# Patient Record
Sex: Female | Born: 1960
Health system: Southern US, Community
[De-identification: ages and names within clinical notes are randomized; demographics above are authoritative.]

## PROBLEM LIST (undated history)

## (undated) DIAGNOSIS — F99 Mental disorder, not otherwise specified: Secondary | ICD-10-CM

## (undated) DIAGNOSIS — Z8719 Personal history of other diseases of the digestive system: Secondary | ICD-10-CM

## (undated) DIAGNOSIS — N182 Chronic kidney disease, stage 2 (mild): Secondary | ICD-10-CM

## (undated) DIAGNOSIS — R519 Headache, unspecified: Secondary | ICD-10-CM

## (undated) DIAGNOSIS — R197 Diarrhea, unspecified: Secondary | ICD-10-CM

## (undated) DIAGNOSIS — K859 Acute pancreatitis without necrosis or infection, unspecified: Secondary | ICD-10-CM

## (undated) DIAGNOSIS — D649 Anemia, unspecified: Secondary | ICD-10-CM

## (undated) DIAGNOSIS — M51369 Other intervertebral disc degeneration, lumbar region without mention of lumbar back pain or lower extremity pain: Secondary | ICD-10-CM

## (undated) DIAGNOSIS — M71311 Other bursal cyst, right shoulder: Secondary | ICD-10-CM

## (undated) DIAGNOSIS — M317 Microscopic polyangiitis: Secondary | ICD-10-CM

## (undated) DIAGNOSIS — G4730 Sleep apnea, unspecified: Secondary | ICD-10-CM

## (undated) DIAGNOSIS — M75121 Complete rotator cuff tear or rupture of right shoulder, not specified as traumatic: Secondary | ICD-10-CM

## (undated) DIAGNOSIS — N179 Acute kidney failure, unspecified: Secondary | ICD-10-CM

## (undated) DIAGNOSIS — R5383 Other fatigue: Secondary | ICD-10-CM

## (undated) DIAGNOSIS — D039 Melanoma in situ, unspecified: Secondary | ICD-10-CM

## (undated) DIAGNOSIS — F419 Anxiety disorder, unspecified: Secondary | ICD-10-CM

## (undated) DIAGNOSIS — M5136 Other intervertebral disc degeneration, lumbar region: Secondary | ICD-10-CM

## (undated) DIAGNOSIS — M353 Polymyalgia rheumatica: Secondary | ICD-10-CM

## (undated) DIAGNOSIS — J45909 Unspecified asthma, uncomplicated: Secondary | ICD-10-CM

## (undated) DIAGNOSIS — J189 Pneumonia, unspecified organism: Secondary | ICD-10-CM

## (undated) DIAGNOSIS — K55039 Acute (reversible) ischemia of large intestine, extent unspecified: Secondary | ICD-10-CM

## (undated) DIAGNOSIS — M1990 Unspecified osteoarthritis, unspecified site: Secondary | ICD-10-CM

## (undated) DIAGNOSIS — I10 Essential (primary) hypertension: Secondary | ICD-10-CM

## (undated) DIAGNOSIS — E119 Type 2 diabetes mellitus without complications: Secondary | ICD-10-CM

## (undated) DIAGNOSIS — K219 Gastro-esophageal reflux disease without esophagitis: Secondary | ICD-10-CM

## (undated) DIAGNOSIS — R51 Headache: Secondary | ICD-10-CM

## (undated) DIAGNOSIS — I440 Atrioventricular block, first degree: Secondary | ICD-10-CM

## (undated) DIAGNOSIS — M792 Neuralgia and neuritis, unspecified: Secondary | ICD-10-CM

## (undated) DIAGNOSIS — M797 Fibromyalgia: Secondary | ICD-10-CM

## (undated) DIAGNOSIS — G8929 Other chronic pain: Secondary | ICD-10-CM

## (undated) DIAGNOSIS — C44519 Basal cell carcinoma of skin of other part of trunk: Secondary | ICD-10-CM

## (undated) HISTORY — DX: Acute pancreatitis without necrosis or infection, unspecified: K85.90

## (undated) HISTORY — PX: SHOULDER ARTHROSCOPY WITH ROTATOR CUFF REPAIR AND OPEN BICEPS TENODESIS: SHX6677

## (undated) HISTORY — PX: CATARACT EXTRACTION, BILATERAL: SHX1313

## (undated) HISTORY — DX: Diarrhea, unspecified: R19.7

## (undated) HISTORY — PX: CATARACT EXTRACTION: SUR2

## (undated) HISTORY — PX: RETINAL TEAR REPAIR CRYOTHERAPY: SHX5304

## (undated) HISTORY — PX: BASAL CELL CARCINOMA EXCISION: SHX1214

## (undated) SURGERY — EGD (ESOPHAGOGASTRODUODENOSCOPY)
Anesthesia: Moderate Sedation

---

## 1996-06-12 HISTORY — PX: NASAL SEPTOPLASTY W/ TURBINOPLASTY: SHX2070

## 1998-05-05 ENCOUNTER — Other Ambulatory Visit: Admission: RE | Admit: 1998-05-05 | Discharge: 1998-05-05 | Payer: Self-pay | Admitting: Obstetrics & Gynecology

## 1999-11-11 ENCOUNTER — Other Ambulatory Visit: Admission: RE | Admit: 1999-11-11 | Discharge: 1999-11-11 | Payer: Self-pay | Admitting: Obstetrics & Gynecology

## 2002-01-10 HISTORY — PX: LAPAROSCOPIC CHOLECYSTECTOMY: SUR755

## 2003-03-26 ENCOUNTER — Other Ambulatory Visit: Admission: RE | Admit: 2003-03-26 | Discharge: 2003-03-26 | Payer: Self-pay | Admitting: Obstetrics and Gynecology

## 2003-07-24 ENCOUNTER — Encounter: Admission: RE | Admit: 2003-07-24 | Discharge: 2003-07-24 | Payer: Self-pay | Admitting: Family Medicine

## 2003-12-08 ENCOUNTER — Ambulatory Visit (HOSPITAL_COMMUNITY): Admission: RE | Admit: 2003-12-08 | Discharge: 2003-12-08 | Payer: Self-pay | Admitting: Emergency Medicine

## 2004-02-19 ENCOUNTER — Other Ambulatory Visit: Admission: RE | Admit: 2004-02-19 | Discharge: 2004-02-19 | Payer: Self-pay | Admitting: Obstetrics and Gynecology

## 2005-05-01 ENCOUNTER — Other Ambulatory Visit: Admission: RE | Admit: 2005-05-01 | Discharge: 2005-05-01 | Payer: Self-pay | Admitting: Obstetrics and Gynecology

## 2009-06-02 ENCOUNTER — Encounter: Admission: RE | Admit: 2009-06-02 | Discharge: 2009-08-31 | Payer: Self-pay | Admitting: Orthopaedic Surgery

## 2009-06-12 DIAGNOSIS — M792 Neuralgia and neuritis, unspecified: Secondary | ICD-10-CM

## 2009-06-12 HISTORY — DX: Neuralgia and neuritis, unspecified: M79.2

## 2009-08-24 ENCOUNTER — Encounter: Admission: RE | Admit: 2009-08-24 | Discharge: 2009-08-24 | Payer: Self-pay

## 2009-09-27 ENCOUNTER — Encounter: Admission: RE | Admit: 2009-09-27 | Discharge: 2009-10-27 | Payer: Self-pay | Admitting: Orthopaedic Surgery

## 2010-01-10 ENCOUNTER — Encounter: Admission: RE | Admit: 2010-01-10 | Discharge: 2010-04-10 | Payer: Self-pay | Admitting: Orthopaedic Surgery

## 2010-03-21 ENCOUNTER — Ambulatory Visit (HOSPITAL_COMMUNITY): Admission: RE | Admit: 2010-03-21 | Discharge: 2010-03-21 | Payer: Self-pay | Admitting: Family Medicine

## 2010-03-21 ENCOUNTER — Encounter: Payer: Self-pay | Admitting: Family Medicine

## 2010-03-21 ENCOUNTER — Ambulatory Visit: Payer: Self-pay | Admitting: Vascular Surgery

## 2010-03-29 ENCOUNTER — Encounter: Admission: RE | Admit: 2010-03-29 | Discharge: 2010-03-29 | Payer: Self-pay

## 2010-04-11 ENCOUNTER — Encounter: Admission: RE | Admit: 2010-04-11 | Discharge: 2010-04-19 | Payer: Self-pay | Admitting: Orthopaedic Surgery

## 2010-05-04 ENCOUNTER — Ambulatory Visit (HOSPITAL_COMMUNITY): Admission: RE | Admit: 2010-05-04 | Discharge: 2010-05-04 | Payer: Self-pay | Admitting: Gastroenterology

## 2010-08-29 ENCOUNTER — Ambulatory Visit: Payer: Medicare Other | Attending: Orthopaedic Surgery | Admitting: Physical Therapy

## 2010-08-29 DIAGNOSIS — M25619 Stiffness of unspecified shoulder, not elsewhere classified: Secondary | ICD-10-CM | POA: Insufficient documentation

## 2010-08-29 DIAGNOSIS — M25519 Pain in unspecified shoulder: Secondary | ICD-10-CM | POA: Insufficient documentation

## 2010-08-29 DIAGNOSIS — IMO0001 Reserved for inherently not codable concepts without codable children: Secondary | ICD-10-CM | POA: Insufficient documentation

## 2010-09-01 ENCOUNTER — Encounter: Payer: Self-pay | Admitting: Physical Therapy

## 2010-09-06 ENCOUNTER — Encounter: Payer: Self-pay | Admitting: Physical Therapy

## 2010-09-07 ENCOUNTER — Ambulatory Visit: Payer: Medicare Other | Attending: Orthopaedic Surgery | Admitting: Physical Therapy

## 2010-09-07 DIAGNOSIS — M25519 Pain in unspecified shoulder: Secondary | ICD-10-CM | POA: Insufficient documentation

## 2010-09-07 DIAGNOSIS — IMO0001 Reserved for inherently not codable concepts without codable children: Secondary | ICD-10-CM | POA: Insufficient documentation

## 2010-09-07 DIAGNOSIS — M25619 Stiffness of unspecified shoulder, not elsewhere classified: Secondary | ICD-10-CM | POA: Insufficient documentation

## 2010-09-08 ENCOUNTER — Encounter: Payer: Self-pay | Admitting: Physical Therapy

## 2010-09-08 ENCOUNTER — Ambulatory Visit: Payer: Medicare Other | Admitting: Physical Therapy

## 2010-09-11 ENCOUNTER — Inpatient Hospital Stay (HOSPITAL_COMMUNITY)
Admission: EM | Admit: 2010-09-11 | Discharge: 2010-09-17 | DRG: 392 | Disposition: A | Discharge status: Home / Self Care | Payer: Medicare Other | Attending: Family Medicine | Admitting: Family Medicine

## 2010-09-11 ENCOUNTER — Emergency Department (HOSPITAL_COMMUNITY): Payer: Medicare Other

## 2010-09-11 DIAGNOSIS — K219 Gastro-esophageal reflux disease without esophagitis: Secondary | ICD-10-CM | POA: Diagnosis present

## 2010-09-11 DIAGNOSIS — E860 Dehydration: Secondary | ICD-10-CM | POA: Diagnosis present

## 2010-09-11 DIAGNOSIS — G43909 Migraine, unspecified, not intractable, without status migrainosus: Secondary | ICD-10-CM | POA: Diagnosis not present

## 2010-09-11 DIAGNOSIS — K449 Diaphragmatic hernia without obstruction or gangrene: Secondary | ICD-10-CM | POA: Diagnosis present

## 2010-09-11 DIAGNOSIS — K861 Other chronic pancreatitis: Secondary | ICD-10-CM | POA: Diagnosis present

## 2010-09-11 DIAGNOSIS — G4730 Sleep apnea, unspecified: Secondary | ICD-10-CM | POA: Diagnosis present

## 2010-09-11 DIAGNOSIS — I10 Essential (primary) hypertension: Secondary | ICD-10-CM | POA: Diagnosis present

## 2010-09-11 DIAGNOSIS — K648 Other hemorrhoids: Secondary | ICD-10-CM | POA: Diagnosis present

## 2010-09-11 DIAGNOSIS — D126 Benign neoplasm of colon, unspecified: Secondary | ICD-10-CM | POA: Diagnosis present

## 2010-09-11 DIAGNOSIS — Z79899 Other long term (current) drug therapy: Secondary | ICD-10-CM

## 2010-09-11 DIAGNOSIS — K589 Irritable bowel syndrome without diarrhea: Secondary | ICD-10-CM | POA: Diagnosis present

## 2010-09-11 DIAGNOSIS — A088 Other specified intestinal infections: Principal | ICD-10-CM | POA: Diagnosis present

## 2010-09-11 DIAGNOSIS — G5690 Unspecified mononeuropathy of unspecified upper limb: Secondary | ICD-10-CM | POA: Diagnosis present

## 2010-09-11 DIAGNOSIS — F411 Generalized anxiety disorder: Secondary | ICD-10-CM | POA: Diagnosis present

## 2010-09-11 DIAGNOSIS — F4310 Post-traumatic stress disorder, unspecified: Secondary | ICD-10-CM | POA: Diagnosis present

## 2010-09-11 DIAGNOSIS — K3184 Gastroparesis: Secondary | ICD-10-CM | POA: Diagnosis present

## 2010-09-11 LAB — DIFFERENTIAL
Basophils Relative: 0 % (ref 0–1)
Lymphs Abs: 0.5 10*3/uL — ABNORMAL LOW (ref 0.7–4.0)
Monocytes Absolute: 0.3 10*3/uL (ref 0.1–1.0)
Monocytes Relative: 2 % — ABNORMAL LOW (ref 3–12)
Neutro Abs: 15.7 10*3/uL — ABNORMAL HIGH (ref 1.7–7.7)

## 2010-09-11 LAB — COMPREHENSIVE METABOLIC PANEL
ALT: 18 U/L (ref 0–35)
AST: 27 U/L (ref 0–37)
Albumin: 3.4 g/dL — ABNORMAL LOW (ref 3.5–5.2)
Alkaline Phosphatase: 46 U/L (ref 39–117)
BUN: 18 mg/dL (ref 6–23)
CO2: 19 mEq/L (ref 19–32)
Calcium: 8.3 mg/dL — ABNORMAL LOW (ref 8.4–10.5)
Chloride: 105 mEq/L (ref 96–112)
Creatinine, Ser: 1.16 mg/dL (ref 0.4–1.2)
GFR calc Af Amer: >60 mL/min (ref >60)
GFR calc non Af Amer: 50 mL/min — ABNORMAL LOW (ref >60)
Glucose, Bld: 135 mg/dL — ABNORMAL HIGH (ref 70–99)
Potassium: 3.8 mEq/L (ref 3.5–5.1)
Sodium: 133 mEq/L — ABNORMAL LOW (ref 135–145)
Total Bilirubin: 0.5 mg/dL (ref 0.3–1.2)
Total Protein: 6.9 g/dL (ref 6.0–8.3)

## 2010-09-11 LAB — CBC
Hemoglobin: 13.7 g/dL (ref 12.0–15.0)
MCH: 29.8 pg (ref 26.0–34.0)
MCHC: 33.7 g/dL (ref 30.0–36.0)
MCV: 88.5 fL (ref 78.0–100.0)
RBC: 4.59 MIL/uL (ref 3.87–5.11)

## 2010-09-11 LAB — LIPASE, BLOOD: Lipase: 33 U/L (ref 11–59)

## 2010-09-11 MED ORDER — IOHEXOL 300 MG/ML  SOLN
100.0000 mL | Freq: Once | INTRAMUSCULAR | Status: AC | PRN
  Administered 2010-09-11: 100 mL via INTRAVENOUS

## 2010-09-12 DIAGNOSIS — E860 Dehydration: Secondary | ICD-10-CM

## 2010-09-12 DIAGNOSIS — K3184 Gastroparesis: Secondary | ICD-10-CM

## 2010-09-12 DIAGNOSIS — G43109 Migraine with aura, not intractable, without status migrainosus: Secondary | ICD-10-CM

## 2010-09-12 DIAGNOSIS — A09 Infectious gastroenteritis and colitis, unspecified: Secondary | ICD-10-CM

## 2010-09-12 LAB — URINALYSIS, ROUTINE W REFLEX MICROSCOPIC
Glucose, UA: NEGATIVE mg/dL
Ketones, ur: NEGATIVE mg/dL
Nitrite: NEGATIVE
Protein, ur: NEGATIVE mg/dL
Urobilinogen, UA: 0.2 mg/dL (ref 0.0–1.0)

## 2010-09-12 LAB — POCT PREGNANCY, URINE: Preg Test, Ur: NEGATIVE

## 2010-09-12 LAB — LACTIC ACID, PLASMA: Lactic Acid, Venous: 1.1 mmol/L (ref 0.5–2.2)

## 2010-09-13 ENCOUNTER — Encounter: Payer: Self-pay | Admitting: Physical Therapy

## 2010-09-13 LAB — BASIC METABOLIC PANEL
BUN: 10 mg/dL (ref 6–23)
CO2: 22 mEq/L (ref 19–32)
Calcium: 8.6 mg/dL (ref 8.4–10.5)
Chloride: 111 mEq/L (ref 96–112)
Creatinine, Ser: 1.03 mg/dL (ref 0.4–1.2)
GFR calc Af Amer: >60 mL/min (ref >60)

## 2010-09-13 LAB — FECAL LACTOFERRIN, QUANT

## 2010-09-13 LAB — GIARDIA/CRYPTOSPORIDIUM SCREEN(EIA)
Cryptosporidium Screen (EIA): NEGATIVE
Giardia Screen - EIA: NEGATIVE

## 2010-09-13 LAB — CBC
MCH: 28.7 pg (ref 26.0–34.0)
MCHC: 32.5 g/dL (ref 30.0–36.0)
MCV: 88.5 fL (ref 78.0–100.0)
Platelets: 269 10*3/uL (ref 150–400)
RBC: 4.07 MIL/uL (ref 3.87–5.11)

## 2010-09-15 ENCOUNTER — Other Ambulatory Visit: Payer: Self-pay | Admitting: Gastroenterology

## 2010-09-15 LAB — CBC
MCH: 28.8 pg (ref 26.0–34.0)
MCV: 88.6 fL (ref 78.0–100.0)
Platelets: 259 10*3/uL (ref 150–400)
RDW: 14.5 % (ref 11.5–15.5)

## 2010-09-15 LAB — BASIC METABOLIC PANEL
BUN: 3 mg/dL — ABNORMAL LOW (ref 6–23)
Calcium: 8.1 mg/dL — ABNORMAL LOW (ref 8.4–10.5)
Creatinine, Ser: 1.02 mg/dL (ref 0.4–1.2)
GFR calc Af Amer: >60 mL/min (ref >60)
GFR calc non Af Amer: 58 mL/min — ABNORMAL LOW (ref >60)

## 2010-09-16 LAB — STOOL CULTURE

## 2010-09-19 ENCOUNTER — Other Ambulatory Visit: Payer: Self-pay | Admitting: Gastroenterology

## 2010-09-19 DIAGNOSIS — R1011 Right upper quadrant pain: Secondary | ICD-10-CM

## 2010-09-20 ENCOUNTER — Encounter: Payer: Self-pay | Admitting: Physical Therapy

## 2010-09-21 ENCOUNTER — Other Ambulatory Visit: Payer: Medicare Other

## 2010-09-22 ENCOUNTER — Encounter: Payer: Self-pay | Admitting: Physical Therapy

## 2010-09-22 ENCOUNTER — Ambulatory Visit
Admission: RE | Admit: 2010-09-22 | Discharge: 2010-09-22 | Disposition: A | Discharge status: Home / Self Care | Payer: Medicare Other | Source: Ambulatory Visit | Attending: Gastroenterology | Admitting: Gastroenterology

## 2010-09-22 DIAGNOSIS — R1011 Right upper quadrant pain: Secondary | ICD-10-CM

## 2010-09-22 MED ORDER — IOHEXOL 300 MG/ML  SOLN: 125.0000 mL | Freq: Once | INTRAMUSCULAR | Status: AC | PRN

## 2010-09-26 NOTE — Discharge Summary (Signed)
NAMEJUPITER, BOYS NO.:  [id number]  MEDICAL RECORD NO.:  [id number]           PATIENT TYPE:  I  LOCATION:  5531                         FACILITY:  [facility]  PHYSICIAN:  Leighton Roach Vedanth Sirico, M.D.DATE OF BIRTH:  1960-07-15  DATE OF ADMISSION:  09/11/2010 DATE OF DISCHARGE:  09/17/2010                              DISCHARGE SUMMARY   PRIMARY CARE PROVIDER:  [facility].  DISCHARGE DIAGNOSES: 1. Viral gastroenteritis. 2. Irritable bowel syndrome. 3. Migraines. 4. Chronic pain. 5. Gastroesophageal reflux disease. 6. Hypertension. 7. Anxiety. 8. Chronic pancreatitis. 9. Gastroparesis. 10.Right arm rotator injury. 11.Sleep apnea.  DISCHARGE MEDICATIONS: 1. Albuterol inhaler p.r.n. 2. Allegra 180 p.o. daily. 3. Allopurinol 150 mg p.o. daily. 4. Creon 1 tablet p.o. 4 times a day. 5. Percocet 1 tablet p.o. q.4-6 p.r.n. pain. 6. Ibuprofen 600 mg p.o. q.4-6 h. p.r.n. pain. 7. Lasix 20 mg p.o. every other day. 8. Metoprolol 100 mg p.o. daily. 9. Morphine sulfate CR 30 mg p.o. t.i.d. 10.Nexium 40 mg p.o. b.i.d. 11.Provigil 1/2 tablet p.o. 4 times a day. 12.Provigil 200 mg p.o. twice a day. 13.Spironolactone 100 mg p.o. every morning. 14.Tekturna 300 mg p.o. daily. 15.Xanax 1 mg p.o. daily p.r.n. anxiety. 16.Azithromycin 500 mg 1 tablet p.o. daily.  CONSULTS:  [facility].  PROCEDURES: 1. CT abdomen and pelvis with contrast on April 1 show no acute     process in the abdomen or pelvis. 2. Colonoscopy performed by GI showing no acute abnormalities,     possibly consistent with IBS and viral gastroenteritis with all     benign biopsies  LABORATORY DATA:  On the day of admission, the patient's CBC showed a white count of 16.5, hemoglobin 13.7, platelets of 337,000 which had improved, white count of 4.1, hemoglobin of 10.6, and platelets of 259,000 prior to discharge.  The patient's initial BMET was within normal limits with the exception  of sodium of 133.  This had resolved prior to discharge.  The patient had a lipase of 33, lactic acid of 1.1, and negative urine pregnancy test, negative urinalysis, negative fecal C. diff, negative fecal Giardia, negative fecal Cryptosporidium.  In addition, the patient had a positive fecal lactoferrin and stool culture which showed only normal flora present.  BRIEF HOSPITAL COURSE:  This is a 50 year old female with extensive GI history presenting with nausea, vomiting, diarrhea, and right upper quadrant pain. 1. Nausea, vomiting, diarrhea.  Initial differential on admission was     broad. Given that stool, O and P, C. diff and culture were all     within normal limits, it was determined that the patient's nausea,     vomiting and more significantly diarrhea are likely secondary to a     viral gastroenteritis.  We did consult [facility] and greatly     appreciated their input and assistance in this patient's care.     Gastroenterology decided to perform a colonoscopy while the patient     was in the house which per their results did not show anything that     should be contributing to her diarrhea with the exception of some  possible IBS.  The patient's nausea and vomiting quickly improved,     however, the patient did continue having copious amounts of     diarrhea throughout the hospital stay which had greatly resolved     prior to discharge. The patient did initially require IV fluids and     was started on a 3-day course of antibiotics while awaiting stool     culture and O and P results.  On the day of discharge, the patient     was tolerating p.o. very well, had remained afebrile, had had     negative GI workup and greatly decreased amount of diarrhea 2. Initially the patient was going to be discharged on April 6 as her     GI complaints had greatly improved, however, the patient woke up     with a severe migraine which appeared to initially be resistant to     ibuprofen,  Xanax, and IV morphine.  Finally the patient was given     larger doses of IV morphine which helped to break her headache     cycle.  Her headache did resolve by late in the afternoon or early     evening and the patient was kept overnight just to continue     monitoring.  On the day of discharge, the patient was headache free     and felt able to go home. 3. Chronic pain.  The patient was continued on her home p.o. extended     release morphine and was given IV morphine p.r.n. breakthrough     pain. 4. Hypertension.  The patient remained normotensive while in-house on     her home medications and they were held.  This is likely due to the     fact that the patient was dehydrated, however, the patient was told     to restart her home medications at discharge. 5. Anxiety.  The patient was continued on Xanax p.r.n.  FOLLOWUP:  The patient is to follow up at [facility].  She is to call to be seen in the next 1-2 weeks.  In addition, the patient is to follow up with Dr. Madilyn Fireman with [facility] as recommended by them.  DISCHARGE CONDITION:  The patient was discharged home in stable medical condition.    ______________________________ Demetria Pore, MD   ______________________________ Leighton Roach Henley Blyth, M.D.    JM/MEDQ  D:  09/19/2010  T:  09/20/2010  Job:  [id number]  cc:   [facility] John C. Madilyn Fireman, M.D.  Electronically Signed by Demetria Pore MD on 09/20/2010 10:55:34 PM Electronically Signed by Acquanetta Belling M.D. on 09/26/2010 09:38:05 AM

## 2010-09-27 ENCOUNTER — Ambulatory Visit: Payer: Medicare Other | Attending: Orthopaedic Surgery | Admitting: Physical Therapy

## 2010-09-27 DIAGNOSIS — IMO0001 Reserved for inherently not codable concepts without codable children: Secondary | ICD-10-CM | POA: Insufficient documentation

## 2010-09-27 DIAGNOSIS — M25619 Stiffness of unspecified shoulder, not elsewhere classified: Secondary | ICD-10-CM | POA: Insufficient documentation

## 2010-09-27 DIAGNOSIS — M25519 Pain in unspecified shoulder: Secondary | ICD-10-CM | POA: Insufficient documentation

## 2010-09-29 ENCOUNTER — Ambulatory Visit: Payer: Medicare Other | Admitting: Physical Therapy

## 2010-10-02 NOTE — Op Note (Signed)
  NAME:  Valerie [name], Valerie [name] NO.:  [id number]  MEDICAL RECORD NO.:  [id number]          PATIENT TYPE:  LOCATION:                                 FACILITY:  PHYSICIAN:  Shirley Friar, MDDATE OF BIRTH:  [birth year]  DATE OF PROCEDURE: DATE OF DISCHARGE:                              OPERATIVE REPORT   INDICATIONS:  Abdominal pain, nausea, vomiting, and diarrhea.  MEDICATIONS:  Fentanyl 175 mcg IV, Versed 17.5 mg IV, and Phenergan 18.75 mg IV.  FINDINGS:  Rectal exam was unremarkable.  A pediatric colonoscope was inserted into a well-prepped colon and advanced to the cecum where ileocecal valve and appendiceal orifice were identified.  The patient did have discomfort during the procedure and loop reduction and increased sedation was necessary.  The terminal ileum was intubated and was normal in appearance.  Biopsies were taken in the terminal ileum for histologic purposes.  On careful withdrawal of the colonoscope, the colonic mucosa was unremarkable.  In the proximal descending colon was a 2-mm semi-sessile polyp that was removed with cold biopsy.  Random biopsies were taken throughout the colon for histologic purposes. Retroflexion was done which revealed hypertrophy, anal papillae, and small internal hemorrhoids.  ASSESSMENT: 1. Small internal hemorrhoids. 2. Small colon polyp. 3. Biopsies for histologic purposes as stated above and colon polyp     biopsy as well as stated above.  PLAN: 1. Follow up on path. 2. Proceed with upper endoscopy.     Shirley Friar, MD     VCS/MEDQ  D:  09/15/2010  T:  09/16/2010  Job:  [id number]  cc:   Everardo All. Madilyn Fireman, M.D.  Electronically Signed by Charlott Rakes MD on 10/02/2010 11:46:42 AM

## 2010-10-02 NOTE — Consult Note (Signed)
NAME:  CLOVER, FEEHAN NO.:  [id number]  MEDICAL RECORD NO.:  [id number]           PATIENT TYPE:  I  LOCATION:  5531                         FACILITY:  [facility]  PHYSICIAN:  Shirley Friar, MDDATE OF BIRTH:  [birth year]  DATE OF CONSULTATION:  09/14/2010 DATE OF DISCHARGE:                                CONSULTATION   REQUESTING PHYSICIAN:  Leighton Roach McDiarmid, MD  INDICATIONS:  Nausea, vomiting, diarrhea, and right upper quadrant pain.  HISTORY OF PRESENT ILLNESS:  Ms. Rhames is a pleasant 50 year old white female with a history of gastroparesis on azithromycin per her report for gastroparesis.  She developed severe nausea, vomiting, and diarrhea Sunday morning and then the nausea and vomiting resolved, but the diarrhea persisted, which was watery.  She does have a history of pancreatitis, but denies any epigastric pain stating that she has been having right-sided abdominal pain mainly in right upper quadrant, but also in the right lower quadrant.  The last food she ate prior to the onset of this pain was chilly.  She had an upper endoscopy in November 2011 by Dr. Madilyn Fireman.  On presentation, CAT scan was done, which showed no acute process in the abdomen.  She did have leukocytosis with a left shift with white blood count of 16.5, hemoglobin 13.7, platelet count 337 with 95% neutrophils.  Lipase level was 33.  Urine test was negative.  Stool studies have been negative to date including a negative C. difficile PCR test.  She reports having a colonoscopy a year ago in [place], which showed "inflammatory patches" in her colon.  She also reports having upper endoscopy at that time, which showed some inflammation in her stomach.  She reports having an intestinal metaplasia on an upper endoscopy done by Dr. Madilyn Fireman.  PAST MEDICAL HISTORY: 1. Gastroparesis. 2. Gastroesophageal reflux disease. 3. History of pancreatitis. 4. Hypertension. 5. History of anxiety. 6.  Post-traumatic stress disorder. 7. Sleep apnea. 8. Morbid obesity. 9. Status post cholecystectomy. 10.History of right arm rotator cuff surgery.  CURRENT MEDICATIONS: 1. Creon. 2. IV Cipro. 3. Droperidol. 4. Subcu heparin. 5. P.o. Flagyl. 6. P.o. morphine. 7. P.o. Protonix, doses listed in hospital record.  Additional medicines given as needed.  ALLERGIES:  REGLAN, HYDROMORPHONE.  FAMILY HISTORY:  Noncontributory.  SOCIAL HISTORY:  Denies tobacco, alcohol, or drugs.  REVIEW OF SYSTEMS:  Negative from GI standpoint except as stated above.  PHYSICAL EXAMINATION:  VITAL SIGNS:  Temperature 98, pulse 79, blood pressure 110/82. GENERAL:  Alert, no acute distress.  Morbidly obese. ABDOMEN:  Tender in the right upper quadrant and right mid quadrant with guarding, otherwise nontender, soft, nondistended, positive bowel sounds.  LABORATORY DATA:  White blood count 5.6 down from 16.5 on September 11, 2010, hemoglobin 11.7, platelet count 269.  BUN 10, creatinine 1.03, lipase 33.  Additional labs noted and listed in hospital record.  C. diff negative.  Giardia negative.  Cryptosporidium negative.  Stool culture no growth to date.  Fecal lactoferrin positive.  IMPRESSION:  A 50 year old white female with gastroparesis presenting with acute onset of nausea, vomiting, watery diarrhea, and right-sided abdominal pain with a negative  CT scan.  I doubt this is an inflammatory bowel disease process with acute onset and suspect this is either functional or infectious.  We will do a colonoscopy and upper endoscopy with biopsies to further evaluate her symptoms.  We will prep today, clear liquid diet, n.p.o. after midnight to do procedures tomorrow.     Shirley Friar, MD     VCS/MEDQ  D:  09/14/2010  T:  09/15/2010  Job:  [id number]  cc:   Everardo All. Madilyn Fireman, M.D.  Electronically Signed by Charlott Rakes MD on 10/02/2010 11:46:25 AM

## 2010-10-02 NOTE — Op Note (Signed)
  NAME:  SAMYRA, LIMB NO.:  [id number]  MEDICAL RECORD NO.:  [id number]           PATIENT TYPE:  I  LOCATION:  5531                         FACILITY:  [facility]  PHYSICIAN:  Shirley Friar, MDDATE OF BIRTH:  09-30-1960  DATE OF PROCEDURE: DATE OF DISCHARGE:                              OPERATIVE REPORT   INDICATION:  Diarrhea, abdominal pain, nausea, and vomiting.  MEDICATIONS:  No additional medicine given for upper endoscopy.  See colonoscopy report for medication totals.  FINDINGS:  Endoscope was inserted through oropharynx and esophagus was intubated which was normal in its entirety.  Endoscope was advanced down to the stomach which revealed normal-appearing gastric ulcer. Retroflexion was done which revealed small hiatal hernia.  Endoscope was straightened and advanced into the duodenal bulb and second portion of duodenum which were unremarkable.  Biopsies were taken in the second portion of duodenum and duodenal bulb for histologic purposes.  ASSESSMENT: 1. Small hiatal hernia, otherwise normal upper endoscopy. 2. Status post biopsies of duodenum for histologic purposes.  PLAN: 1. Follow up on path. 2. Advance diet as tolerated.     Shirley Friar, MD     VCS/MEDQ  D:  09/15/2010  T:  09/16/2010  Job:  [id number]  cc:   Everardo All. Madilyn Fireman, M.D. Leighton Roach McDiarmid, M.D.  Electronically Signed by Charlott Rakes MD on 10/02/2010 11:46:35 AM

## 2010-10-03 NOTE — H&P (Signed)
NAME:  Valerie [name], Valerie [name] NO.:  [id number]  MEDICAL RECORD NO.:  [id number]           PATIENT TYPE:  I  LOCATION:  5531                         FACILITY:  [facility]  PHYSICIAN:  Leighton Roach [place], M.D.DATE OF BIRTH:  1960/06/29  DATE OF ADMISSION:  09/11/2010 DATE OF DISCHARGE:                             HISTORY & PHYSICAL   PRIMARY CARE PHYSICIAN:  [facility].  CHIEF COMPLAINT:  Nausea, vomit, diarrhea, and right upper quadrant pain.  HISTORY OF PRESENT ILLNESS:  The patient is a 50 year old female who presented to the ER with nausea, vomiting, and diarrhea.  The patient reports positive fatigue on Saturday.  On Sunday at 2:30 a.m., began to have nausea, at 6 a.m. began to have nausea, vomiting, and diarrhea.  At 2 p.m. nausea and vomiting had resolved, but diarrhea persisted, went to [facility].  White blood cell was elevated.  Systolic blood pressure in the 90s per patient and persistent fatigue.  The patient was sent to the emergency department.  In the ER was evaluated x16 hours, felt multiple p.o. clears trial, but able to control nausea and diarrhea with Zofran and IV fluids, had large amount of diarrhea since 8 a.m. approximately 8 liters.  The patient has chronic right upper quad pain which had been worsened since onset of symptoms on Sunday.  REVIEW OF SYSTEMS:  Positive dark stools, positive nausea, positive dizziness, positive lightheadedness both which improved with IV fluids at [facility].  No bloody stool, no fever, no cold symptoms, no chest pain, no shortness of breath.  ALLERGIES:  REGLAN makes "jittery inside.".  MEDICATIONS: 1. Morphine 30 mg ER t.i.d. 2. Tekturna 300 mg p.o. daily. 3. Albuterol p.r.n. 4. Allopurinol 450 mg p.o. daily. 5. Flexeril p.r.n. 6. Lasix 20 mg every other day. 7. Provigil as directed. 8. Spironolactone 100 mg daily. 9. Toprol 100 mg daily. 10.Xanax 1 mg p.o. p.r.n. 11.Zithromax 250 mg,  the patient unsure of dosage of frequency. 12.Nexium 40 mg p.o. b.i.d. 13.Creon with meals and at bedtime.  The patient not sure of above     dosages.  We will confirm above meds and dosages with pharmacy     reconciliation form.  PAST MEDICAL HISTORY AND SURGICAL HISTORY: 1. Cholecystectomy in 2003. 2. Hypertension. 3. Gastroparesis followed by Dr. Madilyn Fireman. 4. GERD. 5. Pancreatitis. 6. Right arm rotator injury, nerve injury from surgery, has been on     chronic morphine since nerve injury. 7. PTSD anxiety. 8. SI joint lumbar pain. 9. Sleep apnea.  FAMILY HISTORY:  Mother aortic valve replacement, kidney problems, COPD, hypertension.  Father hypertension, lung cancer small cell, MI. Siblings healthy.  SOCIAL HISTORY:  No well water.  No recent travel.  No tobacco, no drugs, no alcohol.  Patient has her undergraduate education in Physics. Her Master is in Personnel officer and is currently enrolled in the PhD program in the school of Nurse, mental health.  She denies tobacco, no drugs, no alcohol.  She does not have well water.  She denies any recent travel.  PHYSICAL EXAMINATION:  VITAL SIGNS:  Temperature 98.5, blood pressure 130/82, heart rate 104,  respiratory rate 18, pulse ox 100% on room air. GENERAL:  No acute distress, resting quietly in bed. HEENT:  Pupils equal, round, and reactive to light accommodation.  No cervical lymphadenopathy. RESPIRATORY:  Clear to auscultation bilateral.  No wheezing. CARDIOVASCULAR:  Regular rate and rhythm.  No murmurs, rubs, or gallops. ABDOMEN:  Soft, nontender, nondistended.  Positive tenderness to palpation in right upper quadrant, but no tenderness in the rest of abdomen.  No rebound, no guarding. EXTREMITIES:  No lower extremity edema. NEUROLOGIC:  Alert and oriented x3, moving all four extremities 5/5 strength.  Right arm strength less than left arm strength and this is per patient baseline secondary to right arm nerve injury as  stated in the past medical history.  LABS AND STUDIES:  UA negative leukocyte esterase, negative nitrite, negative hemoglobin, and urine preg negative.  CT abdomen and pelvis with contrast, no acute process demonstrated in the abdomen and pelvis. Sodium 133, potassium 3.8, chloride 105, BUN 18, creatinine 1.16, glucose 135, AST is 27, ALT 18, lipase 33.  White blood cells 16.5, hemoglobin 13.7, platelets 337.  ASSESSMENT AND PLAN:  The patient is a 50 year old female admitted for nausea, vomiting, diarrhea, and right upper quadrant pain. 1. Nausea, vomiting.  No vomiting this a.m., positive nausea, we will     treat with Zofran and Phenergan p.r.n.  We will give IV fluids     normal saline 125 mL per hour until able to tolerate p.o. fluids.     We will have the patient attempt p.o. clears unsure of etiology,     infectious versus gastroparesis almost likely. 2. Diarrhea.  We will give normal saline 125 mL per hour.  We will     treat for infectious causes Cipro and Zosyn.  Stool lactoferrin     pending.  Stool cultures pending.  Stool O and P pending.  C. diff     PCR pending. 3. Right upper quadrant abdominal pain, gastroparesis versus     infectious cause, most likely we will treat with home med regimen     of morphine ER 30 mg t.i.d. and with morphine 2 mg q.2 hours p.r.n.     CT of abdomen and pelvis within normal limits.  We will treat for     infection causes for #2. 4. GERD.  We will restart Nexium when tolerating p.o.'s could consider     IV H2 blocker if symptoms do not relieve with above plan. 5. Hypertension.  We will restart blood pressure meds and patient     tolerating p.o.'s, we will monitor blood pressure closely and     control metformin or hydralazine p.r.n. if any elevation is greater     than 180 systolic. 6. Chronic pain, right arm neuropathy.  We will treat pain with home     pain med regimen as described in #2. 7. Anxiety, posttraumatic stress disorder.  We  will continue home meds     of Xanax p.r.n. 8. FEN/GI:  IV normal saline 125 mL/hour and clears p.o. 9. Prophylaxis.  Heparin 5000 units t.i.d. 10.Dispo, pending clinical improvement.     Ellin Mayhew, MD   ______________________________ Leighton Roach Zyair Rhein, M.D.    DC/MEDQ  D:  09/12/2010  T:  09/13/2010  Job:  [id number]  Electronically Signed by Ellin Mayhew  on 09/27/2010 02:29:42 PM Electronically Signed by Acquanetta Belling M.D. on 10/03/2010 07:35:39 AM

## 2010-10-04 ENCOUNTER — Encounter: Payer: No Typology Code available for payment source | Admitting: Physical Therapy

## 2010-10-06 ENCOUNTER — Ambulatory Visit: Payer: Medicare Other | Admitting: Physical Therapy

## 2010-10-10 ENCOUNTER — Ambulatory Visit: Payer: Medicare Other | Admitting: Physical Therapy

## 2011-02-22 ENCOUNTER — Ambulatory Visit: Payer: Medicare Other | Admitting: Physical Therapy

## 2011-05-21 ENCOUNTER — Ambulatory Visit (INDEPENDENT_AMBULATORY_CARE_PROVIDER_SITE_OTHER): Payer: Medicare Other

## 2011-05-21 DIAGNOSIS — F4321 Adjustment disorder with depressed mood: Secondary | ICD-10-CM

## 2011-05-21 DIAGNOSIS — R498 Other voice and resonance disorders: Secondary | ICD-10-CM

## 2011-06-07 ENCOUNTER — Ambulatory Visit: Payer: Medicare Other

## 2011-06-15 DIAGNOSIS — F4323 Adjustment disorder with mixed anxiety and depressed mood: Secondary | ICD-10-CM | POA: Diagnosis not present

## 2011-06-16 ENCOUNTER — Ambulatory Visit: Payer: Medicare Other | Admitting: Occupational Therapy

## 2011-06-16 ENCOUNTER — Ambulatory Visit: Payer: Medicare Other | Attending: Neurology | Admitting: Occupational Therapy

## 2011-06-16 DIAGNOSIS — Z5189 Encounter for other specified aftercare: Secondary | ICD-10-CM | POA: Insufficient documentation

## 2011-06-16 DIAGNOSIS — M25549 Pain in joints of unspecified hand: Secondary | ICD-10-CM | POA: Diagnosis not present

## 2011-06-16 DIAGNOSIS — M2560 Stiffness of unspecified joint, not elsewhere classified: Secondary | ICD-10-CM | POA: Insufficient documentation

## 2011-06-16 DIAGNOSIS — M545 Low back pain, unspecified: Secondary | ICD-10-CM | POA: Diagnosis not present

## 2011-06-16 DIAGNOSIS — M25519 Pain in unspecified shoulder: Secondary | ICD-10-CM | POA: Insufficient documentation

## 2011-06-16 DIAGNOSIS — M6281 Muscle weakness (generalized): Secondary | ICD-10-CM | POA: Diagnosis not present

## 2011-06-16 DIAGNOSIS — R293 Abnormal posture: Secondary | ICD-10-CM | POA: Diagnosis not present

## 2011-06-19 ENCOUNTER — Ambulatory Visit (INDEPENDENT_AMBULATORY_CARE_PROVIDER_SITE_OTHER): Payer: Medicare Other

## 2011-06-19 DIAGNOSIS — R209 Unspecified disturbances of skin sensation: Secondary | ICD-10-CM | POA: Diagnosis not present

## 2011-06-19 DIAGNOSIS — K3184 Gastroparesis: Secondary | ICD-10-CM | POA: Diagnosis not present

## 2011-06-19 DIAGNOSIS — R197 Diarrhea, unspecified: Secondary | ICD-10-CM | POA: Diagnosis not present

## 2011-06-21 ENCOUNTER — Ambulatory Visit: Payer: Medicare Other | Admitting: Occupational Therapy

## 2011-06-21 DIAGNOSIS — M25519 Pain in unspecified shoulder: Secondary | ICD-10-CM | POA: Diagnosis not present

## 2011-06-21 DIAGNOSIS — M545 Low back pain, unspecified: Secondary | ICD-10-CM | POA: Diagnosis not present

## 2011-06-21 DIAGNOSIS — M6281 Muscle weakness (generalized): Secondary | ICD-10-CM | POA: Diagnosis not present

## 2011-06-21 DIAGNOSIS — Z5189 Encounter for other specified aftercare: Secondary | ICD-10-CM | POA: Diagnosis not present

## 2011-06-21 DIAGNOSIS — M2560 Stiffness of unspecified joint, not elsewhere classified: Secondary | ICD-10-CM | POA: Diagnosis not present

## 2011-06-21 DIAGNOSIS — M25549 Pain in joints of unspecified hand: Secondary | ICD-10-CM | POA: Diagnosis not present

## 2011-06-22 ENCOUNTER — Ambulatory Visit: Payer: Medicare Other | Admitting: Physical Therapy

## 2011-06-22 DIAGNOSIS — F4323 Adjustment disorder with mixed anxiety and depressed mood: Secondary | ICD-10-CM | POA: Diagnosis not present

## 2011-06-26 ENCOUNTER — Ambulatory Visit: Payer: Medicare Other | Admitting: Physical Therapy

## 2011-06-26 DIAGNOSIS — M2560 Stiffness of unspecified joint, not elsewhere classified: Secondary | ICD-10-CM | POA: Diagnosis not present

## 2011-06-26 DIAGNOSIS — M6281 Muscle weakness (generalized): Secondary | ICD-10-CM | POA: Diagnosis not present

## 2011-06-26 DIAGNOSIS — M545 Low back pain, unspecified: Secondary | ICD-10-CM | POA: Diagnosis not present

## 2011-06-26 DIAGNOSIS — Z5189 Encounter for other specified aftercare: Secondary | ICD-10-CM | POA: Diagnosis not present

## 2011-06-26 DIAGNOSIS — M25549 Pain in joints of unspecified hand: Secondary | ICD-10-CM | POA: Diagnosis not present

## 2011-06-26 DIAGNOSIS — M25519 Pain in unspecified shoulder: Secondary | ICD-10-CM | POA: Diagnosis not present

## 2011-06-28 ENCOUNTER — Ambulatory Visit: Payer: Medicare Other | Admitting: Occupational Therapy

## 2011-06-28 ENCOUNTER — Ambulatory Visit: Payer: Medicare Other | Admitting: Physical Therapy

## 2011-06-28 DIAGNOSIS — M2560 Stiffness of unspecified joint, not elsewhere classified: Secondary | ICD-10-CM | POA: Diagnosis not present

## 2011-06-28 DIAGNOSIS — M25519 Pain in unspecified shoulder: Secondary | ICD-10-CM | POA: Diagnosis not present

## 2011-06-28 DIAGNOSIS — M545 Low back pain, unspecified: Secondary | ICD-10-CM | POA: Diagnosis not present

## 2011-06-28 DIAGNOSIS — Z5189 Encounter for other specified aftercare: Secondary | ICD-10-CM | POA: Diagnosis not present

## 2011-06-28 DIAGNOSIS — M6281 Muscle weakness (generalized): Secondary | ICD-10-CM | POA: Diagnosis not present

## 2011-06-28 DIAGNOSIS — M25549 Pain in joints of unspecified hand: Secondary | ICD-10-CM | POA: Diagnosis not present

## 2011-06-29 DIAGNOSIS — F4323 Adjustment disorder with mixed anxiety and depressed mood: Secondary | ICD-10-CM | POA: Diagnosis not present

## 2011-07-04 ENCOUNTER — Ambulatory Visit: Payer: Medicare Other | Admitting: Physical Therapy

## 2011-07-04 DIAGNOSIS — Z5189 Encounter for other specified aftercare: Secondary | ICD-10-CM | POA: Diagnosis not present

## 2011-07-04 DIAGNOSIS — M25549 Pain in joints of unspecified hand: Secondary | ICD-10-CM | POA: Diagnosis not present

## 2011-07-04 DIAGNOSIS — M545 Low back pain, unspecified: Secondary | ICD-10-CM | POA: Diagnosis not present

## 2011-07-04 DIAGNOSIS — M25519 Pain in unspecified shoulder: Secondary | ICD-10-CM | POA: Diagnosis not present

## 2011-07-04 DIAGNOSIS — M6281 Muscle weakness (generalized): Secondary | ICD-10-CM | POA: Diagnosis not present

## 2011-07-04 DIAGNOSIS — M2560 Stiffness of unspecified joint, not elsewhere classified: Secondary | ICD-10-CM | POA: Diagnosis not present

## 2011-07-06 ENCOUNTER — Ambulatory Visit: Payer: Medicare Other | Admitting: Physical Therapy

## 2011-07-06 DIAGNOSIS — F4323 Adjustment disorder with mixed anxiety and depressed mood: Secondary | ICD-10-CM | POA: Diagnosis not present

## 2011-07-06 DIAGNOSIS — M25549 Pain in joints of unspecified hand: Secondary | ICD-10-CM | POA: Diagnosis not present

## 2011-07-06 DIAGNOSIS — M25519 Pain in unspecified shoulder: Secondary | ICD-10-CM | POA: Diagnosis not present

## 2011-07-06 DIAGNOSIS — M2560 Stiffness of unspecified joint, not elsewhere classified: Secondary | ICD-10-CM | POA: Diagnosis not present

## 2011-07-06 DIAGNOSIS — Z5189 Encounter for other specified aftercare: Secondary | ICD-10-CM | POA: Diagnosis not present

## 2011-07-06 DIAGNOSIS — M545 Low back pain, unspecified: Secondary | ICD-10-CM | POA: Diagnosis not present

## 2011-07-06 DIAGNOSIS — M6281 Muscle weakness (generalized): Secondary | ICD-10-CM | POA: Diagnosis not present

## 2011-07-11 ENCOUNTER — Ambulatory Visit: Payer: Medicare Other | Admitting: Physical Therapy

## 2011-07-11 DIAGNOSIS — M545 Low back pain, unspecified: Secondary | ICD-10-CM | POA: Diagnosis not present

## 2011-07-11 DIAGNOSIS — M25549 Pain in joints of unspecified hand: Secondary | ICD-10-CM | POA: Diagnosis not present

## 2011-07-11 DIAGNOSIS — M6281 Muscle weakness (generalized): Secondary | ICD-10-CM | POA: Diagnosis not present

## 2011-07-11 DIAGNOSIS — F4323 Adjustment disorder with mixed anxiety and depressed mood: Secondary | ICD-10-CM | POA: Diagnosis not present

## 2011-07-11 DIAGNOSIS — Z5189 Encounter for other specified aftercare: Secondary | ICD-10-CM | POA: Diagnosis not present

## 2011-07-11 DIAGNOSIS — M2560 Stiffness of unspecified joint, not elsewhere classified: Secondary | ICD-10-CM | POA: Diagnosis not present

## 2011-07-11 DIAGNOSIS — M25519 Pain in unspecified shoulder: Secondary | ICD-10-CM | POA: Diagnosis not present

## 2011-07-13 ENCOUNTER — Ambulatory Visit: Payer: Medicare Other | Admitting: Physical Therapy

## 2011-07-13 DIAGNOSIS — Z5189 Encounter for other specified aftercare: Secondary | ICD-10-CM | POA: Diagnosis not present

## 2011-07-13 DIAGNOSIS — M545 Low back pain, unspecified: Secondary | ICD-10-CM | POA: Diagnosis not present

## 2011-07-13 DIAGNOSIS — M25549 Pain in joints of unspecified hand: Secondary | ICD-10-CM | POA: Diagnosis not present

## 2011-07-13 DIAGNOSIS — M2560 Stiffness of unspecified joint, not elsewhere classified: Secondary | ICD-10-CM | POA: Diagnosis not present

## 2011-07-13 DIAGNOSIS — M25519 Pain in unspecified shoulder: Secondary | ICD-10-CM | POA: Diagnosis not present

## 2011-07-13 DIAGNOSIS — M6281 Muscle weakness (generalized): Secondary | ICD-10-CM | POA: Diagnosis not present

## 2011-07-18 ENCOUNTER — Ambulatory Visit: Payer: Medicare Other | Attending: Orthopaedic Surgery | Admitting: Physical Therapy

## 2011-07-18 DIAGNOSIS — R293 Abnormal posture: Secondary | ICD-10-CM | POA: Insufficient documentation

## 2011-07-18 DIAGNOSIS — M25549 Pain in joints of unspecified hand: Secondary | ICD-10-CM | POA: Diagnosis not present

## 2011-07-18 DIAGNOSIS — Z5189 Encounter for other specified aftercare: Secondary | ICD-10-CM | POA: Diagnosis not present

## 2011-07-18 DIAGNOSIS — M2560 Stiffness of unspecified joint, not elsewhere classified: Secondary | ICD-10-CM | POA: Diagnosis not present

## 2011-07-18 DIAGNOSIS — M545 Low back pain, unspecified: Secondary | ICD-10-CM | POA: Insufficient documentation

## 2011-07-18 DIAGNOSIS — M25519 Pain in unspecified shoulder: Secondary | ICD-10-CM | POA: Diagnosis not present

## 2011-07-18 DIAGNOSIS — F4323 Adjustment disorder with mixed anxiety and depressed mood: Secondary | ICD-10-CM | POA: Diagnosis not present

## 2011-07-18 DIAGNOSIS — M6281 Muscle weakness (generalized): Secondary | ICD-10-CM | POA: Insufficient documentation

## 2011-07-20 ENCOUNTER — Ambulatory Visit: Payer: Medicare Other | Admitting: Physical Therapy

## 2011-07-21 DIAGNOSIS — M76899 Other specified enthesopathies of unspecified lower limb, excluding foot: Secondary | ICD-10-CM | POA: Diagnosis not present

## 2011-07-21 DIAGNOSIS — M2550 Pain in unspecified joint: Secondary | ICD-10-CM | POA: Diagnosis not present

## 2011-07-21 DIAGNOSIS — M533 Sacrococcygeal disorders, not elsewhere classified: Secondary | ICD-10-CM | POA: Diagnosis not present

## 2011-07-21 DIAGNOSIS — E559 Vitamin D deficiency, unspecified: Secondary | ICD-10-CM | POA: Diagnosis not present

## 2011-07-21 DIAGNOSIS — M19049 Primary osteoarthritis, unspecified hand: Secondary | ICD-10-CM | POA: Diagnosis not present

## 2011-07-21 DIAGNOSIS — Z79899 Other long term (current) drug therapy: Secondary | ICD-10-CM | POA: Diagnosis not present

## 2011-07-21 DIAGNOSIS — IMO0001 Reserved for inherently not codable concepts without codable children: Secondary | ICD-10-CM | POA: Diagnosis not present

## 2011-07-21 DIAGNOSIS — R5381 Other malaise: Secondary | ICD-10-CM | POA: Diagnosis not present

## 2011-07-24 ENCOUNTER — Encounter: Payer: Medicare Other | Admitting: Physical Therapy

## 2011-07-26 ENCOUNTER — Ambulatory Visit: Payer: Medicare Other | Admitting: Physical Therapy

## 2011-07-31 ENCOUNTER — Ambulatory Visit: Payer: Medicare Other | Admitting: Physical Therapy

## 2011-07-31 ENCOUNTER — Other Ambulatory Visit: Payer: Self-pay | Admitting: Family Medicine

## 2011-07-31 MED ORDER — METOPROLOL SUCCINATE ER 100 MG PO TB24: 100.0000 mg | ORAL_TABLET | Freq: Every day | ORAL | Status: DC

## 2011-07-31 MED ORDER — FUROSEMIDE 20 MG PO TABS: 20.0000 mg | ORAL_TABLET | Freq: Two times a day (BID) | ORAL | Status: DC

## 2011-07-31 MED ORDER — SPIRONOLACTONE 100 MG PO TABS: 100.0000 mg | ORAL_TABLET | Freq: Every day | ORAL | Status: DC

## 2011-08-01 ENCOUNTER — Ambulatory Visit (INDEPENDENT_AMBULATORY_CARE_PROVIDER_SITE_OTHER): Payer: Medicare Other | Admitting: Family Medicine

## 2011-08-01 VITALS — BP 123/79 | HR 85 | Temp 98.1°F | Resp 18 | Ht 66.5 in | Wt 309.0 lb

## 2011-08-01 DIAGNOSIS — F4323 Adjustment disorder with mixed anxiety and depressed mood: Secondary | ICD-10-CM | POA: Diagnosis not present

## 2011-08-01 DIAGNOSIS — F4310 Post-traumatic stress disorder, unspecified: Secondary | ICD-10-CM

## 2011-08-01 DIAGNOSIS — E790 Hyperuricemia without signs of inflammatory arthritis and tophaceous disease: Secondary | ICD-10-CM

## 2011-08-01 DIAGNOSIS — E669 Obesity, unspecified: Secondary | ICD-10-CM | POA: Diagnosis not present

## 2011-08-01 DIAGNOSIS — F419 Anxiety disorder, unspecified: Secondary | ICD-10-CM

## 2011-08-01 DIAGNOSIS — K635 Polyp of colon: Secondary | ICD-10-CM

## 2011-08-01 DIAGNOSIS — F411 Generalized anxiety disorder: Secondary | ICD-10-CM

## 2011-08-01 DIAGNOSIS — I10 Essential (primary) hypertension: Secondary | ICD-10-CM | POA: Diagnosis not present

## 2011-08-01 DIAGNOSIS — K589 Irritable bowel syndrome without diarrhea: Secondary | ICD-10-CM | POA: Insufficient documentation

## 2011-08-01 MED ORDER — SPIRONOLACTONE 100 MG PO TABS: 100.0000 mg | ORAL_TABLET | Freq: Every day | ORAL | Status: DC

## 2011-08-01 MED ORDER — ALPRAZOLAM 1 MG PO TABS: 1.0000 mg | ORAL_TABLET | ORAL | Status: DC | PRN

## 2011-08-01 MED ORDER — ALLOPURINOL 100 MG PO TABS: 100.0000 mg | ORAL_TABLET | Freq: Every day | ORAL | Status: DC

## 2011-08-01 MED ORDER — ALISKIREN FUMARATE 300 MG PO TABS: 300.0000 mg | ORAL_TABLET | Freq: Every day | ORAL | Status: DC

## 2011-08-01 MED ORDER — FUROSEMIDE 20 MG PO TABS: 20.0000 mg | ORAL_TABLET | Freq: Two times a day (BID) | ORAL | Status: DC

## 2011-08-01 MED ORDER — METOPROLOL SUCCINATE ER 100 MG PO TB24: 100.0000 mg | ORAL_TABLET | Freq: Every day | ORAL | Status: DC

## 2011-08-01 MED ORDER — LAMOTRIGINE 100 MG PO TABS: 50.0000 mg | ORAL_TABLET | Freq: Every day | ORAL | Status: DC

## 2011-08-01 NOTE — Progress Notes (Signed)
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 She's been in limbo as far as her studies go because of a professor not liking her study pattern. She had to appeal her suspension and, has one her appeal. She hopes to begin studies again this summer. This has been a tremendous relief for her.  I spent 30 minutes discussing her PTSD with Elease Hashimoto.  She relates normally today and seems happy with life. HEENT is normal.  Heart: Regular without murmur  Chest: Clear to auscultation  Extremities: No edema good range of motion  We reviewed her medications and refilled them. I discussed exercises and important strategy for good health and lowering blood pressure as well as managing the PTSD. I plan on seeing her back in 3 months.

## 2011-08-02 ENCOUNTER — Ambulatory Visit: Payer: Medicare Other | Admitting: Physical Therapy

## 2011-08-07 DIAGNOSIS — M79609 Pain in unspecified limb: Secondary | ICD-10-CM | POA: Diagnosis not present

## 2011-08-07 DIAGNOSIS — R209 Unspecified disturbances of skin sensation: Secondary | ICD-10-CM | POA: Diagnosis not present

## 2011-08-08 DIAGNOSIS — F4323 Adjustment disorder with mixed anxiety and depressed mood: Secondary | ICD-10-CM | POA: Diagnosis not present

## 2011-08-15 DIAGNOSIS — F4323 Adjustment disorder with mixed anxiety and depressed mood: Secondary | ICD-10-CM | POA: Diagnosis not present

## 2011-08-22 ENCOUNTER — Telehealth: Payer: Self-pay

## 2011-08-22 DIAGNOSIS — F4323 Adjustment disorder with mixed anxiety and depressed mood: Secondary | ICD-10-CM | POA: Diagnosis not present

## 2011-08-22 NOTE — Telephone Encounter (Signed)
Dr. Milus Glazier   Pt in a lot of pain - called back uric acid is caused by renewed script  [phone]

## 2011-08-22 NOTE — Telephone Encounter (Signed)
.  [facility] PT STATES THE OTHER DR USE TO GIVE HER 150MG S OF THE ALLOPURINOL, BUT DR KURT ONLY GAVE HER 50MG S AND NOW SHE KNOW WHY SHE IS IN A LOT OF PAIN PLEASE CALL IN THE 150MG S. AND YOU MAY REACH PT AT [phone]   [name] TE[facility] [place]

## 2011-08-23 ENCOUNTER — Other Ambulatory Visit: Payer: Self-pay | Admitting: Family Medicine

## 2011-08-23 MED ORDER — ALLOPURINOL 300 MG PO TABS: ORAL_TABLET | ORAL | Status: DC

## 2011-08-23 NOTE — Telephone Encounter (Signed)
Chart pulled to PA 

## 2011-08-23 NOTE — Telephone Encounter (Signed)
Dr. Milus Glazier - I put this pt's chart in your box.

## 2011-08-29 DIAGNOSIS — F4323 Adjustment disorder with mixed anxiety and depressed mood: Secondary | ICD-10-CM | POA: Diagnosis not present

## 2011-09-05 DIAGNOSIS — F4323 Adjustment disorder with mixed anxiety and depressed mood: Secondary | ICD-10-CM | POA: Diagnosis not present

## 2011-09-12 ENCOUNTER — Other Ambulatory Visit: Payer: Self-pay | Admitting: Physician Assistant

## 2011-09-18 DIAGNOSIS — J343 Hypertrophy of nasal turbinates: Secondary | ICD-10-CM | POA: Diagnosis not present

## 2011-09-18 DIAGNOSIS — M545 Low back pain, unspecified: Secondary | ICD-10-CM | POA: Diagnosis not present

## 2011-09-18 DIAGNOSIS — M79609 Pain in unspecified limb: Secondary | ICD-10-CM | POA: Diagnosis not present

## 2011-09-18 DIAGNOSIS — Z9889 Other specified postprocedural states: Secondary | ICD-10-CM | POA: Diagnosis not present

## 2011-09-18 DIAGNOSIS — M533 Sacrococcygeal disorders, not elsewhere classified: Secondary | ICD-10-CM | POA: Diagnosis not present

## 2011-09-18 DIAGNOSIS — R6889 Other general symptoms and signs: Secondary | ICD-10-CM | POA: Diagnosis not present

## 2011-09-18 DIAGNOSIS — K219 Gastro-esophageal reflux disease without esophagitis: Secondary | ICD-10-CM | POA: Diagnosis not present

## 2011-09-18 DIAGNOSIS — J309 Allergic rhinitis, unspecified: Secondary | ICD-10-CM | POA: Diagnosis not present

## 2011-09-18 DIAGNOSIS — Z79899 Other long term (current) drug therapy: Secondary | ICD-10-CM | POA: Diagnosis not present

## 2011-09-18 NOTE — Telephone Encounter (Signed)
Pt state on lasik last two times it was filled it said take 1 tab twice a day and it shoud be 1 tab every other day pt would like this changed and put into her file.

## 2011-09-30 ENCOUNTER — Other Ambulatory Visit: Payer: Self-pay | Admitting: Physician Assistant

## 2011-09-30 NOTE — Telephone Encounter (Signed)
rx's authorized.

## 2011-10-11 DIAGNOSIS — K3184 Gastroparesis: Secondary | ICD-10-CM | POA: Diagnosis not present

## 2011-10-11 DIAGNOSIS — R1011 Right upper quadrant pain: Secondary | ICD-10-CM | POA: Diagnosis not present

## 2011-10-24 DIAGNOSIS — F4323 Adjustment disorder with mixed anxiety and depressed mood: Secondary | ICD-10-CM | POA: Diagnosis not present

## 2011-10-29 ENCOUNTER — Other Ambulatory Visit: Payer: Self-pay | Admitting: Physician Assistant

## 2011-10-29 NOTE — Telephone Encounter (Signed)
Pt needs labs etc.

## 2011-10-31 DIAGNOSIS — F4323 Adjustment disorder with mixed anxiety and depressed mood: Secondary | ICD-10-CM | POA: Diagnosis not present

## 2011-11-07 ENCOUNTER — Other Ambulatory Visit (HOSPITAL_COMMUNITY): Payer: Self-pay | Admitting: Gastroenterology

## 2011-11-07 DIAGNOSIS — K3184 Gastroparesis: Secondary | ICD-10-CM

## 2011-11-07 DIAGNOSIS — F4323 Adjustment disorder with mixed anxiety and depressed mood: Secondary | ICD-10-CM | POA: Diagnosis not present

## 2011-11-07 DIAGNOSIS — R1011 Right upper quadrant pain: Secondary | ICD-10-CM | POA: Diagnosis not present

## 2011-11-09 ENCOUNTER — Encounter (HOSPITAL_COMMUNITY)
Admission: RE | Admit: 2011-11-09 | Discharge: 2011-11-09 | Disposition: A | Discharge status: Home / Self Care | Payer: Medicare Other | Source: Ambulatory Visit | Attending: Gastroenterology | Admitting: Gastroenterology

## 2011-11-09 DIAGNOSIS — K3184 Gastroparesis: Secondary | ICD-10-CM

## 2011-11-09 DIAGNOSIS — R109 Unspecified abdominal pain: Secondary | ICD-10-CM | POA: Diagnosis not present

## 2011-11-09 MED ORDER — TECHNETIUM TC 99M SULFUR COLLOID
2.0000 | Freq: Once | INTRAVENOUS | Status: AC | PRN
  Administered 2011-11-09: 2 via ORAL

## 2011-11-14 DIAGNOSIS — F4323 Adjustment disorder with mixed anxiety and depressed mood: Secondary | ICD-10-CM | POA: Diagnosis not present

## 2011-11-15 ENCOUNTER — Other Ambulatory Visit: Payer: Self-pay | Admitting: Gastroenterology

## 2011-11-15 DIAGNOSIS — R109 Unspecified abdominal pain: Secondary | ICD-10-CM

## 2011-11-15 DIAGNOSIS — E559 Vitamin D deficiency, unspecified: Secondary | ICD-10-CM | POA: Diagnosis not present

## 2011-11-15 DIAGNOSIS — IMO0001 Reserved for inherently not codable concepts without codable children: Secondary | ICD-10-CM | POA: Diagnosis not present

## 2011-11-15 DIAGNOSIS — M533 Sacrococcygeal disorders, not elsewhere classified: Secondary | ICD-10-CM | POA: Diagnosis not present

## 2011-11-15 DIAGNOSIS — M76899 Other specified enthesopathies of unspecified lower limb, excluding foot: Secondary | ICD-10-CM | POA: Diagnosis not present

## 2011-11-17 ENCOUNTER — Ambulatory Visit (INDEPENDENT_AMBULATORY_CARE_PROVIDER_SITE_OTHER): Payer: Medicare Other | Admitting: Family Medicine

## 2011-11-17 VITALS — BP 134/90 | HR 100 | Temp 98.7°F | Resp 18 | Ht 66.5 in | Wt 304.0 lb

## 2011-11-17 DIAGNOSIS — R1011 Right upper quadrant pain: Secondary | ICD-10-CM | POA: Diagnosis not present

## 2011-11-17 DIAGNOSIS — R52 Pain, unspecified: Secondary | ICD-10-CM

## 2011-11-17 LAB — POCT CBC
Granulocyte percent: 69.9 %G (ref 37–80)
HCT, POC: 43 % (ref 37.7–47.9)
Hemoglobin: 14.1 g/dL (ref 12.2–16.2)
Lymph, poc: 2.4 (ref 0.6–3.4)
MCH, POC: 29.1 pg (ref 27–31.2)
MCHC: 32.8 g/dL (ref 31.8–35.4)
MCV: 88.6 fL (ref 80–97)
MID (cbc): 0.4 (ref 0–0.9)
MPV: 9.5 fL (ref 0–99.8)
POC Granulocyte: 6.5 (ref 2–6.9)
POC LYMPH PERCENT: 25.4 %L (ref 10–50)
POC MID %: 4.7 %M (ref 0–12)
Platelet Count, POC: 380 10*3/uL (ref 142–424)
RBC: 4.85 M/uL (ref 4.04–5.48)
RDW, POC: 14.8 %
WBC: 9.3 10*3/uL (ref 4.6–10.2)

## 2011-11-17 LAB — POCT URINALYSIS DIPSTICK
Bilirubin, UA: NEGATIVE
Blood, UA: NEGATIVE
Glucose, UA: NEGATIVE
Ketones, UA: NEGATIVE
Leukocytes, UA: NEGATIVE
Nitrite, UA: NEGATIVE
Protein, UA: NEGATIVE
Spec Grav, UA: 1.01
Urobilinogen, UA: 0.2
pH, UA: 6

## 2011-11-17 NOTE — Patient Instructions (Signed)
Start probiotics (Culturelle) twice daily.  Follow up in a week if symptoms persist.

## 2011-11-17 NOTE — Progress Notes (Signed)
This is a 51 year old woman who complains of several weeks (17 days) of right-sided abdominal pain. She saw Dr. Madilyn Fireman not long ago prescribed Creon for her. He also did a gastric emptying test which turned out to be normal, but patient was on Zithromax at the time which may be influenced the motility study.  She's had no fever, blood in stools, or vomiting. No she's had for 4-6 mucus-type stools per day, and has already had 3 loose stools this morning.   Objective: Obese middle-age woman in no acute distress HEENT: Unremarkable Skin: Mildly diaphoretic Chest: Clear Heart: Regular no murmur Abdomen: Protuberant, active bowel sounds, no HSM, tender with deep palpation right side of abdomen including right upper quadrant and right midabdomen. Results for orders placed in visit on 11/17/11  POCT CBC      Component Value Range   WBC 9.3  4.6 - 10.2 (K/uL)   Lymph, poc 2.4  0.6 - 3.4    POC LYMPH PERCENT 25.4  10 - 50 (%L)   MID (cbc) 0.4  0 - 0.9    POC MID % 4.7  0 - 12 (%M)   POC Granulocyte 6.5  2 - 6.9    Granulocyte percent 69.9  37 - 80 (%G)   RBC 4.85  4.04 - 5.48 (M/uL)   Hemoglobin 14.1  12.2 - 16.2 (g/dL)   HCT, POC 54.0  98.1 - 47.9 (%)   MCV 88.6  80 - 97 (fL)   MCH, POC 29.1  27 - 31.2 (pg)   MCHC 32.8  31.8 - 35.4 (g/dL)   RDW, POC 19.1     Platelet Count, POC 380  142 - 424 (K/uL)   MPV 9.5  0 - 99.8 (fL)  POCT URINALYSIS DIPSTICK      Component Value Range   Color, UA pale     Clarity, UA clear     Glucose, UA neg     Bilirubin, UA neg     Ketones, UA neg     Spec Grav, UA 1.010     Blood, UA neg     pH, UA 6.0     Protein, UA neg     Urobilinogen, UA 0.2     Nitrite, UA neg     Leukocytes, UA Negative       Assessment: This 51 year old old woman has nonspecific symptoms most consistent with irritable bowel. I don't think there is anything acute going on here and I'm going to recommend probiotics for now.  Plan: Probiotics followup one week unless symptoms  resolve

## 2011-11-21 ENCOUNTER — Ambulatory Visit
Admission: RE | Admit: 2011-11-21 | Discharge: 2011-11-21 | Disposition: A | Discharge status: Home / Self Care | Payer: Medicare Other | Source: Ambulatory Visit | Attending: Gastroenterology | Admitting: Gastroenterology

## 2011-11-21 ENCOUNTER — Telehealth: Payer: Self-pay

## 2011-11-21 DIAGNOSIS — R1011 Right upper quadrant pain: Secondary | ICD-10-CM | POA: Diagnosis not present

## 2011-11-21 DIAGNOSIS — F4323 Adjustment disorder with mixed anxiety and depressed mood: Secondary | ICD-10-CM | POA: Diagnosis not present

## 2011-11-21 DIAGNOSIS — R109 Unspecified abdominal pain: Secondary | ICD-10-CM

## 2011-11-21 DIAGNOSIS — K219 Gastro-esophageal reflux disease without esophagitis: Secondary | ICD-10-CM | POA: Diagnosis not present

## 2011-11-21 MED ORDER — IOHEXOL 300 MG/ML  SOLN
125.0000 mL | Freq: Once | INTRAMUSCULAR | Status: AC | PRN
  Administered 2011-11-21: 125 mL via INTRAVENOUS

## 2011-11-21 NOTE — Telephone Encounter (Signed)
PATIENT SAW DR. L RECENTLY AND SAID THAT ON HER PRINT OUT SHE RECEIVED WHEN SHE LEFT,  THERE WERE SEVERAL MISTAKES LISTED INVOLVING HER MEDICATIONS.  SHE WOULD LIKE TO GET THESE FIXED SO THAT SHE WILL NOT HAVE A PROBLEM WITH HER REFILLS IN THE FUTURE

## 2011-11-22 NOTE — Telephone Encounter (Signed)
Pt advised [place] of some meds that she was not taking and that did not have the right directions.  Asked pt which ones she had questions about.  She also stated that one of her meds was refilled and it was not right and she got the wrong dose and pt was in pain because the meds was wrong.  All meds should hopefully be fixed and updated.  Advised pt that I will speak with management to express concerns about entering meds.

## 2011-11-22 NOTE — Addendum Note (Signed)
Addended by: Thelma Barge D on: 11/22/2011 10:49 AM   Modules accepted: Orders

## 2011-11-28 DIAGNOSIS — F4323 Adjustment disorder with mixed anxiety and depressed mood: Secondary | ICD-10-CM | POA: Diagnosis not present

## 2011-12-06 DIAGNOSIS — R109 Unspecified abdominal pain: Secondary | ICD-10-CM | POA: Diagnosis not present

## 2011-12-07 DIAGNOSIS — F4323 Adjustment disorder with mixed anxiety and depressed mood: Secondary | ICD-10-CM | POA: Diagnosis not present

## 2011-12-07 DIAGNOSIS — K3184 Gastroparesis: Secondary | ICD-10-CM | POA: Diagnosis not present

## 2011-12-07 DIAGNOSIS — R1011 Right upper quadrant pain: Secondary | ICD-10-CM | POA: Diagnosis not present

## 2011-12-11 DIAGNOSIS — M545 Low back pain, unspecified: Secondary | ICD-10-CM | POA: Diagnosis not present

## 2011-12-11 DIAGNOSIS — M533 Sacrococcygeal disorders, not elsewhere classified: Secondary | ICD-10-CM | POA: Diagnosis not present

## 2011-12-11 DIAGNOSIS — M79609 Pain in unspecified limb: Secondary | ICD-10-CM | POA: Diagnosis not present

## 2011-12-11 DIAGNOSIS — Z79899 Other long term (current) drug therapy: Secondary | ICD-10-CM | POA: Diagnosis not present

## 2011-12-19 DIAGNOSIS — F4323 Adjustment disorder with mixed anxiety and depressed mood: Secondary | ICD-10-CM | POA: Diagnosis not present

## 2011-12-21 ENCOUNTER — Ambulatory Visit (HOSPITAL_COMMUNITY)
Admission: RE | Admit: 2011-12-21 | Discharge: 2011-12-21 | Disposition: A | Discharge status: Home / Self Care | Payer: Medicare Other | Source: Ambulatory Visit | Attending: Gastroenterology | Admitting: Gastroenterology

## 2011-12-21 ENCOUNTER — Encounter (HOSPITAL_COMMUNITY)
Admission: RE | Disposition: A | Discharge status: Home / Self Care | Payer: Self-pay | Source: Ambulatory Visit | Attending: Gastroenterology

## 2011-12-21 ENCOUNTER — Encounter (HOSPITAL_COMMUNITY): Payer: Self-pay

## 2011-12-21 DIAGNOSIS — I10 Essential (primary) hypertension: Secondary | ICD-10-CM | POA: Diagnosis not present

## 2011-12-21 DIAGNOSIS — IMO0001 Reserved for inherently not codable concepts without codable children: Secondary | ICD-10-CM | POA: Diagnosis not present

## 2011-12-21 DIAGNOSIS — F329 Major depressive disorder, single episode, unspecified: Secondary | ICD-10-CM | POA: Insufficient documentation

## 2011-12-21 DIAGNOSIS — K219 Gastro-esophageal reflux disease without esophagitis: Secondary | ICD-10-CM | POA: Insufficient documentation

## 2011-12-21 DIAGNOSIS — F3289 Other specified depressive episodes: Secondary | ICD-10-CM | POA: Insufficient documentation

## 2011-12-21 DIAGNOSIS — K3189 Other diseases of stomach and duodenum: Secondary | ICD-10-CM | POA: Diagnosis not present

## 2011-12-21 DIAGNOSIS — G4730 Sleep apnea, unspecified: Secondary | ICD-10-CM | POA: Insufficient documentation

## 2011-12-21 DIAGNOSIS — F411 Generalized anxiety disorder: Secondary | ICD-10-CM | POA: Insufficient documentation

## 2011-12-21 DIAGNOSIS — K3184 Gastroparesis: Secondary | ICD-10-CM | POA: Insufficient documentation

## 2011-12-21 DIAGNOSIS — K589 Irritable bowel syndrome without diarrhea: Secondary | ICD-10-CM

## 2011-12-21 DIAGNOSIS — R1013 Epigastric pain: Secondary | ICD-10-CM | POA: Diagnosis not present

## 2011-12-21 DIAGNOSIS — M129 Arthropathy, unspecified: Secondary | ICD-10-CM | POA: Diagnosis not present

## 2011-12-21 HISTORY — DX: Mental disorder, not otherwise specified: F99

## 2011-12-21 HISTORY — DX: Unspecified osteoarthritis, unspecified site: M19.90

## 2011-12-21 HISTORY — DX: Headache: R51

## 2011-12-21 HISTORY — DX: Essential (primary) hypertension: I10

## 2011-12-21 HISTORY — PX: ESOPHAGOGASTRODUODENOSCOPY: SHX5428

## 2011-12-21 HISTORY — DX: Sleep apnea, unspecified: G47.30

## 2011-12-21 HISTORY — DX: Anxiety disorder, unspecified: F41.9

## 2011-12-21 HISTORY — DX: Neuralgia and neuritis, unspecified: M79.2

## 2011-12-21 HISTORY — DX: Fibromyalgia: M79.7

## 2011-12-21 HISTORY — DX: Gastro-esophageal reflux disease without esophagitis: K21.9

## 2011-12-21 SURGERY — EGD (ESOPHAGOGASTRODUODENOSCOPY)
Anesthesia: Moderate Sedation

## 2011-12-21 MED ORDER — FENTANYL CITRATE 0.05 MG/ML IJ SOLN
INTRAMUSCULAR | Status: DC | PRN
  Administered 2011-12-21 (×3): 25 ug via INTRAVENOUS

## 2011-12-21 MED ORDER — MIDAZOLAM HCL 10 MG/2ML IJ SOLN
INTRAMUSCULAR | Status: AC
  Filled 2011-12-21: qty 4

## 2011-12-21 MED ORDER — FENTANYL CITRATE 0.05 MG/ML IJ SOLN
INTRAMUSCULAR | Status: AC
  Filled 2011-12-21: qty 4

## 2011-12-21 MED ORDER — DIPHENHYDRAMINE HCL 50 MG/ML IJ SOLN
INTRAMUSCULAR | Status: DC | PRN
  Administered 2011-12-21 (×2): 12.5 mg via INTRAVENOUS

## 2011-12-21 MED ORDER — SODIUM CHLORIDE 0.9 % IV SOLN: INTRAVENOUS | Status: DC

## 2011-12-21 MED ORDER — DIPHENHYDRAMINE HCL 50 MG/ML IJ SOLN
INTRAMUSCULAR | Status: AC
  Filled 2011-12-21: qty 1

## 2011-12-21 MED ORDER — MIDAZOLAM HCL 10 MG/2ML IJ SOLN
INTRAMUSCULAR | Status: DC | PRN
  Administered 2011-12-21 (×4): 2 mg via INTRAVENOUS

## 2011-12-21 MED ORDER — SODIUM CHLORIDE 0.9 % IV SOLN
INTRAVENOUS | Status: DC
  Administered 2011-12-21: 10:00:00 via INTRAVENOUS

## 2011-12-21 MED ORDER — BUTAMBEN-TETRACAINE-BENZOCAINE 2-2-14 % EX AERO
INHALATION_SPRAY | CUTANEOUS | Status: DC | PRN
  Administered 2011-12-21: 2 via TOPICAL

## 2011-12-21 MED ORDER — ONABOTULINUMTOXINA 100 UNITS IJ SOLR
100.0000 [IU] | Freq: Once | INTRAMUSCULAR | Status: AC
  Administered 2011-12-21: 100 [IU] via INTRAMUSCULAR
  Filled 2011-12-21: qty 100

## 2011-12-21 NOTE — H&P (Signed)
[facility] Admission History & Physical  Chief Complaint: Nausea HPI: Valerie [name] is an 51 y.o. white female.  With idiopathic gastroparesis refractory to pharmacologic management. She is here for EGD to rule out anatomical causes of delayed gastric emptying and if none found attempt Botox injection of the pyloric sphincter  Past Medical History  Diagnosis Date  . Hypertension   . Anxiety   . Mental disorder   . Depression   . Sleep apnea   . GERD (gastroesophageal reflux disease)   . Headache   . Neuromuscular disorder   . 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 Fibromyalgia   . Arthritis     Past Surgical History  Procedure Date  . Cholecystectomy   . Rotator cuff repair bilateral  . Nasal septoplasty w/ turbinoplasty     Medications Prior to Admission  Medication Sig Dispense Refill  . aliskiren (TEKTURNA) 300 MG tablet Take 1 tablet (300 mg total) by mouth daily.  30 tablet  5  . allopurinol (ZYLOPRIM) 300 MG tablet 1/2 tab every day      . ALPRAZolam (XANAX) 1 MG tablet Take 1 tablet (1 mg total) by mouth as needed.  30 tablet  5  . BLACK COHOSH PO Take 1 tablet by mouth as needed.      . cetirizine (ZYRTEC) 10 MG tablet Take 10 mg by mouth daily.      . cyclobenzaprine (FLEXERIL) 10 MG tablet Take 10 mg by mouth as needed.      . furosemide (LASIX) 20 MG tablet Take 20 mg by mouth every other day.      Marland Kitchen HYDROcodone-acetaminophen (LORTAB) 10-500 MG per tablet Take 1 tablet by mouth as needed.      Marland Kitchen ibuprofen (ADVIL,MOTRIN) 600 MG tablet Take 600 mg by mouth as needed.      . lamoTRIgine (LAMICTAL) 100 MG tablet Take 0.5 tablets (50 mg total) by mouth daily.  30 tablet  11  . lipase/protease/amylase (CREON-10/PANCREASE) 12000 UNITS CPEP Take 3 capsules by mouth 3 (three) times daily before meals.      . metoprolol succinate (TOPROL-XL) 100 MG 24 hr tablet Take 1 tablet (100 mg total) by mouth daily. Take with or immediately following a meal.  30  tablet  11  . morphine (KADIAN) 30 MG 24 hr capsule Take 30 mg by mouth 3 (three) times daily.      . Multiple Vitamin (MULTIVITAMIN) tablet Take 1 tablet by mouth daily.      Marland Kitchen omeprazole (PRILOSEC) 40 MG capsule Take 40 mg by mouth 2 (two) times daily.      Marland Kitchen spironolactone (ALDACTONE) 100 MG tablet Take 1 tablet (100 mg total) by mouth daily.  30 tablet  11  . albuterol (PROVENTIL HFA;VENTOLIN HFA) 108 (90 BASE) MCG/ACT inhaler Inhale 2 puffs into the lungs as needed.      . torsemide (DEMADEX) 20 MG tablet Take 10 mg by mouth daily as needed.        Allergies:  Allergies  Allergen Reactions  . Dilaudid (Hydromorphone Hcl) Other (See Comments)    Headache   . Pregabalin   . Metoclopramide Hcl Other (See Comments)    "crawling inside"  . Neurontin (Gabapentin) Other (See Comments)    Tremors   . Other Other (See Comments)    Avoid all SSRI's and SNRI's- damaged pancreas    History reviewed. No pertinent family history.  Social History:  reports that she  has never smoked. She does not have any smokeless tobacco history on file. She reports that she does not drink alcohol or use illicit drugs.  Review of Systems: negative except as above   Blood pressure 123/66, temperature 98.3 F (36.8 C), temperature source Oral, resp. rate 20, height 5\' 5"  (1.651 m), weight 137.893 kg (304 lb), SpO2 96.00%. Head: Normocephalic, without obvious abnormality, atraumatic Neck: no adenopathy, no carotid bruit, no JVD, supple, symmetrical, trachea midline and thyroid not enlarged, symmetric, no tenderness/mass/nodules Resp: clear to auscultation bilaterally Cardio: regular rate and rhythm, S1, S2 normal, no murmur, click, rub or gallop GI: Abdomen soft nondistended Extremities: extremities normal, atraumatic, no cyanosis or edema  No results found for this or any previous visit (from the past 48 hour(s)). No results found.  Assessment:  idiopathic gastroparesis with with worsening  symptoms Plan: Proceed with EGD and possible Botox injection of the pylorus.  Letanya Froh C 12/21/2011, 10:26 AM

## 2011-12-21 NOTE — Op Note (Signed)
[facility] [address] [place], [place]  [address]  ENDOSCOPY PROCEDURE REPORT  PATIENT:  Valerie [name], Valerie [name]  MR#:  [id number] BIRTHDATE:  1960-12-08, 50 yrs. old  GENDER:  female  ENDOSCOPIST:  Dorena Cookey Referred by:  PROCEDURE DATE:  12/21/2011 PROCEDURE: ASA CLASS: INDICATIONS:  MEDICATIONS:  ML 75 mcg, Versed 6 mg. TOPICAL ANESTHETIC: Cetacaine spray  DESCRIPTION OF PROCEDURE:   After the risks benefits and alternatives of the procedure were thoroughly explained, informed consent was obtained.  The Pentax Gastroscope [id number] endoscope was introduced through the mouth and advanced to the , without limitations.  The instrument was slowly withdrawn as the mucosa was fully examined. <<PROCEDUREIMAGES>>  FINDINGS: Normal esophagus stomach and duodenum. Botox injected 25 mcg in each of 4 quadrants directly into the pyloric sphincter muscle.  COMPLICATIONS:  None immediate  ENDOSCOPIC IMPRESSION:  RECOMMENDATIONS: Followup in the office in 3 weeks  REPEAT EXAM:  ______________________________ Dorena Cookey  CC:  n. eSIGNED:   Dorena Cookey at 12/21/2011 10:51 AM  Naomie Dean, [id number]

## 2011-12-24 ENCOUNTER — Encounter (HOSPITAL_COMMUNITY): Payer: Self-pay | Admitting: Gastroenterology

## 2011-12-26 DIAGNOSIS — F4323 Adjustment disorder with mixed anxiety and depressed mood: Secondary | ICD-10-CM | POA: Diagnosis not present

## 2012-01-04 DIAGNOSIS — F4323 Adjustment disorder with mixed anxiety and depressed mood: Secondary | ICD-10-CM | POA: Diagnosis not present

## 2012-01-09 DIAGNOSIS — F4323 Adjustment disorder with mixed anxiety and depressed mood: Secondary | ICD-10-CM | POA: Diagnosis not present

## 2012-01-22 DIAGNOSIS — F4323 Adjustment disorder with mixed anxiety and depressed mood: Secondary | ICD-10-CM | POA: Diagnosis not present

## 2012-01-24 DIAGNOSIS — K3184 Gastroparesis: Secondary | ICD-10-CM | POA: Diagnosis not present

## 2012-01-25 ENCOUNTER — Ambulatory Visit: Payer: Medicare Other

## 2012-01-25 ENCOUNTER — Ambulatory Visit (INDEPENDENT_AMBULATORY_CARE_PROVIDER_SITE_OTHER): Payer: Medicare Other | Admitting: Emergency Medicine

## 2012-01-25 VITALS — BP 128/70 | HR 104 | Temp 98.7°F | Resp 18 | Ht 66.5 in | Wt 308.0 lb

## 2012-01-25 DIAGNOSIS — R05 Cough: Secondary | ICD-10-CM

## 2012-01-25 DIAGNOSIS — R059 Cough, unspecified: Secondary | ICD-10-CM | POA: Diagnosis not present

## 2012-01-25 DIAGNOSIS — J029 Acute pharyngitis, unspecified: Secondary | ICD-10-CM

## 2012-01-25 DIAGNOSIS — N390 Urinary tract infection, site not specified: Secondary | ICD-10-CM | POA: Diagnosis not present

## 2012-01-25 DIAGNOSIS — R300 Dysuria: Secondary | ICD-10-CM | POA: Diagnosis not present

## 2012-01-25 LAB — POCT UA - MICROSCOPIC ONLY: Crystals, Ur, HPF, POC: NEGATIVE

## 2012-01-25 LAB — POCT URINALYSIS DIPSTICK
Ketones, UA: NEGATIVE
Spec Grav, UA: 1.01
pH, UA: 5

## 2012-01-25 MED ORDER — CIPROFLOXACIN HCL 500 MG PO TABS: 500.0000 mg | ORAL_TABLET | Freq: Two times a day (BID) | ORAL | Status: AC

## 2012-01-25 NOTE — Progress Notes (Deleted)
  Subjective:    Patient ID: Valerie [name], female    DOB: 08/24/1960, 51 y.o.   MRN: [id number]  HPI    Review of Systems     Objective:   Physical Exam        Assessment & Plan:

## 2012-01-25 NOTE — Progress Notes (Signed)
  Subjective:    Patient ID: Valerie [name], female    DOB: [birth year], 51 y.o.   MRN: [id number]  HPI  51 yo female comes in with complaints of uti.  Sx are urethral spasms Tuesday night took some pyridium.  Woke up this am with back pain. Problems   with interstitial cystitis for 10 years.  Seen james hamby in [place].  She has bad reflux and hadnt had any problems since injection of botox in pyloric sphincter r.  Woke up Sunday night with aspiration.  Coughing up a little mucus that is slightly yellow.  Takes prilosec twice a day.  Mild sore throat for three weeks.        Review of Systems     Objective:   Physical Exam patient is alert and cooperative not in distress. Her pulse ox was good. Neck is supple her chest was clear to auscultation and percussion cardiac exam is unremarkable. The abdomen revealed no areas of tenderness and no masses were palpable .  [facility] reading (PRIMARY) by  Dr. Cleta Alberts NAD  Results for orders placed in visit on 01/25/12  POCT UA - MICROSCOPIC ONLY      Component Value Range   WBC, Ur, HPF, POC TNTC     RBC, urine, microscopic 0-3     Bacteria, U Microscopic 2+     Mucus, UA neg     Epithelial cells, urine per micros 3-5     Crystals, Ur, HPF, POC neg     Casts, Ur, LPF, POC neg     Yeast, UA neg    POCT URINALYSIS DIPSTICK      Component Value Range   Color, UA orange     Clarity, UA cloudy     Glucose, UA 100     Bilirubin, UA neg     Ketones, UA neg     Spec Grav, UA 1.010     Blood, UA trace     pH, UA 5.0     Protein, UA trace     Urobilinogen, UA 1.0     Nitrite, UA positive     Leukocytes, UA small (1+)    POCT RAPID STREP A (OFFICE)      Component Value Range   Rapid Strep A Screen Negative  Negative        Assessment & Plan:  Urine culture was done chest x-ray is unremarkable. We'll treat with Cipro 500 twice a day.

## 2012-01-27 LAB — URINE CULTURE

## 2012-01-27 MED ORDER — AMOXICILLIN 500 MG PO CAPS: 500.0000 mg | ORAL_CAPSULE | Freq: Two times a day (BID) | ORAL | Status: DC

## 2012-01-27 NOTE — Addendum Note (Signed)
Addended by: Anders Simmonds on: 01/27/2012 03:10 PM   Modules accepted: Orders

## 2012-01-29 DIAGNOSIS — F4323 Adjustment disorder with mixed anxiety and depressed mood: Secondary | ICD-10-CM | POA: Diagnosis not present

## 2012-01-30 ENCOUNTER — Telehealth: Payer: Self-pay

## 2012-01-30 NOTE — Telephone Encounter (Signed)
Pt.notified

## 2012-01-30 NOTE — Telephone Encounter (Signed)
Pt was seen last week for UTI, she is taking Cipro and Antibiotics - worse now than before.  Please call [phone]

## 2012-01-30 NOTE — Telephone Encounter (Signed)
Please call her and have her drink large quantities of water. She can take the amoxicillin 3 times a day. Her culture grew enterococcus which is sensitive to the amoxicillin. If she is still having problems tomorrow she should return to clinic for repeat urine. I began work of 11 and I would be happy to see her.

## 2012-01-30 NOTE — Telephone Encounter (Signed)
Dr. Cleta Alberts, pt states she is not feeling any better at all. Today is day 4 of the amox. Pt wanted me to ask you if you had any suggestions or should she just come back in

## 2012-02-02 ENCOUNTER — Telehealth: Payer: Self-pay

## 2012-02-02 MED ORDER — FLUCONAZOLE 150 MG PO TABS: 150.0000 mg | ORAL_TABLET | Freq: Once | ORAL | Status: AC

## 2012-02-02 NOTE — Telephone Encounter (Signed)
She was thankful when advised it is ready for pick up

## 2012-02-02 NOTE — Telephone Encounter (Signed)
Patient given Amox on 8/15-- can we rx Diflucan for her?

## 2012-02-02 NOTE — Telephone Encounter (Signed)
PT WAS TOLD IF THE MEDICINE SHE WAS ON GAVE HER A YEAST INFECTION, WE WOULD CALL IN A DIFLUCAN FOR HER PLEASE CALL [phone]    [name] TEETER [place]

## 2012-02-02 NOTE — Telephone Encounter (Signed)
Order for Diflucan signed.

## 2012-02-05 ENCOUNTER — Ambulatory Visit (INDEPENDENT_AMBULATORY_CARE_PROVIDER_SITE_OTHER): Payer: Medicare Other | Admitting: Family Medicine

## 2012-02-05 VITALS — BP 139/80 | HR 92 | Temp 98.6°F | Resp 16

## 2012-02-05 DIAGNOSIS — M545 Low back pain, unspecified: Secondary | ICD-10-CM | POA: Diagnosis not present

## 2012-02-05 DIAGNOSIS — R3989 Other symptoms and signs involving the genitourinary system: Secondary | ICD-10-CM

## 2012-02-05 DIAGNOSIS — R300 Dysuria: Secondary | ICD-10-CM

## 2012-02-05 DIAGNOSIS — F4323 Adjustment disorder with mixed anxiety and depressed mood: Secondary | ICD-10-CM | POA: Diagnosis not present

## 2012-02-05 LAB — POCT URINALYSIS DIPSTICK
Bilirubin, UA: NEGATIVE
Glucose, UA: NEGATIVE
Leukocytes, UA: NEGATIVE
Nitrite, UA: NEGATIVE

## 2012-02-05 LAB — POCT UA - MICROSCOPIC ONLY
Bacteria, U Microscopic: NEGATIVE
RBC, urine, microscopic: NEGATIVE

## 2012-02-05 MED ORDER — FLUCONAZOLE 150 MG PO TABS: 150.0000 mg | ORAL_TABLET | Freq: Once | ORAL | Status: AC

## 2012-02-05 MED ORDER — NITROFURANTOIN MONOHYD MACRO 100 MG PO CAPS: 100.0000 mg | ORAL_CAPSULE | Freq: Two times a day (BID) | ORAL | Status: AC

## 2012-02-05 NOTE — Progress Notes (Signed)
51 yo woman with acute dysuria.  She was found to have enterococcus and antibiotic was changed from Cipro to Amoxicillin, the symptoms are worsening.  The last amoxicillin was taken Saturday.  The diflucan was taken Saturday.  She is under great stress as school is not letting her back in grad school.  Objective:  tearful   Culture ENTEROCOCCUS SPECIES Colony Count >=100,000 COLONIES/ML Organism ID, Bacteria ENTEROCOCCUS SPECIES Resulting Agency [facility]   Results for orders placed in visit on 02/05/12  POCT URINALYSIS DIPSTICK      Component Value Range   Color, UA yellow     Clarity, UA clear     Glucose, UA neg     Bilirubin, UA neg     Ketones, UA neg     Spec Grav, UA 1.020     Blood, UA neg     pH, UA 6.5     Protein, UA neg     Urobilinogen, UA 0.2     Nitrite, UA neg     Leukocytes, UA Negative    POCT UA - MICROSCOPIC ONLY      Component Value Range   WBC, Ur, HPF, POC neg     RBC, urine, microscopic neg     Bacteria, U Microscopic neg     Mucus, UA neg     Epithelial cells, urine per micros neg     Crystals, Ur, HPF, POC neg     Casts, Ur, LPF, POC neg     Yeast, UA neg     Assessment:  Acute dysuria syndrome c/w interstitial cystitis  Plan:  macrobid and diflucan

## 2012-02-13 DIAGNOSIS — F4323 Adjustment disorder with mixed anxiety and depressed mood: Secondary | ICD-10-CM | POA: Diagnosis not present

## 2012-02-17 ENCOUNTER — Other Ambulatory Visit: Payer: Self-pay | Admitting: Physician Assistant

## 2012-02-20 DIAGNOSIS — F4323 Adjustment disorder with mixed anxiety and depressed mood: Secondary | ICD-10-CM | POA: Diagnosis not present

## 2012-02-27 DIAGNOSIS — F4323 Adjustment disorder with mixed anxiety and depressed mood: Secondary | ICD-10-CM | POA: Diagnosis not present

## 2012-03-05 DIAGNOSIS — F4323 Adjustment disorder with mixed anxiety and depressed mood: Secondary | ICD-10-CM | POA: Diagnosis not present

## 2012-03-11 DIAGNOSIS — M2550 Pain in unspecified joint: Secondary | ICD-10-CM | POA: Diagnosis not present

## 2012-03-11 DIAGNOSIS — F4323 Adjustment disorder with mixed anxiety and depressed mood: Secondary | ICD-10-CM | POA: Diagnosis not present

## 2012-03-12 DIAGNOSIS — F329 Major depressive disorder, single episode, unspecified: Secondary | ICD-10-CM | POA: Diagnosis not present

## 2012-03-12 DIAGNOSIS — F3289 Other specified depressive episodes: Secondary | ICD-10-CM | POA: Diagnosis not present

## 2012-03-12 DIAGNOSIS — F4310 Post-traumatic stress disorder, unspecified: Secondary | ICD-10-CM | POA: Diagnosis not present

## 2012-03-12 DIAGNOSIS — K859 Acute pancreatitis without necrosis or infection, unspecified: Secondary | ICD-10-CM | POA: Diagnosis not present

## 2012-03-12 DIAGNOSIS — E669 Obesity, unspecified: Secondary | ICD-10-CM | POA: Diagnosis not present

## 2012-03-12 DIAGNOSIS — Z79899 Other long term (current) drug therapy: Secondary | ICD-10-CM | POA: Diagnosis not present

## 2012-03-12 DIAGNOSIS — M79609 Pain in unspecified limb: Secondary | ICD-10-CM | POA: Diagnosis not present

## 2012-03-12 DIAGNOSIS — M66329 Spontaneous rupture of flexor tendons, unspecified upper arm: Secondary | ICD-10-CM | POA: Diagnosis not present

## 2012-03-12 DIAGNOSIS — Z9889 Other specified postprocedural states: Secondary | ICD-10-CM | POA: Diagnosis not present

## 2012-03-12 DIAGNOSIS — M545 Low back pain, unspecified: Secondary | ICD-10-CM | POA: Diagnosis not present

## 2012-03-18 DIAGNOSIS — F4323 Adjustment disorder with mixed anxiety and depressed mood: Secondary | ICD-10-CM | POA: Diagnosis not present

## 2012-03-21 DIAGNOSIS — F4323 Adjustment disorder with mixed anxiety and depressed mood: Secondary | ICD-10-CM | POA: Diagnosis not present

## 2012-03-25 DIAGNOSIS — F4323 Adjustment disorder with mixed anxiety and depressed mood: Secondary | ICD-10-CM | POA: Diagnosis not present

## 2012-04-01 DIAGNOSIS — F4323 Adjustment disorder with mixed anxiety and depressed mood: Secondary | ICD-10-CM | POA: Diagnosis not present

## 2012-04-03 DIAGNOSIS — Z23 Encounter for immunization: Secondary | ICD-10-CM | POA: Diagnosis not present

## 2012-04-09 DIAGNOSIS — F4323 Adjustment disorder with mixed anxiety and depressed mood: Secondary | ICD-10-CM | POA: Diagnosis not present

## 2012-04-11 ENCOUNTER — Other Ambulatory Visit: Payer: Self-pay | Admitting: *Deleted

## 2012-04-11 DIAGNOSIS — M545 Low back pain, unspecified: Secondary | ICD-10-CM

## 2012-04-11 DIAGNOSIS — M79604 Pain in right leg: Secondary | ICD-10-CM

## 2012-04-11 DIAGNOSIS — M79605 Pain in left leg: Secondary | ICD-10-CM

## 2012-04-18 ENCOUNTER — Ambulatory Visit
Admission: RE | Admit: 2012-04-18 | Discharge: 2012-04-18 | Disposition: A | Discharge status: Home / Self Care | Payer: Medicare Other | Source: Ambulatory Visit | Attending: *Deleted | Admitting: *Deleted

## 2012-04-18 DIAGNOSIS — M545 Low back pain, unspecified: Secondary | ICD-10-CM

## 2012-04-18 DIAGNOSIS — M79604 Pain in right leg: Secondary | ICD-10-CM

## 2012-04-18 DIAGNOSIS — M5126 Other intervertebral disc displacement, lumbar region: Secondary | ICD-10-CM | POA: Diagnosis not present

## 2012-04-18 DIAGNOSIS — M79605 Pain in left leg: Secondary | ICD-10-CM

## 2012-04-18 DIAGNOSIS — F4323 Adjustment disorder with mixed anxiety and depressed mood: Secondary | ICD-10-CM | POA: Diagnosis not present

## 2012-04-28 ENCOUNTER — Emergency Department (HOSPITAL_COMMUNITY): Payer: Medicare Other

## 2012-04-28 ENCOUNTER — Encounter (HOSPITAL_COMMUNITY): Payer: Self-pay | Admitting: Emergency Medicine

## 2012-04-28 ENCOUNTER — Ambulatory Visit: Payer: Medicare Other

## 2012-04-28 ENCOUNTER — Inpatient Hospital Stay (HOSPITAL_COMMUNITY)
Admission: EM | Admit: 2012-04-28 | Discharge: 2012-05-02 | DRG: 394 | Disposition: A | Discharge status: Home / Self Care | Payer: Medicare Other | Attending: Internal Medicine | Admitting: Internal Medicine

## 2012-04-28 ENCOUNTER — Ambulatory Visit (INDEPENDENT_AMBULATORY_CARE_PROVIDER_SITE_OTHER): Payer: Medicare Other | Admitting: Internal Medicine

## 2012-04-28 VITALS — BP 113/81 | HR 105 | Temp 98.9°F | Resp 18 | Ht 64.5 in | Wt 299.0 lb

## 2012-04-28 DIAGNOSIS — F4310 Post-traumatic stress disorder, unspecified: Secondary | ICD-10-CM | POA: Diagnosis not present

## 2012-04-28 DIAGNOSIS — K922 Gastrointestinal hemorrhage, unspecified: Secondary | ICD-10-CM | POA: Diagnosis not present

## 2012-04-28 DIAGNOSIS — R109 Unspecified abdominal pain: Secondary | ICD-10-CM

## 2012-04-28 DIAGNOSIS — D72829 Elevated white blood cell count, unspecified: Secondary | ICD-10-CM | POA: Diagnosis present

## 2012-04-28 DIAGNOSIS — D649 Anemia, unspecified: Secondary | ICD-10-CM | POA: Diagnosis present

## 2012-04-28 DIAGNOSIS — F3289 Other specified depressive episodes: Secondary | ICD-10-CM | POA: Diagnosis present

## 2012-04-28 DIAGNOSIS — E669 Obesity, unspecified: Secondary | ICD-10-CM | POA: Diagnosis present

## 2012-04-28 DIAGNOSIS — K529 Noninfective gastroenteritis and colitis, unspecified: Secondary | ICD-10-CM | POA: Diagnosis present

## 2012-04-28 DIAGNOSIS — F329 Major depressive disorder, single episode, unspecified: Secondary | ICD-10-CM | POA: Diagnosis present

## 2012-04-28 DIAGNOSIS — Z6841 Body Mass Index (BMI) 40.0 and over, adult: Secondary | ICD-10-CM

## 2012-04-28 DIAGNOSIS — K921 Melena: Secondary | ICD-10-CM

## 2012-04-28 DIAGNOSIS — K5289 Other specified noninfective gastroenteritis and colitis: Secondary | ICD-10-CM | POA: Diagnosis not present

## 2012-04-28 DIAGNOSIS — K635 Polyp of colon: Secondary | ICD-10-CM

## 2012-04-28 DIAGNOSIS — R110 Nausea: Secondary | ICD-10-CM

## 2012-04-28 DIAGNOSIS — K589 Irritable bowel syndrome without diarrhea: Secondary | ICD-10-CM | POA: Diagnosis present

## 2012-04-28 DIAGNOSIS — K559 Vascular disorder of intestine, unspecified: Principal | ICD-10-CM | POA: Diagnosis present

## 2012-04-28 DIAGNOSIS — D62 Acute posthemorrhagic anemia: Secondary | ICD-10-CM | POA: Diagnosis not present

## 2012-04-28 DIAGNOSIS — Z79899 Other long term (current) drug therapy: Secondary | ICD-10-CM

## 2012-04-28 DIAGNOSIS — F419 Anxiety disorder, unspecified: Secondary | ICD-10-CM

## 2012-04-28 DIAGNOSIS — R1011 Right upper quadrant pain: Secondary | ICD-10-CM | POA: Diagnosis not present

## 2012-04-28 DIAGNOSIS — K219 Gastro-esophageal reflux disease without esophagitis: Secondary | ICD-10-CM | POA: Diagnosis present

## 2012-04-28 DIAGNOSIS — I10 Essential (primary) hypertension: Secondary | ICD-10-CM | POA: Diagnosis present

## 2012-04-28 DIAGNOSIS — IMO0001 Reserved for inherently not codable concepts without codable children: Secondary | ICD-10-CM

## 2012-04-28 DIAGNOSIS — G4730 Sleep apnea, unspecified: Secondary | ICD-10-CM | POA: Diagnosis present

## 2012-04-28 DIAGNOSIS — K648 Other hemorrhoids: Secondary | ICD-10-CM | POA: Diagnosis present

## 2012-04-28 HISTORY — DX: Anemia, unspecified: D64.9

## 2012-04-28 LAB — CBC WITH DIFFERENTIAL/PLATELET
Basophils Relative: 0 % (ref 0–1)
HCT: 40.3 % (ref 36.0–46.0)
Hemoglobin: 13.5 g/dL (ref 12.0–15.0)
Lymphocytes Relative: 17 % (ref 12–46)
Lymphs Abs: 3.2 10*3/uL (ref 0.7–4.0)
MCHC: 33.5 g/dL (ref 30.0–36.0)
Monocytes Absolute: 1 10*3/uL (ref 0.1–1.0)
Monocytes Relative: 5 % (ref 3–12)
Neutro Abs: 15.1 10*3/uL — ABNORMAL HIGH (ref 1.7–7.7)
Neutrophils Relative %: 78 % — ABNORMAL HIGH (ref 43–77)
RBC: 4.66 MIL/uL (ref 3.87–5.11)

## 2012-04-28 LAB — IFOBT (OCCULT BLOOD): IFOBT: POSITIVE

## 2012-04-28 LAB — POCT CBC
HCT, POC: 48.1 % — AB (ref 37.7–47.9)
Lymph, poc: 3.8 — AB (ref 0.6–3.4)
MCH, POC: 28.2 pg (ref 27–31.2)
MCHC: 30.6 g/dL — AB (ref 31.8–35.4)
MPV: 9.3 fL (ref 0–99.8)
POC Granulocyte: 19.3 — AB (ref 2–6.9)
POC LYMPH PERCENT: 15.8 %L (ref 10–50)
POC MID %: 3.4 %M (ref 0–12)
RDW, POC: 14.6 %

## 2012-04-28 LAB — COMPREHENSIVE METABOLIC PANEL
Alkaline Phosphatase: 48 U/L (ref 39–117)
BUN: 17 mg/dL (ref 6–23)
CO2: 26 mEq/L (ref 19–32)
Chloride: 98 mEq/L (ref 96–112)
Creatinine, Ser: 1.32 mg/dL — ABNORMAL HIGH (ref 0.50–1.10)
GFR calc non Af Amer: 46 mL/min — ABNORMAL LOW (ref >90)
Glucose, Bld: 101 mg/dL — ABNORMAL HIGH (ref 70–99)
Potassium: 4.2 mEq/L (ref 3.5–5.1)
Total Bilirubin: 0.3 mg/dL (ref 0.3–1.2)

## 2012-04-28 LAB — URINALYSIS, MICROSCOPIC ONLY
Bilirubin Urine: NEGATIVE
Urobilinogen, UA: 0.2 mg/dL (ref 0.0–1.0)

## 2012-04-28 LAB — LACTIC ACID, PLASMA: Lactic Acid, Venous: 1.6 mmol/L (ref 0.5–2.2)

## 2012-04-28 LAB — LIPASE, BLOOD: Lipase: 56 U/L (ref 11–59)

## 2012-04-28 MED ORDER — LAMOTRIGINE 25 MG PO TABS
50.0000 mg | ORAL_TABLET | Freq: Every day | ORAL | Status: DC
  Administered 2012-04-29 – 2012-05-02 (×4): 50 mg via ORAL
  Filled 2012-04-28 (×4): qty 2

## 2012-04-28 MED ORDER — ALBUTEROL SULFATE HFA 108 (90 BASE) MCG/ACT IN AERS: 2.0000 | INHALATION_SPRAY | RESPIRATORY_TRACT | Status: DC | PRN

## 2012-04-28 MED ORDER — MORPHINE SULFATE 2 MG/ML IJ SOLN
2.0000 mg | INTRAMUSCULAR | Status: DC | PRN
  Administered 2012-04-29 (×2): 2 mg via INTRAVENOUS
  Administered 2012-04-29 (×2): 4 mg via INTRAVENOUS
  Administered 2012-04-30 – 2012-05-01 (×3): 2 mg via INTRAVENOUS
  Filled 2012-04-28: qty 1
  Filled 2012-04-28: qty 2
  Filled 2012-04-28: qty 1
  Filled 2012-04-28: qty 2
  Filled 2012-04-28 (×2): qty 1
  Filled 2012-04-28: qty 2

## 2012-04-28 MED ORDER — ONDANSETRON HCL 4 MG/2ML IJ SOLN: 4.0000 mg | Freq: Three times a day (TID) | INTRAMUSCULAR | Status: AC | PRN

## 2012-04-28 MED ORDER — PANCRELIPASE (LIP-PROT-AMYL) 12000-38000 UNITS PO CPEP
3.0000 | ORAL_CAPSULE | Freq: Three times a day (TID) | ORAL | Status: DC
  Administered 2012-04-29 – 2012-05-02 (×5): 3 via ORAL
  Filled 2012-04-28 (×14): qty 3

## 2012-04-28 MED ORDER — METRONIDAZOLE IN NACL 5-0.79 MG/ML-% IV SOLN
500.0000 mg | Freq: Three times a day (TID) | INTRAVENOUS | Status: DC
  Administered 2012-04-29 – 2012-05-01 (×7): 500 mg via INTRAVENOUS
  Filled 2012-04-28 (×9): qty 100

## 2012-04-28 MED ORDER — METOPROLOL SUCCINATE ER 100 MG PO TB24
100.0000 mg | ORAL_TABLET | Freq: Every day | ORAL | Status: DC
  Administered 2012-04-29 – 2012-05-02 (×4): 100 mg via ORAL
  Filled 2012-04-28 (×4): qty 1

## 2012-04-28 MED ORDER — SODIUM CHLORIDE 0.9 % IJ SOLN
3.0000 mL | Freq: Two times a day (BID) | INTRAMUSCULAR | Status: DC
  Administered 2012-04-28 – 2012-05-02 (×4): 3 mL via INTRAVENOUS

## 2012-04-28 MED ORDER — PANTOPRAZOLE SODIUM 40 MG PO TBEC
80.0000 mg | DELAYED_RELEASE_TABLET | Freq: Two times a day (BID) | ORAL | Status: DC
  Administered 2012-04-30 – 2012-05-02 (×5): 80 mg via ORAL
  Filled 2012-04-28 (×10): qty 2

## 2012-04-28 MED ORDER — ALISKIREN FUMARATE 150 MG PO TABS
300.0000 mg | ORAL_TABLET | Freq: Every day | ORAL | Status: DC
  Administered 2012-04-29 – 2012-05-02 (×4): 300 mg via ORAL
  Filled 2012-04-28 (×4): qty 2

## 2012-04-28 MED ORDER — METRONIDAZOLE IN NACL 5-0.79 MG/ML-% IV SOLN
500.0000 mg | Freq: Once | INTRAVENOUS | Status: AC
  Administered 2012-04-28: 500 mg via INTRAVENOUS
  Filled 2012-04-28: qty 100

## 2012-04-28 MED ORDER — CIPROFLOXACIN IN D5W 400 MG/200ML IV SOLN
400.0000 mg | Freq: Two times a day (BID) | INTRAVENOUS | Status: DC
  Administered 2012-04-29 – 2012-05-01 (×4): 400 mg via INTRAVENOUS
  Filled 2012-04-28 (×7): qty 200

## 2012-04-28 MED ORDER — CYCLOBENZAPRINE HCL 10 MG PO TABS
10.0000 mg | ORAL_TABLET | Freq: Every day | ORAL | Status: DC
  Administered 2012-04-29 – 2012-05-01 (×3): 10 mg via ORAL
  Filled 2012-04-28 (×4): qty 1

## 2012-04-28 MED ORDER — CIPROFLOXACIN IN D5W 400 MG/200ML IV SOLN
400.0000 mg | Freq: Once | INTRAVENOUS | Status: AC
  Administered 2012-04-28: 400 mg via INTRAVENOUS
  Filled 2012-04-28: qty 200

## 2012-04-28 MED ORDER — MORPHINE SULFATE 4 MG/ML IJ SOLN
4.0000 mg | Freq: Once | INTRAMUSCULAR | Status: AC
  Administered 2012-04-28: 4 mg via INTRAVENOUS
  Filled 2012-04-28: qty 1

## 2012-04-28 MED ORDER — MORPHINE SULFATE ER 30 MG PO TBCR
30.0000 mg | EXTENDED_RELEASE_TABLET | Freq: Three times a day (TID) | ORAL | Status: DC
  Administered 2012-04-29 – 2012-05-02 (×11): 30 mg via ORAL
  Filled 2012-04-28 (×12): qty 1

## 2012-04-28 MED ORDER — ONDANSETRON HCL 4 MG/2ML IJ SOLN
4.0000 mg | Freq: Once | INTRAMUSCULAR | Status: AC
  Filled 2012-04-28: qty 2

## 2012-04-28 MED ORDER — ONDANSETRON HCL 4 MG/2ML IJ SOLN
4.0000 mg | Freq: Once | INTRAMUSCULAR | Status: AC
  Administered 2012-04-28: 4 mg via INTRAVENOUS
  Filled 2012-04-28: qty 2

## 2012-04-28 MED ORDER — IOHEXOL 300 MG/ML  SOLN
100.0000 mL | Freq: Once | INTRAMUSCULAR | Status: AC | PRN
  Administered 2012-04-28: 100 mL via INTRAVENOUS

## 2012-04-28 MED ORDER — ONDANSETRON HCL 4 MG/2ML IJ SOLN
4.0000 mg | Freq: Once | INTRAMUSCULAR | Status: AC
  Administered 2012-04-28: 4 mg via INTRAVENOUS

## 2012-04-28 MED ORDER — ALLOPURINOL 150 MG HALF TABLET
150.0000 mg | ORAL_TABLET | Freq: Every day | ORAL | Status: DC
  Administered 2012-04-29 – 2012-05-02 (×4): 150 mg via ORAL
  Filled 2012-04-28 (×4): qty 1

## 2012-04-28 MED ORDER — SODIUM CHLORIDE 0.9 % IV SOLN
INTRAVENOUS | Status: AC
  Administered 2012-04-28: 1000 mL via INTRAVENOUS

## 2012-04-28 MED ORDER — SODIUM CHLORIDE 0.9 % IV BOLUS (SEPSIS)
1000.0000 mL | Freq: Once | INTRAVENOUS | Status: AC
  Administered 2012-04-28: 1000 mL via INTRAVENOUS

## 2012-04-28 MED ORDER — LORAZEPAM 0.5 MG PO TABS
0.5000 mg | ORAL_TABLET | Freq: Once | ORAL | Status: DC
Start: 2012-04-28 — End: 2012-04-28

## 2012-04-28 MED ORDER — ONDANSETRON 4 MG PO TBDP: 8.0000 mg | ORAL_TABLET | Freq: Once | ORAL | Status: DC

## 2012-04-28 NOTE — ED Notes (Addendum)
Pt reports 10/10 abdominal pain that began last night. Pt reports bloody stools that was primarily blood that also began yesterday. Pt was seen at Urgent Care and was referred to be evaluated in the ED for a abdominal pain, bloody stools, and a WBC of 23.9. Pt reports nausea, however received Zofran at urgent care. Pt has audible bowel sounds and reports tenderness upon palpation of her abdomen.

## 2012-04-28 NOTE — Progress Notes (Signed)
  Subjective:    Patient ID: Valerie [name], female    DOB: 14-Jan-1961, 51 y.o.   MRN: [id number]  HPI Yesterday at store had sudden onset of nausea followed by severe abdom cramping, Went to [facility] and sat on stool for 1.5 hrs and finally passed bm. Had diaphoresis and dizzyness assoc. Went home and kept passing small stools and eventually passed large bloody stool. No vomiting. Now has small amt blood in frequent loose stools, still nauseas, no svere pain just ache, no syncope. Her BP is always on the low side.  Review of Systems See list    Objective:   Physical Exam  Constitutional: She is oriented to person, place, and time. No distress.  HENT:  Right Ear: External ear normal.  Left Ear: External ear normal.  Nose: Nose normal.  Mouth/Throat: Oropharynx is clear and moist.  Eyes: EOM are normal. No scleral icterus.  Cardiovascular: Regular rhythm and normal heart sounds.  Tachycardia present.   Pulmonary/Chest: Effort normal and breath sounds normal.  Abdominal: Soft. She exhibits no distension and no mass. Bowel sounds are decreased. There is tenderness in the right lower quadrant, suprapubic area and left lower quadrant. There is guarding. There is no rigidity and no CVA tenderness.         Pain on palpation  Neurological: She is alert and oriented to person, place, and time. She exhibits normal muscle tone. Coordination normal.  Skin: Skin is warm and dry.  Psychiatric: She has a normal mood and affect.   Results for orders placed in visit on 04/28/12  POCT CBC      Component Value Range   WBC 23.9 (*) 4.6 - 10.2 K/uL   Lymph, poc 3.8 (*) 0.6 - 3.4   POC LYMPH PERCENT 15.8  10 - 50 %L   MID (cbc) 0.8  0 - 0.9   POC MID % 3.4  0 - 12 %M   POC Granulocyte 19.3 (*) 2 - 6.9   Granulocyte percent 80.8 (*) 37 - 80 %G   RBC 5.21  4.04 - 5.48 M/uL   Hemoglobin 14.7  12.2 - 16.2 g/dL   HCT, POC 40.9 (*) 81.1 - 47.9 %   MCV 92.4  80 - 97 fL   MCH, POC 28.2  27 - 31.2 pg   MCHC 30.6 (*) 31.8 - 35.4 g/dL   RDW, POC 91.4     Platelet Count, POC 438 (*) 142 - 424 K/uL   MPV 9.3  0 - 99.8 fL  IFOBT (OCCULT BLOOD)      Component Value Range   IFOBT Positive      [facility] reading (PRIMARY) by  Dr.Lanasia Porras. Bloody stools and [address] wbc and left greater right abdominal pain.No free air seen, no obstruction seen  Zofran 8mg  po with improvement. Agrees to hospital ER called       Assessment & Plan:  Needs admission to hospital/ to er Spoke with triage nurse to expedite.

## 2012-04-28 NOTE — ED Provider Notes (Signed)
History     CSN: [id number]  Arrival date & time 04/28/12  1716   First MD Initiated Contact with Patient 04/28/12 1752      Chief Complaint  Patient presents with  . Abdominal Pain  . Rectal Bleeding    (Consider location/radiation/quality/duration/timing/severity/associated sxs/prior treatment) HPI Comments: Patient presents from urgent care with diffuse crampy abdominal pain that started yesterday while she was in a store. She was found to have a leukocytosis at urgent care with bloody stools and sent to the ED. She reports abdominal cramping persisted throughout the night associated with grossly bloody stools. She endorses nausea but no vomiting. No chest pain or shortness of breath. No dysuria hematuria, vaginal bleeding or discharge. She reports no history of inflammatory bowel disease.  she had a near-syncopal episode w while on the toilet yesterday. She denies any sick contacts.  The history is provided by the patient.    Past Medical History  Diagnosis Date  . Hypertension   . Anxiety   . Mental disorder   . Depression   . Sleep apnea   . GERD (gastroesophageal reflux disease)   . Headache   . Neuromuscular disorder   . 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 Fibromyalgia   . Arthritis     Past Surgical History  Procedure Date  . Cholecystectomy   . Rotator cuff repair bilateral  . Nasal septoplasty w/ turbinoplasty   . Esophagogastroduodenoscopy 12/21/2011    Procedure: ESOPHAGOGASTRODUODENOSCOPY (EGD);  Surgeon: Barrie Folk, MD;  Location: [facility] ENDOSCOPY;  Service: Endoscopy;  Laterality: N/A;  . Gallbladder surgery     Family History  Problem Relation Age of Onset  . Heart disease Mother   . COPD Mother   . Arthritis Mother   . Cancer Mother   . Arthritis Paternal Grandmother   . Heart disease Paternal Grandmother   . Heart disease Paternal Grandfather     History  Substance Use Topics  . Smoking status: Never Smoker   . Smokeless tobacco:  Not on file  . Alcohol Use: No    OB History    Grav Para Term Preterm Abortions TAB SAB Ect Mult Living                  Review of Systems  Constitutional: Positive for activity change, appetite change and fatigue. Negative for fever.  HENT: Negative for congestion and rhinorrhea.   Respiratory: Negative for cough, chest tightness and shortness of breath.   Cardiovascular: Negative for chest pain.  Gastrointestinal: Positive for nausea, abdominal pain, diarrhea, blood in stool and hematochezia. Negative for vomiting.  Genitourinary: Negative for dysuria and vaginal discharge.  Musculoskeletal: Negative.  Negative for back pain.  Skin: Negative for rash.  Neurological: Negative for weakness and headaches.    Allergies  Dilaudid; Pregabalin; Metoclopramide hcl; Neurontin; and Other  Home Medications   Current Outpatient Rx  Name  Route  Sig  Dispense  Refill  . ALBUTEROL SULFATE HFA 108 (90 BASE) MCG/ACT IN AERS   Inhalation   Inhale 2 puffs into the lungs as needed.         . ALISKIREN FUMARATE 300 MG PO TABS   Oral   Take 1 tablet (300 mg total) by mouth daily.   30 tablet   5   . ALLOPURINOL 300 MG PO TABS      1/2 tab every day         . ALPRAZOLAM 1  MG PO TABS   Oral   Take 1 tablet (1 mg total) by mouth as needed.   30 tablet   5   . BLACK COHOSH PO   Oral   Take 1 tablet by mouth as needed.         Marland Kitchen CETIRIZINE HCL 10 MG PO TABS   Oral   Take 10 mg by mouth daily.         . CYCLOBENZAPRINE HCL 10 MG PO TABS   Oral   Take 10 mg by mouth daily.         . FUROSEMIDE 20 MG PO TABS   Oral   Take 20 mg by mouth every other day.         Marland Kitchen HYDROCODONE-ACETAMINOPHEN 10-500 MG PO TABS   Oral   Take 1 tablet by mouth as needed.         . IBUPROFEN 600 MG PO TABS   Oral   Take 600 mg by mouth as needed.         Marland Kitchen LAMOTRIGINE 100 MG PO TABS   Oral   Take 0.5 tablets (50 mg total) by mouth daily.   30 tablet   11   . PANCRELIPASE  (LIP-PROT-AMYL) 12000 UNITS PO CPEP   Oral   Take 3 capsules by mouth 3 (three) times daily before meals.         Marland Kitchen METOPROLOL SUCCINATE ER 100 MG PO TB24   Oral   Take 1 tablet (100 mg total) by mouth daily. Take with or immediately following a meal.   30 tablet   11   . MORPHINE SULFATE ER 30 MG PO CP24   Oral   Take 30 mg by mouth 3 (three) times daily.         Marland Kitchen ONE-DAILY MULTI VITAMINS PO TABS   Oral   Take 1 tablet by mouth daily.         Marland Kitchen OMEPRAZOLE 40 MG PO CPDR   Oral   Take 40 mg by mouth 2 (two) times daily.         Marland Kitchen SPIRONOLACTONE 100 MG PO TABS   Oral   Take 1 tablet (100 mg total) by mouth daily.   30 tablet   11     BP 117/79  Pulse 97  Temp 99.1 F (37.3 C) (Oral)  Resp 18  Ht 5\' 4"  (1.626 m)  Wt 299 lb (135.626 kg)  BMI 51.32 kg/m2  SpO2 100%  Physical Exam  Constitutional: She is oriented to person, place, and time. She appears well-developed and well-nourished. No distress.  HENT:  Head: Normocephalic and atraumatic.  Mouth/Throat: Oropharynx is clear and moist. No oropharyngeal exudate.  Eyes: Conjunctivae normal and EOM are normal. Pupils are equal, round, and reactive to light.  Neck: Normal range of motion. Neck supple.  Cardiovascular: Normal rate, regular rhythm and normal heart sounds.   No murmur heard. Pulmonary/Chest: Effort normal and breath sounds normal. No respiratory distress.  Abdominal: Soft. There is tenderness. There is no rebound and no guarding.       Obese, diffuse abdominal tenderness without guarding or rebound.  Musculoskeletal: Normal range of motion. She exhibits no edema and no tenderness.  Neurological: She is alert and oriented to person, place, and time. No cranial nerve deficit. She exhibits normal muscle tone. Coordination normal.  Skin: Skin is warm.    ED Course  Procedures (including critical care time)  Labs Reviewed  CBC WITH  DIFFERENTIAL - Abnormal; Notable for the following:    WBC 19.5  (*)     Neutrophils Relative 78 (*)     Neutro Abs 15.1 (*)     All other components within normal limits  COMPREHENSIVE METABOLIC PANEL - Abnormal; Notable for the following:    Glucose, Bld 101 (*)     Creatinine, Ser 1.32 (*)     GFR calc non Af Amer 46 (*)     GFR calc Af Amer 53 (*)     All other components within normal limits  URINALYSIS, MICROSCOPIC ONLY - Abnormal; Notable for the following:    APPearance CLOUDY (*)     Ketones, ur TRACE (*)     Leukocytes, UA TRACE (*)     Squamous Epithelial / LPF FEW (*)     All other components within normal limits  LIPASE, BLOOD  LACTIC ACID, PLASMA   Ct Abdomen Pelvis W Contrast  04/28/2012  *RADIOLOGY REPORT*  Clinical Data: Abdominal pain.  Rectal bleeding.  CT ABDOMEN AND PELVIS WITH CONTRAST  Technique:  Multidetector CT imaging of the abdomen and pelvis was performed following the standard protocol during bolus administration of intravenous contrast.  Contrast: OMNIPAQUE IOHEXOL 300 MG/ML  SOLN  Comparison: 11/21/2011.  Findings: Lung Bases: Normal.  Liver:  Normal.  Spleen:  Normal.  Gallbladder:  Cholecystectomy.  Common bile duct:  Normal.  Pancreas:  Normal.  Adrenal glands:  Normal.  Kidneys:  Normal enhancement and excretion of contrast.  Stomach:  Normal.  Small bowel:  Normal.  No mesenteric adenopathy.  No inflammatory changes.  Colon:   Normal appendix.  The ascending colon is normal. Transverse colon is also normal. There is colitis of the descending colon and sigmoid colon.  The rectum appears within normal limits.  Pelvic Genitourinary:  Normal appearance of the urinary bladder, uterus and adnexa.  No free fluid.  No pelvic adenopathy.  Bones:  No aggressive osseous lesions.  Bilateral sacroiliac joint degenerative disease.  Vasculature: Normal.  IMPRESSION: 1.  Descending and proximal sigmoid colitis.  Differential considerations include infection and inflammatory bowel disease. Ischemia considered unlikely. 2.   Cholecystectomy.   Original Report Authenticated By: Andreas Newport, M.D.    Dg Abd 2 Views  04/28/2012  *RADIOLOGY REPORT*  Clinical Data: Abdominal cramping.  Pain.  Bloody stool.  ABDOMEN - 2 VIEW  Comparison: 09/22/2010 CT at [facility] imaging.  Findings: Normal bowel gas pattern. Cholecystectomy clips are present in the right upper quadrant.  No gross plain film evidence of free air.  Relative paucity of distal bowel gas.  There may be a tiny amount of air in the rectum.  IMPRESSION: No acute abnormality.   Original Report Authenticated By: Andreas Newport, M.D.      No diagnosis found.    MDM  Diffuse abdominal pain with bloody stools leukocytosis. Abdomen is nonsurgical on exam. We'll obtain labs, treat symptoms, CT scan.  CT scan shows colitis of sigmoid and descending colon. Patient remains uncomfortable, tachycardic with significant cytosis. Will start Cipro and Flagyl. Doubt ischemic pathology of colitis lactate normal. Abdomen remains tender without peritoneal signs.    Glynn Octave, MD 04/28/12 [phone]

## 2012-04-28 NOTE — Patient Instructions (Addendum)
Leukocytosis Leukocytosis means you have more white blood cells than normal. White blood cells are made in your bone marrow. The main job of white blood cells is to fight infection. Having too many white blood cells is a common condition. It can develop as a result of many types of medical problems. CAUSES  In some cases, your bone marrow may be normal, but it is still making too many white blood cells. This could be the result of:  Infection.  Injury.  Physical stress.  Emotional stress.  Surgery.  Allergic reactions.  Tumors that do not start in the blood or bone marrow.  An inherited disease.  Certain medicines.  Pregnancy and labor. In other cases, you may have a bone marrow disorder that is causing your body to make too many white blood cells. Bone marrow disorders include:  Leukemia. This is a type of blood cancer.  Myeloproliferative disorders. These disorders cause blood cells to grow abnormally. SYMPTOMS  Some people have no symptoms. Others have symptoms due to the medical problem that is causing their leukocytosis. These symptoms may include:  Bleeding.  Bruising.  Fever.  Night sweats.  Repeated infections.  Weakness.  Weight loss. DIAGNOSIS  Leukocytosis is often found during blood tests that are done as part of a normal physical exam. Your caregiver will probably order other tests to help determine why you have too many white blood cells. These tests may include:  A complete blood count (CBC). This test measures all the types of blood cells in your body.  Chest X-rays, urine tests (urinalysis), or other tests to look for signs of infection.  Bone marrow aspiration. For this test, a needle is put into your bone. Cells from the bone marrow are removed through the needle. The cells are then examined under a microscope. TREATMENT  Treatment is usually not needed for leukocytosis. However, if a disorder is causing your leukocytosis, it will need to be  treated. Treatment may include:  Antibiotic medicines if you have a bacterial infection.  Bone marrow transplant. Your diseased bone marrow is replaced with healthy cells that will grow new bone marrow.  Chemotherapy. This is the use of drugs to kill cancer cells. HOME CARE INSTRUCTIONS  Only take over-the-counter or prescription medicines as directed by your caregiver.  Maintain a healthy weight. Ask your caregiver what weight is best for you.  Eat foods that are low in saturated fats and high in fiber. Eat plenty of fruits and vegetables.  Drink enough fluids to keep your urine clear or pale yellow.  Get 30 minutes of exercise at least 5 times a week. Check with your caregiver before starting a new exercise routine.  Limit caffeine and alcohol.  Do not smoke.  Keep all follow-up appointments as directed by your caregiver. SEEK MEDICAL CARE IF:  You feel weak or more tired than usual.  You develop chills, a cough, or nasal congestion.  You lose weight without trying.  You have night sweats.  You bruise easily. SEEK IMMEDIATE MEDICAL CARE IF:  You bleed more than normal.  You have chest pain.  You have trouble breathing.  You have a fever.  You have uncontrolled nausea or vomiting.  You feel dizzy or lightheaded. MAKE SURE YOU:  Understand these instructions.  Will watch your condition.  Will get help right away if you are not doing well or get worse. Document Released: 05/18/2011 Document Revised: 08/21/2011 Document Reviewed: 05/18/2011 [facility] Patient Information 2013 [place], [place]. Gastrointestinal Bleeding Gastrointestinal (  GI) bleeding means there is bleeding somewhere along the digestive tract, between the mouth and anus. CAUSES  There are many different problems that can cause GI bleeding. Possible causes include:  Esophagitis. This is inflammation, irritation, or swelling of the esophagus.  Hemorrhoids.These are veins that are full of blood  (engorged) in the rectum. They cause pain, inflammation, and may bleed.  Anal fissures.These are areas of painful tearing which may bleed. They are often caused by passing hard stool.  Diverticulosis.These are pouches that form on the colon over time, with age, and may bleed significantly.  Diverticulitis.This is inflammation in areas with diverticulosis. It can cause pain, fever, and bloody stools, although bleeding is rare.  Polyps and cancer. Colon cancer often starts out as precancerous polyps.  Gastritis and ulcers.Bleeding from the upper gastrointestinal tract (near the stomach) may travel through the intestines and produce black, sometimes tarry, often bad smelling stools. In certain cases, if the bleeding is fast enough, the stools may not be black, but red. This condition may be life-threatening. SYMPTOMS   Vomiting bright red blood or material that looks like coffee grounds.  Bloody, black, or tarry stools. DIAGNOSIS  Your caregiver may diagnose your condition by taking your history and performing a physical exam. More tests may be needed, including:  X-rays and other imaging tests.  Esophagogastroduodenoscopy (EGD). This test uses a flexible, lighted tube to look at your esophagus, stomach, and small intestine.  Colonoscopy. This test uses a flexible, lighted tube to look at your colon. TREATMENT  Treatment depends on the cause of your bleeding.   For bleeding from the esophagus, stomach, small intestine, or colon, the caregiver doing your EGD or colonoscopy may be able to stop the bleeding as part of the procedure.  Inflammation or infection of the colon can be treated with medicines.  Many rectal problems can be treated with creams, suppositories, or warm baths.  Surgery is sometimes needed.  Blood transfusions are sometimes needed if you have lost a lot of blood. If bleeding is slow, you may be allowed to go home. If there is a lot of bleeding, you will need to  stay in the hospital for observation. HOME CARE INSTRUCTIONS   Take any medicines exactly as prescribed.  Keep your stools soft by eating foods that are high in fiber. These foods include whole grains, legumes, fruits, and vegetables. Prunes (1 to 3 a day) work well for many people.  Drink enough fluids to keep your urine clear or pale yellow.  Take sitz baths if advised. A sitz bath is when you sit in a bathtub with warm water for 10 to 15 minutes to soak, soothe, and cleanse the rectal area. SEEK IMMEDIATE MEDICAL CARE IF:   Your bleeding increases.  You feel lightheaded, weak, or you faint.  You have severe cramps in your back or abdomen.  You pass large blood clots in your stool.  Your problems are getting worse. MAKE SURE YOU:   Understand these instructions.  Will watch your condition.  Will get help right away if you are not doing well or get worse. Document Released: 05/26/2000 Document Revised: 08/21/2011 Document Reviewed: 05/08/2011 [facility] Patient Information 2013 [place], [place].

## 2012-04-28 NOTE — H&P (Signed)
[facility] History and Physical  Chiara Coltrin ZOX:[id number] DOB: 19-Aug-1960 DOA: 04/28/2012  Referring physician: ED PCP: [name], MD  Specialists: Dr. Madilyn Fireman GI  Chief Complaint: Bloody Diarrhea  HPI: Ressie Slevin is a 51 y.o. female who presents with c/o bloody diarrhea.  Symptoms onset yesterday while shopping at [employer], described as severe.  Never had bloody BMs like this before in past despite long GI history of irritable bowel syndrome in past.  Has had numerous endoscopies as well in past, last colonoscopy appears to have been just last year from a chart review and an EGD even more recently.  Abd pain crampy in nature, associated with near syncopal episode yesterday.  At urgent care earlier today lab work demonstrated WBC 23k, so she was sent to ED, in ED lactate was not elevated, CT abd/pelvis demonstrated inflammation of sigmoid and descending colon probably infectious vs inflammatory.  Hospitalist has been asked to admit.  Review of Systems: Positive for fever, chills, generalized weakness, abdominal pain, 12 systems reviewed and otherwise negative.  Past Medical History  Diagnosis Date  . Hypertension   . Anxiety   . Mental disorder   . Depression   . Sleep apnea   . GERD (gastroesophageal reflux disease)   . Headache   . Neuromuscular disorder   . 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 Fibromyalgia   . Arthritis    Past Surgical History  Procedure Date  . Cholecystectomy   . Rotator cuff repair bilateral  . Nasal septoplasty w/ turbinoplasty   . Esophagogastroduodenoscopy 12/21/2011    Procedure: ESOPHAGOGASTRODUODENOSCOPY (EGD);  Surgeon: Barrie Folk, MD;  Location: [facility] ENDOSCOPY;  Service: Endoscopy;  Laterality: N/A;  . Gallbladder surgery    Social History:  reports that she has never smoked. She does not have any smokeless tobacco history on file. She reports that she does not drink alcohol or use illicit drugs.   Allergies    Allergen Reactions  . Dilaudid (Hydromorphone Hcl) Other (See Comments)    Headache   . Pregabalin   . Metoclopramide Hcl Other (See Comments)    "crawling inside"  . Neurontin (Gabapentin) Other (See Comments)    Tremors   . Other Other (See Comments)    Avoid all SSRI's and SNRI's- damaged pancreas    Family History  Problem Relation Age of Onset  . Heart disease Mother   . COPD Mother   . Arthritis Mother   . Cancer Mother   . Arthritis Paternal Grandmother   . Heart disease Paternal Grandmother   . Heart disease Paternal Grandfather     Prior to Admission medications   Medication Sig Start Date End Date Taking? Authorizing Provider  albuterol (PROVENTIL HFA;VENTOLIN HFA) 108 (90 BASE) MCG/ACT inhaler Inhale 2 puffs into the lungs as needed.   Yes Historical Provider, MD  aliskiren (TEKTURNA) 300 MG tablet Take 1 tablet (300 mg total) by mouth daily. 08/01/11  Yes [name], MD  allopurinol (ZYLOPRIM) 300 MG tablet 1/2 tab every day 08/23/11  Yes [name], MD  ALPRAZolam Prudy Feeler) 1 MG tablet Take 1 tablet (1 mg total) by mouth as needed. 08/01/11  Yes [name], MD  BLACK COHOSH PO Take 1 tablet by mouth as needed.   Yes Historical Provider, MD  cetirizine (ZYRTEC) 10 MG tablet Take 10 mg by mouth daily.   Yes Historical Provider, MD  cyclobenzaprine (FLEXERIL) 10 MG tablet Take 10 mg by mouth daily.   Yes  Historical Provider, MD  furosemide (LASIX) 20 MG tablet Take 20 mg by mouth every other day.   Yes Historical Provider, MD  HYDROcodone-acetaminophen (LORTAB) 10-500 MG per tablet Take 1 tablet by mouth as needed.   Yes Historical Provider, MD  ibuprofen (ADVIL,MOTRIN) 600 MG tablet Take 600 mg by mouth as needed.   Yes Historical Provider, MD  lamoTRIgine (LAMICTAL) 100 MG tablet Take 0.5 tablets (50 mg total) by mouth daily. 08/01/11  Yes [name], MD  lipase/protease/amylase (CREON-10/PANCREASE) 12000 UNITS CPEP Take 3 capsules by mouth 3 (three)  times daily before meals.   Yes Historical Provider, MD  metoprolol succinate (TOPROL-XL) 100 MG 24 hr tablet Take 1 tablet (100 mg total) by mouth daily. Take with or immediately following a meal. 08/01/11 07/31/12 Yes [name], MD  morphine (KADIAN) 30 MG 24 hr capsule Take 30 mg by mouth 3 (three) times daily.   Yes Historical Provider, MD  Multiple Vitamin (MULTIVITAMIN) tablet Take 1 tablet by mouth daily.   Yes Historical Provider, MD  omeprazole (PRILOSEC) 40 MG capsule Take 40 mg by mouth 2 (two) times daily.   Yes Historical Provider, MD  spironolactone (ALDACTONE) 100 MG tablet Take 1 tablet (100 mg total) by mouth daily. 08/01/11 07/31/12 Yes [name], MD   Physical Exam: Filed Vitals:   04/28/12 1724  BP: 117/79  Pulse: 97  Temp: 99.1 F (37.3 C)  TempSrc: Oral  Resp: 18  Height: 5\' 4"  (1.626 m)  Weight: 135.626 kg (299 lb)  SpO2: 100%     General:  NAD, resting comfortably in bed  Eyes: PEERLA EOMI  ENT: mucous membranes moist  Neck: supple w/o JVD  Cardiovascular: RRR w/o MRG  Respiratory: CTA B  Abdomen: soft, diffusely tender, obese, bs+  Skin: no rash nor lesion  Musculoskeletal: MAE, full ROM all 4 extremities  Psychiatric: normal tone and affect  Neurologic: AAOx3, grossly non-focal   Labs on Admission:  Basic Metabolic Panel:  Lab 04/28/12 4540  NA 136  K 4.2  CL 98  CO2 26  GLUCOSE 101*  BUN 17  CREATININE 1.32*  CALCIUM 9.4  MG --  PHOS --   Liver Function Tests:  Lab 04/28/12 1855  AST 17  ALT 14  ALKPHOS 48  BILITOT 0.3  PROT 7.6  ALBUMIN 3.9    Lab 04/28/12 1855  LIPASE 56  AMYLASE --   No results found for this basename: AMMONIA:5 in the last 168 hours CBC:  Lab 04/28/12 1855 04/28/12 1559  WBC 19.5* 23.9*  NEUTROABS 15.1* --  HGB 13.5 14.7  HCT 40.3 48.1*  MCV 86.5 92.4  PLT 389 --   Cardiac Enzymes: No results found for this basename: CKTOTAL:5,CKMB:5,CKMBINDEX:5,TROPONINI:5 in the last 168  hours  BNP (last 3 results) No results found for this basename: PROBNP:3 in the last 8760 hours CBG: No results found for this basename: GLUCAP:5 in the last 168 hours  Radiological Exams on Admission: Ct Abdomen Pelvis W Contrast  04/28/2012  *RADIOLOGY REPORT*  Clinical Data: Abdominal pain.  Rectal bleeding.  CT ABDOMEN AND PELVIS WITH CONTRAST  Technique:  Multidetector CT imaging of the abdomen and pelvis was performed following the standard protocol during bolus administration of intravenous contrast.  Contrast: OMNIPAQUE IOHEXOL 300 MG/ML  SOLN  Comparison: 11/21/2011.  Findings: Lung Bases: Normal.  Liver:  Normal.  Spleen:  Normal.  Gallbladder:  Cholecystectomy.  Common bile duct:  Normal.  Pancreas:  Normal.  Adrenal glands:  Normal.  Kidneys:  Normal enhancement and excretion of contrast.  Stomach:  Normal.  Small bowel:  Normal.  No mesenteric adenopathy.  No inflammatory changes.  Colon:   Normal appendix.  The ascending colon is normal. Transverse colon is also normal. There is colitis of the descending colon and sigmoid colon.  The rectum appears within normal limits.  Pelvic Genitourinary:  Normal appearance of the urinary bladder, uterus and adnexa.  No free fluid.  No pelvic adenopathy.  Bones:  No aggressive osseous lesions.  Bilateral sacroiliac joint degenerative disease.  Vasculature: Normal.  IMPRESSION: 1.  Descending and proximal sigmoid colitis.  Differential considerations include infection and inflammatory bowel disease. Ischemia considered unlikely. 2.  Cholecystectomy.   Original Report Authenticated By: Andreas Newport, M.D.    Dg Abd 2 Views  04/28/2012  *RADIOLOGY REPORT*  Clinical Data: Abdominal cramping.  Pain.  Bloody stool.  ABDOMEN - 2 VIEW  Comparison: 09/22/2010 CT at [facility] imaging.  Findings: Normal bowel gas pattern. Cholecystectomy clips are present in the right upper quadrant.  No gross plain film evidence of free air.  Relative paucity of distal  bowel gas.  There may be a tiny amount of air in the rectum.  IMPRESSION: No acute abnormality.   Original Report Authenticated By: Andreas Newport, M.D.     EKG: Independently reviewed.  Assessment/Plan Principal Problem:  *Colitis Active Problems:  Lower GI bleed  Leukocytosis   1. Colitis - most likely infectious given that she has had endoscopies in the past without evidence of IBD, but new onset IBD does remain a possibility.  No recent ABx courses per patient so probably not C.Diff but will check anyhow.  Starting Cipro/flagyl IV for treatment, iv fluids, holding diuretics at this time.  Could be ischemic as well but radiologist didn't think so and lactate not elevated. 2. LGIB - secondary to #1, not anemic at this point, recheck CBC with AM labs.  Fluids for now. 3. Leukocytosis - secondary to #1, does have subjective fever/chills but no objective findings to complete the second SIRS criteria and diagnosis of sepsis at this time in this patient.   Code Status: Full Code (must indicate code status--if unknown or must be presumed, indicate so) Family Communication: No family in room (indicate person spoken with, if applicable, with phone number if by telephone) Disposition Plan: Admit to obs (indicate anticipated LOS)  Time spent: 70 min  Asaad Gulley M. [facility] Pager [phone]  If 7PM-7AM, please contact night-coverage [email or web address] Password [facility] 04/28/2012, 10:37 PM

## 2012-04-29 DIAGNOSIS — D72829 Elevated white blood cell count, unspecified: Secondary | ICD-10-CM | POA: Diagnosis not present

## 2012-04-29 DIAGNOSIS — K922 Gastrointestinal hemorrhage, unspecified: Secondary | ICD-10-CM | POA: Diagnosis not present

## 2012-04-29 DIAGNOSIS — K5289 Other specified noninfective gastroenteritis and colitis: Secondary | ICD-10-CM | POA: Diagnosis not present

## 2012-04-29 LAB — BASIC METABOLIC PANEL
BUN: 11 mg/dL (ref 6–23)
Chloride: 102 mEq/L (ref 96–112)
Creatinine, Ser: 1.1 mg/dL (ref 0.50–1.10)
GFR calc Af Amer: 67 mL/min — ABNORMAL LOW (ref >90)
Glucose, Bld: 106 mg/dL — ABNORMAL HIGH (ref 70–99)

## 2012-04-29 LAB — CBC
HCT: 36.8 % (ref 36.0–46.0)
MCV: 88.2 fL (ref 78.0–100.0)
RBC: 4.17 MIL/uL (ref 3.87–5.11)
RDW: 13.8 % (ref 11.5–15.5)

## 2012-04-29 LAB — COMPREHENSIVE METABOLIC PANEL
ALT: 14 U/L (ref 0–35)
BUN: 16 mg/dL (ref 6–23)
CO2: 27 mEq/L (ref 19–32)
Creat: 1.37 mg/dL — ABNORMAL HIGH (ref 0.50–1.10)
Total Bilirubin: 0.4 mg/dL (ref 0.3–1.2)

## 2012-04-29 LAB — LIPASE: Lipase: 47 U/L (ref 0–75)

## 2012-04-29 LAB — GLUCOSE, CAPILLARY: Glucose-Capillary: 99 mg/dL (ref 70–99)

## 2012-04-29 MED ORDER — HYDROCODONE-ACETAMINOPHEN 5-325 MG PO TABS: 1.0000 | ORAL_TABLET | ORAL | Status: DC | PRN

## 2012-04-29 MED ORDER — ALPRAZOLAM 1 MG PO TABS: 1.0000 mg | ORAL_TABLET | ORAL | Status: DC | PRN

## 2012-04-29 NOTE — Progress Notes (Signed)
[facility] PROGRESS NOTE Assessment/Plan: Colitis (04/28/2012) - afebrile, cipro/flagyl 11.17 - minimal diarrhea. - CT abdomen: Descending and proximal sigmoid colitis  Lower GI bleed (04/28/2012) - 2/2 to number 1. - Hbg stable.  Leukocytosis (04/28/2012) - improving with antibiotics.   Code Status: full Family Communication: none  Disposition Plan: home in am  Consultants:  none  Procedures: Ct abdomen: Descending and proximal sigmoid colitis    Antibiotics:  Cipro/Flagyl 11.17  HPI/Subjective: Abdominal pain improved, minimal BM.   Objective: Filed Vitals:   04/28/12 1724 04/28/12 2359 04/29/12 0034 04/29/12 0646  BP: 117/79 131/97 128/87 116/65  Pulse: 97 97 90 81  Temp: 99.1 F (37.3 C) 98.2 F (36.8 C) 98.3 F (36.8 C) 98.8 F (37.1 C)  TempSrc: Oral Oral Oral Oral  Resp: 18  20 18   Height: 5\' 4"  (1.626 m)  5\' 4"  (1.626 m)   Weight: 135.626 kg (299 lb)  135.716 kg (299 lb 3.2 oz)   SpO2: 100% 95% 98% 95%    Intake/Output Summary (Last 24 hours) at 04/29/12 0749 Last data filed at 04/29/12 1610  Gross per 24 hour  Intake    975 ml  Output    600 ml  Net    375 ml   Filed Weights   04/28/12 1724 04/29/12 0034  Weight: 135.626 kg (299 lb) 135.716 kg (299 lb 3.2 oz)    Exam:  General: Alert, awake, oriented x3, in no acute distress.  HEENT: No bruits, no goiter.  Heart: Regular rate and rhythm, without murmurs, rubs, gallops.  Lungs: Good air movement, bilateral air movement.  Abdomen: Soft, mild tenderness on LLQ & LUQ nondistended, positive bowel sounds.  Neuro: Grossly intact, nonfocal.   Data Reviewed: Basic Metabolic Panel:  Lab 04/29/12 9604 04/28/12 1855  NA 136 136  K 4.0 4.2  CL 102 98  CO2 24 26  GLUCOSE 106* 101*  BUN 11 17  CREATININE 1.10 1.32*  CALCIUM 8.9 9.4  MG -- --  PHOS -- --   Liver Function Tests:  Lab 04/28/12 1855  AST 17  ALT 14  ALKPHOS 48  BILITOT 0.3  PROT 7.6  ALBUMIN 3.9    Lab  04/28/12 1855  LIPASE 56  AMYLASE --   No results found for this basename: AMMONIA:5 in the last 168 hours CBC:  Lab 04/29/12 0455 04/28/12 1855 04/28/12 1559  WBC 13.1* 19.5* 23.9*  NEUTROABS -- 15.1* --  HGB 12.0 13.5 14.7  HCT 36.8 40.3 48.1*  MCV 88.2 86.5 92.4  PLT 266 389 --   Cardiac Enzymes: No results found for this basename: CKTOTAL:5,CKMB:5,CKMBINDEX:5,TROPONINI:5 in the last 168 hours BNP (last 3 results) No results found for this basename: PROBNP:3 in the last 8760 hours CBG: No results found for this basename: GLUCAP:5 in the last 168 hours  No results found for this or any previous visit (from the past 240 hour(s)).   Studies: Ct Abdomen Pelvis W Contrast  04/28/2012  *RADIOLOGY REPORT*  Clinical Data: Abdominal pain.  Rectal bleeding.  CT ABDOMEN AND PELVIS WITH CONTRAST  Technique:  Multidetector CT imaging of the abdomen and pelvis was performed following the standard protocol during bolus administration of intravenous contrast.  Contrast: OMNIPAQUE IOHEXOL 300 MG/ML  SOLN  Comparison: 11/21/2011.  Findings: Lung Bases: Normal.  Liver:  Normal.  Spleen:  Normal.  Gallbladder:  Cholecystectomy.  Common bile duct:  Normal.  Pancreas:  Normal.  Adrenal glands:  Normal.  Kidneys:  Normal enhancement and  excretion of contrast.  Stomach:  Normal.  Small bowel:  Normal.  No mesenteric adenopathy.  No inflammatory changes.  Colon:   Normal appendix.  The ascending colon is normal. Transverse colon is also normal. There is colitis of the descending colon and sigmoid colon.  The rectum appears within normal limits.  Pelvic Genitourinary:  Normal appearance of the urinary bladder, uterus and adnexa.  No free fluid.  No pelvic adenopathy.  Bones:  No aggressive osseous lesions.  Bilateral sacroiliac joint degenerative disease.  Vasculature: Normal.  IMPRESSION: 1.  Descending and proximal sigmoid colitis.  Differential considerations include infection and inflammatory bowel  disease. Ischemia considered unlikely. 2.  Cholecystectomy.   Original Report Authenticated By: Andreas Newport, M.D.    Dg Abd 2 Views  04/28/2012  *RADIOLOGY REPORT*  Clinical Data: Abdominal cramping.  Pain.  Bloody stool.  ABDOMEN - 2 VIEW  Comparison: 09/22/2010 CT at [facility] imaging.  Findings: Normal bowel gas pattern. Cholecystectomy clips are present in the right upper quadrant.  No gross plain film evidence of free air.  Relative paucity of distal bowel gas.  There may be a tiny amount of air in the rectum.  IMPRESSION: No acute abnormality.   Original Report Authenticated By: Andreas Newport, M.D.     Scheduled Meds:   . sodium chloride   Intravenous STAT  . aliskiren  300 mg Oral Daily  . allopurinol  150 mg Oral Daily  . [COMPLETED] ciprofloxacin  400 mg Intravenous Once  . ciprofloxacin  400 mg Intravenous Q12H  . cyclobenzaprine  10 mg Oral Daily  . lamoTRIgine  50 mg Oral Daily  . lipase/protease/amylase  3 capsule Oral TID AC  . metoprolol succinate  100 mg Oral Daily  . [COMPLETED] metronidazole  500 mg Intravenous Once  . metronidazole  500 mg Intravenous Q8H  . morphine  30 mg Oral Q8H  . [COMPLETED]  morphine injection  4 mg Intravenous Once  . [COMPLETED]  morphine injection  4 mg Intravenous Once  . [COMPLETED] ondansetron (ZOFRAN) IV  4 mg Intravenous Once  . [COMPLETED] ondansetron (ZOFRAN) IV  4 mg Intravenous Once  . [COMPLETED] ondansetron (ZOFRAN) IV  4 mg Intravenous Once  . pantoprazole  80 mg Oral BID AC  . [COMPLETED] sodium chloride  1,000 mL Intravenous Once  . sodium chloride  3 mL Intravenous Q12H  . [DISCONTINUED] LORazepam  0.5 mg Oral Once   Continuous Infusions:    Marinda Elk  [facility] Pager [phone]. If 8PM-8AM, please contact night-coverage at [email or web address], password [facility] 04/29/2012, 7:49 AM  LOS: 1 day

## 2012-04-30 ENCOUNTER — Encounter (HOSPITAL_COMMUNITY): Payer: Self-pay

## 2012-04-30 DIAGNOSIS — E669 Obesity, unspecified: Secondary | ICD-10-CM

## 2012-04-30 DIAGNOSIS — F329 Major depressive disorder, single episode, unspecified: Secondary | ICD-10-CM | POA: Diagnosis present

## 2012-04-30 DIAGNOSIS — I10 Essential (primary) hypertension: Secondary | ICD-10-CM | POA: Diagnosis not present

## 2012-04-30 DIAGNOSIS — K55059 Acute (reversible) ischemia of intestine, part and extent unspecified: Secondary | ICD-10-CM | POA: Diagnosis not present

## 2012-04-30 DIAGNOSIS — K589 Irritable bowel syndrome without diarrhea: Secondary | ICD-10-CM | POA: Diagnosis present

## 2012-04-30 DIAGNOSIS — R109 Unspecified abdominal pain: Secondary | ICD-10-CM

## 2012-04-30 DIAGNOSIS — K559 Vascular disorder of intestine, unspecified: Secondary | ICD-10-CM | POA: Diagnosis not present

## 2012-04-30 DIAGNOSIS — D72829 Elevated white blood cell count, unspecified: Secondary | ICD-10-CM | POA: Diagnosis not present

## 2012-04-30 DIAGNOSIS — G4730 Sleep apnea, unspecified: Secondary | ICD-10-CM | POA: Diagnosis present

## 2012-04-30 DIAGNOSIS — R197 Diarrhea, unspecified: Secondary | ICD-10-CM | POA: Diagnosis not present

## 2012-04-30 DIAGNOSIS — K648 Other hemorrhoids: Secondary | ICD-10-CM | POA: Diagnosis present

## 2012-04-30 DIAGNOSIS — K922 Gastrointestinal hemorrhage, unspecified: Secondary | ICD-10-CM | POA: Diagnosis not present

## 2012-04-30 DIAGNOSIS — Z6841 Body Mass Index (BMI) 40.0 and over, adult: Secondary | ICD-10-CM | POA: Diagnosis not present

## 2012-04-30 DIAGNOSIS — Z79899 Other long term (current) drug therapy: Secondary | ICD-10-CM | POA: Diagnosis not present

## 2012-04-30 DIAGNOSIS — K219 Gastro-esophageal reflux disease without esophagitis: Secondary | ICD-10-CM | POA: Diagnosis present

## 2012-04-30 DIAGNOSIS — K5289 Other specified noninfective gastroenteritis and colitis: Secondary | ICD-10-CM | POA: Diagnosis not present

## 2012-04-30 DIAGNOSIS — D62 Acute posthemorrhagic anemia: Secondary | ICD-10-CM | POA: Diagnosis not present

## 2012-04-30 DIAGNOSIS — F3289 Other specified depressive episodes: Secondary | ICD-10-CM | POA: Diagnosis present

## 2012-04-30 DIAGNOSIS — D649 Anemia, unspecified: Secondary | ICD-10-CM | POA: Diagnosis not present

## 2012-04-30 LAB — CBC
Hemoglobin: 11.5 g/dL — ABNORMAL LOW (ref 12.0–15.0)
MCH: 28.7 pg (ref 26.0–34.0)
MCHC: 32.5 g/dL (ref 30.0–36.0)
Platelets: 279 10*3/uL (ref 150–400)
Platelets: 275 10*3/uL (ref 150–400)
RBC: 4 MIL/uL (ref 3.87–5.11)
RDW: 13.6 % (ref 11.5–15.5)
RDW: 13.6 % (ref 11.5–15.5)
WBC: 8.1 10*3/uL (ref 4.0–10.5)

## 2012-04-30 LAB — GLUCOSE, CAPILLARY: Glucose-Capillary: 107 mg/dL — ABNORMAL HIGH (ref 70–99)

## 2012-04-30 LAB — ABO/RH: ABO/RH(D): B POS

## 2012-04-30 LAB — TYPE AND SCREEN
ABO/RH(D): B POS
Antibody Screen: NEGATIVE

## 2012-04-30 LAB — PREPARE RBC (CROSSMATCH)

## 2012-04-30 MED ORDER — SACCHAROMYCES BOULARDII 250 MG PO CAPS
250.0000 mg | ORAL_CAPSULE | Freq: Two times a day (BID) | ORAL | Status: DC
  Administered 2012-04-30 – 2012-05-02 (×5): 250 mg via ORAL
  Filled 2012-04-30 (×6): qty 1

## 2012-04-30 MED ORDER — HYDROCODONE-ACETAMINOPHEN 5-325 MG PO TABS: 1.0000 | ORAL_TABLET | ORAL | Status: DC | PRN

## 2012-04-30 NOTE — Progress Notes (Signed)
[facility] PROGRESS NOTE Assessment/Plan: Colitis (04/28/2012) - afebrile, cipro/flagyl 11.17 - minimal diarrhea. Still with abdominal pain and blood in stools. - CT abdomen: Descending and proximal sigmoid colitis  Lower GI bleed (04/28/2012) - 2/2 to number 1. One Bloody BM. - Hbg trickling down. - Type and cross 2 units. Continue serial CBC. - GI consult, for possible colonoscopy.  Leukocytosis (04/28/2012) - improving with antibiotics.   Code Status: full Family Communication: none  Disposition Plan: home in am  Consultants:  none  Procedures: Ct abdomen: Descending and proximal sigmoid colitis    Antibiotics:  Cipro/Flagyl 11.17  HPI/Subjective: Abdominal pain improved, minimal BM.   Objective: Filed Vitals:   04/29/12 0646 04/29/12 1500 04/29/12 2110 04/30/12 0606  BP: 116/65 127/71 111/73 93/54  Pulse: 81 87 83 80  Temp: 98.8 F (37.1 C) 98.7 F (37.1 C) 97.9 F (36.6 C) 97.9 F (36.6 C)  TempSrc: Oral Oral Oral Oral  Resp: 18 18 18 18   Height:      Weight:      SpO2: 95% 97% 98% 99%    Intake/Output Summary (Last 24 hours) at 04/30/12 0809 Last data filed at 04/30/12 0500  Gross per 24 hour  Intake   1200 ml  Output   2650 ml  Net  -1450 ml   Filed Weights   04/28/12 1724 04/29/12 0034  Weight: 135.626 kg (299 lb) 135.716 kg (299 lb 3.2 oz)    Exam:  General: Alert, awake, oriented x3, in no acute distress.  HEENT: No bruits, no goiter.  Heart: Regular rate and rhythm, without murmurs, rubs, gallops.  Lungs: Good air movement, bilateral air movement.  Abdomen: Soft, mild tenderness on LLQ & LUQ nondistended, positive bowel sounds.  Neuro: Grossly intact, nonfocal.   Data Reviewed: Basic Metabolic Panel:  Lab 04/29/12 1610 04/28/12 1855 04/28/12 1551  NA 136 136 139  K 4.0 4.2 4.5  CL 102 98 100  CO2 24 26 27   GLUCOSE 106* 101* 111*  BUN 11 17 16   CREATININE 1.10 1.32* 1.37*  CALCIUM 8.9 9.4 10.2  MG -- -- --    PHOS -- -- --   Liver Function Tests:  Lab 04/28/12 1855 04/28/12 1551  AST 17 10  ALT 14 14  ALKPHOS 48 53  BILITOT 0.3 0.4  PROT 7.6 7.5  ALBUMIN 3.9 5.0    Lab 04/28/12 1855 04/28/12 1551  LIPASE 56 47  AMYLASE -- --   No results found for this basename: AMMONIA:5 in the last 168 hours CBC:  Lab 04/29/12 0455 04/28/12 1855 04/28/12 1559  WBC 13.1* 19.5* 23.9*  NEUTROABS -- 15.1* --  HGB 12.0 13.5 14.7  HCT 36.8 40.3 48.1*  MCV 88.2 86.5 92.4  PLT 266 389 --   Cardiac Enzymes: No results found for this basename: CKTOTAL:5,CKMB:5,CKMBINDEX:5,TROPONINI:5 in the last 168 hours BNP (last 3 results) No results found for this basename: PROBNP:3 in the last 8760 hours CBG:  Lab 04/30/12 0740 04/29/12 0811  GLUCAP 107* 99    No results found for this or any previous visit (from the past 240 hour(s)).   Studies: Ct Abdomen Pelvis W Contrast  04/28/2012  *RADIOLOGY REPORT*  Clinical Data: Abdominal pain.  Rectal bleeding.  CT ABDOMEN AND PELVIS WITH CONTRAST  Technique:  Multidetector CT imaging of the abdomen and pelvis was performed following the standard protocol during bolus administration of intravenous contrast.  Contrast: OMNIPAQUE IOHEXOL 300 MG/ML  SOLN  Comparison: 11/21/2011.  Findings: Lung Bases:  Normal.  Liver:  Normal.  Spleen:  Normal.  Gallbladder:  Cholecystectomy.  Common bile duct:  Normal.  Pancreas:  Normal.  Adrenal glands:  Normal.  Kidneys:  Normal enhancement and excretion of contrast.  Stomach:  Normal.  Small bowel:  Normal.  No mesenteric adenopathy.  No inflammatory changes.  Colon:   Normal appendix.  The ascending colon is normal. Transverse colon is also normal. There is colitis of the descending colon and sigmoid colon.  The rectum appears within normal limits.  Pelvic Genitourinary:  Normal appearance of the urinary bladder, uterus and adnexa.  No free fluid.  No pelvic adenopathy.  Bones:  No aggressive osseous lesions.  Bilateral  sacroiliac joint degenerative disease.  Vasculature: Normal.  IMPRESSION: 1.  Descending and proximal sigmoid colitis.  Differential considerations include infection and inflammatory bowel disease. Ischemia considered unlikely. 2.  Cholecystectomy.   Original Report Authenticated By: Andreas Newport, M.D.    Dg Abd 2 Views  04/28/2012  *RADIOLOGY REPORT*  Clinical Data: Abdominal cramping.  Pain.  Bloody stool.  ABDOMEN - 2 VIEW  Comparison: 09/22/2010 CT at [facility] imaging.  Findings: Normal bowel gas pattern. Cholecystectomy clips are present in the right upper quadrant.  No gross plain film evidence of free air.  Relative paucity of distal bowel gas.  There may be a tiny amount of air in the rectum.  IMPRESSION: No acute abnormality.   Original Report Authenticated By: Andreas Newport, M.D.     Scheduled Meds:    . [COMPLETED] sodium chloride   Intravenous STAT  . aliskiren  300 mg Oral Daily  . allopurinol  150 mg Oral Daily  . ciprofloxacin  400 mg Intravenous Q12H  . cyclobenzaprine  10 mg Oral Daily  . lamoTRIgine  50 mg Oral Daily  . lipase/protease/amylase  3 capsule Oral TID AC  . metoprolol succinate  100 mg Oral Daily  . metronidazole  500 mg Intravenous Q8H  . morphine  30 mg Oral Q8H  . pantoprazole  80 mg Oral BID AC  . sodium chloride  3 mL Intravenous Q12H   Continuous Infusions:    Marinda Elk  [facility] Pager [phone]. If 8PM-8AM, please contact night-coverage at [email or web address], password [facility] 04/30/2012, 8:09 AM  LOS: 2 days

## 2012-04-30 NOTE — Consult Note (Signed)
Hospital Admission Note Date: 04/30/2012  Patient name: Valerie [name] Medical record number: [id number] Date of birth: 12-29-1960 Age: 51 y.o. Gender: female PCP: [name], MD Referral: Dr. Robb Matar  Chief Complaint: abdominal pain and bloody diarrhea  History of Present Illness: Valerie [name] is a pleasant 51 year old woman with past medical history of PTSD, hypertension, depression, GERD, IBS with seldom loose stool, arthritis, fibromyalgia presented to the ED for sudden onset of severe abdominal cramping and bloody diarrhea that started on Saturday evening.   Prior to admission, patient had a barbecue sandwich and salad for dinner and went to [employer] around 7:30 PM where she felt the sudden onset of abdominal pain that was intermittent and 9 point 5/10 in severity. The pain was sharp in quality associated with profuse sweating, chills, and felt lightheaded and almost blacked out. She also started to notice some loose stool in the beginning which eventually developed into bloody diarrhea. She reports about 10 episodes of loose stools. The blood was about several tablespoons. Patient presented to the urgent care and Sunday and was found to have an elevated white count and she subsequently was sent to the emergency room. She denies any similar previous episodes .    Since hospitalization, patient states that her bowel movement has improved but will loose that the bloody bowel movement has also improved significantly. Denies any recent travel or eating seafood. No family history of colon cancer. Her mother recently had a perforated diverticulitis.  Meds: Prescriptions prior to admission  Medication Sig Dispense Refill  . albuterol (PROVENTIL HFA;VENTOLIN HFA) 108 (90 BASE) MCG/ACT inhaler Inhale 2 puffs into the lungs as needed.      Marland Kitchen aliskiren (TEKTURNA) 300 MG tablet Take 1 tablet (300 mg total) by mouth daily.  30 tablet  5  . allopurinol (ZYLOPRIM) 300 MG tablet 1/2 tab every day      .  ALPRAZolam (XANAX) 1 MG tablet Take 1 tablet (1 mg total) by mouth as needed.  30 tablet  5  . BLACK COHOSH PO Take 1 tablet by mouth as needed.      . cetirizine (ZYRTEC) 10 MG tablet Take 10 mg by mouth daily.      . cyclobenzaprine (FLEXERIL) 10 MG tablet Take 10 mg by mouth daily.      . furosemide (LASIX) 20 MG tablet Take 20 mg by mouth every other day.      Marland Kitchen HYDROcodone-acetaminophen (LORTAB) 10-500 MG per tablet Take 1 tablet by mouth as needed.      Marland Kitchen ibuprofen (ADVIL,MOTRIN) 600 MG tablet Take 600 mg by mouth as needed.      . lamoTRIgine (LAMICTAL) 100 MG tablet Take 0.5 tablets (50 mg total) by mouth daily.  30 tablet  11  . lipase/protease/amylase (CREON-10/PANCREASE) 12000 UNITS CPEP Take 3 capsules by mouth 3 (three) times daily before meals.      . metoprolol succinate (TOPROL-XL) 100 MG 24 hr tablet Take 1 tablet (100 mg total) by mouth daily. Take with or immediately following a meal.  30 tablet  11  . morphine (KADIAN) 30 MG 24 hr capsule Take 30 mg by mouth 3 (three) times daily.      . Multiple Vitamin (MULTIVITAMIN) tablet Take 1 tablet by mouth daily.      Marland Kitchen omeprazole (PRILOSEC) 40 MG capsule Take 40 mg by mouth 2 (two) times daily.      Marland Kitchen spironolactone (ALDACTONE) 100 MG tablet Take 1 tablet (100 mg total) by mouth daily.  30 tablet  11    Allergies: Allergies as of 04/28/2012 - Review Complete 04/28/2012  Allergen Reaction Noted  . Dilaudid (hydromorphone hcl) Other (See Comments) 08/01/2011  . Pregabalin  08/01/2011  . Metoclopramide hcl Other (See Comments) 08/01/2011  . Neurontin (gabapentin) Other (See Comments) 08/01/2011  . Other Other (See Comments) 08/01/2011   Past Medical History  Diagnosis Date  . Hypertension   . Anxiety   . Mental disorder   . Depression   . Sleep apnea   . GERD (gastroesophageal reflux disease)   . Headache   . Neuromuscular disorder   . 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 Fibromyalgia   . Arthritis    Past  Surgical History  Procedure Date  . Cholecystectomy   . Rotator cuff repair bilateral  . Nasal septoplasty w/ turbinoplasty   . Esophagogastroduodenoscopy 12/21/2011    Procedure: ESOPHAGOGASTRODUODENOSCOPY (EGD);  Surgeon: Barrie Folk, MD;  Location: [facility];  Service: Endoscopy;  Laterality: N/A;  . Gallbladder surgery    Family History  Problem Relation Age of Onset  . Heart disease Mother   . COPD Mother   . Arthritis Mother   . Cancer Mother   . Arthritis Paternal Grandmother   . Heart disease Paternal Grandmother   . Heart disease Paternal Grandfather    History   Social History  . Marital Status: Single    Spouse Name: N/A    Number of Children: N/A  . Years of Education: N/A   Occupational History  . Not on file.   Social History Main Topics  . Smoking status: Never Smoker   . Smokeless tobacco: Not on file  . Alcohol Use: No  . Drug Use: No  . Sexually Active: Yes   Other Topics Concern  . Not on file   Social History Narrative  . No narrative on file    Review of Systems: As per HPI  Physical Exam: Blood pressure 93/54, pulse 80, temperature 97.9 F (36.6 C), temperature source Oral, resp. rate 18, height 5\' 4"  (1.626 m), weight 299 lb 3.2 oz (135.716 kg), SpO2 99.00%. General: alert, well-developed, and cooperative to examination.  Head: normocephalic and atraumatic.  Eyes: vision grossly intact, pupils equal, pupils round, pupils reactive to light, no injection and anicteric.  Mouth: pharynx pink and moist, no erythema, and no exudates.  Neck: supple, full ROM, no thyromegaly Lungs: normal respiratory effort, no accessory muscle use, normal breath sounds, no crackles, and no wheezes. Heart: normal rate, regular rhythm, no murmur, no gallop, and no rub.  Abdomen: obese,soft, diffused tender to palpation of abdomen especially in the lower region and RUQ, normal bowel sounds, no distention, no guarding, no rebound tenderness, no hepatomegaly, and  no splenomegaly.  Msk: no joint swelling, no joint warmth, and no redness over joints.  Pulses: 2+ DP/PT pulses bilaterally Extremities: No cyanosis, clubbing, +1 pitting edema Neurologic: alert & oriented X3, cranial nerves II-XII intact, strength normal in all extremities, sensation intact to light touch Skin: turgor normal and no rashes.  Psych: Oriented X3, memory intact for recent and remote, normally interactive, good eye contact, not anxious appearing, and not depressed appearing.   Lab results: Basic Metabolic Panel:  Basename 04/29/12 0455 04/28/12 1855  NA 136 136  K 4.0 4.2  CL 102 98  CO2 24 26  GLUCOSE 106* 101*  BUN 11 17  CREATININE 1.10 1.32*  CALCIUM 8.9 9.4  MG -- --  PHOS -- --  Liver Function Tests:  Basename 04/28/12 1855 04/28/12 1551  AST 17 10  ALT 14 14  ALKPHOS 48 53  BILITOT 0.3 0.4  PROT 7.6 7.5  ALBUMIN 3.9 5.0    Basename 04/28/12 1855 04/28/12 1551  LIPASE 56 47  AMYLASE -- --   CBC:  Basename 04/30/12 0840 04/29/12 0455 04/28/12 1855  WBC 8.8 13.1* --  NEUTROABS -- -- 15.1*  HGB 11.5* 12.0 --  HCT 35.4* 36.8 --  MCV 88.3 88.2 --  PLT 279 266 --   CBG:  Basename 04/30/12 0740 04/29/12 0811  GLUCAP 107* 99   Urinalysis:  Basename 04/28/12 1840  COLORURINE YELLOW  LABSPEC 1.024  PHURINE 5.5  GLUCOSEU NEGATIVE  HGBUR NEGATIVE  BILIRUBINUR NEGATIVE  KETONESUR TRACE*  PROTEINUR NEGATIVE  UROBILINOGEN 0.2  NITRITE NEGATIVE  LEUKOCYTESUR TRACE*   Imaging results:  Ct Abdomen Pelvis W Contrast  04/28/2012  *RADIOLOGY REPORT*  Clinical Data: Abdominal pain.  Rectal bleeding.  CT ABDOMEN AND PELVIS WITH CONTRAST  Technique:  Multidetector CT imaging of the abdomen and pelvis was performed following the standard protocol during bolus administration of intravenous contrast.  Contrast: OMNIPAQUE IOHEXOL 300 MG/ML  SOLN  Comparison: 11/21/2011.  Findings: Lung Bases: Normal.  Liver:  Normal.  Spleen:  Normal.   Gallbladder:  Cholecystectomy.  Common bile duct:  Normal.  Pancreas:  Normal.  Adrenal glands:  Normal.  Kidneys:  Normal enhancement and excretion of contrast.  Stomach:  Normal.  Small bowel:  Normal.  No mesenteric adenopathy.  No inflammatory changes.  Colon:   Normal appendix.  The ascending colon is normal. Transverse colon is also normal. There is colitis of the descending colon and sigmoid colon.  The rectum appears within normal limits.  Pelvic Genitourinary:  Normal appearance of the urinary bladder, uterus and adnexa.  No free fluid.  No pelvic adenopathy.  Bones:  No aggressive osseous lesions.  Bilateral sacroiliac joint degenerative disease.  Vasculature: Normal.  IMPRESSION: 1.  Descending and proximal sigmoid colitis.  Differential considerations include infection and inflammatory bowel disease. Ischemia considered unlikely. 2.  Cholecystectomy.   Original Report Authenticated By: Andreas Newport, M.D.    Dg Abd 2 Views  04/28/2012  *RADIOLOGY REPORT*  Clinical Data: Abdominal cramping.  Pain.  Bloody stool.  ABDOMEN - 2 VIEW  Comparison: 09/22/2010 CT at [facility].  Findings: Normal bowel gas pattern. Cholecystectomy clips are present in the right upper quadrant.  No gross plain film evidence of free air.  Relative paucity of distal bowel gas.  There may be a tiny amount of air in the rectum.  IMPRESSION: No acute abnormality.   Original Report Authenticated By: Andreas Newport, M.D.     Assessment & Plan by Problem:  1.Colitis  2.Lower GI bleed  3.Leukocytosis  Valerie [name] is a 51 yo woman with significant past medical history of chronic pancreatitis induced by Effexor and Seroquel per patient report, multiple endoscopies as well as colonoscopies most recent EGD by Dr. Madilyn Fireman for her gastroparesis with Botox injection presents with severe abdominal cramping and bloody diarrhea which is consistent with ischemic colitis. Her CT abdomen and pelvis also demonstrate descending and  proximal sigmoid colitis which is segmental and consistent with ischemic colitis. Clinically, patient is improving with Cipro and Flagyl therapy and that her leukocytosis is also resolved. She probably does not need long-term antibiotics which will most likely give her diarrhea as a side effect. We favor starting her on probiotic such as Florastor.  Far as scoping, will discuss with the patient if she would like a diagnostic colonoscopy confirmed the diagnosis. Will discuss with GI attending, Dr. Matthias Hughs.  Signed: HO,MICHELE 04/30/2012, 12:27 PM

## 2012-04-30 NOTE — Consult Note (Signed)
Please see the note by Dr. Anselm Jungling, medical resident. I have examined the patient, obtained a history, and corroborated Dr. Christie Nottingham history and physical. I have developed a plan of action with the patient.  We will initiate probiotic therapy to help prevent C. difficile infection while on antibiotic therapy in the hospital.   The patient would like to have colonoscopic evaluation to confirm our clinical impression of ischemic colitis. Accordingly, this will be arranged for Dr. [name] Schlein to do tomorrow, as an unprepped exam, under sedation, not necessarily doing a complete colonoscopy but at least far enough to examine the inflamed area.  History: Abrupt onset of abdominal pain beginning 3 nights ago, with loose stools and diarrhea that became bloody. Has had persistent lower, discomfort, cramps, and loose stools with some blood since that time. Feels better since hospital admission.  Physical exam:  anicteric, no pallor. No evident distress lying in bed. Chest and heart unremarkable. Abdomen obese but nontender at this time.  Labs: Reviewed. Pertinent for improving leukocytosis  CT: Report reviewed, consistent for distal colitis consistent with ischemia in the distribution of the distal IMA   Impression: Ischemic colitis, resolving  Plan: Probiotics and partial colonoscopy as described above  Valerie [name], M.D. [phone]

## 2012-05-01 ENCOUNTER — Encounter (HOSPITAL_COMMUNITY): Payer: Self-pay

## 2012-05-01 ENCOUNTER — Encounter (HOSPITAL_COMMUNITY)
Admission: EM | Disposition: A | Discharge status: Home / Self Care | Payer: Self-pay | Source: Home / Self Care | Attending: Internal Medicine

## 2012-05-01 DIAGNOSIS — I10 Essential (primary) hypertension: Secondary | ICD-10-CM

## 2012-05-01 DIAGNOSIS — D649 Anemia, unspecified: Secondary | ICD-10-CM | POA: Diagnosis present

## 2012-05-01 HISTORY — DX: Anemia, unspecified: D64.9

## 2012-05-01 HISTORY — PX: FLEXIBLE SIGMOIDOSCOPY: SHX5431

## 2012-05-01 LAB — CBC
HCT: 35.9 % — ABNORMAL LOW (ref 36.0–46.0)
MCH: 28.8 pg (ref 26.0–34.0)
MCHC: 32.6 g/dL (ref 30.0–36.0)
RDW: 13.5 % (ref 11.5–15.5)

## 2012-05-01 LAB — GLUCOSE, CAPILLARY: Glucose-Capillary: 102 mg/dL — ABNORMAL HIGH (ref 70–99)

## 2012-05-01 SURGERY — SIGMOIDOSCOPY, FLEXIBLE
Anesthesia: Moderate Sedation

## 2012-05-01 MED ORDER — DIPHENHYDRAMINE HCL 50 MG/ML IJ SOLN
INTRAMUSCULAR | Status: DC | PRN
  Administered 2012-05-01: 25 mg via INTRAVENOUS

## 2012-05-01 MED ORDER — MIDAZOLAM HCL 10 MG/2ML IJ SOLN
INTRAMUSCULAR | Status: DC | PRN
  Administered 2012-05-01 (×4): 2.5 mg via INTRAVENOUS

## 2012-05-01 MED ORDER — MIDAZOLAM HCL 10 MG/2ML IJ SOLN
INTRAMUSCULAR | Status: AC
  Filled 2012-05-01: qty 2

## 2012-05-01 MED ORDER — FENTANYL CITRATE 0.05 MG/ML IJ SOLN
INTRAMUSCULAR | Status: AC
  Filled 2012-05-01: qty 2

## 2012-05-01 MED ORDER — FENTANYL CITRATE 0.05 MG/ML IJ SOLN
INTRAMUSCULAR | Status: DC | PRN
  Administered 2012-05-01 (×4): 25 ug via INTRAVENOUS

## 2012-05-01 MED ORDER — DIPHENHYDRAMINE HCL 50 MG/ML IJ SOLN
INTRAMUSCULAR | Status: AC
  Filled 2012-05-01: qty 1

## 2012-05-01 NOTE — Op Note (Signed)
[facility] [address] [place], [address]   FLEXIBLE SIGMOIDOSCOPY PROCEDURE REPORT  PATIENT: Valerie, [name]  MR#: [id number] BIRTHDATE: February 05, 1961 , 51  yrs. old GENDER: Female ENDOSCOPIST: Vida Rigger, MD REFERRED BY: PROCEDURE DATE:  05/01/2012 PROCEDURE:   Sigmoidoscopy with biopsy ASA CLASS:   Class II INDICATIONS:confirm ischemic colitis. MEDICATIONS: Fentanyl 100 mcg IV and Versed 10 mg IV   Benadryl 25 mg  DESCRIPTION OF PROCEDURE:   After the risks benefits and alternatives of the procedure were thoroughly explained, informed consent was obtained.  revealed hemorrhoids. The pentax [id number] Upper endoscope was introduced through the anus  and advanced to the mid transverse colon , limited by No adverse events experienced.   The quality of the prep was fair .We only washed enough to get adequate visualization  The instrument was then slowly withdrawn as the mucosa was fully examined.There was fairly obvious mild descending colon ischemic colitis status post biopsy with a normal sigmoid and a normal distal transverse colon and on slow withdrawal no additional findings were seen and once back in the rectum anorectal pull through in retroflexion confirmed some small hemorrhoids and the scope was straightened and readvanced shortage at the left side of the colon air was suctioned scope removed patient tolerated the procedure well there was no obvious immediate       COLON FINDINGS: Probable ischemic colitis in the descending status post biopsy. Retroflexed views revealed internal hemorrhoid.    The scope was then withdrawn from the patient and the procedure terminated.Otherwise within normal limits to the mid transverse colon with only fair visualization D2 unprepped nature of the exam  COMPLICATIONS: There were no complications.  ENDOSCOPIC IMPRESSION: Probable ischemic colitis in the descending status post biopsy  RECOMMENDATIONS:Okay to  advance diet and hopefully go home soon and will call with biopsy results and she can follow up with her primary gastroenterologist my partner Dr. Madilyn Fireman in 1-2 weeks or sooner when necessary   REPEAT EXAM:   _______________________________ eSigned:  Vida Rigger, MD 05/01/2012 12:27 PM   CC:  PATIENT NAME:  Valerie, [name] MR#: [id number]

## 2012-05-01 NOTE — Progress Notes (Addendum)
[facility] PROGRESS NOTE Assessment/Plan: Colitis Infectious versus ischemic. Afebrile since 04/28/2012. On cipro/flagyl since 04/28/2012. Minimal diarrhea. Still with abdominal pain and blood in stools. CT abdomen and pelvis on 04/28/2012 showed descending and proximal sigmoid colitis.  GI on board, considering possible colonoscopy versus flexible sigmoidoscopy today.  Lower GI bleed/anemia  Secondary to colitis, hemoglobin trending down but has not required transfusion so far.  Continue to monitor.  Leukocytosis Resolved, presumed to be from colitis.  Depression Stable.  Hypertension Stable.  Continue metoprolol.  GERD Continue PPI.  Obesity Diet and exercise as outpatient.  Code Status: full Family Communication: none  Disposition Plan: home when stable per GI.  Consultants:  Deboraha Sprang, GI.  Procedures:  Ct abdomen and pelvis on 04/28/2012: Descending and proximal sigmoid colitis  Antibiotics:  Cipro/Flagyl 04/28/2012  HPI/Subjective: Complaining of abdominal pain this morning.   Objective: Filed Vitals:   04/30/12 1423 04/30/12 1546 04/30/12 2100 05/01/12 0613  BP: 90/59 122/81 99/65 113/76  Pulse: 72 75 68 74  Temp: 97.9 F (36.6 C)  98.1 F (36.7 C) 97.4 F (36.3 C)  TempSrc: Oral  Oral Oral  Resp: 16  17 16   Height:      Weight:      SpO2: 97% 97% 100% 97%    Intake/Output Summary (Last 24 hours) at 05/01/12 0830 Last data filed at 04/30/12 1923  Gross per 24 hour  Intake   1000 ml  Output      0 ml  Net   1000 ml   Filed Weights   04/28/12 1724 04/29/12 0034  Weight: 135.626 kg (299 lb) 135.716 kg (299 lb 3.2 oz)    Exam: Physical Exam: General: Awake, Oriented, No acute distress. HEENT: EOMI. Neck: Supple CV: S1 and S2 Lungs: Clear to ascultation bilaterally Abdomen: Soft, mild tenderness to palpation in the lower quadrant below umbilicus, Nondistended, +bowel sounds. Ext: Good pulses. Trace edema.  Data Reviewed: Basic  Metabolic Panel:  Lab 04/29/12 1610 04/28/12 1855 04/28/12 1551  NA 136 136 139  K 4.0 4.2 4.5  CL 102 98 100  CO2 24 26 27   GLUCOSE 106* 101* 111*  BUN 11 17 16   CREATININE 1.10 1.32* 1.37*  CALCIUM 8.9 9.4 10.2  MG -- -- --  PHOS -- -- --   Liver Function Tests:  Lab 04/28/12 1855 04/28/12 1551  AST 17 10  ALT 14 14  ALKPHOS 48 53  BILITOT 0.3 0.4  PROT 7.6 7.5  ALBUMIN 3.9 5.0    Lab 04/28/12 1855 04/28/12 1551  LIPASE 56 47  AMYLASE -- --   No results found for this basename: AMMONIA:5 in the last 168 hours CBC:  Lab 05/01/12 0759 04/30/12 1945 04/30/12 0840 04/29/12 0455 04/28/12 1855  WBC 6.4 8.1 8.8 13.1* 19.5*  NEUTROABS -- -- -- -- 15.1*  HGB 11.7* 11.6* 11.5* 12.0 13.5  HCT 35.9* 35.6* 35.4* 36.8 40.3  MCV 88.4 89.0 88.3 88.2 86.5  PLT 265 275 279 266 389   Cardiac Enzymes: No results found for this basename: CKTOTAL:5,CKMB:5,CKMBINDEX:5,TROPONINI:5 in the last 168 hours BNP (last 3 results) No results found for this basename: PROBNP:3 in the last 8760 hours CBG:  Lab 05/01/12 0749 04/30/12 0740 04/29/12 0811  GLUCAP 102* 107* 99    No results found for this or any previous visit (from the past 240 hour(s)).   Studies: No results found.  Scheduled Meds:    . aliskiren  300 mg Oral Daily  . allopurinol  150  mg Oral Daily  . ciprofloxacin  400 mg Intravenous Q12H  . cyclobenzaprine  10 mg Oral Daily  . lamoTRIgine  50 mg Oral Daily  . lipase/protease/amylase  3 capsule Oral TID AC  . metoprolol succinate  100 mg Oral Daily  . metronidazole  500 mg Intravenous Q8H  . morphine  30 mg Oral Q8H  . pantoprazole  80 mg Oral BID AC  . saccharomyces boulardii  250 mg Oral BID  . sodium chloride  3 mL Intravenous Q12H   Continuous Infusions:    Brieana Shimmin A  [facility] Pager [phone]. If 8PM-8AM, please contact night-coverage at [email or web address], password [facility] 05/01/2012, 8:30 AM  LOS: 3 days

## 2012-05-01 NOTE — Progress Notes (Signed)
Patient accepted as transfer from colonoscopy.  Patient is awake alert and oriented.  Patient having discomfort in lower abdomen and will be medicated with pain medicine.  Patient currently on heart healthy diet, but would rather stay with full liquids for lunch and solids for dinner.  Patient's diet changed to a regular house diet per patient request.  Patient tolerated full liquids for lunch.  Will continue to monitor.

## 2012-05-01 NOTE — Progress Notes (Signed)
Patient OK to come off isolation as per infection prevention.

## 2012-05-02 ENCOUNTER — Encounter (HOSPITAL_COMMUNITY): Payer: Self-pay | Admitting: Gastroenterology

## 2012-05-02 DIAGNOSIS — D649 Anemia, unspecified: Secondary | ICD-10-CM

## 2012-05-02 LAB — BASIC METABOLIC PANEL
BUN: 8 mg/dL (ref 6–23)
Calcium: 9.1 mg/dL (ref 8.4–10.5)
Creatinine, Ser: 1.14 mg/dL — ABNORMAL HIGH (ref 0.50–1.10)
GFR calc Af Amer: 64 mL/min — ABNORMAL LOW (ref >90)
GFR calc non Af Amer: 55 mL/min — ABNORMAL LOW (ref >90)
Potassium: 3.9 mEq/L (ref 3.5–5.1)

## 2012-05-02 LAB — CBC
HCT: 36 % (ref 36.0–46.0)
MCH: 28.8 pg (ref 26.0–34.0)
MCHC: 32.8 g/dL (ref 30.0–36.0)
RDW: 13.5 % (ref 11.5–15.5)

## 2012-05-02 MED ORDER — ONDANSETRON HCL 4 MG/2ML IJ SOLN
4.0000 mg | Freq: Four times a day (QID) | INTRAMUSCULAR | Status: DC | PRN
  Administered 2012-05-02: 4 mg via INTRAVENOUS
  Filled 2012-05-02: qty 2

## 2012-05-02 NOTE — Progress Notes (Signed)
[facility] PROGRESS NOTE Assessment/Plan: Colitis Infectious versus ischemic, favor ischemic. Afebrile since 04/28/2012. On cipro/flagyl 04/28/2012, which was discontinued on 05/01/2012 by GI as patient's colitis was felt to be ischemic. CT abdomen and pelvis on 04/28/2012 showed descending and proximal sigmoid colitis.  GI on board Dr. Matthias Hughs and Dr. [name] Schlein evaluated the patient. Patient had flex sig on 05/01/2012 which showed probable ischemic colitis in the descending colon s/p biopsy. Patient will contact GI or GI will contact patient regarding biopsy results. Patient to followup with Dr. Madilyn Fireman, GI or PCP in 1-2 weeks.  Lower GI bleed/anemia  Secondary to colitis, hemoglobin trending down but has not required transfusion so far. Stable.  Leukocytosis Resolved, presumed to be from colitis.  Depression Stable.  Hypertension Stable.  Continue metoprolol.  GERD Continue PPI.  Obesity Diet and exercise as outpatient.  Code Status: full Family Communication: none  Disposition Plan: Home today.   Consultants:  Deboraha Sprang, GI.  Procedures:  Ct abdomen and pelvis on 04/28/2012: Descending and proximal sigmoid colitis  Antibiotics:  Cipro/Flagyl 04/28/2012-05/01/2012.  HPI/Subjective: Had some nausea this morning. Abdominal pain improved. Tolerating solid diet.   Objective: Filed Vitals:   05/01/12 1310 05/01/12 1412 05/01/12 2130 05/02/12 0500  BP: 109/36 123/83 102/64 107/65  Pulse: 73 78 80 76  Temp:  98 F (36.7 C) 98 F (36.7 C) 98 F (36.7 C)  TempSrc:  Oral Oral Oral  Resp: 11 14 16 18   Height:      Weight:      SpO2: 97% 98% 100% 100%   No intake or output data in the 24 hours ending 05/02/12 1031 Filed Weights   04/28/12 1724 04/29/12 0034  Weight: 135.626 kg (299 lb) 135.716 kg (299 lb 3.2 oz)    Exam: Physical Exam: General: Awake, Oriented, No acute distress. HEENT: EOMI. Neck: Supple CV: S1 and S2 Lungs: Clear to ascultation  bilaterally Abdomen: Soft, mild tenderness to palpation in the lower quadrant below umbilicus, Nondistended, +bowel sounds. Ext: Good pulses. Trace edema.  Data Reviewed: Basic Metabolic Panel:  Lab 05/02/12 1610 04/29/12 0455 04/28/12 1855 04/28/12 1551  NA 137 136 136 139  K 3.9 4.0 4.2 4.5  CL 101 102 98 100  CO2 27 24 26 27   GLUCOSE 125* 106* 101* 111*  BUN 8 11 17 16   CREATININE 1.14* 1.10 1.32* 1.37*  CALCIUM 9.1 8.9 9.4 10.2  MG -- -- -- --  PHOS -- -- -- --   Liver Function Tests:  Lab 04/28/12 1855 04/28/12 1551  AST 17 10  ALT 14 14  ALKPHOS 48 53  BILITOT 0.3 0.4  PROT 7.6 7.5  ALBUMIN 3.9 5.0    Lab 04/28/12 1855 04/28/12 1551  LIPASE 56 47  AMYLASE -- --   No results found for this basename: AMMONIA:5 in the last 168 hours CBC:  Lab 05/02/12 0517 05/01/12 0759 04/30/12 1945 04/30/12 0840 04/29/12 0455 04/28/12 1855  WBC 7.8 6.4 8.1 8.8 13.1* --  NEUTROABS -- -- -- -- -- 15.1*  HGB 11.8* 11.7* 11.6* 11.5* 12.0 --  HCT 36.0 35.9* 35.6* 35.4* 36.8 --  MCV 87.8 88.4 89.0 88.3 88.2 --  PLT 299 265 275 279 266 --   Cardiac Enzymes: No results found for this basename: CKTOTAL:5,CKMB:5,CKMBINDEX:5,TROPONINI:5 in the last 168 hours BNP (last 3 results) No results found for this basename: PROBNP:3 in the last 8760 hours CBG:  Lab 05/02/12 0718 05/01/12 0749 04/30/12 0740 04/29/12 0811  GLUCAP 124* 102* 107* 99  Recent Results (from the past 240 hour(s))  STOOL CULTURE     Status: Normal (Preliminary result)   Collection Time   04/29/12  4:53 PM      Component Value Range Status Comment   Specimen Description STOOL   Final    Special Requests NONE   Final    Culture NO SUSPICIOUS COLONIES, CONTINUING TO HOLD   Final    Report Status PENDING   Incomplete      Studies: No results found.  Scheduled Meds:    . aliskiren  300 mg Oral Daily  . allopurinol  150 mg Oral Daily  . cyclobenzaprine  10 mg Oral Daily  . lamoTRIgine  50 mg Oral Daily   . lipase/protease/amylase  3 capsule Oral TID AC  . metoprolol succinate  100 mg Oral Daily  . morphine  30 mg Oral Q8H  . pantoprazole  80 mg Oral BID AC  . saccharomyces boulardii  250 mg Oral BID  . sodium chloride  3 mL Intravenous Q12H  . [DISCONTINUED] ciprofloxacin  400 mg Intravenous Q12H  . [DISCONTINUED] metronidazole  500 mg Intravenous Q8H   Continuous Infusions:    Valerie [name] A  [facility] Pager [phone]. If 8PM-8AM, please contact night-coverage at [email or web address], password [facility] 05/02/2012, 10:31 AM  LOS: 4 days

## 2012-05-02 NOTE — Discharge Summary (Addendum)
Physician Discharge Summary  Valerie [name] WUJ:[id number] DOB: 17-Aug-1960 DOA: 04/28/2012  PCP: [name], MD  Admit date: 04/28/2012 Discharge date: 05/02/2012  Recommendations for Outpatient Follow-up:  Followup with Dr. Madilyn Fireman or [name], MD (PCP) in 1-2 weeks.  Dr. Florina Ou office will contact you with biopsy results and in 1 week if you have been called with the results please contact their office.  Discharge Diagnoses:  Principal Problem:  *Colitis Active Problems:  Hypertension  Obesity  Lower GI bleed  Leukocytosis  Anemia   Discharge Condition: Stable  Diet recommendation: Heart healthy diet.  Filed Weights   04/28/12 1724 04/29/12 0034  Weight: 135.626 kg (299 lb) 135.716 kg (299 lb 3.2 oz)    History of present illness:  Valerie [name] is a 51 y.o. female who presents with c/o bloody diarrhea on 04/28/2012.  Hospital Course:  Colitis Presumed to be due to ischemic. Afebrile since 04/28/2012. On cipro/flagyl 04/28/2012, which was discontinued on 05/01/2012 by GI as patient's colitis was felt to be ischemic. CT abdomen and pelvis on 04/28/2012 showed descending and proximal sigmoid colitis.  GI on board Dr. Matthias Hughs and Dr. [name] Schlein evaluated the patient. Patient had flex sig on 05/01/2012 which showed probable ischemic colitis in the descending colon s/p biopsy. Patient will contact GI or GI will contact patient regarding biopsy results. Patient to followup with Dr. Madilyn Fireman, GI or PCP in 1-2 weeks. Patient was tolerating solid diet prior to discharge.  Lower GI bleed/Acute blood loss anemia  Secondary to colitis, hemoglobin trending down but has not required transfusion so far. Stable. Acute blood loss anemia likely to due to colitis and lower GI bleed.  Leukocytosis Resolved, presumed to be from colitis.  Depression Stable.  Hypertension Stable.  Continue metoprolol.  GERD Continue PPI.  Obesity Diet and exercise as  outpatient.  Consultants:  Deboraha Sprang, GI.  Procedures:  Ct abdomen and pelvis on 04/28/2012: Descending and proximal sigmoid colitis  Antibiotics:  Cipro/Flagyl 04/28/2012-05/01/2012.  Discharge Exam: Filed Vitals:   05/01/12 1310 05/01/12 1412 05/01/12 2130 05/02/12 0500  BP: 109/36 123/83 102/64 107/65  Pulse: 73 78 80 76  Temp:  98 F (36.7 C) 98 F (36.7 C) 98 F (36.7 C)  TempSrc:  Oral Oral Oral  Resp: 11 14 16 18   Height:      Weight:      SpO2: 97% 98% 100% 100%   Discharge Instructions  Discharge Orders    Future Orders Please Complete By Expires   Diet - low sodium heart healthy      Increase activity slowly      Discharge instructions      Comments:   Followup with Dr. Madilyn Fireman or [name], MD (PCP) in 1-2 weeks.  Dr. Florina Ou office will contact you with biopsy results and in 1 week if you have been called with the results please contact their office.       Medication List     As of 05/02/2012 11:06 AM    TAKE these medications         albuterol 108 (90 BASE) MCG/ACT inhaler   Commonly known as: PROVENTIL HFA;VENTOLIN HFA   Inhale 2 puffs into the lungs as needed.      aliskiren 300 MG tablet   Commonly known as: TEKTURNA   Take 1 tablet (300 mg total) by mouth daily.      allopurinol 300 MG tablet   Commonly known as: ZYLOPRIM   1/2 tab every day      ALPRAZolam 1  MG tablet   Commonly known as: XANAX   Take 1 tablet (1 mg total) by mouth as needed.      BLACK COHOSH PO   Take 1 tablet by mouth as needed.      cetirizine 10 MG tablet   Commonly known as: ZYRTEC   Take 10 mg by mouth daily.      cyclobenzaprine 10 MG tablet   Commonly known as: FLEXERIL   Take 10 mg by mouth daily.      furosemide 20 MG tablet   Commonly known as: LASIX   Take 20 mg by mouth every other day.      HYDROcodone-acetaminophen 10-500 MG per tablet   Commonly known as: LORTAB   Take 1 tablet by mouth as needed.      ibuprofen 600 MG tablet    Commonly known as: ADVIL,MOTRIN   Take 600 mg by mouth as needed.      lamoTRIgine 100 MG tablet   Commonly known as: LAMICTAL   Take 0.5 tablets (50 mg total) by mouth daily.      lipase/protease/amylase 16109 UNITS Cpep   Commonly known as: CREON-10/PANCREASE   Take 3 capsules by mouth 3 (three) times daily before meals.      metoprolol succinate 100 MG 24 hr tablet   Commonly known as: TOPROL-XL   Take 1 tablet (100 mg total) by mouth daily. Take with or immediately following a meal.      morphine 30 MG 24 hr capsule   Commonly known as: KADIAN   Take 30 mg by mouth 3 (three) times daily.      multivitamin tablet   Take 1 tablet by mouth daily.      omeprazole 40 MG capsule   Commonly known as: PRILOSEC   Take 40 mg by mouth 2 (two) times daily.      spironolactone 100 MG tablet   Commonly known as: ALDACTONE   Take 1 tablet (100 mg total) by mouth daily.           Follow-up Information    Follow up with [name], MD. Schedule an appointment as soon as possible for a visit in 1 week.   Contact information:   [address] [place] [address] [phone]       Follow up with HAYES,JOHN C, MD. Schedule an appointment as soon as possible for a visit in 1 week. (With Dr. Madilyn Fireman or Dr. Kenyon Ana in 1-2 weeks. Please contact office or office will contact you with biopsy results.)    Contact information:   [address]                         [place] [address] [phone]           The results of significant diagnostics from this hospitalization (including imaging, microbiology, ancillary and laboratory) are listed below for reference.    Significant Diagnostic Studies: Mr Lumbar Spine Wo Contrast  04/19/2012  *RADIOLOGY REPORT*  Clinical Data: Low back pain and bilateral leg pain.  MRI LUMBAR SPINE WITHOUT CONTRAST  Technique:  Multiplanar and multiecho pulse sequences of the lumbar spine were obtained without intravenous  contrast.  Comparison: CT scan of the abdomen and pelvis dated 11/21/2011  Findings: Tip of the conus is at L1-2 and appears normal.  Normal paraspinal soft tissues.  T11-12 through L1-2:  Normal.  L2-3:  Focal bulge of the disc into the  left neural foramen and slightly lateral to it with no impingement upon the exiting left L2 nerve.  No impingement upon the thecal sac.  Otherwise, normal.  L3-4 through L5-S1:  Normal.  There is no spinal or foraminal stenosis.  No significant facet joint disease.  IMPRESSION: Small far lateral bulge of the disc at L2-3 into the left neural foramen and lateral to it without neural impingement.  Otherwise, essentially normal MRI of the lumbar spine.   Original Report Authenticated By: Francene Boyers, M.D.    Ct Abdomen Pelvis W Contrast  04/28/2012  *RADIOLOGY REPORT*  Clinical Data: Abdominal pain.  Rectal bleeding.  CT ABDOMEN AND PELVIS WITH CONTRAST  Technique:  Multidetector CT imaging of the abdomen and pelvis was performed following the standard protocol during bolus administration of intravenous contrast.  Contrast: OMNIPAQUE IOHEXOL 300 MG/ML  SOLN  Comparison: 11/21/2011.  Findings: Lung Bases: Normal.  Liver:  Normal.  Spleen:  Normal.  Gallbladder:  Cholecystectomy.  Common bile duct:  Normal.  Pancreas:  Normal.  Adrenal glands:  Normal.  Kidneys:  Normal enhancement and excretion of contrast.  Stomach:  Normal.  Small bowel:  Normal.  No mesenteric adenopathy.  No inflammatory changes.  Colon:   Normal appendix.  The ascending colon is normal. Transverse colon is also normal. There is colitis of the descending colon and sigmoid colon.  The rectum appears within normal limits.  Pelvic Genitourinary:  Normal appearance of the urinary bladder, uterus and adnexa.  No free fluid.  No pelvic adenopathy.  Bones:  No aggressive osseous lesions.  Bilateral sacroiliac joint degenerative disease.  Vasculature: Normal.  IMPRESSION: 1.  Descending and proximal sigmoid  colitis.  Differential considerations include infection and inflammatory bowel disease. Ischemia considered unlikely. 2.  Cholecystectomy.   Original Report Authenticated By: Andreas Newport, M.D.    Dg Abd 2 Views  04/28/2012  *RADIOLOGY REPORT*  Clinical Data: Abdominal cramping.  Pain.  Bloody stool.  ABDOMEN - 2 VIEW  Comparison: 09/22/2010 CT at [facility].  Findings: Normal bowel gas pattern. Cholecystectomy clips are present in the right upper quadrant.  No gross plain film evidence of free air.  Relative paucity of distal bowel gas.  There may be a tiny amount of air in the rectum.  IMPRESSION: No acute abnormality.   Original Report Authenticated By: Andreas Newport, M.D.     Microbiology: Recent Results (from the past 240 hour(s))  STOOL CULTURE     Status: Normal (Preliminary result)   Collection Time   04/29/12  4:53 PM      Component Value Range Status Comment   Specimen Description STOOL   Final    Special Requests NONE   Final    Culture NO SUSPICIOUS COLONIES, CONTINUING TO HOLD   Final    Report Status PENDING   Incomplete      Labs: Basic Metabolic Panel:  Lab 05/02/12 1610 04/29/12 0455 04/28/12 1855 04/28/12 1551  NA 137 136 136 139  K 3.9 4.0 4.2 4.5  CL 101 102 98 100  CO2 27 24 26 27   GLUCOSE 125* 106* 101* 111*  BUN 8 11 17 16   CREATININE 1.14* 1.10 1.32* 1.37*  CALCIUM 9.1 8.9 9.4 10.2  MG -- -- -- --  PHOS -- -- -- --   Liver Function Tests:  Lab 04/28/12 1855 04/28/12 1551  AST 17 10  ALT 14 14  ALKPHOS 48 53  BILITOT 0.3 0.4  PROT 7.6 7.5  ALBUMIN 3.9  5.0    Lab 04/28/12 1855 04/28/12 1551  LIPASE 56 47  AMYLASE -- --   No results found for this basename: AMMONIA:5 in the last 168 hours CBC:  Lab 05/02/12 0517 05/01/12 0759 04/30/12 1945 04/30/12 0840 04/29/12 0455 04/28/12 1855  WBC 7.8 6.4 8.1 8.8 13.1* --  NEUTROABS -- -- -- -- -- 15.1*  HGB 11.8* 11.7* 11.6* 11.5* 12.0 --  HCT 36.0 35.9* 35.6* 35.4* 36.8 --  MCV 87.8 88.4  89.0 88.3 88.2 --  PLT 299 265 275 279 266 --   Cardiac Enzymes: No results found for this basename: CKTOTAL:5,CKMB:5,CKMBINDEX:5,TROPONINI:5 in the last 168 hours BNP: BNP (last 3 results) No results found for this basename: PROBNP:3 in the last 8760 hours CBG:  Lab 05/02/12 0718 05/01/12 0749 04/30/12 0740 04/29/12 0811  GLUCAP 124* 102* 107* 99   Time spent: 25 minutes.  Signed:  Jaymar Loeber A  [facility] 05/02/2012, 11:06 AM

## 2012-05-02 NOTE — Progress Notes (Signed)
Doing ok.  Some loose stools after solid dinner last night. Some nausea this morning after breakfast this morning (reason not clear--off Flagyl since yesterday).  No vomiting.  Essentially free of pain.  Flex sig yesterday showed changes c/w ischemic colitis of descending colon, as expected.  Biospy results are pending at present.  Exam:  Alert, NAD.  Abdomen very obese, but nontender. Afebrile.  Labs:  WBC remains nl, Hgb stable.  Bx's pending  Impr: resolving ischemic colitis.  Recomm:  Consider discharge.  Outpatient followup with [place] or PCP in a week or two to make sure everything is back to baseline is reasonable. Have advised pt to call our office for bx results about a week from now.  Florencia Reasons, M.D. [phone]

## 2012-05-03 ENCOUNTER — Other Ambulatory Visit: Payer: Self-pay | Admitting: Radiology

## 2012-05-03 LAB — STOOL CULTURE

## 2012-05-03 MED ORDER — METRONIDAZOLE 500 MG PO TABS: 500.0000 mg | ORAL_TABLET | Freq: Two times a day (BID) | ORAL | Status: DC

## 2012-05-06 ENCOUNTER — Ambulatory Visit (INDEPENDENT_AMBULATORY_CARE_PROVIDER_SITE_OTHER): Payer: Medicare Other | Admitting: Family Medicine

## 2012-05-06 ENCOUNTER — Encounter: Payer: Self-pay | Admitting: Family Medicine

## 2012-05-06 VITALS — BP 135/86 | HR 67 | Temp 98.5°F | Resp 16 | Ht 65.0 in | Wt 298.8 lb

## 2012-05-06 DIAGNOSIS — K529 Noninfective gastroenteritis and colitis, unspecified: Secondary | ICD-10-CM

## 2012-05-06 DIAGNOSIS — R52 Pain, unspecified: Secondary | ICD-10-CM | POA: Diagnosis not present

## 2012-05-06 DIAGNOSIS — K5289 Other specified noninfective gastroenteritis and colitis: Secondary | ICD-10-CM

## 2012-05-06 DIAGNOSIS — F4323 Adjustment disorder with mixed anxiety and depressed mood: Secondary | ICD-10-CM | POA: Diagnosis not present

## 2012-05-06 DIAGNOSIS — R1011 Right upper quadrant pain: Secondary | ICD-10-CM

## 2012-05-06 LAB — AMYLASE: Amylase: 48 U/L (ref 0–105)

## 2012-05-06 LAB — POCT UA - MICROSCOPIC ONLY
Bacteria, U Microscopic: NEGATIVE
Casts, Ur, LPF, POC: NEGATIVE
Crystals, Ur, HPF, POC: NEGATIVE
Mucus, UA: NEGATIVE
WBC, Ur, HPF, POC: NEGATIVE
Yeast, UA: NEGATIVE

## 2012-05-06 LAB — COMPREHENSIVE METABOLIC PANEL
ALT: 13 U/L (ref 0–35)
AST: 17 U/L (ref 0–37)
Albumin: 4.7 g/dL (ref 3.5–5.2)
Alkaline Phosphatase: 43 U/L (ref 39–117)
BUN: 7 mg/dL (ref 6–23)
CO2: 28 mEq/L (ref 19–32)
Calcium: 10 mg/dL (ref 8.4–10.5)
Chloride: 103 mEq/L (ref 96–112)
Creat: 1.06 mg/dL (ref 0.50–1.10)
Glucose, Bld: 90 mg/dL (ref 70–99)
Potassium: 4.6 mEq/L (ref 3.5–5.3)
Sodium: 140 mEq/L (ref 135–145)
Total Bilirubin: 0.4 mg/dL (ref 0.3–1.2)
Total Protein: 7.1 g/dL (ref 6.0–8.3)

## 2012-05-06 LAB — POCT CBC
Granulocyte percent: 58.8 %G (ref 37–80)
HCT, POC: 45.7 % (ref 37.7–47.9)
Hemoglobin: 14 g/dL (ref 12.2–16.2)
Lymph, poc: 2.9 (ref 0.6–3.4)
MCH, POC: 28.2 pg (ref 27–31.2)
MCHC: 30.6 g/dL — AB (ref 31.8–35.4)
MCV: 91.9 fL (ref 80–97)
MID (cbc): 0.5 (ref 0–0.9)
MPV: 9.2 fL (ref 0–99.8)
POC Granulocyte: 4.9 (ref 2–6.9)
POC LYMPH PERCENT: 35 %L (ref 10–50)
POC MID %: 6.2 %M (ref 0–12)
Platelet Count, POC: 428 10*3/uL — AB (ref 142–424)
RBC: 4.97 M/uL (ref 4.04–5.48)
RDW, POC: 14.5 %
WBC: 8.3 10*3/uL (ref 4.6–10.2)

## 2012-05-06 LAB — POCT URINALYSIS DIPSTICK
Bilirubin, UA: NEGATIVE
Blood, UA: NEGATIVE
Glucose, UA: NEGATIVE
Ketones, UA: NEGATIVE
Leukocytes, UA: NEGATIVE
Nitrite, UA: NEGATIVE
Protein, UA: NEGATIVE
Spec Grav, UA: 1.015
Urobilinogen, UA: 0.2
pH, UA: 7.5

## 2012-05-06 LAB — POCT SEDIMENTATION RATE: POCT SED RATE: 43 mm/hr — AB (ref 0–22)

## 2012-05-06 LAB — LIPASE: Lipase: 55 U/L (ref 0–75)

## 2012-05-06 NOTE — Patient Instructions (Signed)
Stay on the probiotics. If you have any worsening, come back and see me I expect this will take another week to improve.

## 2012-05-06 NOTE — Progress Notes (Signed)
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  Once discharged last week, she restarted the diarrhea.  She discussed the problem with Dr. Matthias Hughs and he advised against antibiotics.  She also had a UTI.  She was treated with cipro and flagyl until discharge.  Now, the diarrhea has stopped.  She hasn't eaten much in three days.  She is feeling a little weak and having some right upper quadrant discomfort and nausea.  Objective:  NAD overweight middle aged woman, very kind hearted and appropriate HEENT:  Unremarkable Skin:  No rashes Chest:  Clear Heart: reg, no murmur Abdomen:  Occasional borborygmi, mild right sided tenderness with deep palpation.  No rebound or guarding.  Too obese to assess for HSM Extremities:  Normal gait Neuro:  Alert, appropriate; no focal weakness  Results for orders placed in visit on 05/06/12  POCT CBC      Component Value Range   WBC 8.3  4.6 - 10.2 K/uL   Lymph, poc 2.9  0.6 - 3.4   POC LYMPH PERCENT 35.0  10 - 50 %L   MID (cbc) 0.5  0 - 0.9   POC MID % 6.2  0 - 12 %M   POC Granulocyte 4.9  2 - 6.9   Granulocyte percent 58.8  37 - 80 %G   RBC 4.97  4.04 - 5.48 M/uL   Hemoglobin 14.0  12.2 - 16.2 g/dL   HCT, POC 96.0  45.4 - 47.9 %   MCV 91.9  80 - 97 fL   MCH, POC 28.2  27 - 31.2 pg   MCHC 30.6 (*) 31.8 - 35.4 g/dL   RDW, POC 09.8     Platelet Count, POC 428 (*) 142 - 424 K/uL   MPV 9.2  0 - 99.8 fL  POCT URINALYSIS DIPSTICK      Component Value Range   Color, UA yellow     Clarity, UA clear     Glucose, UA neg     Bilirubin, UA neg     Ketones, UA neg     Spec Grav, UA 1.015     Blood, UA neg     pH, UA 7.5     Protein, UA neg     Urobilinogen, UA 0.2     Nitrite, UA neg     Leukocytes, UA Negative    POCT UA - MICROSCOPIC ONLY      Component Value Range   WBC, Ur, HPF, POC neg     RBC, urine, microscopic 0-1     Bacteria, U Microscopic neg     Mucus, UA neg     Epithelial cells, urine per micros 8-10     Crystals, Ur, HPF, POC neg     Casts, Ur, LPF, POC neg     Yeast, UA neg       Assessment:  This illness really goes unexplained. It's hard to understand why she would have ischemic colitis on the basis of her medications or underlying health issues. The good news is she seem to be getting better with watchful waiting. We still have some outstanding labs pending which may shed some more light on this acute illness. 1. Abdominal pain, acute, right upper quadrant  POCT CBC, POCT urinalysis dipstick, POCT UA - Microscopic Only, Amylase, Comprehensive metabolic panel, Lipase  2. Colitis, acute  POCT SEDIMENTATION RATE, Lipase

## 2012-05-14 DIAGNOSIS — F4323 Adjustment disorder with mixed anxiety and depressed mood: Secondary | ICD-10-CM | POA: Diagnosis not present

## 2012-05-15 DIAGNOSIS — K3184 Gastroparesis: Secondary | ICD-10-CM | POA: Diagnosis not present

## 2012-05-15 DIAGNOSIS — K5289 Other specified noninfective gastroenteritis and colitis: Secondary | ICD-10-CM | POA: Diagnosis not present

## 2012-05-20 DIAGNOSIS — F4323 Adjustment disorder with mixed anxiety and depressed mood: Secondary | ICD-10-CM | POA: Diagnosis not present

## 2012-05-27 DIAGNOSIS — F332 Major depressive disorder, recurrent severe without psychotic features: Secondary | ICD-10-CM | POA: Diagnosis not present

## 2012-05-27 DIAGNOSIS — M545 Low back pain, unspecified: Secondary | ICD-10-CM | POA: Diagnosis not present

## 2012-05-27 DIAGNOSIS — F329 Major depressive disorder, single episode, unspecified: Secondary | ICD-10-CM | POA: Diagnosis not present

## 2012-05-27 DIAGNOSIS — Z9889 Other specified postprocedural states: Secondary | ICD-10-CM | POA: Diagnosis not present

## 2012-05-27 DIAGNOSIS — E669 Obesity, unspecified: Secondary | ICD-10-CM | POA: Diagnosis not present

## 2012-05-27 DIAGNOSIS — R1013 Epigastric pain: Secondary | ICD-10-CM | POA: Diagnosis not present

## 2012-05-27 DIAGNOSIS — M79609 Pain in unspecified limb: Secondary | ICD-10-CM | POA: Diagnosis not present

## 2012-05-27 DIAGNOSIS — K219 Gastro-esophageal reflux disease without esophagitis: Secondary | ICD-10-CM | POA: Diagnosis not present

## 2012-05-27 DIAGNOSIS — F3289 Other specified depressive episodes: Secondary | ICD-10-CM | POA: Diagnosis not present

## 2012-05-27 DIAGNOSIS — K3189 Other diseases of stomach and duodenum: Secondary | ICD-10-CM | POA: Diagnosis not present

## 2012-05-27 DIAGNOSIS — J309 Allergic rhinitis, unspecified: Secondary | ICD-10-CM | POA: Diagnosis not present

## 2012-05-27 DIAGNOSIS — K859 Acute pancreatitis without necrosis or infection, unspecified: Secondary | ICD-10-CM | POA: Diagnosis not present

## 2012-05-27 DIAGNOSIS — F4310 Post-traumatic stress disorder, unspecified: Secondary | ICD-10-CM | POA: Diagnosis not present

## 2012-05-27 DIAGNOSIS — Z79899 Other long term (current) drug therapy: Secondary | ICD-10-CM | POA: Diagnosis not present

## 2012-05-29 DIAGNOSIS — F4323 Adjustment disorder with mixed anxiety and depressed mood: Secondary | ICD-10-CM | POA: Diagnosis not present

## 2012-06-10 DIAGNOSIS — F4323 Adjustment disorder with mixed anxiety and depressed mood: Secondary | ICD-10-CM | POA: Diagnosis not present

## 2012-06-22 ENCOUNTER — Ambulatory Visit (INDEPENDENT_AMBULATORY_CARE_PROVIDER_SITE_OTHER): Payer: Medicare Other | Admitting: Family Medicine

## 2012-06-22 VITALS — BP 116/79 | HR 114 | Temp 99.0°F | Resp 17 | Ht 65.6 in | Wt 292.0 lb

## 2012-06-22 DIAGNOSIS — K859 Acute pancreatitis without necrosis or infection, unspecified: Secondary | ICD-10-CM | POA: Insufficient documentation

## 2012-06-22 DIAGNOSIS — F32A Depression, unspecified: Secondary | ICD-10-CM | POA: Insufficient documentation

## 2012-06-22 DIAGNOSIS — F329 Major depressive disorder, single episode, unspecified: Secondary | ICD-10-CM

## 2012-06-22 DIAGNOSIS — IMO0001 Reserved for inherently not codable concepts without codable children: Secondary | ICD-10-CM | POA: Diagnosis not present

## 2012-06-22 DIAGNOSIS — F3289 Other specified depressive episodes: Secondary | ICD-10-CM

## 2012-06-22 DIAGNOSIS — I10 Essential (primary) hypertension: Secondary | ICD-10-CM

## 2012-06-22 DIAGNOSIS — M797 Fibromyalgia: Secondary | ICD-10-CM | POA: Insufficient documentation

## 2012-06-22 DIAGNOSIS — K861 Other chronic pancreatitis: Secondary | ICD-10-CM | POA: Diagnosis not present

## 2012-06-22 DIAGNOSIS — IMO0002 Reserved for concepts with insufficient information to code with codable children: Secondary | ICD-10-CM | POA: Insufficient documentation

## 2012-06-22 DIAGNOSIS — M549 Dorsalgia, unspecified: Secondary | ICD-10-CM

## 2012-06-22 LAB — COMPREHENSIVE METABOLIC PANEL
ALT: 15 U/L (ref 0–35)
AST: 14 U/L (ref 0–37)
Albumin: 4.8 g/dL (ref 3.5–5.2)
Alkaline Phosphatase: 56 U/L (ref 39–117)
BUN: 20 mg/dL (ref 6–23)
CO2: 25 mEq/L (ref 19–32)
Calcium: 10 mg/dL (ref 8.4–10.5)
Chloride: 100 mEq/L (ref 96–112)
Creat: 1.11 mg/dL — ABNORMAL HIGH (ref 0.50–1.10)
Glucose, Bld: 120 mg/dL — ABNORMAL HIGH (ref 70–99)
Potassium: 4.6 mEq/L (ref 3.5–5.3)
Sodium: 137 mEq/L (ref 135–145)
Total Bilirubin: 0.5 mg/dL (ref 0.3–1.2)
Total Protein: 7.7 g/dL (ref 6.0–8.3)

## 2012-06-22 LAB — POCT CBC
Granulocyte percent: 64.4 %G (ref 37–80)
HCT, POC: 47 % (ref 37.7–47.9)
Hemoglobin: 14.9 g/dL (ref 12.2–16.2)
Lymph, poc: 2.9 (ref 0.6–3.4)
MCH, POC: 28.6 pg (ref 27–31.2)
MCHC: 31.7 g/dL — AB (ref 31.8–35.4)
MCV: 90.2 fL (ref 80–97)
MID (cbc): 0.6 (ref 0–0.9)
MPV: 10.1 fL (ref 0–99.8)
POC Granulocyte: 6.4 (ref 2–6.9)
POC LYMPH PERCENT: 29.2 %L (ref 10–50)
POC MID %: 6.4 %M (ref 0–12)
Platelet Count, POC: 394 10*3/uL (ref 142–424)
RBC: 5.21 M/uL (ref 4.04–5.48)
RDW, POC: 14.8 %
WBC: 10 10*3/uL (ref 4.6–10.2)

## 2012-06-22 LAB — POCT GLYCOSYLATED HEMOGLOBIN (HGB A1C): Hemoglobin A1C: 6.4

## 2012-06-22 MED ORDER — BENAZEPRIL HCL 40 MG PO TABS: 40.0000 mg | ORAL_TABLET | Freq: Every day | ORAL | Status: DC

## 2012-06-22 NOTE — Progress Notes (Signed)
52 yo woman with recent bout of colitis requiring hospitalization.  She has lost a significant amount of weight and has been more irritable in last few weeks.  No further colitis symptoms.  Still working on graduate studies because of academic limbo.  She has retained a lawyer to help her get reinstated.  Patient has been disabled since 2000.  She had hoped to get a degree in nanoscience, but has not been able to pursue this.  Her educational loans are coming due which will be difficult to pay on disability income.  Objective:  NAD Abdomen:  Soft, mildly tender RLQ, no mass or HSM Heart:  Reg, no murmur Chest:  Clear Neck: no thyromegaly, supple Spent 45 minutes face to face.  Assessment:  Possible hypoglycemia, stable fibromyalgia.  I really wish I could straighten out the educational piece for Valerie [name], as she is quite bright and very kind.  Plan:  Lenox Ahr out forms today.   Change Tekturna to benazepril 1. Hypertension  benazepril (LOTENSIN) 40 MG tablet, Comprehensive metabolic panel, POCT CBC  2. Fibromyalgia    3. Back pain    4. Recurrent pancreatitis  POCT glycosylated hemoglobin (Hb A1C), Comprehensive metabolic panel, POCT CBC  5. Depression

## 2012-06-25 DIAGNOSIS — F4323 Adjustment disorder with mixed anxiety and depressed mood: Secondary | ICD-10-CM | POA: Diagnosis not present

## 2012-06-30 ENCOUNTER — Ambulatory Visit (INDEPENDENT_AMBULATORY_CARE_PROVIDER_SITE_OTHER): Payer: Medicare Other | Admitting: Family Medicine

## 2012-06-30 VITALS — BP 109/71 | HR 114 | Temp 100.2°F | Resp 16 | Ht 63.5 in | Wt 292.0 lb

## 2012-06-30 DIAGNOSIS — J40 Bronchitis, not specified as acute or chronic: Secondary | ICD-10-CM | POA: Diagnosis not present

## 2012-06-30 MED ORDER — HYDROCODONE-HOMATROPINE 5-1.5 MG/5ML PO SYRP: 5.0000 mL | ORAL_SOLUTION | Freq: Three times a day (TID) | ORAL | Status: DC | PRN

## 2012-06-30 MED ORDER — AZITHROMYCIN 250 MG PO TABS: ORAL_TABLET | ORAL | Status: DC

## 2012-06-30 NOTE — Progress Notes (Signed)
Patient ID: Valerie [name] MRN: [id number], DOB: 10/30/1960, 52 y.o. Date of Encounter: 06/30/2012, 8:36 AM  Primary Physician: [name], MD  Chief Complaint:  Chief Complaint  Patient presents with  . URI    Started Thurs  . Fever    Needs RF of albuterol    HPI: 52 y.o. year old female presents with a 3 day history of nasal congestion, post nasal drip, sore throat, and cough. Mild sinus pressure. Afebrile. No chills. Nasal congestion thick and green/yellow. Cough is productive of green/yellow sputum and not associated with time of day. Ears feel full, leading to sensation of muffled hearing. Has tried OTC cold preps without success. No GI complaints.   No sick contacts, recent antibiotics, or recent travels.   No leg trauma, sedentary periods, h/o cancer, or tobacco use.  Past Medical History  Diagnosis Date  . Hypertension   . Anxiety   . Mental disorder   . Depression   . Sleep apnea   . GERD (gastroesophageal reflux disease)   . Headache   . Neuromuscular disorder   . 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 Fibromyalgia   . Arthritis   . Anemia 05/01/2012     Home Meds: Prior to Admission medications   Medication Sig Start Date End Date Taking? Authorizing Provider  albuterol (PROVENTIL HFA;VENTOLIN HFA) 108 (90 BASE) MCG/ACT inhaler Inhale 2 puffs into the lungs as needed.   Yes Historical Provider, MD  allopurinol (ZYLOPRIM) 300 MG tablet 1/2 tab every day 08/23/11  Yes [name], MD  ALPRAZolam Prudy Feeler) 1 MG tablet Take 1 tablet (1 mg total) by mouth as needed. 08/01/11  Yes [name], MD  benazepril (LOTENSIN) 40 MG tablet Take 1 tablet (40 mg total) by mouth daily. 06/22/12  Yes [name], MD  BLACK COHOSH PO Take 1 tablet by mouth as needed.   Yes Historical Provider, MD  cetirizine (ZYRTEC) 10 MG tablet Take 10 mg by mouth daily.   Yes Historical Provider, MD  cyclobenzaprine (FLEXERIL) 10 MG tablet Take 10 mg by mouth daily.    Yes Historical Provider, MD  furosemide (LASIX) 20 MG tablet Take 20 mg by mouth every other day.   Yes Historical Provider, MD  HYDROcodone-acetaminophen (LORTAB) 10-500 MG per tablet Take 1 tablet by mouth as needed.   Yes Historical Provider, MD  ibuprofen (ADVIL,MOTRIN) 600 MG tablet Take 600 mg by mouth as needed.   Yes Historical Provider, MD  lamoTRIgine (LAMICTAL) 100 MG tablet Take 0.5 tablets (50 mg total) by mouth daily. 08/01/11  Yes [name], MD  lipase/protease/amylase (CREON-10/PANCREASE) 12000 UNITS CPEP Take 3 capsules by mouth 3 (three) times daily before meals.   Yes Historical Provider, MD  morphine (KADIAN) 30 MG 24 hr capsule Take 30 mg by mouth 3 (three) times daily.   Yes Historical Provider, MD  Multiple Vitamin (MULTIVITAMIN) tablet Take 1 tablet by mouth daily.   Yes Historical Provider, MD  omeprazole (PRILOSEC) 40 MG capsule Take 40 mg by mouth 2 (two) times daily.   Yes Historical Provider, MD  spironolactone (ALDACTONE) 100 MG tablet Take 1 tablet (100 mg total) by mouth daily. 08/01/11 07/31/12 Yes [name], MD  azithromycin (ZITHROMAX Z-PAK) 250 MG tablet Take as directed on pack 06/30/12   [name], MD  HYDROcodone-homatropine [facility] 5-1.5 MG/5ML syrup Take 5 mLs by mouth every 8 (eight) hours as needed for cough. 06/30/12   [name], MD  metoprolol succinate (  TOPROL-XL) 100 MG 24 hr tablet Take 1 tablet (100 mg total) by mouth daily. Take with or immediately following a meal. 08/01/11 07/31/12  [name], MD    Allergies:  Allergies  Allergen Reactions  . Dilaudid (Hydromorphone Hcl) Other (See Comments)    Headache   . Pregabalin   . Metoclopramide Hcl Other (See Comments)    "crawling inside"  . Neurontin (Gabapentin) Other (See Comments)    Tremors   . Other Other (See Comments)    Avoid all SSRI's and SNRI's- damaged pancreas    History   Social History  . Marital Status: Single    Spouse Name: N/A    Number of  Children: N/A  . Years of Education: N/A   Occupational History  . Not on file.   Social History Main Topics  . Smoking status: Never Smoker   . Smokeless tobacco: Not on file  . Alcohol Use: No  . Drug Use: No  . Sexually Active: Yes   Other Topics Concern  . Not on file   Social History Narrative  . No narrative on file     Review of Systems: Constitutional: negative for chills, fever, night sweats or weight changes Cardiovascular: negative for chest pain or palpitations Respiratory: negative for hemoptysis, wheezing, or shortness of breath Abdominal: negative for abdominal pain, nausea, vomiting or diarrhea Dermatological: negative for rash Neurologic: negative for headache   Physical Exam: Blood pressure 109/71, pulse 114, temperature 100.2 F (37.9 C), resp. rate 16, height 5' 3.5" (1.613 m), weight 292 lb (132.45 kg)., Body mass index is 50.91 kg/(m^2). General: Well developed, well nourished, in no acute distress. Head: Normocephalic, atraumatic, eyes without discharge, sclera non-icteric, nares are congested. Bilateral auditory canals clear, TM's are without perforation, pearly grey with reflective cone of light bilaterally. No sinus TTP. Oral cavity moist, dentition normal. Posterior pharynx with post nasal drip and mild erythema. No peritonsillar abscess or tonsillar exudate. Neck: Supple. No thyromegaly. Full ROM. No lymphadenopathy. Lungs: Coarse breath sounds bilaterally without wheezes, rales, or rhonchi. Breathing is unlabored.  Heart: RRR with S1 S2. No murmurs, rubs, or gallops appreciated. Msk:  Strength and tone normal for age. Extremities: No clubbing or cyanosis. No edema. Neuro: Alert and oriented X 3. Moves all extremities spontaneously. CNII-XII grossly in tact. Psych:  Responds to questions appropriately with a normal affect.    ASSESSMENT AND PLAN:  52 y.o. year old female with bronchitis. 1. Bronchitis  azithromycin (ZITHROMAX Z-PAK) 250 MG  tablet, HYDROcodone-homatropine (HYCODAN) 5-1.5 MG/5ML syrup    - -Mucinex -Tylenol/Motrin prn -Rest/fluids -RTC precautions -RTC 3-5 days if no improvement  Signed, [name], MD 06/30/2012 8:36 AM

## 2012-06-30 NOTE — Patient Instructions (Addendum)

## 2012-07-02 ENCOUNTER — Ambulatory Visit (INDEPENDENT_AMBULATORY_CARE_PROVIDER_SITE_OTHER): Payer: Medicare Other | Admitting: Family Medicine

## 2012-07-02 ENCOUNTER — Ambulatory Visit: Payer: Medicare Other

## 2012-07-02 ENCOUNTER — Telehealth: Payer: Self-pay

## 2012-07-02 VITALS — BP 102/69 | HR 122 | Temp 100.6°F | Resp 17 | Ht 65.0 in | Wt 293.0 lb

## 2012-07-02 DIAGNOSIS — R059 Cough, unspecified: Secondary | ICD-10-CM

## 2012-07-02 DIAGNOSIS — R05 Cough: Secondary | ICD-10-CM

## 2012-07-02 DIAGNOSIS — R509 Fever, unspecified: Secondary | ICD-10-CM

## 2012-07-02 LAB — POCT CBC
Granulocyte percent: 69.5 %G (ref 37–80)
HCT, POC: 41.1 % (ref 37.7–47.9)
Hemoglobin: 12.4 g/dL (ref 12.2–16.2)
Lymph, poc: 2.5 (ref 0.6–3.4)
MCH, POC: 27.5 pg (ref 27–31.2)
MCHC: 30.2 g/dL — AB (ref 31.8–35.4)
MCV: 91.2 fL (ref 80–97)
MID (cbc): 0.6 (ref 0–0.9)
MPV: 9.5 fL (ref 0–99.8)
POC Granulocyte: 7 — AB (ref 2–6.9)
POC LYMPH PERCENT: 24.5 %L (ref 10–50)
POC MID %: 6 %M (ref 0–12)
Platelet Count, POC: 326 10*3/uL (ref 142–424)
RBC: 4.51 M/uL (ref 4.04–5.48)
RDW, POC: 15.4 %
WBC: 10.1 10*3/uL (ref 4.6–10.2)

## 2012-07-02 LAB — POCT INFLUENZA A/B
Influenza A, POC: NEGATIVE
Influenza B, POC: NEGATIVE

## 2012-07-02 MED ORDER — ALBUTEROL SULFATE HFA 108 (90 BASE) MCG/ACT IN AERS: 2.0000 | INHALATION_SPRAY | RESPIRATORY_TRACT | Status: DC | PRN

## 2012-07-02 MED ORDER — METHYLPREDNISOLONE 4 MG PO KIT: PACK | ORAL | Status: DC

## 2012-07-02 NOTE — Progress Notes (Signed)
Patient ID: Faron Whitelock MRN: [id number], DOB: Feb 02, 1961, 52 y.o. Date of Encounter: 07/02/2012, 12:43 PM  Primary Physician: [name], MD  Chief Complaint:  Chief Complaint  Patient presents with  . Cough    mucus   . Nasal Congestion  . Sinusitis  . swelling throat per patient  . Fever    HPI: 52 y.o. year old female presents with a 5 day history of nasal congestion, post nasal drip, sore throat, and cough. Mild sinus pressure.  Nasal congestion thick and green/yellow. Cough is productive of green/yellow sputum and not associated with time of day. Ears feel full, leading to sensation of muffled hearing. Has tried OTC cold preps without success. No GI complaints.  Also c/o headache behind right eye  No sick contacts, recent antibiotics, or recent travels.   No leg trauma, sedentary periods, h/o cancer, or tobacco use.  Past Medical History  Diagnosis Date  . Hypertension   . Anxiety   . Mental disorder   . Depression   . Sleep apnea   . GERD (gastroesophageal reflux disease)   . Headache   . Neuromuscular disorder   . 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 Fibromyalgia   . Arthritis   . Anemia 05/01/2012     Home Meds: Prior to Admission medications   Medication Sig Start Date End Date Taking? Authorizing Provider  albuterol (PROVENTIL HFA;VENTOLIN HFA) 108 (90 BASE) MCG/ACT inhaler Inhale 2 puffs into the lungs as needed.   Yes Historical Provider, MD  allopurinol (ZYLOPRIM) 300 MG tablet 1/2 tab every day 08/23/11  Yes [name], MD  ALPRAZolam Prudy Feeler) 1 MG tablet Take 1 tablet (1 mg total) by mouth as needed. 08/01/11  Yes [name], MD  azithromycin (ZITHROMAX Z-PAK) 250 MG tablet Take as directed on pack 06/30/12  Yes [name], MD  benazepril (LOTENSIN) 40 MG tablet Take 1 tablet (40 mg total) by mouth daily. 06/22/12  Yes [name], MD  BLACK COHOSH PO Take 1 tablet by mouth as needed.   Yes Historical Provider, MD    cetirizine (ZYRTEC) 10 MG tablet Take 10 mg by mouth daily.   Yes Historical Provider, MD  cyclobenzaprine (FLEXERIL) 10 MG tablet Take 10 mg by mouth daily.   Yes Historical Provider, MD  furosemide (LASIX) 20 MG tablet Take 20 mg by mouth every other day.   Yes Historical Provider, MD  HYDROcodone-acetaminophen (LORTAB) 10-500 MG per tablet Take 1 tablet by mouth as needed.   Yes Historical Provider, MD  HYDROcodone-homatropine (HYCODAN) 5-1.5 MG/5ML syrup Take 5 mLs by mouth every 8 (eight) hours as needed for cough. 06/30/12  Yes [name], MD  ibuprofen (ADVIL,MOTRIN) 600 MG tablet Take 600 mg by mouth as needed.   Yes Historical Provider, MD  lamoTRIgine (LAMICTAL) 100 MG tablet Take 0.5 tablets (50 mg total) by mouth daily. 08/01/11  Yes [name], MD  lipase/protease/amylase (CREON-10/PANCREASE) 12000 UNITS CPEP Take 3 capsules by mouth 3 (three) times daily before meals.   Yes Historical Provider, MD  metoprolol succinate (TOPROL-XL) 100 MG 24 hr tablet Take 1 tablet (100 mg total) by mouth daily. Take with or immediately following a meal. 08/01/11 07/31/12 Yes [name], MD  morphine (KADIAN) 30 MG 24 hr capsule Take 30 mg by mouth 3 (three) times daily.   Yes Historical Provider, MD  Multiple Vitamin (MULTIVITAMIN) tablet Take 1 tablet by mouth daily.   Yes Historical Provider, MD  omeprazole (PRILOSEC)  40 MG capsule Take 40 mg by mouth 2 (two) times daily.   Yes Historical Provider, MD  spironolactone (ALDACTONE) 100 MG tablet Take 1 tablet (100 mg total) by mouth daily. 08/01/11 07/31/12 Yes [name], MD    Allergies:  Allergies  Allergen Reactions  . Dilaudid (Hydromorphone Hcl) Other (See Comments)    Headache   . Pregabalin   . Metoclopramide Hcl Other (See Comments)    "crawling inside"  . Neurontin (Gabapentin) Other (See Comments)    Tremors   . Other Other (See Comments)    Avoid all SSRI's and SNRI's- damaged pancreas    History   Social  History  . Marital Status: Single    Spouse Name: N/A    Number of Children: N/A  . Years of Education: N/A   Occupational History  . Not on file.   Social History Main Topics  . Smoking status: Never Smoker   . Smokeless tobacco: Not on file  . Alcohol Use: No  . Drug Use: No  . Sexually Active: Yes   Other Topics Concern  . Not on file   Social History Narrative  . No narrative on file     Review of Systems: Constitutional: negativeweight changes Cardiovascular: negative for chest pain or palpitations Respiratory: negative for hemoptysis,or shortness of breath  Cough and wheezing persist Abdominal: negative for abdominal pain, nausea, vomiting or diarrhea Dermatological: negative for rash Neurologic: negative for headache   Physical Exam: Blood pressure 102/69, pulse 122, temperature 100.6 F (38.1 C), temperature source Oral, resp. rate 17, height 5\' 5"  (1.651 m), weight 293 lb (132.904 kg), SpO2 97.00%., Body mass index is 48.76 kg/(m^2). General: Well developed, well nourished, in no acute distress. Head: Normocephalic, atraumatic, eyes without discharge, sclera non-icteric, nares are congested. Bilateral auditory canals clear, TM's are without perforation, pearly grey with reflective cone of light bilaterally. No sinus TTP. Oral cavity moist, dentition normal. Posterior pharynx with thick yellow post nasal drip and mild erythema. No peritonsillar abscess or tonsillar exudate. Neck: Supple. No thyromegaly. Full ROM. No lymphadenopathy. Lungs: Coarse breath sounds bilaterally with wheezes, rales, or rhonchi. Breathing is unlabored.  Heart: RRR with S1 S2. No murmurs, rubs, or gallops appreciated. Msk:  Strength and tone normal for age. Extremities: No clubbing or cyanosis. No edema. Neuro: Alert and oriented X 3. Moves all extremities spontaneously. CNII-XII grossly in tact. Psych:  Responds to questions appropriately with a normal affect.   Labs:   ASSESSMENT AND  PLAN:  52 y.o. year old female with bronchitis. - -Mucinex -Tylenol/Motrin prn -Rest/fluids -RTC precautions -RTC 3-5 days if no improvement  Signed, [name], MD 07/02/2012 12:43 PM  Results for orders placed in visit on 07/02/12  POCT INFLUENZA A/B      Component Value Range   Influenza A, POC Negative     Influenza B, POC Negative    POCT CBC      Component Value Range   WBC 10.1  4.6 - 10.2 K/uL   Lymph, poc 2.5  0.6 - 3.4   POC LYMPH PERCENT 24.5  10 - 50 %L   MID (cbc) 0.6  0 - 0.9   POC MID % 6.0  0 - 12 %M   POC Granulocyte 7.0 (*) 2 - 6.9   Granulocyte percent 69.5  37 - 80 %G   RBC 4.51  4.04 - 5.48 M/uL   Hemoglobin 12.4  12.2 - 16.2 g/dL   HCT, POC 16.1  09.6 -  47.9 %   MCV 91.2  80 - 97 fL   MCH, POC 27.5  27 - 31.2 pg   MCHC 30.2 (*) 31.8 - 35.4 g/dL   RDW, POC 40.9     Platelet Count, POC 326  142 - 424 K/uL   MPV 9.5  0 - 99.8 fL   [facility] reading (PRIMARY) by  Dr. Milus Glazier:  CXR-no infiltrates.

## 2012-07-02 NOTE — Telephone Encounter (Signed)
Dr. Elbert Ewings   Patient is not any better, illness has spread to sinuses and other areas,   [phone]

## 2012-07-02 NOTE — Telephone Encounter (Signed)
Cough, more productive. Sinuses are now painful, she states she still has fever, per your note from last OV, if worse return to clinic. I advised her to return, she states she is on her way.

## 2012-07-04 MED ORDER — HYDROCOD POLST-CHLORPHEN POLST 10-8 MG/5ML PO LQCR: 5.0000 mL | Freq: Two times a day (BID) | ORAL | Status: DC

## 2012-07-04 NOTE — Telephone Encounter (Signed)
Pt states she is not any better, the illness is spreading into her lungs and sinus.  She would like to speak with Dr. Elbert Ewings    CBN:  [phone]

## 2012-07-04 NOTE — Telephone Encounter (Signed)
Spoke with patient she doesn't have the money the money to get a humidifier.  Her cough is not terrible but she is getting up yellow mucus with some blood.  She had finished her antibiotic and still on her prednisone.  She is not any better.  Please advise.

## 2012-07-04 NOTE — Telephone Encounter (Signed)
Have Ms. Gibbon add a humidifier at night.  Also, please call her in some Tussionex, 3 oz, 5 ml q12 prn.

## 2012-07-04 NOTE — Telephone Encounter (Signed)
Called pt to advise of new cough med and instr's. Pt stated that she told us earlier that the cough is not the problem and she doesn't need the tussionex. She stated that she still has green/yellow mucus in sinuses and sputum when she does cough. Pt stated that the Abx did not help at all and she thinks that she needs a different Abx sent to pharmacy since the first one did not work. Dr L, please advise. Pt did not want another provider to review the message, she wanted Dr L to review.

## 2012-07-04 NOTE — Telephone Encounter (Signed)
Called in Rx for tussionex to [facility].

## 2012-07-04 NOTE — Telephone Encounter (Signed)
As requested earlier, please call her in the tussionex.  Try to increase fluids if she cannot afford a humidifier.

## 2012-07-04 NOTE — Telephone Encounter (Signed)
Patient has been advised to return to clinic, declines, would like message sent to Dr L. only

## 2012-07-05 ENCOUNTER — Ambulatory Visit (INDEPENDENT_AMBULATORY_CARE_PROVIDER_SITE_OTHER): Payer: Medicare Other | Admitting: Emergency Medicine

## 2012-07-05 ENCOUNTER — Telehealth: Payer: Self-pay | Admitting: Radiology

## 2012-07-05 ENCOUNTER — Ambulatory Visit: Payer: Medicare Other

## 2012-07-05 ENCOUNTER — Other Ambulatory Visit: Payer: Self-pay | Admitting: Emergency Medicine

## 2012-07-05 VITALS — BP 134/85 | HR 79 | Temp 99.0°F | Resp 17 | Ht 65.5 in | Wt 292.0 lb

## 2012-07-05 DIAGNOSIS — J40 Bronchitis, not specified as acute or chronic: Secondary | ICD-10-CM

## 2012-07-05 DIAGNOSIS — R0981 Nasal congestion: Secondary | ICD-10-CM

## 2012-07-05 DIAGNOSIS — F4310 Post-traumatic stress disorder, unspecified: Secondary | ICD-10-CM

## 2012-07-05 DIAGNOSIS — F419 Anxiety disorder, unspecified: Secondary | ICD-10-CM

## 2012-07-05 DIAGNOSIS — J329 Chronic sinusitis, unspecified: Secondary | ICD-10-CM | POA: Diagnosis not present

## 2012-07-05 DIAGNOSIS — R05 Cough: Secondary | ICD-10-CM

## 2012-07-05 DIAGNOSIS — J45909 Unspecified asthma, uncomplicated: Secondary | ICD-10-CM

## 2012-07-05 DIAGNOSIS — R059 Cough, unspecified: Secondary | ICD-10-CM

## 2012-07-05 MED ORDER — CEFDINIR 300 MG PO CAPS: 600.0000 mg | ORAL_CAPSULE | Freq: Every day | ORAL | Status: DC

## 2012-07-05 MED ORDER — ALPRAZOLAM 1 MG PO TABS: 1.0000 mg | ORAL_TABLET | ORAL | Status: DC | PRN

## 2012-07-05 MED ORDER — ALBUTEROL SULFATE HFA 108 (90 BASE) MCG/ACT IN AERS: 2.0000 | INHALATION_SPRAY | RESPIRATORY_TRACT | Status: DC | PRN

## 2012-07-05 MED ORDER — PREDNISONE 10 MG PO TABS: ORAL_TABLET | ORAL | Status: DC

## 2012-07-05 MED ORDER — ALBUTEROL SULFATE (2.5 MG/3ML) 0.083% IN NEBU
2.5000 mg | INHALATION_SOLUTION | Freq: Once | RESPIRATORY_TRACT | Status: AC
  Administered 2012-07-05: 2.5 mg via RESPIRATORY_TRACT

## 2012-07-05 MED ORDER — LEVOFLOXACIN 500 MG PO TABS: 500.0000 mg | ORAL_TABLET | Freq: Every day | ORAL | Status: DC

## 2012-07-05 NOTE — Patient Instructions (Signed)

## 2012-07-05 NOTE — Progress Notes (Signed)
  Subjective:    Patient ID: Valerie [name], female    DOB: [birth year], 52 y.o.   MRN: [id number]  HPI patient continues to be ill. She has a lot of head congestion. She is having wheezing and tightness in her chest. She has a significant amount of facial pain and discomfort with a purulent nasal drainage. She also has a periodic productive cough . Patient also requesting a refill on her Xanax. She has a history of PTSD. She sees a Veterinary surgeon every week. She usually takes the medication only when she is having problems    Review of Systems     Objective:   Physical Exam TMs are clear. Examination nose reveals purulent drainage from the right. Left nares is normal. Throat exam reveals a yellow thick post nasal drip    [facility] reading (PRIMARY) by  Dr.Peggy Monk right maxillary sinusitis      Assessment & Plan:  The patient began prednisone 40 4040 3030 3020 2020 03/21/2009. We'll change to omnicef 600 qd for  10 days she is to use her albuterol inhaler every 4-6 hours

## 2012-07-05 NOTE — Telephone Encounter (Signed)
Patient called, tearful, she has gotten Levaquin and Omnicef. Unsure which she needs/ discussed with Rhoderick Moody PA and Dr Cleta Alberts, she needs Omnicef, not Levaquin. Patient advised, if she needs anything else she will let me know. Amy

## 2012-07-08 ENCOUNTER — Telehealth: Payer: Self-pay

## 2012-07-08 NOTE — Telephone Encounter (Signed)
Error, pt decided to not leave a message!

## 2012-07-10 DIAGNOSIS — F4323 Adjustment disorder with mixed anxiety and depressed mood: Secondary | ICD-10-CM | POA: Diagnosis not present

## 2012-07-15 ENCOUNTER — Telehealth: Payer: Self-pay

## 2012-07-15 ENCOUNTER — Telehealth: Payer: Self-pay | Admitting: Physician Assistant

## 2012-07-15 ENCOUNTER — Ambulatory Visit (INDEPENDENT_AMBULATORY_CARE_PROVIDER_SITE_OTHER): Payer: Medicare Other | Admitting: Family Medicine

## 2012-07-15 VITALS — BP 122/82 | HR 108 | Temp 98.4°F | Resp 18 | Ht 64.5 in | Wt 290.2 lb

## 2012-07-15 DIAGNOSIS — R059 Cough, unspecified: Secondary | ICD-10-CM | POA: Diagnosis not present

## 2012-07-15 DIAGNOSIS — R05 Cough: Secondary | ICD-10-CM

## 2012-07-15 MED ORDER — HYDROCOD POLST-CHLORPHEN POLST 10-8 MG/5ML PO LQCR: 5.0000 mL | Freq: Two times a day (BID) | ORAL | Status: DC

## 2012-07-15 MED ORDER — FLUTICASONE PROPIONATE 50 MCG/ACT NA SUSP: 2.0000 | Freq: Every day | NASAL | Status: DC

## 2012-07-15 NOTE — Telephone Encounter (Signed)
PT STATES SHE STILL HAVE A BAD SINUS INFECTION AND THE ANTIBIOTICS ARE GONE, SHE IS A LITTLE BETTER, BUT NOT MUCH PLEASE CALL [phone]    [name] [address] ON [address]

## 2012-07-15 NOTE — Telephone Encounter (Signed)
She took omnicef.

## 2012-07-15 NOTE — Telephone Encounter (Signed)
I will send in Flonase for her to use BID. Unfortunately at this point I do not think another round of antibiotics will be beneficial.  She may need another OV or possibly a referral to ENT if she is unwilling to try Flonase.

## 2012-07-15 NOTE — Telephone Encounter (Signed)
Spoke with pt and she says that the nasal sprays aren't really doing much good because her nasal passages are very clear, it's the PND that's bothering her the most, to the point of choking. Says she can't afford to see a specialist, so advised to RTC and she said she would to see Dr. Abundio Miu of tomorrow

## 2012-07-15 NOTE — Telephone Encounter (Signed)
Pharmacy called stating Medicare will not cover any cough suppressant medication.  Ok to do Robitussin with codeine per pharmacy's suggestion

## 2012-07-15 NOTE — Telephone Encounter (Signed)
I have called her and she states she is still having nasal drainage and does not want to take Mucinex any more. She states she has right sided facial pain. Is there anything else we can recommend for her to try for the sinuses.

## 2012-07-15 NOTE — Progress Notes (Signed)
2 1/2 weeks of cough, sinus congestion and malaise.  Some hot, sweaty episodes since taking the prednisone.  Marked Postnasal drainage which is now clear.  Taking Cefdinir (finished yesterday) and prednisone (also finished yesterday).  Finished Z pak and 1/2 of medrol dospak. Using inhalers and Zyrtec without success.  Symptoms just continue to aggravate and keep patient awake.  Objective:  Persistent cough during visit Chest:  Clear TM's clear Oroph:  Clear Neck: diaphoretic without adenopathy.  Assessment:  Persistent viral cough  Plan:  1. Cough  chlorpheniramine-HYDROcodone (TUSSIONEX) 10-8 MG/5ML [facility]

## 2012-07-16 ENCOUNTER — Telehealth: Payer: Self-pay

## 2012-07-16 NOTE — Telephone Encounter (Signed)
PATIENT CALLED WANTING TO LEAVE A MESSAGE FOR DR. Milus Glazier SAYING THANK YOU.  HIS TREATMENT FOR HER RESPIRATORY ISSUES FINALLY WORKED AND SHE WAS ABLE TO GET SOME REST.  PLEASE CALL [phone] IF NEEDED.

## 2012-07-22 ENCOUNTER — Other Ambulatory Visit: Payer: Self-pay | Admitting: Family Medicine

## 2012-07-26 ENCOUNTER — Other Ambulatory Visit: Payer: Self-pay | Admitting: Physician Assistant

## 2012-08-15 DIAGNOSIS — R5381 Other malaise: Secondary | ICD-10-CM | POA: Diagnosis not present

## 2012-08-15 DIAGNOSIS — M2550 Pain in unspecified joint: Secondary | ICD-10-CM | POA: Diagnosis not present

## 2012-08-15 DIAGNOSIS — G4700 Insomnia, unspecified: Secondary | ICD-10-CM | POA: Diagnosis not present

## 2012-08-15 DIAGNOSIS — IMO0001 Reserved for inherently not codable concepts without codable children: Secondary | ICD-10-CM | POA: Diagnosis not present

## 2012-08-15 DIAGNOSIS — R6889 Other general symptoms and signs: Secondary | ICD-10-CM | POA: Diagnosis not present

## 2012-08-15 DIAGNOSIS — E559 Vitamin D deficiency, unspecified: Secondary | ICD-10-CM | POA: Diagnosis not present

## 2012-08-15 DIAGNOSIS — Z79899 Other long term (current) drug therapy: Secondary | ICD-10-CM | POA: Diagnosis not present

## 2012-08-22 DIAGNOSIS — Z111 Encounter for screening for respiratory tuberculosis: Secondary | ICD-10-CM | POA: Diagnosis not present

## 2012-09-03 ENCOUNTER — Ambulatory Visit: Payer: Medicare Other

## 2012-09-03 ENCOUNTER — Ambulatory Visit (INDEPENDENT_AMBULATORY_CARE_PROVIDER_SITE_OTHER): Payer: Medicare Other | Admitting: Family Medicine

## 2012-09-03 VITALS — BP 110/70 | HR 75 | Temp 98.5°F | Resp 18 | Ht 65.0 in | Wt 294.0 lb

## 2012-09-03 DIAGNOSIS — R0789 Other chest pain: Secondary | ICD-10-CM | POA: Diagnosis not present

## 2012-09-03 DIAGNOSIS — R600 Localized edema: Secondary | ICD-10-CM

## 2012-09-03 MED ORDER — FUROSEMIDE 20 MG PO TABS: ORAL_TABLET | ORAL | Status: DC

## 2012-09-03 NOTE — Progress Notes (Signed)
52 yo woman with substernal chest pain which began 3 weeks ago.  Initially, there were lateral visual scotoma which have come and gone since, most recently 2 days ago.  The chest tightness seems to be gone in the morning but as the day goes on, the tightness come on in the front of her chest.  No shortness of breath.  No nausea.  This does not ressemble GERD symptoms which have been under control after BOTOX injection.  Albuterol seemed to help  Objective:  Good color, ;NAD  Chest:  Few wheezes at bases, good breath sounds bilaterally Heart: regular, no murmur [facility] reading (PRIMARY) by  Dr. Milus Glazier:  CXR small amount of atelectasis RLL.    PF 625(best)  (predicted 350)  Assessment:  Mild RAD Plan:  Albuterol inhaler twice a day for a week. RTC if symptoms persist or worsen

## 2012-09-04 DIAGNOSIS — F329 Major depressive disorder, single episode, unspecified: Secondary | ICD-10-CM | POA: Diagnosis not present

## 2012-09-04 DIAGNOSIS — H3582 Retinal ischemia: Secondary | ICD-10-CM | POA: Diagnosis not present

## 2012-09-04 DIAGNOSIS — M545 Low back pain, unspecified: Secondary | ICD-10-CM | POA: Diagnosis not present

## 2012-09-04 DIAGNOSIS — K859 Acute pancreatitis without necrosis or infection, unspecified: Secondary | ICD-10-CM | POA: Diagnosis not present

## 2012-09-04 DIAGNOSIS — E669 Obesity, unspecified: Secondary | ICD-10-CM | POA: Diagnosis not present

## 2012-09-04 DIAGNOSIS — H524 Presbyopia: Secondary | ICD-10-CM | POA: Diagnosis not present

## 2012-09-04 DIAGNOSIS — K219 Gastro-esophageal reflux disease without esophagitis: Secondary | ICD-10-CM | POA: Diagnosis not present

## 2012-09-04 DIAGNOSIS — R1013 Epigastric pain: Secondary | ICD-10-CM | POA: Diagnosis not present

## 2012-09-04 DIAGNOSIS — Z79899 Other long term (current) drug therapy: Secondary | ICD-10-CM | POA: Diagnosis not present

## 2012-09-04 DIAGNOSIS — Z9889 Other specified postprocedural states: Secondary | ICD-10-CM | POA: Diagnosis not present

## 2012-09-04 DIAGNOSIS — F3289 Other specified depressive episodes: Secondary | ICD-10-CM | POA: Diagnosis not present

## 2012-09-04 DIAGNOSIS — F4310 Post-traumatic stress disorder, unspecified: Secondary | ICD-10-CM | POA: Diagnosis not present

## 2012-09-04 DIAGNOSIS — H2510 Age-related nuclear cataract, unspecified eye: Secondary | ICD-10-CM | POA: Diagnosis not present

## 2012-09-04 DIAGNOSIS — H5210 Myopia, unspecified eye: Secondary | ICD-10-CM | POA: Diagnosis not present

## 2012-09-04 DIAGNOSIS — K3189 Other diseases of stomach and duodenum: Secondary | ICD-10-CM | POA: Diagnosis not present

## 2012-09-04 DIAGNOSIS — M79609 Pain in unspecified limb: Secondary | ICD-10-CM | POA: Diagnosis not present

## 2012-09-06 DIAGNOSIS — K3184 Gastroparesis: Secondary | ICD-10-CM | POA: Diagnosis not present

## 2012-09-07 ENCOUNTER — Telehealth: Payer: Self-pay

## 2012-09-07 DIAGNOSIS — H53131 Sudden visual loss, right eye: Secondary | ICD-10-CM

## 2012-09-07 NOTE — Telephone Encounter (Signed)
PATIENT CALLING TO ASK DR. L IF HE HAS RECEIVED A LETTER FROM HER IN A REGULAR SIZE [place]ELOPE. SHE WAS HOPING TO GET A CALL FROM DR. L ABOUT IT.  BEST; [phone]

## 2012-09-09 NOTE — Telephone Encounter (Signed)
Have you gotten a letter from her?

## 2012-09-10 ENCOUNTER — Telehealth: Payer: Self-pay | Admitting: Radiology

## 2012-09-10 NOTE — Telephone Encounter (Signed)
Thanks, I have advised her of this. Please send letter for scanning/ Jovonna Nickell

## 2012-09-10 NOTE — Telephone Encounter (Signed)
I have reviewed her letter and all of her labs.  I was concerned that if blood flow to retina was compromised, there may be other circulatory issues that cause fatigue, hence the neurology referral.  I don't have a definite suggestion re  Further testing.  I do think depression may be playing a role.  I wonder if she could run this by her psychologist, or I could see about a referral.  Please consider this since these symptoms are so debilitating and we need to get some help.

## 2012-09-10 NOTE — Telephone Encounter (Signed)
Patient called back to advise the spot with her retina is being followed by her Opthamologist and the Opthamologist thinks it is from lack of blood flow, patient does not think she needs to see a Neurologist. She is concerned about extreme fatigue, she states she falls asleep multiple times during the day, and sleeps greater than 12 hours per day. She states her chest tightness has gotten slightly better. She states she feels like her blood pressure drops when she is sitting. Her blood pressure was 90/40 when she was in hospital currently, her BP has been normal.

## 2012-09-10 NOTE — Addendum Note (Signed)
Addended by: [name] on: 09/10/2012 10:57 AM   Modules accepted: Orders

## 2012-09-10 NOTE — Telephone Encounter (Signed)
Yes, I read the letter.  I realize she feels horrible in many ways and the retina problem is worrisome.  I will order a neurology consultation

## 2012-09-11 NOTE — Telephone Encounter (Signed)
Called patient to advise. She has already discussed with the psychologist. She is advised neurology appt pending, will get this set up for her, she has previously seen Dr Anne Hahn, I advised her when they get referral, it will be made with Dr Anne Hahn. Valerie [name]

## 2012-09-11 NOTE — Telephone Encounter (Signed)
Thanks

## 2012-09-12 ENCOUNTER — Telehealth: Payer: Self-pay

## 2012-09-12 ENCOUNTER — Other Ambulatory Visit: Payer: Self-pay | Admitting: Physician Assistant

## 2012-09-12 NOTE — Telephone Encounter (Signed)
Pt called because she got a notice of appt w/Dr Haroldine Laws at [facility] and wondered why she was referred to him instead of the neurologist she has seen in the past, Dr Anne Hahn at [facility]. Dr. Elbert Ewings, I see in the referral notes that you wanted pt referred to Dr Colbert Coyer at [facility] (she was scheduled with a different MD at this practice). Did you have a particular reason you wanted pt to see her, or is Dr Anne Hahn fine with you?

## 2012-09-12 NOTE — Telephone Encounter (Signed)
I think this would be reasonable to get a second opiniion

## 2012-09-13 NOTE — Telephone Encounter (Signed)
Called patient to advise we will send referral to Dr Anne Hahn. [name] will you send information for referral to Dr Anne Hahn? Patient will call there also.

## 2012-09-18 ENCOUNTER — Other Ambulatory Visit: Payer: Self-pay | Admitting: Family Medicine

## 2012-10-18 ENCOUNTER — Other Ambulatory Visit: Payer: Self-pay | Admitting: Family Medicine

## 2012-10-18 ENCOUNTER — Telehealth: Payer: Self-pay

## 2012-10-18 DIAGNOSIS — G479 Sleep disorder, unspecified: Secondary | ICD-10-CM

## 2012-10-18 NOTE — Telephone Encounter (Signed)
Because she requested 2nd opinion.  To you, she asked for you to call her, do you want her to come back in?

## 2012-10-18 NOTE — Telephone Encounter (Signed)
Pt called upset and confused about neurology referral. States her neurologist is here in [place] and she's been referred to a dr she doesn't know in [place]. States she cancelled that appt. Doesn't understand why shes being referred for a vision problem. She already has an eye dr that is following that problem. She thought she was being referred for a sleep problem. States if the referral is only for the vision issue, she doesn't need it. If its for her sleep related issue, she would like to go to her neurologist in .   Pt asks dr Milus Glazier to cal her.    bf

## 2012-10-18 NOTE — Telephone Encounter (Signed)
Sorry.  I misunderstood the request.  Will make the referral

## 2012-10-21 ENCOUNTER — Encounter: Payer: Self-pay | Admitting: Neurology

## 2012-10-21 ENCOUNTER — Ambulatory Visit (INDEPENDENT_AMBULATORY_CARE_PROVIDER_SITE_OTHER): Payer: Medicare Other | Admitting: Neurology

## 2012-10-21 DIAGNOSIS — G4710 Hypersomnia, unspecified: Secondary | ICD-10-CM

## 2012-10-21 DIAGNOSIS — J45909 Unspecified asthma, uncomplicated: Secondary | ICD-10-CM

## 2012-10-21 DIAGNOSIS — G4730 Sleep apnea, unspecified: Secondary | ICD-10-CM

## 2012-10-21 DIAGNOSIS — G4733 Obstructive sleep apnea (adult) (pediatric): Secondary | ICD-10-CM | POA: Insufficient documentation

## 2012-10-21 DIAGNOSIS — Z9989 Dependence on other enabling machines and devices: Secondary | ICD-10-CM | POA: Insufficient documentation

## 2012-10-21 MED ORDER — MODAFINIL 200 MG PO TABS: 200.0000 mg | ORAL_TABLET | Freq: Every day | ORAL | Status: DC

## 2012-10-21 NOTE — Assessment & Plan Note (Signed)
Needs a re-titration,  residual APNEA index too high, Epworth high at 15 points, and weight gain. Since initial PSG titration.

## 2012-10-21 NOTE — Progress Notes (Signed)
[facility]  Provider:  Dr Kumari Sculley Referring Provider: [name], MD Primary Care Physician:  [name], MD  Chief Complaint  Patient presents with  . 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 Milus Glazier for  OSA , diagnosed after a  sleep study on  12-22-2008 at [facility]. The patient  has gained 18 pounds since the last study ,  Her primary neurologist is Dr Lesia Sago.  Patient has waited a long time for her appointment she had to reschedule the first consult visit because of his illness of her mother is, and the second because of herself being admitted to hospital ischemic colitis. The patient reports a history of snoring and daytime excessive sleepiness and was with the same complaints evaluated 4 years ago in in [place] for Dr. sleep apnea. The overall AHI was 46.4 patient had 8 obstructive apneas nor mixed apneas 1 central apnea and 122 shallow breathing spells,  hypopneas . Oxygen desaturations to a nadir of 64% . The patient  Was titrated to CPAP , received a recommendation to use CPAP at 9 cm water pressure.  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  Epworth score is 15 points here today.  Last night, she had 9 hours of bed time, not necessarily sleep. She often falls asleep on the sofa or recliner.    Dr. Anne Hahn is no 2 years ago the patient has morbid obesity, fibromyalgia, interstitial cystitis, hypertension, elevated uric acid levels, gastroesophageal reflux gastroparesis, sleep apnea, pancreatitis, the she has seen Dr. Anne Hahn for a left leg pain and lower back pain as well as right upper extremity discomfort in the past she also has degenerative disc  disease, degenerative arthritis, status post sinus surgery and has asthma.  Last 2 nights she had vivid dreams. She is fatigued.  She brought her CPAP machine , the chip card was not fully inserted. Since her respiratory infection she struggled with using CPAP, residual AHI is 8.3 and 55% user time since January.    She has supplies from [place] - only  DME follow up , and never had a download- nor MD visit since .  The patient reports that she contracted a respiratory infection in the second week of January this year and since then has been more fatigued and sleepy again,  in spite of using her CPAP.     Review of Systems: Out of a complete 14 system review, the patient complains of only the following symptoms, and all other reviewed systems are negative. Epworth 15.   History   Social History  . Marital Status: Single    Spouse Name: N/A    Number of Children: 0  . Years of Education: College   Occupational History  . disabled    Social History Main Topics  . Smoking status: Never Smoker   . Smokeless tobacco: Never Used  . Alcohol Use: No  . Drug Use: No  . Sexually Active: Yes   Other Topics Concern  . Not on file   Social History Narrative  . No narrative on file    Family History  Problem  Relation Age of Onset  . Heart disease Mother   . COPD Mother   . Arthritis Mother   . Cancer Mother   . Hypertension Mother   . Arthritis Paternal Grandmother   . Heart disease Paternal Grandmother   . Heart disease Paternal Grandfather     Past Medical History  Diagnosis Date  . Hypertension   . Anxiety   . Mental disorder   . Depression   . Sleep apnea   . Headache   . Neuromuscular disorder   . 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 Fibromyalgia   . Arthritis     degenerative  . Anemia 05/01/2012  . Gastro-esophageal reflux   . Sleep apnea     CPAP-9  . Pancreatitis   . Diarrhea     EPISODIC  . Back pain     lower    Past Surgical History   Procedure Laterality Date  . Cholecystectomy    . Rotator cuff repair  bilateral  . Nasal septoplasty w/ turbinoplasty    . Esophagogastroduodenoscopy  12/21/2011    Procedure: ESOPHAGOGASTRODUODENOSCOPY (EGD);  Surgeon: Barrie Folk, MD;  Location: [facility] ENDOSCOPY;  Service: Endoscopy;  Laterality: N/A;  . Gallbladder surgery    . Flexible sigmoidoscopy  05/01/2012    Procedure: FLEXIBLE SIGMOIDOSCOPY;  Surgeon: Petra Kuba, MD;  Location: [facility] ENDOSCOPY;  Service: Endoscopy;  Laterality: N/A;  unprepped  . Aortic valve replacement (avr)/coronary artery bypass grafting (cabg)    . Biceps tendon repair      13  . Nasal sinus surgery      Current Outpatient Prescriptions  Medication Sig Dispense Refill  . albuterol (PROVENTIL HFA;VENTOLIN HFA) 108 (90 BASE) MCG/ACT inhaler Inhale 2 puffs into the lungs as needed.  6.7 g  6  . allopurinol (ZYLOPRIM) 300 MG tablet TAKE 1/2 TABLET AS DIRECTED  30 tablet  5  . ALPRAZolam (XANAX) 1 MG tablet Take 1 tablet (1 mg total) by mouth as needed.  30 tablet  0  . benazepril (LOTENSIN) 40 MG tablet Take 1 tablet (40 mg total) by mouth daily.  90 tablet  3  . BLACK COHOSH PO Take 1 tablet by mouth as needed.      . furosemide (LASIX) 20 MG tablet One every other day  30 tablet  3  . HYDROCODONE-ACETAMINOPHEN PO Take 10-325 mg by mouth. One tablet daily as needed      . ibuprofen (ADVIL,MOTRIN) 600 MG tablet Take 600 mg by mouth as needed.      . lamoTRIgine (LAMICTAL) 100 MG tablet TAKE 1/2 TABLET BY MOUTH DAILY  30 tablet  3  . lipase/protease/amylase (CREON-10/PANCREASE) 12000 UNITS CPEP Take 3 capsules by mouth 3 (three) times daily before meals.      . metoprolol succinate (TOPROL-XL) 100 MG 24 hr tablet TAKE 1 TABLET BY MOUTH EACH DAY.Marland KitchenTAKE WITH OR IMMEDIATELY FOLLOWING AMEAL  30 tablet  3  . omeprazole (PRILOSEC) 40 MG capsule Take 40 mg by mouth 2 (two) times daily.      . pseudoephedrine (SUDAFED) 30 MG/5ML syrup Take 30 mg by mouth 4 (four) times  daily as needed.      Marland Kitchen spironolactone (ALDACTONE) 100 MG tablet TAKE 1 TABLET BY MOUTH DAILY  30 tablet  3  . Vitamin D, Ergocalciferol, (DRISDOL) 50000 UNITS CAPS Take 50,000 Units by mouth.      . morphine (MS CONTIN) 30 MG 12 hr tablet Not from [facility]  No current facility-administered medications for this visit.    Allergies as of 10/21/2012 - Review Complete 10/21/2012  Allergen Reaction Noted  . Dilaudid (hydromorphone hcl) Other (See Comments) 08/01/2011  . Pregabalin  08/01/2011  . Metoclopramide hcl Other (See Comments) 08/01/2011  . Neurontin (gabapentin) Other (See Comments) 08/01/2011  . Other Other (See Comments) 08/01/2011  . Reglan (metoclopramide)  10/21/2012    Vitals: BP 130/80  Pulse 69  Temp(Src) 98.7 F (37.1 C) (Oral)  Ht 5\' 5"  (1.651 m)  Wt 299 lb (135.626 kg)  BMI 49.76 kg/m2 Last Weight:  Wt Readings from Last 1 Encounters:  10/21/12 299 lb (135.626 kg)   Last Height:   Ht Readings from Last 1 Encounters:  10/21/12 5\' 5"  (1.651 m)   Vision Screening:  Vitals   Physical exam:  General: The patient is awake, alert and appears not in acute distress. The patient is well groomed. Head: Normocephalic, atraumatic. Neck is supple. Mallampati 1, neck circumference:19 inches . Cardiovascular:  Regular rate and rhythm, without  murmurs or carotid bruit, and without distended neck veins. Respiratory: Lungs are clear to auscultation. Skin:  Without evidence of edema, or rash Trunk: BMI is severely  Elevated  and patient  has normal posture.  Neurologic exam : The patient is awake and alert, oriented to place and time.  Memory subjective  described as intact. There is a normal attention span & concentration ability.  Speech is fluent without dysarthria, dysphonia or aphasia. Mood and affect are appropriate.  Cranial nerves: Pupils are equal and briskly reactive to light. Funduscopic exam without evidence of pallor  (or edema). Extraocular movements  in  vertical and horizontal planes intact and without nystagmus . Visual fields by finger perimetry are intact. Hearing to finger rub intact.   Facial sensation intact to fine touch. Facial motor strength is symmetric and tongue and uvula move midline.  Motor exam:   Normal tone and normal muscle bulk and symmetric normal strength in all extremities. ROM limitation in  Right hip,  Left and right shoulder.   Sensory:  Fine touch, pinprick and vibration were tested in all extremities. Proprioception is tested in the upper extremities only. This was normal.  Coordination: Rapid alternating movements in the fingers/hands is tested and normal.  Gait and station: Patient walks without assistive device and is able unassisted to climb up to the exam table.  Deep tendon reflexes: in the  upper and lower extremities are symmetric and intact. Babinski maneuver response is downgoing.   Assessment:  After physical and neurologic examination, review of laboratory studies, imaging, neurophysiology testing and pre-existing records, assessment will be reviewed on the problem list.  Plan:  Treatment plan and additional workup will be reviewed under Problem List.

## 2012-10-21 NOTE — Patient Instructions (Signed)
Sleep Apnea Sleep apnea is when a person's sleep is disrupted many times. This makes a person tired during the day. There are two types of sleep apnea. One is when breathing stops for a short time because your airway is blocked (obstructive sleep apnea). The other type is when the brain sometimes fails to give the normal signal to breathe (central sleep apnea).  HOME CARE  Do not sleep on your back.  Take all medicine as told by your doctor.  Avoid alcohol, calming medicines (sedatives), and depressant drugs.  Use your CPAP (a mask-like machine and pump) to keep your airway open while sleeping.  If you are overweight, try to lose some weight. Talk to your doctor about a healthy weight goal.  If the problem is related to heart failure, take medicines as told by your doctor. MAKE SURE YOU:  Understand these instructions.  Will watch your condition.  Will get help right away if you are not doing well or get worse. Document Released: 03/07/2008 Document Revised: 08/21/2011 Document Reviewed: 03/07/2008 [facility] Patient Information 2013 [place], [place]. Hypersomnia Hypersomnia usually brings recurrent episodes of excessive daytime sleepiness or prolonged nighttime sleep. It is different than feeling tired due to lack of or interrupted sleep at night. People with hypersomnia are compelled to nap repeatedly during the day. This is often at inappropriate times such as:  At work.  During a meal.  In conversation. These daytime naps usually provide no relief. This disorder typically affects adolescents and young adults. CAUSES  This condition may be caused by:  Another sleep disorder (such as narcolepsy or sleep apnea).  Dysfunction of the autonomic nervous system.  Drug or alcohol abuse.  A physical problem, such as:  A tumor.  Head trauma. This is damage caused by an accident.  Injury to the central nervous system.  Certain medications, or medicine withdrawal.  Medical  conditions may contribute to the disorder, including:  Multiple sclerosis.  Depression.  Encephalitis.  Epilepsy.  Obesity.  Some people appear to have a genetic predisposition to this disorder. In others, there is no known cause. SYMPTOMS   Patients often have difficulty waking from a long sleep. They may feel dazed or confused.  Other symptoms may include:  Anxiety.  Increased irritation (inflammation).  Decreased energy.  Restlessness.  Slow thinking.  Slow speech.  Loss of appetite.  Hallucinations.  Memory difficulty.  Tremors, Tics.  Some patients lose the ability to function in family, social, occupational, or other settings. TREATMENT  Treatment is symptomatic in nature. Stimulants and other drugs may be used to treat this disorder. Changes in behavior may help. For example, avoid night work and social activities that delay bed time. Changes in diet may offer some relief. Patients should avoid alcohol and caffeine. PROGNOSIS  The likely outcome (prognosis) for persons with hypersomnia depends on the cause of the disorder. The disorder itself is not life threatening. But it can have serious consequences. For example, automobile accidents can be caused by falling asleep while driving. The attacks usually continue indefinitely. Document Released: 05/19/2002 Document Revised: 08/21/2011 Document Reviewed: 04/22/2008 [facility] Patient Information 2013 [place], [place].

## 2012-10-22 ENCOUNTER — Telehealth: Payer: Self-pay

## 2012-10-22 NOTE — Telephone Encounter (Signed)
Patient called stating Valerie [name] is not covered on her insurance and a prior Berkley Harvey is needed.  I will initiate prior auth.  Pending Ins response.

## 2012-10-25 DIAGNOSIS — R1011 Right upper quadrant pain: Secondary | ICD-10-CM | POA: Diagnosis not present

## 2012-10-25 DIAGNOSIS — K3184 Gastroparesis: Secondary | ICD-10-CM | POA: Diagnosis not present

## 2012-11-01 ENCOUNTER — Other Ambulatory Visit: Payer: Self-pay | Admitting: Gastroenterology

## 2012-11-03 ENCOUNTER — Ambulatory Visit: Payer: Medicare Other

## 2012-11-03 ENCOUNTER — Ambulatory Visit (INDEPENDENT_AMBULATORY_CARE_PROVIDER_SITE_OTHER): Payer: Medicare Other | Admitting: Family Medicine

## 2012-11-03 VITALS — BP 108/70 | HR 75 | Temp 97.9°F | Resp 16 | Ht 64.5 in | Wt 295.0 lb

## 2012-11-03 DIAGNOSIS — K5732 Diverticulitis of large intestine without perforation or abscess without bleeding: Secondary | ICD-10-CM

## 2012-11-03 DIAGNOSIS — R1031 Right lower quadrant pain: Secondary | ICD-10-CM

## 2012-11-03 LAB — POCT CBC
Granulocyte percent: 64 %G (ref 37–80)
HCT, POC: 39.7 % (ref 37.7–47.9)
Hemoglobin: 12.4 g/dL (ref 12.2–16.2)
Lymph, poc: 3 (ref 0.6–3.4)
MCH, POC: 29.5 pg (ref 27–31.2)
MCHC: 31.2 g/dL — AB (ref 31.8–35.4)
MCV: 94.6 fL (ref 80–97)
MID (cbc): 0.5 (ref 0–0.9)
MPV: 9.3 fL (ref 0–99.8)
POC Granulocyte: 6.3 (ref 2–6.9)
POC LYMPH PERCENT: 30.9 %L (ref 10–50)
POC MID %: 5.1 %M (ref 0–12)
Platelet Count, POC: 305 10*3/uL (ref 142–424)
RBC: 4.2 M/uL (ref 4.04–5.48)
RDW, POC: 15.1 %
WBC: 9.8 10*3/uL (ref 4.6–10.2)

## 2012-11-03 MED ORDER — METRONIDAZOLE 500 MG PO TABS: 500.0000 mg | ORAL_TABLET | Freq: Three times a day (TID) | ORAL | Status: DC

## 2012-11-03 NOTE — Progress Notes (Signed)
52 yo with chronic reflux who is scheduled for repeat botox injection of pylorus this coming May 28.  Patient now on azithromycin to improve upper gastric motility.  Patient has had some hypoxia during sleep.  CPAP is set at 9 cm of H2O.  Dr. Vickey Huger has started her on Provigil.  Patient experiencing a new type of abdominal pain for past 7 weeks which is unrelated to gas, BM, or food.  It was originally intermittent and now is becoming more intense and frequent.  Looking into program Scientist, water quality) at [facility]  Objective:  NAD Good color Chest: normal clear Abdomen:  Soft, no HSM or masses, tender right lower quadrant with some rebound when palpating the contralateral side Results for orders placed in visit on 11/03/12  POCT CBC      Result Value Range   WBC 9.8  4.6 - 10.2 K/uL   Lymph, poc 3.0  0.6 - 3.4   POC LYMPH PERCENT 30.9  10 - 50 %L   MID (cbc) 0.5  0 - 0.9   POC MID % 5.1  0 - 12 %M   POC Granulocyte 6.3  2 - 6.9   Granulocyte percent 64.0  37 - 80 %G   RBC 4.20  4.04 - 5.48 M/uL   Hemoglobin 12.4  12.2 - 16.2 g/dL   HCT, POC 91.4  78.2 - 47.9 %   MCV 94.6  80 - 97 fL   MCH, POC 29.5  27 - 31.2 pg   MCHC 31.2 (*) 31.8 - 35.4 g/dL   RDW, POC 95.6     Platelet Count, POC 305  142 - 424 K/uL   MPV 9.3  0 - 99.8 fL     [facility] reading (PRIMARY) by  Dr. Milus Glazier,  KUB with FOS  Assessment:  Obstipation, acute and chronic.  Patient insists that she has not been constipated, having daily bowel movements. Nevertheless are seen  Stool and this could possibly represent a diverticular process.  Plan:  . RLQ abdominal pain - Plan: POCT CBC, DG Abd 1 View, metroNIDAZOLE (FLAGYL) 500 MG tablet  Diverticulitis of colon (without mention of hemorrhage)  Call INB in 24 -48 hrs.  Signed, [name], MD

## 2012-11-06 ENCOUNTER — Encounter (HOSPITAL_COMMUNITY)
Admission: RE | Disposition: A | Discharge status: Home / Self Care | Payer: Self-pay | Source: Ambulatory Visit | Attending: Gastroenterology

## 2012-11-06 ENCOUNTER — Encounter (HOSPITAL_COMMUNITY): Payer: Self-pay | Admitting: *Deleted

## 2012-11-06 ENCOUNTER — Ambulatory Visit (HOSPITAL_COMMUNITY)
Admission: RE | Admit: 2012-11-06 | Discharge: 2012-11-06 | Disposition: A | Discharge status: Home / Self Care | Payer: Medicare Other | Source: Ambulatory Visit | Attending: Gastroenterology | Admitting: Gastroenterology

## 2012-11-06 DIAGNOSIS — K219 Gastro-esophageal reflux disease without esophagitis: Secondary | ICD-10-CM | POA: Insufficient documentation

## 2012-11-06 DIAGNOSIS — K3184 Gastroparesis: Secondary | ICD-10-CM | POA: Insufficient documentation

## 2012-11-06 DIAGNOSIS — K3189 Other diseases of stomach and duodenum: Secondary | ICD-10-CM | POA: Diagnosis not present

## 2012-11-06 DIAGNOSIS — M1990 Unspecified osteoarthritis, unspecified site: Secondary | ICD-10-CM | POA: Diagnosis not present

## 2012-11-06 DIAGNOSIS — Z885 Allergy status to narcotic agent status: Secondary | ICD-10-CM | POA: Diagnosis not present

## 2012-11-06 DIAGNOSIS — Z888 Allergy status to other drugs, medicaments and biological substances status: Secondary | ICD-10-CM | POA: Insufficient documentation

## 2012-11-06 DIAGNOSIS — Z79899 Other long term (current) drug therapy: Secondary | ICD-10-CM | POA: Diagnosis not present

## 2012-11-06 DIAGNOSIS — IMO0002 Reserved for concepts with insufficient information to code with codable children: Secondary | ICD-10-CM

## 2012-11-06 DIAGNOSIS — IMO0001 Reserved for inherently not codable concepts without codable children: Secondary | ICD-10-CM | POA: Diagnosis not present

## 2012-11-06 DIAGNOSIS — K859 Acute pancreatitis without necrosis or infection, unspecified: Secondary | ICD-10-CM

## 2012-11-06 DIAGNOSIS — I10 Essential (primary) hypertension: Secondary | ICD-10-CM | POA: Insufficient documentation

## 2012-11-06 DIAGNOSIS — G4730 Sleep apnea, unspecified: Secondary | ICD-10-CM | POA: Diagnosis not present

## 2012-11-06 DIAGNOSIS — R1013 Epigastric pain: Secondary | ICD-10-CM | POA: Diagnosis not present

## 2012-11-06 DIAGNOSIS — G709 Myoneural disorder, unspecified: Secondary | ICD-10-CM | POA: Insufficient documentation

## 2012-11-06 DIAGNOSIS — D649 Anemia, unspecified: Secondary | ICD-10-CM | POA: Diagnosis not present

## 2012-11-06 DIAGNOSIS — R1011 Right upper quadrant pain: Secondary | ICD-10-CM | POA: Diagnosis not present

## 2012-11-06 HISTORY — PX: BOTOX INJECTION: SHX5754

## 2012-11-06 HISTORY — PX: ESOPHAGOGASTRODUODENOSCOPY: SHX5428

## 2012-11-06 SURGERY — EGD (ESOPHAGOGASTRODUODENOSCOPY)
Anesthesia: Moderate Sedation

## 2012-11-06 MED ORDER — DIPHENHYDRAMINE HCL 50 MG/ML IJ SOLN
INTRAMUSCULAR | Status: AC
  Filled 2012-11-06: qty 1

## 2012-11-06 MED ORDER — MIDAZOLAM HCL 10 MG/2ML IJ SOLN
INTRAMUSCULAR | Status: AC
  Filled 2012-11-06: qty 4

## 2012-11-06 MED ORDER — SODIUM CHLORIDE 0.9 % IV SOLN
INTRAVENOUS | Status: DC
  Administered 2012-11-06: 10:00:00 via INTRAVENOUS

## 2012-11-06 MED ORDER — BUTAMBEN-TETRACAINE-BENZOCAINE 2-2-14 % EX AERO
INHALATION_SPRAY | CUTANEOUS | Status: DC | PRN
  Administered 2012-11-06: 2 via TOPICAL

## 2012-11-06 MED ORDER — FENTANYL CITRATE 0.05 MG/ML IJ SOLN
INTRAMUSCULAR | Status: AC
  Filled 2012-11-06: qty 4

## 2012-11-06 MED ORDER — MIDAZOLAM HCL 10 MG/2ML IJ SOLN
INTRAMUSCULAR | Status: DC | PRN
  Administered 2012-11-06 (×3): 2.5 mg via INTRAVENOUS

## 2012-11-06 MED ORDER — ONABOTULINUMTOXINA 100 UNITS IJ SOLR
100.0000 [IU] | Freq: Once | INTRAMUSCULAR | Status: AC
  Administered 2012-11-06: 100 [IU] via INTRAMUSCULAR
  Filled 2012-11-06: qty 100

## 2012-11-06 MED ORDER — DIPHENHYDRAMINE HCL 50 MG/ML IJ SOLN
INTRAMUSCULAR | Status: DC | PRN
  Administered 2012-11-06 (×2): 25 mg via INTRAVENOUS

## 2012-11-06 MED ORDER — FENTANYL CITRATE 0.05 MG/ML IJ SOLN
INTRAMUSCULAR | Status: DC | PRN
  Administered 2012-11-06 (×3): 25 ug via INTRAVENOUS

## 2012-11-06 NOTE — Discharge Instructions (Signed)
Upper GI Endoscopy Upper GI endoscopy means using a flexible scope to look at the esophagus, stomach, and upper small bowel. This is done to make a diagnosis in people with heartburn, abdominal pain, or abnormal bleeding. Sometimes an endoscope is needed to remove foreign bodies or food that become stuck in the esophagus; it can also be used to take biopsy samples. For the best results, do not eat or drink for 8 hours before having your upper endoscopy.  To perform the endoscopy, you will probably be sedated and your throat will be numbed with a special spray. The endoscope is then slowly passed down your throat (this will not interfere with your breathing). An endoscopy exam takes 15 to 30 minutes to complete and there is no real pain. Patients rarely remember much about the procedure. The results of the test may take several days if a biopsy or other test is taken.  You may have a sore throat after an endoscopy exam. Serious complications are very rare. Stick to liquids and soft foods until your pain is better. Do not drive a car or operate any dangerous equipment for at least 24 hours after being sedated. SEEK IMMEDIATE MEDICAL CARE IF:   You have severe throat pain.   You have shortness of breath.   You have bleeding problems.   You have a fever.   You have difficulty recovering from your sedation.  Document Released: 07/06/2004 Document Revised: 05/18/2011 Document Reviewed: 05/31/2008 [employer] Patient Information 2012 [employer]. 

## 2012-11-06 NOTE — Op Note (Signed)
[facility] [address] [place], [address]   ENDOSCOPY PROCEDURE REPORT  PATIENT: Valerie [name], Valerie [name]  MR#: [id number] BIRTHDATE: 1961/03/25 , 51  yrs. old GENDER: Female ENDOSCOPIST:Marcea Rojek Madilyn Fireman, MD REFERRED BY: PROCEDURE DATE:  11/06/2012 PROCEDURE: ASA CLASS: INDICATIONS: MEDICATION: Fentanyl 100mg  10 versed 50 benadryl TOPICAL ANESTHETIC:  DESCRIPTION OF PROCEDURE:   normal esophagus, stomach and duodenum. 100 units botox injected in for 25 unit aliquots in 4 quadrants of pylorus     COMPLICATIONS: None  ENDOSCOPIC IMPRESSION:Normal exam sp botox injection  RECOMMENDATIONS:Resume zithromax-fu in office    _______________________________ Rosalie DoctorDorena Cookey, MD 11/06/2012 10:45 AM

## 2012-11-06 NOTE — Progress Notes (Signed)
Patient had injection of botox 100 units. Injection given in each of four quadrents around the stricure 25 units  In each  Injection .

## 2012-11-06 NOTE — H&P (Signed)
[facility] Admission History & Physical  Chief Complaint: abdominal pain nausea and vomiting HPI: Valerie [name] is an 52 y.o. white  female.  Who presents for botox injection of pylorus for gastroparesis which has been refractory to prokinetics  Past Medical History  Diagnosis Date  . Hypertension   . Anxiety   . Mental disorder   . Depression   . Sleep apnea   . Headache(784.0)   . Neuromuscular disorder   . 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 Fibromyalgia   . Arthritis     degenerative  . Anemia 05/01/2012  . Gastro-esophageal reflux   . Sleep apnea     CPAP-9  . Pancreatitis   . Diarrhea     EPISODIC  . Back pain     lower    Past Surgical History  Procedure Laterality Date  . Cholecystectomy    . Rotator cuff repair  bilateral  . Nasal septoplasty w/ turbinoplasty    . Esophagogastroduodenoscopy  12/21/2011    Procedure: ESOPHAGOGASTRODUODENOSCOPY (EGD);  Surgeon: Barrie Folk, MD;  Location: [facility] ENDOSCOPY;  Service: Endoscopy;  Laterality: N/A;  . Gallbladder surgery    . Flexible sigmoidoscopy  05/01/2012    Procedure: FLEXIBLE SIGMOIDOSCOPY;  Surgeon: Petra Kuba, MD;  Location: [facility] ENDOSCOPY;  Service: Endoscopy;  Laterality: N/A;  unprepped  . Biceps tendon repair      13  . Nasal sinus surgery      Medications Prior to Admission  Medication Sig Dispense Refill  . allopurinol (ZYLOPRIM) 300 MG tablet TAKE 1/2 TABLET AS DIRECTED  30 tablet  5  . ALPRAZolam (XANAX) 1 MG tablet Take 1 tablet (1 mg total) by mouth as needed.  30 tablet  0  . benazepril (LOTENSIN) 40 MG tablet Take 1 tablet (40 mg total) by mouth daily.  90 tablet  3  . fexofenadine-pseudoephedrine (ALLEGRA-D 24) 180-240 MG per 24 hr tablet Take 1 tablet by mouth daily.      . furosemide (LASIX) 20 MG tablet One every other day  30 tablet  3  . ibuprofen (ADVIL,MOTRIN) 600 MG tablet Take 600 mg by mouth as needed.      . lamoTRIgine (LAMICTAL) 100 MG tablet TAKE 1/2  TABLET BY MOUTH DAILY  30 tablet  3  . lipase/protease/amylase (CREON-10/PANCREASE) 12000 UNITS CPEP Take 3 capsules by mouth 3 (three) times daily before meals.      . metoprolol succinate (TOPROL-XL) 100 MG 24 hr tablet TAKE 1 TABLET BY MOUTH EACH DAY.Marland KitchenTAKE WITH OR IMMEDIATELY FOLLOWING AMEAL  30 tablet  3  . metroNIDAZOLE (FLAGYL) 500 MG tablet Take 1 tablet (500 mg total) by mouth 3 (three) times daily.  21 tablet  0  . modafinil (PROVIGIL) 200 MG tablet Take 1 tablet (200 mg total) by mouth daily.  30 tablet  3  . morphine (MS CONTIN) 30 MG 12 hr tablet Not from [facility]      . omeprazole (PRILOSEC) 40 MG capsule Take 40 mg by mouth 2 (two) times daily.      Marland Kitchen spironolactone (ALDACTONE) 100 MG tablet TAKE 1 TABLET BY MOUTH DAILY  30 tablet  3  . Vitamin D, Ergocalciferol, (DRISDOL) 50000 UNITS CAPS Take 50,000 Units by mouth.      Marland Kitchen albuterol (PROVENTIL HFA;VENTOLIN HFA) 108 (90 BASE) MCG/ACT inhaler Inhale 2 puffs into the lungs as needed.  6.7 g  6  . BLACK COHOSH PO Take 1 tablet  by mouth as needed.      Marland Kitchen HYDROCODONE-ACETAMINOPHEN PO Take 10-325 mg by mouth. One tablet daily as needed      . pseudoephedrine (SUDAFED) 30 MG/5ML syrup Take 30 mg by mouth 4 (four) times daily as needed.        Allergies:  Allergies  Allergen Reactions  . Dilaudid (Hydromorphone Hcl) Other (See Comments)    Headache   . Pregabalin   . Metoclopramide Hcl Other (See Comments)    "crawling inside"  . Neurontin (Gabapentin) Other (See Comments)    Tremors   . Other Other (See Comments)    Avoid all SSRI's and SNRI's- damaged pancreas  . Reglan (Metoclopramide)     Family History  Problem Relation Age of Onset  . Heart disease Mother   . COPD Mother   . Arthritis Mother   . Cancer Mother   . Hypertension Mother   . Arthritis Paternal Grandmother   . Heart disease Paternal Grandmother   . Heart disease Paternal Grandfather     Social History:  reports that she has never smoked. She has never used  smokeless tobacco. She reports that she does not drink alcohol or use illicit drugs.  Review of Systems: negative except as above   Blood pressure 132/71, pulse 68, temperature 98.2 F (36.8 C), temperature source Oral, resp. rate 22, SpO2 99.00%. Head: Normocephalic, without obvious abnormality, atraumatic Neck: no adenopathy, no carotid bruit, no JVD, supple, symmetrical, trachea midline and thyroid not enlarged, symmetric, no tenderness/mass/nodules Resp: clear to auscultation bilaterally Cardio: regular rate and rhythm, S1, S2 normal, no murmur, click, rub or gallop GI: abdomen soft, nontender Extremities: extremities normal, atraumatic, no cyanosis or edema  No results found for this or any previous visit (from the past 48 hour(s)). No results found.  Assessment: Gastroparesis  Plan:EGD with botox injection  Meya Clutter C 11/06/2012, 10:18 AM

## 2012-11-07 ENCOUNTER — Telehealth: Payer: Self-pay

## 2012-11-07 DIAGNOSIS — R109 Unspecified abdominal pain: Secondary | ICD-10-CM

## 2012-11-07 NOTE — Telephone Encounter (Signed)
Patient would like to talk to dr l regarding her left lower quandrant pain please call [phone]

## 2012-11-07 NOTE — Telephone Encounter (Signed)
She had a procedure yesterday, and states she did fine (botox to pyloris). She is still having right lower quadrant pain this has not improved with your current treatment of Flagyl. Please advise

## 2012-11-08 MED ORDER — HYOSCYAMINE SULFATE 0.125 MG SL SUBL: 0.1250 mg | SUBLINGUAL_TABLET | SUBLINGUAL | Status: DC | PRN

## 2012-11-08 NOTE — Telephone Encounter (Signed)
I called in some Levsin to stop the bowel spasm

## 2012-11-11 ENCOUNTER — Encounter (HOSPITAL_COMMUNITY): Payer: Self-pay | Admitting: Gastroenterology

## 2012-11-13 ENCOUNTER — Other Ambulatory Visit: Payer: Self-pay | Admitting: Gastroenterology

## 2012-11-13 DIAGNOSIS — K3184 Gastroparesis: Secondary | ICD-10-CM | POA: Diagnosis not present

## 2012-11-13 DIAGNOSIS — R1031 Right lower quadrant pain: Secondary | ICD-10-CM

## 2012-11-13 NOTE — Addendum Note (Signed)
Addended by: Dorena Cookey on: 11/13/2012 02:15 PM   Modules accepted: Orders

## 2012-11-13 NOTE — Telephone Encounter (Signed)
LMOM that Dr L had sent in another Rx to pharmacy, asked for CB w/any further ?s/concerns.

## 2012-11-13 NOTE — Telephone Encounter (Signed)
Pt called back and pt stated that she had not been aware that Rx was at pharmacy, but she didn't think a med for spasms would help because the pain is not in spasms. She stated that she went to see her GI, Dr. Madilyn Fireman, yesterday and he has ordered a CT for tomorrow d/t her pain worsening. Dr Elbert Ewings, [place]

## 2012-11-14 ENCOUNTER — Ambulatory Visit
Admission: RE | Admit: 2012-11-14 | Discharge: 2012-11-14 | Disposition: A | Discharge status: Home / Self Care | Payer: Medicare Other | Source: Ambulatory Visit | Attending: Gastroenterology | Admitting: Gastroenterology

## 2012-11-14 DIAGNOSIS — K5289 Other specified noninfective gastroenteritis and colitis: Secondary | ICD-10-CM | POA: Diagnosis not present

## 2012-11-14 DIAGNOSIS — R160 Hepatomegaly, not elsewhere classified: Secondary | ICD-10-CM | POA: Diagnosis not present

## 2012-11-14 DIAGNOSIS — R1031 Right lower quadrant pain: Secondary | ICD-10-CM

## 2012-11-14 MED ORDER — IOHEXOL 300 MG/ML  SOLN
125.0000 mL | Freq: Once | INTRAMUSCULAR | Status: AC | PRN
  Administered 2012-11-14: 125 mL via INTRAVENOUS

## 2012-11-26 DIAGNOSIS — K3189 Other diseases of stomach and duodenum: Secondary | ICD-10-CM | POA: Diagnosis not present

## 2012-11-26 DIAGNOSIS — R1013 Epigastric pain: Secondary | ICD-10-CM | POA: Diagnosis not present

## 2012-11-26 DIAGNOSIS — M545 Low back pain, unspecified: Secondary | ICD-10-CM | POA: Diagnosis not present

## 2012-11-26 DIAGNOSIS — H3582 Retinal ischemia: Secondary | ICD-10-CM | POA: Diagnosis not present

## 2012-11-26 DIAGNOSIS — Z9889 Other specified postprocedural states: Secondary | ICD-10-CM | POA: Diagnosis not present

## 2012-11-26 DIAGNOSIS — D485 Neoplasm of uncertain behavior of skin: Secondary | ICD-10-CM | POA: Diagnosis not present

## 2012-11-26 DIAGNOSIS — K859 Acute pancreatitis without necrosis or infection, unspecified: Secondary | ICD-10-CM | POA: Diagnosis not present

## 2012-11-26 DIAGNOSIS — Z79899 Other long term (current) drug therapy: Secondary | ICD-10-CM | POA: Diagnosis not present

## 2012-11-26 DIAGNOSIS — F329 Major depressive disorder, single episode, unspecified: Secondary | ICD-10-CM | POA: Diagnosis not present

## 2012-11-26 DIAGNOSIS — H2510 Age-related nuclear cataract, unspecified eye: Secondary | ICD-10-CM | POA: Diagnosis not present

## 2012-11-26 DIAGNOSIS — D239 Other benign neoplasm of skin, unspecified: Secondary | ICD-10-CM | POA: Diagnosis not present

## 2012-11-26 DIAGNOSIS — F4310 Post-traumatic stress disorder, unspecified: Secondary | ICD-10-CM | POA: Diagnosis not present

## 2012-11-26 DIAGNOSIS — R209 Unspecified disturbances of skin sensation: Secondary | ICD-10-CM | POA: Diagnosis not present

## 2012-11-26 DIAGNOSIS — K219 Gastro-esophageal reflux disease without esophagitis: Secondary | ICD-10-CM | POA: Diagnosis not present

## 2012-11-26 DIAGNOSIS — M461 Sacroiliitis, not elsewhere classified: Secondary | ICD-10-CM | POA: Diagnosis not present

## 2012-11-26 DIAGNOSIS — F3289 Other specified depressive episodes: Secondary | ICD-10-CM | POA: Diagnosis not present

## 2012-11-26 DIAGNOSIS — E669 Obesity, unspecified: Secondary | ICD-10-CM | POA: Diagnosis not present

## 2012-11-26 DIAGNOSIS — L819 Disorder of pigmentation, unspecified: Secondary | ICD-10-CM | POA: Diagnosis not present

## 2012-11-26 DIAGNOSIS — M79609 Pain in unspecified limb: Secondary | ICD-10-CM | POA: Diagnosis not present

## 2012-12-10 DIAGNOSIS — R1011 Right upper quadrant pain: Secondary | ICD-10-CM | POA: Diagnosis not present

## 2012-12-17 ENCOUNTER — Other Ambulatory Visit: Payer: Self-pay | Admitting: Gastroenterology

## 2012-12-17 DIAGNOSIS — R1031 Right lower quadrant pain: Secondary | ICD-10-CM | POA: Diagnosis not present

## 2012-12-17 DIAGNOSIS — K3184 Gastroparesis: Secondary | ICD-10-CM | POA: Diagnosis not present

## 2012-12-17 DIAGNOSIS — R109 Unspecified abdominal pain: Secondary | ICD-10-CM

## 2012-12-17 DIAGNOSIS — R1011 Right upper quadrant pain: Secondary | ICD-10-CM | POA: Diagnosis not present

## 2012-12-24 ENCOUNTER — Ambulatory Visit
Admission: RE | Admit: 2012-12-24 | Discharge: 2012-12-24 | Disposition: A | Discharge status: Home / Self Care | Payer: Medicare Other | Source: Ambulatory Visit | Attending: Gastroenterology | Admitting: Gastroenterology

## 2012-12-24 DIAGNOSIS — R109 Unspecified abdominal pain: Secondary | ICD-10-CM | POA: Diagnosis not present

## 2013-01-13 DIAGNOSIS — R1011 Right upper quadrant pain: Secondary | ICD-10-CM | POA: Diagnosis not present

## 2013-01-16 ENCOUNTER — Ambulatory Visit (INDEPENDENT_AMBULATORY_CARE_PROVIDER_SITE_OTHER): Payer: Self-pay | Admitting: Neurology

## 2013-01-16 DIAGNOSIS — G4733 Obstructive sleep apnea (adult) (pediatric): Secondary | ICD-10-CM

## 2013-01-16 DIAGNOSIS — Z9989 Dependence on other enabling machines and devices: Secondary | ICD-10-CM

## 2013-01-16 DIAGNOSIS — G4713 Recurrent hypersomnia: Secondary | ICD-10-CM

## 2013-01-16 DIAGNOSIS — J45909 Unspecified asthma, uncomplicated: Secondary | ICD-10-CM

## 2013-01-18 ENCOUNTER — Other Ambulatory Visit: Payer: Self-pay | Admitting: Family Medicine

## 2013-01-20 ENCOUNTER — Telehealth: Payer: Self-pay | Admitting: Neurology

## 2013-01-29 ENCOUNTER — Telehealth: Payer: Self-pay

## 2013-01-29 ENCOUNTER — Ambulatory Visit (INDEPENDENT_AMBULATORY_CARE_PROVIDER_SITE_OTHER): Payer: Medicare Other | Admitting: Family Medicine

## 2013-01-29 VITALS — BP 104/62 | HR 70 | Temp 98.8°F | Resp 18 | Ht 64.0 in | Wt 294.8 lb

## 2013-01-29 DIAGNOSIS — R7989 Other specified abnormal findings of blood chemistry: Secondary | ICD-10-CM | POA: Diagnosis not present

## 2013-01-29 DIAGNOSIS — R059 Cough, unspecified: Secondary | ICD-10-CM

## 2013-01-29 DIAGNOSIS — I10 Essential (primary) hypertension: Secondary | ICD-10-CM | POA: Diagnosis not present

## 2013-01-29 DIAGNOSIS — R112 Nausea with vomiting, unspecified: Secondary | ICD-10-CM

## 2013-01-29 DIAGNOSIS — F4310 Post-traumatic stress disorder, unspecified: Secondary | ICD-10-CM

## 2013-01-29 DIAGNOSIS — F411 Generalized anxiety disorder: Secondary | ICD-10-CM

## 2013-01-29 DIAGNOSIS — E559 Vitamin D deficiency, unspecified: Secondary | ICD-10-CM | POA: Diagnosis not present

## 2013-01-29 DIAGNOSIS — E790 Hyperuricemia without signs of inflammatory arthritis and tophaceous disease: Secondary | ICD-10-CM

## 2013-01-29 DIAGNOSIS — R05 Cough: Secondary | ICD-10-CM

## 2013-01-29 DIAGNOSIS — F419 Anxiety disorder, unspecified: Secondary | ICD-10-CM

## 2013-01-29 MED ORDER — BENAZEPRIL HCL 40 MG PO TABS: 40.0000 mg | ORAL_TABLET | Freq: Every day | ORAL | Status: DC

## 2013-01-29 MED ORDER — ALPRAZOLAM 1 MG PO TABS: 1.0000 mg | ORAL_TABLET | ORAL | Status: DC | PRN

## 2013-01-29 MED ORDER — SPIRONOLACTONE 100 MG PO TABS: 100.0000 mg | ORAL_TABLET | Freq: Every day | ORAL | Status: DC

## 2013-01-29 MED ORDER — ONDANSETRON HCL 8 MG PO TABS: 8.0000 mg | ORAL_TABLET | Freq: Three times a day (TID) | ORAL | Status: DC | PRN

## 2013-01-29 MED ORDER — AZITHROMYCIN 250 MG PO TABS: ORAL_TABLET | ORAL | Status: DC

## 2013-01-29 MED ORDER — METOPROLOL SUCCINATE ER 100 MG PO TB24: 100.0000 mg | ORAL_TABLET | Freq: Every day | ORAL | Status: DC

## 2013-01-29 MED ORDER — ALLOPURINOL 300 MG PO TABS: 150.0000 mg | ORAL_TABLET | Freq: Every day | ORAL | Status: DC

## 2013-01-29 NOTE — Progress Notes (Signed)
52 yo woman with hypertension needing refills.  She developed nausea and vomiting last Saturday, 5 days ago.  The problem persisted until late Sunday afternoon, some black emesis(no BRB).  She developed some dysphagia yesterday, and over last 24 hours has developed loose stools and aching.  No abdominal pain.  Still nauseated, not dizzy though.  Needs some papers refilled.  Objective:  NAD Chest:  Clear Heart: reg, no murmur Abdomen:  Soft, tender in midepigastrium, no mass or HSM Ext:  No edema, no postphlebitic changes. FMLA forms filled out Assessment:  Chronically depressed woman with recent gastroenteritis. Hypertension - Plan: spironolactone (ALDACTONE) 100 MG tablet, benazepril (LOTENSIN) 40 MG tablet, metoprolol succinate (TOPROL-XL) 100 MG 24 hr tablet  Hyperuricemia - Plan: allopurinol (ZYLOPRIM) 300 MG tablet  Nausea with vomiting - Plan: Comprehensive metabolic panel, POCT urinalysis dipstick, POCT UA - Microscopic Only, ondansetron (ZOFRAN) 8 MG tablet  Unspecified vitamin D deficiency  Anxiety - Plan: ALPRAZolam (XANAX) 1 MG tablet  PTSD (post-traumatic stress disorder) - Plan: ALPRAZolam (XANAX) 1 MG tablet  Cough - Plan: azithromycin (ZITHROMAX Z-PAK) 250 MG tablet  [name], MD

## 2013-01-29 NOTE — Addendum Note (Signed)
Addended by: Cira Rue R on: 01/29/2013 11:47 AM   Modules accepted: Orders

## 2013-01-29 NOTE — Patient Instructions (Signed)
Probiotic would be helpful.  Take a probiotic daily for 2 weeks to reestablish your normal gut flora.

## 2013-01-29 NOTE — Telephone Encounter (Signed)
Patient states that Dr. Milus Glazier put in an order for her to have her Vitamin D level checked. Patient wants to know when she is to come back and have that done.

## 2013-01-29 NOTE — Telephone Encounter (Signed)
Pt here today

## 2013-01-31 ENCOUNTER — Other Ambulatory Visit (INDEPENDENT_AMBULATORY_CARE_PROVIDER_SITE_OTHER): Payer: Medicare Other

## 2013-01-31 ENCOUNTER — Telehealth: Payer: Self-pay | Admitting: Family Medicine

## 2013-01-31 DIAGNOSIS — R059 Cough, unspecified: Secondary | ICD-10-CM | POA: Diagnosis not present

## 2013-01-31 DIAGNOSIS — R05 Cough: Secondary | ICD-10-CM

## 2013-01-31 DIAGNOSIS — I10 Essential (primary) hypertension: Secondary | ICD-10-CM

## 2013-01-31 DIAGNOSIS — E559 Vitamin D deficiency, unspecified: Secondary | ICD-10-CM | POA: Diagnosis not present

## 2013-01-31 DIAGNOSIS — R7989 Other specified abnormal findings of blood chemistry: Secondary | ICD-10-CM

## 2013-01-31 DIAGNOSIS — E790 Hyperuricemia without signs of inflammatory arthritis and tophaceous disease: Secondary | ICD-10-CM

## 2013-01-31 LAB — COMPREHENSIVE METABOLIC PANEL
ALT: <8 U/L (ref 0–35)
AST: 10 U/L (ref 0–37)
Albumin: 4.2 g/dL (ref 3.5–5.2)
Alkaline Phosphatase: 47 U/L (ref 39–117)
BUN: 25 mg/dL — ABNORMAL HIGH (ref 6–23)
CO2: 28 mEq/L (ref 19–32)
Calcium: 9.5 mg/dL (ref 8.4–10.5)
Chloride: 104 mEq/L (ref 96–112)
Creat: 1.68 mg/dL — ABNORMAL HIGH (ref 0.50–1.10)
Glucose, Bld: 92 mg/dL (ref 70–99)
Potassium: 4.8 mEq/L (ref 3.5–5.3)
Sodium: 137 mEq/L (ref 135–145)
Total Bilirubin: 0.2 mg/dL — ABNORMAL LOW (ref 0.3–1.2)
Total Protein: 7.1 g/dL (ref 6.0–8.3)

## 2013-01-31 LAB — POCT UA - MICROSCOPIC ONLY
Bacteria, U Microscopic: NEGATIVE
Casts, Ur, LPF, POC: NEGATIVE
Crystals, Ur, HPF, POC: NEGATIVE
Mucus, UA: NEGATIVE
RBC, urine, microscopic: NEGATIVE
WBC, Ur, HPF, POC: NEGATIVE
Yeast, UA: NEGATIVE

## 2013-01-31 LAB — POCT URINALYSIS DIPSTICK
Bilirubin, UA: NEGATIVE
Blood, UA: NEGATIVE
Glucose, UA: NEGATIVE
Ketones, UA: NEGATIVE
Leukocytes, UA: NEGATIVE
Nitrite, UA: NEGATIVE
Protein, UA: NEGATIVE
Spec Grav, UA: 1.015
Urobilinogen, UA: 0.2
pH, UA: 5.5

## 2013-01-31 NOTE — Telephone Encounter (Signed)
Patient states she is having nausea and real bad muscle aches in both legs. The nausea is not letting up at all. She has been trying Zofran and its not working.

## 2013-02-01 LAB — VITAMIN D 25 HYDROXY (VIT D DEFICIENCY, FRACTURES): Vit D, 25-Hydroxy: 27 ng/mL — ABNORMAL LOW (ref 30–89)

## 2013-02-01 NOTE — Telephone Encounter (Signed)
Recommend she come in for IV fluids and reevaluation today.

## 2013-02-03 ENCOUNTER — Telehealth: Payer: Self-pay

## 2013-02-03 ENCOUNTER — Ambulatory Visit (INDEPENDENT_AMBULATORY_CARE_PROVIDER_SITE_OTHER): Payer: Medicare Other | Admitting: Family Medicine

## 2013-02-03 ENCOUNTER — Encounter: Payer: Self-pay | Admitting: Family Medicine

## 2013-02-03 VITALS — BP 110/65 | HR 66 | Temp 98.6°F | Resp 18

## 2013-02-03 DIAGNOSIS — R110 Nausea: Secondary | ICD-10-CM | POA: Diagnosis not present

## 2013-02-03 LAB — POCT URINALYSIS DIPSTICK
Blood, UA: NEGATIVE
Glucose, UA: NEGATIVE
Ketones, UA: NEGATIVE
Spec Grav, UA: 1.02
Urobilinogen, UA: 0.2

## 2013-02-03 NOTE — Telephone Encounter (Signed)
Pt is calling back stating that when she came in to be seen she was told by Dr L that she would get IV fluids but however she was never given any IV fluids and she is not feeling any better Call back is [phone]

## 2013-02-03 NOTE — Telephone Encounter (Signed)
PT WAS CRYING AND STATED WE TOLD HER TO COME IN BUT SHE WANTS TO SPEAK WITH DR KURT WHEN HE COMES IN TODAY SINCE WE DON'T SEEM TO UNDERSTAND WHAT SHE IS TALKING ABOUT PLEASE CALL [phone]

## 2013-02-03 NOTE — Telephone Encounter (Signed)
Called her to advise she will come in for fluids  If it returns, she states she is a little better now.

## 2013-02-03 NOTE — Progress Notes (Signed)
[facility] [address], [place] [address] [phone]  Date:  02/03/2013   Name:  Valerie [name]   DOB:  11/18/1960   MRN:  [id number]  PCP:  [name], MD    Chief Complaint: Nausea and Leg Pain   History of Present Illness:  Valerie [name] is a 52 y.o. very pleasant female patient who presents with the following:  Here 5 days ago with nausea and vomiting for several days.  She was treated with zofran and azithromycin for her cough.  She called on the 22nd with complaint of continued nausea and aches in her legs.  Dr. Elbert Ewings responded on the 23rd and advised her to come in for recheck and possible IVF.  However, she did not receive a message until today- she called and was told to come in for IVF which she thought would help with her nausea and leg cramps.  However, she is no longer vomiting and in fact is hydrating very well orally at home.  She does continue to have nausea.   She is still using the zofran.   She does have some aches in her legs still.  She is "drinking gatorade like crazy" and is urinating a good amount.    She does take lasix every other day, but has not taken this in a week or two She has chronic pancreatitis, and "nerve damage in my arm and back" for which she takes pain medications.    Patient Active Problem List   Diagnosis Date Noted  . Obesity, morbid 10/21/2012  . OSA on CPAP 10/21/2012  . Fibromyalgia 06/22/2012  . Back pain 06/22/2012  . Recurrent pancreatitis 06/22/2012  . Depression 06/22/2012  . Anemia 05/01/2012  . Colitis 04/28/2012  . Lower GI bleed 04/28/2012  . Leukocytosis 04/28/2012  . Colon polyp 08/01/2011  . PTSD (post-traumatic stress disorder) 08/01/2011  . Hypertension 08/01/2011  . Obesity 08/01/2011  . IBS (irritable bowel syndrome) 08/01/2011    Past Medical History  Diagnosis Date  . Hypertension   . Anxiety   . Mental disorder   . Depression   . Sleep apnea   . Headache(784.0)   .  Neuromuscular disorder   . 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 Fibromyalgia   . Arthritis     degenerative  . Anemia 05/01/2012  . Gastro-esophageal reflux   . Sleep apnea     CPAP-9  . Pancreatitis   . Diarrhea     EPISODIC  . Back pain     lower    Past Surgical History  Procedure Laterality Date  . Cholecystectomy    . Rotator cuff repair  bilateral  . Nasal septoplasty w/ turbinoplasty    . Esophagogastroduodenoscopy  12/21/2011    Procedure: ESOPHAGOGASTRODUODENOSCOPY (EGD);  Surgeon: Barrie Folk, MD;  Location: [facility] ENDOSCOPY;  Service: Endoscopy;  Laterality: N/A;  . Gallbladder surgery    . Flexible sigmoidoscopy  05/01/2012    Procedure: FLEXIBLE SIGMOIDOSCOPY;  Surgeon: Petra Kuba, MD;  Location: [facility] ENDOSCOPY;  Service: Endoscopy;  Laterality: N/A;  unprepped  . Biceps tendon repair      13  . Nasal sinus surgery    . Esophagogastroduodenoscopy N/A 11/06/2012    Procedure: ESOPHAGOGASTRODUODENOSCOPY (EGD);  Surgeon: Barrie Folk, MD;  Location: [facility] ENDOSCOPY;  Service: Endoscopy;  Laterality: N/A;  . Botox injection N/A 11/06/2012    Procedure: BOTOX INJECTION;  Surgeon: Barrie Folk,  MD;  Location: [facility] ENDOSCOPY;  Service: Endoscopy;  Laterality: N/A;    History  Substance Use Topics  . Smoking status: Never Smoker   . Smokeless tobacco: Never Used  . Alcohol Use: No    Family History  Problem Relation Age of Onset  . Heart disease Mother   . COPD Mother   . Arthritis Mother   . Cancer Mother   . Hypertension Mother   . Arthritis Paternal Grandmother   . Heart disease Paternal Grandmother   . Heart disease Paternal Grandfather     Allergies  Allergen Reactions  . Dilaudid [Hydromorphone Hcl] Other (See Comments)    Headache   . Pregabalin   . Metoclopramide Hcl Other (See Comments)    "crawling inside"  . Neurontin [Gabapentin] Other (See Comments)    Tremors   . Other Other (See Comments)    Avoid all SSRI's and SNRI's- damaged pancreas   . Reglan [Metoclopramide]     Medication list has been reviewed and updated.  Current Outpatient Prescriptions on File Prior to Visit  Medication Sig Dispense Refill  . albuterol (PROVENTIL HFA;VENTOLIN HFA) 108 (90 BASE) MCG/ACT inhaler Inhale 2 puffs into the lungs as needed.  6.7 g  6  . allopurinol (ZYLOPRIM) 300 MG tablet Take 0.5 tablets (150 mg total) by mouth daily.  30 tablet  5  . ALPRAZolam (XANAX) 1 MG tablet Take 1 tablet (1 mg total) by mouth as needed.  30 tablet  3  . azithromycin (ZITHROMAX Z-PAK) 250 MG tablet Take as directed on pack  6 tablet  0  . benazepril (LOTENSIN) 40 MG tablet Take 1 tablet (40 mg total) by mouth daily.  90 tablet  3  . BLACK COHOSH PO Take 1 tablet by mouth as needed.      . furosemide (LASIX) 20 MG tablet One every other day  30 tablet  3  . HYDROCODONE-ACETAMINOPHEN PO Take 10-325 mg by mouth. One tablet daily as needed      . hyoscyamine (LEVSIN/SL) 0.125 MG SL tablet Place 1 tablet (0.125 mg total) under the tongue every 4 (four) hours as needed for cramping.  30 tablet  0  . ibuprofen (ADVIL,MOTRIN) 600 MG tablet Take 600 mg by mouth as needed.      . lamoTRIgine (LAMICTAL) 100 MG tablet TAKE 1/2 TABLET BY MOUTH DAILY  30 tablet  3  . lipase/protease/amylase (CREON-10/PANCREASE) 12000 UNITS CPEP Take 3 capsules by mouth 3 (three) times daily before meals.      . metoprolol succinate (TOPROL-XL) 100 MG 24 hr tablet Take 1 tablet (100 mg total) by mouth daily. PATIENT NEEDS OFFICE VISIT FOR ADDITIONAL REFILLS  30 tablet  0  . modafinil (PROVIGIL) 200 MG tablet Take 1 tablet (200 mg total) by mouth daily.  30 tablet  3  . morphine (MS CONTIN) 30 MG 12 hr tablet Not from [facility]      . omeprazole (PRILOSEC) 40 MG capsule Take 40 mg by mouth 2 (two) times daily.      . ondansetron (ZOFRAN) 8 MG tablet Take 1 tablet (8 mg total) by mouth every 8 (eight) hours as needed for nausea.  12 tablet  0  . pseudoephedrine (SUDAFED) 30 MG/5ML syrup Take 30 mg by  mouth 4 (four) times daily as needed.      Marland Kitchen spironolactone (ALDACTONE) 100 MG tablet Take 1 tablet (100 mg total) by mouth daily. PATIENT NEEDS OFFICE VISIT FOR ADDITIONAL REFILLS  90 tablet  3  .  fexofenadine-pseudoephedrine (ALLEGRA-D 24) 180-240 MG per 24 hr tablet Take 1 tablet by mouth daily.      . metroNIDAZOLE (FLAGYL) 500 MG tablet Take 1 tablet (500 mg total) by mouth 3 (three) times daily.  21 tablet  0  . Vitamin D, Ergocalciferol, (DRISDOL) 50000 UNITS CAPS Take 50,000 Units by mouth.       No current facility-administered medications on file prior to visit.    Review of Systems:  As per HPI- otherwise negative.   Physical Examination: Filed Vitals:   02/03/13 1258  BP: 110/65  Pulse: 66  Temp: 98.6 F (37 C)  Resp: 18   There were no vitals filed for this visit. There is no weight on file to calculate BMI. Ideal Body Weight:    GEN: WDWN, NAD, Non-toxic, A & O x 3, obese HEENT: Atraumatic, Normocephalic. Neck supple. No masses, No LAD. Ears and Nose: No external deformity. CV: RRR, No M/G/R. No JVD. No thrill. No extra heart sounds. PULM: CTA B, no wheezes, crackles, rhonchi. No retractions. No resp. distress. No accessory muscle use. ABD: S, NT, ND, +BS. No rebound. No HSM. EXTR: No c/c/e NEURO Normal gait.  PSYCH: Normally interactive. Conversant. Not depressed or anxious appearing.  Calm demeanor.   Discussed with pt in detail.  At this time her pulse is normal and she is hydrating well at home.  Her urine also does not suggest dehydration.  Therefore, IVF are not indicated at this time.  She had been under the impression that IVF would cure her nausea and leg cramps.  Explained that IVF are helpful when cramps are cuased by dehydration, but this does not seem to be the case at this point.  Also fluids will not cure her nausea but would only treat dehydration caused by nausea.  Recommended that we go ahead and recheck her electrolytes/ renal function, pancreatic  enzymes and CBC.  However, she became upset that "I was told to come here under false pretenses" and wished to leave clinic.    Results for orders placed in visit on 02/03/13  POCT URINALYSIS DIPSTICK      Result Value Range   Color, UA lt yellow     Clarity, UA clear     Glucose, UA neg     Bilirubin, UA neg     Ketones, UA neg     Spec Grav, UA 1.020     Blood, UA neg     pH, UA 5.5     Protein, UA neg     Urobilinogen, UA 0.2     Nitrite, UA neg     Leukocytes, UA Negative       Assessment and Plan: Nausea alone - Plan: POCT urinalysis dipstick  Zarinah came in today because she though she was supposed to receive fluids. However, her condition has changed since this advice was given and she is now hydrating very well orally.  Encouraged labs as above to follow her chronic pancreatitis, renal function and electrolytes to look for any other cause of persistent nausea and leg cramping.  She refused and left clinic.  Did not wish to have any further care.    Signed Abbe Amsterdam, MD

## 2013-02-04 NOTE — Telephone Encounter (Signed)
Patient came in to clinic yesterday after my phone call, but she was well hydrated and did not get the fluids, she is upset. Wants to speak to you only

## 2013-02-04 NOTE — Telephone Encounter (Signed)
Patient was seen by dr copland yesterday. See notes. Patient was improving with symptoms and hydrating well, was dr copland's decision patient did not need iv fluids. Patient also refused labs and wanted to leave clinic,  But wants to talk with Dr. Milus Glazier.

## 2013-02-09 ENCOUNTER — Other Ambulatory Visit: Payer: Self-pay | Admitting: Family Medicine

## 2013-02-18 ENCOUNTER — Ambulatory Visit (INDEPENDENT_AMBULATORY_CARE_PROVIDER_SITE_OTHER): Payer: Medicare Other | Admitting: Neurology

## 2013-02-18 DIAGNOSIS — G4733 Obstructive sleep apnea (adult) (pediatric): Secondary | ICD-10-CM | POA: Diagnosis not present

## 2013-02-18 DIAGNOSIS — G4737 Central sleep apnea in conditions classified elsewhere: Secondary | ICD-10-CM

## 2013-02-19 ENCOUNTER — Ambulatory Visit (INDEPENDENT_AMBULATORY_CARE_PROVIDER_SITE_OTHER): Payer: Medicare Other | Admitting: Family Medicine

## 2013-02-19 VITALS — BP 138/87 | HR 75 | Temp 99.0°F | Resp 16 | Ht 65.0 in | Wt 293.4 lb

## 2013-02-19 DIAGNOSIS — R350 Frequency of micturition: Secondary | ICD-10-CM | POA: Diagnosis not present

## 2013-02-19 DIAGNOSIS — R7989 Other specified abnormal findings of blood chemistry: Secondary | ICD-10-CM

## 2013-02-19 DIAGNOSIS — R1031 Right lower quadrant pain: Secondary | ICD-10-CM | POA: Diagnosis not present

## 2013-02-19 LAB — POCT UA - MICROSCOPIC ONLY
Casts, Ur, LPF, POC: NEGATIVE
Crystals, Ur, HPF, POC: NEGATIVE
Mucus, UA: NEGATIVE
Yeast, UA: NEGATIVE

## 2013-02-19 LAB — POCT CBC
Granulocyte percent: 70.9 %G (ref 37–80)
HCT, POC: 34.2 % — AB (ref 37.7–47.9)
Hemoglobin: 10.6 g/dL — AB (ref 12.2–16.2)
Lymph, poc: 1.5 (ref 0.6–3.4)
MCH, POC: 29.2 pg (ref 27–31.2)
MCHC: 31 g/dL — AB (ref 31.8–35.4)
MCV: 94.3 fL (ref 80–97)
MID (cbc): 0.3 (ref 0–0.9)
MPV: 8.8 fL (ref 0–99.8)
POC Granulocyte: 4.4 (ref 2–6.9)
POC LYMPH PERCENT: 23.9 %L (ref 10–50)
POC MID %: 5.2 %M (ref 0–12)
Platelet Count, POC: 255 10*3/uL (ref 142–424)
RBC: 3.63 M/uL — AB (ref 4.04–5.48)
RDW, POC: 14.6 %
WBC: 6.2 10*3/uL (ref 4.6–10.2)

## 2013-02-19 LAB — POCT URINALYSIS DIPSTICK
Bilirubin, UA: NEGATIVE
Blood, UA: NEGATIVE
Glucose, UA: NEGATIVE
Ketones, UA: NEGATIVE
Nitrite, UA: NEGATIVE
Protein, UA: NEGATIVE
Spec Grav, UA: 1.015
Urobilinogen, UA: 0.2
pH, UA: 5.5

## 2013-02-19 MED ORDER — NITROFURANTOIN MONOHYD MACRO 100 MG PO CAPS: 100.0000 mg | ORAL_CAPSULE | Freq: Two times a day (BID) | ORAL | Status: DC

## 2013-02-19 NOTE — Progress Notes (Signed)
52 yo who has suffered from fibromyalgia and episodic nausea.  She was doing okay until last Saturday when she developed urinary frequency and dysuria, associated with resumption of nausea and leg pain.  Really hasn't felt well in 3 weeks.  The vomiting has cleared, though, several weeks ago.  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  Objective:  NAD, appears discouraged and tearful HEENT:  Unremarkable with no icterus Chest:  Clear Heart:  Reg, no murmur, no gallop Abdomen:  Soft, mildly tender right side, no guarding, no rebound. Skin:  Clear  Spent 30 minutes counseling patient, discussing her feelings and difficulty getting healthcare. We also reviewed her medications which are quite numerous. Results for orders placed in visit on 02/19/13  POCT UA - MICROSCOPIC ONLY      Result Value Range   WBC, Ur, HPF, POC 1-3     RBC, urine, microscopic 0-1     Bacteria, U Microscopic trace     Mucus, UA neg     Epithelial cells, urine per micros 1-3     Crystals, Ur, HPF, POC neg     Casts, Ur, LPF, POC neg     Yeast, UA neg    POCT URINALYSIS DIPSTICK      Result Value Range   Color, UA yellow     Clarity, UA clear     Glucose, UA neg     Bilirubin, UA neg     Ketones, UA neg     Spec Grav, UA 1.015     Blood, UA neg     pH, UA 5.5     Protein, UA neg     Urobilinogen, UA 0.2     Nitrite, UA neg     Leukocytes, UA Trace    POCT CBC      Result Value Range   WBC 6.2  4.6 - 10.2 K/uL   Lymph, poc 1.5  0.6 - 3.4   POC LYMPH PERCENT 23.9  10 - 50 %L   MID (cbc) 0.3  0 - 0.9   POC MID % 5.2  0 - 12 %M   POC Granulocyte 4.4  2 - 6.9   Granulocyte percent 70.9  37 - 80 %G   RBC 3.63 (*) 4.04 - 5.48 M/uL   Hemoglobin 10.6 (*) 12.2 - 16.2 g/dL   HCT, POC 16.1 (*) 09.6 - 47.9 %   MCV 94.3  80 - 97 fL   MCH, POC 29.2  27 - 31.2 pg   MCHC 31.0 (*) 31.8 - 35.4 g/dL   RDW, POC 04.5     Platelet Count, POC 255  142 -  424 K/uL   MPV 8.8  0 - 99.8 fL   Assessment:  Mild anemia, vitamin D deficiency, early UTI  Plan:  multivit with iron Urinary frequency - Plan: POCT UA - Microscopic Only, POCT urinalysis dipstick, POCT CBC, nitrofurantoin, macrocrystal-monohydrate, (MACROBID) 100 MG capsule  Azotemia - Plan: Comprehensive metabolic panel  RLQ abdominal pain - Plan: POCT CBC  Signed, [name], MD

## 2013-02-20 LAB — COMPREHENSIVE METABOLIC PANEL
ALT: 13 U/L (ref 0–35)
AST: 14 U/L (ref 0–37)
Albumin: 4.3 g/dL (ref 3.5–5.2)
Alkaline Phosphatase: 55 U/L (ref 39–117)
BUN: 22 mg/dL (ref 6–23)
CO2: 25 mEq/L (ref 19–32)
Calcium: 9.6 mg/dL (ref 8.4–10.5)
Chloride: 104 mEq/L (ref 96–112)
Creat: 1.76 mg/dL — ABNORMAL HIGH (ref 0.50–1.10)
Glucose, Bld: 94 mg/dL (ref 70–99)
Potassium: 5.3 mEq/L (ref 3.5–5.3)
Sodium: 138 mEq/L (ref 135–145)
Total Bilirubin: 0.3 mg/dL (ref 0.3–1.2)
Total Protein: 7.1 g/dL (ref 6.0–8.3)

## 2013-02-26 ENCOUNTER — Other Ambulatory Visit: Payer: Self-pay | Admitting: *Deleted

## 2013-02-26 DIAGNOSIS — G4737 Central sleep apnea in conditions classified elsewhere: Secondary | ICD-10-CM

## 2013-02-26 DIAGNOSIS — G4733 Obstructive sleep apnea (adult) (pediatric): Secondary | ICD-10-CM

## 2013-02-28 ENCOUNTER — Other Ambulatory Visit: Payer: Self-pay | Admitting: Family Medicine

## 2013-03-01 ENCOUNTER — Ambulatory Visit (INDEPENDENT_AMBULATORY_CARE_PROVIDER_SITE_OTHER): Payer: Medicare Other | Admitting: Family Medicine

## 2013-03-01 VITALS — BP 128/72 | HR 92 | Temp 98.3°F | Resp 16 | Ht 65.0 in | Wt 286.0 lb

## 2013-03-01 DIAGNOSIS — R7989 Other specified abnormal findings of blood chemistry: Secondary | ICD-10-CM

## 2013-03-01 DIAGNOSIS — D649 Anemia, unspecified: Secondary | ICD-10-CM | POA: Diagnosis not present

## 2013-03-01 DIAGNOSIS — N390 Urinary tract infection, site not specified: Secondary | ICD-10-CM

## 2013-03-01 LAB — POCT UA - MICROSCOPIC ONLY
Bacteria, U Microscopic: NEGATIVE
Casts, Ur, LPF, POC: NEGATIVE
Crystals, Ur, HPF, POC: NEGATIVE
Epithelial cells, urine per micros: NEGATIVE
Mucus, UA: NEGATIVE
RBC, urine, microscopic: NEGATIVE
WBC, Ur, HPF, POC: NEGATIVE
Yeast, UA: NEGATIVE

## 2013-03-01 LAB — POCT URINALYSIS DIPSTICK
Bilirubin, UA: NEGATIVE
Blood, UA: NEGATIVE
Glucose, UA: NEGATIVE
Ketones, UA: NEGATIVE
Leukocytes, UA: NEGATIVE
Nitrite, UA: NEGATIVE
Protein, UA: NEGATIVE
Spec Grav, UA: 1.02
Urobilinogen, UA: 0.2
pH, UA: 6

## 2013-03-01 LAB — POCT CBC
Granulocyte percent: 54.6 %G (ref 37–80)
HCT, POC: 39.6 % (ref 37.7–47.9)
Hemoglobin: 12.3 g/dL (ref 12.2–16.2)
Lymph, poc: 3.4 (ref 0.6–3.4)
MCH, POC: 29.7 pg (ref 27–31.2)
MCHC: 31.1 g/dL — AB (ref 31.8–35.4)
MCV: 95.7 fL (ref 80–97)
MID (cbc): 0.5 (ref 0–0.9)
MPV: 9 fL (ref 0–99.8)
POC Granulocyte: 4.6 (ref 2–6.9)
POC LYMPH PERCENT: 39.6 %L (ref 10–50)
POC MID %: 5.8 %M (ref 0–12)
Platelet Count, POC: 304 10*3/uL (ref 142–424)
RBC: 4.14 M/uL (ref 4.04–5.48)
RDW, POC: 14.4 %
WBC: 8.5 10*3/uL (ref 4.6–10.2)

## 2013-03-01 LAB — COMPREHENSIVE METABOLIC PANEL
ALT: 15 U/L (ref 0–35)
AST: 14 U/L (ref 0–37)
Albumin: 4.5 g/dL (ref 3.5–5.2)
Alkaline Phosphatase: 53 U/L (ref 39–117)
BUN: 24 mg/dL — ABNORMAL HIGH (ref 6–23)
CO2: 28 mEq/L (ref 19–32)
Calcium: 10 mg/dL (ref 8.4–10.5)
Chloride: 102 mEq/L (ref 96–112)
Creat: 1.74 mg/dL — ABNORMAL HIGH (ref 0.50–1.10)
Glucose, Bld: 112 mg/dL — ABNORMAL HIGH (ref 70–99)
Potassium: 4.7 mEq/L (ref 3.5–5.3)
Sodium: 136 mEq/L (ref 135–145)
Total Bilirubin: 0.4 mg/dL (ref 0.3–1.2)
Total Protein: 7.6 g/dL (ref 6.0–8.3)

## 2013-03-01 NOTE — Progress Notes (Signed)
52 yo woman with recent urinary frequency which has cleared post antibiotic.  Has reconciled with preacher.  Overall:  Sleep problems with induction Having menopausal night sweats.  Has resumed the black cohosh. Fibromyalgia continues  Objective:  NAD Chest:  Clear Heart:  Reg, no murmur Neck: no adenop, supple  Results for orders placed in visit on 03/01/13  POCT CBC      Result Value Range   WBC 8.5  4.6 - 10.2 K/uL   Lymph, poc 3.4  0.6 - 3.4   POC LYMPH PERCENT 39.6  10 - 50 %L   MID (cbc) 0.5  0 - 0.9   POC MID % 5.8  0 - 12 %M   POC Granulocyte 4.6  2 - 6.9   Granulocyte percent 54.6  37 - 80 %G   RBC 4.14  4.04 - 5.48 M/uL   Hemoglobin 12.3  12.2 - 16.2 g/dL   HCT, POC 16.1  09.6 - 47.9 %   MCV 95.7  80 - 97 fL   MCH, POC 29.7  27 - 31.2 pg   MCHC 31.1 (*) 31.8 - 35.4 g/dL   RDW, POC 04.5     Platelet Count, POC 304  142 - 424 K/uL   MPV 9.0  0 - 99.8 fL  POCT URINALYSIS DIPSTICK      Result Value Range   Color, UA yellow     Clarity, UA clear     Glucose, UA neg     Bilirubin, UA neg     Ketones, UA neg     Spec Grav, UA 1.020     Blood, UA neg     pH, UA 6.0     Protein, UA neg     Urobilinogen, UA 0.2     Nitrite, UA neg     Leukocytes, UA Negative    POCT UA - MICROSCOPIC ONLY      Result Value Range   WBC, Ur, HPF, POC neg     RBC, urine, microscopic neg     Bacteria, U Microscopic neg     Mucus, UA neg     Epithelial cells, urine per micros neg     Crystals, Ur, HPF, POC neg     Casts, Ur, LPF, POC neg     Yeast, UA neg     Assessment: 52 year old with fibromyalgia and chronic depression who appears much better today. Labs have cleared.  Plan: Followup in one month  Anemia - Plan: POCT CBC  UTI (urinary tract infection) - Plan: POCT urinalysis dipstick, POCT UA - Microscopic Only  Azotemia - Plan: Comprehensive metabolic panel  Signed, [name], MD

## 2013-03-06 DIAGNOSIS — G4700 Insomnia, unspecified: Secondary | ICD-10-CM | POA: Diagnosis not present

## 2013-03-06 DIAGNOSIS — IMO0001 Reserved for inherently not codable concepts without codable children: Secondary | ICD-10-CM | POA: Diagnosis not present

## 2013-03-06 DIAGNOSIS — M25559 Pain in unspecified hip: Secondary | ICD-10-CM | POA: Diagnosis not present

## 2013-03-06 DIAGNOSIS — R5381 Other malaise: Secondary | ICD-10-CM | POA: Diagnosis not present

## 2013-03-09 ENCOUNTER — Telehealth: Payer: Self-pay | Admitting: *Deleted

## 2013-03-10 ENCOUNTER — Encounter: Payer: Self-pay | Admitting: *Deleted

## 2013-03-10 ENCOUNTER — Other Ambulatory Visit: Payer: Self-pay

## 2013-03-10 ENCOUNTER — Other Ambulatory Visit: Payer: Self-pay | Admitting: Neurology

## 2013-03-10 DIAGNOSIS — G4733 Obstructive sleep apnea (adult) (pediatric): Secondary | ICD-10-CM

## 2013-03-10 DIAGNOSIS — G4710 Hypersomnia, unspecified: Secondary | ICD-10-CM

## 2013-03-10 DIAGNOSIS — J45909 Unspecified asthma, uncomplicated: Secondary | ICD-10-CM

## 2013-03-10 DIAGNOSIS — Z9989 Dependence on other enabling machines and devices: Secondary | ICD-10-CM

## 2013-03-10 MED ORDER — ARMODAFINIL 250 MG PO TABS: 250.0000 mg | ORAL_TABLET | Freq: Every day | ORAL | Status: DC

## 2013-03-10 MED ORDER — MODAFINIL 200 MG PO TABS: 200.0000 mg | ORAL_TABLET | Freq: Every day | ORAL | Status: DC

## 2013-03-10 NOTE — Telephone Encounter (Signed)
Rx signed and faxed.

## 2013-03-13 DIAGNOSIS — Z23 Encounter for immunization: Secondary | ICD-10-CM | POA: Diagnosis not present

## 2013-03-14 NOTE — Telephone Encounter (Signed)
Called patient to discuss sleep study results.  Discussed findings, recommendations and follow up care.  Patient understood well and all questions were answered.   Orders were forwarded to [facility].  A copy of the sleep study interpretive report as well as a letter with info regarding contact info for the DME company, the importance of CPAP compliance, and the date of the follow up appointment info will be mailed to the patient's home. Follow up was scheduled for 04/15/13 at 1:30 pm

## 2013-03-24 ENCOUNTER — Encounter: Payer: Self-pay | Admitting: Neurology

## 2013-03-30 ENCOUNTER — Ambulatory Visit (INDEPENDENT_AMBULATORY_CARE_PROVIDER_SITE_OTHER): Payer: Medicare Other | Admitting: Family Medicine

## 2013-03-30 ENCOUNTER — Ambulatory Visit: Payer: Medicare Other

## 2013-03-30 VITALS — BP 118/60 | HR 90 | Temp 98.0°F | Resp 16 | Ht 64.0 in | Wt 287.0 lb

## 2013-03-30 DIAGNOSIS — R1031 Right lower quadrant pain: Secondary | ICD-10-CM

## 2013-03-30 DIAGNOSIS — N390 Urinary tract infection, site not specified: Secondary | ICD-10-CM | POA: Diagnosis not present

## 2013-03-30 DIAGNOSIS — R5381 Other malaise: Secondary | ICD-10-CM

## 2013-03-30 DIAGNOSIS — F329 Major depressive disorder, single episode, unspecified: Secondary | ICD-10-CM

## 2013-03-30 DIAGNOSIS — R8281 Pyuria: Secondary | ICD-10-CM

## 2013-03-30 DIAGNOSIS — F32A Depression, unspecified: Secondary | ICD-10-CM

## 2013-03-30 DIAGNOSIS — K5900 Constipation, unspecified: Secondary | ICD-10-CM

## 2013-03-30 LAB — POCT URINALYSIS DIPSTICK
Blood, UA: NEGATIVE
Glucose, UA: NEGATIVE
Leukocytes, UA: NEGATIVE
Nitrite, UA: NEGATIVE
Spec Grav, UA: 1.025
Urobilinogen, UA: 0.2
pH, UA: 5

## 2013-03-30 LAB — POCT CBC
Granulocyte percent: 67.7 %G (ref 37–80)
HCT, POC: 43.4 % (ref 37.7–47.9)
Hemoglobin: 13.6 g/dL (ref 12.2–16.2)
Lymph, poc: 3.6 — AB (ref 0.6–3.4)
MCH, POC: 29.6 pg (ref 27–31.2)
MCHC: 31.3 g/dL — AB (ref 31.8–35.4)
MCV: 94.6 fL (ref 80–97)
MID (cbc): 0.5 (ref 0–0.9)
MPV: 9.4 fL (ref 0–99.8)
POC Granulocyte: 8.5 — AB (ref 2–6.9)
POC LYMPH PERCENT: 28.7 %L (ref 10–50)
POC MID %: 3.6 %M (ref 0–12)
Platelet Count, POC: 383 10*3/uL (ref 142–424)
RBC: 4.59 M/uL (ref 4.04–5.48)
RDW, POC: 14.5 %
WBC: 12.6 10*3/uL — AB (ref 4.6–10.2)

## 2013-03-30 LAB — POCT UA - MICROSCOPIC ONLY
Casts, Ur, LPF, POC: NEGATIVE
Crystals, Ur, HPF, POC: NEGATIVE
Yeast, UA: NEGATIVE

## 2013-03-30 MED ORDER — CIPROFLOXACIN HCL 500 MG PO TABS: 500.0000 mg | ORAL_TABLET | Freq: Two times a day (BID) | ORAL | Status: DC

## 2013-03-30 NOTE — Progress Notes (Signed)
Subjective:  This chart was scribed for Valerie Sidle, MD by Carl Best, Medical Scribe. This patient was seen in Room 5 and the patient's care was started at 1:47 PM.   Patient ID: Valerie [name], female    DOB: 1960/11/08, 52 y.o.   MRN: [id number]  HPI HPI Comments: Valerie [name] is a 52 y.o. female with a history of hypertension who presents to the [facility] for a monthly checkup and intermittent pain to her groin radiating to her right flank.  She states that the pain was aggravated yesterday when she was sitting on her couch with her legs elevated.  She states that her bladder was full at the time of the incident.  She states that the pain had not bothered her for 5-6 days prior to the episode she had yesterday.  The patient denies any changes in her urination pattern, nausea, and fever as associated symptoms.  She states that she saw Dr. Arita Miss and was told to add the 50,000 units of Vitamin D.  She states she is still taking the multivitamin and 2,000 units of Vitamin D daily.  The patient has a history of a cholecystectomy.    The patient also needs a signature to update her handicap parking pass.   The patient states that she takes care of the soundboard at [facility] and will be up and down the stairs constantly.    The patient states that her results from her sleep study returned.  She states that she has to see Dr. Richardean Chimera in November.  She states that she is still falling asleep a lot.  She states that she was feeling better with the new pressure settings.    The patient's orthopedist is Dr. Linnell Fulling in [place], [place].    Past Medical History  Diagnosis Date  . Hypertension   . Anxiety   . Mental disorder   . Depression   . Sleep apnea   . Headache(784.0)   . Neuromuscular disorder   . 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 Fibromyalgia   . Arthritis     degenerative  . Anemia 05/01/2012  .  Gastro-esophageal reflux   . Sleep apnea     CPAP-9  . Pancreatitis   . Diarrhea     EPISODIC  . Back pain     lower   Past Surgical History  Procedure Laterality Date  . Cholecystectomy    . Rotator cuff repair  bilateral  . Nasal septoplasty w/ turbinoplasty    . Esophagogastroduodenoscopy  12/21/2011    Procedure: ESOPHAGOGASTRODUODENOSCOPY (EGD);  Surgeon: Barrie Folk, MD;  Location: [facility] ENDOSCOPY;  Service: Endoscopy;  Laterality: N/A;  . Gallbladder surgery    . Flexible sigmoidoscopy  05/01/2012    Procedure: FLEXIBLE SIGMOIDOSCOPY;  Surgeon: Petra Kuba, MD;  Location: [facility] ENDOSCOPY;  Service: Endoscopy;  Laterality: N/A;  unprepped  . Biceps tendon repair      13  . Nasal sinus surgery    . Esophagogastroduodenoscopy N/A 11/06/2012    Procedure: ESOPHAGOGASTRODUODENOSCOPY (EGD);  Surgeon: Barrie Folk, MD;  Location: [facility] ENDOSCOPY;  Service: Endoscopy;  Laterality: N/A;  . Botox injection N/A 11/06/2012    Procedure: BOTOX INJECTION;  Surgeon: Barrie Folk, MD;  Location: [facility] ENDOSCOPY;  Service: Endoscopy;  Laterality: N/A;   Family History  Problem Relation Age of Onset  . Heart disease Mother   . COPD Mother   .  Arthritis Mother   . Cancer Mother   . Hypertension Mother   . Arthritis Paternal Grandmother   . Heart disease Paternal Grandmother   . Heart disease Paternal Grandfather    History   Social History  . Marital Status: Single    Spouse Name: N/A    Number of Children: 0  . Years of Education: College   Occupational History  . disabled    Social History Main Topics  . Smoking status: Never Smoker   . Smokeless tobacco: Never Used  . Alcohol Use: No  . Drug Use: No  . Sexual Activity: Yes   Other Topics Concern  . Not on file   Social History Narrative  . No narrative on file   Allergies  Allergen Reactions  . Dilaudid [Hydromorphone Hcl] Other (See Comments)    Headache   . Pregabalin   . Metoclopramide Hcl Other (See Comments)     "crawling inside"  . Neurontin [Gabapentin] Other (See Comments)    Tremors   . Other Other (See Comments)    Avoid all SSRI's and SNRI's- damaged pancreas  . Reglan [Metoclopramide]    Current outpatient prescriptions:albuterol (PROVENTIL HFA;VENTOLIN HFA) 108 (90 BASE) MCG/ACT inhaler, Inhale 2 puffs into the lungs as needed., Disp: 6.7 g, Rfl: 6;  allopurinol (ZYLOPRIM) 300 MG tablet, Take 0.5 tablets (150 mg total) by mouth daily., Disp: 30 tablet, Rfl: 5;  ALPRAZolam (XANAX) 1 MG tablet, Take 1 tablet (1 mg total) by mouth as needed., Disp: 30 tablet, Rfl: 3 baclofen (LIORESAL) 10 MG tablet, Take 10 mg by mouth as needed., Disp: , Rfl: ;  benazepril (LOTENSIN) 40 MG tablet, Take 1 tablet (40 mg total) by mouth daily., Disp: 90 tablet, Rfl: 3;  cetirizine (ZYRTEC) 10 MG tablet, Take 10 mg by mouth daily., Disp: , Rfl: ;  furosemide (LASIX) 20 MG tablet, One every other day, Disp: 30 tablet, Rfl: 3;  HYDROCODONE-ACETAMINOPHEN PO, Take 10-325 mg by mouth. One tablet daily as needed, Disp: , Rfl:  ibuprofen (ADVIL,MOTRIN) 600 MG tablet, Take 600 mg by mouth as needed., Disp: , Rfl: ;  lamoTRIgine (LAMICTAL) 100 MG tablet, TAKE 1/2 TABLET BY MOUTH DAILY, Disp: 30 tablet, Rfl: 3;  lipase/protease/amylase (CREON-10/PANCREASE) 12000 UNITS CPEP, Take 3 capsules by mouth 3 (three) times daily before meals., Disp: , Rfl: ;  metoprolol succinate (TOPROL-XL) 100 MG 24 hr tablet, Take 1 tablet (100 mg total) by mouth daily., Disp: 30 tablet, Rfl: 2 modafinil (PROVIGIL) 200 MG tablet, Take 1 tablet (200 mg total) by mouth daily., Disp: 30 tablet, Rfl: 3;  morphine (MS CONTIN) 30 MG 12 hr tablet, Not from [facility], Disp: , Rfl: ;  Multiple Vitamin (MULTIVITAMIN) tablet, Take 1 tablet by mouth daily., Disp: , Rfl: ;  nitrofurantoin, macrocrystal-monohydrate, (MACROBID) 100 MG capsule, Take 1 capsule (100 mg total) by mouth 2 (two) times daily., Disp: 10 capsule, Rfl: 1 Armodafinil (NUVIGIL) 250 MG tablet, Take 1 tablet  (250 mg total) by mouth daily., Disp: 30 tablet, Rfl: 5;  omeprazole (PRILOSEC) 40 MG capsule, Take 40 mg by mouth 2 (two) times daily., Disp: , Rfl: ;  ondansetron (ZOFRAN) 8 MG tablet, Take 1 tablet (8 mg total) by mouth every 8 (eight) hours as needed for nausea., Disp: 12 tablet, Rfl: 0 spironolactone (ALDACTONE) 100 MG tablet, Take 1 tablet (100 mg total) by mouth daily. PATIENT NEEDS OFFICE VISIT FOR ADDITIONAL REFILLS, Disp: 90 tablet, Rfl: 3;  Vitamin D, Ergocalciferol, (DRISDOL) 50000 UNITS CAPS, Take 50,000  Units by mouth., Disp: , Rfl:    Filed Vitals:   03/30/13 1256  BP: 118/60  Pulse: 90  Temp: 98 F (36.7 C)  TempSrc: Oral  Resp: 16  Height: 5\' 4"  (1.626 m)  Weight: 287 lb (130.182 kg)  SpO2: 97%      Review of Systems  Constitutional: Negative for fever.  Gastrointestinal: Negative for nausea.  Genitourinary: Positive for flank pain (right). Negative for dysuria, urgency, decreased urine volume and difficulty urinating.  All other systems reviewed and are negative.    Objective:  Physical Exam  Nursing note and vitals reviewed. Constitutional: She is oriented to person, place, and time. She appears well-developed and well-nourished.  HENT:  Head: Normocephalic and atraumatic.  Eyes: Conjunctivae and EOM are normal. Pupils are equal, round, and reactive to light.  Neck: Normal range of motion. Neck supple.  Cardiovascular: Normal rate, regular rhythm and normal heart sounds.   Pulmonary/Chest: Effort normal and breath sounds normal.  Abdominal: Soft. Bowel sounds are normal.  Musculoskeletal: Normal range of motion.  Neurological: She is alert and oriented to person, place, and time.  Skin: Skin is warm and dry.  Psychiatric: She has a normal mood and affect.   Mild tenderness right abdomen  [facility] reading (PRIMARY) by  Dr. Milus Glazier:  KUB:  Stool in ascending colon.   DIAGNOSTIC STUDIES: Oxygen Saturation is 97% on room air, normal by my interpretation.     COORDINATION OF CARE: 1:53 PM- Discussed a clinical suspicion of a problem in the patient's urinary tract.  Discussed obtaining a KUB, CBC, and UA.  The patient agreed to the treatment plan.    Results for orders placed in visit on 03/30/13  POCT CBC      Result Value Range   WBC 12.6 (*) 4.6 - 10.2 K/uL   Lymph, poc 3.6 (*) 0.6 - 3.4   POC LYMPH PERCENT 28.7  10 - 50 %L   MID (cbc) 0.5  0 - 0.9   POC MID % 3.6  0 - 12 %M   POC Granulocyte 8.5 (*) 2 - 6.9   Granulocyte percent 67.7  37 - 80 %G   RBC 4.59  4.04 - 5.48 M/uL   Hemoglobin 13.6  12.2 - 16.2 g/dL   HCT, POC 45.4  09.8 - 47.9 %   MCV 94.6  80 - 97 fL   MCH, POC 29.6  27 - 31.2 pg   MCHC 31.3 (*) 31.8 - 35.4 g/dL   RDW, POC 11.9     Platelet Count, POC 383  142 - 424 K/uL   MPV 9.4  0 - 99.8 fL  POCT UA - MICROSCOPIC ONLY      Result Value Range   WBC, Ur, HPF, POC 7-tntc     RBC, urine, microscopic 1-2     Bacteria, U Microscopic 1+     Mucus, UA small     Epithelial cells, urine per micros 10-tntc     Crystals, Ur, HPF, POC neg     Casts, Ur, LPF, POC neg     Yeast, UA neg    POCT URINALYSIS DIPSTICK      Result Value Range   Color, UA yellow     Clarity, UA clear     Glucose, UA neg     Bilirubin, UA small     Ketones, UA trace     Spec Grav, UA 1.025     Blood, UA neg     pH,  UA 5.0     Protein, UA trace     Urobilinogen, UA 0.2     Nitrite, UA neg     Leukocytes, UA Negative          Assessment & Plan:   I personally performed the services described in this documentation, which was scribed in my presence. The recorded information has been reviewed and is accurate. RLQ abdominal pain - Plan: DG Abd 1 View, POCT CBC, POCT UA - Microscopic Only, POCT urinalysis dipstick, DG Abd 1 View, Urine culture  Depression  Other malaise and fatigue  Pyuria  Signed, Valerie Sidle, MD  Also, add miralax qd one capful in 8 oz water x 3 days

## 2013-03-31 LAB — URINE CULTURE
Colony Count: NO GROWTH
Organism ID, Bacteria: NO GROWTH

## 2013-04-09 ENCOUNTER — Telehealth: Payer: Self-pay

## 2013-04-09 NOTE — Telephone Encounter (Signed)
PT STATES SHE ISN'T ANY BETTER, WAS GIVEN AN ANTIBIOTIC AND NEED TO HAVE SOMETHING ELSE CALLED IN. PLEASE CALL [phone]    [name] TE[facility] IN [place]

## 2013-04-09 NOTE — Telephone Encounter (Signed)
Called her, what are her symptoms? She indicates she has bladder pain and pain over her right kidney, felt better on the cipro. She finished this Saturday and the pain has come back. The urine culture shows no growth. Please advise. She states if anything becomes severe she will return, or go to ER.

## 2013-04-10 NOTE — Telephone Encounter (Signed)
Please inquire if patient is having any fever or blood in urine.  Given the negative culture, we may need to do further investigation.  See if she can come back for more evaluation if the pain continues.

## 2013-04-10 NOTE — Telephone Encounter (Signed)
No fever, no hematuria. Patient advised Dr Milus Glazier wants her to return to clinic.

## 2013-04-13 ENCOUNTER — Ambulatory Visit (INDEPENDENT_AMBULATORY_CARE_PROVIDER_SITE_OTHER): Payer: Medicare Other | Admitting: Family Medicine

## 2013-04-13 VITALS — BP 132/86 | HR 84 | Temp 98.0°F | Resp 17 | Ht 65.0 in | Wt 291.0 lb

## 2013-04-13 DIAGNOSIS — R1031 Right lower quadrant pain: Secondary | ICD-10-CM | POA: Diagnosis not present

## 2013-04-13 DIAGNOSIS — R300 Dysuria: Secondary | ICD-10-CM | POA: Diagnosis not present

## 2013-04-13 LAB — POCT UA - MICROSCOPIC ONLY
Bacteria, U Microscopic: NEGATIVE
Casts, Ur, LPF, POC: NEGATIVE
Crystals, Ur, HPF, POC: NEGATIVE
RBC, urine, microscopic: NEGATIVE
Yeast, UA: NEGATIVE

## 2013-04-13 LAB — POCT URINALYSIS DIPSTICK
Bilirubin, UA: NEGATIVE
Blood, UA: NEGATIVE
Glucose, UA: NEGATIVE
Ketones, UA: NEGATIVE
Leukocytes, UA: NEGATIVE
Nitrite, UA: NEGATIVE
Protein, UA: NEGATIVE
Spec Grav, UA: 1.02
Urobilinogen, UA: 0.2
pH, UA: 5

## 2013-04-13 MED ORDER — BACLOFEN 10 MG PO TABS: 10.0000 mg | ORAL_TABLET | Freq: Three times a day (TID) | ORAL | Status: DC

## 2013-04-13 NOTE — Progress Notes (Signed)
This chart was scribed for [name], MD by Caryn Bee, Medical Scribe. This patient was seen in Room/bed 12 and the patient's care was started at 11:51 AM.  Subjective:    Patient ID: Valerie [name], female    DOB: 1960/12/30, 52 y.o.   MRN: [id number]  HPI HPI Comments: Valerie [name] is a 52 y.o. female who presents to [facility] for f/u for pain in her groin area. She reports finishing the course of antibiotics 04/06/13. Pt states that 04/09/13 the sharp RLQ abdominal pain returned. The pain radiates to her right side, right sided back, and groin. She reports that she was sitting on her couch when the pain began. Pt states that yesterday she was sitting at work and the pain worsened to the point that she had to go home. Pain is worsened when she is sitting up straight. Pt also reports dysuria. She states that she had a low grade fever earlier in the week.  Pt denies bowel issues.    Review of Systems  Constitutional: Positive for fever.  Gastrointestinal: Positive for abdominal pain.  Genitourinary: Positive for dysuria and flank pain.       No bowel issues.  Musculoskeletal: Positive for arthralgias (Right side ) and back pain.   Past Surgical History  Procedure Laterality Date  . Cholecystectomy    . Rotator cuff repair  bilateral  . Nasal septoplasty w/ turbinoplasty    . Esophagogastroduodenoscopy  12/21/2011    Procedure: ESOPHAGOGASTRODUODENOSCOPY (EGD);  Surgeon: Barrie Folk, MD;  Location: [facility] ENDOSCOPY;  Service: Endoscopy;  Laterality: N/A;  . Gallbladder surgery    . Flexible sigmoidoscopy  05/01/2012    Procedure: FLEXIBLE SIGMOIDOSCOPY;  Surgeon: Petra Kuba, MD;  Location: [facility] ENDOSCOPY;  Service: Endoscopy;  Laterality: N/A;  unprepped  . Biceps tendon repair      13  . Nasal sinus surgery    . Esophagogastroduodenoscopy N/A 11/06/2012    Procedure: ESOPHAGOGASTRODUODENOSCOPY (EGD);  Surgeon: Barrie Folk, MD;  Location: [facility] ENDOSCOPY;  Service: Endoscopy;   Laterality: N/A;  . Botox injection N/A 11/06/2012    Procedure: BOTOX INJECTION;  Surgeon: Barrie Folk, MD;  Location: [facility] ENDOSCOPY;  Service: Endoscopy;  Laterality: N/A;   History   Social History  . Marital Status: Single    Spouse Name: N/A    Number of Children: 0  . Years of Education: College   Occupational History  . disabled    Social History Main Topics  . Smoking status: Never Smoker   . Smokeless tobacco: Never Used  . Alcohol Use: No  . Drug Use: No  . Sexual Activity: Yes   Other Topics Concern  . Not on file   Social History Narrative  . No narrative on file   Past Medical History  Diagnosis Date  . Hypertension   . Anxiety   . Mental disorder   . Depression   . Sleep apnea   . Headache(784.0)   . Neuromuscular disorder   . 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 Fibromyalgia   . Arthritis     degenerative  . Anemia 05/01/2012  . Gastro-esophageal reflux   . Sleep apnea     CPAP-9  . Pancreatitis   . Diarrhea     EPISODIC  . Back pain     lower   Family History  Problem Relation Age of Onset  . Heart disease Mother   . COPD Mother   .  Arthritis Mother   . Cancer Mother   . Hypertension Mother   . Arthritis Paternal Grandmother   . Heart disease Paternal Grandmother   . Heart disease Paternal Grandfather    Allergies  Allergen Reactions  . Dilaudid [Hydromorphone Hcl] Other (See Comments)    Headache   . Pregabalin   . Metoclopramide Hcl Other (See Comments)    "crawling inside"  . Neurontin [Gabapentin] Other (See Comments)    Tremors   . Other Other (See Comments)    Avoid all SSRI's and SNRI's- damaged pancreas  . Reglan [Metoclopramide]         Objective:   Physical Exam  Nursing note and vitals reviewed. Constitutional: She is oriented to person, place, and time. She appears well-developed and well-nourished. No distress.  HENT:  Head: Atraumatic.  Cardiovascular: Normal rate.   Abdominal: Soft. There is  tenderness (RLQ).  Neurological: She is alert and oriented to person, place, and time.  Skin: Skin is warm. She is not diaphoretic.  Psychiatric: She has a normal mood and affect.   abdominal exam shows no mass or HSM or rebound or guarding. Results for orders placed in visit on 04/13/13  POCT UA - MICROSCOPIC ONLY      Result Value Range   WBC, Ur, HPF, POC 0-2     RBC, urine, microscopic NEG     Bacteria, U Microscopic NEG     Mucus, UA TRACE     Epithelial cells, urine per micros 0-6     Crystals, Ur, HPF, POC NEG     Casts, Ur, LPF, POC NEG     Yeast, UA NEG    POCT URINALYSIS DIPSTICK      Result Value Range   Color, UA YELLOW     Clarity, UA CLEAR     Glucose, UA NEG     Bilirubin, UA NEG     Ketones, UA NEG     Spec Grav, UA 1.020     Blood, UA NEG     pH, UA 5.0     Protein, UA NEG     Urobilinogen, UA 0.2     Nitrite, UA NEG     Leukocytes, UA Negative          Assessment & Plan:  The primary encounter diagnosis was Dysuria. A diagnosis of RLQ abdominal pain was also pertinent to this visit.  The cause of the symptoms is undetermined. It's possible she has some inflammation in the urinary tract although this is not showing up in the urine. Furthermore, there is no sign of a kidney stone. Possibilities include muscle strain.  Plan:  RLQ abdominal pain - Plan: Urine culture, baclofen (LIORESAL) 10 MG tablet, US Abdomen Complete  Dysuria - Plan: POCT UA - Microscopic Only, POCT urinalysis dipstick, Urine culture  Signed, [name], MD

## 2013-04-15 ENCOUNTER — Other Ambulatory Visit: Payer: Self-pay | Admitting: Radiology

## 2013-04-15 ENCOUNTER — Ambulatory Visit (INDEPENDENT_AMBULATORY_CARE_PROVIDER_SITE_OTHER): Payer: Medicare Other | Admitting: Neurology

## 2013-04-15 ENCOUNTER — Encounter: Payer: Self-pay | Admitting: Neurology

## 2013-04-15 ENCOUNTER — Telehealth: Payer: Self-pay

## 2013-04-15 VITALS — BP 102/63 | HR 63 | Resp 18 | Ht 65.0 in | Wt 290.0 lb

## 2013-04-15 DIAGNOSIS — G4733 Obstructive sleep apnea (adult) (pediatric): Secondary | ICD-10-CM | POA: Diagnosis not present

## 2013-04-15 DIAGNOSIS — G4730 Sleep apnea, unspecified: Secondary | ICD-10-CM

## 2013-04-15 LAB — URINE CULTURE
Colony Count: NO GROWTH
Organism ID, Bacteria: NO GROWTH

## 2013-04-15 MED ORDER — PHENAZOPYRIDINE HCL 100 MG PO TABS: 100.0000 mg | ORAL_TABLET | Freq: Three times a day (TID) | ORAL | Status: DC | PRN

## 2013-04-15 NOTE — Progress Notes (Signed)
[facility]  Provider:  Dr Dominie Benedick Referring Provider: [name], MD Primary Care Physician:  Lesly Dukes, MD  Chief Complaint  Patient presents with  . Sleep Apnea    F/U Visit  This patient's primary neurologist is Dr. Lesia Sago.    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 Milus Glazier for  OSA , diagnosed after a  sleep study on  12-22-2008 at [facility]. She underwent a SPLIT study 02-18-13  here at [facility] .   Patient has  being admitted to hospital for ischemic colitis in Nov 2013 , none since. The patient reports a history of snoring and daytime excessive sleepiness and was with the same complaints evaluated 4 years ago for  sleep apnea [facility]).The overall AHI was 46.4 , the patient had 8 obstructive apneas nor mixed apneas , 1 central apnea and 122 hypopneas.  Oxygen desaturations to a nadir of 64% . The patient was titrated to CPAP , received a recommendation to use CPAP at 9 cm water pressure.  The patient underwent a retitration here at [facility] lab  on 9-9 2014,  with Dr. Milus Glazier listed as the referring physician. She could not tolerate the CPAP settings that she had moved here with  from [place]  , [place] earlier that year .  At the time at [facility] she was titrated to 9 cm water- now for night use later re\re titration resulted in a pressure of 20 and an AHI of 20.6 RDI of 33.8 REM AHI of 21.2. There was no tachybradycardia arrhythmia and altered no prolonged desaturation altered, and no periodic limb movements seen. The patient was now actually titrated to 12 cm water she is now using a ResMed Mirage mask in a small size and uses heated humidity.  She drinks caffeinated teas , 2-3 glasses at lunch and dinner. Juice for breakfast . She still naps in daytime, often  right after taken modafinil.  Goes to bed around 11 PM, takes 30 minutes to 2 hours to  fall asleep.  CPAP has actually helped this a bit.  Has one bathroom break at night , sleeps usually to 10 AM- 6-7 hours total sleep time. Sleep hygiene needs still improvement.  Dreams seldomly, not frequently , not vivid.       ROS , feels more restored , rested after changes in settings, but Epworth still elevated and FSS , too..takes naps.  The patient endorsed the Epworth sleepiness score today , November.4th 2014 , at 13.5 points.  And fatigue severity at 58 points. She states that she is using Provigil with some success .  The medication helps , but she has taken a whole 200 mg tabs modafinil generic.     Last note : Dr. Anne Hahn is no 2 years ago the patient has morbid obesity, fibromyalgia, interstitial cystitis, hypertension, elevated uric acid levels, gastroesophageal reflux gastroparesis, sleep apnea, pancreatitis, the she has seen Dr. Anne Hahn for a left leg pain and lower back pain as well as right upper extremity discomfort in the past she also has degenerative disc disease, degenerative arthritis, status post sinus surgery and has asthma.   DME : [facility] but has not received a call back- he needs to get her new tubing, filter and  Mask.  [employer].            Current Outpatient Prescriptions  Medication Sig Dispense Refill  . allopurinol (ZYLOPRIM) 300 MG tablet Take 0.5 tablets (150 mg total) by mouth daily.  30 tablet  5  . ALPRAZolam (XANAX) 1 MG tablet Take 1 tablet (1 mg total) by mouth as needed.  30 tablet  3  . baclofen (LIORESAL) 10 MG tablet Take 10 mg by mouth. PRN      . benazepril (LOTENSIN) 40 MG tablet Take 1 tablet (40 mg total) by mouth daily.  90 tablet  3  . cetirizine (ZYRTEC) 10 MG tablet Take 10 mg by mouth daily.      . furosemide (LASIX) 20 MG tablet One every other day  30 tablet  3  . HYDROCODONE-ACETAMINOPHEN PO  Take 10-325 mg by mouth. One tablet daily as needed      . ibuprofen (ADVIL,MOTRIN) 600 MG tablet Take 600 mg by mouth as needed.      . lamoTRIgine (LAMICTAL) 100 MG tablet TAKE 1/2 TABLET BY MOUTH DAILY  30 tablet  3  . lipase/protease/amylase (CREON-10/PANCREASE) 12000 UNITS CPEP Take 3 capsules by mouth 3 (three) times daily before meals. 24,000 UNITS      . metoprolol succinate (TOPROL-XL) 100 MG 24 hr tablet Take 1 tablet (100 mg total) by mouth daily.  30 tablet  2  . modafinil (PROVIGIL) 200 MG tablet Take 1 tablet (200 mg total) by mouth daily.  30 tablet  3  . morphine (MS CONTIN) 30 MG 12 hr tablet Not from [facility]      . Multiple Vitamin (MULTIVITAMIN) tablet Take 1 tablet by mouth daily.      Marland Kitchen omeprazole (PRILOSEC) 40 MG capsule Take 40 mg by mouth 2 (two) times daily.      Marland Kitchen spironolactone (ALDACTONE) 100 MG tablet Take 1 tablet (100 mg total) by mouth daily. PATIENT NEEDS OFFICE VISIT FOR ADDITIONAL REFILLS  90 tablet  3  . Vitamin D, Ergocalciferol, (DRISDOL) 50000 UNITS CAPS Take 50,000 Units by mouth. 1 WK X'S 1 MO      . albuterol (PROVENTIL HFA;VENTOLIN HFA) 108 (90 BASE) MCG/ACT inhaler Inhale 2 puffs into the lungs as needed.  6.7 g  6  . ondansetron (ZOFRAN) 8 MG tablet Take 1 tablet (8 mg total) by mouth every 8 (eight) hours as needed for nausea.  12 tablet  0   No current facility-administered medications for this visit.    Allergies as of 04/15/2013 - Review Complete 04/15/2013  Allergen Reaction Noted  . Dilaudid [hydromorphone hcl] Other (See Comments) 08/01/2011  . Pregabalin  08/01/2011  . Metoclopramide hcl Other (See Comments) 08/01/2011  . Neurontin [gabapentin] Other (See Comments) 08/01/2011  . Other Other (See Comments) 08/01/2011  . Reglan [metoclopramide]  10/21/2012    Vitals: BP 102/63  Pulse 63  Resp 18  Ht 5\' 5"  (1.651 m)  Wt 290 lb (131.543 kg)  BMI 48.26 kg/m2 Last Weight:  Wt Readings from Last 1 Encounters:  04/15/13 290 lb (131.543 kg)    Last Height:   Ht Readings from Last 1 Encounters:  04/15/13 5\' 5"  (1.651 m)   Vision Screening:  Vitals   Physical exam:  General: The patient is awake, alert and appears not in acute distress. The patient is well groomed. Head: Normocephalic, atraumatic. Neck is supple. Mallampati 1, narrow laterally , but high , peaked palate - short airway , may have trouble with intubation.  neck circumference:18 inches . Cardiovascular:  Regular rate and rhythm, without  murmurs or carotid bruit, and without distended neck veins. Respiratory: Lungs are clear to auscultation. Skin:  Without evidence of edema, or rash Trunk: BMI is severely elevated , but  patient  has normal posture.  Neurologic exam : The patient is awake and alert, oriented to place and time.  Memory subjective  described as intact. There is a normal attention span & concentration ability.  Speech is fluent without dysarthria, dysphonia or aphasia. Mood and affect are appropriate.  Cranial nerves: Pupils are equal and briskly reactive to light. Funduscopic exam without evidence of pallor  (or edema). Extraocular movements  in vertical and horizontal planes intact and without nystagmus . Visual fields by finger perimetry are intact. Hearing to finger rub intact.   Facial sensation intact to fine touch. Facial motor strength is symmetric and tongue and uvula move midline.  Motor exam:   Normal tone and normal muscle bulk and symmetric normal strength in all extremities. ROM limitation in  Right hip,  Left and right shoulder.   Sensory:  Fine touch, pinprick and vibration were tested in all extremities. Proprioception is tested in the upper extremities only. This was normal.  Coordination: Rapid alternating movements in the fingers/hands is tested and normal.  Gait and station: Patient walks without assistive device and is able unassisted to climb up to the exam table.  Deep tendon reflexes: in the  upper and lower  extremities are symmetric and intact. Babinski maneuver response is downgoing.   Assessment:  After physical and neurologic examination, review of laboratory studies, imaging, neurophysiology testing and pre-existing records, assessment is that :  1) OSA with CPAP at 12 cm water. Large neck , BMI.   2) peripheral neuropathy, brachial plexus injury - seen by Dr. Anne Hahn. Not improved . 3) obesity, main OSA risk factor.  Agricultural engineer.   Plan:  Continue CPAP and obtain new supplies through your new DME - [facility] . For hose-tube, filter.  Bring CPAP to all appoinmtents.  Please note  weight reduction plan attached . visit 35 minutes.

## 2013-04-15 NOTE — Telephone Encounter (Signed)
Patient requesting the pyridium, advised her to get this OTC, she indicates she would like a Rx for this, it is cheaper. Please advise. She states she has IC and it is flaring on her.

## 2013-04-15 NOTE — Telephone Encounter (Signed)
PT C/O URETHRAL SPASMS, AND WOULD LIKE TO HAVE "PERIDIUM" CALLED IN FOR THIS BECAUSE SHE HAS BEEN PRESCRIBED THIS IN THE PAST. BEST# [phone]  PHARMACY: [name]

## 2013-04-15 NOTE — Telephone Encounter (Signed)
She can get this OTC left message for her to advise. She should only use this for 1-2 days, and should come in if symptoms persist beyond this.

## 2013-04-15 NOTE — Patient Instructions (Signed)
Daily Weight Record It is important to weigh yourself daily. Keep this daily weight chart near your scale. Weigh yourself each morning at the same time. Weigh yourself without shoes and with the same amount of clothes each day. Compare today's weight to yesterday's weight. Bring this form with you to your follow-up appointments. Call your caregiver if you gain 3 lb/1.4 kg in 1 day. Call your caregiver if you gain 5 lb/2.3 kg in a week. Date: ________ Weight: ____________________ Date: ________ Weight: ____________________ Date: ________ Weight: ____________________ Date: ________ Weight: ____________________ Date: ________ Weight: ____________________ Date: ________ Weight: ____________________ Date: ________ Weight: ____________________ Date: ________ Weight: ____________________ Date: ________ Weight: ____________________ Date: ________ Weight: ____________________ Date: ________ Weight: ____________________ Date: ________ Weight: ____________________ Date: ________ Weight: ____________________ Date: ________ Weight: ____________________ Date: ________ Weight: ____________________ Date: ________ Weight: ____________________ Date: ________ Weight: ____________________ Date: ________ Weight: ____________________ Date: ________ Weight: ____________________ Date: ________ Weight: ____________________ Date: ________ Weight: ____________________ Date: ________ Weight: ____________________ Date: ________ Weight: ____________________ Date: ________ Weight: ____________________ Date: ________ Weight: ____________________ Date: ________ Weight: ____________________ Date: ________ Weight: ____________________ Date: ________ Weight: ____________________ Date: ________ Weight: ____________________ Date: ________ Weight: ____________________ Date: ________ Weight: ____________________ Date: ________ Weight: ____________________ Date: ________ Weight: ____________________ Date: ________ Weight:  ____________________ Date: ________ Weight: ____________________ Date: ________ Weight: ____________________ Date: ________ Weight: ____________________ Date: ________ Weight: ____________________ Date: ________ Weight: ____________________ Date: ________ Weight: ____________________ Date: ________ Weight: ____________________ Date: ________ Weight: ____________________ Date: ________ Weight: ____________________ Date: ________ Weight: ____________________ Date: ________ Weight: ____________________ Date: ________ Weight: ____________________ Date: ________ Weight: ____________________ Date: ________ Weight: ____________________ Date: ________ Weight: ____________________ Date: ________ Weight: ____________________ Document Released: 08/10/2006 Document Revised: 08/21/2011 Document Reviewed: 05/17/2007 [employer] Patient Information 2014 [employer].  

## 2013-04-16 ENCOUNTER — Ambulatory Visit (HOSPITAL_COMMUNITY)
Admission: RE | Admit: 2013-04-16 | Discharge: 2013-04-16 | Disposition: A | Discharge status: Home / Self Care | Payer: Medicare Other | Source: Ambulatory Visit | Attending: Family Medicine | Admitting: Family Medicine

## 2013-04-16 ENCOUNTER — Telehealth: Payer: Self-pay

## 2013-04-16 DIAGNOSIS — E669 Obesity, unspecified: Secondary | ICD-10-CM | POA: Diagnosis not present

## 2013-04-16 DIAGNOSIS — R1031 Right lower quadrant pain: Secondary | ICD-10-CM | POA: Insufficient documentation

## 2013-04-16 DIAGNOSIS — I10 Essential (primary) hypertension: Secondary | ICD-10-CM | POA: Insufficient documentation

## 2013-04-16 DIAGNOSIS — R300 Dysuria: Secondary | ICD-10-CM

## 2013-04-16 NOTE — Telephone Encounter (Signed)
[facility] called to get clarification of test needed for pt. An [place] Abd was ordered but OV notes indicate the kidneys need to be looked at for more urinary Sxs , which would need a US Renal instead. I called Dr L who instr'd me to change order to US Renal. Put in new order for US Renal and notified [facility].

## 2013-04-17 ENCOUNTER — Other Ambulatory Visit: Payer: Self-pay

## 2013-04-18 ENCOUNTER — Telehealth: Payer: Self-pay | Admitting: Family Medicine

## 2013-04-18 DIAGNOSIS — R300 Dysuria: Secondary | ICD-10-CM

## 2013-04-18 MED ORDER — PHENAZOPYRIDINE HCL 100 MG PO TABS: 100.0000 mg | ORAL_TABLET | Freq: Three times a day (TID) | ORAL | Status: DC | PRN

## 2013-04-18 MED ORDER — DOXYCYCLINE HYCLATE 100 MG PO TABS: 100.0000 mg | ORAL_TABLET | Freq: Two times a day (BID) | ORAL | Status: DC

## 2013-04-18 NOTE — Telephone Encounter (Signed)
given negative u/s result Dysuria - Plan: doxycycline (VIBRA-TABS) 100 MG tablet, phenazopyridine (PYRIDIUM) 100 MG tablet

## 2013-04-30 DIAGNOSIS — M545 Low back pain, unspecified: Secondary | ICD-10-CM | POA: Diagnosis not present

## 2013-04-30 DIAGNOSIS — F4310 Post-traumatic stress disorder, unspecified: Secondary | ICD-10-CM | POA: Diagnosis not present

## 2013-04-30 DIAGNOSIS — G4733 Obstructive sleep apnea (adult) (pediatric): Secondary | ICD-10-CM | POA: Diagnosis not present

## 2013-04-30 DIAGNOSIS — M79609 Pain in unspecified limb: Secondary | ICD-10-CM | POA: Diagnosis not present

## 2013-04-30 DIAGNOSIS — M461 Sacroiliitis, not elsewhere classified: Secondary | ICD-10-CM | POA: Diagnosis not present

## 2013-04-30 DIAGNOSIS — E669 Obesity, unspecified: Secondary | ICD-10-CM | POA: Diagnosis not present

## 2013-05-23 DIAGNOSIS — K3184 Gastroparesis: Secondary | ICD-10-CM | POA: Diagnosis not present

## 2013-05-23 DIAGNOSIS — R1011 Right upper quadrant pain: Secondary | ICD-10-CM | POA: Diagnosis not present

## 2013-05-28 ENCOUNTER — Ambulatory Visit (INDEPENDENT_AMBULATORY_CARE_PROVIDER_SITE_OTHER): Payer: Medicare Other | Admitting: Family Medicine

## 2013-05-28 VITALS — BP 120/72 | HR 78 | Temp 98.6°F | Resp 18 | Ht 65.0 in | Wt 292.0 lb

## 2013-05-28 DIAGNOSIS — E559 Vitamin D deficiency, unspecified: Secondary | ICD-10-CM

## 2013-05-28 DIAGNOSIS — J309 Allergic rhinitis, unspecified: Secondary | ICD-10-CM | POA: Diagnosis not present

## 2013-05-28 DIAGNOSIS — H109 Unspecified conjunctivitis: Secondary | ICD-10-CM

## 2013-05-28 DIAGNOSIS — R7989 Other specified abnormal findings of blood chemistry: Secondary | ICD-10-CM

## 2013-05-28 MED ORDER — OLOPATADINE HCL 0.2 % OP SOLN: 1.0000 [drp] | Freq: Two times a day (BID) | OPHTHALMIC | Status: DC

## 2013-05-28 MED ORDER — MOMETASONE FUROATE 50 MCG/ACT NA SUSP: 2.0000 | Freq: Every day | NASAL | Status: DC

## 2013-05-28 MED ORDER — BECLOMETHASONE DIPROPIONATE 80 MCG/ACT NA AERS: 80.0000 ug | INHALATION_SPRAY | Freq: Two times a day (BID) | NASAL | Status: DC

## 2013-05-28 NOTE — Progress Notes (Signed)
Patient Name: Valerie [name] Date of Birth: 01-16-1961 Medical Record Number: [id number] Gender: female Date of Encounter: 05/28/2013  Chief Complaint: Follow-up   History of Present Illness:  Valerie [name] is a 52 y.o. very pleasant female patient who presents with the following:  Pt is seeking follow up from her visit on 04/13/13 today. She states her lower abdominal pain has resolved after taking her last round of antibiotic. She reports having a fever over 101 that occurred 1 week ago. She says the fever lasted for about 1 day. At time of onset states she tried OTC tylenol and ibuprofen with no relief, but says the fever eventually resolved on its own. She currently reports mild swelling in her ankles bilaterally, and she says this is fairly new for her.  Patient Active Problem List   Diagnosis Date Noted  . Sleep apnea with use of continuous positive airway pressure (CPAP) 04/15/2013  . Obesity, morbid 10/21/2012  . OSA on CPAP 10/21/2012  . Fibromyalgia 06/22/2012  . Back pain 06/22/2012  . Recurrent pancreatitis 06/22/2012  . Depression 06/22/2012  . Anemia 05/01/2012  . Colitis 04/28/2012  . Lower GI bleed 04/28/2012  . Leukocytosis 04/28/2012  . Colon polyp 08/01/2011  . PTSD (post-traumatic stress disorder) 08/01/2011  . Hypertension 08/01/2011  . Obesity 08/01/2011  . IBS (irritable bowel syndrome) 08/01/2011   Past Medical History  Diagnosis Date  . Hypertension   . Anxiety   . Mental disorder   . Depression   . Sleep apnea   . Headache(784.0)   . Neuromuscular disorder   . 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 Fibromyalgia   . Arthritis     degenerative  . Anemia 05/01/2012  . Gastro-esophageal reflux   . Sleep apnea     CPAP-9  . Pancreatitis   . Diarrhea     EPISODIC  . Back pain     lower   Past Surgical History  Procedure Laterality Date  . Cholecystectomy    . Rotator cuff repair  bilateral  . Nasal septoplasty w/  turbinoplasty    . Esophagogastroduodenoscopy  12/21/2011    Procedure: ESOPHAGOGASTRODUODENOSCOPY (EGD);  Surgeon: Barrie Folk, MD;  Location: [facility];  Service: Endoscopy;  Laterality: N/A;  . Gallbladder surgery    . Flexible sigmoidoscopy  05/01/2012    Procedure: FLEXIBLE SIGMOIDOSCOPY;  Surgeon: Petra Kuba, MD;  Location: [facility] ENDOSCOPY;  Service: Endoscopy;  Laterality: N/A;  unprepped  . Biceps tendon repair      13  . Nasal sinus surgery    . Esophagogastroduodenoscopy N/A 11/06/2012    Procedure: ESOPHAGOGASTRODUODENOSCOPY (EGD);  Surgeon: Barrie Folk, MD;  Location: [facility];  Service: Endoscopy;  Laterality: N/A;  . Botox injection N/A 11/06/2012    Procedure: BOTOX INJECTION;  Surgeon: Barrie Folk, MD;  Location: [facility] ENDOSCOPY;  Service: Endoscopy;  Laterality: N/A;   History  Substance Use Topics  . Smoking status: Never Smoker   . Smokeless tobacco: Never Used  . Alcohol Use: No   Family History  Problem Relation Age of Onset  . Heart disease Mother   . COPD Mother   . Arthritis Mother   . Cancer Mother   . Hypertension Mother   . Arthritis Paternal Grandmother   . Heart disease Paternal Grandmother   . Heart disease Paternal Grandfather    Allergies  Allergen Reactions  . Dilaudid [Hydromorphone Hcl] Other (See Comments)  Headache   . Pregabalin   . Metoclopramide Hcl Other (See Comments)    "crawling inside"  . Neurontin [Gabapentin] Other (See Comments)    Tremors   . Other Other (See Comments)    Avoid all SSRI's and SNRI's- damaged pancreas  . Reglan [Metoclopramide]     Medication list has been reviewed and updated.  Current Outpatient Prescriptions on File Prior to Visit  Medication Sig Dispense Refill  . albuterol (PROVENTIL HFA;VENTOLIN HFA) 108 (90 BASE) MCG/ACT inhaler Inhale 2 puffs into the lungs as needed.  6.7 g  6  . allopurinol (ZYLOPRIM) 300 MG tablet Take 0.5 tablets (150 mg total) by mouth daily.  30 tablet  5  .  ALPRAZolam (XANAX) 1 MG tablet Take 1 tablet (1 mg total) by mouth as needed.  30 tablet  3  . baclofen (LIORESAL) 10 MG tablet Take 10 mg by mouth. PRN      . benazepril (LOTENSIN) 40 MG tablet Take 1 tablet (40 mg total) by mouth daily.  90 tablet  3  . cetirizine (ZYRTEC) 10 MG tablet Take 10 mg by mouth daily.      Marland Kitchen doxycycline (VIBRA-TABS) 100 MG tablet Take 1 tablet (100 mg total) by mouth 2 (two) times daily.  20 tablet  0  . furosemide (LASIX) 20 MG tablet One every other day  30 tablet  3  . HYDROCODONE-ACETAMINOPHEN PO Take 10-325 mg by mouth. One tablet daily as needed      . ibuprofen (ADVIL,MOTRIN) 600 MG tablet Take 600 mg by mouth as needed.      . lamoTRIgine (LAMICTAL) 100 MG tablet TAKE 1/2 TABLET BY MOUTH DAILY  30 tablet  3  . lipase/protease/amylase (CREON-10/PANCREASE) 12000 UNITS CPEP Take 3 capsules by mouth 3 (three) times daily before meals. 24,000 UNITS      . metoprolol succinate (TOPROL-XL) 100 MG 24 hr tablet Take 1 tablet (100 mg total) by mouth daily.  30 tablet  2  . modafinil (PROVIGIL) 200 MG tablet Take 1 tablet (200 mg total) by mouth daily.  30 tablet  3  . morphine (MS CONTIN) 30 MG 12 hr tablet Not from [facility]      . Multiple Vitamin (MULTIVITAMIN) tablet Take 1 tablet by mouth daily.      Marland Kitchen omeprazole (PRILOSEC) 40 MG capsule Take 40 mg by mouth 2 (two) times daily.      . ondansetron (ZOFRAN) 8 MG tablet Take 1 tablet (8 mg total) by mouth every 8 (eight) hours as needed for nausea.  12 tablet  0  . phenazopyridine (PYRIDIUM) 100 MG tablet Take 1 tablet (100 mg total) by mouth 3 (three) times daily as needed for pain.  10 tablet  0  . spironolactone (ALDACTONE) 100 MG tablet Take 1 tablet (100 mg total) by mouth daily. PATIENT NEEDS OFFICE VISIT FOR ADDITIONAL REFILLS  90 tablet  3  . Vitamin D, Ergocalciferol, (DRISDOL) 50000 UNITS CAPS Take 50,000 Units by mouth. 1 WK X'S 1 MO       No current facility-administered medications on file prior to visit.     Review of Systems:   Physical Examination: Filed Vitals:   05/28/13 1622  BP: 120/72  Pulse: 78  Temp: 98.6 F (37 C)  Resp: 18   @vitals2 @ Body mass index is 48.59 kg/(m^2). Ideal Body Weight: @FLOWAMB ([id number])@  Spent 20 minutes talking patient face-to-face about her conflicts with her junior Education officer, environmental. Apparently he has not respected her and she is  not planning to go to church on Christmas. She did saying at the annual Christmas pageant earlier this month.  EKG / Labs / Xrays: None available at time of encounter  Assessment and Plan: Recheck one month  Unspecified vitamin D deficiency  Azotemia - Plan: Comprehensive metabolic panel  Vitamin D deficiency - Plan: Vitamin D, 25-hydroxy  Conjunctivitis - Plan: Olopatadine HCl (PATADAY) 0.2 % SOLN  Allergic rhinitis - Plan: mometasone (NASONEX) 50 MCG/ACT nasal spray  Signed, [name], MD

## 2013-05-29 LAB — COMPREHENSIVE METABOLIC PANEL
ALT: 11 U/L (ref 0–35)
AST: 12 U/L (ref 0–37)
Albumin: 4.5 g/dL (ref 3.5–5.2)
Alkaline Phosphatase: 55 U/L (ref 39–117)
BUN: 22 mg/dL (ref 6–23)
CO2: 28 mEq/L (ref 19–32)
Calcium: 9.6 mg/dL (ref 8.4–10.5)
Chloride: 104 mEq/L (ref 96–112)
Creat: 1.18 mg/dL — ABNORMAL HIGH (ref 0.50–1.10)
Glucose, Bld: 93 mg/dL (ref 70–99)
Potassium: 5.3 mEq/L (ref 3.5–5.3)
Sodium: 140 mEq/L (ref 135–145)
Total Bilirubin: 0.2 mg/dL — ABNORMAL LOW (ref 0.3–1.2)
Total Protein: 7.3 g/dL (ref 6.0–8.3)

## 2013-05-30 LAB — VITAMIN D 25 HYDROXY (VIT D DEFICIENCY, FRACTURES): Vit D, 25-Hydroxy: 63 ng/mL (ref 30–89)

## 2013-06-19 ENCOUNTER — Other Ambulatory Visit: Payer: Self-pay | Admitting: Physician Assistant

## 2013-06-19 ENCOUNTER — Other Ambulatory Visit: Payer: Self-pay | Admitting: Family Medicine

## 2013-06-20 NOTE — Telephone Encounter (Signed)
PT NEEDS APPT FOR FURTHER REFILLS 

## 2013-07-02 ENCOUNTER — Other Ambulatory Visit: Payer: Self-pay

## 2013-07-02 DIAGNOSIS — G4733 Obstructive sleep apnea (adult) (pediatric): Secondary | ICD-10-CM

## 2013-07-02 DIAGNOSIS — G4710 Hypersomnia, unspecified: Secondary | ICD-10-CM

## 2013-07-02 DIAGNOSIS — J45909 Unspecified asthma, uncomplicated: Secondary | ICD-10-CM

## 2013-07-02 DIAGNOSIS — G4730 Sleep apnea, unspecified: Secondary | ICD-10-CM

## 2013-07-02 DIAGNOSIS — Z9989 Dependence on other enabling machines and devices: Secondary | ICD-10-CM

## 2013-07-02 MED ORDER — MODAFINIL 200 MG PO TABS: 200.0000 mg | ORAL_TABLET | Freq: Every day | ORAL | Status: DC

## 2013-07-03 NOTE — Telephone Encounter (Signed)
Rx signed and faxed.

## 2013-07-20 ENCOUNTER — Other Ambulatory Visit: Payer: Self-pay | Admitting: Family Medicine

## 2013-07-21 ENCOUNTER — Other Ambulatory Visit: Payer: Self-pay | Admitting: Physician Assistant

## 2013-07-21 NOTE — Telephone Encounter (Signed)
Dr L, you have seen pt several times recently but not for edema since 08/2012 that I can see. Do you want to RF or need pt to RTC?

## 2013-07-22 ENCOUNTER — Other Ambulatory Visit: Payer: Self-pay | Admitting: Physician Assistant

## 2013-07-23 NOTE — Telephone Encounter (Signed)
RTC will be best.

## 2013-07-24 ENCOUNTER — Other Ambulatory Visit: Payer: Self-pay | Admitting: Family Medicine

## 2013-07-25 MED ORDER — METOPROLOL SUCCINATE ER 100 MG PO TB24: 100.0000 mg | ORAL_TABLET | Freq: Every day | ORAL | Status: DC

## 2013-07-28 DIAGNOSIS — F4310 Post-traumatic stress disorder, unspecified: Secondary | ICD-10-CM | POA: Diagnosis not present

## 2013-07-28 DIAGNOSIS — K3189 Other diseases of stomach and duodenum: Secondary | ICD-10-CM | POA: Diagnosis not present

## 2013-07-28 DIAGNOSIS — M79609 Pain in unspecified limb: Secondary | ICD-10-CM | POA: Diagnosis not present

## 2013-07-28 DIAGNOSIS — G4733 Obstructive sleep apnea (adult) (pediatric): Secondary | ICD-10-CM | POA: Diagnosis not present

## 2013-07-28 DIAGNOSIS — K859 Acute pancreatitis without necrosis or infection, unspecified: Secondary | ICD-10-CM | POA: Diagnosis not present

## 2013-07-28 DIAGNOSIS — F3289 Other specified depressive episodes: Secondary | ICD-10-CM | POA: Diagnosis not present

## 2013-07-28 DIAGNOSIS — F329 Major depressive disorder, single episode, unspecified: Secondary | ICD-10-CM | POA: Diagnosis not present

## 2013-07-28 DIAGNOSIS — E669 Obesity, unspecified: Secondary | ICD-10-CM | POA: Diagnosis not present

## 2013-07-28 DIAGNOSIS — R1013 Epigastric pain: Secondary | ICD-10-CM | POA: Diagnosis not present

## 2013-07-28 DIAGNOSIS — M461 Sacroiliitis, not elsewhere classified: Secondary | ICD-10-CM | POA: Diagnosis not present

## 2013-07-28 DIAGNOSIS — K219 Gastro-esophageal reflux disease without esophagitis: Secondary | ICD-10-CM | POA: Diagnosis not present

## 2013-07-28 DIAGNOSIS — M545 Low back pain, unspecified: Secondary | ICD-10-CM | POA: Diagnosis not present

## 2013-07-28 DIAGNOSIS — Z79899 Other long term (current) drug therapy: Secondary | ICD-10-CM | POA: Diagnosis not present

## 2013-08-18 ENCOUNTER — Other Ambulatory Visit: Payer: Self-pay | Admitting: Physician Assistant

## 2013-08-19 ENCOUNTER — Other Ambulatory Visit: Payer: Self-pay | Admitting: Physician Assistant

## 2013-08-19 ENCOUNTER — Telehealth: Payer: Self-pay

## 2013-08-19 NOTE — Telephone Encounter (Signed)
Patient of Dr L. Says both of her rx's were denied from [facility] and doesn't know why because she was seen in December and given 11 refills. Cb# [phone]

## 2013-08-20 NOTE — Telephone Encounter (Signed)
Spoke to pt- she will be coming in to see Dr. Joseph Art in the next few weeks. Per last ov and labwork pt had done in December pt is ok to have 6 month refill from that date. Pt advise refills sent to pharmacy.

## 2013-10-08 ENCOUNTER — Ambulatory Visit (INDEPENDENT_AMBULATORY_CARE_PROVIDER_SITE_OTHER): Payer: Medicare Other | Admitting: Emergency Medicine

## 2013-10-08 VITALS — BP 131/85 | HR 83 | Temp 98.6°F | Resp 16 | Ht 63.5 in | Wt 301.6 lb

## 2013-10-08 DIAGNOSIS — I10 Essential (primary) hypertension: Secondary | ICD-10-CM | POA: Diagnosis not present

## 2013-10-08 DIAGNOSIS — Z760 Encounter for issue of repeat prescription: Secondary | ICD-10-CM | POA: Diagnosis not present

## 2013-10-08 DIAGNOSIS — F4310 Post-traumatic stress disorder, unspecified: Secondary | ICD-10-CM

## 2013-10-08 DIAGNOSIS — F419 Anxiety disorder, unspecified: Secondary | ICD-10-CM

## 2013-10-08 DIAGNOSIS — F411 Generalized anxiety disorder: Secondary | ICD-10-CM

## 2013-10-08 DIAGNOSIS — M7989 Other specified soft tissue disorders: Secondary | ICD-10-CM

## 2013-10-08 DIAGNOSIS — E8779 Other fluid overload: Secondary | ICD-10-CM | POA: Diagnosis not present

## 2013-10-08 DIAGNOSIS — R609 Edema, unspecified: Secondary | ICD-10-CM

## 2013-10-08 LAB — COMPREHENSIVE METABOLIC PANEL
ALK PHOS: 48 U/L (ref 39–117)
ALT: 23 U/L (ref 0–35)
AST: 17 U/L (ref 0–37)
Albumin: 4.4 g/dL (ref 3.5–5.2)
BILIRUBIN TOTAL: 0.4 mg/dL (ref 0.2–1.2)
BUN: 18 mg/dL (ref 6–23)
CO2: 29 mEq/L (ref 19–32)
Calcium: 9.9 mg/dL (ref 8.4–10.5)
Chloride: 100 mEq/L (ref 96–112)
Creat: 1.39 mg/dL — ABNORMAL HIGH (ref 0.50–1.10)
Glucose, Bld: 129 mg/dL — ABNORMAL HIGH (ref 70–99)
Potassium: 4.7 mEq/L (ref 3.5–5.3)
SODIUM: 136 meq/L (ref 135–145)
TOTAL PROTEIN: 7.4 g/dL (ref 6.0–8.3)

## 2013-10-08 LAB — POCT URINALYSIS DIPSTICK
Bilirubin, UA: NEGATIVE
Blood, UA: NEGATIVE
Glucose, UA: NEGATIVE
Ketones, UA: NEGATIVE
Leukocytes, UA: NEGATIVE
Nitrite, UA: NEGATIVE
Protein, UA: NEGATIVE
Spec Grav, UA: 1.015
UROBILINOGEN UA: 0.2
pH, UA: 6.5

## 2013-10-08 LAB — POCT CBC
Granulocyte percent: 60.3 %G (ref 37–80)
HCT, POC: 40.8 % (ref 37.7–47.9)
Hemoglobin: 13 g/dL (ref 12.2–16.2)
LYMPH, POC: 2.7 (ref 0.6–3.4)
MCH: 29.6 pg (ref 27–31.2)
MCHC: 31.9 g/dL (ref 31.8–35.4)
MCV: 93 fL (ref 80–97)
MID (CBC): 0.4 (ref 0–0.9)
MPV: 9.1 fL (ref 0–99.8)
PLATELET COUNT, POC: 332 10*3/uL (ref 142–424)
POC Granulocyte: 4.6 (ref 2–6.9)
POC LYMPH %: 34.6 % (ref 10–50)
POC MID %: 5.1 % (ref 0–12)
RBC: 4.39 M/uL (ref 4.04–5.48)
RDW, POC: 14 %
WBC: 7.7 10*3/uL (ref 4.6–10.2)

## 2013-10-08 LAB — POCT UA - MICROSCOPIC ONLY
Bacteria, U Microscopic: NEGATIVE
CASTS, UR, LPF, POC: NEGATIVE
Crystals, Ur, HPF, POC: NEGATIVE
Mucus, UA: NEGATIVE
WBC, Ur, HPF, POC: NEGATIVE
YEAST UA: NEGATIVE

## 2013-10-08 LAB — URIC ACID: Uric Acid, Serum: 6.2 mg/dL (ref 2.4–7.0)

## 2013-10-08 MED ORDER — METOPROLOL SUCCINATE ER 100 MG PO TB24: 100.0000 mg | ORAL_TABLET | Freq: Every day | ORAL | Status: DC

## 2013-10-08 MED ORDER — ALPRAZOLAM 1 MG PO TABS: 1.0000 mg | ORAL_TABLET | ORAL | Status: DC | PRN

## 2013-10-08 MED ORDER — CETIRIZINE HCL 10 MG PO TABS: 10.0000 mg | ORAL_TABLET | Freq: Every day | ORAL | Status: DC

## 2013-10-08 MED ORDER — SPIRONOLACTONE 100 MG PO TABS: 100.0000 mg | ORAL_TABLET | Freq: Every day | ORAL | Status: DC

## 2013-10-08 MED ORDER — FUROSEMIDE 20 MG PO TABS: ORAL_TABLET | ORAL | Status: DC

## 2013-10-08 MED ORDER — ALLOPURINOL 300 MG PO TABS: ORAL_TABLET | ORAL | Status: DC

## 2013-10-08 NOTE — Progress Notes (Signed)
Subjective:     Patient ID: Valerie [name], female   DOB: Jan 03, 1961, 53 y.o.   MRN: [id number]  HPI 53 YO morbidly obese female presents to [facility] with 3-4 weeks of excessive fluid collection in both lower extremities and hands, in addition to significant pain "2/2 her fibromyalgia." She says that she usually is on furosemide 20 mg daily prescribed by DR. Lauenstein but that the regimen was stopped in August when she had an acute gastrointestinal illness and dehydration. She states the swelling has been present since but much worse over the past month to the point where she can no longer fit her rings on her fingers. She has never tried to wear compression hoes but states her legs are more swollen by the end of the day and many times go down overnight. She also would like a refill on her medications today normally prescribed by Dr. Joseph Art. She says that her pain medications are mostly prescribed by pain management with whom she has a long history and her pancreatic enzyme replacement is managed elsewhere as well.   Review of Systems  Constitutional: Positive for activity change and fatigue. Negative for fever, chills, diaphoresis and appetite change.  HENT: Positive for congestion, postnasal drip, rhinorrhea, sinus pressure and sneezing. Negative for ear pain.   Eyes: Positive for redness and itching. Negative for pain.  Respiratory: Negative for cough, chest tightness, shortness of breath and wheezing.   Cardiovascular: Positive for leg swelling. Negative for chest pain and palpitations.  Gastrointestinal: Negative for nausea, vomiting, abdominal pain, diarrhea, constipation and abdominal distention.  Endocrine: Negative for polyuria.  Genitourinary: Negative for dysuria and frequency.  Musculoskeletal: Positive for arthralgias, back pain and myalgias.       "2/2 fibromyalgia"  Skin: Negative for rash.  Allergic/Immunologic: Positive for environmental allergies.  Neurological: Positive for  weakness. Negative for dizziness, syncope, light-headedness and headaches.   Current Outpatient Prescriptions on File Prior to Visit  Medication Sig Dispense Refill  . albuterol (PROVENTIL HFA;VENTOLIN HFA) 108 (90 BASE) MCG/ACT inhaler Inhale 2 puffs into the lungs as needed.  6.7 g  6  . allopurinol (ZYLOPRIM) 300 MG tablet TAKE 1/2 TABLET AS DIRECTED  30 tablet  4  . ALPRAZolam (XANAX) 1 MG tablet Take 1 tablet (1 mg total) by mouth as needed.  30 tablet  3  . baclofen (LIORESAL) 10 MG tablet Take 10 mg by mouth. PRN      . benazepril (LOTENSIN) 40 MG tablet Take 1 tablet (40 mg total) by mouth daily.  90 tablet  3  . cetirizine (ZYRTEC) 10 MG tablet Take 10 mg by mouth daily.      . furosemide (LASIX) 20 MG tablet PT NEEDS APPT FOR FURTHER REFILLS  30 tablet  0  . HYDROCODONE-ACETAMINOPHEN PO Take 10-325 mg by mouth. One tablet daily as needed      . ibuprofen (ADVIL,MOTRIN) 600 MG tablet Take 600 mg by mouth as needed.      . lamoTRIgine (LAMICTAL) 100 MG tablet TAKE 1/2 TABLET BY MOUTH DAILY  30 tablet  3  . lipase/protease/amylase (CREON-10/PANCREASE) 12000 UNITS CPEP Take 3 capsules by mouth 3 (three) times daily before meals. 24,000 UNITS      . metoprolol succinate (TOPROL-XL) 100 MG 24 hr tablet Take 1 tablet (100 mg total) by mouth daily. PATIENT NEEDS OFFICE VISIT FOR ADDITIONAL REFILLS  30 tablet  0  . modafinil (PROVIGIL) 200 MG tablet Take 1 tablet (200 mg total) by mouth daily.  30 tablet  5  . morphine (MS CONTIN) 30 MG 12 hr tablet Not from [facility]      . Multiple Vitamin (MULTIVITAMIN) tablet Take 1 tablet by mouth daily.      . Olopatadine HCl (PATADAY) 0.2 % SOLN Apply 1 drop to eye 2 (two) times daily.  2.5 mL  11  . omeprazole (PRILOSEC) 40 MG capsule Take 40 mg by mouth 2 (two) times daily.      . ondansetron (ZOFRAN) 8 MG tablet Take 1 tablet (8 mg total) by mouth every 8 (eight) hours as needed for nausea.  12 tablet  0  . phenazopyridine (PYRIDIUM) 100 MG tablet Take 1  tablet (100 mg total) by mouth 3 (three) times daily as needed for pain.  10 tablet  0  . spironolactone (ALDACTONE) 100 MG tablet Take 1 tablet (100 mg total) by mouth daily. PATIENT NEEDS OFFICE VISIT FOR ADDITIONAL REFILLS  90 tablet  3  . allopurinol (ZYLOPRIM) 300 MG tablet Take 0.5 tablets (150 mg total) by mouth daily.  30 tablet  5  . allopurinol (ZYLOPRIM) 300 MG tablet TAKE 1/2 TABLET AS DIRECTED  30 tablet  4  . Beclomethasone Dipropionate (QNASL) 80 MCG/ACT AERS Place 80 mcg into alternate nostrils 2 (two) times daily.  1 Inhaler  11  . doxycycline (VIBRA-TABS) 100 MG tablet Take 1 tablet (100 mg total) by mouth 2 (two) times daily.  20 tablet  0  . metoprolol succinate (TOPROL-XL) 100 MG 24 hr tablet Take 1 tablet (100 mg total) by mouth daily. PATIENT NEEDS BP CHECK-UP FOR ADDITIONAL REFILLS  30 tablet  0  . mometasone (NASONEX) 50 MCG/ACT nasal spray Place 2 sprays into the nose daily.  17 g  12  . Vitamin D, Ergocalciferol, (DRISDOL) 50000 UNITS CAPS Take 50,000 Units by mouth. 1 WK X'S 1 MO       No current facility-administered medications on file prior to visit.   Past Medical History  Diagnosis Date  . Hypertension   . Anxiety   . Mental disorder   . Depression   . Sleep apnea   . Headache(784.0)   . Neuromuscular disorder   . 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 Fibromyalgia   . Arthritis     degenerative  . Anemia 05/01/2012  . Gastro-esophageal reflux   . Sleep apnea     CPAP-9  . Pancreatitis   . Diarrhea     EPISODIC  . Back pain     lower       Objective:   Physical Exam  Constitutional: She is oriented to person, place, and time. She appears well-developed and well-nourished. No distress.  HENT:  Head: Normocephalic and atraumatic.  Mouth/Throat: Oropharynx is clear and moist.  Eyes: Conjunctivae are normal. Pupils are equal, round, and reactive to light.  Cardiovascular: Normal rate, regular rhythm, normal heart sounds and intact distal  pulses.  Exam reveals no gallop and no friction rub.   No murmur heard. Pulmonary/Chest: Effort normal and breath sounds normal. No respiratory distress. She has no wheezes. She has no rales.  Abdominal: Soft. Bowel sounds are normal. She exhibits no distension. There is no tenderness. There is no rebound and no guarding.  Musculoskeletal:  Decreased ROM and strength in right upper extremity in all movements  Decreased ROM and strength in left hip in all movements  tenderness to palpation diffusely which patient acknowledges is normal with her fibromyalgia  Neurological:  She is alert and oriented to person, place, and time. No cranial nerve deficit.  Skin: Skin is warm and dry. No rash noted. She is not diaphoretic. No erythema.   BP 131/85  Pulse 83  Temp(Src) 98.6 F (37 C) (Oral)  Resp 16  Ht 5' 3.5" (1.613 m)  Wt 301 lb 9.6 oz (136.805 kg)  BMI 52.58 kg/m2  SpO2 97%  Results for orders placed in visit on 10/08/13  POCT CBC      Result Value Ref Range   WBC 7.7  4.6 - 10.2 K/uL   Lymph, poc 2.7  0.6 - 3.4   POC LYMPH PERCENT 34.6  10 - 50 %L   MID (cbc) 0.4  0 - 0.9   POC MID % 5.1  0 - 12 %M   POC Granulocyte 4.6  2 - 6.9   Granulocyte percent 60.3  37 - 80 %G   RBC 4.39  4.04 - 5.48 M/uL   Hemoglobin 13.0  12.2 - 16.2 g/dL   HCT, POC 40.8  37.7 - 47.9 %   MCV 93.0  80 - 97 fL   MCH, POC 29.6  27 - 31.2 pg   MCHC 31.9  31.8 - 35.4 g/dL   RDW, POC 14.0     Platelet Count, POC 332  142 - 424 K/uL   MPV 9.1  0 - 99.8 fL  POCT UA - MICROSCOPIC ONLY      Result Value Ref Range   WBC, Ur, HPF, POC neg     RBC, urine, microscopic 0-1     Bacteria, U Microscopic neg     Mucus, UA neg     Epithelial cells, urine per micros 1-4     Crystals, Ur, HPF, POC neg     Casts, Ur, LPF, POC neg     Yeast, UA neg    POCT URINALYSIS DIPSTICK      Result Value Ref Range   Color, UA yellow     Clarity, UA clear     Glucose, UA neg     Bilirubin, UA neg     Ketones, UA neg      Spec Grav, UA 1.015     Blood, UA neg     pH, UA 6.5     Protein, UA neg     Urobilinogen, UA 0.2     Nitrite, UA neg     Leukocytes, UA Negative         Assessment:      Leg swelling  Fluid retention  Medication refill  Hypertension - Plan: spironolactone (ALDACTONE) 100 MG tablet  Anxiety - Plan: ALPRAZolam (XANAX) 1 MG tablet  PTSD (post-traumatic stress disorder) - Plan: ALPRAZolam (XANAX) 1 MG tablet      Plan:        check CMET and CBC, and urine micro  Refill all medications for 1 year, except XANAX and furosemide, refill xanax and furosemide x 1 month and return to clinic to see how medication is working and follow up with her PCP Dr. Joseph Art

## 2013-10-27 DIAGNOSIS — Z79899 Other long term (current) drug therapy: Secondary | ICD-10-CM | POA: Diagnosis not present

## 2013-10-27 DIAGNOSIS — F3289 Other specified depressive episodes: Secondary | ICD-10-CM | POA: Diagnosis not present

## 2013-10-27 DIAGNOSIS — M545 Low back pain, unspecified: Secondary | ICD-10-CM | POA: Diagnosis not present

## 2013-10-27 DIAGNOSIS — M79609 Pain in unspecified limb: Secondary | ICD-10-CM | POA: Diagnosis not present

## 2013-10-27 DIAGNOSIS — M461 Sacroiliitis, not elsewhere classified: Secondary | ICD-10-CM | POA: Diagnosis not present

## 2013-10-27 DIAGNOSIS — E669 Obesity, unspecified: Secondary | ICD-10-CM | POA: Diagnosis not present

## 2013-10-27 DIAGNOSIS — G4733 Obstructive sleep apnea (adult) (pediatric): Secondary | ICD-10-CM | POA: Diagnosis not present

## 2013-10-27 DIAGNOSIS — R1013 Epigastric pain: Secondary | ICD-10-CM | POA: Diagnosis not present

## 2013-10-27 DIAGNOSIS — F329 Major depressive disorder, single episode, unspecified: Secondary | ICD-10-CM | POA: Diagnosis not present

## 2013-10-27 DIAGNOSIS — F4310 Post-traumatic stress disorder, unspecified: Secondary | ICD-10-CM | POA: Diagnosis not present

## 2013-10-27 DIAGNOSIS — K859 Acute pancreatitis without necrosis or infection, unspecified: Secondary | ICD-10-CM | POA: Diagnosis not present

## 2013-10-27 DIAGNOSIS — Z9889 Other specified postprocedural states: Secondary | ICD-10-CM | POA: Diagnosis not present

## 2013-10-27 DIAGNOSIS — K3189 Other diseases of stomach and duodenum: Secondary | ICD-10-CM | POA: Diagnosis not present

## 2013-10-27 DIAGNOSIS — Z9989 Dependence on other enabling machines and devices: Secondary | ICD-10-CM | POA: Diagnosis not present

## 2013-10-27 DIAGNOSIS — K219 Gastro-esophageal reflux disease without esophagitis: Secondary | ICD-10-CM | POA: Diagnosis not present

## 2013-11-04 DIAGNOSIS — F332 Major depressive disorder, recurrent severe without psychotic features: Secondary | ICD-10-CM | POA: Diagnosis not present

## 2013-11-07 ENCOUNTER — Ambulatory Visit (INDEPENDENT_AMBULATORY_CARE_PROVIDER_SITE_OTHER): Payer: Medicare Other | Admitting: Family Medicine

## 2013-11-07 VITALS — BP 110/70 | HR 77 | Temp 98.4°F | Resp 16 | Ht 65.0 in | Wt 295.0 lb

## 2013-11-07 DIAGNOSIS — I10 Essential (primary) hypertension: Secondary | ICD-10-CM

## 2013-11-07 DIAGNOSIS — G4700 Insomnia, unspecified: Secondary | ICD-10-CM

## 2013-11-07 DIAGNOSIS — R7989 Other specified abnormal findings of blood chemistry: Secondary | ICD-10-CM | POA: Diagnosis not present

## 2013-11-07 DIAGNOSIS — R609 Edema, unspecified: Secondary | ICD-10-CM

## 2013-11-07 DIAGNOSIS — E790 Hyperuricemia without signs of inflammatory arthritis and tophaceous disease: Secondary | ICD-10-CM

## 2013-11-07 DIAGNOSIS — R600 Localized edema: Secondary | ICD-10-CM

## 2013-11-07 MED ORDER — SPIRONOLACTONE 100 MG PO TABS: 100.0000 mg | ORAL_TABLET | Freq: Every day | ORAL | Status: DC

## 2013-11-07 MED ORDER — FUROSEMIDE 20 MG PO TABS: ORAL_TABLET | ORAL | Status: DC

## 2013-11-07 MED ORDER — METOPROLOL SUCCINATE ER 100 MG PO TB24: 100.0000 mg | ORAL_TABLET | Freq: Every day | ORAL | Status: DC

## 2013-11-07 MED ORDER — ALLOPURINOL 300 MG PO TABS: ORAL_TABLET | ORAL | Status: DC

## 2013-11-07 NOTE — Progress Notes (Signed)
This chart was scribed for Kurt Lauestein, MD by Kristina Sanchez-Matthews, ED Scribe. This patient was seen in room Room/bed 3 and the patient's care was started at 8:26 AM. Subjective:    Patient ID: Valerie [name], female    DOB: 01/28/1961, 52 y.o.   MRN: [id number] Chief Complaint  Patient presents with  . Follow-up    Leg swelling  . Medication Refill    Toprol wants 3 month supply   HPI Valerie [name] is a 52 y.o. female who presents to the [facility] for medication refill and F/U apt. Pt states she was doing well until she noticed her swelling throughout her body;states she was unable to put her ring on due to swelling and moderate leg swelling. 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 Pt states she has been taking Lasixs for a while. States she seen Dr. Daub 10/08/2013 for fluid retention and was given Lasix PRN. States the first few days she took the Lasix, she lost 9 lbs; states she eventually gained the weight back and states she noticed the swelling to LE. Pt states she is due for cortisone shot to her pyloric sphincter by Dr. Hayes  Requesting 3 month rx of Toperol. Takes Provigil in the afternoon. Is still taking Lamictal but would like to d/c this medication. Denies taking Doxycycline. Alprenolol & Zyrtec daily. States her BP has been under control.  She also c/o insomnia; states there are some nights she is unable to sleep and is unsure if this is related to menopause.  Pt states she took Melatonin last night and was not able to sleep until 3am.  Patient Active Problem List   Diagnosis Date Noted  . Sleep apnea with use of continuous positive airway pressure (CPAP) 04/15/2013  . Obesity, morbid 10/21/2012  . OSA on CPAP 10/21/2012  . Fibromyalgia 06/22/2012  . Back pain 06/22/2012  . Recurrent pancreatitis 06/22/2012  . Depression 06/22/2012  . Anemia 05/01/2012  . Colitis 04/28/2012  . Lower GI bleed 04/28/2012  . Leukocytosis 04/28/2012  . Colon  polyp 08/01/2011  . PTSD (post-traumatic stress disorder) 08/01/2011  . Hypertension 08/01/2011  . Obesity 08/01/2011  . IBS (irritable bowel syndrome) 08/01/2011   Current outpatient prescriptions:albuterol (PROVENTIL HFA;VENTOLIN HFA) 108 (90 BASE) MCG/ACT inhaler, Inhale 2 puffs into the lungs as needed., Disp: 6.7 g, Rfl: 6;  allopurinol (ZYLOPRIM) 300 MG tablet, TAKE 1/2 TABLET AS DIRECTED, Disp: 30 tablet, Rfl: 5;  ALPRAZolam (XANAX) 1 MG tablet, Take 1 tablet (1 mg total) by mouth as needed., Disp: 30 tablet, Rfl: 0;  baclofen (LIORESAL) 10 MG tablet, Take 10 mg by mouth. PRN, Disp: , Rfl:  Beclomethasone Dipropionate (QNASL) 80 MCG/ACT AERS, Place 80 mcg into alternate nostrils 2 (two) times daily., Disp: 1 Inhaler, Rfl: 11;  benazepril (LOTENSIN) 40 MG tablet, Take 1 tablet (40 mg total) by mouth daily., Disp: 90 tablet, Rfl: 3;  cetirizine (ZYRTEC) 10 MG tablet, Take 1 tablet (10 mg total) by mouth daily., Disp: 30 tablet, Rfl: 11;  Cholecalciferol (VITAMIN D) 2000 UNITS tablet, Take 2,000 Units by mouth daily., Disp: , Rfl:  doxycycline (VIBRA-TABS) 100 MG tablet, Take 1 tablet (100 mg total) by mouth 2 (two) times daily., Disp: 20 tablet, Rfl: 0;  furosemide (LASIX) 20 MG tablet, PT NEEDS APPT FOR FURTHER REFILLS, Disp: 30 tablet, Rfl: 0;  HYDROCODONE-ACETAMINOPHEN PO, Take 10-325 mg by mouth. One tablet daily as needed, Disp: , Rfl: ;  ibuprofen (ADVIL,MOTRIN) 600 MG tablet, Take 600   mg by mouth as needed., Disp: , Rfl:  lamoTRIgine (LAMICTAL) 100 MG tablet, TAKE 1/2 TABLET BY MOUTH DAILY, Disp: 30 tablet, Rfl: 3;  lipase/protease/amylase (CREON-10/PANCREASE) 12000 UNITS CPEP, Take 3 capsules by mouth 3 (three) times daily before meals. 24,000 UNITS, Disp: , Rfl: ;  metoprolol succinate (TOPROL-XL) 100 MG 24 hr tablet, Take 1 tablet (100 mg total) by mouth daily. PATIENT NEEDS BP CHECK-UP FOR ADDITIONAL REFILLS, Disp: 30 tablet, Rfl: 5 modafinil (PROVIGIL) 200 MG tablet, Take 1 tablet (200 mg  total) by mouth daily., Disp: 30 tablet, Rfl: 5;  morphine (MS CONTIN) 30 MG 12 hr tablet, Not from [facility], Disp: , Rfl: ;  Multiple Vitamin (MULTIVITAMIN) tablet, Take 1 tablet by mouth daily., Disp: , Rfl: ;  Olopatadine HCl (PATADAY) 0.2 % SOLN, Apply 1 drop to eye 2 (two) times daily., Disp: 2.5 mL, Rfl: 11 omeprazole (PRILOSEC) 40 MG capsule, Take 40 mg by mouth 2 (two) times daily., Disp: , Rfl: ;  ondansetron (ZOFRAN) 8 MG tablet, Take 1 tablet (8 mg total) by mouth every 8 (eight) hours as needed for nausea., Disp: 12 tablet, Rfl: 0;  phenazopyridine (PYRIDIUM) 100 MG tablet, Take 1 tablet (100 mg total) by mouth 3 (three) times daily as needed for pain., Disp: 10 tablet, Rfl: 0 spironolactone (ALDACTONE) 100 MG tablet, Take 1 tablet (100 mg total) by mouth daily. PATIENT NEEDS OFFICE VISIT FOR ADDITIONAL REFILLS, Disp: 90 tablet, Rfl: 1;  Vitamin D, Ergocalciferol, (DRISDOL) 50000 UNITS CAPS, Take 50,000 Units by mouth. 1 WK X'S 1 MO, Disp: , Rfl: ;  mometasone (NASONEX) 50 MCG/ACT nasal spray, Place 2 sprays into the nose daily., Disp: 17 g, Rfl: 12  Review of Systems  Cardiovascular: Positive for leg swelling.  Psychiatric/Behavioral: Positive for sleep disturbance.   Objective:   Physical Exam  Nursing note and vitals reviewed. Constitutional: She is oriented to person, place, and time. She appears well-developed and well-nourished. No distress.  HENT:  Head: Normocephalic and atraumatic.  Right Ear: External ear normal.  Left Ear: External ear normal.  Eyes: Conjunctivae are normal. Pupils are equal, round, and reactive to light.  Neck: Normal range of motion. Neck supple.  Cardiovascular: Normal rate, regular rhythm and normal heart sounds.   No murmur heard. Trace edema to lower extremities.  Pulmonary/Chest: Effort normal and breath sounds normal. No respiratory distress.  Abdominal: Bowel sounds are normal.  Musculoskeletal: Normal range of motion.  Neurological: She is alert  and oriented to person, place, and time. She has normal reflexes.  Skin: Skin is warm and dry. She is not diaphoretic.  Psychiatric: She has a normal mood and affect. Her behavior is normal.   Triage Vitals:BP 110/70  Pulse 77  Temp(Src) 98.4 F (36.9 C) (Oral)  Resp 16  Ht 5' 5" (1.651 m)  Wt 295 lb (133.811 kg)  BMI 49.09 kg/m2  SpO2 96% Assessment & Plan:   Hypertension - Plan: metoprolol succinate (TOPROL-XL) 100 MG 24 hr tablet, spironolactone (ALDACTONE) 100 MG tablet  Hyperuricemia - Plan: allopurinol (ZYLOPRIM) 300 MG tablet  Pedal edema - Plan: furosemide (LASIX) 20 MG tablet  Insomnia  Signed, Kurt Lauenstein, MD    

## 2013-11-25 DIAGNOSIS — K3184 Gastroparesis: Secondary | ICD-10-CM | POA: Diagnosis not present

## 2013-11-25 DIAGNOSIS — R1011 Right upper quadrant pain: Secondary | ICD-10-CM | POA: Diagnosis not present

## 2013-12-02 ENCOUNTER — Other Ambulatory Visit: Payer: Self-pay | Admitting: Gastroenterology

## 2013-12-02 NOTE — Addendum Note (Signed)
Addended by: Teena Irani on: 12/02/2013 11:18 AM   Modules accepted: Orders

## 2013-12-04 ENCOUNTER — Ambulatory Visit (HOSPITAL_COMMUNITY)
Admission: RE | Admit: 2013-12-04 | Discharge: 2013-12-04 | Disposition: A | Discharge status: Home / Self Care | Payer: Medicare Other | Source: Ambulatory Visit | Attending: Gastroenterology | Admitting: Gastroenterology

## 2013-12-04 ENCOUNTER — Encounter (HOSPITAL_COMMUNITY)
Admission: RE | Disposition: A | Discharge status: Home / Self Care | Payer: Self-pay | Source: Ambulatory Visit | Attending: Gastroenterology

## 2013-12-04 ENCOUNTER — Encounter (HOSPITAL_COMMUNITY): Payer: Self-pay | Admitting: *Deleted

## 2013-12-04 DIAGNOSIS — I10 Essential (primary) hypertension: Secondary | ICD-10-CM | POA: Diagnosis not present

## 2013-12-04 DIAGNOSIS — K3189 Other diseases of stomach and duodenum: Secondary | ICD-10-CM | POA: Diagnosis not present

## 2013-12-04 DIAGNOSIS — G4730 Sleep apnea, unspecified: Secondary | ICD-10-CM | POA: Diagnosis not present

## 2013-12-04 DIAGNOSIS — R1013 Epigastric pain: Secondary | ICD-10-CM | POA: Diagnosis not present

## 2013-12-04 DIAGNOSIS — IMO0001 Reserved for inherently not codable concepts without codable children: Secondary | ICD-10-CM | POA: Insufficient documentation

## 2013-12-04 DIAGNOSIS — R609 Edema, unspecified: Secondary | ICD-10-CM | POA: Diagnosis not present

## 2013-12-04 DIAGNOSIS — R7989 Other specified abnormal findings of blood chemistry: Secondary | ICD-10-CM | POA: Diagnosis not present

## 2013-12-04 DIAGNOSIS — K3184 Gastroparesis: Secondary | ICD-10-CM | POA: Diagnosis not present

## 2013-12-04 DIAGNOSIS — G4700 Insomnia, unspecified: Secondary | ICD-10-CM | POA: Diagnosis not present

## 2013-12-04 DIAGNOSIS — K589 Irritable bowel syndrome without diarrhea: Secondary | ICD-10-CM | POA: Diagnosis not present

## 2013-12-04 DIAGNOSIS — E669 Obesity, unspecified: Secondary | ICD-10-CM

## 2013-12-04 DIAGNOSIS — R1011 Right upper quadrant pain: Secondary | ICD-10-CM | POA: Diagnosis not present

## 2013-12-04 HISTORY — PX: ESOPHAGOGASTRODUODENOSCOPY: SHX5428

## 2013-12-04 HISTORY — PX: BOTOX INJECTION: SHX5754

## 2013-12-04 SURGERY — EGD (ESOPHAGOGASTRODUODENOSCOPY)
Anesthesia: Moderate Sedation

## 2013-12-04 MED ORDER — DIPHENHYDRAMINE HCL 50 MG/ML IJ SOLN
INTRAMUSCULAR | Status: AC
  Filled 2013-12-04: qty 1

## 2013-12-04 MED ORDER — SODIUM CHLORIDE 0.9 % IJ SOLN
100.0000 [IU] | Freq: Once | INTRAMUSCULAR | Status: DC
  Filled 2013-12-04: qty 100

## 2013-12-04 MED ORDER — SODIUM CHLORIDE 0.9 % IJ SOLN
INTRAMUSCULAR | Status: DC | PRN
  Administered 2013-12-04: 13:00:00 via SUBMUCOSAL

## 2013-12-04 MED ORDER — SODIUM CHLORIDE 0.9 % IV SOLN
INTRAVENOUS | Status: DC
  Administered 2013-12-04: 11:00:00 via INTRAVENOUS

## 2013-12-04 MED ORDER — BUTAMBEN-TETRACAINE-BENZOCAINE 2-2-14 % EX AERO
INHALATION_SPRAY | CUTANEOUS | Status: DC | PRN
  Administered 2013-12-04: 2 via TOPICAL

## 2013-12-04 MED ORDER — FENTANYL CITRATE 0.05 MG/ML IJ SOLN
INTRAMUSCULAR | Status: DC | PRN
  Administered 2013-12-04 (×3): 25 ug via INTRAVENOUS

## 2013-12-04 MED ORDER — DIPHENHYDRAMINE HCL 50 MG/ML IJ SOLN
INTRAMUSCULAR | Status: DC | PRN
  Administered 2013-12-04: 50 mg via INTRAVENOUS

## 2013-12-04 MED ORDER — FENTANYL CITRATE 0.05 MG/ML IJ SOLN
INTRAMUSCULAR | Status: AC
  Filled 2013-12-04: qty 4

## 2013-12-04 MED ORDER — MIDAZOLAM HCL 10 MG/2ML IJ SOLN
INTRAMUSCULAR | Status: DC | PRN
  Administered 2013-12-04 (×3): 2 mg via INTRAVENOUS

## 2013-12-04 MED ORDER — MIDAZOLAM HCL 5 MG/ML IJ SOLN
INTRAMUSCULAR | Status: AC
  Filled 2013-12-04: qty 2

## 2013-12-04 NOTE — Interval H&P Note (Signed)
History and Physical Interval Note:  12/04/2013 12:04 PM  Valerie [name]  has presented today for surgery, with the diagnosis of abdominall pain, gastroparesis  The various methods of treatment have been discussed with the patient and family. After consideration of risks, benefits and other options for treatment, the patient has consented to  Procedure(s): ESOPHAGOGASTRODUODENOSCOPY (EGD) (N/A) BOTOX INJECTION (N/A) as a surgical intervention .  The patient's history has been reviewed, patient examined, no change in status, stable for surgery.  I have reviewed the patient's chart and labs.  Questions were answered to the patient's satisfaction.     HAYES,JOHN C

## 2013-12-04 NOTE — Op Note (Signed)
[facility] [place], [id number]   ENDOSCOPY PROCEDURE REPORT  PATIENT: Valerie [name], Valerie [name]  MR#: [id number] BIRTHDATE: [birth year] , 52  yrs. old GENDER: Female ENDOSCOPIST:John Amedeo Plenty, MD REFERRED BY: PROCEDURE DATE:  12/04/2013 PROCEDURE: ASA CLASS: INDICATIONS:  gastroparesis MEDICATION:    fentanyl 75 mcg, Versed 6 mg, Benadryl 50 mg. TOPICAL ANESTHETIC:   Cetacaine spray  DESCRIPTION OF PROCEDURE:   esophagus: Normal Stomach: Small amount of bezoar in the dependent portions otherwise normal. 25 units of Botox was injected in each of 4 quadrants around the pylorus successfully. Duodenum: Normal     COMPLICATIONS: None  ENDOSCOPIC IMPRESSION:small amount of gastric bezoar, status post Botox injection of the pyloric sphincter for gastroparesis which has been effective in the past.  RECOMMENDATIONS:    _______________________________ Lorrin MaisTeena Irani, MD 12/04/2013 12:21 PM

## 2013-12-04 NOTE — H&P (View-Only) (Signed)
This chart was scribed for Caroleen Hamman, MD by Eston Mould, ED Scribe. This patient was seen in room Room/bed 3 and the patient's care was started at 8:26 AM. Subjective:    Patient ID: Valerie [name], female    DOB: 09/26/1960, 53 y.o.   MRN: [id number] Chief Complaint  Patient presents with  . Follow-up    Leg swelling  . Medication Refill    Toprol wants 3 month supply   HPI Valerie [name] is a 53 y.o. female who presents to the [facility] for medication refill and F/U apt. Pt states she was doing well until she noticed her swelling throughout her body;states she was unable to put her ring on due to swelling and moderate leg swelling. 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 Pt states she has been taking Lasixs for a while. States she seen Dr. Everlene Farrier 10/08/2013 for fluid retention and was given Lasix PRN. States the first few days she took the Lasix, she lost 9 lbs; states she eventually gained the weight back and states she noticed the swelling to LE. Pt states she is due for cortisone shot to her pyloric sphincter by Dr. Amedeo Plenty  Requesting 3 month rx of Toperol. Takes Provigil in the afternoon. Is still taking Lamictal but would like to d/c this medication. Denies taking Doxycycline. Alprenolol & Zyrtec daily. States her BP has been under control.  She also c/o insomnia; states there are some nights she is unable to sleep and is unsure if this is related to menopause.  Pt states she took Melatonin last night and was not able to sleep until 3am.  Patient Active Problem List   Diagnosis Date Noted  . Sleep apnea with use of continuous positive airway pressure (CPAP) 04/15/2013  . Obesity, morbid 10/21/2012  . OSA on CPAP 10/21/2012  . Fibromyalgia 06/22/2012  . Back pain 06/22/2012  . Recurrent pancreatitis 06/22/2012  . Depression 06/22/2012  . Anemia 05/01/2012  . Colitis 04/28/2012  . Lower GI bleed 04/28/2012  . Leukocytosis 04/28/2012  . Colon  polyp 08/01/2011  . PTSD (post-traumatic stress disorder) 08/01/2011  . Hypertension 08/01/2011  . Obesity 08/01/2011  . IBS (irritable bowel syndrome) 08/01/2011   Current outpatient prescriptions:albuterol (PROVENTIL HFA;VENTOLIN HFA) 108 (90 BASE) MCG/ACT inhaler, Inhale 2 puffs into the lungs as needed., Disp: 6.7 g, Rfl: 6;  allopurinol (ZYLOPRIM) 300 MG tablet, TAKE 1/2 TABLET AS DIRECTED, Disp: 30 tablet, Rfl: 5;  ALPRAZolam (XANAX) 1 MG tablet, Take 1 tablet (1 mg total) by mouth as needed., Disp: 30 tablet, Rfl: 0;  baclofen (LIORESAL) 10 MG tablet, Take 10 mg by mouth. PRN, Disp: , Rfl:  Beclomethasone Dipropionate (QNASL) 80 MCG/ACT AERS, Place 80 mcg into alternate nostrils 2 (two) times daily., Disp: 1 Inhaler, Rfl: 11;  benazepril (LOTENSIN) 40 MG tablet, Take 1 tablet (40 mg total) by mouth daily., Disp: 90 tablet, Rfl: 3;  cetirizine (ZYRTEC) 10 MG tablet, Take 1 tablet (10 mg total) by mouth daily., Disp: 30 tablet, Rfl: 11;  Cholecalciferol (VITAMIN D) 2000 UNITS tablet, Take 2,000 Units by mouth daily., Disp: , Rfl:  doxycycline (VIBRA-TABS) 100 MG tablet, Take 1 tablet (100 mg total) by mouth 2 (two) times daily., Disp: 20 tablet, Rfl: 0;  furosemide (LASIX) 20 MG tablet, PT NEEDS APPT FOR FURTHER REFILLS, Disp: 30 tablet, Rfl: 0;  HYDROCODONE-ACETAMINOPHEN PO, Take 10-325 mg by mouth. One tablet daily as needed, Disp: , Rfl: ;  ibuprofen (ADVIL,MOTRIN) 600 MG tablet, Take 600  mg by mouth as needed., Disp: , Rfl:  lamoTRIgine (LAMICTAL) 100 MG tablet, TAKE 1/2 TABLET BY MOUTH DAILY, Disp: 30 tablet, Rfl: 3;  lipase/protease/amylase (CREON-10/PANCREASE) 12000 UNITS CPEP, Take 3 capsules by mouth 3 (three) times daily before meals. 24,000 UNITS, Disp: , Rfl: ;  metoprolol succinate (TOPROL-XL) 100 MG 24 hr tablet, Take 1 tablet (100 mg total) by mouth daily. PATIENT NEEDS BP CHECK-UP FOR ADDITIONAL REFILLS, Disp: 30 tablet, Rfl: 5 modafinil (PROVIGIL) 200 MG tablet, Take 1 tablet (200 mg  total) by mouth daily., Disp: 30 tablet, Rfl: 5;  morphine (MS CONTIN) 30 MG 12 hr tablet, Not from [facility], Disp: , Rfl: ;  Multiple Vitamin (MULTIVITAMIN) tablet, Take 1 tablet by mouth daily., Disp: , Rfl: ;  Olopatadine HCl (PATADAY) 0.2 % SOLN, Apply 1 drop to eye 2 (two) times daily., Disp: 2.5 mL, Rfl: 11 omeprazole (PRILOSEC) 40 MG capsule, Take 40 mg by mouth 2 (two) times daily., Disp: , Rfl: ;  ondansetron (ZOFRAN) 8 MG tablet, Take 1 tablet (8 mg total) by mouth every 8 (eight) hours as needed for nausea., Disp: 12 tablet, Rfl: 0;  phenazopyridine (PYRIDIUM) 100 MG tablet, Take 1 tablet (100 mg total) by mouth 3 (three) times daily as needed for pain., Disp: 10 tablet, Rfl: 0 spironolactone (ALDACTONE) 100 MG tablet, Take 1 tablet (100 mg total) by mouth daily. PATIENT NEEDS OFFICE VISIT FOR ADDITIONAL REFILLS, Disp: 90 tablet, Rfl: 1;  Vitamin D, Ergocalciferol, (DRISDOL) 50000 UNITS CAPS, Take 50,000 Units by mouth. 1 WK X'S 1 MO, Disp: , Rfl: ;  mometasone (NASONEX) 50 MCG/ACT nasal spray, Place 2 sprays into the nose daily., Disp: 17 g, Rfl: 12  Review of Systems  Cardiovascular: Positive for leg swelling.  Psychiatric/Behavioral: Positive for sleep disturbance.   Objective:   Physical Exam  Nursing note and vitals reviewed. Constitutional: She is oriented to person, place, and time. She appears well-developed and well-nourished. No distress.  HENT:  Head: Normocephalic and atraumatic.  Right Ear: External ear normal.  Left Ear: External ear normal.  Eyes: Conjunctivae are normal. Pupils are equal, round, and reactive to light.  Neck: Normal range of motion. Neck supple.  Cardiovascular: Normal rate, regular rhythm and normal heart sounds.   No murmur heard. Trace edema to lower extremities.  Pulmonary/Chest: Effort normal and breath sounds normal. No respiratory distress.  Abdominal: Bowel sounds are normal.  Musculoskeletal: Normal range of motion.  Neurological: She is alert  and oriented to person, place, and time. She has normal reflexes.  Skin: Skin is warm and dry. She is not diaphoretic.  Psychiatric: She has a normal mood and affect. Her behavior is normal.   Triage Vitals:BP 110/70  Pulse 77  Temp(Src) 98.4 F (36.9 C) (Oral)  Resp 16  Ht 5\' 5"  (1.651 m)  Wt 295 lb (133.811 kg)  BMI 49.09 kg/m2  SpO2 96% Assessment & Plan:   Hypertension - Plan: metoprolol succinate (TOPROL-XL) 100 MG 24 hr tablet, spironolactone (ALDACTONE) 100 MG tablet  Hyperuricemia - Plan: allopurinol (ZYLOPRIM) 300 MG tablet  Pedal edema - Plan: furosemide (LASIX) 20 MG tablet  Insomnia  Signed, [name], MD

## 2013-12-04 NOTE — Interval H&P Note (Deleted)
History and Physical Interval Note:  12/04/2013 10:31 AM  Valerie [name]  has presented today for surgery, with the diagnosis of abdominall pain, gastroparesis  The various methods of treatment have been discussed with the patient and family. After consideration of risks, benefits and other options for treatment, the patient has consented to  Procedure(s): ESOPHAGOGASTRODUODENOSCOPY (EGD) (N/A) BOTOX INJECTION (N/A) as a surgical intervention .  The patient's history has been reviewed, patient examined, no change in status, stable for surgery.  I have reviewed the patient's chart and labs.  Questions were answered to the patient's satisfaction.     HAYES,JOHN C

## 2013-12-05 ENCOUNTER — Ambulatory Visit (INDEPENDENT_AMBULATORY_CARE_PROVIDER_SITE_OTHER): Payer: Medicare Other | Admitting: Family Medicine

## 2013-12-05 ENCOUNTER — Encounter (HOSPITAL_COMMUNITY): Payer: Self-pay | Admitting: Gastroenterology

## 2013-12-05 VITALS — BP 100/60 | HR 87 | Temp 98.1°F | Resp 18 | Ht 64.25 in | Wt 286.4 lb

## 2013-12-05 DIAGNOSIS — L989 Disorder of the skin and subcutaneous tissue, unspecified: Secondary | ICD-10-CM | POA: Diagnosis not present

## 2013-12-05 DIAGNOSIS — R630 Anorexia: Secondary | ICD-10-CM | POA: Diagnosis not present

## 2013-12-05 DIAGNOSIS — R5381 Other malaise: Secondary | ICD-10-CM | POA: Diagnosis not present

## 2013-12-05 DIAGNOSIS — R5383 Other fatigue: Secondary | ICD-10-CM

## 2013-12-05 DIAGNOSIS — R61 Generalized hyperhidrosis: Secondary | ICD-10-CM | POA: Diagnosis not present

## 2013-12-05 LAB — POCT URINALYSIS DIPSTICK
Bilirubin, UA: NEGATIVE
Blood, UA: NEGATIVE
Glucose, UA: NEGATIVE
Ketones, UA: NEGATIVE
Leukocytes, UA: NEGATIVE
Nitrite, UA: NEGATIVE
Protein, UA: NEGATIVE
Spec Grav, UA: 1.015
Urobilinogen, UA: 0.2
pH, UA: 5

## 2013-12-05 LAB — CBC WITH DIFFERENTIAL/PLATELET
Basophils Absolute: 0 10*3/uL (ref 0.0–0.1)
Basophils Relative: 0 % (ref 0–1)
Eosinophils Absolute: 0.2 10*3/uL (ref 0.0–0.7)
Eosinophils Relative: 3 % (ref 0–5)
HCT: 34.8 % — ABNORMAL LOW (ref 36.0–46.0)
Hemoglobin: 11.8 g/dL — ABNORMAL LOW (ref 12.0–15.0)
Lymphocytes Relative: 31 % (ref 12–46)
Lymphs Abs: 2.4 10*3/uL (ref 0.7–4.0)
MCH: 29.6 pg (ref 26.0–34.0)
MCHC: 33.9 g/dL (ref 30.0–36.0)
MCV: 87.2 fL (ref 78.0–100.0)
Monocytes Absolute: 0.5 10*3/uL (ref 0.1–1.0)
Monocytes Relative: 7 % (ref 3–12)
Neutro Abs: 4.5 10*3/uL (ref 1.7–7.7)
Neutrophils Relative %: 59 % (ref 43–77)
Platelets: 300 10*3/uL (ref 150–400)
RBC: 3.99 MIL/uL (ref 3.87–5.11)
RDW: 14.4 % (ref 11.5–15.5)
WBC: 7.6 10*3/uL (ref 4.0–10.5)

## 2013-12-05 LAB — POCT UA - MICROSCOPIC ONLY
Casts, Ur, LPF, POC: NEGATIVE
Crystals, Ur, HPF, POC: NEGATIVE
Mucus, UA: NEGATIVE
Yeast, UA: NEGATIVE

## 2013-12-05 LAB — COMPREHENSIVE METABOLIC PANEL
ALT: 11 U/L (ref 0–35)
AST: 12 U/L (ref 0–37)
Albumin: 4.5 g/dL (ref 3.5–5.2)
Alkaline Phosphatase: 46 U/L (ref 39–117)
BUN: 20 mg/dL (ref 6–23)
CO2: 22 mEq/L (ref 19–32)
Calcium: 9.6 mg/dL (ref 8.4–10.5)
Chloride: 108 mEq/L (ref 96–112)
Creat: 1.72 mg/dL — ABNORMAL HIGH (ref 0.50–1.10)
Glucose, Bld: 97 mg/dL (ref 70–99)
Potassium: 5.7 mEq/L — ABNORMAL HIGH (ref 3.5–5.3)
Sodium: 137 mEq/L (ref 135–145)
Total Bilirubin: 0.5 mg/dL (ref 0.2–1.2)
Total Protein: 7.3 g/dL (ref 6.0–8.3)

## 2013-12-05 LAB — POCT SEDIMENTATION RATE: POCT SED RATE: 43 mm/hr — AB (ref 0–22)

## 2013-12-05 LAB — TSH: TSH: 1.799 u[IU]/mL (ref 0.350–4.500)

## 2013-12-05 NOTE — Progress Notes (Signed)
Subjective:    Patient ID: Valerie [name], female    DOB: [birth year], 53 y.o.   MRN: [id number]  HPI Chief Complaint  Patient presents with   Follow-up    recheck on kidney functions and has questions--referral for a dermatologist due to a mole   This chart was scribed for [name], MD by Thea Alken, ED Scribe. This patient was seen in room 2 and the patient's care was started at 8:59 AM.  HPI Comments: Valerie [name] is a 53 y.o. female who presents to the [facility] for a f/u regarding kidney function. Pt has not felt well for the past 3 months. She reports an intermittent, low grade fever, decreased appetite, nausea,  fatigue and weight loss. Pt was seen by Dr. Amedeo Plenty 1 day ago, her gastroenterology, and was diagnosed with gastroparesis. Pt state she spends her day cleaning and dog sitting. Pt denies sleep disturbances and difficulty urinating. Pt denies recent tick and rash.  Pt has recent found a new mole located in right 5th toe. Pt does not have a dermatologist.    Patient Active Problem List   Diagnosis Date Noted   Sleep apnea with use of continuous positive airway pressure (CPAP) 04/15/2013   Obesity, morbid 10/21/2012   OSA on CPAP 10/21/2012   Fibromyalgia 06/22/2012   Back pain 06/22/2012   Recurrent pancreatitis 06/22/2012   Depression 06/22/2012   Anemia 05/01/2012   Colitis 04/28/2012   Lower GI bleed 04/28/2012   Leukocytosis 04/28/2012   Colon polyp 08/01/2011   PTSD (post-traumatic stress disorder) 08/01/2011   Hypertension 08/01/2011   Obesity 08/01/2011   IBS (irritable bowel syndrome) 08/01/2011   Past Medical History  Diagnosis Date   Hypertension    Anxiety    Mental disorder    Depression    Sleep apnea    Headache(784.0)    Neuromuscular disorder    Nerve pain     nerve damage to right arm   Fibromyalgia    Arthritis     degenerative   Anemia 05/01/2012   Gastro-esophageal  reflux    Sleep apnea     CPAP-9   Pancreatitis    Diarrhea     EPISODIC   Back pain     lower   Allergies  Allergen Reactions   Dilaudid [Hydromorphone Hcl] Other (See Comments)    Headache    Pregabalin    Metoclopramide Hcl Other (See Comments)    "crawling inside"   Neurontin [Gabapentin] Other (See Comments)    Tremors    Other Other (See Comments)    Avoid all SSRI's and SNRI's- damaged pancreas   Reglan [Metoclopramide]    Prior to Admission medications   Medication Sig Start Date End Date Taking? Authorizing Provider  albuterol (PROVENTIL HFA;VENTOLIN HFA) 108 (90 BASE) MCG/ACT inhaler Inhale 2 puffs into the lungs as needed. 07/05/12  Yes Darlyne Russian, MD  allopurinol (ZYLOPRIM) 300 MG tablet TAKE 1/2 TABLET AS DIRECTED 11/07/13  Yes [name], MD  ALPRAZolam Duanne Moron) 1 MG tablet Take 1 tablet (1 mg total) by mouth as needed. 10/08/13  Yes Darlyne Russian, MD  baclofen (LIORESAL) 10 MG tablet Take 10 mg by mouth. PRN 04/13/13  Yes [name], MD  Beclomethasone Dipropionate (QNASL) 80 MCG/ACT AERS Place 80 mcg into alternate nostrils 2 (two) times daily. 05/28/13  Yes [name], MD  benazepril (LOTENSIN) 40 MG tablet Take 1 tablet (40 mg total) by mouth daily. 01/29/13  Yes [name], MD  cetirizine (ZYRTEC) 10 MG tablet Take 1 tablet (10 mg total) by mouth daily. 10/08/13  Yes Darlyne Russian, MD  furosemide (LASIX) 20 MG tablet One daily prn 11/07/13  Yes [name], MD  HYDROCODONE-ACETAMINOPHEN PO Take 10-325 mg by mouth. One tablet daily as needed   Yes Historical Provider, MD  ibuprofen (ADVIL,MOTRIN) 600 MG tablet Take 600 mg by mouth as needed.   Yes Historical Provider, MD  lipase/protease/amylase (CREON-10/PANCREASE) 12000 UNITS CPEP Take 3 capsules by mouth 3 (three) times daily before meals. 24,000 UNITS   Yes Historical Provider, MD  metoprolol succinate (TOPROL-XL) 100 MG 24 hr tablet Take 1 tablet (100 mg total) by mouth daily.  11/07/13  Yes [name], MD  modafinil (PROVIGIL) 200 MG tablet Take 1 tablet (200 mg total) by mouth daily. 07/02/13  Yes Carmen Dohmeier, MD  mometasone (NASONEX) 50 MCG/ACT nasal spray Place 2 sprays into the nose daily. 05/28/13  Yes [name], MD  morphine (MS CONTIN) 30 MG 12 hr tablet Not from [place] 09/23/12  Yes Historical Provider, MD  Multiple Vitamin (MULTIVITAMIN) tablet Take 1 tablet by mouth daily.   Yes Historical Provider, MD  Olopatadine HCl (PATADAY) 0.2 % SOLN Apply 1 drop to eye 2 (two) times daily. 05/28/13  Yes [name], MD  omeprazole (PRILOSEC) 40 MG capsule Take 40 mg by mouth 2 (two) times daily.   Yes Historical Provider, MD  ondansetron (ZOFRAN) 8 MG tablet Take 1 tablet (8 mg total) by mouth every 8 (eight) hours as needed for nausea. 01/29/13  Yes [name], MD  promethazine (PHENERGAN) 25 MG tablet Take 25 mg by mouth every 6 (six) hours as needed for nausea or vomiting.   Yes Historical Provider, MD  spironolactone (ALDACTONE) 100 MG tablet Take 1 tablet (100 mg total) by mouth daily. 11/07/13  Yes [name], MD  Vitamin D, Ergocalciferol, (DRISDOL) 50000 UNITS CAPS Take 50,000 Units by mouth. 1 WK X'S 1 MO   Yes Historical Provider, MD  Cholecalciferol (VITAMIN D) 2000 UNITS tablet Take 2,000 Units by mouth daily.    Historical Provider, MD    Review of Systems  Constitutional: Positive for fever, appetite change, fatigue and unexpected weight change.  Gastrointestinal: Positive for nausea.  Genitourinary: Negative for difficulty urinating.  Skin: Negative for rash.  Psychiatric/Behavioral: Negative for sleep disturbance.       Objective:   Physical Exam Filed Vitals:   12/05/13 0838  BP: 100/60  Pulse: 87  Temp: 98.1 F (36.7 C)  Resp: 18       Assessment & Plan:  Patient's physical exam is essentially normal with exception of obesity. She does seem pretty depressed.  I continue to be concerned about this woman regarding  her depression and have repeatedly asked her to followup with her psychiatrist. I also feel that she has a problem with polypharmacy.  We'll review her labs today and see if there any other additional ways we can help her.

## 2013-12-08 LAB — B. BURGDORFI ANTIBODIES BY WB
B burgdorferi IgG Abs (IB): NEGATIVE
B burgdorferi IgM Abs (IB): NEGATIVE

## 2013-12-08 LAB — ROCKY MTN SPOTTED FVR AB, IGM-BLOOD: ROCKY MTN SPOTTED FEVER, IGM: 0.1 IV

## 2013-12-18 DIAGNOSIS — D234 Other benign neoplasm of skin of scalp and neck: Secondary | ICD-10-CM | POA: Diagnosis not present

## 2013-12-18 DIAGNOSIS — D2360 Other benign neoplasm of skin of unspecified upper limb, including shoulder: Secondary | ICD-10-CM | POA: Diagnosis not present

## 2013-12-18 DIAGNOSIS — L905 Scar conditions and fibrosis of skin: Secondary | ICD-10-CM | POA: Diagnosis not present

## 2013-12-18 DIAGNOSIS — D485 Neoplasm of uncertain behavior of skin: Secondary | ICD-10-CM | POA: Diagnosis not present

## 2013-12-19 ENCOUNTER — Other Ambulatory Visit: Payer: Self-pay | Admitting: Family Medicine

## 2013-12-22 DIAGNOSIS — D239 Other benign neoplasm of skin, unspecified: Secondary | ICD-10-CM | POA: Diagnosis not present

## 2013-12-23 ENCOUNTER — Other Ambulatory Visit: Payer: Self-pay

## 2013-12-23 DIAGNOSIS — G4733 Obstructive sleep apnea (adult) (pediatric): Secondary | ICD-10-CM

## 2013-12-23 DIAGNOSIS — G4730 Sleep apnea, unspecified: Secondary | ICD-10-CM

## 2013-12-23 DIAGNOSIS — Z9989 Dependence on other enabling machines and devices: Secondary | ICD-10-CM

## 2013-12-23 DIAGNOSIS — G4710 Hypersomnia, unspecified: Secondary | ICD-10-CM

## 2013-12-23 MED ORDER — MODAFINIL 200 MG PO TABS: 200.0000 mg | ORAL_TABLET | Freq: Every day | ORAL | Status: DC

## 2013-12-24 DIAGNOSIS — F332 Major depressive disorder, recurrent severe without psychotic features: Secondary | ICD-10-CM | POA: Diagnosis not present

## 2013-12-24 NOTE — Telephone Encounter (Signed)
Rx has been faxed.

## 2014-01-06 DIAGNOSIS — I10 Essential (primary) hypertension: Secondary | ICD-10-CM | POA: Diagnosis not present

## 2014-01-06 DIAGNOSIS — N183 Chronic kidney disease, stage 3 unspecified: Secondary | ICD-10-CM | POA: Diagnosis not present

## 2014-01-06 DIAGNOSIS — F332 Major depressive disorder, recurrent severe without psychotic features: Secondary | ICD-10-CM | POA: Diagnosis not present

## 2014-01-07 ENCOUNTER — Ambulatory Visit (INDEPENDENT_AMBULATORY_CARE_PROVIDER_SITE_OTHER): Payer: Medicare Other

## 2014-01-07 ENCOUNTER — Ambulatory Visit (INDEPENDENT_AMBULATORY_CARE_PROVIDER_SITE_OTHER): Payer: Medicare Other | Admitting: Family Medicine

## 2014-01-07 VITALS — BP 120/68 | HR 73 | Temp 98.4°F | Resp 20 | Ht 64.25 in | Wt 279.6 lb

## 2014-01-07 DIAGNOSIS — N183 Chronic kidney disease, stage 3 unspecified: Secondary | ICD-10-CM

## 2014-01-07 DIAGNOSIS — D631 Anemia in chronic kidney disease: Secondary | ICD-10-CM

## 2014-01-07 DIAGNOSIS — R5383 Other fatigue: Secondary | ICD-10-CM | POA: Diagnosis not present

## 2014-01-07 DIAGNOSIS — M79671 Pain in right foot: Secondary | ICD-10-CM

## 2014-01-07 DIAGNOSIS — N039 Chronic nephritic syndrome with unspecified morphologic changes: Secondary | ICD-10-CM

## 2014-01-07 DIAGNOSIS — R5381 Other malaise: Secondary | ICD-10-CM | POA: Diagnosis not present

## 2014-01-07 DIAGNOSIS — M779 Enthesopathy, unspecified: Secondary | ICD-10-CM

## 2014-01-07 DIAGNOSIS — M79609 Pain in unspecified limb: Secondary | ICD-10-CM

## 2014-01-07 DIAGNOSIS — M659 Synovitis and tenosynovitis, unspecified: Secondary | ICD-10-CM

## 2014-01-07 DIAGNOSIS — M778 Other enthesopathies, not elsewhere classified: Secondary | ICD-10-CM

## 2014-01-07 LAB — POCT CBC
Granulocyte percent: 31.3 %G — AB (ref 37–80)
HCT, POC: 40.5 % (ref 37.7–47.9)
Hemoglobin: 13.4 g/dL (ref 12.2–16.2)
Lymph, poc: 3.6 — AB (ref 0.6–3.4)
MCH, POC: 30.8 pg (ref 27–31.2)
MCHC: 33.1 g/dL (ref 31.8–35.4)
MCV: 93.1 fL (ref 80–97)
MID (cbc): 0.6 (ref 0–0.9)
MPV: 8.1 fL (ref 0–99.8)
POC Granulocyte: 7.3 — AB (ref 2–6.9)
POC LYMPH PERCENT: 31.3 %L (ref 10–50)
POC MID %: 4.9 %M (ref 0–12)
Platelet Count, POC: 336 10*3/uL (ref 142–424)
RBC: 4.35 M/uL (ref 4.04–5.48)
RDW, POC: 16 %
WBC: 11.5 10*3/uL — AB (ref 4.6–10.2)

## 2014-01-07 MED ORDER — PREDNISONE 20 MG PO TABS: 40.0000 mg | ORAL_TABLET | Freq: Every day | ORAL | Status: DC

## 2014-01-07 NOTE — Progress Notes (Addendum)
Subjective:    Patient ID: Valerie [name], female    DOB: 12-16-1960, 53 y.o.   MRN: [id number]  HPI Chief Complaint  Patient presents with   Follow-up    with Dr. Carlean Jews.  pt was seen by nephrology yesterday.  also having problems with her right foot.     This chart was scribed for [name], MD by Thea Alken, ED Scribe. This patient was seen in room 2 and the patient's care was started at 5:29 PM.  HPI Comments: Valerie [name] is a 53 y.o. female who presents to the [facility] for a follow up. Pt reports she is doing much better from last visit. She reports her appetite is back and that she has loss about 6lb.   Pt [place] and now has worsening right foot swelling with tenderness. She has tried ibuprofen without relief to pain.   Pt was seen by nephrologist yesterday and is now requesting blood work.  Past Medical History  Diagnosis Date   Hypertension    Anxiety    Mental disorder    Depression    Sleep apnea    Headache(784.0)    Neuromuscular disorder    Nerve pain     nerve damage to right arm   Fibromyalgia    Arthritis     degenerative   Anemia 05/01/2012   Gastro-esophageal reflux    Sleep apnea     CPAP-9   Pancreatitis    Diarrhea     EPISODIC   Back pain     lower   Past Surgical History  Procedure Laterality Date   Cholecystectomy     Rotator cuff repair  bilateral   Nasal septoplasty w/ turbinoplasty     Esophagogastroduodenoscopy  12/21/2011    Procedure: ESOPHAGOGASTRODUODENOSCOPY (EGD);  Surgeon: Missy Sabins, MD;  Location: [facility] ENDOSCOPY;  Service: Endoscopy;  Laterality: N/A;   Gallbladder surgery     Flexible sigmoidoscopy  05/01/2012    Procedure: FLEXIBLE SIGMOIDOSCOPY;  Surgeon: Jeryl Columbia, MD;  Location: [facility] ENDOSCOPY;  Service: Endoscopy;  Laterality: N/A;  unprepped   Biceps tendon repair      13   Nasal sinus surgery      Esophagogastroduodenoscopy N/A 11/06/2012    Procedure: ESOPHAGOGASTRODUODENOSCOPY (EGD);  Surgeon: Missy Sabins, MD;  Location: [facility] ENDOSCOPY;  Service: Endoscopy;  Laterality: N/A;   Botox injection N/A 11/06/2012    Procedure: BOTOX INJECTION;  Surgeon: Missy Sabins, MD;  Location: [facility] ENDOSCOPY;  Service: Endoscopy;  Laterality: N/A;   Esophagogastroduodenoscopy N/A 12/04/2013    Procedure: ESOPHAGOGASTRODUODENOSCOPY (EGD);  Surgeon: Missy Sabins, MD;  Location: [facility] ENDOSCOPY;  Service: Endoscopy;  Laterality: N/A;   Botox injection N/A 12/04/2013    Procedure: BOTOX INJECTION;  Surgeon: Missy Sabins, MD;  Location: [place];  Service: Endoscopy;  Laterality: N/A;   Prior to Admission medications   Medication Sig Start Date End Date Taking? Authorizing Provider  albuterol (PROVENTIL HFA;VENTOLIN HFA) 108 (90 BASE) MCG/ACT inhaler Inhale 2 puffs into the lungs as needed. 07/05/12   Darlyne Russian, MD  allopurinol (ZYLOPRIM) 300 MG tablet TAKE 1/2 TABLET AS DIRECTED 11/07/13   [name], MD  ALPRAZolam Duanne Moron) 1 MG tablet Take 1 tablet (1 mg total) by mouth as needed. 10/08/13   Darlyne Russian, MD  baclofen (LIORESAL) 10 MG tablet Take 10 mg by mouth. PRN 04/13/13   [name], MD  Beclomethasone Dipropionate (QNASL) 80 MCG/ACT AERS Place 80 mcg into alternate nostrils 2 (two) times daily. 05/28/13   [name], MD  benazepril (LOTENSIN) 40 MG tablet TAKE 1 TABLET (40 MG TOTAL) BY MOUTH DAILY.    Mancel Bale, PA-C  cetirizine (ZYRTEC) 10 MG tablet Take 1 tablet (10 mg total) by mouth daily. 10/08/13   Darlyne Russian, MD  Cholecalciferol (VITAMIN D) 2000 UNITS tablet Take 2,000 Units by mouth daily.    Historical Provider, MD  furosemide (LASIX) 20 MG tablet One daily prn 11/07/13   [name], MD  HYDROCODONE-ACETAMINOPHEN PO Take 10-325 mg by mouth. One tablet daily as needed    Historical Provider, MD  ibuprofen (ADVIL,MOTRIN) 600 MG tablet Take 600 mg by mouth as needed.     Historical Provider, MD  lipase/protease/amylase (CREON-10/PANCREASE) 12000 UNITS CPEP Take 3 capsules by mouth 3 (three) times daily before meals. 24,000 UNITS    Historical Provider, MD  metoprolol succinate (TOPROL-XL) 100 MG 24 hr tablet Take 1 tablet (100 mg total) by mouth daily. 11/07/13   [name], MD  modafinil (PROVIGIL) 200 MG tablet Take 1 tablet (200 mg total) by mouth daily. 12/23/13   Carmen Dohmeier, MD  mometasone (NASONEX) 50 MCG/ACT nasal spray Place 2 sprays into the nose daily. 05/28/13   [name], MD  morphine (MS CONTIN) 30 MG 12 hr tablet Not from [facility] 09/23/12   Historical Provider, MD  Multiple Vitamin (MULTIVITAMIN) tablet Take 1 tablet by mouth daily.    Historical Provider, MD  Olopatadine HCl (PATADAY) 0.2 % SOLN Apply 1 drop to eye 2 (two) times daily. 05/28/13   [name], MD  omeprazole (PRILOSEC) 40 MG capsule Take 40 mg by mouth 2 (two) times daily.    Historical Provider, MD  ondansetron (ZOFRAN) 8 MG tablet Take 1 tablet (8 mg total) by mouth every 8 (eight) hours as needed for nausea. 01/29/13   [name], MD  promethazine (PHENERGAN) 25 MG tablet Take 25 mg by mouth every 6 (six) hours as needed for nausea or vomiting.    Historical Provider, MD  spironolactone (ALDACTONE) 100 MG tablet Take 1 tablet (100 mg total) by mouth daily. 11/07/13   [name], MD  Vitamin D, Ergocalciferol, (DRISDOL) 50000 UNITS CAPS Take 50,000 Units by mouth. 1 WK X'S 1 MO    Historical Provider, MD   Review of Systems  Musculoskeletal: Positive for arthralgias and myalgias. Negative for gait problem.       Objective:   Physical Exam  Nursing note and vitals reviewed. Constitutional: She is oriented to person, place, and time. She appears well-developed and well-nourished. No distress.  HENT:  Head: Normocephalic and atraumatic.  Eyes: Conjunctivae and EOM are normal.  Neck: Neck supple. No tracheal deviation present. No thyromegaly present.    Cardiovascular: Normal rate.   Pulmonary/Chest: Effort normal and breath sounds normal. No respiratory distress. She has no wheezes. She has no rales. She exhibits no tenderness.  Musculoskeletal: Normal range of motion.  Tender dorsolateral right foot without edema or ecchymosis or bony abnormality  Neurological: She is alert and oriented to person, place, and time.  Skin: Skin is warm and dry.  Psychiatric: She has a normal mood and affect. Her behavior is normal.   [facility] reading (PRIMARY) by  Dr. Joseph Art:  Right foot.  negative    Assessment & Plan:   [facility]. Katy Apo M.D [place], [place] [phone] Fax [phone]  Chronic kidney disease, stage III (moderate) - Plan: Basic metabolic panel, Protein / Creatinine Ratio, Urine  Anemia in chronic kidney disease(285.21) - Plan: POCT CBC  Other malaise and fatigue - Plan: C-reactive protein, ANA  Right foot pain - Plan: DG Foot Complete Right  Extensor tendonitis of foot  Signed, [name], MD

## 2014-01-08 LAB — BASIC METABOLIC PANEL
BUN: 33 mg/dL — ABNORMAL HIGH (ref 6–23)
CO2: 26 mEq/L (ref 19–32)
Calcium: 10 mg/dL (ref 8.4–10.5)
Chloride: 105 mEq/L (ref 96–112)
Creat: 1.68 mg/dL — ABNORMAL HIGH (ref 0.50–1.10)
Glucose, Bld: 87 mg/dL (ref 70–99)
Potassium: 4.9 mEq/L (ref 3.5–5.3)
Sodium: 140 mEq/L (ref 135–145)

## 2014-01-08 LAB — C-REACTIVE PROTEIN: CRP: 1.7 mg/dL — ABNORMAL HIGH (ref <0.60)

## 2014-01-08 LAB — PROTEIN / CREATININE RATIO, URINE
Creatinine, Urine: 204 mg/dL
Protein Creatinine Ratio: 0.02 (ref <0.15)
Total Protein, Urine: 4 mg/dL

## 2014-01-09 LAB — ANA: Anti Nuclear Antibody(ANA): NEGATIVE

## 2014-01-27 DIAGNOSIS — Z79899 Other long term (current) drug therapy: Secondary | ICD-10-CM | POA: Diagnosis not present

## 2014-01-27 DIAGNOSIS — M461 Sacroiliitis, not elsewhere classified: Secondary | ICD-10-CM | POA: Diagnosis not present

## 2014-01-27 DIAGNOSIS — Z9989 Dependence on other enabling machines and devices: Secondary | ICD-10-CM | POA: Diagnosis not present

## 2014-01-27 DIAGNOSIS — M79609 Pain in unspecified limb: Secondary | ICD-10-CM | POA: Diagnosis not present

## 2014-01-27 DIAGNOSIS — K859 Acute pancreatitis without necrosis or infection, unspecified: Secondary | ICD-10-CM | POA: Diagnosis not present

## 2014-01-27 DIAGNOSIS — M545 Low back pain, unspecified: Secondary | ICD-10-CM | POA: Diagnosis not present

## 2014-01-27 DIAGNOSIS — K3189 Other diseases of stomach and duodenum: Secondary | ICD-10-CM | POA: Diagnosis not present

## 2014-01-27 DIAGNOSIS — G4733 Obstructive sleep apnea (adult) (pediatric): Secondary | ICD-10-CM | POA: Diagnosis not present

## 2014-01-27 DIAGNOSIS — K219 Gastro-esophageal reflux disease without esophagitis: Secondary | ICD-10-CM | POA: Diagnosis not present

## 2014-01-27 DIAGNOSIS — F4310 Post-traumatic stress disorder, unspecified: Secondary | ICD-10-CM | POA: Diagnosis not present

## 2014-01-27 DIAGNOSIS — F3289 Other specified depressive episodes: Secondary | ICD-10-CM | POA: Diagnosis not present

## 2014-01-27 DIAGNOSIS — F332 Major depressive disorder, recurrent severe without psychotic features: Secondary | ICD-10-CM | POA: Diagnosis not present

## 2014-01-27 DIAGNOSIS — F329 Major depressive disorder, single episode, unspecified: Secondary | ICD-10-CM | POA: Diagnosis not present

## 2014-01-27 DIAGNOSIS — Z9889 Other specified postprocedural states: Secondary | ICD-10-CM | POA: Diagnosis not present

## 2014-01-27 DIAGNOSIS — E669 Obesity, unspecified: Secondary | ICD-10-CM | POA: Diagnosis not present

## 2014-01-31 ENCOUNTER — Ambulatory Visit (INDEPENDENT_AMBULATORY_CARE_PROVIDER_SITE_OTHER): Payer: Medicare Other

## 2014-01-31 ENCOUNTER — Ambulatory Visit (INDEPENDENT_AMBULATORY_CARE_PROVIDER_SITE_OTHER): Payer: Medicare Other | Admitting: Family Medicine

## 2014-01-31 VITALS — BP 122/80 | HR 118 | Temp 98.0°F | Resp 17 | Ht 64.5 in | Wt 289.0 lb

## 2014-01-31 DIAGNOSIS — T17908A Unspecified foreign body in respiratory tract, part unspecified causing other injury, initial encounter: Secondary | ICD-10-CM | POA: Diagnosis not present

## 2014-01-31 DIAGNOSIS — R05 Cough: Secondary | ICD-10-CM | POA: Diagnosis not present

## 2014-01-31 DIAGNOSIS — R059 Cough, unspecified: Secondary | ICD-10-CM

## 2014-01-31 LAB — POCT CBC
Granulocyte percent: 73 %G (ref 37–80)
HCT, POC: 35.3 % — AB (ref 37.7–47.9)
Hemoglobin: 11.4 g/dL — AB (ref 12.2–16.2)
Lymph, poc: 2.8 (ref 0.6–3.4)
MCH, POC: 30.3 pg (ref 27–31.2)
MCHC: 32.4 g/dL (ref 31.8–35.4)
MCV: 93.6 fL (ref 80–97)
MID (cbc): 0.9 (ref 0–0.9)
MPV: 7.7 fL (ref 0–99.8)
POC Granulocyte: 10 — AB (ref 2–6.9)
POC LYMPH PERCENT: 20.7 %L (ref 10–50)
POC MID %: 6.3 %M (ref 0–12)
Platelet Count, POC: 326 10*3/uL (ref 142–424)
RBC: 3.77 M/uL — AB (ref 4.04–5.48)
RDW, POC: 15.6 %
WBC: 13.7 10*3/uL — AB (ref 4.6–10.2)

## 2014-01-31 MED ORDER — CEFTRIAXONE SODIUM 1 G IJ SOLR
1.0000 g | Freq: Once | INTRAMUSCULAR | Status: AC
  Administered 2014-01-31: 1 g via INTRAMUSCULAR

## 2014-01-31 MED ORDER — ALBUTEROL SULFATE (2.5 MG/3ML) 0.083% IN NEBU
2.5000 mg | INHALATION_SOLUTION | Freq: Once | RESPIRATORY_TRACT | Status: AC
  Administered 2014-01-31: 2.5 mg via RESPIRATORY_TRACT

## 2014-01-31 MED ORDER — AMOXICILLIN-POT CLAVULANATE 875-125 MG PO TABS: 1.0000 | ORAL_TABLET | Freq: Two times a day (BID) | ORAL | Status: DC

## 2014-01-31 NOTE — Patient Instructions (Signed)
Recheck Tuesday evening.   Aspiration Pneumonia Aspiration pneumonia is an infection in your lungs. It occurs when food, liquid, or stomach contents (vomit) are inhaled (aspirated) into your lungs. When these things get into your lungs, swelling (inflammation) and infection can occur. This can make it difficult for you to breathe. Aspiration pneumonia is a serious condition and can be life threatening. RISK FACTORS Aspiration pneumonia is more likely to occur when a person's cough (gag) reflex or ability to swallow has been decreased. Some things that can do this include:   Having a brain injury or disease, such as stroke, seizures, Parkinson's disease, dementia, or amyotrophic lateral sclerosis (ALS).   Being given general anesthetic for procedures.   Being in a coma (unconscious).   Having a narrowing of the tube that carries food to the stomach (esophagus).   Drinking too much alcohol. If a person passes out and vomits, vomit can be swallowed into the lungs.   Taking certain medicines, such as tranquilizers or sedatives.  SIGNS AND SYMPTOMS   Coughing after swallowing food or liquids.   Breathing problems, such as wheezing or shortness of breath.   Bluish skin. This can be caused by lack of oxygen.   Coughing up food or mucus. The mucus might contain blood, greenish material, or yellowish-white fluid (pus).   Fever.   Chest pain.   Being more tired than usual (fatigue).   Sweating more than usual.   Bad breath.  DIAGNOSIS  A physical exam will be done. During the exam, the health care provider will listen to your lungs with a stethoscope to check for:   Crackling sounds in the lungs.  Decreased breath sounds.  A rapid heartbeat. Various tests may be ordered. These may include:   Chest X-ray.   CT scan.   Swallowing study. This test looks at how food is swallowed and whether it goes into your breathing tube (trachea) or food pipe (esophagus).    Sputum culture. Saliva and mucus (sputum) are collected from the lungs or the tubes that carry air to the lungs (bronchi). The sputum is then tested for bacteria.   Bronchoscopy. This test uses a flexible tube (bronchoscope) to see inside the lungs. TREATMENT  Treatment will usually include antibiotic medicines. Other medicines may also be used to reduce fever or pain. You may need to be treated in the hospital. In the hospital, your breathing will be carefully monitored. Depending on how well you are breathing, you may need to be given oxygen, or you may need breathing support from a breathing machine (ventilator). For people who fail a swallowing study, a feeding tube might be placed in the stomach, or they may be asked to avoid certain food textures or liquids when they eat. HOME CARE INSTRUCTIONS   Carefully follow any special eating instructions you were given, such as avoiding certain food textures or thickening liquids. This reduces the risk of developing aspiration pneumonia again.  Only take over-the-counter or prescription medicines as directed by your health care provider. Follow the directions carefully.   If you were prescribed antibiotics, take them as directed. Finish them even if you start to feel better.   Rest as instructed by your health care provider.   Keep all follow-up appointments with your health care provider.  SEEK MEDICAL CARE IF:   You develop worsening shortness of breath, wheezing, or difficulty breathing.   You develop a fever.   You have chest pain.  MAKE SURE YOU:   Understand these  instructions.  Will watch your condition.  Will get help right away if you are not doing well or get worse. Document Released: 03/26/2009 Document Revised: 06/03/2013 Document Reviewed: 11/14/2012 [facility] Patient Information 2015 [place], [place]. This information is not intended to replace advice given to you by your health care provider. Make sure you  discuss any questions you have with your health care provider.

## 2014-01-31 NOTE — Progress Notes (Signed)
Patient ID: Valerie [name] MRN: [id number], DOB: [birth year], 53 y.o. Date of Encounter: 01/31/2014, 11:43 AM  Primary Physician: [name], MD  Chief Complaint:  Chief Complaint  Patient presents with  . URI    congestion in lungs patient states she aspirated something     HPI: 53 y.o. year old female presents with a 1 day history of aspiration. She was lying in a recliner at 2:30 this morning having dozed off from some computer work when she aspirated a large amount of fluid. She's been coughing and short breath since.  No sick contacts, recent antibiotics, or recent travels.   No leg trauma, sedentary periods, h/o cancer, or tobacco use.  Past Medical History  Diagnosis Date  . Hypertension   . Anxiety   . Mental disorder   . Depression   . Sleep apnea   . Headache(784.0)   . Neuromuscular disorder   . 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 Fibromyalgia   . Arthritis     degenerative  . Anemia 05/01/2012  . Gastro-esophageal reflux   . Sleep apnea     CPAP-9  . Pancreatitis   . Diarrhea     EPISODIC  . Back pain     lower     Home Meds: Prior to Admission medications   Medication Sig Start Date End Date Taking? Authorizing Provider  albuterol (PROVENTIL HFA;VENTOLIN HFA) 108 (90 BASE) MCG/ACT inhaler Inhale 2 puffs into the lungs as needed. 07/05/12  Yes Darlyne Russian, MD  allopurinol (ZYLOPRIM) 300 MG tablet TAKE 1/2 TABLET AS DIRECTED 11/07/13  Yes [name], MD  ALPRAZolam Duanne Moron) 1 MG tablet Take 1 tablet (1 mg total) by mouth as needed. 10/08/13  Yes Darlyne Russian, MD  baclofen (LIORESAL) 10 MG tablet Take 10 mg by mouth. PRN 04/13/13  Yes [name], MD  Beclomethasone Dipropionate (QNASL) 80 MCG/ACT AERS Place 80 mcg into alternate nostrils 2 (two) times daily. 05/28/13  Yes [name], MD  benazepril (LOTENSIN) 40 MG tablet TAKE 1 TABLET (40 MG TOTAL) BY MOUTH DAILY.   Yes Mancel Bale, PA-C  cetirizine (ZYRTEC) 10 MG tablet  Take 1 tablet (10 mg total) by mouth daily. 10/08/13  Yes Darlyne Russian, MD  Cholecalciferol (VITAMIN D) 2000 UNITS tablet Take 2,000 Units by mouth daily.   Yes Historical Provider, MD  furosemide (LASIX) 20 MG tablet One daily prn 11/07/13  Yes [name], MD  HYDROCODONE-ACETAMINOPHEN PO Take 10-325 mg by mouth. One tablet daily as needed   Yes Historical Provider, MD  ibuprofen (ADVIL,MOTRIN) 600 MG tablet Take 600 mg by mouth as needed.   Yes Historical Provider, MD  lipase/protease/amylase (CREON-10/PANCREASE) 12000 UNITS CPEP Take 3 capsules by mouth 3 (three) times daily before meals. 24,000 UNITS   Yes Historical Provider, MD  metoprolol succinate (TOPROL-XL) 100 MG 24 hr tablet Take 1 tablet (100 mg total) by mouth daily. 11/07/13  Yes [name], MD  modafinil (PROVIGIL) 200 MG tablet Take 1 tablet (200 mg total) by mouth daily. 12/23/13  Yes Carmen Dohmeier, MD  mometasone (NASONEX) 50 MCG/ACT nasal spray Place 2 sprays into the nose daily. 05/28/13  Yes [name], MD  morphine (MS CONTIN) 30 MG 12 hr tablet Not from [facility] 09/23/12  Yes Historical Provider, MD  Multiple Vitamin (MULTIVITAMIN) tablet Take 1 tablet by mouth daily.   Yes Historical Provider, MD  Olopatadine HCl (PATADAY) 0.2 % SOLN Apply 1 drop  to eye 2 (two) times daily. 05/28/13  Yes [name], MD  omeprazole (PRILOSEC) 40 MG capsule Take 40 mg by mouth 2 (two) times daily.   Yes Historical Provider, MD  ondansetron (ZOFRAN) 8 MG tablet Take 1 tablet (8 mg total) by mouth every 8 (eight) hours as needed for nausea. 01/29/13  Yes [name], MD  predniSONE (DELTASONE) 20 MG tablet Take 2 tablets (40 mg total) by mouth daily. 01/07/14  Yes [name], MD  promethazine (PHENERGAN) 25 MG tablet Take 25 mg by mouth every 6 (six) hours as needed for nausea or vomiting.   Yes Historical Provider, MD  spironolactone (ALDACTONE) 100 MG tablet Take 1 tablet (100 mg total) by mouth daily. 11/07/13  Yes [name], MD  Vitamin D, Ergocalciferol, (DRISDOL) 50000 UNITS CAPS Take 50,000 Units by mouth. 1 WK X'S 1 MO   Yes Historical Provider, MD    Allergies:  Allergies  Allergen Reactions  . Dilaudid [Hydromorphone Hcl] Other (See Comments)    Headache   . Pregabalin   . Metoclopramide Hcl Other (See Comments)    "crawling inside"  . Neurontin [Gabapentin] Other (See Comments)    Tremors   . Other Other (See Comments)    Avoid all SSRI's and SNRI's- damaged pancreas  . Reglan [Metoclopramide]     History   Social History  . Marital Status: Single    Spouse Name: N/A    Number of Children: 0  . Years of Education: College   Occupational History  . disabled    Social History Main Topics  . Smoking status: Never Smoker   . Smokeless tobacco: Never Used  . Alcohol Use: No  . Drug Use: No  . Sexual Activity: Yes   Other Topics Concern  . Not on file   Social History Narrative  . No narrative on file     Review of Systems:  Constitutional: negative for chills, fever, night sweats or weight changes Cardiovascular: negative for chest pain or palpitations Respiratory: negative for hemoptysis, wheezing, or shortness of breath Abdominal: negative for abdominal pain, nausea, vomiting or diarrhea Dermatological: negative for rash Neurologic: negative for headache   Physical Exam: \ Blood pressure 122/80, pulse 118, temperature 98 F (36.7 C), temperature source Oral, resp. rate 17, height 5' 4.5" (1.638 m), weight 289 lb (131.09 kg), SpO2 98.00%., Body mass index is 48.86 kg/(m^2). General: Well developed, well nourished, in no acute distress. Head: Normocephalic, atraumatic, eyes without discharge, sclera non-icteric, nares are congested. Bilateral auditory canals clear, TM's are without perforation, pearly grey with reflective cone of light bilaterally. No sinus TTP. Oral cavity moist, dentition normal. Posterior pharynx with post nasal drip and mild erythema. No  peritonsillar abscess or tonsillar exudate. Neck: Supple. No thyromegaly. Full ROM. No lymphadenopathy. Lungs: Coarse rhonchi bilaterally, worse on the left with expiratory wheezes. Patient is obviously very congested and tearful. Heart: RRR with S1 S2. No murmurs, rubs, or gallops appreciated. Msk:  Strength and tone normal for age. Extremities: No clubbing or cyanosis. No edema. Neuro: Alert and oriented X 3. Moves all extremities spontaneously. CNII-XII grossly in tact. Psych:  Responds to questions appropriately with a normal affect.   Labs: Results for orders placed in visit on 01/31/14  POCT CBC      Result Value Ref Range   WBC 13.7 (*) 4.6 - 10.2 K/uL   Lymph, poc 2.8  0.6 - 3.4   POC LYMPH PERCENT 20.7  10 - 50 %L  MID (cbc) 0.9  0 - 0.9   POC MID % 6.3  0 - 12 %M   POC Granulocyte 10.0 (*) 2 - 6.9   Granulocyte percent 73.0  37 - 80 %G   RBC 3.77 (*) 4.04 - 5.48 M/uL   Hemoglobin 11.4 (*) 12.2 - 16.2 g/dL   HCT, POC 35.3 (*) 37.7 - 47.9 %   MCV 93.6  80 - 97 fL   MCH, POC 30.3  27 - 31.2 pg   MCHC 32.4  31.8 - 35.4 g/dL   RDW, POC 15.6     Platelet Count, POC 326  142 - 424 K/uL   MPV 7.7  0 - 99.8 fL    [facility] reading (PRIMARY) by  Dr. Joseph Art CXR-->small amount of atelectasis LLL.    ASSESSMENT AND PLAN:  53 y.o. year old female with aspiration bronchitis. -Aspiration into airway, initial encounter - Plan: POCT CBC, DG Chest 2 View, albuterol (PROVENTIL) (2.5 MG/3ML) 0.083% nebulizer solution 2.5 mg, amoxicillin-clavulanate (AUGMENTIN) 875-125 MG per tablet  Cough - Plan: POCT CBC, DG Chest 2 View, albuterol (PROVENTIL) (2.5 MG/3ML) 0.083% nebulizer solution 2.5 mg, amoxicillin-clavulanate (AUGMENTIN) 875-125 MG per tablet  -Tylenol/Motrin prn -Rest/fluids -RTC precautions -RTC 3 days  Signed, [name], MD 01/31/2014 11:43 AM

## 2014-02-03 ENCOUNTER — Emergency Department (HOSPITAL_COMMUNITY): Payer: Medicare Other

## 2014-02-03 ENCOUNTER — Encounter: Payer: Self-pay | Admitting: Family Medicine

## 2014-02-03 ENCOUNTER — Encounter (HOSPITAL_COMMUNITY): Payer: Self-pay | Admitting: Emergency Medicine

## 2014-02-03 ENCOUNTER — Ambulatory Visit (INDEPENDENT_AMBULATORY_CARE_PROVIDER_SITE_OTHER): Payer: Medicare Other | Admitting: Family Medicine

## 2014-02-03 ENCOUNTER — Emergency Department (HOSPITAL_COMMUNITY)
Admission: EM | Admit: 2014-02-03 | Discharge: 2014-02-04 | Disposition: A | Discharge status: Home / Self Care | Payer: Medicare Other | Attending: Emergency Medicine | Admitting: Emergency Medicine

## 2014-02-03 VITALS — BP 120/80 | HR 102 | Temp 97.8°F | Resp 18 | Ht 64.0 in | Wt 280.2 lb

## 2014-02-03 DIAGNOSIS — R6883 Chills (without fever): Secondary | ICD-10-CM | POA: Diagnosis not present

## 2014-02-03 DIAGNOSIS — D485 Neoplasm of uncertain behavior of skin: Secondary | ICD-10-CM | POA: Insufficient documentation

## 2014-02-03 DIAGNOSIS — Z79899 Other long term (current) drug therapy: Secondary | ICD-10-CM | POA: Insufficient documentation

## 2014-02-03 DIAGNOSIS — R0600 Dyspnea, unspecified: Secondary | ICD-10-CM

## 2014-02-03 DIAGNOSIS — R5381 Other malaise: Secondary | ICD-10-CM | POA: Insufficient documentation

## 2014-02-03 DIAGNOSIS — J690 Pneumonitis due to inhalation of food and vomit: Secondary | ICD-10-CM

## 2014-02-03 DIAGNOSIS — I10 Essential (primary) hypertension: Secondary | ICD-10-CM | POA: Insufficient documentation

## 2014-02-03 DIAGNOSIS — R05 Cough: Secondary | ICD-10-CM | POA: Diagnosis not present

## 2014-02-03 DIAGNOSIS — J9819 Other pulmonary collapse: Secondary | ICD-10-CM | POA: Diagnosis not present

## 2014-02-03 DIAGNOSIS — R112 Nausea with vomiting, unspecified: Secondary | ICD-10-CM | POA: Insufficient documentation

## 2014-02-03 DIAGNOSIS — J4520 Mild intermittent asthma, uncomplicated: Secondary | ICD-10-CM

## 2014-02-03 DIAGNOSIS — J45909 Unspecified asthma, uncomplicated: Secondary | ICD-10-CM | POA: Diagnosis not present

## 2014-02-03 DIAGNOSIS — K219 Gastro-esophageal reflux disease without esophagitis: Secondary | ICD-10-CM | POA: Diagnosis not present

## 2014-02-03 DIAGNOSIS — R059 Cough, unspecified: Secondary | ICD-10-CM | POA: Diagnosis not present

## 2014-02-03 DIAGNOSIS — G4730 Sleep apnea, unspecified: Secondary | ICD-10-CM | POA: Diagnosis not present

## 2014-02-03 DIAGNOSIS — R197 Diarrhea, unspecified: Secondary | ICD-10-CM | POA: Diagnosis not present

## 2014-02-03 DIAGNOSIS — M129 Arthropathy, unspecified: Secondary | ICD-10-CM | POA: Insufficient documentation

## 2014-02-03 DIAGNOSIS — F411 Generalized anxiety disorder: Secondary | ICD-10-CM | POA: Diagnosis not present

## 2014-02-03 DIAGNOSIS — R61 Generalized hyperhidrosis: Secondary | ICD-10-CM | POA: Insufficient documentation

## 2014-02-03 DIAGNOSIS — R000 Tachycardia, unspecified: Secondary | ICD-10-CM | POA: Insufficient documentation

## 2014-02-03 DIAGNOSIS — Z8669 Personal history of other diseases of the nervous system and sense organs: Secondary | ICD-10-CM | POA: Insufficient documentation

## 2014-02-03 DIAGNOSIS — R0989 Other specified symptoms and signs involving the circulatory and respiratory systems: Secondary | ICD-10-CM | POA: Diagnosis not present

## 2014-02-03 DIAGNOSIS — F3289 Other specified depressive episodes: Secondary | ICD-10-CM | POA: Insufficient documentation

## 2014-02-03 DIAGNOSIS — Z792 Long term (current) use of antibiotics: Secondary | ICD-10-CM | POA: Diagnosis not present

## 2014-02-03 DIAGNOSIS — F329 Major depressive disorder, single episode, unspecified: Secondary | ICD-10-CM | POA: Insufficient documentation

## 2014-02-03 DIAGNOSIS — R5383 Other fatigue: Secondary | ICD-10-CM | POA: Diagnosis not present

## 2014-02-03 DIAGNOSIS — R0609 Other forms of dyspnea: Secondary | ICD-10-CM | POA: Insufficient documentation

## 2014-02-03 DIAGNOSIS — Z862 Personal history of diseases of the blood and blood-forming organs and certain disorders involving the immune mechanism: Secondary | ICD-10-CM | POA: Diagnosis not present

## 2014-02-03 LAB — CBC WITH DIFFERENTIAL/PLATELET
Basophils Absolute: 0 10*3/uL (ref 0.0–0.1)
Basophils Relative: 0 % (ref 0–1)
EOS ABS: 0 10*3/uL (ref 0.0–0.7)
Eosinophils Relative: 0 % (ref 0–5)
HCT: 38.7 % (ref 36.0–46.0)
Hemoglobin: 12.8 g/dL (ref 12.0–15.0)
LYMPHS ABS: 1.6 10*3/uL (ref 0.7–4.0)
LYMPHS PCT: 12 % (ref 12–46)
MCH: 30.7 pg (ref 26.0–34.0)
MCHC: 33.1 g/dL (ref 30.0–36.0)
MCV: 92.8 fL (ref 78.0–100.0)
Monocytes Absolute: 0.5 10*3/uL (ref 0.1–1.0)
Monocytes Relative: 4 % (ref 3–12)
NEUTROS PCT: 84 % — AB (ref 43–77)
Neutro Abs: 11.2 10*3/uL — ABNORMAL HIGH (ref 1.7–7.7)
PLATELETS: 394 10*3/uL (ref 150–400)
RBC: 4.17 MIL/uL (ref 3.87–5.11)
RDW: 13.8 % (ref 11.5–15.5)
WBC: 13.3 10*3/uL — ABNORMAL HIGH (ref 4.0–10.5)

## 2014-02-03 LAB — URINALYSIS, ROUTINE W REFLEX MICROSCOPIC
Bilirubin Urine: NEGATIVE
Glucose, UA: NEGATIVE mg/dL
Hgb urine dipstick: NEGATIVE
Ketones, ur: NEGATIVE mg/dL
LEUKOCYTES UA: NEGATIVE
NITRITE: NEGATIVE
PROTEIN: NEGATIVE mg/dL
Specific Gravity, Urine: 1.013 (ref 1.005–1.030)
Urobilinogen, UA: 0.2 mg/dL (ref 0.0–1.0)
pH: 6 (ref 5.0–8.0)

## 2014-02-03 LAB — POCT CBC
Granulocyte percent: 74.6 %G (ref 37–80)
HCT, POC: 40.5 % (ref 37.7–47.9)
Hemoglobin: 12.6 g/dL (ref 12.2–16.2)
Lymph, poc: 2.4 (ref 0.6–3.4)
MCH, POC: 29.1 pg (ref 27–31.2)
MCHC: 31.2 g/dL — AB (ref 31.8–35.4)
MCV: 93.5 fL (ref 80–97)
MID (cbc): 0.5 (ref 0–0.9)
MPV: 8.4 fL (ref 0–99.8)
POC Granulocyte: 8.7 — AB (ref 2–6.9)
POC LYMPH PERCENT: 20.8 %L (ref 10–50)
POC MID %: 4.6 %M (ref 0–12)
Platelet Count, POC: 401 10*3/uL (ref 142–424)
RBC: 4.33 M/uL (ref 4.04–5.48)
RDW, POC: 15.1 %
WBC: 11.6 10*3/uL — AB (ref 4.6–10.2)

## 2014-02-03 LAB — BASIC METABOLIC PANEL
Anion gap: 17 — ABNORMAL HIGH (ref 5–15)
BUN: 18 mg/dL (ref 6–23)
CO2: 21 meq/L (ref 19–32)
Calcium: 10.4 mg/dL (ref 8.4–10.5)
Chloride: 105 mEq/L (ref 96–112)
Creatinine, Ser: 1.33 mg/dL — ABNORMAL HIGH (ref 0.50–1.10)
GFR calc Af Amer: 53 mL/min — ABNORMAL LOW (ref >90)
GFR, EST NON AFRICAN AMERICAN: 45 mL/min — AB (ref >90)
Glucose, Bld: 143 mg/dL — ABNORMAL HIGH (ref 70–99)
POTASSIUM: 4.4 meq/L (ref 3.7–5.3)
SODIUM: 143 meq/L (ref 137–147)

## 2014-02-03 LAB — D-DIMER, QUANTITATIVE (NOT AT ARMC): D-Dimer, Quant: 0.78 ug/mL-FEU — ABNORMAL HIGH (ref 0.00–0.48)

## 2014-02-03 MED ORDER — MORPHINE SULFATE 4 MG/ML IJ SOLN
4.0000 mg | Freq: Once | INTRAMUSCULAR | Status: AC
  Administered 2014-02-03: 4 mg via INTRAVENOUS
  Filled 2014-02-03: qty 1

## 2014-02-03 MED ORDER — ALBUTEROL SULFATE HFA 108 (90 BASE) MCG/ACT IN AERS: 2.0000 | INHALATION_SPRAY | RESPIRATORY_TRACT | Status: DC | PRN

## 2014-02-03 MED ORDER — SODIUM CHLORIDE 0.9 % IV BOLUS (SEPSIS)
1000.0000 mL | Freq: Once | INTRAVENOUS | Status: AC
  Administered 2014-02-03: 1000 mL via INTRAVENOUS

## 2014-02-03 MED ORDER — ALBUTEROL SULFATE (2.5 MG/3ML) 0.083% IN NEBU
2.5000 mg | INHALATION_SOLUTION | Freq: Once | RESPIRATORY_TRACT | Status: AC
  Administered 2014-02-03: 2.5 mg via RESPIRATORY_TRACT

## 2014-02-03 MED ORDER — IOHEXOL 350 MG/ML SOLN
100.0000 mL | Freq: Once | INTRAVENOUS | Status: AC | PRN
  Administered 2014-02-03: 100 mL via INTRAVENOUS

## 2014-02-03 MED ORDER — HYDROCODONE-ACETAMINOPHEN 5-325 MG PO TABS
2.0000 | ORAL_TABLET | Freq: Once | ORAL | Status: AC
  Administered 2014-02-03: 2 via ORAL
  Filled 2014-02-03: qty 2

## 2014-02-03 MED ORDER — ONDANSETRON HCL 4 MG/2ML IJ SOLN
4.0000 mg | Freq: Once | INTRAMUSCULAR | Status: AC
  Administered 2014-02-03: 4 mg via INTRAVENOUS
  Filled 2014-02-03: qty 2

## 2014-02-03 NOTE — Progress Notes (Signed)
Patient ID: LELAR FAREWELL MRN: [id number], DOB: 16-Dec-1960, 53 y.o. Date of Encounter: 02/03/2014, 5:09 PM  Primary Physician: [name], MD  Chief Complaint:  Chief Complaint  Patient presents with  . Follow-up    pt is here for follow-up regarding being diagnosed with pneuomina; pt states she is not doing any better; she feels worse  . Fever  . Chills  . Fatigue    HPI: 53 y.o. year old female presents with a 3 day history of cough after early am aspiration while sitting on recliner.  She has been diaphoretic with rhinorrhea.  Cough is minimally productive.  Patient feels horrible.  She continues to feel short of breath.  Unable to sleep because of diaphoresis and chills.  She has developed diarrhea after starting the antibiotic.  No sick contacts or recent travels.   No leg trauma, sedentary periods, h/o cancer, or tobacco use.  Past Medical History  Diagnosis Date  . Hypertension   . Anxiety   . Mental disorder   . Depression   . Sleep apnea   . Headache(784.0)   . Neuromuscular disorder   . 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 Fibromyalgia   . Arthritis     degenerative  . Anemia 05/01/2012  . Gastro-esophageal reflux   . Sleep apnea     CPAP-9  . Pancreatitis   . Diarrhea     EPISODIC  . Back pain     lower     Home Meds: Prior to Admission medications   Medication Sig Start Date End Date Taking? Authorizing Provider  albuterol (PROVENTIL HFA;VENTOLIN HFA) 108 (90 BASE) MCG/ACT inhaler Inhale 2 puffs into the lungs as needed. 07/05/12  Yes Darlyne Russian, MD  allopurinol (ZYLOPRIM) 300 MG tablet TAKE 1/2 TABLET AS DIRECTED 11/07/13  Yes [name], MD  ALPRAZolam Duanne Moron) 1 MG tablet Take 1 tablet (1 mg total) by mouth as needed. 10/08/13  Yes Darlyne Russian, MD  amoxicillin-clavulanate (AUGMENTIN) 875-125 MG per tablet Take 1 tablet by mouth 2 (two) times daily. 01/31/14  Yes [name], MD  baclofen (LIORESAL) 10 MG tablet Take 10  mg by mouth. PRN 04/13/13  Yes [name], MD  Beclomethasone Dipropionate (QNASL) 80 MCG/ACT AERS Place 80 mcg into alternate nostrils 2 (two) times daily. 05/28/13  Yes [name], MD  benazepril (LOTENSIN) 40 MG tablet TAKE 1 TABLET (40 MG TOTAL) BY MOUTH DAILY.   Yes Mancel Bale, PA-C  cetirizine (ZYRTEC) 10 MG tablet Take 1 tablet (10 mg total) by mouth daily. 10/08/13  Yes Darlyne Russian, MD  Cholecalciferol (VITAMIN D) 2000 UNITS tablet Take 2,000 Units by mouth daily.   Yes Historical Provider, MD  furosemide (LASIX) 20 MG tablet One daily prn 11/07/13  Yes [name], MD  HYDROCODONE-ACETAMINOPHEN PO Take 10-325 mg by mouth. One tablet daily as needed   Yes Historical Provider, MD  ibuprofen (ADVIL,MOTRIN) 600 MG tablet Take 600 mg by mouth as needed.   Yes Historical Provider, MD  lipase/protease/amylase (CREON-10/PANCREASE) 12000 UNITS CPEP Take 3 capsules by mouth 3 (three) times daily before meals. 24,000 UNITS   Yes Historical Provider, MD  metoprolol succinate (TOPROL-XL) 100 MG 24 hr tablet Take 1 tablet (100 mg total) by mouth daily. 11/07/13  Yes [name], MD  modafinil (PROVIGIL) 200 MG tablet Take 1 tablet (200 mg total) by mouth daily. 12/23/13  Yes Carmen Dohmeier, MD  mometasone (NASONEX) 50 MCG/ACT nasal  spray Place 2 sprays into the nose daily. 05/28/13  Yes [name], MD  morphine (MS CONTIN) 30 MG 12 hr tablet Not from [facility] 09/23/12  Yes Historical Provider, MD  Multiple Vitamin (MULTIVITAMIN) tablet Take 1 tablet by mouth daily.   Yes Historical Provider, MD  Olopatadine HCl (PATADAY) 0.2 % SOLN Apply 1 drop to eye 2 (two) times daily. 05/28/13  Yes [name], MD  omeprazole (PRILOSEC) 40 MG capsule Take 40 mg by mouth 2 (two) times daily.   Yes Historical Provider, MD  ondansetron (ZOFRAN) 8 MG tablet Take 1 tablet (8 mg total) by mouth every 8 (eight) hours as needed for nausea. 01/29/13  Yes [name], MD  predniSONE (DELTASONE) 20 MG  tablet Take 2 tablets (40 mg total) by mouth daily. 01/07/14  Yes [name], MD  promethazine (PHENERGAN) 25 MG tablet Take 25 mg by mouth every 6 (six) hours as needed for nausea or vomiting.   Yes Historical Provider, MD  spironolactone (ALDACTONE) 100 MG tablet Take 1 tablet (100 mg total) by mouth daily. 11/07/13  Yes [name], MD  Vitamin D, Ergocalciferol, (DRISDOL) 50000 UNITS CAPS Take 50,000 Units by mouth. 1 WK X'S 1 MO   Yes Historical Provider, MD    Allergies:  Allergies  Allergen Reactions  . Dilaudid [Hydromorphone Hcl] Other (See Comments)    Headache   . Pregabalin   . Metoclopramide Hcl Other (See Comments)    "crawling inside"  . Neurontin [Gabapentin] Other (See Comments)    Tremors   . Other Other (See Comments)    Avoid all SSRI's and SNRI's- damaged pancreas  . Reglan [Metoclopramide]     History   Social History  . Marital Status: Single    Spouse Name: N/A    Number of Children: 0  . Years of Education: College   Occupational History  . disabled    Social History Main Topics  . Smoking status: Never Smoker   . Smokeless tobacco: Never Used  . Alcohol Use: No  . Drug Use: No  . Sexual Activity: Yes   Other Topics Concern  . Not on file   Social History Narrative  . No narrative on file     Review of Systems: Constitutional: negative for chills, fever, night sweats or weight changes Cardiovascular: negative for chest pain or palpitations Respiratory: negative for hemoptysis Abdominal: negative for abdominal pain, nausea, vomiting  Dermatological: negative for rash Neurologic: negative for headache   Physical Exam:  Crying, diaphoretic, and tachypneic Blood pressure 120/80, pulse 102, temperature 97.8 F (36.6 C), temperature source Oral, resp. rate 18, height 5\' 4"  (1.626 m), weight 280 lb 3.2 oz (127.098 kg), SpO2 100.00%., Body mass index is 48.07 kg/(m^2). General: Well developed, well nourished, in no acute  distress. Head: Normocephalic, atraumatic, eyes without discharge, sclera non-icteric, nares are congested. Bilateral auditory canals clear, TM's are without perforation, pearly grey with reflective cone of light bilaterally. No sinus TTP. Oral cavity moist, dentition normal. Posterior pharynx with post nasal drip and mild erythema. No peritonsillar abscess or tonsillar exudate. Neck: Supple. No thyromegaly. Full ROM. No lymphadenopathy. Lungs: Coarse breath sounds bilaterally with wheezes, but no rales, or rhonchi. Breathing is labored.  Heart: RRR with S1 S2. No murmurs, rubs, or gallops appreciated. Msk:  Strength and tone normal for age. Extremities: No clubbing or cyanosis. No edema. Neuro: Alert and oriented X 3. Moves all extremities spontaneously. CNII-XII grossly in tact. Psych:  Responds to questions appropriately with  a normal affect.   Results for orders placed in visit on 02/03/14  POCT CBC      Result Value Ref Range   WBC 11.6 (*) 4.6 - 10.2 K/uL   Lymph, poc 2.4  0.6 - 3.4   POC LYMPH PERCENT 20.8  10 - 50 %L   MID (cbc) 0.5  0 - 0.9   POC MID % 4.6  0 - 12 %M   POC Granulocyte 8.7 (*) 2 - 6.9   Granulocyte percent 74.6  37 - 80 %G   RBC 4.33  4.04 - 5.48 M/uL   Hemoglobin 12.6  12.2 - 16.2 g/dL   HCT, POC 40.5  37.7 - 47.9 %   MCV 93.5  80 - 97 fL   MCH, POC 29.1  27 - 31.2 pg   MCHC 31.2 (*) 31.8 - 35.4 g/dL   RDW, POC 15.1     Platelet Count, POC 401  142 - 424 K/uL   MPV 8.4  0 - 99.8 fL    ASSESSMENT AND PLAN:  53 y.o. year old female with recent aspiration and persistent respiratory distress.  She is obviously upset and in distress, and has not responded to the outpatient and clinic measures we have employed.  At this point, I would like her to go to ED and get further evaluation and support.  Signed, [name], MD 02/03/2014 5:09 PM

## 2014-02-03 NOTE — ED Provider Notes (Signed)
CSN: [id number]     Arrival date & time 02/03/14  1816 History   First MD Initiated Contact with Patient 02/03/14 1924     Chief Complaint  Patient presents with  . Pneumonia  . Chills  . Excessive Sweating     (Consider location/radiation/quality/duration/timing/severity/associated sxs/prior Treatment) Patient is a 53 y.o. female presenting with general illness.  Illness Location:  Generalized, chest Quality:  Malaise, dyspnea, diaphoresis Severity:  Moderate Onset quality:  Gradual Duration:  1 week Timing:  Constant Progression:  Worsening Chronicity:  New Context:  Treated for PNA with course of augmentin Relieved by:  Nothing Worsened by:  Nothing Associated symptoms: cough, diarrhea, fever (subj, no measured fevers since abx), nausea and vomiting   Associated symptoms: no abdominal pain, no chest pain, no congestion, no loss of consciousness, no rash, no rhinorrhea, no shortness of breath and no sore throat     Past Medical History  Diagnosis Date  . Hypertension   . Anxiety   . Mental disorder   . Depression   . Sleep apnea   . Headache(784.0)   . Neuromuscular disorder   . 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 Fibromyalgia   . Arthritis     degenerative  . Anemia 05/01/2012  . Gastro-esophageal reflux   . Sleep apnea     CPAP-9  . Pancreatitis   . Diarrhea     EPISODIC  . Back pain     lower   Past Surgical History  Procedure Laterality Date  . Cholecystectomy    . Rotator cuff repair  bilateral  . Nasal septoplasty w/ turbinoplasty    . Esophagogastroduodenoscopy  12/21/2011    Procedure: ESOPHAGOGASTRODUODENOSCOPY (EGD);  Surgeon: Missy Sabins, MD;  Location: [facility] ENDOSCOPY;  Service: Endoscopy;  Laterality: N/A;  . Gallbladder surgery    . Flexible sigmoidoscopy  05/01/2012    Procedure: FLEXIBLE SIGMOIDOSCOPY;  Surgeon: Jeryl Columbia, MD;  Location: [facility] ENDOSCOPY;  Service: Endoscopy;  Laterality: N/A;  unprepped  . Biceps tendon repair       13  . Nasal sinus surgery    . Esophagogastroduodenoscopy N/A 11/06/2012    Procedure: ESOPHAGOGASTRODUODENOSCOPY (EGD);  Surgeon: Missy Sabins, MD;  Location: [facility] ENDOSCOPY;  Service: Endoscopy;  Laterality: N/A;  . Botox injection N/A 11/06/2012    Procedure: BOTOX INJECTION;  Surgeon: Missy Sabins, MD;  Location: [facility] ENDOSCOPY;  Service: Endoscopy;  Laterality: N/A;  . Esophagogastroduodenoscopy N/A 12/04/2013    Procedure: ESOPHAGOGASTRODUODENOSCOPY (EGD);  Surgeon: Missy Sabins, MD;  Location: [facility] ENDOSCOPY;  Service: Endoscopy;  Laterality: N/A;  . Botox injection N/A 12/04/2013    Procedure: BOTOX INJECTION;  Surgeon: Missy Sabins, MD;  Location: [place];  Service: Endoscopy;  Laterality: N/A;   Family History  Problem Relation Age of Onset  . Heart disease Mother   . COPD Mother   . Arthritis Mother   . Cancer Mother   . Hypertension Mother   . Arthritis Paternal Grandmother   . Heart disease Paternal Grandmother   . Heart disease Paternal Grandfather    History  Substance Use Topics  . Smoking status: Never Smoker   . Smokeless tobacco: Never Used  . Alcohol Use: No   OB History   Grav Para Term Preterm Abortions TAB SAB Ect Mult Living                 Review of Systems  Constitutional: Positive for fever (subj,  no measured fevers since abx). Negative for chills.  HENT: Negative for congestion, rhinorrhea and sore throat.   Eyes: Negative for photophobia and visual disturbance.  Respiratory: Positive for cough. Negative for shortness of breath.   Cardiovascular: Negative for chest pain and leg swelling.  Gastrointestinal: Positive for nausea, vomiting and diarrhea. Negative for abdominal pain and constipation.  Endocrine: Negative for polyphagia and polyuria.  Genitourinary: Negative for dysuria, flank pain, vaginal bleeding, vaginal discharge and enuresis.  Musculoskeletal: Negative for back pain and gait problem.  Skin: Negative for color change and rash.   Neurological: Negative for dizziness, loss of consciousness, syncope, light-headedness and numbness.  Hematological: Negative for adenopathy. Does not bruise/bleed easily.  All other systems reviewed and are negative.     Allergies  Dilaudid; Pregabalin; Metoclopramide hcl; Neurontin; Other; and Reglan  Home Medications   Prior to Admission medications   Medication Sig Start Date End Date Taking? Authorizing Provider  albuterol (PROVENTIL HFA;VENTOLIN HFA) 108 (90 BASE) MCG/ACT inhaler Inhale 2 puffs into the lungs as needed for wheezing or shortness of breath. 02/03/14  Yes [name], MD  allopurinol (ZYLOPRIM) 300 MG tablet Take 150 mg by mouth daily.   Yes Historical Provider, MD  ALPRAZolam Duanne Moron) 1 MG tablet Take 1 mg by mouth daily as needed for anxiety. 10/08/13  Yes Darlyne Russian, MD  amoxicillin-clavulanate (AUGMENTIN) 875-125 MG per tablet Take 1 tablet by mouth 2 (two) times daily. For 10 days, starting 01/31/2014. 01/31/14  Yes [name], MD  baclofen (LIORESAL) 10 MG tablet Take 10 mg by mouth 3 (three) times daily as needed for muscle spasms.  04/13/13  Yes [name], MD  benazepril (LOTENSIN) 40 MG tablet Take 40 mg by mouth daily.   Yes Historical Provider, MD  Biotin 5000 MCG CAPS Take by mouth.   Yes Historical Provider, MD  cetirizine (ZYRTEC) 10 MG tablet Take 1 tablet (10 mg total) by mouth daily. 10/08/13  Yes Darlyne Russian, MD  Cholecalciferol (VITAMIN D) 2000 UNITS tablet Take 2,000 Units by mouth daily.   Yes Historical Provider, MD  furosemide (LASIX) 20 MG tablet Take 20 mg by mouth daily as needed for fluid or edema. 11/07/13  Yes [name], MD  HYDROcodone-acetaminophen [facility]) 10-325 MG per tablet Take 1-2 tablets by mouth every 6 (six) hours as needed (pain.).   Yes Historical Provider, MD  ibuprofen (ADVIL,MOTRIN) 600 MG tablet Take 600 mg by mouth as needed (pain.).    Yes Historical Provider, MD  lipase/protease/amylase  (CREON-10/PANCREASE) 12000 UNITS CPEP Take 3 capsules by mouth 4 (four) times daily -  with meals and at bedtime. As needed acute pancreatitis.   Yes Historical Provider, MD  metoprolol succinate (TOPROL-XL) 100 MG 24 hr tablet Take 1 tablet (100 mg total) by mouth daily. 11/07/13  Yes [name], MD  modafinil (PROVIGIL) 200 MG tablet Take 100 mg by mouth 2 (two) times daily. Take 100 12/23/13  Yes Carmen Dohmeier, MD  morphine (MS CONTIN) 30 MG 12 hr tablet Take by mouth 3 (three) times daily. Not from [facility] 09/23/12  Yes Historical Provider, MD  Multiple Vitamin (MULTIVITAMIN) tablet Take 1 tablet by mouth daily.   Yes Historical Provider, MD  Olopatadine HCl 0.2 % SOLN Apply 1 drop to eye 2 (two) times daily as needed (allergy.). 05/28/13  Yes [name], MD  omeprazole (PRILOSEC) 40 MG capsule Take 40 mg by mouth 2 (two) times daily.   Yes Historical Provider, MD  promethazine (PHENERGAN) 25  MG tablet Take 25 mg by mouth every 6 (six) hours as needed for nausea or vomiting.   Yes Historical Provider, MD  spironolactone (ALDACTONE) 100 MG tablet Take 1 tablet (100 mg total) by mouth daily. 11/07/13  Yes [name], MD   BP 142/93  Pulse 90  Temp(Src) 98 F (36.7 C) (Oral)  Resp 18  SpO2 96% Physical Exam  Vitals reviewed. Constitutional: She is oriented to person, place, and time. She appears well-developed and well-nourished.  HENT:  Head: Normocephalic and atraumatic.  Right Ear: External ear normal.  Left Ear: External ear normal.  Eyes: Conjunctivae and EOM are normal. Pupils are equal, round, and reactive to light.  Neck: Normal range of motion. Neck supple.  Cardiovascular: Regular rhythm, normal heart sounds and intact distal pulses.  Tachycardia present.   Pulmonary/Chest: Effort normal and breath sounds normal.  Abdominal: Soft. Bowel sounds are normal. There is no tenderness.  Musculoskeletal: Normal range of motion.  Neurological: She is alert and oriented to  person, place, and time.  Skin: Skin is warm and dry.    ED Course  Procedures (including critical care time) Labs Review Labs Reviewed  CBC WITH DIFFERENTIAL - Abnormal; Notable for the following:    WBC 13.3 (*)    Neutrophils Relative % 84 (*)    Neutro Abs 11.2 (*)    All other components within normal limits  BASIC METABOLIC PANEL - Abnormal; Notable for the following:    Glucose, Bld 143 (*)    Creatinine, Ser 1.33 (*)    GFR calc non Af Amer 45 (*)    GFR calc Af Amer 53 (*)    Anion gap 17 (*)    All other components within normal limits  D-DIMER, QUANTITATIVE - Abnormal; Notable for the following:    D-Dimer, Quant 0.78 (*)    All other components within normal limits  URINALYSIS, ROUTINE W REFLEX MICROSCOPIC    Imaging Review Dg Chest 2 View  02/03/2014   CLINICAL DATA:  Aspiration pneumonia, fever, chills  EXAM: CHEST  2 VIEW  COMPARISON:  11/14/2012  FINDINGS: Cardiomediastinal silhouette is unremarkable. No acute infiltrate or pleural effusion. No pulmonary edema. Mild degenerative changes thoracic spine.  IMPRESSION: No active cardiopulmonary disease.   Electronically Signed   By: Lahoma Crocker M.D.   On: 02/03/2014 20:10   Ct Angio Chest Pe W/cm &/or Wo Cm  02/03/2014   CLINICAL DATA:  Shortness of breath.  Cough.  Elevated D-dimer.  EXAM: CT ANGIOGRAPHY CHEST WITH CONTRAST  TECHNIQUE: Multidetector CT imaging of the chest was performed using the standard protocol during bolus administration of intravenous contrast. Multiplanar CT image reconstructions and MIPs were obtained to evaluate the vascular anatomy. Poor IV access.  CONTRAST:  130mL OMNIPAQUE IOHEXOL 350 MG/ML SOLN  COMPARISON:  None.  FINDINGS: Technically adequate study with moderately good opacification of the central and proximal segmental pulmonary arteries. No central filling defects demonstrated, suggesting no evidence of significant pulmonary embolus. Normal heart size. Normal caliber thoracic aorta. No  aortic dissection. Esophagus is decompressed. No significant lymphadenopathy in the chest. No pleural effusions.  Mild dependent atelectasis in the posterior lung bases. No focal airspace disease or consolidation in the lungs. No pneumothorax. Airways appear patent.  Visualized portions of the upper abdominal organs are grossly unremarkable. No destructive bone lesions appreciated.  Review of the MIP images confirms the above findings.  IMPRESSION: No evidence of significant pulmonary embolus.   Electronically Signed   By: Gwyndolyn Saxon  Gerilyn Nestle M.D.   On: 02/03/2014 23:59     EKG Interpretation   Date/Time:  Tuesday February 03 2014 19:55:20 EDT Ventricular Rate:  91 PR Interval:  160 QRS Duration: 83 QT Interval:  367 QTC Calculation: 451 R Axis:   24 Text Interpretation:  Normal sinus rhythm No old tracing to compare  Confirmed by Debby Freiberg [phone]) on 02/04/2014 12:06:14 AM      MDM   Final diagnoses:  Dyspnea    53 y.o. female  with pertinent PMH of recent PNA sp augmentin course, GERD, anxiety presents with worsening of chills, nausea, vomiting, malaise.  Patient has completed antibiotics course for pneumonia likely aspiration, feels more short of breath and feels that she has cough. On arrival today vitals signs and physical exam as above. Patient tachycardic, sinus rhythm.  Lung sounds clear bilaterally.  She was initially mildly diaphoretic, however denies any chest pain except with coughing. Given recent increase in respiratory effort and diaphoresis despite clear chest x-ray, obtained d-dimer which was positive, CT scan obtained and did not demonstrate any pulmonary embolism or pneumonia.  Likely etiology of patient's symptoms is continued malaise from resolving infectious process. She is able to tolerate by mouth intake in the department, and ambulate unassisted. Her vital signs normalized after 1 L of normal saline, and she has not had hypoxia. At this time I feel the patient is  stable to discharge home with PCP followup.  Doubt ACS given constellation of signs and symptoms.    Labs and imaging as above reviewed.   1. Dyspnea         Debby Freiberg, MD 02/04/14 [phone]

## 2014-02-03 NOTE — ED Notes (Addendum)
Pt was diagnosed with PNA on 8/22 and given antibiotics and was to follow up today. Pt states that last night she started having sweats with chills and unable to sleep. Pt now having diarrhea from antibiotics, sinus problems.  Pt was sent here from [facility]gent Care for further treatment of symptoms. Pt also states that she has had some confusion that started while she was at Urgent Care.

## 2014-02-04 ENCOUNTER — Telehealth: Payer: Self-pay

## 2014-02-04 MED ORDER — PROCHLORPERAZINE MALEATE 10 MG PO TABS
10.0000 mg | ORAL_TABLET | Freq: Once | ORAL | Status: AC
  Administered 2014-02-04: 10 mg via ORAL
  Filled 2014-02-04: qty 1

## 2014-02-04 MED ORDER — DIPHENHYDRAMINE HCL 25 MG PO CAPS
25.0000 mg | ORAL_CAPSULE | Freq: Once | ORAL | Status: AC
  Administered 2014-02-04: 25 mg via ORAL
  Filled 2014-02-04: qty 1

## 2014-02-04 NOTE — Telephone Encounter (Signed)
Pt will be in the office to follow up with Dr. Joseph Art this weekend. Sooner if she starts to decline.

## 2014-02-04 NOTE — Discharge Instructions (Signed)

## 2014-02-04 NOTE — Telephone Encounter (Signed)
Pt states she just got out of the hospital and wanted Dr Synetta Shadow to know her white count is elevated and she is feeling better, wanted to know when should she f/u with Dr. Synetta Shadow Please call [phone]

## 2014-02-07 ENCOUNTER — Ambulatory Visit (INDEPENDENT_AMBULATORY_CARE_PROVIDER_SITE_OTHER): Payer: Medicare Other | Admitting: Family Medicine

## 2014-02-07 VITALS — BP 124/76 | HR 96 | Temp 98.3°F | Resp 18 | Ht 64.0 in | Wt 280.0 lb

## 2014-02-07 DIAGNOSIS — J45909 Unspecified asthma, uncomplicated: Secondary | ICD-10-CM | POA: Diagnosis not present

## 2014-02-07 DIAGNOSIS — R51 Headache: Secondary | ICD-10-CM

## 2014-02-07 DIAGNOSIS — J208 Acute bronchitis due to other specified organisms: Secondary | ICD-10-CM

## 2014-02-07 DIAGNOSIS — J209 Acute bronchitis, unspecified: Secondary | ICD-10-CM

## 2014-02-07 LAB — POCT CBC
Granulocyte percent: 79.1 %G (ref 37–80)
HCT, POC: 39.2 % (ref 37.7–47.9)
Hemoglobin: 12.7 g/dL (ref 12.2–16.2)
Lymph, poc: 1.9 (ref 0.6–3.4)
MCH, POC: 29.8 pg (ref 27–31.2)
MCHC: 32.4 g/dL (ref 31.8–35.4)
MCV: 92.1 fL (ref 80–97)
MID (cbc): 0.5 (ref 0–0.9)
MPV: 6.9 fL (ref 0–99.8)
POC Granulocyte: 9.1 — AB (ref 2–6.9)
POC LYMPH PERCENT: 16.8 %L (ref 10–50)
POC MID %: 4.1 %M (ref 0–12)
Platelet Count, POC: 444 10*3/uL — AB (ref 142–424)
RBC: 4.25 M/uL (ref 4.04–5.48)
RDW, POC: 15.1 %
WBC: 11.5 10*3/uL — AB (ref 4.6–10.2)

## 2014-02-07 MED ORDER — HYDROCODONE-HOMATROPINE 5-1.5 MG/5ML PO SYRP: 5.0000 mL | ORAL_SOLUTION | Freq: Three times a day (TID) | ORAL | Status: DC | PRN

## 2014-02-07 MED ORDER — BUTALBITAL-APAP-CAFFEINE 50-325-40 MG PO TABS: 1.0000 | ORAL_TABLET | Freq: Four times a day (QID) | ORAL | Status: DC | PRN

## 2014-02-07 NOTE — Addendum Note (Signed)
Addended by: [name] on: 02/07/2014 03:25 PM   Modules accepted: Orders

## 2014-02-07 NOTE — Progress Notes (Signed)
Subjective:    Patient ID: Valerie [name], female    DOB: Oct 28, 1960, 53 y.o.   MRN: [id number]  Fever  Associated symptoms include headaches and wheezing.   Chief Complaint  Patient presents with   Fever   Hospitalization Follow-up   Chills   This chart was scribed for [name] , MD by Thea Alken, ED Scribe. This patient was seen in room 10 and the patient's care was started at 2:47 PM.  HPI Comments: Valerie [name] is a 53 y.o. female who presents to the [facility] complaining of 1 week history of cough after early am aspiration while sitting on recliner. Pt states she is still wheezing and has been using her inhaler. She reports a fever and diaphoresis 1 day ago with constant clammy hands. Pt reports unchanged HA's, initially starting behind bilateral eyes with immense pressure. Pt took ibuprofen for symptoms with mild relief. Pt is still taking Augmentin and should be finished the dose in 2 days.   Past Medical History  Diagnosis Date   Hypertension    Anxiety    Mental disorder    Depression    Sleep apnea    Headache(784.0)    Neuromuscular disorder    Nerve pain     nerve damage to right arm   Fibromyalgia    Arthritis     degenerative   Anemia 05/01/2012   Gastro-esophageal reflux    Sleep apnea     CPAP-9   Pancreatitis    Diarrhea     EPISODIC   Back pain     lower   Past Surgical History  Procedure Laterality Date   Cholecystectomy     Rotator cuff repair  bilateral   Nasal septoplasty w/ turbinoplasty     Esophagogastroduodenoscopy  12/21/2011    Procedure: ESOPHAGOGASTRODUODENOSCOPY (EGD);  Surgeon: Missy Sabins, MD;  Location: [facility] ENDOSCOPY;  Service: Endoscopy;  Laterality: N/A;   Gallbladder surgery     Flexible sigmoidoscopy  05/01/2012    Procedure: FLEXIBLE SIGMOIDOSCOPY;  Surgeon: Jeryl Columbia, MD;  Location: [facility] ENDOSCOPY;  Service: Endoscopy;  Laterality: N/A;  unprepped   Biceps  tendon repair      13   Nasal sinus surgery     Esophagogastroduodenoscopy N/A 11/06/2012    Procedure: ESOPHAGOGASTRODUODENOSCOPY (EGD);  Surgeon: Missy Sabins, MD;  Location: [facility] ENDOSCOPY;  Service: Endoscopy;  Laterality: N/A;   Botox injection N/A 11/06/2012    Procedure: BOTOX INJECTION;  Surgeon: Missy Sabins, MD;  Location: [facility] ENDOSCOPY;  Service: Endoscopy;  Laterality: N/A;   Esophagogastroduodenoscopy N/A 12/04/2013    Procedure: ESOPHAGOGASTRODUODENOSCOPY (EGD);  Surgeon: Missy Sabins, MD;  Location: [facility] ENDOSCOPY;  Service: Endoscopy;  Laterality: N/A;   Botox injection N/A 12/04/2013    Procedure: BOTOX INJECTION;  Surgeon: Missy Sabins, MD;  Location: [place];  Service: Endoscopy;  Laterality: N/A;   Prior to Admission medications   Medication Sig Start Date End Date Taking? Authorizing Provider  albuterol (PROVENTIL HFA;VENTOLIN HFA) 108 (90 BASE) MCG/ACT inhaler Inhale 2 puffs into the lungs as needed for wheezing or shortness of breath. 02/03/14  Yes [name], MD  allopurinol (ZYLOPRIM) 300 MG tablet Take 150 mg by mouth daily.   Yes Historical Provider, MD  ALPRAZolam Duanne Moron) 1 MG tablet Take 1 mg by mouth daily as needed for anxiety. 10/08/13  Yes Darlyne Russian, MD  amoxicillin-clavulanate (AUGMENTIN) 875-125 MG per tablet Take 1 tablet by mouth 2 (  two) times daily. For 10 days, starting 01/31/2014. 01/31/14  Yes [name], MD  baclofen (LIORESAL) 10 MG tablet Take 10 mg by mouth 3 (three) times daily as needed for muscle spasms.  04/13/13  Yes [name], MD  benazepril (LOTENSIN) 40 MG tablet Take 40 mg by mouth daily.   Yes Historical Provider, MD  Biotin 5000 MCG CAPS Take by mouth.   Yes Historical Provider, MD  cetirizine (ZYRTEC) 10 MG tablet Take 1 tablet (10 mg total) by mouth daily. 10/08/13  Yes Darlyne Russian, MD  Cholecalciferol (VITAMIN D) 2000 UNITS tablet Take 2,000 Units by mouth daily.   Yes Historical Provider, MD  furosemide (LASIX) 20 MG  tablet Take 20 mg by mouth daily as needed for fluid or edema. 11/07/13  Yes [name], MD  HYDROcodone-acetaminophen [facility]) 10-325 MG per tablet Take 1-2 tablets by mouth every 6 (six) hours as needed (pain.).   Yes Historical Provider, MD  ibuprofen (ADVIL,MOTRIN) 600 MG tablet Take 600 mg by mouth as needed (pain.).    Yes Historical Provider, MD  lipase/protease/amylase (CREON-10/PANCREASE) 12000 UNITS CPEP Take 3 capsules by mouth 4 (four) times daily -  with meals and at bedtime. As needed acute pancreatitis.   Yes Historical Provider, MD  metoprolol succinate (TOPROL-XL) 100 MG 24 hr tablet Take 1 tablet (100 mg total) by mouth daily. 11/07/13  Yes [name], MD  modafinil (PROVIGIL) 200 MG tablet Take 100 mg by mouth 2 (two) times daily. Take 100 12/23/13  Yes Carmen Dohmeier, MD  morphine (MS CONTIN) 30 MG 12 hr tablet Take by mouth 3 (three) times daily. Not from [facility] 09/23/12  Yes Historical Provider, MD  Multiple Vitamin (MULTIVITAMIN) tablet Take 1 tablet by mouth daily.   Yes Historical Provider, MD  Olopatadine HCl 0.2 % SOLN Apply 1 drop to eye 2 (two) times daily as needed (allergy.). 05/28/13  Yes [name], MD  omeprazole (PRILOSEC) 40 MG capsule Take 40 mg by mouth 2 (two) times daily.   Yes Historical Provider, MD  promethazine (PHENERGAN) 25 MG tablet Take 25 mg by mouth every 6 (six) hours as needed for nausea or vomiting.   Yes Historical Provider, MD  spironolactone (ALDACTONE) 100 MG tablet Take 1 tablet (100 mg total) by mouth daily. 11/07/13  Yes [name], MD   Review of Systems  Constitutional: Positive for fever and diaphoresis.  Respiratory: Positive for wheezing.   Neurological: Positive for headaches.   Objective:   Physical Exam  Nursing note and vitals reviewed. Constitutional: She is oriented to person, place, and time. She appears well-developed and well-nourished. No distress.  HENT:  Head: Normocephalic and atraumatic.  Eyes:  Conjunctivae and EOM are normal.  Neck: Neck supple.  Cardiovascular: Normal rate.   Pulmonary/Chest: Effort normal.  Musculoskeletal: Normal range of motion.  Neurological: She is alert and oriented to person, place, and time.  Skin: Skin is warm and dry.  Psychiatric: She has a normal mood and affect. Her behavior is normal.   Results for orders placed in visit on 02/07/14  POCT CBC      Result Value Ref Range   WBC 11.5 (*) 4.6 - 10.2 K/uL   Lymph, poc 1.9  0.6 - 3.4   POC LYMPH PERCENT 16.8  10 - 50 %L   MID (cbc) 0.5  0 - 0.9   POC MID % 4.1  0 - 12 %M   POC Granulocyte 9.1 (*) 2 - 6.9   Granulocyte  percent 79.1  37 - 80 %G   RBC 4.25  4.04 - 5.48 M/uL   Hemoglobin 12.7  12.2 - 16.2 g/dL   HCT, POC 39.2  37.7 - 47.9 %   MCV 92.1  80 - 97 fL   MCH, POC 29.8  27 - 31.2 pg   MCHC 32.4  31.8 - 35.4 g/dL   RDW, POC 15.1     Platelet Count, POC 444 (*) 142 - 424 K/uL   MPV 6.9  0 - 99.8 fL     Assessment & Plan:    I think overall patient is improving. Her voice is stronger and she is much less emotionally she was when I last saw her.  I think she just needs to finish her antibiotics and over the next couple days we should see improvement.  Signed, Carola Frost.D.

## 2014-02-08 LAB — COMPREHENSIVE METABOLIC PANEL
ALT: 12 U/L (ref 0–35)
AST: 14 U/L (ref 0–37)
Albumin: 4.4 g/dL (ref 3.5–5.2)
Alkaline Phosphatase: 45 U/L (ref 39–117)
BUN: 13 mg/dL (ref 6–23)
CO2: 25 mEq/L (ref 19–32)
Calcium: 10.3 mg/dL (ref 8.4–10.5)
Chloride: 108 mEq/L (ref 96–112)
Creat: 1.43 mg/dL — ABNORMAL HIGH (ref 0.50–1.10)
Glucose, Bld: 104 mg/dL — ABNORMAL HIGH (ref 70–99)
Potassium: 5.3 mEq/L (ref 3.5–5.3)
Sodium: 142 mEq/L (ref 135–145)
Total Bilirubin: 0.2 mg/dL (ref 0.2–1.2)
Total Protein: 7.5 g/dL (ref 6.0–8.3)

## 2014-02-11 ENCOUNTER — Telehealth: Payer: Self-pay

## 2014-02-11 ENCOUNTER — Telehealth: Payer: Self-pay | Admitting: Family Medicine

## 2014-02-11 NOTE — Telephone Encounter (Signed)
Duplicate message- addressed in previous telephone message.

## 2014-02-11 NOTE — Telephone Encounter (Signed)
I am overbooked on Thursday.  Friday is better unless she is getting worse, in which case, one of the other providers should see her

## 2014-02-11 NOTE — Telephone Encounter (Signed)
Pt states she will be in on Friday. She is feeling better. Pt states she was told to follow up on Wednesday between 9-5 with Dr. Joseph Art. Dr. Joseph Art is not here until Friday. He has appts on Thursday. Pt was told he is "booked up for Thursday and would need to approve the appt before it was made." Please advise if pt may be fitted into the schedule on Thursday.

## 2014-02-11 NOTE — Telephone Encounter (Signed)
Dr. Joseph Art,  Pt called stating she came in on Wednesday per your suggest to see you.  She has ran out of her antibiotics and will come back to the walk-in on Friday to see you, since your appt schedule was booked up on Thursday.    Please advise if this patient is approved to make appointments in the future.  Thank you,

## 2014-02-11 NOTE — Telephone Encounter (Signed)
Patient came in today at 80 because she thought Dr. Joseph Art was working today. She says she wanted to follow up with him and ask about what the next steps were for her pneumonia recovery. She says has also ran out of antibiotic I believe. Please verify. She thought he said he worked on weds at urgent care side.  Best: [phone]

## 2014-02-12 NOTE — Telephone Encounter (Signed)
Pt states she will be in tomorrow.

## 2014-02-13 ENCOUNTER — Ambulatory Visit (INDEPENDENT_AMBULATORY_CARE_PROVIDER_SITE_OTHER): Payer: Medicare Other | Admitting: Family Medicine

## 2014-02-13 VITALS — BP 118/62 | HR 80 | Temp 98.1°F | Resp 16 | Ht 64.5 in | Wt 281.0 lb

## 2014-02-13 DIAGNOSIS — J683 Other acute and subacute respiratory conditions due to chemicals, gases, fumes and vapors: Secondary | ICD-10-CM

## 2014-02-13 DIAGNOSIS — K2100 Gastro-esophageal reflux disease with esophagitis, without bleeding: Secondary | ICD-10-CM | POA: Diagnosis not present

## 2014-02-13 DIAGNOSIS — J45909 Unspecified asthma, uncomplicated: Secondary | ICD-10-CM

## 2014-02-13 DIAGNOSIS — R51 Headache: Secondary | ICD-10-CM

## 2014-02-13 DIAGNOSIS — J690 Pneumonitis due to inhalation of food and vomit: Secondary | ICD-10-CM

## 2014-02-13 MED ORDER — ALPRAZOLAM 1 MG PO TABS: 1.0000 mg | ORAL_TABLET | Freq: Every day | ORAL | Status: DC | PRN

## 2014-02-13 NOTE — Progress Notes (Signed)
Patient ID: Valerie [name] MRN: [id number], DOB: Oct 17, 1960, 53 y.o. Date of Encounter: 02/13/2014, 10:33 AM  This chart was scribed for Valerie Haber MD by Cathie Hoops, ED Scribe. The patient was seen in Room 14. The patient's care was started at 10:33 AM.   Primary Physician: Valerie Haber, MD  Chief Complaint: Follow-up  HPI: 53 y.o. year old female with history below presents for follow up visit. Pt notes her voice is slightly better than it was at her last visit. Pt notes some diarrhea due to antibiotic usage. She states she finished the antibiotics 4 days ago. She notes she gets Botox injected into her pyloric sphincter with Dr. Teena Irani usually with immediate relief from acid reflex, gastric emptying, and indigestion. Her last surgery was 12/04/13 but notes she did not experience the same relief after this current surgery. She notes she was given an inhaler due to respiratory symptoms, but states she rarely uses it due to lack of symptoms. She states she also has moderate headache deep behind her right eye and following the midline of her head onset 3 days ago. She saw a physical therapist states it was due to her shoulder and neck muscle groups. It was previously been relieved with Xanax and ibuprofen. Pt denies any new issues.   Pt notes she is experiencing some stress at work.   Last Pap Smear: 2 years ago Mammogram: 2 years ago  Past Medical History  Diagnosis Date  . Hypertension   . Anxiety   . Mental disorder   . Depression   . Sleep apnea   . Headache(784.0)   . Neuromuscular disorder   . 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 Fibromyalgia   . Arthritis     degenerative  . Anemia 05/01/2012  . Gastro-esophageal reflux   . Sleep apnea     CPAP-9  . Pancreatitis   . Diarrhea     EPISODIC  . Back pain     lower     Home Meds: Prior to Admission medications   Medication Sig Start Date End Date Taking? Authorizing Provider  albuterol (PROVENTIL  HFA;VENTOLIN HFA) 108 (90 BASE) MCG/ACT inhaler Inhale 2 puffs into the lungs as needed for wheezing or shortness of breath. 02/03/14  Yes Valerie Haber, MD  allopurinol (ZYLOPRIM) 300 MG tablet Take 150 mg by mouth daily.   Yes Historical Provider, MD  ALPRAZolam Duanne Moron) 1 MG tablet Take 1 mg by mouth daily as needed for anxiety. 10/08/13  Yes Darlyne Russian, MD  amoxicillin-clavulanate (AUGMENTIN) 875-125 MG per tablet Take 1 tablet by mouth 2 (two) times daily. For 10 days, starting 01/31/2014. 01/31/14  Yes Valerie Haber, MD  baclofen (LIORESAL) 10 MG tablet Take 10 mg by mouth 3 (three) times daily as needed for muscle spasms.  04/13/13  Yes Valerie Haber, MD  benazepril (LOTENSIN) 40 MG tablet Take 40 mg by mouth daily.   Yes Historical Provider, MD  Biotin 5000 MCG CAPS Take by mouth.   Yes Historical Provider, MD  butalbital-acetaminophen-caffeine (FIORICET) 50-325-40 MG per tablet Take 1-2 tablets by mouth every 6 (six) hours as needed for headache. 02/07/14 02/07/15 Yes Valerie Haber, MD  cetirizine (ZYRTEC) 10 MG tablet Take 1 tablet (10 mg total) by mouth daily. 10/08/13  Yes Darlyne Russian, MD  Cholecalciferol (VITAMIN D) 2000 UNITS tablet Take 2,000 Units by mouth daily.   Yes Historical Provider, MD  furosemide (LASIX) 20 MG tablet Take 20  mg by mouth daily as needed for fluid or edema. 11/07/13  Yes Valerie Haber, MD  HYDROcodone-acetaminophen [facility]) 10-325 MG per tablet Take 1-2 tablets by mouth every 6 (six) hours as needed (pain.).   Yes Historical Provider, MD  HYDROcodone-homatropine (HYCODAN) 5-1.5 MG/5ML syrup Take 5 mLs by mouth every 8 (eight) hours as needed for cough. 02/07/14  Yes Valerie Haber, MD  ibuprofen (ADVIL,MOTRIN) 600 MG tablet Take 600 mg by mouth as needed (pain.).    Yes Historical Provider, MD  lipase/protease/amylase (CREON-10/PANCREASE) 12000 UNITS CPEP Take 3 capsules by mouth 4 (four) times daily -  with meals and at bedtime. As needed acute pancreatitis.    Yes Historical Provider, MD  metoprolol succinate (TOPROL-XL) 100 MG 24 hr tablet Take 1 tablet (100 mg total) by mouth daily. 11/07/13  Yes Valerie Haber, MD  modafinil (PROVIGIL) 200 MG tablet Take 100 mg by mouth 2 (two) times daily. Take 100 12/23/13  Yes Carmen Dohmeier, MD  morphine (MS CONTIN) 30 MG 12 hr tablet Take by mouth 3 (three) times daily. Not from [facility] 09/23/12  Yes Historical Provider, MD  Multiple Vitamin (MULTIVITAMIN) tablet Take 1 tablet by mouth daily.   Yes Historical Provider, MD  Olopatadine HCl 0.2 % SOLN Apply 1 drop to eye 2 (two) times daily as needed (allergy.). 05/28/13  Yes Valerie Haber, MD  omeprazole (PRILOSEC) 40 MG capsule Take 40 mg by mouth 2 (two) times daily.   Yes Historical Provider, MD  promethazine (PHENERGAN) 25 MG tablet Take 25 mg by mouth every 6 (six) hours as needed for nausea or vomiting.   Yes Historical Provider, MD  spironolactone (ALDACTONE) 100 MG tablet Take 1 tablet (100 mg total) by mouth daily. 11/07/13  Yes Valerie Haber, MD    Allergies:  Allergies  Allergen Reactions  . Dilaudid [Hydromorphone Hcl] Other (See Comments)    Headache   . Pregabalin   . Metoclopramide Hcl Other (See Comments)    "crawling inside"  . Neurontin [Gabapentin] Other (See Comments)    Tremors   . Other Other (See Comments)    Avoid all SSRI's and SNRI's- damaged pancreas  . Reglan [Metoclopramide]     History   Social History  . Marital Status: Single    Spouse Name: N/A    Number of Children: 0  . Years of Education: College   Occupational History  . disabled    Social History Main Topics  . Smoking status: Never Smoker   . Smokeless tobacco: Never Used  . Alcohol Use: No  . Drug Use: No  . Sexual Activity: Yes   Other Topics Concern  . Not on file   Social History Narrative  . No narrative on file     Lab Results  Component Value Date   HGBA1C 6.4 06/22/2012    Review of Systems: Constitutional: negative for chills,  fever, night sweats, weight changes, or fatigue  HEENT: negative for vision changes, hearing loss, congestion, rhinorrhea, or epistaxis Cardiovascular: negative for chest pain, palpitations, diaphoresis, DOE, orthopnea, or edema Respiratory: negative for hemoptysis, wheezing, shortness of breath, dyspnea, or cough Abdominal: negative for abdominal pain, nausea, vomiting, or constipation. Positive for diarrhea. Dermatological: negative for rash, erythema, or wounds Neurologic: negative for dizziness, or syncope. Positive for headache. Renal:  Negative for polyuria, polydipsia, or dysuria All other systems reviewed and are otherwise negative with the exception to those above and in the HPI.   Physical Exam: Triage Vitals: Blood pressure 118/62, pulse 80,  temperature 98.1 F (36.7 C), temperature source Oral, resp. rate 16, height 5' 4.5" (1.638 m), weight 281 lb (127.461 kg), SpO2 99.00%., Body mass index is 47.51 kg/(m^2). Wt Readings from Last 3 Encounters:  02/13/14 281 lb (127.461 kg)  02/07/14 280 lb (127.007 kg)  02/03/14 280 lb 3.2 oz (127.098 kg)   BP Readings from Last 3 Encounters:  02/13/14 118/62  02/07/14 124/76  02/04/14 142/93   General: Well developed, well nourished, in no acute distress. Head: Normocephalic, atraumatic, eyes without discharge, sclera non-icteric, nares are without discharge. Bilateral auditory canals clear, TM's are without perforation, pearly grey and translucent with reflective cone of light bilaterally. Oral cavity moist, posterior pharynx without exudate, erythema, peritonsillar abscess, or post nasal drip.  Neck: Supple. No thyromegaly. Full ROM. No lymphadenopathy. Lungs: Clear bilaterally to auscultation without wheezes, rales, or rhonchi. Breathing is unlabored. Heart: RRR with S1 S2. No murmurs, rubs, or gallops appreciated. Abdomen: Soft, non-tender, non-distended with normoactive bowel sounds. No hepatosplenomegaly. No rebound/guarding. No  obvious abdominal masses. Msk:  Strength and tone normal for age. Extremities/Skin: Warm and dry. No clubbing or cyanosis. No edema. No rashes, wounds, or suspicious lesions.  Neuro: Alert and oriented X 3. Moves all extremities spontaneously. Gait is normal. CNII-XII grossly in tact. Psych:  Responds to questions appropriately with a normal affect.     ASSESSMENT AND PLAN:  10:52 AM- Patient informed of current plan for treatment and evaluation and agrees with plan at this time. Aspiration pneumonia, unspecified aspiration pneumonia type  Headache(784.0) - Plan: ALPRAZolam (XANAX) 1 MG tablet, Ambulatory referral to Physical Therapy  Gastroesophageal reflux disease with esophagitis  Reactive airways dysfunction syndrome, mild intermittent, uncomplicated  Overall patient is feeling better infectious lost some weight which is also helpful for her overall condition. She's due to see Dr. Amedeo Plenty next week to consider a repeat Botox injection of her pylorus because she probably still has some gastroparesis.  -  Signed, Valerie Haber, MD 02/13/2014 10:26 AM

## 2014-02-17 ENCOUNTER — Other Ambulatory Visit: Payer: Self-pay | Admitting: Family Medicine

## 2014-02-17 DIAGNOSIS — M542 Cervicalgia: Secondary | ICD-10-CM

## 2014-02-18 ENCOUNTER — Other Ambulatory Visit (HOSPITAL_COMMUNITY): Payer: Self-pay | Admitting: Gastroenterology

## 2014-02-18 DIAGNOSIS — K3184 Gastroparesis: Secondary | ICD-10-CM | POA: Diagnosis not present

## 2014-02-18 DIAGNOSIS — K2940 Chronic atrophic gastritis without bleeding: Secondary | ICD-10-CM

## 2014-02-18 DIAGNOSIS — H2510 Age-related nuclear cataract, unspecified eye: Secondary | ICD-10-CM | POA: Diagnosis not present

## 2014-02-18 DIAGNOSIS — H35039 Hypertensive retinopathy, unspecified eye: Secondary | ICD-10-CM | POA: Diagnosis not present

## 2014-02-18 DIAGNOSIS — H43819 Vitreous degeneration, unspecified eye: Secondary | ICD-10-CM | POA: Diagnosis not present

## 2014-02-20 ENCOUNTER — Other Ambulatory Visit (HOSPITAL_COMMUNITY): Payer: Self-pay | Admitting: Gastroenterology

## 2014-02-20 DIAGNOSIS — K3184 Gastroparesis: Secondary | ICD-10-CM

## 2014-02-24 ENCOUNTER — Encounter (HOSPITAL_COMMUNITY)
Admission: RE | Admit: 2014-02-24 | Discharge: 2014-02-24 | Disposition: A | Discharge status: Home / Self Care | Payer: Medicare Other | Source: Ambulatory Visit | Attending: Gastroenterology | Admitting: Gastroenterology

## 2014-02-24 ENCOUNTER — Other Ambulatory Visit: Payer: Self-pay | Admitting: Gastroenterology

## 2014-02-24 DIAGNOSIS — K3184 Gastroparesis: Secondary | ICD-10-CM

## 2014-02-24 DIAGNOSIS — R112 Nausea with vomiting, unspecified: Secondary | ICD-10-CM | POA: Diagnosis not present

## 2014-02-24 DIAGNOSIS — R109 Unspecified abdominal pain: Secondary | ICD-10-CM | POA: Diagnosis not present

## 2014-02-24 MED ORDER — TECHNETIUM TC 99M SULFUR COLLOID
2.0000 | Freq: Once | INTRAVENOUS | Status: AC | PRN
  Administered 2014-02-24: 2 via ORAL

## 2014-02-25 ENCOUNTER — Encounter (HOSPITAL_COMMUNITY): Payer: Self-pay | Admitting: Pharmacy Technician

## 2014-02-25 NOTE — Addendum Note (Signed)
Addended by: Teena Irani on: 02/25/2014 09:00 AM   Modules accepted: Orders

## 2014-02-26 ENCOUNTER — Encounter (HOSPITAL_COMMUNITY)
Admission: RE | Disposition: A | Discharge status: Home / Self Care | Payer: Self-pay | Source: Ambulatory Visit | Attending: Gastroenterology

## 2014-02-26 ENCOUNTER — Ambulatory Visit (HOSPITAL_COMMUNITY)
Admission: RE | Admit: 2014-02-26 | Discharge: 2014-02-26 | Disposition: A | Discharge status: Home / Self Care | Payer: Medicare Other | Source: Ambulatory Visit | Attending: Gastroenterology | Admitting: Gastroenterology

## 2014-02-26 ENCOUNTER — Encounter (HOSPITAL_COMMUNITY): Payer: Self-pay | Admitting: *Deleted

## 2014-02-26 DIAGNOSIS — K3184 Gastroparesis: Secondary | ICD-10-CM | POA: Diagnosis not present

## 2014-02-26 HISTORY — PX: BOTOX INJECTION: SHX5754

## 2014-02-26 HISTORY — PX: ESOPHAGOGASTRODUODENOSCOPY: SHX5428

## 2014-02-26 SURGERY — EGD (ESOPHAGOGASTRODUODENOSCOPY)
Anesthesia: Moderate Sedation

## 2014-02-26 MED ORDER — BUTAMBEN-TETRACAINE-BENZOCAINE 2-2-14 % EX AERO
INHALATION_SPRAY | CUTANEOUS | Status: DC | PRN
  Administered 2014-02-26: 2 via TOPICAL

## 2014-02-26 MED ORDER — FENTANYL CITRATE 0.05 MG/ML IJ SOLN
INTRAMUSCULAR | Status: DC | PRN
Start: 2014-02-26 — End: 2014-02-26
  Administered 2014-02-26 (×5): 25 ug via INTRAVENOUS

## 2014-02-26 MED ORDER — SODIUM CHLORIDE 0.9 % IV SOLN: INTRAVENOUS | Status: DC

## 2014-02-26 MED ORDER — DIPHENHYDRAMINE HCL 50 MG/ML IJ SOLN
INTRAMUSCULAR | Status: AC
  Filled 2014-02-26: qty 1

## 2014-02-26 MED ORDER — MIDAZOLAM HCL 10 MG/2ML IJ SOLN
INTRAMUSCULAR | Status: DC | PRN
  Administered 2014-02-26 (×5): 2 mg via INTRAVENOUS

## 2014-02-26 MED ORDER — FENTANYL CITRATE 0.05 MG/ML IJ SOLN
INTRAMUSCULAR | Status: AC
  Filled 2014-02-26: qty 2

## 2014-02-26 MED ORDER — DIPHENHYDRAMINE HCL 50 MG/ML IJ SOLN
INTRAMUSCULAR | Status: DC | PRN
  Administered 2014-02-26: 25 mg via INTRAVENOUS

## 2014-02-26 MED ORDER — MIDAZOLAM HCL 5 MG/ML IJ SOLN
INTRAMUSCULAR | Status: AC
  Filled 2014-02-26: qty 2

## 2014-02-26 MED ORDER — ONABOTULINUMTOXINA 100 UNITS IJ SOLR
INTRAMUSCULAR | Status: DC | PRN
  Administered 2014-02-26: 100 [IU] via INTRAMUSCULAR

## 2014-02-26 MED ORDER — SODIUM CHLORIDE 0.9 % IJ SOLN
100.0000 [IU] | Freq: Once | INTRAMUSCULAR | Status: DC
  Filled 2014-02-26: qty 100

## 2014-02-26 NOTE — Op Note (Signed)
[facility] [place], [address]   ENDOSCOPY PROCEDURE REPORT  PATIENT: Valerie, [name]  MR#: [id number] BIRTHDATE: [birth year] , 52  yrs. old GENDER: Female ENDOSCOPIST: Teena Irani, MD REFERRED BY: PROCEDURE DATE:  02/26/2014 PROCEDURE: ASA CLASS: INDICATIONS:  gastroparesis with good therapeutic response to Botox injection in the past MEDICATIONS: 75 mcg fentanyl, 8 mg Versed, 25 mg and Benadryl TOPICAL ANESTHETIC:  DESCRIPTION OF PROCEDURE: After the risks benefits and alternatives of the procedure were thoroughly explained, informed consent was obtained.  The Pentax Gastroscope [id number] endoscope was introduced through the mouth and advanced to the second portion of the duodenum     . Without limitations.  the esophagus appeared normal. The stomach also appeared normal with no retained food. The pylorus appeared patent and there appear to be peristalsis in the distal stomach. Botulinum toxin 25 units was injected in each of 4 quadrants of the pylorus muscle. The duodenum appeared normal.The instrument was slowly withdrawn as the mucosa was fully examined.      The scope was then withdrawn from the patient and the procedure completed.  COMPLICATIONS: There were no complications. ENDOSCOPIC IMPRESSION: anatomically normal study status post Botox injection of the pyloric sphincter for recurrent documented gastroparesis RECOMMENDATIONS: followup in my office when necessary REPEAT EXAM:  eSigned:  Teena Irani, MD 02/26/2014 1:30 PM   CC:

## 2014-02-27 ENCOUNTER — Encounter (HOSPITAL_COMMUNITY): Payer: Self-pay | Admitting: Gastroenterology

## 2014-02-27 DIAGNOSIS — R1013 Epigastric pain: Secondary | ICD-10-CM | POA: Diagnosis not present

## 2014-02-27 DIAGNOSIS — K3189 Other diseases of stomach and duodenum: Secondary | ICD-10-CM | POA: Diagnosis not present

## 2014-03-02 ENCOUNTER — Ambulatory Visit (HOSPITAL_COMMUNITY): Payer: Medicare Other

## 2014-03-09 DIAGNOSIS — M62838 Other muscle spasm: Secondary | ICD-10-CM | POA: Diagnosis not present

## 2014-03-09 DIAGNOSIS — G43019 Migraine without aura, intractable, without status migrainosus: Secondary | ICD-10-CM | POA: Diagnosis not present

## 2014-03-09 DIAGNOSIS — S139XXA Sprain of joints and ligaments of unspecified parts of neck, initial encounter: Secondary | ICD-10-CM | POA: Diagnosis not present

## 2014-03-13 DIAGNOSIS — S134XXD Sprain of ligaments of cervical spine, subsequent encounter: Secondary | ICD-10-CM | POA: Diagnosis not present

## 2014-03-13 DIAGNOSIS — G43719 Chronic migraine without aura, intractable, without status migrainosus: Secondary | ICD-10-CM | POA: Diagnosis not present

## 2014-03-13 DIAGNOSIS — M62830 Muscle spasm of back: Secondary | ICD-10-CM | POA: Diagnosis not present

## 2014-03-18 DIAGNOSIS — M62830 Muscle spasm of back: Secondary | ICD-10-CM | POA: Diagnosis not present

## 2014-03-18 DIAGNOSIS — G43719 Chronic migraine without aura, intractable, without status migrainosus: Secondary | ICD-10-CM | POA: Diagnosis not present

## 2014-03-18 DIAGNOSIS — S134XXD Sprain of ligaments of cervical spine, subsequent encounter: Secondary | ICD-10-CM | POA: Diagnosis not present

## 2014-03-20 DIAGNOSIS — S134XXD Sprain of ligaments of cervical spine, subsequent encounter: Secondary | ICD-10-CM | POA: Diagnosis not present

## 2014-03-20 DIAGNOSIS — Z23 Encounter for immunization: Secondary | ICD-10-CM | POA: Diagnosis not present

## 2014-03-20 DIAGNOSIS — M62830 Muscle spasm of back: Secondary | ICD-10-CM | POA: Diagnosis not present

## 2014-03-20 DIAGNOSIS — G43719 Chronic migraine without aura, intractable, without status migrainosus: Secondary | ICD-10-CM | POA: Diagnosis not present

## 2014-03-24 DIAGNOSIS — M62830 Muscle spasm of back: Secondary | ICD-10-CM | POA: Diagnosis not present

## 2014-03-24 DIAGNOSIS — G43719 Chronic migraine without aura, intractable, without status migrainosus: Secondary | ICD-10-CM | POA: Diagnosis not present

## 2014-03-24 DIAGNOSIS — S134XXD Sprain of ligaments of cervical spine, subsequent encounter: Secondary | ICD-10-CM | POA: Diagnosis not present

## 2014-03-25 DIAGNOSIS — K219 Gastro-esophageal reflux disease without esophagitis: Secondary | ICD-10-CM | POA: Diagnosis not present

## 2014-03-25 DIAGNOSIS — K3184 Gastroparesis: Secondary | ICD-10-CM | POA: Diagnosis not present

## 2014-03-26 ENCOUNTER — Encounter: Payer: Self-pay | Admitting: *Deleted

## 2014-03-26 ENCOUNTER — Ambulatory Visit (INDEPENDENT_AMBULATORY_CARE_PROVIDER_SITE_OTHER): Payer: Medicare Other | Admitting: Family Medicine

## 2014-03-26 VITALS — BP 100/70 | HR 72 | Temp 98.4°F | Resp 18 | Ht 65.0 in | Wt 283.0 lb

## 2014-03-26 DIAGNOSIS — G43719 Chronic migraine without aura, intractable, without status migrainosus: Secondary | ICD-10-CM | POA: Diagnosis not present

## 2014-03-26 DIAGNOSIS — K3184 Gastroparesis: Secondary | ICD-10-CM

## 2014-03-26 DIAGNOSIS — R7989 Other specified abnormal findings of blood chemistry: Secondary | ICD-10-CM | POA: Diagnosis not present

## 2014-03-26 DIAGNOSIS — M797 Fibromyalgia: Secondary | ICD-10-CM | POA: Diagnosis not present

## 2014-03-26 DIAGNOSIS — M62830 Muscle spasm of back: Secondary | ICD-10-CM | POA: Diagnosis not present

## 2014-03-26 DIAGNOSIS — R059 Cough, unspecified: Secondary | ICD-10-CM

## 2014-03-26 DIAGNOSIS — R05 Cough: Secondary | ICD-10-CM

## 2014-03-26 DIAGNOSIS — S134XXD Sprain of ligaments of cervical spine, subsequent encounter: Secondary | ICD-10-CM | POA: Diagnosis not present

## 2014-03-26 NOTE — Progress Notes (Signed)
Patient ID: Valerie [name], female   DOB: 19-Oct-1960, 53 y.o.   MRN: [id number]  This chart was scribed for [name], MD by Ladene Artist, ED Scribe. The patient was seen in room 5. Patient's care was started at 12:16 PM.  Patient ID: Valerie [name] MRN: [id number], DOB: [birth year], 53 y.o. Date of Encounter: 03/26/2014, 12:16 PM  Primary Physician: [name], MD  Chief Complaint  Patient presents with   Follow-up    Aspiration   HPI: 53 y.o. year old female with history below presents with follow-up regarding aspiration. Pt states that she developed a cough 1 week ago. She states that cough is productive at times but not often. Cough is worse in the mornings. She reports associated congestion. Pt states that she take Zyrtec often for environmental allergies.   Neck  Pt has been seeing Physical Therapist  for neck pain with relief. She states that she has noticed that her ROM has improved greatly since starting PT.   Urologist Pt reports that she has been told that her GFR was around 50% with her creatine level being 1.43. She states that she contacted her urologist who told her that it is actually 98-100%. She denies any urinary symptoms at this time.   Gastric Pt states that she had EGD done on 02/26/14. After 2 hours pt still had 99% left in stomach. She states that she ate only soups for a few days following procedure. She has noticed that emptying has improved since procedure.   Vaccine Pt received her flu vaccine and pneumovax 03/20/14.    Medication Pt states that she only takes ibuprofen as needed for muscle pain. She also states that she takes Xanax daily. Pt takes Albuterol only when she experiences URI symptoms. She takes hydrocodone as needed for knee pain.   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 She also does a lot of dog sitting.   Past Medical History  Diagnosis Date   Hypertension    Anxiety    Mental disorder     Depression    Sleep apnea    Headache(784.0)    Neuromuscular disorder    Nerve pain     nerve damage to right arm   Fibromyalgia    Arthritis     degenerative   Anemia 05/01/2012   Gastro-esophageal reflux    Sleep apnea     CPAP-9   Pancreatitis    Diarrhea     EPISODIC   Back pain     lower     Home Meds: Prior to Admission medications   Medication Sig Start Date End Date Taking? Authorizing Provider  albuterol (PROVENTIL HFA;VENTOLIN HFA) 108 (90 BASE) MCG/ACT inhaler Inhale 2 puffs into the lungs as needed for wheezing or shortness of breath. 02/03/14  Yes [name], MD  allopurinol (ZYLOPRIM) 300 MG tablet Take 150 mg by mouth daily.   Yes Historical Provider, MD  ALPRAZolam Duanne Moron) 1 MG tablet Take 1 mg by mouth daily as needed for anxiety.   Yes Historical Provider, MD  baclofen (LIORESAL) 10 MG tablet Take 10 mg by mouth 3 (three) times daily as needed for muscle spasms.  04/13/13  Yes [name], MD  benazepril (LOTENSIN) 40 MG tablet Take 40 mg by mouth daily.   Yes Historical Provider, MD  Biotin 5000 MCG CAPS Take 5,000 mcg by mouth daily.    Yes Historical Provider, MD  butalbital-acetaminophen-caffeine (FIORICET, ESGIC) 50-325-40 MG per tablet Take 1-2  tablets by mouth 2 (two) times daily as needed for headache or migraine.   Yes Historical Provider, MD  cetirizine (ZYRTEC) 10 MG tablet Take 10 mg by mouth daily.   Yes Historical Provider, MD  Cholecalciferol (VITAMIN D) 2000 UNITS tablet Take 2,000 Units by mouth daily.   Yes Historical Provider, MD  furosemide (LASIX) 20 MG tablet Take 20 mg by mouth every other day.  11/07/13  Yes [name], MD  HYDROcodone-acetaminophen [facility]) 10-325 MG per tablet Take 1-2 tablets by mouth every 6 (six) hours as needed (pain.).   Yes Historical Provider, MD  ibuprofen (ADVIL,MOTRIN) 600 MG tablet Take 600 mg by mouth every 6 (six) hours as needed for mild pain.    Yes Historical Provider, MD    metoprolol succinate (TOPROL-XL) 100 MG 24 hr tablet Take 100 mg by mouth daily. Take with or immediately following a meal.   Yes Historical Provider, MD  modafinil (PROVIGIL) 200 MG tablet Take 100 mg by mouth 2 (two) times daily.  12/23/13  Yes Asencion Partridge Dohmeier, MD  morphine (MS CONTIN) 30 MG 12 hr tablet Take 30 mg by mouth every 12 (twelve) hours. Not from [facility] 09/23/12  Yes Historical Provider, MD  Multiple Vitamins-Minerals (MULTIVITAMIN PO) Take 1 tablet by mouth daily.   Yes Historical Provider, MD  Olopatadine HCl 0.2 % SOLN Apply 1 drop to eye 2 (two) times daily as needed (allergy.). 05/28/13  Yes [name], MD  omeprazole (PRILOSEC) 40 MG capsule Take 40 mg by mouth 2 (two) times daily.   Yes Historical Provider, MD  Pancrelipase, Lip-Prot-Amyl, (CREON) 24000 UNITS CPEP Take 48,000 Units by mouth 4 (four) times daily as needed (for pancreatitis).   Yes Historical Provider, MD  promethazine (PHENERGAN) 25 MG tablet Take 25 mg by mouth every 6 (six) hours as needed for nausea or vomiting.   Yes Historical Provider, MD  spironolactone (ALDACTONE) 100 MG tablet Take 100 mg by mouth daily.   Yes Historical Provider, MD    Allergies:  Allergies  Allergen Reactions   Dilaudid [Hydromorphone Hcl] Other (See Comments)    Headache    Pregabalin    Metoclopramide Hcl Other (See Comments)    "crawling inside"   Neurontin [Gabapentin] Other (See Comments)    Tremors    Other Other (See Comments)    Avoid all SSRI's and SNRI's- damaged pancreas   Reglan [Metoclopramide]     History   Social History   Marital Status: Single    Spouse Name: N/A    Number of Children: 0   Years of Education: Copywriter, advertising History   disabled    Social History Main Topics   Smoking status: Never Smoker    Smokeless tobacco: Never Used   Alcohol Use: No   Drug Use: No   Sexual Activity: Yes   Other Topics Concern   Not on file   Social History Narrative   No  narrative on file     Review of Systems: Constitutional: negative for chills, fever, night sweats, weight changes, or fatigue  HEENT: negative for vision changes, hearing loss, rhinorrhea, ST, epistaxis, or sinus pressure, +congestion Cardiovascular: negative for chest pain or palpitations Respiratory: negative for hemoptysis, wheezing, or shortness of breath, +cough Abdominal: negative for abdominal pain, nausea, vomiting, diarrhea, or constipation GU: negative for urinary symptoms Dermatological: negative for rash Immuno: +env allergies Neurologic: negative for headache, dizziness, or syncope All other systems reviewed and are otherwise negative with the exception to those  above and in the HPI.   Physical Exam: Triage Vitals: Blood pressure 100/70, pulse 72, temperature 98.4 F (36.9 C), temperature source Oral, resp. rate 18, height 5\' 5"  (1.651 m), weight 283 lb (128.368 kg), SpO2 97.00%., Body mass index is 47.09 kg/(m^2). General: Well developed, well nourished, in no acute distress. Head: Normocephalic, atraumatic, eyes without discharge, sclera non-icteric, nares are without discharge. Bilateral auditory canals clear, TM's are without perforation, pearly grey and translucent with reflective cone of light bilaterally. Oral cavity moist, posterior pharynx without exudate, erythema, peritonsillar abscess, or post nasal drip.  Neck: Supple. No thyromegaly. Full ROM. No lymphadenopathy. Lungs: Clear bilaterally to auscultation without wheezes, rales, or rhonchi. Breathing is unlabored. Heart: RRR with S1 S2. No murmurs, rubs, or gallops appreciated. Abdomen: Soft, non-tender, non-distended with normoactive bowel sounds. No hepatomegaly. No rebound/guarding. No obvious abdominal masses. Msk:  Strength and tone normal for age. Extremities/Skin: Warm and dry. No clubbing or cyanosis. No edema. No rashes or suspicious lesions. Neuro: Alert and oriented X 3. Moves all extremities  spontaneously. Gait is normal. CNII-XII grossly in tact. Psych:  Responds to questions appropriately with a normal affect.    ASSESSMENT AND PLAN:  52 y.o. year old female with  1. Cough   2. Azotemia   3. Fibromyalgia   4. Gastroparesis    I personally performed the services described in this documentation, which was scribed in my presence. The recorded information has been reviewed and is accurate.  Signed, [name], MD 03/26/2014 12:16 PM

## 2014-03-30 DIAGNOSIS — S134XXD Sprain of ligaments of cervical spine, subsequent encounter: Secondary | ICD-10-CM | POA: Diagnosis not present

## 2014-03-30 DIAGNOSIS — M62830 Muscle spasm of back: Secondary | ICD-10-CM | POA: Diagnosis not present

## 2014-03-30 DIAGNOSIS — G43719 Chronic migraine without aura, intractable, without status migrainosus: Secondary | ICD-10-CM | POA: Diagnosis not present

## 2014-04-01 DIAGNOSIS — S134XXD Sprain of ligaments of cervical spine, subsequent encounter: Secondary | ICD-10-CM | POA: Diagnosis not present

## 2014-04-01 DIAGNOSIS — M62830 Muscle spasm of back: Secondary | ICD-10-CM | POA: Diagnosis not present

## 2014-04-01 DIAGNOSIS — G43719 Chronic migraine without aura, intractable, without status migrainosus: Secondary | ICD-10-CM | POA: Diagnosis not present

## 2014-04-06 DIAGNOSIS — S134XXD Sprain of ligaments of cervical spine, subsequent encounter: Secondary | ICD-10-CM | POA: Diagnosis not present

## 2014-04-06 DIAGNOSIS — G43719 Chronic migraine without aura, intractable, without status migrainosus: Secondary | ICD-10-CM | POA: Diagnosis not present

## 2014-04-06 DIAGNOSIS — M62830 Muscle spasm of back: Secondary | ICD-10-CM | POA: Diagnosis not present

## 2014-04-08 DIAGNOSIS — S134XXD Sprain of ligaments of cervical spine, subsequent encounter: Secondary | ICD-10-CM | POA: Diagnosis not present

## 2014-04-08 DIAGNOSIS — G43719 Chronic migraine without aura, intractable, without status migrainosus: Secondary | ICD-10-CM | POA: Diagnosis not present

## 2014-04-08 DIAGNOSIS — M62830 Muscle spasm of back: Secondary | ICD-10-CM | POA: Diagnosis not present

## 2014-04-14 DIAGNOSIS — S134XXD Sprain of ligaments of cervical spine, subsequent encounter: Secondary | ICD-10-CM | POA: Diagnosis not present

## 2014-04-14 DIAGNOSIS — G43719 Chronic migraine without aura, intractable, without status migrainosus: Secondary | ICD-10-CM | POA: Diagnosis not present

## 2014-04-14 DIAGNOSIS — M62830 Muscle spasm of back: Secondary | ICD-10-CM | POA: Diagnosis not present

## 2014-04-17 DIAGNOSIS — G43719 Chronic migraine without aura, intractable, without status migrainosus: Secondary | ICD-10-CM | POA: Diagnosis not present

## 2014-04-17 DIAGNOSIS — S134XXD Sprain of ligaments of cervical spine, subsequent encounter: Secondary | ICD-10-CM | POA: Diagnosis not present

## 2014-04-17 DIAGNOSIS — M62830 Muscle spasm of back: Secondary | ICD-10-CM | POA: Diagnosis not present

## 2014-04-20 DIAGNOSIS — M62830 Muscle spasm of back: Secondary | ICD-10-CM | POA: Diagnosis not present

## 2014-04-20 DIAGNOSIS — G43719 Chronic migraine without aura, intractable, without status migrainosus: Secondary | ICD-10-CM | POA: Diagnosis not present

## 2014-04-20 DIAGNOSIS — S134XXD Sprain of ligaments of cervical spine, subsequent encounter: Secondary | ICD-10-CM | POA: Diagnosis not present

## 2014-04-21 DIAGNOSIS — M79601 Pain in right arm: Secondary | ICD-10-CM | POA: Diagnosis not present

## 2014-04-21 DIAGNOSIS — M461 Sacroiliitis, not elsewhere classified: Secondary | ICD-10-CM | POA: Diagnosis not present

## 2014-04-21 DIAGNOSIS — F4310 Post-traumatic stress disorder, unspecified: Secondary | ICD-10-CM | POA: Diagnosis not present

## 2014-04-21 DIAGNOSIS — M79604 Pain in right leg: Secondary | ICD-10-CM | POA: Diagnosis not present

## 2014-04-21 DIAGNOSIS — F329 Major depressive disorder, single episode, unspecified: Secondary | ICD-10-CM | POA: Diagnosis not present

## 2014-04-21 DIAGNOSIS — F332 Major depressive disorder, recurrent severe without psychotic features: Secondary | ICD-10-CM | POA: Diagnosis not present

## 2014-04-21 DIAGNOSIS — Z79891 Long term (current) use of opiate analgesic: Secondary | ICD-10-CM | POA: Diagnosis not present

## 2014-04-21 DIAGNOSIS — G4733 Obstructive sleep apnea (adult) (pediatric): Secondary | ICD-10-CM | POA: Diagnosis not present

## 2014-04-21 DIAGNOSIS — M79605 Pain in left leg: Secondary | ICD-10-CM | POA: Diagnosis not present

## 2014-04-21 DIAGNOSIS — K219 Gastro-esophageal reflux disease without esophagitis: Secondary | ICD-10-CM | POA: Diagnosis not present

## 2014-04-21 DIAGNOSIS — M545 Low back pain: Secondary | ICD-10-CM | POA: Diagnosis not present

## 2014-04-21 DIAGNOSIS — E669 Obesity, unspecified: Secondary | ICD-10-CM | POA: Diagnosis not present

## 2014-04-22 DIAGNOSIS — G43719 Chronic migraine without aura, intractable, without status migrainosus: Secondary | ICD-10-CM | POA: Diagnosis not present

## 2014-04-22 DIAGNOSIS — M62830 Muscle spasm of back: Secondary | ICD-10-CM | POA: Diagnosis not present

## 2014-04-22 DIAGNOSIS — S134XXD Sprain of ligaments of cervical spine, subsequent encounter: Secondary | ICD-10-CM | POA: Diagnosis not present

## 2014-04-27 DIAGNOSIS — G43719 Chronic migraine without aura, intractable, without status migrainosus: Secondary | ICD-10-CM | POA: Diagnosis not present

## 2014-04-27 DIAGNOSIS — S134XXD Sprain of ligaments of cervical spine, subsequent encounter: Secondary | ICD-10-CM | POA: Diagnosis not present

## 2014-04-27 DIAGNOSIS — M62830 Muscle spasm of back: Secondary | ICD-10-CM | POA: Diagnosis not present

## 2014-04-30 DIAGNOSIS — M62830 Muscle spasm of back: Secondary | ICD-10-CM | POA: Diagnosis not present

## 2014-04-30 DIAGNOSIS — S134XXD Sprain of ligaments of cervical spine, subsequent encounter: Secondary | ICD-10-CM | POA: Diagnosis not present

## 2014-04-30 DIAGNOSIS — G43719 Chronic migraine without aura, intractable, without status migrainosus: Secondary | ICD-10-CM | POA: Diagnosis not present

## 2014-05-07 ENCOUNTER — Ambulatory Visit (INDEPENDENT_AMBULATORY_CARE_PROVIDER_SITE_OTHER): Payer: Medicare Other | Admitting: Family Medicine

## 2014-05-07 VITALS — BP 126/86 | HR 67 | Temp 98.5°F | Resp 16 | Ht 65.0 in | Wt 281.0 lb

## 2014-05-07 DIAGNOSIS — M546 Pain in thoracic spine: Secondary | ICD-10-CM

## 2014-05-07 DIAGNOSIS — D649 Anemia, unspecified: Secondary | ICD-10-CM

## 2014-05-07 DIAGNOSIS — E785 Hyperlipidemia, unspecified: Secondary | ICD-10-CM | POA: Diagnosis not present

## 2014-05-07 DIAGNOSIS — I10 Essential (primary) hypertension: Secondary | ICD-10-CM

## 2014-05-07 DIAGNOSIS — R002 Palpitations: Secondary | ICD-10-CM

## 2014-05-07 LAB — POCT SEDIMENTATION RATE: POCT SED RATE: 55 mm/hr — AB (ref 0–22)

## 2014-05-07 NOTE — Patient Instructions (Signed)

## 2014-05-07 NOTE — Progress Notes (Signed)
53 year old woman who spends her time working at [employer]. 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  She is here because she's had problems with her back. She is currently doing much better and would like a refill on her physical therapy consultation with Barbaraann Barthel.  In addition, she needs blood work for her other problems.  Overall she seems to be doing well. [place] where she had her chronic pain medication renewed. There were no issues at that time.  Patient also notes some palpitations recently over the last 3 or 4 days. She describes this as 5 minutes of her heart beating extra hard but not fast. She has a history of PAT.  Objective: No acute distress, patient seems much happier than usual.  HEENT: Unremarkable Skin: Clear Neck: Supple no adenopathy or thyromegaly Chest: Clear Heart: Regular no murmur or gallop Extremities: No edema  Assessment: It's unclear what to make of these new palpitations. She's doing better overall and she does tend to somaticize.  Plan:Midline thoracic back pain - Plan: Ambulatory referral to Physical Therapy  Essential hypertension - Plan: CBC with Differential, Comprehensive metabolic panel  Anemia, unspecified anemia type - Plan: CBC with Differential  Palpitations - Plan: POCT SEDIMENTATION RATE, TSH, Lipid panel  Hyperlipidemia - Plan: Lipid panel Will hold off on the cardiology consult for now because the symptoms are so random and of such short duration Signed, [name], MD

## 2014-05-08 LAB — LIPID PANEL
Cholesterol: 166 mg/dL (ref 0–200)
HDL: 43 mg/dL (ref >39)
LDL Cholesterol: 88 mg/dL (ref 0–99)
Total CHOL/HDL Ratio: 3.9 Ratio
Triglycerides: 177 mg/dL — ABNORMAL HIGH (ref <150)
VLDL: 35 mg/dL (ref 0–40)

## 2014-05-08 LAB — COMPREHENSIVE METABOLIC PANEL
ALT: 13 U/L (ref 0–35)
AST: 14 U/L (ref 0–37)
Albumin: 4.4 g/dL (ref 3.5–5.2)
Alkaline Phosphatase: 45 U/L (ref 39–117)
BUN: 21 mg/dL (ref 6–23)
CO2: 26 mEq/L (ref 19–32)
Calcium: 9.9 mg/dL (ref 8.4–10.5)
Chloride: 104 mEq/L (ref 96–112)
Creat: 1.19 mg/dL — ABNORMAL HIGH (ref 0.50–1.10)
Glucose, Bld: 127 mg/dL — ABNORMAL HIGH (ref 70–99)
Potassium: 5.1 mEq/L (ref 3.5–5.3)
Sodium: 137 mEq/L (ref 135–145)
Total Bilirubin: 0.4 mg/dL (ref 0.2–1.2)
Total Protein: 7.5 g/dL (ref 6.0–8.3)

## 2014-05-08 LAB — CBC WITH DIFFERENTIAL/PLATELET
Basophils Absolute: 0 10*3/uL (ref 0.0–0.1)
Basophils Relative: 0 % (ref 0–1)
Eosinophils Absolute: 0.2 10*3/uL (ref 0.0–0.7)
Eosinophils Relative: 3 % (ref 0–5)
HCT: 38.8 % (ref 36.0–46.0)
Hemoglobin: 12.7 g/dL (ref 12.0–15.0)
Lymphocytes Relative: 28 % (ref 12–46)
Lymphs Abs: 1.9 10*3/uL (ref 0.7–4.0)
MCH: 30 pg (ref 26.0–34.0)
MCHC: 32.7 g/dL (ref 30.0–36.0)
MCV: 91.7 fL (ref 78.0–100.0)
MPV: 10.4 fL (ref 9.4–12.4)
Monocytes Absolute: 0.3 10*3/uL (ref 0.1–1.0)
Monocytes Relative: 5 % (ref 3–12)
Neutro Abs: 4.3 10*3/uL (ref 1.7–7.7)
Neutrophils Relative %: 64 % (ref 43–77)
Platelets: 315 10*3/uL (ref 150–400)
RBC: 4.23 MIL/uL (ref 3.87–5.11)
RDW: 13.9 % (ref 11.5–15.5)
WBC: 6.7 10*3/uL (ref 4.0–10.5)

## 2014-05-08 LAB — TSH: TSH: 0.867 u[IU]/mL (ref 0.350–4.500)

## 2014-06-02 ENCOUNTER — Ambulatory Visit: Payer: Medicare Other

## 2014-06-09 ENCOUNTER — Other Ambulatory Visit: Payer: Self-pay | Admitting: Family Medicine

## 2014-06-10 ENCOUNTER — Other Ambulatory Visit: Payer: Self-pay | Admitting: Family Medicine

## 2014-06-10 NOTE — Telephone Encounter (Signed)
Dr L, you saw pt last month, but don't see this med discussed recently. Do you want to give RFs?

## 2014-06-25 ENCOUNTER — Encounter: Payer: Self-pay | Admitting: Family Medicine

## 2014-06-25 ENCOUNTER — Ambulatory Visit (INDEPENDENT_AMBULATORY_CARE_PROVIDER_SITE_OTHER): Payer: Medicare Other | Admitting: Family Medicine

## 2014-06-25 VITALS — BP 144/86 | HR 87 | Temp 98.9°F | Resp 16 | Ht 65.5 in | Wt 291.8 lb

## 2014-06-25 DIAGNOSIS — J3489 Other specified disorders of nose and nasal sinuses: Secondary | ICD-10-CM | POA: Diagnosis not present

## 2014-06-25 DIAGNOSIS — R002 Palpitations: Secondary | ICD-10-CM

## 2014-06-25 DIAGNOSIS — M797 Fibromyalgia: Secondary | ICD-10-CM | POA: Diagnosis not present

## 2014-06-25 MED ORDER — FLUCONAZOLE 150 MG PO TABS: 150.0000 mg | ORAL_TABLET | Freq: Once | ORAL | Status: DC

## 2014-06-25 MED ORDER — AMOXICILLIN 875 MG PO TABS: 875.0000 mg | ORAL_TABLET | Freq: Two times a day (BID) | ORAL | Status: DC

## 2014-06-25 MED ORDER — METHOCARBAMOL 500 MG PO TABS: 500.0000 mg | ORAL_TABLET | Freq: Four times a day (QID) | ORAL | Status: DC

## 2014-06-25 NOTE — Patient Instructions (Signed)

## 2014-06-25 NOTE — Addendum Note (Signed)
Addended by: Alfredia Ferguson A on: 06/25/2014 02:28 PM   Modules accepted: Orders

## 2014-06-25 NOTE — Progress Notes (Signed)
54 yo woman.  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  Her back has been a problem lately. She hopes to see Dr. Belia Heman soon.  Other activities:  [employer], care for friend's dogs.  Palpitations are less frequent.  No signs of kidney problems.  Takes furosemide twice a week prn.  Had sinus infection over Christmas.  Feeling more sinus symptoms lately  Objective:  NAD, neck supple Talkative, good eye contact Nose: dried blood right nasal passage Maxillae:  Mildly tender Oroph:  Swollen red tonsils Neck:  No adenopathy Chest:  Clear Heart:  Reg, no murmur Skin:  Clear  Assessment:  Sinusitis, palpitations better.  This chart was scribed in my presence and reviewed by me personally.    ICD-9-CM ICD-10-CM   1. Fibromyalgia 729.1 M79.7 methocarbamol (ROBAXIN) 500 MG tablet  2. Palpitations 785.1 R00.2   3. Sinus pain 478.19 J34.89 amoxicillin (AMOXIL) 875 MG tablet     fluconazole (DIFLUCAN) 150 MG tablet     Signed, [name], MD

## 2014-07-20 DIAGNOSIS — M62830 Muscle spasm of back: Secondary | ICD-10-CM | POA: Diagnosis not present

## 2014-07-22 DIAGNOSIS — K219 Gastro-esophageal reflux disease without esophagitis: Secondary | ICD-10-CM | POA: Diagnosis not present

## 2014-07-22 DIAGNOSIS — M461 Sacroiliitis, not elsewhere classified: Secondary | ICD-10-CM | POA: Diagnosis not present

## 2014-07-22 DIAGNOSIS — G4733 Obstructive sleep apnea (adult) (pediatric): Secondary | ICD-10-CM | POA: Diagnosis not present

## 2014-07-22 DIAGNOSIS — M79604 Pain in right leg: Secondary | ICD-10-CM | POA: Diagnosis not present

## 2014-07-22 DIAGNOSIS — M545 Low back pain: Secondary | ICD-10-CM | POA: Diagnosis not present

## 2014-07-22 DIAGNOSIS — M79605 Pain in left leg: Secondary | ICD-10-CM | POA: Diagnosis not present

## 2014-07-22 DIAGNOSIS — E669 Obesity, unspecified: Secondary | ICD-10-CM | POA: Diagnosis not present

## 2014-07-22 DIAGNOSIS — M79641 Pain in right hand: Secondary | ICD-10-CM | POA: Diagnosis not present

## 2014-07-22 DIAGNOSIS — F329 Major depressive disorder, single episode, unspecified: Secondary | ICD-10-CM | POA: Diagnosis not present

## 2014-07-22 DIAGNOSIS — Z79891 Long term (current) use of opiate analgesic: Secondary | ICD-10-CM | POA: Diagnosis not present

## 2014-07-22 DIAGNOSIS — F4310 Post-traumatic stress disorder, unspecified: Secondary | ICD-10-CM | POA: Diagnosis not present

## 2014-07-28 DIAGNOSIS — M62830 Muscle spasm of back: Secondary | ICD-10-CM | POA: Diagnosis not present

## 2014-07-30 DIAGNOSIS — M62830 Muscle spasm of back: Secondary | ICD-10-CM | POA: Diagnosis not present

## 2014-08-04 DIAGNOSIS — M62830 Muscle spasm of back: Secondary | ICD-10-CM | POA: Diagnosis not present

## 2014-08-07 DIAGNOSIS — M62830 Muscle spasm of back: Secondary | ICD-10-CM | POA: Diagnosis not present

## 2014-08-13 DIAGNOSIS — M62830 Muscle spasm of back: Secondary | ICD-10-CM | POA: Diagnosis not present

## 2014-08-17 DIAGNOSIS — M62830 Muscle spasm of back: Secondary | ICD-10-CM | POA: Diagnosis not present

## 2014-08-19 ENCOUNTER — Ambulatory Visit (INDEPENDENT_AMBULATORY_CARE_PROVIDER_SITE_OTHER): Payer: Medicare Other | Admitting: Neurology

## 2014-08-19 ENCOUNTER — Encounter: Payer: Self-pay | Admitting: Neurology

## 2014-08-19 DIAGNOSIS — Z9989 Dependence on other enabling machines and devices: Secondary | ICD-10-CM

## 2014-08-19 DIAGNOSIS — M791 Myalgia, unspecified site: Secondary | ICD-10-CM

## 2014-08-19 DIAGNOSIS — G4733 Obstructive sleep apnea (adult) (pediatric): Secondary | ICD-10-CM

## 2014-08-19 MED ORDER — MODAFINIL 200 MG PO TABS: 100.0000 mg | ORAL_TABLET | Freq: Two times a day (BID) | ORAL | Status: DC

## 2014-08-19 NOTE — Progress Notes (Signed)
[facility]  Provider:  Dr Torey Reinard Referring Provider: [name], MD Primary Care Physician:  [name], MD  Chief Complaint  Patient presents with  . RV cpap    Rm 10, alone  This patient's primary neurologist is Dr. Floyde Parkins.    HPI:  Patient has  being admitted to hospital for ischemic colitis in Nov 2013 , none since. The patient reports a history of snoring and daytime excessive sleepiness and was with the same complaints evaluated 4 years ago for  sleep apnea [facility]).The overall AHI was 46.4 , the patient had 8 obstructive apneas nor mixed apneas , 1 central apnea and 122 hypopneas.  Oxygen desaturations to a nadir of 64% . The patient was titrated to CPAP , received a recommendation to use CPAP at 9 cm water pressure.   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 Joseph Art for  OSA , diagnosed after a  sleep study on  12-22-2008 at [facility]. She underwent a SPLIT study 02-18-13  here at [place] . The patient underwent a retitration here at [facility] lab  on 9-9 2014,  with Dr. Joseph Art listed as the referring physician.  She could not tolerate the CPAP settings that she had moved here with ( from [place]  , [place]  [phone].  At the time at [facility] she was titrated to 9 cm water- now for night use later re\re titration resulted in a pressure of 20 and an AHI of 20.6 RDI of 33.8 REM AHI of 21.2. There was no tachybradycardia arrhythmia and altered no prolonged desaturation altered, and no periodic limb movements seen. The patient was now actually titrated to 12 cm water she is now using a ResMed Mirage mask in a small size and uses heated humidity. The machine is the same she had 2010 .  Mrs. Tarleton will need a prescription for the full facemask that she has used before  and that she prefers using in times of nasal allergic rhinitis. She also will have cushions refilled for her nasal pillow mask. She reports that over the last week for 2 weeks she has become excessively daytime sleepy again currently she endorsed the Epworth sleepiness score on at 13 points and the fatigue severity score at 53-54 points. This is dated 08-19-14 Her sleep habits and social habits have not changed in any way to explain the increase in daytime sleepiness and fatigue. She takes Zyrtec in the morning during her allergy season which should not give her this degree of sleepiness. She had no infection or inflammatory disorders that she is aware of. He is easier short of breath and she does have some underlying wheezing which have been mentioned by Dr. Verline Lema. She never had wheezing before 2015 there of. This may be an asthmatic component and she has Proventil as an inhaler available .her voice in the choir is affected.  her endurance span is shorter.  Social history :  She drinks caffeinated teas , 2-3 glasses at lunch and dinner. Juice for breakfast . She still naps in daytime, often right after taken modafinil.  Goes to bed around 11 PM, takes 30 minutes to 1 hour to  fall asleep.  Reads in bed, no TV.  CPAP has actually helped this a bit. Has one bathroom break at night ,  sleeps usually to 10 AM- 6-7 hours total sleep time. Sleep hygiene needs still improvement.  Dreams seldomly, not frequently, not vivid.  CPAP Download. : 08-19-14; The patient is using a C-Flex setting she is 100% compliance from 07-20-14 through 08-18-14. 100% over 4 hours of use was at average use of 7 hours 56 minutes. 93% of compliance. Average  AHI is 3.8,  which is sufficient. CPAP pressure is set at 12 cm water.   ROS , 08-19-14  muscle achiness, soreness, and can be painful., morbidly obese. Pain in hand and feet. Fibromyalgia/ she has seen Dr Jannifer Franklin and rheumatology .  Intolerant to SSRI. pancreatitis and incontinence ,  stool and urine. Interstitial cystitis, cataracts. CPAP  Sleep : feels more restored , rested after changes in  CPAP settings, but Epworth still elevated and FSS , too..takes naps.  The patient endorsed the Epworth sleepiness score today , November.4th 2014 , at 13.5 points.  And fatigue severity at 58 points. She states that she is using Provigil with some success .  The medication helps , but she has taken a whole 200 mg tabs modafinil generic.   CPAP Download. : 08-19-14; The patient is using a C-Flex setting she is 100% compliance from 07-20-14 through 08-18-14. 100% over 4 hours of use was at average use of 7 hours 56 minutes. 93% of compliance. Average  AHI is 3.8,  which is sufficient. CPAP pressure is set at 12 cm water.     Last note : Dr. Jannifer Franklin ; ;2 years ago the patient has morbid obesity, fibromyalgia, interstitial cystitis, hypertension, elevated uric acid levels, gastroesophageal reflux gastroparesis, sleep apnea, pancreatitis,  the she has seen Dr. Jannifer Franklin for a left leg pain and lower back pain as well as right upper extremity discomfort in the past she also has degenerative disc disease, degenerative arthritis, status post sinus surgery and has asthma.   DME : [facility] but has not received a call back- he needs to get her new tubing, filter and Mask.   She needs both liners refilled for FFM and nasal pillow.   [employer] CPAP  machine.            Current Outpatient Prescriptions  Medication Sig Dispense Refill  . albuterol (PROVENTIL HFA;VENTOLIN HFA) 108 (90 BASE) MCG/ACT inhaler Inhale 2 puffs into the lungs as needed for wheezing or shortness of breath.    . allopurinol (ZYLOPRIM) 300 MG tablet Take 150 mg by mouth daily.    Marland Kitchen ALPRAZolam (XANAX) 1 MG tablet Take 1 mg by mouth daily as needed for anxiety.    . benazepril (LOTENSIN) 40 MG tablet Take 40 mg by mouth daily.    . Biotin 5000 MCG CAPS Take 10,000 mcg by mouth daily.     .  butalbital-acetaminophen-caffeine (FIORICET, ESGIC) 50-325-40 MG per tablet Take 1-2 tablets by mouth 2 (two) times daily as needed for headache or migraine.    . cetirizine (ZYRTEC) 10 MG tablet Take 10 mg by mouth daily.    . Cholecalciferol (VITAMIN D) 2000 UNITS tablet Take 2,000 Units by mouth daily.    . furosemide (LASIX) 20 MG tablet Take 20 mg by mouth. Every other day prn    . HYDROcodone-acetaminophen (NORCO) 10-325 MG per tablet Take 1-2 tablets by mouth every 6 (six) hours as needed (pain.).    Marland Kitchen ibuprofen (ADVIL,MOTRIN) 600 MG tablet Take 600 mg by mouth every 6 (six) hours as needed for mild pain.     Marland Kitchen  methocarbamol (ROBAXIN) 500 MG tablet Take 1 tablet (500 mg total) by mouth 4 (four) times daily. (Patient taking differently: Take 500 mg by mouth 4 (four) times daily as needed. ) 60 tablet 3  . metoprolol succinate (TOPROL-XL) 100 MG 24 hr tablet Take 100 mg by mouth daily. Take with or immediately following a meal.    . modafinil (PROVIGIL) 200 MG tablet Take 100 mg by mouth 2 (two) times daily.     Marland Kitchen morphine (MS CONTIN) 30 MG 12 hr tablet Take 30 mg by mouth every 8 (eight) hours. Not from [place]    . Multiple Vitamins-Minerals (MULTIVITAMIN PO) Take 1 tablet by mouth daily.    Marland Kitchen omeprazole (PRILOSEC) 40 MG capsule Take 40 mg by mouth 2 (two) times daily.    . Pancrelipase, Lip-Prot-Amyl, (CREON) 24000 UNITS CPEP Take 48,000 Units by mouth 4 (four) times daily as needed (for pancreatitis).    Marland Kitchen PATADAY 0.2 % SOLN APPLY 1 DROP TO EYE 2 (TWO) TIMES DAILY. 2.5 mL 5  . promethazine (PHENERGAN) 25 MG tablet Take 25 mg by mouth every 6 (six) hours as needed for nausea or vomiting.    Marland Kitchen spironolactone (ALDACTONE) 100 MG tablet Take 100 mg by mouth daily.     No current facility-administered medications for this visit.    Allergies as of 08/19/2014 - Review Complete 08/19/2014  Allergen Reaction Noted  . Dilaudid [hydromorphone hcl] Other (See Comments) 08/01/2011  . Pregabalin   08/01/2011  . Metoclopramide hcl Other (See Comments) 08/01/2011  . Neurontin [gabapentin] Other (See Comments) 08/01/2011  . Other Other (See Comments) 08/01/2011  . Reglan [metoclopramide]  10/21/2012    Vitals: BP 120/75 mmHg  Pulse 76  Resp 16  Ht 5\' 4"  (1.626 m)  Wt 296 lb (134.265 kg)  BMI 50.78 kg/m2 Last Weight:  Wt Readings from Last 1 Encounters:  08/19/14 296 lb (134.265 kg)   Last Height:   Ht Readings from Last 1 Encounters:  08/19/14 5\' 4"  (1.626 m)   Vision Screening:  Vitals   Physical exam:  General: The patient is awake, alert and appears not in acute distress. The patient is well groomed. Head: Normocephalic, atraumatic. Neck is supple. Large neck but low gradeupper airway restriction. Mallampati 1, narrow laterally , but high , peaked palate - short airway , may have trouble with intubation.   neck circumference:18 inches! . Cardiovascular:  Regular rate and rhythm, without  murmurs or carotid bruit, and without distended neck veins. Respiratory: Lungs are clear to auscultation. Skin:  Without evidence of edema, or rash Trunk: BMI is severely elevated , but  patient has normal posture. morbidly obese.   Neurologic exam : The patient is awake and alert, oriented to place and time.  Memory subjective  described as intact. There is a normal attention span & concentration ability.  Speech is fluent without dysarthria, dysphonia or aphasia. Mood and affect are appropriate.  Cranial nerves: Pupils are equal and briskly reactive to light. Funduscopic exam without evidence of pallor  (or edema). Extraocular movements  in vertical and horizontal planes intact and without nystagmus . Visual fields by finger perimetry are intact. Hearing to finger rub intact.   Facial sensation intact to fine touch. Facial motor strength is symmetric and tongue and uvula move midline.  Motor exam:   Normal tone and normal muscle bulk and symmetric normal strength in all  extremities. ROM limitation in  Right hip,  Left and right shoulder.   Sensory:  Fine touch, pinprick and vibration were tested in all extremities. Proprioception is tested in the upper extremities only. This was normal.  Coordination: Rapid alternating movements in the fingers/hands is tested and normal.  Gait and station: Patient walks without assistive device and is able unassisted to climb up to the exam table.  Deep tendon reflexes: in the  upper and lower extremities are symmetric and intact. Babinski maneuver response is downgoing.   Assessment:  After physical and neurologic examination, review of laboratory studies, imaging, neurophysiology testing and pre-existing records, assessment is that :  1) OSA with CPAP at 12 cm water. Large neck , BMI.   2) peripheral neuropathy, brachial plexus injury - seen by Dr. Jannifer Franklin. Not improved . 3) obesity, main OSA risk factor.  Neurosurgeon.   Plan:  Continue CPAP and obtain new supplies through your DME - CHOICE .  For hose-tube, filter.  Bring CPAP to all appoinmtents. Good tolerance of CPAP, " i never sleep without it "   Please note weight reduction plan attached . V  Visit 25 minutes with obtaining CPAP compliance and therapeutic data in office.

## 2014-08-19 NOTE — Patient Instructions (Signed)
Recommend to continue seeing a counselor for the PTSD and depression. The patient is intolerant of SSRI medications which limit her prescription choices for the treatment of fibromyalgia as well. She has been seen a rheumatologist in the past but could not tolerate Lyrica, gabapentin, Effexor.

## 2014-08-20 DIAGNOSIS — M62830 Muscle spasm of back: Secondary | ICD-10-CM | POA: Diagnosis not present

## 2014-08-21 ENCOUNTER — Encounter: Payer: Self-pay | Admitting: Neurology

## 2014-08-24 DIAGNOSIS — M62830 Muscle spasm of back: Secondary | ICD-10-CM | POA: Diagnosis not present

## 2014-08-27 ENCOUNTER — Encounter: Payer: Self-pay | Admitting: Family Medicine

## 2014-08-27 ENCOUNTER — Ambulatory Visit (INDEPENDENT_AMBULATORY_CARE_PROVIDER_SITE_OTHER): Payer: Medicare Other | Admitting: Family Medicine

## 2014-08-27 ENCOUNTER — Telehealth: Payer: Self-pay | Admitting: *Deleted

## 2014-08-27 VITALS — BP 115/81 | HR 90 | Temp 98.3°F | Resp 16 | Ht 64.0 in | Wt 292.4 lb

## 2014-08-27 DIAGNOSIS — Z889 Allergy status to unspecified drugs, medicaments and biological substances status: Secondary | ICD-10-CM

## 2014-08-27 DIAGNOSIS — F411 Generalized anxiety disorder: Secondary | ICD-10-CM

## 2014-08-27 DIAGNOSIS — M546 Pain in thoracic spine: Secondary | ICD-10-CM

## 2014-08-27 DIAGNOSIS — M62830 Muscle spasm of back: Secondary | ICD-10-CM | POA: Diagnosis not present

## 2014-08-27 MED ORDER — MONTELUKAST SODIUM 10 MG PO TABS
10.0000 mg | ORAL_TABLET | Freq: Every day | ORAL | Status: DC
Start: 2014-08-27 — End: 2014-11-17

## 2014-08-27 MED ORDER — ALPRAZOLAM 1 MG PO TABS: 1.0000 mg | ORAL_TABLET | Freq: Three times a day (TID) | ORAL | Status: DC | PRN

## 2014-08-27 MED ORDER — METHYLPREDNISOLONE ACETATE 40 MG/ML IJ SUSP
40.0000 mg | Freq: Once | INTRAMUSCULAR | Status: AC
  Administered 2014-08-27: 40 mg via INTRAMUSCULAR

## 2014-08-27 MED ORDER — BACLOFEN 10 MG PO TABS: 10.0000 mg | ORAL_TABLET | Freq: Three times a day (TID) | ORAL | Status: DC

## 2014-08-27 NOTE — Progress Notes (Addendum)
Patient ID: Valerie [name], female   DOB: June 04, 1961, 54 y.o.   MRN: [id number]  This chart was scribed for Valerie Haber, MD by Ladene Artist, ED Scribe. The patient was seen in room 27. Patient's care was started at 1:13 PM.  Patient ID: Valerie [name] MRN: [id number], DOB: 11-28-1960, 54 y.o. Date of Encounter: 08/27/2014, 1:41 PM  Primary Physician: Valerie Haber, MD  Chief Complaint  Patient presents with  . Follow-up  . Back Pain  . Fibromyalgia    HPI: 54 y.o. year old female with history below presents for a follow-up. Pt was seen in 07/2014 by her pain clinic. No new approaches or strategies.   Back Pain Pt reports persistent R upper back pain for the past few weeks. Pain is exacerbated with touching and movement. She states that pain was so severe 3 days ago that she could hardly turn her neck. Pt reports that she has been doing exercises as directed.   Fibromyalgia  Pt states that she has been taking Robaxin as prescribed with very little relief. She reports that she doubled her Baclofen, which provided more relief than Robaxin. Pt states that she only does this as needed.   Anxiety Patient states she needs a refill on the xanax.  It is working well, but she ran out  Past Medical History  Diagnosis Date  . Hypertension   . Anxiety   . Mental disorder   . Depression   . Sleep apnea   . Headache(784.0)   . Neuromuscular disorder   . 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 Fibromyalgia   . Arthritis     degenerative  . Anemia 05/01/2012  . Gastro-esophageal reflux   . Sleep apnea     CPAP-9  . Pancreatitis   . Diarrhea     EPISODIC  . Back pain     lower     Home Meds: Prior to Admission medications   Medication Sig Start Date End Date Taking? Authorizing Provider  albuterol (PROVENTIL HFA;VENTOLIN HFA) 108 (90 BASE) MCG/ACT inhaler Inhale 2 puffs into the lungs as needed for wheezing or shortness of breath. 02/03/14   Valerie Haber, MD    allopurinol (ZYLOPRIM) 300 MG tablet Take 150 mg by mouth daily.    Historical Provider, MD  ALPRAZolam Duanne Moron) 1 MG tablet Take 1 mg by mouth daily as needed for anxiety.    Historical Provider, MD  benazepril (LOTENSIN) 40 MG tablet Take 40 mg by mouth daily.    Historical Provider, MD  Biotin 5000 MCG CAPS Take 10,000 mcg by mouth daily.     Historical Provider, MD  butalbital-acetaminophen-caffeine (FIORICET, ESGIC) 50-325-40 MG per tablet Take 1-2 tablets by mouth 2 (two) times daily as needed for headache or migraine.    Historical Provider, MD  cetirizine (ZYRTEC) 10 MG tablet Take 10 mg by mouth daily.    Historical Provider, MD  Cholecalciferol (VITAMIN D) 2000 UNITS tablet Take 2,000 Units by mouth daily.    Historical Provider, MD  furosemide (LASIX) 20 MG tablet Take 20 mg by mouth. Every other day prn 11/07/13   Valerie Haber, MD  HYDROcodone-acetaminophen [facility]) 10-325 MG per tablet Take 1-2 tablets by mouth every 6 (six) hours as needed (pain.).    Historical Provider, MD  ibuprofen (ADVIL,MOTRIN) 600 MG tablet Take 600 mg by mouth every 6 (six) hours as needed for mild pain.     Historical Provider, MD  methocarbamol (ROBAXIN) 500 MG  tablet Take 1 tablet (500 mg total) by mouth 4 (four) times daily. Patient taking differently: Take 500 mg by mouth 4 (four) times daily as needed.  06/25/14   Valerie Haber, MD  metoprolol succinate (TOPROL-XL) 100 MG 24 hr tablet Take 100 mg by mouth daily. Take with or immediately following a meal.    Historical Provider, MD  modafinil (PROVIGIL) 200 MG tablet Take 0.5 tablets (100 mg total) by mouth 2 (two) times daily. 08/19/14   Asencion Partridge Dohmeier, MD  morphine (MS CONTIN) 30 MG 12 hr tablet Take 30 mg by mouth every 8 (eight) hours. Not from [facility] 09/23/12   Historical Provider, MD  Multiple Vitamins-Minerals (MULTIVITAMIN PO) Take 1 tablet by mouth daily.    Historical Provider, MD  omeprazole (PRILOSEC) 40 MG capsule Take 40 mg by mouth 2 (two)  times daily.    Historical Provider, MD  Pancrelipase, Lip-Prot-Amyl, (CREON) 24000 UNITS CPEP Take 48,000 Units by mouth 4 (four) times daily as needed (for pancreatitis).    Historical Provider, MD  PATADAY 0.2 % SOLN APPLY 1 DROP TO EYE 2 (TWO) TIMES DAILY. 06/11/14   Valerie Haber, MD  promethazine (PHENERGAN) 25 MG tablet Take 25 mg by mouth every 6 (six) hours as needed for nausea or vomiting.    Historical Provider, MD  spironolactone (ALDACTONE) 100 MG tablet Take 100 mg by mouth daily.    Historical Provider, MD    Allergies:  Allergies  Allergen Reactions  . Dilaudid [Hydromorphone Hcl] Other (See Comments)    Headache   . Pregabalin   . Metoclopramide Hcl Other (See Comments)    "crawling inside"  . Neurontin [Gabapentin] Other (See Comments)    Tremors   . Other Other (See Comments)    Avoid all SSRI's and SNRI's- damaged pancreas  . Reglan [Metoclopramide]     History   Social History  . Marital Status: Single    Spouse Name: N/A  . Number of Children: 0  . Years of Education: College   Occupational History  . disabled    Social History Main Topics  . Smoking status: Never Smoker   . Smokeless tobacco: Never Used  . Alcohol Use: No  . Drug Use: No  . Sexual Activity: Yes   Other Topics Concern  . Not on file   Social History Narrative   Caffeine 2 cups iced tea daily avg.     Review of Systems: Constitutional: negative for chills, fever, night sweats, weight changes, or fatigue  HEENT: negative for vision changes, hearing loss, congestion, rhinorrhea, ST, epistaxis, or sinus pressure Cardiovascular: negative for chest pain or palpitations Respiratory: negative for hemoptysis, wheezing, shortness of breath, or cough Abdominal: negative for abdominal pain, nausea, vomiting, diarrhea, or constipation Msk: + back pain Dermatological: negative for rash Neurologic: negative for headache, dizziness, or syncope All other systems reviewed and are  otherwise negative with the exception to those above and in the HPI.  Physical Exam: Blood pressure 115/81, pulse 90, temperature 98.3 F (36.8 C), temperature source Oral, resp. rate 16, height 5\' 4"  (1.626 m), weight 292 lb 6.4 oz (132.632 kg), SpO2 98 %., Body mass index is 50.17 kg/(m^2). General: Well developed, well nourished, in no acute distress. Head: Normocephalic, atraumatic, eyes without discharge, sclera non-icteric, nares are without discharge. Bilateral auditory canals clear, TM's are without perforation, pearly grey and translucent with reflective cone of light bilaterally. Oral cavity moist, posterior pharynx without exudate, erythema, peritonsillar abscess, or post nasal drip.  Neck:  Supple. No thyromegaly. Full ROM. No lymphadenopathy. Lungs: Clear bilaterally to auscultation without wheezes, rales, or rhonchi. Breathing is unlabored. Heart: RRR with S1 S2. No murmurs, rubs, or gallops appreciated. Abdomen: Soft, non-tender, non-distended with normoactive bowel sounds. No hepatomegaly. No rebound/guarding. No obvious abdominal masses. Msk:  Strength and tone normal for age. Tender along the R medial scapula border.  Extremities/Skin: Warm and dry. No clubbing or cyanosis. No edema. No rashes or suspicious lesions. Neuro: Alert and oriented X 3. Moves all extremities spontaneously. Gait is normal. CNII-XII grossly in tact. Psych:  Responds to questions appropriately with a normal affect.  Right paraspinal thoracic injection with good relief  ASSESSMENT AND PLAN:  55 y.o. year old female with  1. Midline thoracic back pain   2. Anxiety state   3. H/O seasonal allergies    This chart was scribed in my presence and reviewed by me personally.    ICD-9-CM ICD-10-CM   1. Midline thoracic back pain 724.1 M54.6 methylPREDNISolone acetate (DEPO-MEDROL) injection 40 mg     baclofen (LIORESAL) 10 MG tablet     CANCELED: Inject trigger point, 1 or 2  2. Anxiety state 300.00 F41.1  ALPRAZolam (XANAX) 1 MG tablet  3. H/O seasonal allergies V15.09 Z88.9 montelukast (SINGULAIR) 10 MG tablet     Signed, Valerie Haber, MD   Signed, Valerie Haber, MD 08/27/2014 1:41 PM

## 2014-08-27 NOTE — Telephone Encounter (Signed)
Faxed signed documents, per Dr Joseph Art. Confirmation page received at 9:31 am.

## 2014-09-01 DIAGNOSIS — M62830 Muscle spasm of back: Secondary | ICD-10-CM | POA: Diagnosis not present

## 2014-09-07 DIAGNOSIS — M62830 Muscle spasm of back: Secondary | ICD-10-CM | POA: Diagnosis not present

## 2014-09-10 DIAGNOSIS — M62830 Muscle spasm of back: Secondary | ICD-10-CM | POA: Diagnosis not present

## 2014-09-14 DIAGNOSIS — M62830 Muscle spasm of back: Secondary | ICD-10-CM | POA: Diagnosis not present

## 2014-09-17 DIAGNOSIS — M62830 Muscle spasm of back: Secondary | ICD-10-CM | POA: Diagnosis not present

## 2014-09-20 ENCOUNTER — Ambulatory Visit (INDEPENDENT_AMBULATORY_CARE_PROVIDER_SITE_OTHER): Payer: Medicare Other | Admitting: Family Medicine

## 2014-09-20 VITALS — BP 128/82 | HR 82 | Temp 98.0°F | Resp 17 | Ht 65.5 in | Wt 296.0 lb

## 2014-09-20 DIAGNOSIS — R51 Headache: Secondary | ICD-10-CM | POA: Diagnosis not present

## 2014-09-20 DIAGNOSIS — T3695XA Adverse effect of unspecified systemic antibiotic, initial encounter: Secondary | ICD-10-CM

## 2014-09-20 DIAGNOSIS — R519 Headache, unspecified: Secondary | ICD-10-CM

## 2014-09-20 DIAGNOSIS — B379 Candidiasis, unspecified: Secondary | ICD-10-CM

## 2014-09-20 MED ORDER — FLUCONAZOLE 150 MG PO TABS: 150.0000 mg | ORAL_TABLET | Freq: Once | ORAL | Status: DC

## 2014-09-20 MED ORDER — AMOXICILLIN 875 MG PO TABS: 875.0000 mg | ORAL_TABLET | Freq: Two times a day (BID) | ORAL | Status: DC

## 2014-09-20 NOTE — Progress Notes (Signed)
   Subjective:  This chart was scribed for [name], MD, by Starleen Arms, Medical Scribe. This patient was seen in room Rm 9 and the patient's care was started at 9:01 AM.    Patient ID: Valerie [name], female    DOB: 01-18-1961, 54 y.o.   MRN: [id number]   HPI HPI Comments: Valerie [name] is a 54 y.o. female who presents to [facility] complaining of a worsening global headache described as pressure with associated photophobia onset 3 weeks ago.  Patient reports the complaint worsened markedly 4 days ago and her associated nausea, which is relieved by eating, onset at that time.  She additionally notes some mild right-sided sinus pain.  Patient reports her headache typically recurs daily after walking a few steps in the morning and persists until bedtime.  It does not typically wake her from sleep.  Patient has taken ibuprofen, Lortab, fioricet, xanax.    Patient also notes some ear fullness in the mornings on whichever side she has slept on.  Patient is taking Zyrtec and Singulair for allergies    Review of Systems  Eyes: Positive for photophobia.  Gastrointestinal: Positive for nausea.  Neurological: Positive for headaches.       Objective:   Physical Exam  Constitutional: She is oriented to person, place, and time. She appears well-developed and well-nourished. No distress.  HENT:  Head: Normocephalic and atraumatic.  Eyes: Conjunctivae and EOM are normal.  Neck: Neck supple. No tracheal deviation present.  Cardiovascular: Normal rate.   Pulmonary/Chest: Effort normal. No respiratory distress.  Musculoskeletal: Normal range of motion.  Neurological: She is alert and oriented to person, place, and time.  Skin: Skin is warm and dry.  Psychiatric: She has a normal mood and affect. Her behavior is normal.  Nursing note and vitals reviewed.  Patient appears to be in no distress. When over physical exam which is really unremarkable.       Assessment & Plan:  9:12 AM Will  order amoxicillin for sinus-related headache and diflucan.  Patient acknowledges and agrees with plan.   This chart was scribed in my presence and reviewed by me personally.  Because of a history of allergies and with this headache in conjunction with past positive x-rays for sinus infection, I'm presuming the patient has a deep-seated sinus infection.    ICD-9-CM ICD-10-CM   1. Sinus headache 784.0 R51 amoxicillin (AMOXIL) 875 MG tablet  2. Antibiotic-induced yeast infection 112.9 B37.9 fluconazole (DIFLUCAN) 150 MG tablet     Signed, [name], MD

## 2014-09-20 NOTE — Patient Instructions (Signed)

## 2014-09-22 DIAGNOSIS — M62830 Muscle spasm of back: Secondary | ICD-10-CM | POA: Diagnosis not present

## 2014-09-24 DIAGNOSIS — M62830 Muscle spasm of back: Secondary | ICD-10-CM | POA: Diagnosis not present

## 2014-09-28 DIAGNOSIS — M62830 Muscle spasm of back: Secondary | ICD-10-CM | POA: Diagnosis not present

## 2014-09-29 ENCOUNTER — Telehealth: Payer: Self-pay

## 2014-09-29 NOTE — Telephone Encounter (Signed)
Spoke with patient and would rather speak with Dr. Joseph Art in regards to sinuses as he is aware of her delicate situation. States that QNasal prescribed by allergy specialist is not covered by her insurance. Suggested flonase or nasacort and declines at this time. Sx included difficulty breathing through nose and considerable congestion, but denies fever or rhinorrhea at this time. Has finished antibiotics.

## 2014-09-29 NOTE — Telephone Encounter (Signed)
Please advise 

## 2014-09-29 NOTE — Telephone Encounter (Signed)
Pt states she had finished all her meds,but nasal passages are still swollen and sinus has not comletely cleared, Please advise   Best phone for pt is [place] [facility]

## 2014-09-30 ENCOUNTER — Other Ambulatory Visit: Payer: Self-pay | Admitting: Family Medicine

## 2014-09-30 DIAGNOSIS — Z9109 Other allergy status, other than to drugs and biological substances: Secondary | ICD-10-CM

## 2014-09-30 MED ORDER — FLUTICASONE PROPIONATE 50 MCG/ACT NA SUSP: 2.0000 | Freq: Every day | NASAL | Status: DC

## 2014-10-05 DIAGNOSIS — M62830 Muscle spasm of back: Secondary | ICD-10-CM | POA: Diagnosis not present

## 2014-10-07 DIAGNOSIS — M62830 Muscle spasm of back: Secondary | ICD-10-CM | POA: Diagnosis not present

## 2014-10-13 DIAGNOSIS — M62830 Muscle spasm of back: Secondary | ICD-10-CM | POA: Diagnosis not present

## 2014-10-21 DIAGNOSIS — M62830 Muscle spasm of back: Secondary | ICD-10-CM | POA: Diagnosis not present

## 2014-10-26 DIAGNOSIS — F4310 Post-traumatic stress disorder, unspecified: Secondary | ICD-10-CM | POA: Diagnosis not present

## 2014-10-26 DIAGNOSIS — F329 Major depressive disorder, single episode, unspecified: Secondary | ICD-10-CM | POA: Diagnosis not present

## 2014-10-26 DIAGNOSIS — M545 Low back pain: Secondary | ICD-10-CM | POA: Diagnosis not present

## 2014-10-26 DIAGNOSIS — M461 Sacroiliitis, not elsewhere classified: Secondary | ICD-10-CM | POA: Diagnosis not present

## 2014-10-26 DIAGNOSIS — M79601 Pain in right arm: Secondary | ICD-10-CM | POA: Diagnosis not present

## 2014-10-26 DIAGNOSIS — M79605 Pain in left leg: Secondary | ICD-10-CM | POA: Diagnosis not present

## 2014-10-26 DIAGNOSIS — K219 Gastro-esophageal reflux disease without esophagitis: Secondary | ICD-10-CM | POA: Diagnosis not present

## 2014-10-26 DIAGNOSIS — G4733 Obstructive sleep apnea (adult) (pediatric): Secondary | ICD-10-CM | POA: Diagnosis not present

## 2014-10-26 DIAGNOSIS — E669 Obesity, unspecified: Secondary | ICD-10-CM | POA: Diagnosis not present

## 2014-10-26 DIAGNOSIS — Z79891 Long term (current) use of opiate analgesic: Secondary | ICD-10-CM | POA: Diagnosis not present

## 2014-10-28 DIAGNOSIS — M62830 Muscle spasm of back: Secondary | ICD-10-CM | POA: Diagnosis not present

## 2014-10-29 ENCOUNTER — Ambulatory Visit (INDEPENDENT_AMBULATORY_CARE_PROVIDER_SITE_OTHER): Payer: Medicare Other | Admitting: Family Medicine

## 2014-10-29 ENCOUNTER — Encounter: Payer: Self-pay | Admitting: Family Medicine

## 2014-10-29 VITALS — BP 112/74 | HR 72 | Temp 98.1°F | Resp 16 | Ht 65.0 in | Wt 296.6 lb

## 2014-10-29 DIAGNOSIS — Z0000 Encounter for general adult medical examination without abnormal findings: Secondary | ICD-10-CM

## 2014-10-29 DIAGNOSIS — Z23 Encounter for immunization: Secondary | ICD-10-CM

## 2014-10-29 DIAGNOSIS — H65191 Other acute nonsuppurative otitis media, right ear: Secondary | ICD-10-CM | POA: Diagnosis not present

## 2014-10-29 DIAGNOSIS — J0101 Acute recurrent maxillary sinusitis: Secondary | ICD-10-CM

## 2014-10-29 MED ORDER — AMOXICILLIN 875 MG PO TABS: 875.0000 mg | ORAL_TABLET | Freq: Two times a day (BID) | ORAL | Status: DC

## 2014-10-29 NOTE — Progress Notes (Addendum)
Subjective:  This chart was scribed for Valerie Haber, MD by Leandra Kern, Medical Scribe. This patient was seen in Room  and the patient's care was started at 12:13 PM.   Patient ID: Valerie [name], female    DOB: 1961-02-14, 54 y.o.   MRN: [id number]  HPI HPI Comments: Valerie [name] is a 54 y.o. female with a PMHX of Obesity, HTN, Fibromyalgia, and back pain,; who presents to [facility] for her 2- month follow up.  Patient reports having headaches, sinus, sharp right ear pain, feeling fatigued, mild cough in the morning, and myalgia on exertion. She states that sinus symptoms started last week. Her ear pain started 3 days ago. Patient states that she stopped taking Flonase few days ago because she believes that she has infected hair follicles in her nose.  She has headaches on the right side of the head, otherwise she denies double vision. Patient still presents with tenderness on the left side of the thighs, worst while putting pressure on them.She indicates that only pain medications can give her relief. Patient is still compliant with her morphine, taking it in the morning, and sometimes in the evenings as needed. She reports still being on hydrocodone.  Patient reports doing more physical exertion recently. She gives the example of climbing the stairs yesterday, when she was presented with unusual weakness. She also reports using her inhaler more frequently, not as a result of SOB, rather just chest tightness. She indicates that she always wears CPAP, and she sleeps well at nights.  Patient reports having a living will and POA. She is UTD with her immunizations, however, she needs a PAP test and tetanus shot to be preformed. She had Colonoscopy procedure done three years ago   Immunization History  Administered Date(s) Administered   Influenza Split 03/19/2014   Influenza-Unspecified 03/12/2013, 03/20/2014   Pneumococcal Conjugate-13 03/20/2014    Pneumococcal Polysaccharide-23 03/19/2014     Review of Systems  Constitutional: Positive for fatigue.  HENT: Positive for congestion, ear pain (sharp) and sinus pressure.   Eyes: Negative for visual disturbance.  Respiratory: Positive for cough.   Musculoskeletal: Positive for myalgias.       Pain in the left side thigh  Neurological: Positive for weakness and headaches.       Objective:   Physical Exam  Constitutional: She is oriented to person, place, and time. She appears well-developed and well-nourished. No distress.  HENT:  Head: Normocephalic and atraumatic.  Mouth/Throat: Oropharynx is clear and moist. No oropharyngeal exudate.  Cloudy fluid behind right eardrum.  Narrowed nasal passage  Eyes: Pupils are equal, round, and reactive to light.  Neck: Neck supple.  Cardiovascular: Normal rate.   Pulmonary/Chest: Effort normal.  Musculoskeletal: She exhibits no edema.  Pelvic exam was normal.  Neurological: She is alert and oriented to person, place, and time. No cranial nerve deficit.  Skin: Skin is warm and dry. No rash noted.  Psychiatric: She has a normal mood and affect. Her behavior is normal.  Nursing note and vitals reviewed.  PAP exam was taken with normal pelvic exam done.   spent 45 minutes with patient Assessment & Plan:   This chart was scribed in my presence and reviewed by me personally.    ICD-9-CM ICD-10-CM   1. Acute recurrent maxillary sinusitis 461.0 J01.01 amoxicillin (AMOXIL) 875 MG tablet  2. Welcome to Medicare preventive visit V70.0 Z00.00   3. Acute nonsuppurative otitis media of right ear 381.00  H65.191 amoxicillin (AMOXIL) 875 MG tablet  4. Need for Tdap vaccination V06.1 Z23 Tdap vaccine greater than or equal to 7yo IM   Reviewed advance directives Signed, Valerie Haber, MD

## 2014-10-29 NOTE — Patient Instructions (Signed)
Sinusitis Sinusitis is redness, soreness, and inflammation of the paranasal sinuses. Paranasal sinuses are air pockets within the bones of your face (beneath the eyes, the middle of the forehead, or above the eyes). In healthy paranasal sinuses, mucus is able to drain out, and air is able to circulate through them by way of your nose. However, when your paranasal sinuses are inflamed, mucus and air can become trapped. This can allow bacteria and other germs to grow and cause infection. Sinusitis can develop quickly and last only a short time (acute) or continue over a long period (chronic). Sinusitis that lasts for more than 12 weeks is considered chronic.  CAUSES  Causes of sinusitis include:  Allergies.  Structural abnormalities, such as displacement of the cartilage that separates your nostrils (deviated septum), which can decrease the air flow through your nose and sinuses and affect sinus drainage.  Functional abnormalities, such as when the small hairs (cilia) that line your sinuses and help remove mucus do not work properly or are not present. SIGNS AND SYMPTOMS  Symptoms of acute and chronic sinusitis are the same. The primary symptoms are pain and pressure around the affected sinuses. Other symptoms include:  Upper toothache.  Earache.  Headache.  Bad breath.  Decreased sense of smell and taste.  A cough, which worsens when you are lying flat.  Fatigue.  Fever.  Thick drainage from your nose, which often is green and may contain pus (purulent).  Swelling and warmth over the affected sinuses. DIAGNOSIS  Your health care provider will perform a physical exam. During the exam, your health care provider may:  Look in your nose for signs of abnormal growths in your nostrils (nasal polyps).  Tap over the affected sinus to check for signs of infection.  View the inside of your sinuses (endoscopy) using an imaging device that has a light attached (endoscope). If your health  care provider suspects that you have chronic sinusitis, one or more of the following tests may be recommended:  Allergy tests.  Nasal culture. A sample of mucus is taken from your nose, sent to a lab, and screened for bacteria.  Nasal cytology. A sample of mucus is taken from your nose and examined by your health care provider to determine if your sinusitis is related to an allergy. TREATMENT  Most cases of acute sinusitis are related to a viral infection and will resolve on their own within 10 days. Sometimes medicines are prescribed to help relieve symptoms (pain medicine, decongestants, nasal steroid sprays, or saline sprays).  However, for sinusitis related to a bacterial infection, your health care provider will prescribe antibiotic medicines. These are medicines that will help kill the bacteria causing the infection.  Rarely, sinusitis is caused by a fungal infection. In theses cases, your health care provider will prescribe antifungal medicine. For some cases of chronic sinusitis, surgery is needed. Generally, these are cases in which sinusitis recurs more than 3 times per year, despite other treatments. HOME CARE INSTRUCTIONS   Drink plenty of water. Water helps thin the mucus so your sinuses can drain more easily.  Use a humidifier.  Inhale steam 3 to 4 times a day (for example, sit in the bathroom with the shower running).  Apply a warm, moist washcloth to your face 3 to 4 times a day, or as directed by your health care provider.  Use saline nasal sprays to help moisten and clean your sinuses.  Take medicines only as directed by your health care provider.    If you were prescribed either an antibiotic or antifungal medicine, finish it all even if you start to feel better. SEEK IMMEDIATE MEDICAL CARE IF:  You have increasing pain or severe headaches.  You have nausea, vomiting, or drowsiness.  You have swelling around your face.  You have vision problems.  You have a stiff  neck.  You have difficulty breathing. MAKE SURE YOU:   Understand these instructions.  Will watch your condition.  Will get help right away if you are not doing well or get worse. Document Released: 05/29/2005 Document Revised: 10/13/2013 Document Reviewed: 06/13/2011 [facility] Patient Information 2015 [place], [place]. This information is not intended to replace advice given to you by your health care provider. Make sure you discuss any questions you have with your health care provider. Health Maintenance Adopting a healthy lifestyle and getting preventive care can go a long way to promote health and wellness. Talk with your health care provider about what schedule of regular examinations is right for you. This is a good chance for you to check in with your provider about disease prevention and staying healthy. In between checkups, there are plenty of things you can do on your own. Experts have done a lot of research about which lifestyle changes and preventive measures are most likely to keep you healthy. Ask your health care provider for more information. WEIGHT AND DIET  Eat a healthy diet  Be sure to include plenty of vegetables, fruits, low-fat dairy products, and lean protein.  Do not eat a lot of foods high in solid fats, added sugars, or salt.  Get regular exercise. This is one of the most important things you can do for your health.  Most adults should exercise for at least 150 minutes each week. The exercise should increase your heart rate and make you sweat (moderate-intensity exercise).  Most adults should also do strengthening exercises at least twice a week. This is in addition to the moderate-intensity exercise.  Maintain a healthy weight  Body mass index (BMI) is a measurement that can be used to identify possible weight problems. It estimates body fat based on height and weight. Your health care provider can help determine your BMI and help you achieve or maintain a  healthy weight.  For females 55 years of age and older:   A BMI below 18.5 is considered underweight.  A BMI of 18.5 to 24.9 is normal.  A BMI of 25 to 29.9 is considered overweight.  A BMI of 30 and above is considered obese.  Watch levels of cholesterol and blood lipids  You should start having your blood tested for lipids and cholesterol at 53 years of age, then have this test every 5 years.  You may need to have your cholesterol levels checked more often if:  Your lipid or cholesterol levels are high.  You are older than 53 years of age.  You are at high risk for heart disease.  CANCER SCREENING   Lung Cancer  Lung cancer screening is recommended for adults 64-62 years old who are at high risk for lung cancer because of a history of smoking.  A yearly low-dose CT scan of the lungs is recommended for people who:  Currently smoke.  Have quit within the past 15 years.  Have at least a 30-pack-year history of smoking. A pack year is smoking an average of one pack of cigarettes a day for 1 year.  Yearly screening should continue until it has been 15 years since  you quit.  Yearly screening should stop if you develop a health problem that would prevent you from having lung cancer treatment.  Breast Cancer  Practice breast self-awareness. This means understanding how your breasts normally appear and feel.  It also means doing regular breast self-exams. Let your health care provider know about any changes, no matter how small.  If you are in your 20s or 30s, you should have a clinical breast exam (CBE) by a health care provider every 1-3 years as part of a regular health exam.  If you are 42 or older, have a CBE every year. Also consider having a breast X-ray (mammogram) every year.  If you have a family history of breast cancer, talk to your health care provider about genetic screening.  If you are at high risk for breast cancer, talk to your health care provider  about having an MRI and a mammogram every year.  Breast cancer gene (BRCA) assessment is recommended for women who have family members with BRCA-related cancers. BRCA-related cancers include:  Breast.  Ovarian.  Tubal.  Peritoneal cancers.  Results of the assessment will determine the need for genetic counseling and BRCA1 and BRCA2 testing. Cervical Cancer Routine pelvic examinations to screen for cervical cancer are no longer recommended for nonpregnant women who are considered low risk for cancer of the pelvic organs (ovaries, uterus, and vagina) and who do not have symptoms. A pelvic examination may be necessary if you have symptoms including those associated with pelvic infections. Ask your health care provider if a screening pelvic exam is right for you.   The Pap test is the screening test for cervical cancer for women who are considered at risk.  If you had a hysterectomy for a problem that was not cancer or a condition that could lead to cancer, then you no longer need Pap tests.  If you are older than 65 years, and you have had normal Pap tests for the past 10 years, you no longer need to have Pap tests.  If you have had past treatment for cervical cancer or a condition that could lead to cancer, you need Pap tests and screening for cancer for at least 20 years after your treatment.  If you no longer get a Pap test, assess your risk factors if they change (such as having a new sexual partner). This can affect whether you should start being screened again.  Some women have medical problems that increase their chance of getting cervical cancer. If this is the case for you, your health care provider may recommend more frequent screening and Pap tests.  The human papillomavirus (HPV) test is another test that may be used for cervical cancer screening. The HPV test looks for the virus that can cause cell changes in the cervix. The cells collected during the Pap test can be tested for  HPV.  The HPV test can be used to screen women 33 years of age and older. Getting tested for HPV can extend the interval between normal Pap tests from three to five years.  An HPV test also should be used to screen women of any age who have unclear Pap test results.  After 53 years of age, women should have HPV testing as often as Pap tests.  Colorectal Cancer  This type of cancer can be detected and often prevented.  Routine colorectal cancer screening usually begins at 53 years of age and continues through 53 years of age.  Your health care provider may  recommend screening at an earlier age if you have risk factors for colon cancer.  Your health care provider may also recommend using home test kits to check for hidden blood in the stool.  A small camera at the end of a tube can be used to examine your colon directly (sigmoidoscopy or colonoscopy). This is done to check for the earliest forms of colorectal cancer.  Routine screening usually begins at age 67.  Direct examination of the colon should be repeated every 5-10 years through 53 years of age. However, you may need to be screened more often if early forms of precancerous polyps or small growths are found. Skin Cancer  Check your skin from head to toe regularly.  Tell your health care provider about any new moles or changes in moles, especially if there is a change in a mole's shape or color.  Also tell your health care provider if you have a mole that is larger than the size of a pencil eraser.  Always use sunscreen. Apply sunscreen liberally and repeatedly throughout the day.  Protect yourself by wearing long sleeves, pants, a wide-brimmed hat, and sunglasses whenever you are outside. HEART DISEASE, DIABETES, AND HIGH BLOOD PRESSURE   Have your blood pressure checked at least every 1-2 years. High blood pressure causes heart disease and increases the risk of stroke.  If you are between 74 years and 26 years old, ask  your health care provider if you should take aspirin to prevent strokes.  Have regular diabetes screenings. This involves taking a blood sample to check your fasting blood sugar level.  If you are at a normal weight and have a low risk for diabetes, have this test once every three years after 53 years of age.  If you are overweight and have a high risk for diabetes, consider being tested at a younger age or more often. PREVENTING INFECTION  Hepatitis B  If you have a higher risk for hepatitis B, you should be screened for this virus. You are considered at high risk for hepatitis B if:  You were born in a country where hepatitis B is common. Ask your health care provider which countries are considered high risk.  Your parents were born in a high-risk country, and you have not been immunized against hepatitis B (hepatitis B vaccine).  You have HIV or AIDS.  You use needles to inject street drugs.  You live with someone who has hepatitis B.  You have had sex with someone who has hepatitis B.  You get hemodialysis treatment.  You take certain medicines for conditions, including cancer, organ transplantation, and autoimmune conditions. Hepatitis C  Blood testing is recommended for:  Everyone born from [birth year] through 1964.  Anyone with known risk factors for hepatitis C. Sexually transmitted infections (STIs)  You should be screened for sexually transmitted infections (STIs) including gonorrhea and chlamydia if:  You are sexually active and are younger than 53 years of age.  You are older than 53 years of age and your health care provider tells you that you are at risk for this type of infection.  Your sexual activity has changed since you were last screened and you are at an increased risk for chlamydia or gonorrhea. Ask your health care provider if you are at risk.  If you do not have HIV, but are at risk, it may be recommended that you take a prescription medicine daily to  prevent HIV infection. This is called pre-exposure prophylaxis (PrEP). You  are considered at risk if:  You are sexually active and do not regularly use condoms or know the HIV status of your partner(s).  You take drugs by injection.  You are sexually active with a partner who has HIV. Talk with your health care provider about whether you are at high risk of being infected with HIV. If you choose to begin PrEP, you should first be tested for HIV. You should then be tested every 3 months for as long as you are taking PrEP.  PREGNANCY   If you are premenopausal and you may become pregnant, ask your health care provider about preconception counseling.  If you may become pregnant, take 400 to 800 micrograms (mcg) of folic acid every day.  If you want to prevent pregnancy, talk to your health care provider about birth control (contraception). OSTEOPOROSIS AND MENOPAUSE   Osteoporosis is a disease in which the bones lose minerals and strength with aging. This can result in serious bone fractures. Your risk for osteoporosis can be identified using a bone density scan.  If you are 89 years of age or older, or if you are at risk for osteoporosis and fractures, ask your health care provider if you should be screened.  Ask your health care provider whether you should take a calcium or vitamin D supplement to lower your risk for osteoporosis.  Menopause may have certain physical symptoms and risks.  Hormone replacement therapy may reduce some of these symptoms and risks. Talk to your health care provider about whether hormone replacement therapy is right for you.  HOME CARE INSTRUCTIONS   Schedule regular health, dental, and eye exams.  Stay current with your immunizations.   Do not use any tobacco products including cigarettes, chewing tobacco, or electronic cigarettes.  If you are pregnant, do not drink alcohol.  If you are breastfeeding, limit how much and how often you drink  alcohol.  Limit alcohol intake to no more than 1 drink per day for nonpregnant women. One drink equals 12 ounces of beer, 5 ounces of wine, or 1 ounces of hard liquor.  Do not use street drugs.  Do not share needles.  Ask your health care provider for help if you need support or information about quitting drugs.  Tell your health care provider if you often feel depressed.  Tell your health care provider if you have ever been abused or do not feel safe at home. Document Released: 12/12/2010 Document Revised: 10/13/2013 Document Reviewed: 04/30/2013 [facility] Patient Information 2015 [place], [place]. This information is not intended to replace advice given to you by your health care provider. Make sure you discuss any questions you have with your health care provider.

## 2014-11-01 ENCOUNTER — Telehealth: Payer: Self-pay

## 2014-11-01 DIAGNOSIS — J029 Acute pharyngitis, unspecified: Secondary | ICD-10-CM

## 2014-11-01 DIAGNOSIS — J3489 Other specified disorders of nose and nasal sinuses: Secondary | ICD-10-CM

## 2014-11-01 NOTE — Telephone Encounter (Signed)
Pt wants to let Dr. Carlean Jews know that she is still not feeling better since visit last Thursday despite taking RX. She wants to know what he recommends. She is also requesting a refill of metoprolol succinate (TOPROL-XL) 100 MG 24 hr tablet [[id number]] .

## 2014-11-02 ENCOUNTER — Telehealth: Payer: Self-pay | Admitting: Family Medicine

## 2014-11-02 ENCOUNTER — Other Ambulatory Visit: Payer: Self-pay | Admitting: Family Medicine

## 2014-11-02 LAB — PAP IG (IMAGE GUIDED)

## 2014-11-02 MED ORDER — METOPROLOL SUCCINATE ER 100 MG PO TB24: 100.0000 mg | ORAL_TABLET | Freq: Every day | ORAL | Status: DC

## 2014-11-02 NOTE — Telephone Encounter (Signed)
Spoke with patient gave her her results

## 2014-11-02 NOTE — Telephone Encounter (Signed)
Spoke with pt--she is still having the fullness in her ear when she lays down. Pain above and below her right eye. She is not better. Please advise. She states it not really any worse, just not any better either. She also wants Metoprolol. See previous message.

## 2014-11-03 DIAGNOSIS — M62830 Muscle spasm of back: Secondary | ICD-10-CM | POA: Diagnosis not present

## 2014-11-04 ENCOUNTER — Other Ambulatory Visit: Payer: Self-pay | Admitting: Family Medicine

## 2014-11-04 NOTE — Telephone Encounter (Signed)
Pt also wanted [place] to know that she is now experiencing a sore throat

## 2014-11-07 MED ORDER — CLINDAMYCIN HCL 150 MG PO CAPS: 150.0000 mg | ORAL_CAPSULE | Freq: Three times a day (TID) | ORAL | Status: DC

## 2014-11-07 NOTE — Telephone Encounter (Signed)
Sore throat and sinus pain are continuing.  Patient will take probiotics and call back for gi sx or continued discomfort

## 2014-11-12 ENCOUNTER — Other Ambulatory Visit: Payer: Self-pay | Admitting: Family Medicine

## 2014-11-12 DIAGNOSIS — J3501 Chronic tonsillitis: Secondary | ICD-10-CM

## 2014-11-13 ENCOUNTER — Encounter: Payer: Self-pay | Admitting: *Deleted

## 2014-11-17 ENCOUNTER — Other Ambulatory Visit: Payer: Self-pay | Admitting: Family Medicine

## 2014-11-18 ENCOUNTER — Other Ambulatory Visit: Payer: Self-pay | Admitting: Family Medicine

## 2014-11-20 DIAGNOSIS — M2662 Arthralgia of temporomandibular joint: Secondary | ICD-10-CM | POA: Diagnosis not present

## 2014-11-20 DIAGNOSIS — J301 Allergic rhinitis due to pollen: Secondary | ICD-10-CM | POA: Diagnosis not present

## 2014-11-20 DIAGNOSIS — J329 Chronic sinusitis, unspecified: Secondary | ICD-10-CM | POA: Diagnosis not present

## 2014-11-20 DIAGNOSIS — J387 Other diseases of larynx: Secondary | ICD-10-CM | POA: Diagnosis not present

## 2014-12-02 ENCOUNTER — Other Ambulatory Visit: Payer: Self-pay | Admitting: Family Medicine

## 2014-12-02 ENCOUNTER — Other Ambulatory Visit: Payer: Self-pay

## 2014-12-02 DIAGNOSIS — J309 Allergic rhinitis, unspecified: Secondary | ICD-10-CM

## 2014-12-02 MED ORDER — MODAFINIL 200 MG PO TABS: 100.0000 mg | ORAL_TABLET | Freq: Two times a day (BID) | ORAL | Status: DC

## 2014-12-02 NOTE — Telephone Encounter (Signed)
Patient requests 90 days Rx

## 2014-12-03 MED ORDER — MONTELUKAST SODIUM 10 MG PO TABS: ORAL_TABLET | ORAL | Status: DC

## 2014-12-03 NOTE — Telephone Encounter (Signed)
Is pt still taking this medication?

## 2014-12-03 NOTE — Telephone Encounter (Signed)
Spoke with pt on phone. She states she does not need spirolactone refilled but does need singulair refilled. Rx sent to pharmacy.

## 2014-12-03 NOTE — Telephone Encounter (Signed)
Rx has been signed and faxed  

## 2014-12-21 ENCOUNTER — Other Ambulatory Visit: Payer: Self-pay | Admitting: Family Medicine

## 2014-12-27 ENCOUNTER — Ambulatory Visit (INDEPENDENT_AMBULATORY_CARE_PROVIDER_SITE_OTHER): Payer: Medicare Other | Admitting: Family Medicine

## 2014-12-27 VITALS — BP 112/80 | HR 78 | Temp 98.4°F | Resp 18 | Ht 64.0 in | Wt 292.4 lb

## 2014-12-27 DIAGNOSIS — R1110 Vomiting, unspecified: Secondary | ICD-10-CM

## 2014-12-27 LAB — POCT CBC
Granulocyte percent: 68.8 %G (ref 37–80)
HCT, POC: 36.8 % — AB (ref 37.7–47.9)
Hemoglobin: 12.4 g/dL (ref 12.2–16.2)
Lymph, poc: 2 (ref 0.6–3.4)
MCH, POC: 30.3 pg (ref 27–31.2)
MCHC: 33.6 g/dL (ref 31.8–35.4)
MCV: 90.3 fL (ref 80–97)
MID (cbc): 0.3 (ref 0–0.9)
MPV: 7.5 fL (ref 0–99.8)
POC Granulocyte: 5.1 (ref 2–6.9)
POC LYMPH PERCENT: 27.6 %L (ref 10–50)
POC MID %: 3.6 %M (ref 0–12)
Platelet Count, POC: 299 10*3/uL (ref 142–424)
RBC: 4.08 M/uL (ref 4.04–5.48)
RDW, POC: 13.8 %
WBC: 7.4 10*3/uL (ref 4.6–10.2)

## 2014-12-27 LAB — COMPLETE METABOLIC PANEL WITH GFR
ALT: 14 U/L (ref 0–35)
AST: 14 U/L (ref 0–37)
Albumin: 4.3 g/dL (ref 3.5–5.2)
Alkaline Phosphatase: 41 U/L (ref 39–117)
BUN: 13 mg/dL (ref 6–23)
CO2: 25 mEq/L (ref 19–32)
Calcium: 9.8 mg/dL (ref 8.4–10.5)
Chloride: 106 mEq/L (ref 96–112)
Creat: 1.14 mg/dL — ABNORMAL HIGH (ref 0.50–1.10)
GFR, Est African American: 64 mL/min
GFR, Est Non African American: 55 mL/min — ABNORMAL LOW
Glucose, Bld: 107 mg/dL — ABNORMAL HIGH (ref 70–99)
Potassium: 5 mEq/L (ref 3.5–5.3)
Sodium: 141 mEq/L (ref 135–145)
Total Bilirubin: 0.4 mg/dL (ref 0.2–1.2)
Total Protein: 7.2 g/dL (ref 6.0–8.3)

## 2014-12-27 LAB — POCT URINALYSIS DIPSTICK
Bilirubin, UA: NEGATIVE
Blood, UA: NEGATIVE
Glucose, UA: NEGATIVE
Ketones, UA: NEGATIVE
Leukocytes, UA: NEGATIVE
Nitrite, UA: NEGATIVE
Protein, UA: NEGATIVE
Spec Grav, UA: 1.01
Urobilinogen, UA: 0.2
pH, UA: 6

## 2014-12-27 LAB — POCT UA - MICROSCOPIC ONLY
Bacteria, U Microscopic: NEGATIVE
Casts, Ur, LPF, POC: NEGATIVE
Crystals, Ur, HPF, POC: NEGATIVE
Mucus, UA: NEGATIVE
Yeast, UA: NEGATIVE

## 2014-12-27 LAB — LIPASE: Lipase: 69 U/L (ref 0–75)

## 2014-12-27 LAB — AMYLASE: Amylase: 44 U/L (ref 0–105)

## 2014-12-27 MED ORDER — SUCRALFATE 1 GM/10ML PO SUSP: 1.0000 g | Freq: Three times a day (TID) | ORAL | Status: DC

## 2014-12-27 MED ORDER — PROMETHAZINE HCL 25 MG PO TABS: 25.0000 mg | ORAL_TABLET | Freq: Four times a day (QID) | ORAL | Status: DC | PRN

## 2014-12-27 NOTE — Addendum Note (Signed)
Addended by: [name] on: 12/27/2014 09:57 AM   Modules accepted: Orders

## 2014-12-27 NOTE — Patient Instructions (Signed)
We are running test to see if this could be pancreatitis. In the meantime I want you to start Carafate. This is a 4 times a day medicine That will line your esophagus and stomach and stop any bleeding. I'll call you in the next day or so when I get the pancreas tests back. You may stop the omeprazole while taking the Carafate.

## 2014-12-27 NOTE — Progress Notes (Signed)
Patient ID: Valerie [name], female   DOB: [birth year], 54 y.o.   MRN: [id number]   This chart was scribed for [name], MD by [facility], medical scribe at [facility].The patient was seen in exam room 13 and the patient's care was started at 8:57 AM.  Patient ID: Valerie [name] MRN: [id number], DOB: Nov 10, 1960, 54 y.o. Date of Encounter: 12/27/2014  Primary Physician: [name], MD  Chief Complaint:  Chief Complaint  Patient presents with   Nausea   Vomiting    Threw up black liquids on Tuesday   Dysphagia   Chest Pain   Back Pain   HPI:  Valerie [name] is a 54 y.o. female who presents to [facility] complaining of vomiting which began five days ago. Pt was vomiting, what she describes as a  black liquid until the next day. Since Wednesday she has had trouble swallowing which eased off yesterday but returned today. Today, she has been dry heaving with nausea and trouble swallowing. She feels as though there is a lump in her throat and she noticed a clicking sound. Trying to keep fluids down. Taking probiotics. Thursday she had diarrhea and Friday she developed a lower back pain. She also had mild abdominal pain. Pt is exhausted today. She works for her church doing Psychologist, counselling.  Past Medical History  Diagnosis Date   Hypertension    Anxiety    Mental disorder    Depression    Sleep apnea    Headache(784.0)    Neuromuscular disorder    Nerve pain     nerve damage to right arm   Fibromyalgia    Arthritis     degenerative   Anemia 05/01/2012   Gastro-esophageal reflux    Sleep apnea     CPAP-9   Pancreatitis    Diarrhea     EPISODIC   Back pain     lower    Home Meds: Prior to Admission medications   Medication Sig Start Date End Date Taking? Authorizing Provider  albuterol (PROVENTIL HFA;VENTOLIN HFA) 108 (90 BASE) MCG/ACT inhaler Inhale 2 puffs into the lungs as needed for  wheezing or shortness of breath. 02/03/14  Yes [name], MD  allopurinol (ZYLOPRIM) 300 MG tablet Take 150 mg by mouth daily.   Yes Historical Provider, MD  ALPRAZolam Duanne Moron) 1 MG tablet Take 1 tablet (1 mg total) by mouth 3 (three) times daily as needed for anxiety. 08/27/14  Yes [name], MD  baclofen (LIORESAL) 10 MG tablet TAKE 1 TABLET (10 MG TOTAL) BY MOUTH 3 (THREE TIMES DAILY) 12/23/14  Yes [name], PA-C  benazepril (LOTENSIN) 40 MG tablet Take 40 mg by mouth daily.   Yes Historical Provider, MD  Biotin 5000 MCG CAPS Take 10,000 mcg by mouth daily.    Yes Historical Provider, MD  butalbital-acetaminophen-caffeine (FIORICET, ESGIC) 50-325-40 MG per tablet Take 1-2 tablets by mouth 2 (two) times daily as needed for headache or migraine.   Yes Historical Provider, MD  cetirizine (ZYRTEC) 10 MG tablet Take 10 mg by mouth daily.   Yes Historical Provider, MD  Cholecalciferol (VITAMIN D) 2000 UNITS tablet Take 2,000 Units by mouth daily.   Yes Historical Provider, MD  fluticasone (FLONASE) 50 MCG/ACT nasal spray Place 2 sprays into both nostrils daily. 09/30/14  Yes [name], MD  furosemide (LASIX) 20 MG tablet TAKE 1 TABLET BY MOUTH DAILY AS NEEDED 11/19/14  Yes [name], MD  HYDROcodone-acetaminophen (  NORCO) 10-325 MG per tablet Take 1-2 tablets by mouth every 6 (six) hours as needed (pain.).   Yes Historical Provider, MD  ibuprofen (ADVIL,MOTRIN) 600 MG tablet Take 600 mg by mouth every 6 (six) hours as needed for mild pain.    Yes Historical Provider, MD  metoprolol succinate (TOPROL-XL) 100 MG 24 hr tablet Take 1 tablet (100 mg total) by mouth daily. Take with or immediately following a meal. 11/02/14  Yes [name], MD  modafinil (PROVIGIL) 200 MG tablet Take 0.5 tablets (100 mg total) by mouth 2 (two) times daily. 12/02/14  Yes Carmen Dohmeier, MD  montelukast (SINGULAIR) 10 MG tablet TAKE 1 TABLET (10 MG TOTAL) BY MOUTH AT BEDTIME 12/03/14  Yes Bennett Scrape V,  PA-C  morphine (MS CONTIN) 30 MG 12 hr tablet Take 30 mg by mouth every 8 (eight) hours. Not from [facility] 09/23/12  Yes Historical Provider, MD  Multiple Vitamins-Minerals (MULTIVITAMIN PO) Take 1 tablet by mouth daily.   Yes Historical Provider, MD  omeprazole (PRILOSEC) 40 MG capsule Take 40 mg by mouth 2 (two) times daily.   Yes Historical Provider, MD  Pancrelipase, Lip-Prot-Amyl, (CREON) 24000 UNITS CPEP Take 48,000 Units by mouth 4 (four) times daily as needed (for pancreatitis).   Yes Historical Provider, MD  PATADAY 0.2 % SOLN APPLY 1 DROP TO EYE 2 (TWO) TIMES DAILY. 06/11/14  Yes [name], MD  promethazine (PHENERGAN) 25 MG tablet Take 25 mg by mouth every 6 (six) hours as needed for nausea or vomiting.   Yes Historical Provider, MD  spironolactone (ALDACTONE) 100 MG tablet Take 100 mg by mouth daily.   Yes Historical Provider, MD  methocarbamol (ROBAXIN) 500 MG tablet Take 1 tablet (500 mg total) by mouth 4 (four) times daily. Patient not taking: Reported on 10/29/2014 06/25/14   [name], MD   Allergies:  Allergies  Allergen Reactions   Dilaudid [Hydromorphone Hcl] Other (See Comments)    Headache    Pregabalin    Metoclopramide Hcl Other (See Comments)    "crawling inside"   Neurontin [Gabapentin] Other (See Comments)    Tremors    Other Other (See Comments)    Avoid all SSRI's and SNRI's- damaged pancreas   Reglan [Metoclopramide]    History   Social History   Marital Status: Single    Spouse Name: N/A   Number of Children: 0   Years of Education: Copywriter, advertising History   disabled    Social History Main Topics   Smoking status: Never Smoker    Smokeless tobacco: Never Used   Alcohol Use: No   Drug Use: No   Sexual Activity: Yes   Other Topics Concern   Not on file   Social History Narrative   Caffeine 2 cups iced tea daily avg.    Review of Systems: Constitutional: negative for chills, fever, night sweats, weight changes,  positive for fatigue. HEENT: negative for vision changes, hearing loss, congestion, rhinorrhea, ST, epistaxis, or sinus pressure Cardiovascular: negative for chest pain or palpitations Respiratory: negative for hemoptysis, wheezing, shortness of breath, or cough Abdominal: negative for constipation. Positive for abdominal pain, nausea, vomiting, and diarrhea. Dermatological: negative for rash Msk: positive for lower back pain. Neurologic: negative for headache, dizziness, or syncope All other systems reviewed and are otherwise negative with the exception to those above and in the HPI.  Physical Exam: Blood pressure 112/80, pulse 78, temperature 98.4 F (36.9 C), temperature source Oral, resp. rate 18, height 5\' 4"  (  1.626 m), weight 292 lb 6 oz (132.62 kg), SpO2 98 %., Body mass index is 50.16 kg/(m^2). General: Well developed, well nourished, in no acute distress. Head: Normocephalic, atraumatic, eyes without discharge, sclera non-icteric, nares are without discharge. Bilateral auditory canals clear, TM's are without perforation, pearly grey and translucent with reflective cone of light bilaterally. Oral cavity moist, posterior pharynx without exudate, erythema, peritonsillar abscess, or post nasal drip.  Neck: Supple. No thyromegaly. Full ROM. No lymphadenopathy. Lungs: Clear bilaterally to auscultation without wheezes, rales, or rhonchi. Breathing is unlabored. Heart: RRR with S1 S2. No murmurs, rubs, or gallops appreciated. Abdomen: Soft, non-tender, non-distended with normoactive bowel sounds. No hepatomegaly. No rebound/guarding. No obvious abdominal masses. Msk:  Strength and tone normal for age. Extremities/Skin: Warm and dry. No clubbing or cyanosis. No edema. No rashes or suspicious lesions. Neuro: Alert and oriented X 3. Moves all extremities spontaneously. Gait is normal. CNII-XII grossly in tact. Psych:  Responds to questions appropriately with a normal affect.   Labs: Results  for orders placed or performed in visit on 12/27/14  POCT CBC  Result Value Ref Range   WBC 7.4 4.6 - 10.2 K/uL   Lymph, poc 2.0 0.6 - 3.4   POC LYMPH PERCENT 27.6 10 - 50 %L   MID (cbc) 0.3 0 - 0.9   POC MID % 3.6 0 - 12 %M   POC Granulocyte 5.1 2 - 6.9   Granulocyte percent 68.8 37 - 80 %G   RBC 4.08 4.04 - 5.48 M/uL   Hemoglobin 12.4 12.2 - 16.2 g/dL   HCT, POC 36.8 (A) 37.7 - 47.9 %   MCV 90.3 80 - 97 fL   MCH, POC 30.3 27 - 31.2 pg   MCHC 33.6 31.8 - 35.4 g/dL   RDW, POC 13.8 %   Platelet Count, POC 299 142 - 424 K/uL   MPV 7.5 0 - 99.8 fL  POCT UA - Microscopic Only  Result Value Ref Range   WBC, Ur, HPF, POC 0-1    RBC, urine, microscopic 0-1    Bacteria, U Microscopic negative    Mucus, UA negative    Epithelial cells, urine per micros 1-9    Crystals, Ur, HPF, POC negative    Casts, Ur, LPF, POC negative    Yeast, UA negative   POCT urinalysis dipstick  Result Value Ref Range   Color, UA yellow    Clarity, UA slightly cloudy    Glucose, UA negative    Bilirubin, UA negative    Ketones, UA negative    Spec Grav, UA 1.010    Blood, UA negative    pH, UA 6.0    Protein, UA negative    Urobilinogen, UA 0.2    Nitrite, UA negative    Leukocytes, UA Negative Negative     ASSESSMENT AND PLAN:  54 y.o. year old female with   This chart was scribed in my presence and reviewed by me personally.    ICD-9-CM ICD-10-CM   1. Intractable vomiting with nausea, vomiting of unspecified type 536.2 R11.10 Amylase     Lipase     COMPLETE METABOLIC PANEL WITH GFR     POCT CBC     POCT UA - Microscopic Only     POCT urinalysis dipstick   Signed, [name], MD 12/27/2014 8:57 AM

## 2014-12-30 ENCOUNTER — Telehealth: Payer: Self-pay

## 2014-12-30 NOTE — Telephone Encounter (Signed)
Pt has more questions for dr Joseph Art

## 2014-12-30 NOTE — Telephone Encounter (Signed)
Please have patient call Dr. Amedeo Plenty office to see if he has any suggestions.

## 2014-12-30 NOTE — Telephone Encounter (Signed)
Spoke with Valerie [name], advised message from Dr. Joseph Art. She would like to know when you want her to come back to see you.

## 2014-12-30 NOTE — Telephone Encounter (Signed)
Spoke with pt, she wanted to tell Dr. Joseph Art she is burning more due to stopping the omeprazole. She states the carafate does not work and she is producing more acid. Pt very tearful on the phone. She cannot lay down due to the pressure and she gets really nauseous. Please advise.

## 2014-12-31 ENCOUNTER — Other Ambulatory Visit: Payer: Self-pay | Admitting: Physician Assistant

## 2014-12-31 NOTE — Telephone Encounter (Signed)
Called pt, advised message from Dr. Joseph Art. She states she has an appt with Dr. Amedeo Plenty on Tuesday at 11:45.

## 2014-12-31 NOTE — Telephone Encounter (Signed)
This afternoon would be fine.  Has she tried Dr. Amedeo Plenty office again?

## 2015-01-02 ENCOUNTER — Ambulatory Visit: Payer: Medicare Other

## 2015-01-02 NOTE — Telephone Encounter (Signed)
Patient came in today to be seen and waited over an hour and a half to se Dr. Joseph Art. 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 She was here for a 2 month follow up. Wants to know what Dr. Carlean Jews wants her to do in regard to her coming back. Please respond via mychart only. Where she is staying at she cant take any calls.

## 2015-01-05 DIAGNOSIS — R112 Nausea with vomiting, unspecified: Secondary | ICD-10-CM | POA: Diagnosis not present

## 2015-01-05 DIAGNOSIS — K3184 Gastroparesis: Secondary | ICD-10-CM | POA: Diagnosis not present

## 2015-01-12 ENCOUNTER — Telehealth: Payer: Self-pay | Admitting: Neurology

## 2015-01-12 ENCOUNTER — Other Ambulatory Visit: Payer: Self-pay

## 2015-01-12 DIAGNOSIS — G4733 Obstructive sleep apnea (adult) (pediatric): Secondary | ICD-10-CM

## 2015-01-12 NOTE — Telephone Encounter (Signed)
Spoke to pt, she needs an order for supplies sent to [facility] because [facility] is no longer providing cpap supplies. I verbalized that I would send the order to [facility]. Pt verbalized understanding.

## 2015-01-12 NOTE — Telephone Encounter (Signed)
Patient called stating the company that had CPAP supplies lost the bid and presently CPAP supplies have to be sent to [facility]. They will be requesting updated supply RX and she needs them asap.  Just an FYI.

## 2015-01-20 DIAGNOSIS — G4733 Obstructive sleep apnea (adult) (pediatric): Secondary | ICD-10-CM | POA: Diagnosis not present

## 2015-01-20 DIAGNOSIS — M79601 Pain in right arm: Secondary | ICD-10-CM | POA: Diagnosis not present

## 2015-01-20 DIAGNOSIS — E669 Obesity, unspecified: Secondary | ICD-10-CM | POA: Diagnosis not present

## 2015-01-20 DIAGNOSIS — M461 Sacroiliitis, not elsewhere classified: Secondary | ICD-10-CM | POA: Diagnosis not present

## 2015-01-20 DIAGNOSIS — F329 Major depressive disorder, single episode, unspecified: Secondary | ICD-10-CM | POA: Diagnosis not present

## 2015-01-20 DIAGNOSIS — F4310 Post-traumatic stress disorder, unspecified: Secondary | ICD-10-CM | POA: Diagnosis not present

## 2015-01-20 DIAGNOSIS — M79604 Pain in right leg: Secondary | ICD-10-CM | POA: Diagnosis not present

## 2015-01-20 DIAGNOSIS — K219 Gastro-esophageal reflux disease without esophagitis: Secondary | ICD-10-CM | POA: Diagnosis not present

## 2015-01-20 DIAGNOSIS — F332 Major depressive disorder, recurrent severe without psychotic features: Secondary | ICD-10-CM | POA: Diagnosis not present

## 2015-01-20 DIAGNOSIS — Z79891 Long term (current) use of opiate analgesic: Secondary | ICD-10-CM | POA: Diagnosis not present

## 2015-01-20 DIAGNOSIS — M549 Dorsalgia, unspecified: Secondary | ICD-10-CM | POA: Diagnosis not present

## 2015-01-20 DIAGNOSIS — M79605 Pain in left leg: Secondary | ICD-10-CM | POA: Diagnosis not present

## 2015-01-26 ENCOUNTER — Telehealth: Payer: Self-pay | Admitting: Neurology

## 2015-01-26 NOTE — Telephone Encounter (Signed)
Spoke to pt, she said that [facility] called her 5 minutes ago and everything is good, she is able to get her cpap supplies. Pt verbalized understanding to call with further questions or concerns.

## 2015-01-26 NOTE — Telephone Encounter (Signed)
Patient is calling and is very upset as she needs an order sent for new CPAP supplies. Please see message 8/2.  Please call.

## 2015-02-06 ENCOUNTER — Ambulatory Visit (INDEPENDENT_AMBULATORY_CARE_PROVIDER_SITE_OTHER): Payer: Medicare Other | Admitting: Family Medicine

## 2015-02-06 ENCOUNTER — Other Ambulatory Visit: Payer: Self-pay

## 2015-02-06 VITALS — BP 126/84 | HR 75 | Temp 98.5°F | Ht 64.5 in | Wt 288.0 lb

## 2015-02-06 DIAGNOSIS — R110 Nausea: Secondary | ICD-10-CM | POA: Diagnosis not present

## 2015-02-06 DIAGNOSIS — K3184 Gastroparesis: Secondary | ICD-10-CM | POA: Diagnosis not present

## 2015-02-06 DIAGNOSIS — E538 Deficiency of other specified B group vitamins: Secondary | ICD-10-CM

## 2015-02-06 LAB — COMPLETE METABOLIC PANEL WITH GFR
ALT: 10 U/L (ref 6–29)
AST: 12 U/L (ref 10–35)
Albumin: 4.4 g/dL (ref 3.6–5.1)
Alkaline Phosphatase: 45 U/L (ref 33–130)
BUN: 28 mg/dL — ABNORMAL HIGH (ref 7–25)
CO2: 22 mmol/L (ref 20–31)
Calcium: 9.5 mg/dL (ref 8.6–10.4)
Chloride: 102 mmol/L (ref 98–110)
Creat: 1.67 mg/dL — ABNORMAL HIGH (ref 0.50–1.05)
GFR, Est African American: 40 mL/min — ABNORMAL LOW (ref >=60)
GFR, Est Non African American: 35 mL/min — ABNORMAL LOW (ref >=60)
Glucose, Bld: 114 mg/dL — ABNORMAL HIGH (ref 65–99)
Potassium: 5 mmol/L (ref 3.5–5.3)
Sodium: 134 mmol/L — ABNORMAL LOW (ref 135–146)
Total Bilirubin: 0.3 mg/dL (ref 0.2–1.2)
Total Protein: 7.3 g/dL (ref 6.1–8.1)

## 2015-02-06 LAB — POCT CBC
Granulocyte percent: 63.1 %G (ref 37–80)
HCT, POC: 40 % (ref 37.7–47.9)
Hemoglobin: 12.5 g/dL (ref 12.2–16.2)
Lymph, poc: 2.8 (ref 0.6–3.4)
MCH, POC: 28.3 pg (ref 27–31.2)
MCHC: 31.3 g/dL — AB (ref 31.8–35.4)
MCV: 90.4 fL (ref 80–97)
MID (cbc): 0.5 (ref 0–0.9)
MPV: 8.3 fL (ref 0–99.8)
POC Granulocyte: 5.7 (ref 2–6.9)
POC LYMPH PERCENT: 30.9 %L (ref 10–50)
POC MID %: 6 %M (ref 0–12)
Platelet Count, POC: 303 10*3/uL (ref 142–424)
RBC: 4.43 M/uL (ref 4.04–5.48)
RDW, POC: 14.1 %
WBC: 9.1 10*3/uL (ref 4.6–10.2)

## 2015-02-06 LAB — LIPASE: Lipase: 57 U/L (ref 7–60)

## 2015-02-06 LAB — POCT SEDIMENTATION RATE: POCT SED RATE: 34 mm/hr — AB (ref 0–22)

## 2015-02-06 MED ORDER — BACLOFEN 10 MG PO TABS: ORAL_TABLET | ORAL | Status: DC

## 2015-02-06 MED ORDER — SPIRONOLACTONE 100 MG PO TABS: 100.0000 mg | ORAL_TABLET | Freq: Every day | ORAL | Status: DC

## 2015-02-06 NOTE — Progress Notes (Signed)
@[email or web address] @  This chart was scribed for Valerie Haber, MD by Valerie [name], ED Scribe. This patient was seen in room 2 and the patient's care was started at 9:19 AM.  Patient ID: Valerie [name] MRN: [id number], DOB: 02/08/1961, 54 y.o. Date of Encounter: 02/06/2015, 9:13 AM  Primary Physician: Valerie Haber, MD  Chief Complaint:  Chief Complaint  Patient presents with  . Nausea    still ongoing from previous visit  . Diarrhea    HPI: 54 y.o. year old female with history below presents with persistent nausea for about 1.5 months. Pt was seen here 7/17 for nausea and emesis. I have been emailing pt for the past couple week in regards of her not feeling well. Pt states symptoms have persisted since last visit. She IBS symptoms reports 4 days ago, consisting of severe abdominal cramping, diaphoresis, and darkened vision, No LOC, that occurred after eating. She also had some light headedness a couple days ago while repairing a friends car windows.  She has been eating 1 solid meal a day. Pt believes symptoms are relates to gastroparesis. Pt has an appointment in with Dr. Amedeo Plenty in 3 weeks.      Past Medical History  Diagnosis Date  . Hypertension   . Anxiety   . Mental disorder   . Depression   . Sleep apnea   . Headache(784.0)   . Neuromuscular disorder   . 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 Fibromyalgia   . Arthritis     degenerative  . Anemia 05/01/2012  . Gastro-esophageal reflux   . Sleep apnea     CPAP-9  . Pancreatitis   . Diarrhea     EPISODIC  . Back pain     lower     Home Meds: Prior to Admission medications   Medication Sig Start Date End Date Taking? Authorizing Provider  albuterol (PROVENTIL HFA;VENTOLIN HFA) 108 (90 BASE) MCG/ACT inhaler Inhale 2 puffs into the lungs as needed for wheezing or shortness of breath. 02/03/14  Yes Valerie Haber, MD  allopurinol (ZYLOPRIM) 300 MG tablet Take 150 mg by mouth daily.   Yes Historical Provider, MD   ALPRAZolam Duanne Moron) 1 MG tablet Take 1 tablet (1 mg total) by mouth 3 (three) times daily as needed for anxiety. 08/27/14  Yes Valerie Haber, MD  baclofen (LIORESAL) 10 MG tablet TAKE 1 TABLET (10 MG TOTAL) BY MOUTH 3 (THREE TIMES DAILY) 12/23/14  Yes [name], PA-C  benazepril (LOTENSIN) 40 MG tablet TAKE 1 TABLET (40 MG TOTAL) BY MOUTH DAILY. 12/31/14  Yes Valerie Haber, MD  Biotin 5000 MCG CAPS Take 10,000 mcg by mouth daily.    Yes Historical Provider, MD  butalbital-acetaminophen-caffeine (FIORICET, ESGIC) 50-325-40 MG per tablet Take 1-2 tablets by mouth 2 (two) times daily as needed for headache or migraine.   Yes Historical Provider, MD  cetirizine (ZYRTEC) 10 MG tablet Take 10 mg by mouth daily.   Yes Historical Provider, MD  Cholecalciferol (VITAMIN D) 2000 UNITS tablet Take 2,000 Units by mouth daily.   Yes Historical Provider, MD  fluticasone (FLONASE) 50 MCG/ACT nasal spray Place 2 sprays into both nostrils daily. 09/30/14  Yes Valerie Haber, MD  furosemide (LASIX) 20 MG tablet TAKE 1 TABLET BY MOUTH DAILY AS NEEDED 11/19/14  Yes Valerie Haber, MD  HYDROcodone-acetaminophen (NORCO) 10-325 MG per tablet Take 1-2 tablets by mouth every 6 (six) hours as needed (pain.).   Yes Historical Provider, MD  ibuprofen (ADVIL,MOTRIN) 600  MG tablet Take 600 mg by mouth every 6 (six) hours as needed for mild pain.    Yes Historical Provider, MD  metoprolol succinate (TOPROL-XL) 100 MG 24 hr tablet Take 1 tablet (100 mg total) by mouth daily. Take with or immediately following a meal. 11/02/14  Yes Valerie Haber, MD  modafinil (PROVIGIL) 200 MG tablet Take 0.5 tablets (100 mg total) by mouth 2 (two) times daily. 12/02/14  Yes Carmen Dohmeier, MD  montelukast (SINGULAIR) 10 MG tablet TAKE 1 TABLET (10 MG TOTAL) BY MOUTH AT BEDTIME 12/03/14  Yes Bennett Scrape V, PA-C  morphine (MS CONTIN) 30 MG 12 hr tablet Take 30 mg by mouth every 8 (eight) hours. Not from [facility] 09/23/12  Yes Historical Provider, MD   Multiple Vitamins-Minerals (MULTIVITAMIN PO) Take 1 tablet by mouth daily.   Yes Historical Provider, MD  Pancrelipase, Lip-Prot-Amyl, (CREON) 24000 UNITS CPEP Take 48,000 Units by mouth 4 (four) times daily as needed (for pancreatitis).   Yes Historical Provider, MD  PATADAY 0.2 % SOLN APPLY 1 DROP TO EYE 2 (TWO) TIMES DAILY. 06/11/14  Yes Valerie Haber, MD  promethazine (PHENERGAN) 25 MG tablet Take 1 tablet (25 mg total) by mouth every 6 (six) hours as needed for nausea or vomiting. 12/27/14  Yes Valerie Haber, MD  spironolactone (ALDACTONE) 100 MG tablet Take 100 mg by mouth daily.   Yes Historical Provider, MD  sucralfate (CARAFATE) 1 GM/10ML suspension Take 10 mLs (1 g total) by mouth 4 (four) times daily -  with meals and at bedtime. 12/27/14  Yes Valerie Haber, MD    Allergies:  Allergies  Allergen Reactions  . Dilaudid [Hydromorphone Hcl] Other (See Comments)    Headache   . Pregabalin   . Metoclopramide Hcl Other (See Comments)    "crawling inside"  . Neurontin [Gabapentin] Other (See Comments)    Tremors   . Other Other (See Comments)    Avoid all SSRI's and SNRI's- damaged pancreas  . Reglan [Metoclopramide]     Social History   Social History  . Marital Status: Single    Spouse Name: N/A  . Number of Children: 0  . Years of Education: College   Occupational History  . disabled    Social History Main Topics  . Smoking status: Never Smoker   . Smokeless tobacco: Never Used  . Alcohol Use: No  . Drug Use: No  . Sexual Activity: Yes   Other Topics Concern  . Not on file   Social History Narrative   Caffeine 2 cups iced tea daily avg.     Review of Systems: Constitutional: negative for chills, fever, night sweats, weight changes, or fatigue  HEENT: negative for vision changes, hearing loss, congestion, rhinorrhea, ST, epistaxis, or sinus pressure Cardiovascular: negative for chest pain or palpitations Respiratory: negative for hemoptysis, wheezing,  shortness of breath, or cough Abdominal: negative for abdominal pain diarrhea or constipation. Positive nausea Dermatological: negative for rash Neurologic: negative for headache or syncope. All other systems reviewed and are otherwise negative with the exception to those above and in the HPI.  Physical Exam: Blood pressure 126/84, pulse 75, temperature 98.5 F (36.9 C), temperature source Oral, height 5' 4.5" (1.638 m), weight 288 lb (130.636 kg), SpO2 98 %., Body mass index is 48.69 kg/(m^2). General: Well developed, well nourished, in no acute distress. Head: Normocephalic, atraumatic, eyes without discharge, sclera non-icteric, nares are without discharge. Bilateral auditory canals clear, TM's are without perforation, pearly grey and translucent with reflective cone  of light bilaterally. Oral cavity moist, posterior pharynx without exudate, erythema, peritonsillar abscess, or post nasal drip.  Neck: Supple. No thyromegaly. Full ROM. No lymphadenopathy. Lungs: Clear bilaterally to auscultation without wheezes, rales, or rhonchi. Breathing is unlabored. Heart: RRR with S1 S2. No murmurs, rubs, or gallops appreciated. Abdomen: Soft, tenderness to epigastrium and RLQ, non-distended with normoactive bowel sounds. No hepatomegaly. No rebound/guarding. No obvious abdominal masses. Msk:  Strength and tone normal for age. Extremities/Skin: Warm and dry. No clubbing or cyanosis. No edema. No rashes or suspicious lesions. Neuro: Alert and oriented X 3. Moves all extremities spontaneously. Gait is normal. CNII-XII grossly in tact. Psych:  Responds to questions appropriately with a normal affect.   Labs: Results for orders placed or performed in visit on 12/27/14  Amylase  Result Value Ref Range   Amylase 44 0 - 105 U/L  Lipase  Result Value Ref Range   Lipase 69 0 - 75 U/L  COMPLETE METABOLIC PANEL WITH GFR  Result Value Ref Range   Sodium 141 135 - 145 mEq/L   Potassium 5.0 3.5 - 5.3 mEq/L    Chloride 106 96 - 112 mEq/L   CO2 25 19 - 32 mEq/L   Glucose, Bld 107 (H) 70 - 99 mg/dL   BUN 13 6 - 23 mg/dL   Creat 1.14 (H) 0.50 - 1.10 mg/dL   Total Bilirubin 0.4 0.2 - 1.2 mg/dL   Alkaline Phosphatase 41 39 - 117 U/L   AST 14 0 - 37 U/L   ALT 14 0 - 35 U/L   Total Protein 7.2 6.0 - 8.3 g/dL   Albumin 4.3 3.5 - 5.2 g/dL   Calcium 9.8 8.4 - 10.5 mg/dL   GFR, Est African American 64 mL/min   GFR, Est Non African American 55 (L) mL/min  POCT CBC  Result Value Ref Range   WBC 7.4 4.6 - 10.2 K/uL   Lymph, poc 2.0 0.6 - 3.4   POC LYMPH PERCENT 27.6 10 - 50 %L   MID (cbc) 0.3 0 - 0.9   POC MID % 3.6 0 - 12 %M   POC Granulocyte 5.1 2 - 6.9   Granulocyte percent 68.8 37 - 80 %G   RBC 4.08 4.04 - 5.48 M/uL   Hemoglobin 12.4 12.2 - 16.2 g/dL   HCT, POC 36.8 (A) 37.7 - 47.9 %   MCV 90.3 80 - 97 fL   MCH, POC 30.3 27 - 31.2 pg   MCHC 33.6 31.8 - 35.4 g/dL   RDW, POC 13.8 %   Platelet Count, POC 299 142 - 424 K/uL   MPV 7.5 0 - 99.8 fL  POCT UA - Microscopic Only  Result Value Ref Range   WBC, Ur, HPF, POC 0-1    RBC, urine, microscopic 0-1    Bacteria, U Microscopic negative    Mucus, UA negative    Epithelial cells, urine per micros 1-9    Crystals, Ur, HPF, POC negative    Casts, Ur, LPF, POC negative    Yeast, UA negative   POCT urinalysis dipstick  Result Value Ref Range   Color, UA yellow    Clarity, UA slightly cloudy    Glucose, UA negative    Bilirubin, UA negative    Ketones, UA negative    Spec Grav, UA 1.010    Blood, UA negative    pH, UA 6.0    Protein, UA negative    Urobilinogen, UA 0.2    Nitrite, UA  negative    Leukocytes, UA Negative Negative      ASSESSMENT AND PLAN:  54 y.o. year old female with  This chart was scribed in my presence and reviewed by me personally.    ICD-9-CM ICD-10-CM   1. Gastroparesis 536.3 K31.84 POCT CBC     POCT SEDIMENTATION RATE     COMPLETE METABOLIC PANEL WITH GFR     Lipase  2. Nausea without vomiting 787.02  R11.0 POCT CBC     POCT SEDIMENTATION RATE     COMPLETE METABOLIC PANEL WITH GFR     Lipase  3. B12 deficiency 266.2 E53.8      Signed, Valerie Haber, MD    Signed, Valerie Haber, MD 02/06/2015 9:13 AM

## 2015-02-07 ENCOUNTER — Telehealth: Payer: Self-pay

## 2015-02-07 ENCOUNTER — Other Ambulatory Visit: Payer: Self-pay | Admitting: Family Medicine

## 2015-02-07 ENCOUNTER — Encounter: Payer: Self-pay | Admitting: Family Medicine

## 2015-02-07 DIAGNOSIS — R7989 Other specified abnormal findings of blood chemistry: Secondary | ICD-10-CM

## 2015-02-07 NOTE — Telephone Encounter (Signed)
Patient returning phone call for lab results.

## 2015-02-07 NOTE — Telephone Encounter (Signed)
Spoke with patient about labs 

## 2015-02-09 DIAGNOSIS — F332 Major depressive disorder, recurrent severe without psychotic features: Secondary | ICD-10-CM | POA: Diagnosis not present

## 2015-02-10 ENCOUNTER — Telehealth: Payer: Self-pay

## 2015-02-10 NOTE — Telephone Encounter (Signed)
Please ask [name] to hold ibuprofen and NSAID medication for one week and we'll recheck the kidneys. She actually may feel better by doing this KL

## 2015-02-10 NOTE — Telephone Encounter (Signed)
[facility] called and needs the patient to discontinue the use of ibuprofen and NSAIDs.  They need [place] to recheck her BMET next week and have the results faxed over to them.  Please advise patient.  Thank you.  [facility] CB#: [phone]; Fax #: [phone] Patient CB#: [phone]

## 2015-02-11 NOTE — Telephone Encounter (Signed)
Spoke with pt, advised message from Dr. Joseph Art. She states she does not see [place] [facility]. She has her own nephrologist. Pt stated she e-mailed you about this.  Dr. Trudi Ida [place]

## 2015-02-12 ENCOUNTER — Telehealth: Payer: Self-pay | Admitting: Neurology

## 2015-02-12 NOTE — Telephone Encounter (Signed)
Valerie [name] with [facility] is inquiring is CPAP supply document could be signed, dated and faxed abck., The fax number is [phone]. She is aware Dr Brett Fairy is out of the office until Tuesday.

## 2015-02-18 NOTE — Telephone Encounter (Signed)
I faxed referral order to [phone] ([phone]).

## 2015-02-26 DIAGNOSIS — K3184 Gastroparesis: Secondary | ICD-10-CM | POA: Diagnosis not present

## 2015-03-04 ENCOUNTER — Ambulatory Visit (INDEPENDENT_AMBULATORY_CARE_PROVIDER_SITE_OTHER): Payer: Medicare Other | Admitting: Family Medicine

## 2015-03-04 ENCOUNTER — Other Ambulatory Visit: Payer: Self-pay

## 2015-03-04 VITALS — BP 116/82 | HR 84 | Temp 98.6°F | Resp 18 | Wt 291.0 lb

## 2015-03-04 DIAGNOSIS — R519 Headache, unspecified: Secondary | ICD-10-CM

## 2015-03-04 DIAGNOSIS — R300 Dysuria: Secondary | ICD-10-CM | POA: Diagnosis not present

## 2015-03-04 DIAGNOSIS — R51 Headache: Secondary | ICD-10-CM | POA: Diagnosis not present

## 2015-03-04 LAB — POCT URINALYSIS DIP (MANUAL ENTRY)
Bilirubin, UA: NEGATIVE
Blood, UA: NEGATIVE
Glucose, UA: NEGATIVE
Ketones, POC UA: NEGATIVE
Leukocytes, UA: NEGATIVE
Nitrite, UA: NEGATIVE
Protein Ur, POC: NEGATIVE
Spec Grav, UA: 1.015
Urobilinogen, UA: 0.2
pH, UA: 5.5

## 2015-03-04 LAB — POC MICROSCOPIC URINALYSIS (UMFC): Mucus: ABSENT

## 2015-03-04 MED ORDER — SULFAMETHOXAZOLE-TRIMETHOPRIM 800-160 MG PO TABS: 1.0000 | ORAL_TABLET | Freq: Two times a day (BID) | ORAL | Status: DC

## 2015-03-04 MED ORDER — ALBUTEROL SULFATE HFA 108 (90 BASE) MCG/ACT IN AERS: 2.0000 | INHALATION_SPRAY | RESPIRATORY_TRACT | Status: DC | PRN

## 2015-03-04 MED ORDER — FLUCONAZOLE 150 MG PO TABS: 150.0000 mg | ORAL_TABLET | Freq: Once | ORAL | Status: DC

## 2015-03-04 NOTE — Progress Notes (Signed)
This chart was scribed for [name], MD by Moises Blood, medical scribe at [facility].The patient was seen in exam room 10 and the patient's care was started at 8:53 AM.  Patient ID: Valerie [name] MRN: [id number], DOB: 1960-06-17, 54 y.o. Date of Encounter: 03/04/2015  Primary Physician: [name], MD  Chief Complaint:  Chief Complaint  Patient presents with  . burning with urination    off and on x2 weeks   . bladder pain    HPI:  Valerie [name] is a 54 y.o. female who presents to [facility] complaining of UTI symptoms noticed 2 weeks ago.  She has bladder pain, and intermittent dysuria. She had a headache on the top of her head that won't go away that started 3 days ago.   She had some sinus pressure a few weeks ago, but it has mildly resolved.   She went to see Dr. Amedeo Plenty (for stomach) last week.   Past Medical History  Diagnosis Date  . Hypertension   . Anxiety   . Mental disorder   . Depression   . Sleep apnea   . Headache(784.0)   . Neuromuscular disorder   . 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 Fibromyalgia   . Arthritis     degenerative  . Anemia 05/01/2012  . Gastro-esophageal reflux   . Sleep apnea     CPAP-9  . Pancreatitis   . Diarrhea     EPISODIC  . Back pain     lower     Home Meds: Prior to Admission medications   Medication Sig Start Date End Date Taking? Authorizing Provider  albuterol (PROVENTIL HFA;VENTOLIN HFA) 108 (90 BASE) MCG/ACT inhaler Inhale 2 puffs into the lungs as needed for wheezing or shortness of breath. 02/03/14   [name], MD  allopurinol (ZYLOPRIM) 300 MG tablet Take 150 mg by mouth daily.    Historical Provider, MD  ALPRAZolam Duanne Moron) 1 MG tablet Take 1 tablet (1 mg total) by mouth 3 (three) times daily as needed for anxiety. 08/27/14   [name], MD  baclofen (LIORESAL) 10 MG tablet TAKE 1 TABLET (10 MG TOTAL) BY MOUTH 3 (THREE TIMES DAILY) 02/06/15    [name], MD  benazepril (LOTENSIN) 40 MG tablet TAKE 1 TABLET (40 MG TOTAL) BY MOUTH DAILY. 12/31/14   [name], MD  Biotin 5000 MCG CAPS Take 10,000 mcg by mouth daily.     Historical Provider, MD  butalbital-acetaminophen-caffeine (FIORICET, ESGIC) 50-325-40 MG per tablet Take 1-2 tablets by mouth 2 (two) times daily as needed for headache or migraine.    Historical Provider, MD  cetirizine (ZYRTEC) 10 MG tablet Take 10 mg by mouth daily.    Historical Provider, MD  Cholecalciferol (VITAMIN D) 2000 UNITS tablet Take 2,000 Units by mouth daily.    Historical Provider, MD  fluticasone (FLONASE) 50 MCG/ACT nasal spray Place 2 sprays into both nostrils daily. 09/30/14   [name], MD  furosemide (LASIX) 20 MG tablet TAKE 1 TABLET BY MOUTH DAILY AS NEEDED 11/19/14   [name], MD  HYDROcodone-acetaminophen [facility]) 10-325 MG per tablet Take 1-2 tablets by mouth every 6 (six) hours as needed (pain.).    Historical Provider, MD  ibuprofen (ADVIL,MOTRIN) 600 MG tablet Take 600 mg by mouth every 6 (six) hours as needed for mild pain.     Historical Provider, MD  metoprolol succinate (TOPROL-XL) 100 MG 24 hr tablet  Take 1 tablet (100 mg total) by mouth daily. Take with or immediately following a meal. 11/02/14   [name], MD  modafinil (PROVIGIL) 200 MG tablet Take 0.5 tablets (100 mg total) by mouth 2 (two) times daily. 12/02/14   Carmen Dohmeier, MD  montelukast (SINGULAIR) 10 MG tablet TAKE 1 TABLET (10 MG TOTAL) BY MOUTH AT BEDTIME 12/03/14   Bennett Scrape V, PA-C  morphine (MS CONTIN) 30 MG 12 hr tablet Take 30 mg by mouth every 8 (eight) hours. Not from [facility] 09/23/12   Historical Provider, MD  Multiple Vitamins-Minerals (MULTIVITAMIN PO) Take 1 tablet by mouth daily.    Historical Provider, MD  Pancrelipase, Lip-Prot-Amyl, (CREON) 24000 UNITS CPEP Take 48,000 Units by mouth 4 (four) times daily as needed (for pancreatitis).    Historical Provider, MD  PATADAY 0.2 % SOLN APPLY 1  DROP TO EYE 2 (TWO) TIMES DAILY. 06/11/14   [name], MD  promethazine (PHENERGAN) 25 MG tablet Take 1 tablet (25 mg total) by mouth every 6 (six) hours as needed for nausea or vomiting. 12/27/14   [name], MD  spironolactone (ALDACTONE) 100 MG tablet Take 1 tablet (100 mg total) by mouth daily. 02/06/15   [name], MD  sucralfate (CARAFATE) 1 GM/10ML suspension Take 10 mLs (1 g total) by mouth 4 (four) times daily -  with meals and at bedtime. 12/27/14   [name], MD    Allergies:  Allergies  Allergen Reactions  . Dilaudid [Hydromorphone Hcl] Other (See Comments)    Headache   . Pregabalin   . Metoclopramide Hcl Other (See Comments)    "crawling inside"  . Neurontin [Gabapentin] Other (See Comments)    Tremors   . Other Other (See Comments)    Avoid all SSRI's and SNRI's- damaged pancreas  . Reglan [Metoclopramide]     Social History   Social History  . Marital Status: Single    Spouse Name: N/A  . Number of Children: 0  . Years of Education: College   Occupational History  . disabled    Social History Main Topics  . Smoking status: Never Smoker   . Smokeless tobacco: Never Used  . Alcohol Use: No  . Drug Use: No  . Sexual Activity: Yes   Other Topics Concern  . Not on file   Social History Narrative   Caffeine 2 cups iced tea daily avg.     Review of Systems: Constitutional: negative for chills, fever, night sweats, weight changes, or fatigue  HEENT: negative for vision changes, hearing loss, congestion, rhinorrhea, ST, epistaxis, or sinus pressure Cardiovascular: negative for chest pain or palpitations Respiratory: negative for hemoptysis, wheezing, shortness of breath, or cough Abdominal: negative for abdominal pain, nausea, vomiting, diarrhea, or constipation Dermatological: negative for rash Neurologic: negative for dizziness, or syncope; positive for headache GU: positive for dysuria, bladder pain All other systems reviewed and  are otherwise negative with the exception to those above and in the HPI.  Physical Exam: Blood pressure 116/82, pulse 84, temperature 98.6 F (37 C), temperature source Oral, resp. rate 18, weight 291 lb (131.997 kg), SpO2 98 %., Body mass index is 49.2 kg/(m^2). General: Well developed, well nourished, in no acute distress. Head: Normocephalic, atraumatic, eyes without discharge, sclera non-icteric, nares are without discharge. Bilateral auditory canals clear, TM's are without perforation, pearly grey and translucent with reflective cone of light bilaterally. Oral cavity moist, posterior pharynx without exudate, erythema, peritonsillar abscess, or post nasal drip.  Neck: Supple. No thyromegaly.  Full ROM. No lymphadenopathy. Lungs: Clear bilaterally to auscultation without wheezes, rales, or rhonchi. Breathing is unlabored. Heart: RRR with S1 S2. No murmurs, rubs, or gallops appreciated. Abdomen: Soft, non-tender, non-distended with normoactive bowel sounds. No hepatomegaly. No rebound/guarding. No obvious abdominal masses. Msk:  Strength and tone normal for age. Extremities/Skin: Warm and dry. No clubbing or cyanosis. No edema. No rashes or suspicious lesions. Neuro: Alert and oriented X 3. Moves all extremities spontaneously. Gait is normal. CNII-XII grossly in tact. Psych:  Responds to questions appropriately with a normal affect.   Labs: Results for orders placed or performed in visit on 03/04/15  POCT urinalysis dipstick  Result Value Ref Range   Color, UA yellow yellow   Clarity, UA clear clear   Glucose, UA negative negative   Bilirubin, UA negative negative   Ketones, POC UA negative negative   Spec Grav, UA 1.015    Blood, UA negative negative   pH, UA 5.5    Protein Ur, POC negative negative   Urobilinogen, UA 0.2    Nitrite, UA Negative Negative   Leukocytes, UA Negative Negative  POCT Microscopic Urinalysis ([facility])  Result Value Ref Range   WBC,UR,HPF,POC Few (A) None  WBC/hpf   RBC,UR,HPF,POC None None RBC/hpf   Bacteria None None   Mucus Absent Absent   Epithelial Cells, UR Per Microscopy Moderate (A) None cells/hpf     ASSESSMENT AND PLAN:  54 y.o. year old female with dysuria and frequency. Her headaches have subsided over the last 24 hours having started without provocation on Monday. This chart was scribed in my presence and reviewed by me personally.    ICD-9-CM ICD-10-CM   1. Dysuria 788.1 R30.0 POCT urinalysis dipstick     POCT Microscopic Urinalysis ([facility])     Urine culture     Signed, [name], MD 03/04/2015 8:53 AM

## 2015-03-04 NOTE — Patient Instructions (Signed)
Your urine only shows slight infection. We are running a urine culture which should be back in a couple days. In the meantime I'm putting on antibiotic and anti-yeast medicine. I want to know if you develop any side effects such as nausea, vomiting, diarrhea, return of the headache or rash.

## 2015-03-05 LAB — URINE CULTURE
Colony Count: NO GROWTH
Organism ID, Bacteria: NO GROWTH

## 2015-03-08 ENCOUNTER — Telehealth: Payer: Self-pay

## 2015-03-08 NOTE — Telephone Encounter (Signed)
-----   Message from [name], MD sent at 03/05/2015  2:05 PM EDT ----- Please inform patient of normal result

## 2015-03-08 NOTE — Telephone Encounter (Signed)
See labs 

## 2015-03-16 DIAGNOSIS — Z23 Encounter for immunization: Secondary | ICD-10-CM | POA: Diagnosis not present

## 2015-04-01 ENCOUNTER — Ambulatory Visit (INDEPENDENT_AMBULATORY_CARE_PROVIDER_SITE_OTHER): Payer: Medicare Other | Admitting: Family Medicine

## 2015-04-01 VITALS — BP 122/84 | HR 76 | Temp 98.9°F | Resp 16 | Ht 64.5 in | Wt 290.6 lb

## 2015-04-01 DIAGNOSIS — R7989 Other specified abnormal findings of blood chemistry: Secondary | ICD-10-CM | POA: Diagnosis not present

## 2015-04-01 DIAGNOSIS — K3184 Gastroparesis: Secondary | ICD-10-CM

## 2015-04-01 DIAGNOSIS — J309 Allergic rhinitis, unspecified: Secondary | ICD-10-CM | POA: Diagnosis not present

## 2015-04-01 DIAGNOSIS — R21 Rash and other nonspecific skin eruption: Secondary | ICD-10-CM

## 2015-04-01 DIAGNOSIS — F411 Generalized anxiety disorder: Secondary | ICD-10-CM | POA: Diagnosis not present

## 2015-04-01 DIAGNOSIS — R799 Abnormal finding of blood chemistry, unspecified: Secondary | ICD-10-CM | POA: Diagnosis not present

## 2015-04-01 LAB — COMPLETE METABOLIC PANEL WITH GFR
ALT: 13 U/L (ref 6–29)
AST: 14 U/L (ref 10–35)
Albumin: 4.4 g/dL (ref 3.6–5.1)
Alkaline Phosphatase: 50 U/L (ref 33–130)
BUN: 26 mg/dL — ABNORMAL HIGH (ref 7–25)
CO2: 21 mmol/L (ref 20–31)
Calcium: 9.4 mg/dL (ref 8.6–10.4)
Chloride: 103 mmol/L (ref 98–110)
Creat: 1.32 mg/dL — ABNORMAL HIGH (ref 0.50–1.05)
GFR, Est African American: 54 mL/min — ABNORMAL LOW (ref >=60)
GFR, Est Non African American: 46 mL/min — ABNORMAL LOW (ref >=60)
Glucose, Bld: 127 mg/dL — ABNORMAL HIGH (ref 65–99)
Potassium: 4.7 mmol/L (ref 3.5–5.3)
Sodium: 135 mmol/L (ref 135–146)
Total Bilirubin: 0.3 mg/dL (ref 0.2–1.2)
Total Protein: 7.3 g/dL (ref 6.1–8.1)

## 2015-04-01 MED ORDER — ALPRAZOLAM 1 MG PO TABS: 1.0000 mg | ORAL_TABLET | Freq: Three times a day (TID) | ORAL | Status: DC | PRN

## 2015-04-01 MED ORDER — METOPROLOL SUCCINATE ER 100 MG PO TB24: 100.0000 mg | ORAL_TABLET | Freq: Every day | ORAL | Status: DC

## 2015-04-01 MED ORDER — ALLOPURINOL 300 MG PO TABS: 150.0000 mg | ORAL_TABLET | Freq: Every day | ORAL | Status: DC

## 2015-04-01 MED ORDER — BENAZEPRIL HCL 40 MG PO TABS: ORAL_TABLET | ORAL | Status: DC

## 2015-04-01 MED ORDER — CLOTRIMAZOLE-BETAMETHASONE 1-0.05 % EX CREA: 1.0000 "application " | TOPICAL_CREAM | Freq: Two times a day (BID) | CUTANEOUS | Status: DC

## 2015-04-01 MED ORDER — MONTELUKAST SODIUM 10 MG PO TABS: ORAL_TABLET | ORAL | Status: DC

## 2015-04-01 MED ORDER — SPIRONOLACTONE 100 MG PO TABS
100.0000 mg | ORAL_TABLET | Freq: Every day | ORAL | Status: DC
Start: 2015-04-01 — End: 2015-07-31

## 2015-04-01 NOTE — Progress Notes (Signed)
S:  Patient here for recheck of creatinine.  It had been rising over the last year  Her gastroparesis has been a problem, with botox last injected 13 months and Dr. Amedeo Plenty feels she needs to wait until symptoms begin to reappear.  Overall 13 years of problems with intermittent vomiting.  Patient has two weeks of right palmar rash with cracking skin.  She tried antibiotic cream for a couple days but it didn't help  She continues to take morphine twice daily.  Her left sciatic pain burns severely at times.   Ref Range 74mo ago  76mo ago  12mo ago      Sodium 135 - 146 mmol/L 134 (L) 141R 137R    Potassium 3.5 - 5.3 mmol/L 5.0 5.0R 5.1R    Chloride 98 - 110 mmol/L 102 106R 104R    CO2 20 - 31 mmol/L 22 25R 26R    Glucose, Bld 65 - 99 mg/dL 114 (H) 107 (H)R 127 (H)R    BUN 7 - 25 mg/dL 28 (H) 13R 21R    Creat 0.50 - 1.05 mg/dL 1.67 (H) 1.14 (H)R 1.19 (H)R    Total Bilirubin 0.2 - 1.2 mg/dL 0.3 0.4 0.4    Alkaline Phosphatase 33 - 130 U/L 45 41R 45R    AST 10 - 35 U/L 12 14R 14R    ALT 6 - 29 U/L 10 14R 13R    Total Protein 6.1 - 8.1 g/dL 7.3 7.2R 7.5R    Albumin 3.6 - 5.1 g/dL 4.4 4.3R 4.4R    Calcium 8.6 - 10.4 mg/dL 9.5 9.8R 9.9R    GFR, Est African American >=60 mL/min 40 (L) 64R     GFR, Est Non African American >=60 mL/min 35 (L) 55 (L)R, CM    Comments:            Results for orders placed or performed in visit on 03/04/15  Urine culture  Result Value Ref Range   Colony Count NO GROWTH    Organism ID, Bacteria NO GROWTH   POCT urinalysis dipstick  Result Value Ref Range   Color, UA yellow yellow   Clarity, UA clear clear   Glucose, UA negative negative   Bilirubin, UA negative negative   Ketones, POC UA negative negative   Spec Grav, UA 1.015    Blood, UA negative negative   pH, UA 5.5    Protein Ur, POC negative negative   Urobilinogen, UA 0.2    Nitrite, UA Negative Negative   Leukocytes, UA Negative Negative  POCT Microscopic Urinalysis ([facility])    Result Value Ref Range   WBC,UR,HPF,POC Few (A) None WBC/hpf   RBC,UR,HPF,POC None None RBC/hpf   Bacteria None None   Mucus Absent Absent   Epithelial Cells, UR Per Microscopy Moderate (A) None cells/hpf   Assessment:  Stable at present.  Probable eczema right hand.  Plan: This chart was scribed in my presence and reviewed by me personally.    ICD-9-CM ICD-10-CM   1. Azotemia 790.6 Z61.09 COMPLETE METABOLIC PANEL WITH GFR  2. Rash of hands 782.1 R21 clotrimazole-betamethasone (LOTRISONE) cream  3. Gastroparesis 536.3 K31.84      Signed, [name], MD

## 2015-04-22 DIAGNOSIS — E669 Obesity, unspecified: Secondary | ICD-10-CM | POA: Diagnosis not present

## 2015-04-22 DIAGNOSIS — F332 Major depressive disorder, recurrent severe without psychotic features: Secondary | ICD-10-CM | POA: Diagnosis not present

## 2015-04-22 DIAGNOSIS — M545 Low back pain: Secondary | ICD-10-CM | POA: Diagnosis not present

## 2015-04-22 DIAGNOSIS — Z79891 Long term (current) use of opiate analgesic: Secondary | ICD-10-CM | POA: Diagnosis not present

## 2015-04-22 DIAGNOSIS — M79605 Pain in left leg: Secondary | ICD-10-CM | POA: Diagnosis not present

## 2015-04-22 DIAGNOSIS — F4310 Post-traumatic stress disorder, unspecified: Secondary | ICD-10-CM | POA: Diagnosis not present

## 2015-04-22 DIAGNOSIS — M79604 Pain in right leg: Secondary | ICD-10-CM | POA: Diagnosis not present

## 2015-04-22 DIAGNOSIS — G4733 Obstructive sleep apnea (adult) (pediatric): Secondary | ICD-10-CM | POA: Diagnosis not present

## 2015-04-22 DIAGNOSIS — M461 Sacroiliitis, not elsewhere classified: Secondary | ICD-10-CM | POA: Diagnosis not present

## 2015-04-22 DIAGNOSIS — M79601 Pain in right arm: Secondary | ICD-10-CM | POA: Diagnosis not present

## 2015-04-22 DIAGNOSIS — F329 Major depressive disorder, single episode, unspecified: Secondary | ICD-10-CM | POA: Diagnosis not present

## 2015-05-01 ENCOUNTER — Ambulatory Visit (INDEPENDENT_AMBULATORY_CARE_PROVIDER_SITE_OTHER): Payer: Medicare Other | Admitting: Family Medicine

## 2015-05-01 VITALS — BP 120/82 | HR 71 | Temp 98.1°F | Ht 64.25 in | Wt 288.0 lb

## 2015-05-01 DIAGNOSIS — R1013 Epigastric pain: Secondary | ICD-10-CM

## 2015-05-01 MED ORDER — SUCRALFATE 1 GM/10ML PO SUSP: 1.0000 g | Freq: Three times a day (TID) | ORAL | Status: DC

## 2015-05-01 NOTE — Patient Instructions (Signed)
The discomfort may be from postnasal drainage secondary to the environmental changes were experiencing here in [place]. It also may because by a bacteria that has infected the lining of the stomach. The breath test should help [place] sort this out

## 2015-05-01 NOTE — Progress Notes (Signed)
This chart was scribed for [name], MD by Royann Shivers Rifaie medical scribe at [facility].The patient was seen in exam room 11 and the patient's care was started at 8:28 AM.  Patient ID: Valerie [name] MRN: [id number], DOB: [birth year], 54 y.o. Date of Encounter: 05/01/2015  Primary Physician: [name], MD  Chief Complaint:  Chief Complaint  Patient presents with   Abdominal Pain    c/o LUQ pain x 2.5 weeks - getting worse   Nausea   Allergies    seasonal - worse lately    HPI:  Valerie [name] is a 54 y.o. female who presents to [facility] complaining of a worsening LUQ abdominal pain, onset 2.5 weeks ago.  Pt reports that she feels a burning sensation in her abdomen primarily after waking up in the morning. She indicates that it is not similar to heart burn. She states that she has associated symptoms of nausea, and reports that when eating food she feels as if there is a bowl of rocks" in there, however the burning sensation does relief temporarily. Pt denies symptoms of diarrhea.   Pt states that her ability to breathe has been improved, however her post nasal drainage and congestion has worsened. She also reports that she presents with headaches that are located in the top of her head, chest tightness, and irregular heartbeats. Pt states that she experienced similar symptoms when she was not taking her Toprol medication, however this is not the case this time   Pt is requesting a medication refill for Carafate.   Past Medical History  Diagnosis Date   Hypertension    Anxiety    Mental disorder    Depression    Sleep apnea    Headache(784.0)    Neuromuscular disorder (HCC)    Nerve pain     nerve damage to right arm   Fibromyalgia    Arthritis     degenerative   Anemia 05/01/2012   Gastro-esophageal reflux    Sleep apnea     CPAP-9   Pancreatitis    Diarrhea     EPISODIC   Back pain    lower     Home Meds: Prior to Admission medications   Medication Sig Start Date End Date Taking? Authorizing Provider  albuterol (PROVENTIL HFA;VENTOLIN HFA) 108 (90 BASE) MCG/ACT inhaler Inhale 2 puffs into the lungs as needed for wheezing or shortness of breath. 03/04/15   [name], MD  allopurinol (ZYLOPRIM) 300 MG tablet Take 0.5 tablets (150 mg total) by mouth daily. 04/01/15   [name], MD  ALPRAZolam Duanne Moron) 1 MG tablet Take 1 tablet (1 mg total) by mouth 3 (three) times daily as needed for anxiety. 04/01/15   [name], MD  baclofen (LIORESAL) 10 MG tablet TAKE 1 TABLET (10 MG TOTAL) BY MOUTH 3 (THREE TIMES DAILY) 02/06/15   [name], MD  benazepril (LOTENSIN) 40 MG tablet TAKE 1 TABLET (40 MG TOTAL) BY MOUTH DAILY. 04/01/15   [name], MD  Biotin 5000 MCG CAPS Take 10,000 mcg by mouth daily.     Historical Provider, MD  butalbital-acetaminophen-caffeine (FIORICET, ESGIC) 50-325-40 MG per tablet Take 1-2 tablets by mouth 2 (two) times daily as needed for headache or migraine.    Historical Provider, MD  cetirizine (ZYRTEC) 10 MG tablet Take 10 mg by mouth daily.    Historical Provider, MD  Cholecalciferol (VITAMIN D) 2000 UNITS tablet Take 2,000 Units by mouth daily.  Historical Provider, MD  clotrimazole-betamethasone (LOTRISONE) cream Apply 1 application topically 2 (two) times daily. 04/01/15   [name], MD  fluconazole (DIFLUCAN) 150 MG tablet Take 1 tablet (150 mg total) by mouth once. Patient not taking: Reported on 04/01/2015 03/04/15   [name], MD  fluticasone [facility]) 50 MCG/ACT nasal spray Place 2 sprays into both nostrils daily. 09/30/14   [name], MD  furosemide (LASIX) 20 MG tablet TAKE 1 TABLET BY MOUTH DAILY AS NEEDED 11/19/14   [name], MD  HYDROcodone-acetaminophen [facility]) 10-325 MG per tablet Take 1-2 tablets by mouth every 6 (six) hours as needed (pain.).    Historical Provider, MD  ibuprofen (ADVIL,MOTRIN)  600 MG tablet Take 600 mg by mouth every 6 (six) hours as needed for mild pain.     Historical Provider, MD  metoprolol succinate (TOPROL-XL) 100 MG 24 hr tablet Take 1 tablet (100 mg total) by mouth daily. Take with or immediately following a meal. 04/01/15   [name], MD  modafinil (PROVIGIL) 200 MG tablet Take 0.5 tablets (100 mg total) by mouth 2 (two) times daily. 12/02/14   Carmen Dohmeier, MD  montelukast (SINGULAIR) 10 MG tablet TAKE 1 TABLET (10 MG TOTAL) BY MOUTH AT BEDTIME 04/01/15   [name], MD  morphine (MS CONTIN) 30 MG 12 hr tablet Take 30 mg by mouth every 8 (eight) hours. Not from [facility] 09/23/12   Historical Provider, MD  Multiple Vitamins-Minerals (MULTIVITAMIN PO) Take 1 tablet by mouth daily.    Historical Provider, MD  Pancrelipase, Lip-Prot-Amyl, (CREON) 24000 UNITS CPEP Take 48,000 Units by mouth 4 (four) times daily as needed (for pancreatitis).    Historical Provider, MD  PATADAY 0.2 % SOLN APPLY 1 DROP TO EYE 2 (TWO) TIMES DAILY. 06/11/14   [name], MD  promethazine (PHENERGAN) 25 MG tablet Take 1 tablet (25 mg total) by mouth every 6 (six) hours as needed for nausea or vomiting. 12/27/14   [name], MD  spironolactone (ALDACTONE) 100 MG tablet Take 1 tablet (100 mg total) by mouth daily. 04/01/15   [name], MD  sulfamethoxazole-trimethoprim (BACTRIM DS,SEPTRA DS) 800-160 MG per tablet Take 1 tablet by mouth 2 (two) times daily. Patient not taking: Reported on 04/01/2015 03/04/15   [name], MD    Allergies:  Allergies  Allergen Reactions   Dilaudid [Hydromorphone Hcl] Other (See Comments)    Headache    Pregabalin    Metoclopramide Hcl Other (See Comments)    "crawling inside"   Neurontin [Gabapentin] Other (See Comments)    Tremors    Other Other (See Comments)    Avoid all SSRI's and SNRI's- damaged pancreas   Reglan [Metoclopramide]     Social History   Social History   Marital Status: Single    Spouse Name:  N/A   Number of Children: 0   Years of Education: Copywriter, advertising History   disabled    Social History Main Topics   Smoking status: Never Smoker    Smokeless tobacco: Never Used   Alcohol Use: No   Drug Use: No   Sexual Activity: Yes   Other Topics Concern   Not on file   Social History Narrative   Caffeine 2 cups iced tea daily avg.     Review of Systems: Constitutional: negative for chills, fever, night sweats, weight changes, or fatigue  HEENT: negative for vision changes, hearing loss, rhinorrhea, ST, epistaxis, or sinus pressure. Positive for congestion, post nasal drip.  Cardiovascular: negative  for chest pain or palpitations Respiratory: negative for hemoptysis, wheezing, shortness of breath, or cough. Positive for chest tightness.  Abdominal: negative for vomiting, diarrhea, or constipation. Positive for abdominal pain, nausea.  Dermatological: negative for rash Neurologic: negative for dizziness, or syncope. Positive for headache. All other systems reviewed and are otherwise negative with the exception to those above and in the HPI.  Physical Exam: Blood pressure 120/82, pulse 71, temperature 98.1 F (36.7 C), temperature source Oral, height 5' 4.25" (1.632 m), weight 288 lb (130.636 kg), SpO2 98 %., Body mass index is 49.05 kg/(m^2). General: Well developed, well nourished, in no acute distress. Morbidly obese Head: Normocephalic, atraumatic, eyes without discharge, sclera non-icteric, nares are without discharge. Bilateral auditory canals clear, TM's are without perforation, pearly grey and translucent with reflective cone of light bilaterally. Oral cavity moist, posterior pharynx without exudate, erythema, peritonsillar abscess, or post nasal drip.  Neck: Supple. No thyromegaly. Full ROM. No lymphadenopathy. Lungs: Clear bilaterally to auscultation without wheezes, rales, or rhonchi. Breathing is unlabored. Heart: RRR with S1 S2. No murmurs, rubs, or  gallops appreciated. Abdomen: Soft, non-tender, non-distended with normoactive bowel sounds. No hepatomegaly. No rebound/guarding. No obvious abdominal masses. Msk:  Strength and tone normal for age. Extremities/Skin: Warm and dry. No clubbing or cyanosis. No edema. No rashes or suspicious lesions. Neuro: Alert and oriented X 3. Moves all extremities spontaneously. Gait is normal. CNII-XII grossly in tact. Psych:  Responds to questions appropriately with a normal affect.    ASSESSMENT AND PLAN:  54 y.o. year old female with  This chart was scribed in my presence and reviewed by me personally.    ICD-9-CM ICD-10-CM   1. Abdominal pain, epigastric 789.06 R10.13 H. pylori breath test     sucralfate (CARAFATE) 1 GM/10ML suspension     Signed, [name], MD     By signing my name below, I, [name], attest that this documentation has been prepared under the direction and in the presence of [name], MD.  Royann Shivers Rifaie, Medical Scribe. 05/01/2015.  8:41 AM.     No diagnosis found.  Signed, [name], MD 05/01/2015 8:28 AM

## 2015-05-03 LAB — H. PYLORI BREATH TEST: H. pylori Breath Test: NOT DETECTED

## 2015-05-14 ENCOUNTER — Other Ambulatory Visit: Payer: Self-pay | Admitting: Gastroenterology

## 2015-05-14 DIAGNOSIS — K3184 Gastroparesis: Secondary | ICD-10-CM | POA: Diagnosis not present

## 2015-05-14 DIAGNOSIS — K449 Diaphragmatic hernia without obstruction or gangrene: Secondary | ICD-10-CM

## 2015-05-17 ENCOUNTER — Ambulatory Visit
Admission: RE | Admit: 2015-05-17 | Discharge: 2015-05-17 | Disposition: A | Discharge status: Home / Self Care | Payer: Medicare Other | Source: Ambulatory Visit | Attending: Gastroenterology | Admitting: Gastroenterology

## 2015-05-17 DIAGNOSIS — K449 Diaphragmatic hernia without obstruction or gangrene: Secondary | ICD-10-CM | POA: Diagnosis not present

## 2015-06-08 ENCOUNTER — Other Ambulatory Visit: Payer: Self-pay | Admitting: Gastroenterology

## 2015-06-08 DIAGNOSIS — R112 Nausea with vomiting, unspecified: Secondary | ICD-10-CM | POA: Diagnosis not present

## 2015-06-08 DIAGNOSIS — K3184 Gastroparesis: Secondary | ICD-10-CM | POA: Diagnosis not present

## 2015-06-08 DIAGNOSIS — R1013 Epigastric pain: Secondary | ICD-10-CM | POA: Diagnosis not present

## 2015-06-16 ENCOUNTER — Ambulatory Visit
Admission: RE | Admit: 2015-06-16 | Discharge: 2015-06-16 | Disposition: A | Discharge status: Home / Self Care | Payer: Medicare Other | Source: Ambulatory Visit | Attending: Gastroenterology | Admitting: Gastroenterology

## 2015-06-16 DIAGNOSIS — R1013 Epigastric pain: Secondary | ICD-10-CM

## 2015-06-16 MED ORDER — IOPAMIDOL (ISOVUE-300) INJECTION 61%
100.0000 mL | Freq: Once | INTRAVENOUS | Status: AC | PRN
  Administered 2015-06-16: 100 mL via INTRAVENOUS

## 2015-06-24 ENCOUNTER — Ambulatory Visit (INDEPENDENT_AMBULATORY_CARE_PROVIDER_SITE_OTHER): Payer: Medicare Other | Admitting: Family Medicine

## 2015-06-24 VITALS — BP 118/72 | HR 83 | Temp 98.2°F | Resp 18 | Ht 64.25 in | Wt 289.0 lb

## 2015-06-24 DIAGNOSIS — F411 Generalized anxiety disorder: Secondary | ICD-10-CM

## 2015-06-24 DIAGNOSIS — R21 Rash and other nonspecific skin eruption: Secondary | ICD-10-CM

## 2015-06-24 MED ORDER — CLOTRIMAZOLE-BETAMETHASONE 1-0.05 % EX CREA: 1.0000 "application " | TOPICAL_CREAM | Freq: Two times a day (BID) | CUTANEOUS | Status: DC

## 2015-06-24 MED ORDER — ALPRAZOLAM 1 MG PO TABS: 1.0000 mg | ORAL_TABLET | Freq: Three times a day (TID) | ORAL | Status: DC | PRN

## 2015-06-24 NOTE — Progress Notes (Signed)
By signing my name below, I, Moises Blood, attest that this documentation has been prepared under the direction and in the presence of [name], MD. Electronically Signed: Moises Blood, [place]. 06/24/2015 , 9:04 AM .  Patient was seen in room 10 .   Patient ID: Valerie [name] MRN: [id number], DOB: 09/13/1960, 55 y.o. Date of Encounter: 06/24/2015  Primary Physician: [name], MD  Chief Complaint:  Chief Complaint  Patient presents with  . Follow-up  . Medication Refill    xanax    HPI:  Valerie [name] is a 55 y.o. female who presents to [facility] for follow up on abd pain.  She will have surgery with Dr. Amedeo Plenty on February 15th. She notes that the botox injection helps with her gastroparesis. She hasn't seen nutritionist yet, and knows that her issue is portion control.   She's been having sweats in her palms and having rough skin areas. She's applied lotrisone cream to the area without relief. She needs a refill on lotrisone cream.  She also requests a refill on her xanax.   Past Medical History  Diagnosis Date  . Hypertension   . Anxiety   . Mental disorder   . Depression   . Sleep apnea   . Headache(784.0)   . Neuromuscular disorder ([place])   . 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 Fibromyalgia   . Arthritis     degenerative  . Anemia 05/01/2012  . Gastro-esophageal reflux   . Sleep apnea     CPAP-9  . Pancreatitis   . Diarrhea     EPISODIC  . Back pain     lower     Home Meds: Prior to Admission medications   Medication Sig Start Date End Date Taking? Authorizing Provider  albuterol (PROVENTIL HFA;VENTOLIN HFA) 108 (90 BASE) MCG/ACT inhaler Inhale 2 puffs into the lungs as needed for wheezing or shortness of breath. 03/04/15  Yes [name], MD  allopurinol (ZYLOPRIM) 300 MG tablet Take 0.5 tablets (150 mg total) by mouth daily. 04/01/15  Yes [name], MD  ALPRAZolam Duanne Moron) 1 MG tablet Take 1  tablet (1 mg total) by mouth 3 (three) times daily as needed for anxiety. 04/01/15  Yes [name], MD  baclofen (LIORESAL) 10 MG tablet TAKE 1 TABLET (10 MG TOTAL) BY MOUTH 3 (THREE TIMES DAILY) 02/06/15  Yes [name], MD  benazepril (LOTENSIN) 40 MG tablet TAKE 1 TABLET (40 MG TOTAL) BY MOUTH DAILY. 04/01/15  Yes [name], MD  Biotin 5000 MCG CAPS Take 10,000 mcg by mouth daily.    Yes Historical Provider, MD  cetirizine (ZYRTEC) 10 MG tablet Take 10 mg by mouth daily.   Yes Historical Provider, MD  Cholecalciferol (VITAMIN D) 2000 UNITS tablet Take 2,000 Units by mouth daily.   Yes Historical Provider, MD  clotrimazole-betamethasone (LOTRISONE) cream Apply 1 application topically 2 (two) times daily. 04/01/15  Yes [name], MD  fexofenadine (ALLEGRA) 180 MG tablet Take 180 mg by mouth daily.   Yes Historical Provider, MD  fluconazole (DIFLUCAN) 150 MG tablet Take 1 tablet (150 mg total) by mouth once. 03/04/15  Yes [name], MD  fluticasone (FLONASE) 50 MCG/ACT nasal spray Place 2 sprays into both nostrils daily. 09/30/14  Yes [name], MD  furosemide (LASIX) 20 MG tablet TAKE 1 TABLET BY MOUTH DAILY AS NEEDED 11/19/14  Yes [name], MD  HYDROcodone-acetaminophen [facility]) 10-325 MG per tablet Take 1-2 tablets  by mouth every 6 (six) hours as needed (pain.).   Yes Historical Provider, MD  ibuprofen (ADVIL,MOTRIN) 600 MG tablet Take 600 mg by mouth every 6 (six) hours as needed for mild pain.    Yes Historical Provider, MD  metoprolol succinate (TOPROL-XL) 100 MG 24 hr tablet Take 1 tablet (100 mg total) by mouth daily. Take with or immediately following a meal. 04/01/15  Yes [name], MD  modafinil (PROVIGIL) 200 MG tablet Take 0.5 tablets (100 mg total) by mouth 2 (two) times daily. 12/02/14  Yes Carmen Dohmeier, MD  montelukast (SINGULAIR) 10 MG tablet TAKE 1 TABLET (10 MG TOTAL) BY MOUTH AT BEDTIME 04/01/15  Yes [name], MD  morphine (MS  CONTIN) 30 MG 12 hr tablet Take 30 mg by mouth every 8 (eight) hours. Not from [facility] 09/23/12  Yes Historical Provider, MD  Multiple Vitamins-Minerals (MULTIVITAMIN PO) Take 1 tablet by mouth daily.   Yes Historical Provider, MD  Pancrelipase, Lip-Prot-Amyl, (CREON) 24000 UNITS CPEP Take 48,000 Units by mouth 4 (four) times daily as needed (for pancreatitis).   Yes Historical Provider, MD  PATADAY 0.2 % SOLN APPLY 1 DROP TO EYE 2 (TWO) TIMES DAILY. 06/11/14  Yes [name], MD  promethazine (PHENERGAN) 25 MG tablet Take 1 tablet (25 mg total) by mouth every 6 (six) hours as needed for nausea or vomiting. 12/27/14  Yes [name], MD  spironolactone (ALDACTONE) 100 MG tablet Take 1 tablet (100 mg total) by mouth daily. 04/01/15  Yes [name], MD  sucralfate (CARAFATE) 1 GM/10ML suspension Take 10 mLs (1 g total) by mouth 4 (four) times daily -  with meals and at bedtime. 05/01/15  Yes [name], MD    Allergies:  Allergies  Allergen Reactions  . Dilaudid [Hydromorphone Hcl] Other (See Comments)    Headache   . Pregabalin   . Metoclopramide Hcl Other (See Comments)    "crawling inside"  . Neurontin [Gabapentin] Other (See Comments)    Tremors   . Other Other (See Comments)    Avoid all SSRI's and SNRI's- damaged pancreas  . Reglan [Metoclopramide]     Social History   Social History  . Marital Status: Single    Spouse Name: N/A  . Number of Children: 0  . Years of Education: College   Occupational History  . disabled    Social History Main Topics  . Smoking status: Never Smoker   . Smokeless tobacco: Never Used  . Alcohol Use: No  . Drug Use: No  . Sexual Activity: Yes   Other Topics Concern  . Not on file   Social History Narrative   Caffeine 2 cups iced tea daily avg.     Review of Systems: Constitutional: negative for fever, chills, night sweats, weight changes, or fatigue  HEENT: negative for vision changes, hearing loss, congestion, rhinorrhea,  ST, epistaxis, or sinus pressure Cardiovascular: negative for chest pain or palpitations Respiratory: negative for hemoptysis, wheezing, shortness of breath, or cough Abdominal: negative for nausea, vomiting, diarrhea, or constipation; positive for abd pain Dermatological: negative for rash, positive for dry skin (palms) Neurologic: negative for headache, dizziness, or syncope All other systems reviewed and are otherwise negative with the exception to those above and in the HPI.  Physical Exam: Blood pressure 118/72, pulse 83, temperature 98.2 F (36.8 C), temperature source Oral, resp. rate 18, height 5' 4.25" (1.632 m), weight 289 lb (131.09 kg), SpO2 97 %., Body mass index is 49.22 kg/(m^2). General: Well developed, well  nourished, in no acute distress. Head: Normocephalic, atraumatic, eyes without discharge, sclera non-icteric, nares are without discharge. Bilateral auditory canals clear, TM's are without perforation, pearly grey and translucent with reflective cone of light bilaterally. Oral cavity moist, posterior pharynx without exudate, erythema, peritonsillar abscess, or post nasal drip.  Neck: Supple. No thyromegaly. Full ROM. No lymphadenopathy. Lungs: Clear bilaterally to auscultation without wheezes, rales, or rhonchi. Breathing is unlabored. Heart: RRR with S1 S2. No murmurs, rubs, or gallops appreciated. Abdomen: Soft, non-tender, non-distended with normoactive bowel sounds. No hepatomegaly. No rebound/guarding. No obvious abdominal masses. Deep induration over epigastrium Msk:  Strength and tone normal for age. Extremities/Skin: Warm and dry. No clubbing or cyanosis. No edema. No rashes or suspicious lesions. Eczematous changes over her palms that are superficial and very fine Neuro: Alert and oriented X 3. Moves all extremities spontaneously. Gait is normal. CNII-XII grossly in tact. Psych:  Responds to questions appropriately with a normal affect.    ASSESSMENT AND PLAN:  55  y.o. year old female with  This chart was scribed in my presence and reviewed by me personally.    ICD-9-CM ICD-10-CM   1. Anxiety state 300.00 F41.1 ALPRAZolam (XANAX) 1 MG tablet  2. Rash of hands 782.1 R21 clotrimazole-betamethasone (LOTRISONE) cream   I suspect patient has a mild form of pancreatitis. I have explained to her carefully that she does need to lose weight and have offered a nutrition consult. She's got talk to Dr. Amedeo Plenty before she takes further action on this regard. She expects to have an EGD done in the near future.    Signed, [name], MD 06/24/2015 9:07 AM

## 2015-06-28 ENCOUNTER — Telehealth: Payer: Self-pay

## 2015-06-28 DIAGNOSIS — M542 Cervicalgia: Secondary | ICD-10-CM

## 2015-06-28 NOTE — Telephone Encounter (Signed)
Pt is requesting a physical therapy referral to be sent to Dr. Belia Heman. She is requesting that the referral be for "as needed" visits; she has reoccurring neck pain.

## 2015-06-28 NOTE — Telephone Encounter (Signed)
Ok to refer PRN

## 2015-06-29 NOTE — Telephone Encounter (Signed)
Done

## 2015-07-01 DIAGNOSIS — M542 Cervicalgia: Secondary | ICD-10-CM | POA: Diagnosis not present

## 2015-07-05 ENCOUNTER — Other Ambulatory Visit: Payer: Self-pay

## 2015-07-05 MED ORDER — OLOPATADINE HCL 0.2 % OP SOLN: OPHTHALMIC | Status: DC

## 2015-07-05 NOTE — Telephone Encounter (Signed)
Pharm reqs Rf of [place] Dr Carlean Jews, orig Rxd in 2014 for Sxs from allergies w/a year of RFs. Pt was seen recently for allergies so I will RF again.

## 2015-07-07 ENCOUNTER — Encounter (HOSPITAL_COMMUNITY): Payer: Self-pay | Admitting: *Deleted

## 2015-07-07 ENCOUNTER — Emergency Department (HOSPITAL_COMMUNITY)
Admission: EM | Admit: 2015-07-07 | Discharge: 2015-07-07 | Disposition: A | Discharge status: Home / Self Care | Payer: Medicare Other | Attending: Emergency Medicine | Admitting: Emergency Medicine

## 2015-07-07 DIAGNOSIS — Z9981 Dependence on supplemental oxygen: Secondary | ICD-10-CM | POA: Diagnosis not present

## 2015-07-07 DIAGNOSIS — G4730 Sleep apnea, unspecified: Secondary | ICD-10-CM | POA: Insufficient documentation

## 2015-07-07 DIAGNOSIS — M1990 Unspecified osteoarthritis, unspecified site: Secondary | ICD-10-CM | POA: Diagnosis not present

## 2015-07-07 DIAGNOSIS — F329 Major depressive disorder, single episode, unspecified: Secondary | ICD-10-CM | POA: Diagnosis not present

## 2015-07-07 DIAGNOSIS — F419 Anxiety disorder, unspecified: Secondary | ICD-10-CM | POA: Diagnosis not present

## 2015-07-07 DIAGNOSIS — Z7951 Long term (current) use of inhaled steroids: Secondary | ICD-10-CM | POA: Diagnosis not present

## 2015-07-07 DIAGNOSIS — I10 Essential (primary) hypertension: Secondary | ICD-10-CM | POA: Insufficient documentation

## 2015-07-07 DIAGNOSIS — Z862 Personal history of diseases of the blood and blood-forming organs and certain disorders involving the immune mechanism: Secondary | ICD-10-CM | POA: Diagnosis not present

## 2015-07-07 DIAGNOSIS — R1310 Dysphagia, unspecified: Secondary | ICD-10-CM

## 2015-07-07 DIAGNOSIS — K219 Gastro-esophageal reflux disease without esophagitis: Secondary | ICD-10-CM | POA: Insufficient documentation

## 2015-07-07 DIAGNOSIS — Z79899 Other long term (current) drug therapy: Secondary | ICD-10-CM | POA: Diagnosis not present

## 2015-07-07 DIAGNOSIS — Z7952 Long term (current) use of systemic steroids: Secondary | ICD-10-CM | POA: Diagnosis not present

## 2015-07-07 LAB — BASIC METABOLIC PANEL
Anion gap: 9 (ref 5–15)
BUN: 16 mg/dL (ref 6–20)
CHLORIDE: 104 mmol/L (ref 101–111)
CO2: 26 mmol/L (ref 22–32)
CREATININE: 1.2 mg/dL — AB (ref 0.44–1.00)
Calcium: 10 mg/dL (ref 8.9–10.3)
GFR, EST AFRICAN AMERICAN: 59 mL/min — AB (ref >60)
GFR, EST NON AFRICAN AMERICAN: 51 mL/min — AB (ref >60)
Glucose, Bld: 112 mg/dL — ABNORMAL HIGH (ref 65–99)
POTASSIUM: 4.3 mmol/L (ref 3.5–5.1)
SODIUM: 139 mmol/L (ref 135–145)

## 2015-07-07 LAB — CBC WITH DIFFERENTIAL/PLATELET
BASOS PCT: 0 %
Basophils Absolute: 0 10*3/uL (ref 0.0–0.1)
EOS ABS: 0.2 10*3/uL (ref 0.0–0.7)
EOS PCT: 2 %
HCT: 40.3 % (ref 36.0–46.0)
HEMOGLOBIN: 13.2 g/dL (ref 12.0–15.0)
LYMPHS ABS: 2.4 10*3/uL (ref 0.7–4.0)
Lymphocytes Relative: 32 %
MCH: 29.9 pg (ref 26.0–34.0)
MCHC: 32.8 g/dL (ref 30.0–36.0)
MCV: 91.4 fL (ref 78.0–100.0)
Monocytes Absolute: 0.6 10*3/uL (ref 0.1–1.0)
Monocytes Relative: 7 %
NEUTROS PCT: 59 %
Neutro Abs: 4.3 10*3/uL (ref 1.7–7.7)
PLATELETS: 274 10*3/uL (ref 150–400)
RBC: 4.41 MIL/uL (ref 3.87–5.11)
RDW: 13.4 % (ref 11.5–15.5)
WBC: 7.5 10*3/uL (ref 4.0–10.5)

## 2015-07-07 NOTE — ED Notes (Signed)
The pt has a feeling of her food stopping mid chest down it goes through after awhile  She has had this for 3 days.  Liquids and food go down but takes a while to go through to the stomach.  She talked to dr Amedeo Plenty in the office here for check-up

## 2015-07-07 NOTE — Discharge Instructions (Signed)

## 2015-07-07 NOTE — ED Notes (Addendum)
Pt reports that she was instructed to come to hospital for scheduled endoscopy. This RN unable to obtain any info from registration about an appt today for endoscopy by Dr. Watt Climes. No notes located in chart, secretary trying to page MD on call for clarification.

## 2015-07-07 NOTE — ED Notes (Signed)
Pt refusing to get in gown until Dr. Watt Climes can be contacted.

## 2015-07-07 NOTE — ED Provider Notes (Signed)
CSN: [id number]     Arrival date & time 07/07/15  1619 History   First MD Initiated Contact with Patient 07/07/15 1807     Chief Complaint  Patient presents with  . Dysphagia    HPI The patient presented to the emergency room for issues with difficulty swallowing solids and liquids. Patient has been having trouble with this for at least last 3 days. It is noticed that when she tries to swallow liquids and food seems to get stuck midway down. After a little while food seems to progress down into the stomach. She has not had any difficulty with handling her secretions. She has not had any vomiting. The patient called her GI doctor today and spoke with Someone at the front office and was told that she should come to the emergency room.  Pt thought she was going to see Dr Watt Climes to have an endoscopy performed.   Past Medical History  Diagnosis Date  . Hypertension   . Anxiety   . Mental disorder   . Depression   . Sleep apnea   . Headache(784.0)   . Neuromuscular disorder ([place])   . 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 Fibromyalgia   . Arthritis     degenerative  . Anemia 05/01/2012  . Gastro-esophageal reflux   . Sleep apnea     CPAP-9  . Pancreatitis   . Diarrhea     EPISODIC  . Back pain     lower   Past Surgical History  Procedure Laterality Date  . Cholecystectomy    . Rotator cuff repair  bilateral  . Nasal septoplasty w/ turbinoplasty    . Esophagogastroduodenoscopy  12/21/2011    Procedure: ESOPHAGOGASTRODUODENOSCOPY (EGD);  Surgeon: Missy Sabins, MD;  [facility] ENDOSCOPY;  Service: Endoscopy;  Laterality: N/A;  . Gallbladder surgery    . Flexible sigmoidoscopy  05/01/2012    Procedure: FLEXIBLE SIGMOIDOSCOPY;  Surgeon: Jeryl Columbia, MD;  Location: [facility] ENDOSCOPY;  Service: Endoscopy;  Laterality: N/A;  unprepped  . Biceps tendon repair      13  . Nasal sinus surgery    . Esophagogastroduodenoscopy N/A 11/06/2012    Procedure: ESOPHAGOGASTRODUODENOSCOPY (EGD);   Surgeon: Missy Sabins, MD;  Location: [facility] ENDOSCOPY;  Service: Endoscopy;  Laterality: N/A;  . Botox injection N/A 11/06/2012    Procedure: BOTOX INJECTION;  Surgeon: Missy Sabins, MD;  Location: [facility] ENDOSCOPY;  Service: Endoscopy;  Laterality: N/A;  . Esophagogastroduodenoscopy N/A 12/04/2013    Procedure: ESOPHAGOGASTRODUODENOSCOPY (EGD);  Surgeon: Missy Sabins, MD;  Location: [facility] ENDOSCOPY;  Service: Endoscopy;  Laterality: N/A;  . Botox injection N/A 12/04/2013    Procedure: BOTOX INJECTION;  Surgeon: Missy Sabins, MD;  Location: [place];  Service: Endoscopy;  Laterality: N/A;  . Esophagogastroduodenoscopy N/A 02/26/2014    Procedure: ESOPHAGOGASTRODUODENOSCOPY (EGD);  Surgeon: Missy Sabins, MD;  Location: [facility] ENDOSCOPY;  Service: Endoscopy;  Laterality: N/A;  . Botox injection N/A 02/26/2014    Procedure: BOTOX INJECTION;  Surgeon: Missy Sabins, MD;  Location: [place];  Service: Endoscopy;  Laterality: N/A;   Family History  Problem Relation Age of Onset  . Heart disease Mother   . COPD Mother   . Arthritis Mother   . Cancer Mother   . Hypertension Mother   . Arthritis Paternal Grandmother   . Heart disease Paternal Grandmother   . Heart disease Paternal Grandfather    Social History  Substance Use Topics  .  Smoking status: Never Smoker   . Smokeless tobacco: Never Used  . Alcohol Use: No   OB History    No data available     Review of Systems  All other systems reviewed and are negative.     Allergies  Dilaudid; Pregabalin; Metoclopramide hcl; Neurontin; Other; and Reglan  Home Medications   Prior to Admission medications   Medication Sig Start Date End Date Taking? Authorizing Provider  albuterol (PROVENTIL HFA;VENTOLIN HFA) 108 (90 BASE) MCG/ACT inhaler Inhale 2 puffs into the lungs as needed for wheezing or shortness of breath. 03/04/15   [name], MD  allopurinol (ZYLOPRIM) 300 MG tablet Take 0.5 tablets (150 mg total) by mouth daily. 04/01/15   [name], MD  ALPRAZolam Duanne Moron) 1 MG tablet Take 1 tablet (1 mg total) by mouth 3 (three) times daily as needed for anxiety. 06/24/15   [name], MD  baclofen (LIORESAL) 10 MG tablet TAKE 1 TABLET (10 MG TOTAL) BY MOUTH 3 (THREE TIMES DAILY) 02/06/15   [name], MD  benazepril (LOTENSIN) 40 MG tablet TAKE 1 TABLET (40 MG TOTAL) BY MOUTH DAILY. 04/01/15   [name], MD  Biotin 5000 MCG CAPS Take 10,000 mcg by mouth daily.     Historical Provider, MD  cetirizine (ZYRTEC) 10 MG tablet Take 10 mg by mouth daily.    Historical Provider, MD  Cholecalciferol (VITAMIN D) 2000 UNITS tablet Take 2,000 Units by mouth daily.    Historical Provider, MD  clotrimazole-betamethasone (LOTRISONE) cream Apply 1 application topically 2 (two) times daily. 06/24/15   [name], MD  fexofenadine (ALLEGRA) 180 MG tablet Take 180 mg by mouth daily.    Historical Provider, MD  fluconazole (DIFLUCAN) 150 MG tablet Take 1 tablet (150 mg total) by mouth once. 03/04/15   [name], MD  fluticasone (FLONASE) 50 MCG/ACT nasal spray Place 2 sprays into both nostrils daily. 09/30/14   [name], MD  furosemide (LASIX) 20 MG tablet TAKE 1 TABLET BY MOUTH DAILY AS NEEDED 11/19/14   [name], MD  HYDROcodone-acetaminophen [facility]) 10-325 MG per tablet Take 1-2 tablets by mouth every 6 (six) hours as needed (pain.).    Historical Provider, MD  ibuprofen (ADVIL,MOTRIN) 600 MG tablet Take 600 mg by mouth every 6 (six) hours as needed for mild pain.     Historical Provider, MD  metoprolol succinate (TOPROL-XL) 100 MG 24 hr tablet Take 1 tablet (100 mg total) by mouth daily. Take with or immediately following a meal. 04/01/15   [name], MD  modafinil (PROVIGIL) 200 MG tablet Take 0.5 tablets (100 mg total) by mouth 2 (two) times daily. 12/02/14   Carmen Dohmeier, MD  montelukast (SINGULAIR) 10 MG tablet TAKE 1 TABLET (10 MG TOTAL) BY MOUTH AT BEDTIME 04/01/15   [name], MD  morphine (MS  CONTIN) 30 MG 12 hr tablet Take 30 mg by mouth every 8 (eight) hours. Not from [facility] 09/23/12   Historical Provider, MD  Multiple Vitamins-Minerals (MULTIVITAMIN PO) Take 1 tablet by mouth daily.    Historical Provider, MD  Olopatadine HCl (PATADAY) 0.2 % SOLN APPLY 1 DROP TO EYE 2 (TWO) TIMES DAILY. 07/05/15   [name], MD  Pancrelipase, Lip-Prot-Amyl, (CREON) 24000 UNITS CPEP Take 48,000 Units by mouth 4 (four) times daily as needed (for pancreatitis).    Historical Provider, MD  promethazine (PHENERGAN) 25 MG tablet Take 1 tablet (25 mg total) by mouth every 6 (six) hours as needed for nausea or vomiting. 12/27/14  [name], MD  spironolactone (ALDACTONE) 100 MG tablet Take 1 tablet (100 mg total) by mouth daily. 04/01/15   [name], MD  sucralfate (CARAFATE) 1 GM/10ML suspension Take 10 mLs (1 g total) by mouth 4 (four) times daily -  with meals and at bedtime. 05/01/15   [name], MD   BP 121/51 mmHg  Temp(Src) 99.1 F (37.3 C) (Oral)  Resp 18  Ht 5\' 4"  (1.626 m)  Wt 130.182 kg  BMI 49.24 kg/m2  SpO2 96% Physical Exam  Constitutional: She appears well-developed and well-nourished. No distress.  HENT:  Head: Normocephalic and atraumatic.  Right Ear: External ear normal.  Left Ear: External ear normal.  Eyes: Conjunctivae are normal. Right eye exhibits no discharge. Left eye exhibits no discharge. No scleral icterus.  Neck: Neck supple. No tracheal deviation present.  Cardiovascular: Normal rate.   Pulmonary/Chest: Effort normal. No stridor. No respiratory distress.  Musculoskeletal: She exhibits no edema.  Neurological: She is alert. Cranial nerve deficit: no gross deficits.  Skin: Skin is warm and dry. No rash noted.  Psychiatric: She has a normal mood and affect.  Nursing note and vitals reviewed.   ED Course  Procedures (including critical care time) Labs Review Labs Reviewed  BASIC METABOLIC PANEL - Abnormal; Notable for the following:    Glucose,  Bld 112 (*)    Creatinine, Ser 1.20 (*)    GFR calc non Af Amer 51 (*)    GFR calc Af Amer 59 (*)    All other components within normal limits  CBC WITH DIFFERENTIAL/PLATELET      MDM   Final diagnoses:  Dysphagia    D/w Dr Watt Climes.  The office sent pt to the ED because she may have a food impaction.  Dr. Daisey Must was not planning on seeing the patient in the emergency room unless we felt she needed to have endoscopy for esophageal food impaction.  Not just for a stricture.  She does have an appointment with Dr Amedeo Plenty tomorrow.  I explained this to the patient.  She is very upset.  She understood that she was coming her to see Dr Watt Climes for the procedure.  She knew she did not have a complete blockage and had told the GI staff she could wait until tomorrow.   Pt is not having any issues with drooling.  She is handling her secretion.  At this time there does not appear to be any evidence of an acute emergency medical condition and the patient appears stable for discharge with appropriate outpatient follow up.     Dorie Rank, MD 07/10/15 [phone]

## 2015-07-07 NOTE — ED Notes (Signed)
Pt upset and tearful that she will not have Endoscopy done today, informed pt of what Dr. Daisey Must told Dr. Tomi Bamberger and pt tearful.

## 2015-07-08 ENCOUNTER — Other Ambulatory Visit: Payer: Self-pay | Admitting: Gastroenterology

## 2015-07-08 ENCOUNTER — Encounter (HOSPITAL_COMMUNITY): Payer: Self-pay

## 2015-07-08 ENCOUNTER — Ambulatory Visit (HOSPITAL_COMMUNITY): Payer: Medicare Other | Admitting: Anesthesiology

## 2015-07-08 ENCOUNTER — Encounter (HOSPITAL_COMMUNITY)
Admission: RE | Disposition: A | Discharge status: Home / Self Care | Payer: Medicare Other | Source: Ambulatory Visit | Attending: Gastroenterology

## 2015-07-08 ENCOUNTER — Ambulatory Visit (HOSPITAL_COMMUNITY)
Admission: RE | Admit: 2015-07-08 | Discharge: 2015-07-08 | Disposition: A | Discharge status: Home / Self Care | Payer: Medicare Other | Source: Ambulatory Visit | Attending: Gastroenterology | Admitting: Gastroenterology

## 2015-07-08 DIAGNOSIS — E669 Obesity, unspecified: Secondary | ICD-10-CM | POA: Diagnosis not present

## 2015-07-08 DIAGNOSIS — I10 Essential (primary) hypertension: Secondary | ICD-10-CM | POA: Insufficient documentation

## 2015-07-08 DIAGNOSIS — Z79899 Other long term (current) drug therapy: Secondary | ICD-10-CM | POA: Insufficient documentation

## 2015-07-08 DIAGNOSIS — F419 Anxiety disorder, unspecified: Secondary | ICD-10-CM | POA: Diagnosis not present

## 2015-07-08 DIAGNOSIS — M797 Fibromyalgia: Secondary | ICD-10-CM | POA: Insufficient documentation

## 2015-07-08 DIAGNOSIS — R109 Unspecified abdominal pain: Secondary | ICD-10-CM | POA: Diagnosis present

## 2015-07-08 DIAGNOSIS — Z7951 Long term (current) use of inhaled steroids: Secondary | ICD-10-CM | POA: Diagnosis not present

## 2015-07-08 DIAGNOSIS — G709 Myoneural disorder, unspecified: Secondary | ICD-10-CM | POA: Insufficient documentation

## 2015-07-08 DIAGNOSIS — Z79891 Long term (current) use of opiate analgesic: Secondary | ICD-10-CM | POA: Diagnosis not present

## 2015-07-08 DIAGNOSIS — Z9989 Dependence on other enabling machines and devices: Secondary | ICD-10-CM | POA: Insufficient documentation

## 2015-07-08 DIAGNOSIS — K219 Gastro-esophageal reflux disease without esophagitis: Secondary | ICD-10-CM | POA: Insufficient documentation

## 2015-07-08 DIAGNOSIS — R112 Nausea with vomiting, unspecified: Secondary | ICD-10-CM | POA: Diagnosis not present

## 2015-07-08 DIAGNOSIS — R1013 Epigastric pain: Secondary | ICD-10-CM | POA: Diagnosis not present

## 2015-07-08 DIAGNOSIS — R6881 Early satiety: Secondary | ICD-10-CM | POA: Diagnosis not present

## 2015-07-08 DIAGNOSIS — K3184 Gastroparesis: Secondary | ICD-10-CM | POA: Diagnosis not present

## 2015-07-08 DIAGNOSIS — M1990 Unspecified osteoarthritis, unspecified site: Secondary | ICD-10-CM | POA: Insufficient documentation

## 2015-07-08 DIAGNOSIS — R1310 Dysphagia, unspecified: Secondary | ICD-10-CM | POA: Insufficient documentation

## 2015-07-08 DIAGNOSIS — G4730 Sleep apnea, unspecified: Secondary | ICD-10-CM | POA: Diagnosis not present

## 2015-07-08 DIAGNOSIS — Z6841 Body Mass Index (BMI) 40.0 and over, adult: Secondary | ICD-10-CM | POA: Insufficient documentation

## 2015-07-08 DIAGNOSIS — Z791 Long term (current) use of non-steroidal anti-inflammatories (NSAID): Secondary | ICD-10-CM | POA: Diagnosis not present

## 2015-07-08 HISTORY — PX: BOTOX INJECTION: SHX5754

## 2015-07-08 HISTORY — PX: ESOPHAGOGASTRODUODENOSCOPY (EGD) WITH PROPOFOL: SHX5813

## 2015-07-08 SURGERY — ESOPHAGOGASTRODUODENOSCOPY (EGD) WITH PROPOFOL
Anesthesia: Monitor Anesthesia Care

## 2015-07-08 MED ORDER — LIDOCAINE HCL (CARDIAC) 20 MG/ML IV SOLN
INTRAVENOUS | Status: DC | PRN
  Administered 2015-07-08: 50 mg via INTRAVENOUS

## 2015-07-08 MED ORDER — LACTATED RINGERS IV SOLN
INTRAVENOUS | Status: DC | PRN
  Administered 2015-07-08: 13:00:00 via INTRAVENOUS

## 2015-07-08 MED ORDER — LIDOCAINE HCL (CARDIAC) 20 MG/ML IV SOLN
INTRAVENOUS | Status: AC
  Filled 2015-07-08: qty 5

## 2015-07-08 MED ORDER — PROPOFOL 10 MG/ML IV BOLUS
INTRAVENOUS | Status: DC | PRN
  Administered 2015-07-08 (×4): 50 mg via INTRAVENOUS

## 2015-07-08 MED ORDER — PROPOFOL 10 MG/ML IV BOLUS
INTRAVENOUS | Status: AC
  Filled 2015-07-08: qty 20

## 2015-07-08 MED ORDER — SODIUM CHLORIDE 0.9 % IJ SOLN
100.0000 [IU] | Freq: Once | INTRAMUSCULAR | Status: DC
  Filled 2015-07-08: qty 100

## 2015-07-08 SURGICAL SUPPLY — 14 items

## 2015-07-08 NOTE — Op Note (Signed)
[facility] [place], [address]   ENDOSCOPY PROCEDURE REPORT  PATIENT: Valerie, [name]  MR#: [id number] BIRTHDATE: 11/23/1960 , 26  yrs. old GENDER: female ENDOSCOPIST: Teena Irani, MD REFERRED BY: PROCEDURE DATE:  07/15/2015 PROCEDURE: ASA CLASS: INDICATIONS:    dysphagia and early satiety. MEDICATIONS: propofol TOPICAL ANESTHETIC: one  DESCRIPTION OF PROCEDURE: After the risks benefits and alternatives of the procedure were thoroughly explained, informed consent was obtained.  The Pentax Gastroscope [id number] endoscope was introduced through the mouth and advanced to the second portion of the duodenum , Without limitations.  The instrument was slowly withdrawn as the mucosa was fully examined. Estimated blood loss is zero unless otherwise noted in this procedure report.    esophagus: Normal throughout, no foreign body ulcer erosion or inflammation or stricture.       retroflexion was normal Stomach.Mild prepyloric erythema. Pylorus patent. 25 units of Botox injected in each of 4 quadrants.Duodenum: Normal          The scope was then withdrawn from the patient and the procedure completed.  COMPLICATIONS: There were no immediate complications.  ENDOSCOPIC IMPRESSION: basically normal study, specifically no esophageal abnormalities. Status post Botox injection of the pylorus for gastroparesis.   RECOMMENDATIONS: advance diet and observe response to Botox injection.  REPEAT EXAM:  eSigned:  Teena Irani, MD 2015-07-15 12:57 PM    CC:  CPT CODES: ICD CODES:  The ICD and CPT codes recommended by this software are interpretations from the data that the clinical staff has captured with the software.  The verification of the translation of this report to the ICD and CPT codes and modifiers is the sole responsibility of the health care institution and practicing physician where this report was generated.  [place].  will not be held responsible for the validity of the ICD and CPT codes included on this report.  AMA assumes no liability for data contained or not contained herein. CPT is a Designer, television/film set of the [employer].  PATIENT NAME:  Valerie, [name] MR#: [id number]

## 2015-07-08 NOTE — Anesthesia Postprocedure Evaluation (Signed)
Anesthesia Post Note  Patient: CURRIE BRUNGARD  Procedure(s) Performed: Procedure(s) (LRB): ESOPHAGOGASTRODUODENOSCOPY (EGD) WITH PROPOFOL (N/A) BOTOX INJECTION (N/A)  Patient location during evaluation: PACU Anesthesia Type: MAC Level of consciousness: awake and alert Pain management: pain level controlled Vital Signs Assessment: post-procedure vital signs reviewed and stable Respiratory status: spontaneous breathing, nonlabored ventilation, respiratory function stable and patient connected to nasal cannula oxygen Cardiovascular status: stable and blood pressure returned to baseline Anesthetic complications: no    Last Vitals:  Filed Vitals:   07/08/15 1320 07/08/15 1330  BP: 109/76 119/69  Pulse: 55 57  Temp:    Resp: 13 14    Last Pain:  Filed Vitals:   07/08/15 1344  PainSc: 6                  Jovana Rembold J

## 2015-07-08 NOTE — Anesthesia Preprocedure Evaluation (Addendum)
Anesthesia Evaluation  Patient identified by MRN, date of birth, ID band Patient awake    Reviewed: Allergy & Precautions, NPO status , Patient's Chart, lab work & pertinent test results  Airway Mallampati: II   Neck ROM: Full    Dental  (+) Dental Advisory Given, Teeth Intact   Pulmonary sleep apnea and Continuous Positive Airway Pressure Ventilation ,    breath sounds clear to auscultation       Cardiovascular hypertension, Pt. on medications negative cardio ROS   Rhythm:Regular     Neuro/Psych Anxiety Depression PTSDnegative neurological ROS     GI/Hepatic Neg liver ROS, GERD  ,  Endo/Other  negative endocrine ROSMorbid obesity  Renal/GU negative Renal ROS  negative genitourinary   Musculoskeletal negative musculoskeletal ROS (+) Fibromyalgia -, narcotic dependent  Abdominal (+) + obese,   Peds negative pediatric ROS (+)  Hematology negative hematology ROS (+)   Anesthesia Other Findings   Reproductive/Obstetrics negative OB ROS                          Anesthesia Physical Anesthesia Plan  ASA: III  Anesthesia Plan: MAC   Post-op Pain Management:    Induction: Intravenous  Airway Management Planned: Nasal Cannula  Additional Equipment:   Intra-op Plan:   Post-operative Plan:   Informed Consent: I have reviewed the patients History and Physical, chart, labs and discussed the procedure including the risks, benefits and alternatives for the proposed anesthesia with the patient or authorized representative who has indicated his/her understanding and acceptance.     Plan Discussed with:   Anesthesia Plan Comments:         Anesthesia Quick Evaluation

## 2015-07-08 NOTE — Interval H&P Note (Signed)
History and Physical Interval Note:  07/08/2015 12:32 PM  Valerie [name]  has presented today for surgery, with the diagnosis of gastroparesis/pain  The various methods of treatment have been discussed with the patient and family. After consideration of risks, benefits and other options for treatment, the patient has consented to  Procedure(s): ESOPHAGOGASTRODUODENOSCOPY (EGD) WITH PROPOFOL (N/A) BOTOX INJECTION (N/A) as a surgical intervention .  The patient's history has been reviewed, patient examined, no change in status, stable for surgery.  I have reviewed the patient's chart and labs.  Questions were answered to the patient's satisfaction.     [name]

## 2015-07-08 NOTE — Addendum Note (Signed)
Addended by: Teena Irani on: 07/08/2015 01:08 PM   Modules accepted: Orders

## 2015-07-08 NOTE — H&P (View-Only) (Signed)
By signing my name below, I, Moises Blood, attest that this documentation has been prepared under the direction and in the presence of [name], MD. Electronically Signed: Moises Blood, [place]. 06/24/2015 , 9:04 AM .  Patient was seen in room 10 .   Patient ID: Valerie [name] MRN: [id number], DOB: May 07, 1961, 55 y.o. Date of Encounter: 06/24/2015  Primary Physician: [name], MD  Chief Complaint:  Chief Complaint  Patient presents with  . Follow-up  . Medication Refill    xanax    HPI:  Valerie [name] is a 55 y.o. female who presents to [facility] for follow up on abd pain.  She will have surgery with Dr. Amedeo Plenty on February 15th. She notes that the botox injection helps with her gastroparesis. She hasn't seen nutritionist yet, and knows that her issue is portion control.   She's been having sweats in her palms and having rough skin areas. She's applied lotrisone cream to the area without relief. She needs a refill on lotrisone cream.  She also requests a refill on her xanax.   Past Medical History  Diagnosis Date  . Hypertension   . Anxiety   . Mental disorder   . Depression   . Sleep apnea   . Headache(784.0)   . Neuromuscular disorder ([place])   . 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 Fibromyalgia   . Arthritis     degenerative  . Anemia 05/01/2012  . Gastro-esophageal reflux   . Sleep apnea     CPAP-9  . Pancreatitis   . Diarrhea     EPISODIC  . Back pain     lower     Home Meds: Prior to Admission medications   Medication Sig Start Date End Date Taking? Authorizing Provider  albuterol (PROVENTIL HFA;VENTOLIN HFA) 108 (90 BASE) MCG/ACT inhaler Inhale 2 puffs into the lungs as needed for wheezing or shortness of breath. 03/04/15  Yes [name], MD  allopurinol (ZYLOPRIM) 300 MG tablet Take 0.5 tablets (150 mg total) by mouth daily. 04/01/15  Yes [name], MD  ALPRAZolam Duanne Moron) 1 MG tablet Take 1  tablet (1 mg total) by mouth 3 (three) times daily as needed for anxiety. 04/01/15  Yes [name], MD  baclofen (LIORESAL) 10 MG tablet TAKE 1 TABLET (10 MG TOTAL) BY MOUTH 3 (THREE TIMES DAILY) 02/06/15  Yes [name], MD  benazepril (LOTENSIN) 40 MG tablet TAKE 1 TABLET (40 MG TOTAL) BY MOUTH DAILY. 04/01/15  Yes [name], MD  Biotin 5000 MCG CAPS Take 10,000 mcg by mouth daily.    Yes Historical Provider, MD  cetirizine (ZYRTEC) 10 MG tablet Take 10 mg by mouth daily.   Yes Historical Provider, MD  Cholecalciferol (VITAMIN D) 2000 UNITS tablet Take 2,000 Units by mouth daily.   Yes Historical Provider, MD  clotrimazole-betamethasone (LOTRISONE) cream Apply 1 application topically 2 (two) times daily. 04/01/15  Yes [name], MD  fexofenadine (ALLEGRA) 180 MG tablet Take 180 mg by mouth daily.   Yes Historical Provider, MD  fluconazole (DIFLUCAN) 150 MG tablet Take 1 tablet (150 mg total) by mouth once. 03/04/15  Yes [name], MD  fluticasone (FLONASE) 50 MCG/ACT nasal spray Place 2 sprays into both nostrils daily. 09/30/14  Yes [name], MD  furosemide (LASIX) 20 MG tablet TAKE 1 TABLET BY MOUTH DAILY AS NEEDED 11/19/14  Yes [name], MD  HYDROcodone-acetaminophen [facility]) 10-325 MG per tablet Take 1-2 tablets  by mouth every 6 (six) hours as needed (pain.).   Yes Historical Provider, MD  ibuprofen (ADVIL,MOTRIN) 600 MG tablet Take 600 mg by mouth every 6 (six) hours as needed for mild pain.    Yes Historical Provider, MD  metoprolol succinate (TOPROL-XL) 100 MG 24 hr tablet Take 1 tablet (100 mg total) by mouth daily. Take with or immediately following a meal. 04/01/15  Yes [name], MD  modafinil (PROVIGIL) 200 MG tablet Take 0.5 tablets (100 mg total) by mouth 2 (two) times daily. 12/02/14  Yes Carmen Dohmeier, MD  montelukast (SINGULAIR) 10 MG tablet TAKE 1 TABLET (10 MG TOTAL) BY MOUTH AT BEDTIME 04/01/15  Yes [name], MD  morphine (MS  CONTIN) 30 MG 12 hr tablet Take 30 mg by mouth every 8 (eight) hours. Not from [facility] 09/23/12  Yes Historical Provider, MD  Multiple Vitamins-Minerals (MULTIVITAMIN PO) Take 1 tablet by mouth daily.   Yes Historical Provider, MD  Pancrelipase, Lip-Prot-Amyl, (CREON) 24000 UNITS CPEP Take 48,000 Units by mouth 4 (four) times daily as needed (for pancreatitis).   Yes Historical Provider, MD  PATADAY 0.2 % SOLN APPLY 1 DROP TO EYE 2 (TWO) TIMES DAILY. 06/11/14  Yes [name], MD  promethazine (PHENERGAN) 25 MG tablet Take 1 tablet (25 mg total) by mouth every 6 (six) hours as needed for nausea or vomiting. 12/27/14  Yes [name], MD  spironolactone (ALDACTONE) 100 MG tablet Take 1 tablet (100 mg total) by mouth daily. 04/01/15  Yes [name], MD  sucralfate (CARAFATE) 1 GM/10ML suspension Take 10 mLs (1 g total) by mouth 4 (four) times daily -  with meals and at bedtime. 05/01/15  Yes [name], MD    Allergies:  Allergies  Allergen Reactions  . Dilaudid [Hydromorphone Hcl] Other (See Comments)    Headache   . Pregabalin   . Metoclopramide Hcl Other (See Comments)    "crawling inside"  . Neurontin [Gabapentin] Other (See Comments)    Tremors   . Other Other (See Comments)    Avoid all SSRI's and SNRI's- damaged pancreas  . Reglan [Metoclopramide]     Social History   Social History  . Marital Status: Single    Spouse Name: N/A  . Number of Children: 0  . Years of Education: College   Occupational History  . disabled    Social History Main Topics  . Smoking status: Never Smoker   . Smokeless tobacco: Never Used  . Alcohol Use: No  . Drug Use: No  . Sexual Activity: Yes   Other Topics Concern  . Not on file   Social History Narrative   Caffeine 2 cups iced tea daily avg.     Review of Systems: Constitutional: negative for fever, chills, night sweats, weight changes, or fatigue  HEENT: negative for vision changes, hearing loss, congestion, rhinorrhea,  ST, epistaxis, or sinus pressure Cardiovascular: negative for chest pain or palpitations Respiratory: negative for hemoptysis, wheezing, shortness of breath, or cough Abdominal: negative for nausea, vomiting, diarrhea, or constipation; positive for abd pain Dermatological: negative for rash, positive for dry skin (palms) Neurologic: negative for headache, dizziness, or syncope All other systems reviewed and are otherwise negative with the exception to those above and in the HPI.  Physical Exam: Blood pressure 118/72, pulse 83, temperature 98.2 F (36.8 C), temperature source Oral, resp. rate 18, height 5' 4.25" (1.632 m), weight 289 lb (131.09 kg), SpO2 97 %., Body mass index is 49.22 kg/(m^2). General: Well developed, well  nourished, in no acute distress. Head: Normocephalic, atraumatic, eyes without discharge, sclera non-icteric, nares are without discharge. Bilateral auditory canals clear, TM's are without perforation, pearly grey and translucent with reflective cone of light bilaterally. Oral cavity moist, posterior pharynx without exudate, erythema, peritonsillar abscess, or post nasal drip.  Neck: Supple. No thyromegaly. Full ROM. No lymphadenopathy. Lungs: Clear bilaterally to auscultation without wheezes, rales, or rhonchi. Breathing is unlabored. Heart: RRR with S1 S2. No murmurs, rubs, or gallops appreciated. Abdomen: Soft, non-tender, non-distended with normoactive bowel sounds. No hepatomegaly. No rebound/guarding. No obvious abdominal masses. Deep induration over epigastrium Msk:  Strength and tone normal for age. Extremities/Skin: Warm and dry. No clubbing or cyanosis. No edema. No rashes or suspicious lesions. Eczematous changes over her palms that are superficial and very fine Neuro: Alert and oriented X 3. Moves all extremities spontaneously. Gait is normal. CNII-XII grossly in tact. Psych:  Responds to questions appropriately with a normal affect.    ASSESSMENT AND PLAN:  55  y.o. year old female with  This chart was scribed in my presence and reviewed by me personally.    ICD-9-CM ICD-10-CM   1. Anxiety state 300.00 F41.1 ALPRAZolam (XANAX) 1 MG tablet  2. Rash of hands 782.1 R21 clotrimazole-betamethasone (LOTRISONE) cream   I suspect patient has a mild form of pancreatitis. I have explained to her carefully that she does need to lose weight and have offered a nutrition consult. She's got talk to Dr. Amedeo Plenty before she takes further action on this regard. She expects to have an EGD done in the near future.    Signed, [name], MD 06/24/2015 9:07 AM

## 2015-07-08 NOTE — Transfer of Care (Signed)
Immediate Anesthesia Transfer of Care Note  Patient: Valerie [name]  Procedure(s) Performed: Procedure(s): ESOPHAGOGASTRODUODENOSCOPY (EGD) WITH PROPOFOL (N/A) BOTOX INJECTION (N/A)  Patient Location: PACU  Anesthesia Type:MAC  Level of Consciousness: awake, alert  and oriented  Airway & Oxygen Therapy: Patient Spontanous Breathing and Patient connected to nasal cannula oxygen  Post-op Assessment: Report given to RN and Post -op Vital signs reviewed and stable  Post vital signs: Reviewed and stable  Last Vitals:  Filed Vitals:   07/08/15 1215  BP: 113/55  Pulse: 57  Temp: 37.2 C  Resp: 13    Complications: No apparent anesthesia complications

## 2015-07-09 ENCOUNTER — Encounter (HOSPITAL_COMMUNITY): Payer: Self-pay | Admitting: Gastroenterology

## 2015-07-17 ENCOUNTER — Ambulatory Visit (INDEPENDENT_AMBULATORY_CARE_PROVIDER_SITE_OTHER): Payer: Medicare Other | Admitting: Family Medicine

## 2015-07-17 VITALS — BP 100/62 | HR 67 | Temp 98.7°F | Resp 14 | Ht 64.75 in | Wt 283.4 lb

## 2015-07-17 DIAGNOSIS — R10814 Left lower quadrant abdominal tenderness: Secondary | ICD-10-CM

## 2015-07-17 DIAGNOSIS — R1310 Dysphagia, unspecified: Secondary | ICD-10-CM | POA: Diagnosis not present

## 2015-07-17 MED ORDER — AMLODIPINE BESYLATE 2.5 MG PO TABS: 2.5000 mg | ORAL_TABLET | Freq: Every day | ORAL | Status: DC

## 2015-07-17 NOTE — Progress Notes (Signed)
By signing my name below, I, [name], attest that this documentation has been prepared under the direction and in the presence of [name], [place], Medical Scribe. 07/17/2015.  8:32 AM.   Patient ID: Valerie [name] MRN: [id number], DOB: January 31, 1961, 55 y.o. Date of Encounter: 07/17/2015  Primary Physician: [name], MD  Chief Complaint:  Chief Complaint  Patient presents with  . Follow-up    Anxiety  . Dysphagia    After endoscopy    HPI:  Valerie [name] is a 55 y.o. female who presents to [facility] for a follow up.  Pt presents here with her blood work results from [facility], that showed GLU of 119, BUN of 18, CR of 1.11, and eGFR of 51. Pt reports that she was seen at the ER on 01/25 for dysphagia where she was not been able to swallow liquids or food. She notes that she had endoscopy done the next day as advised by Dr. Daisey Must, however she indicates that this feeling is still present if she is having any oral intakes other than a pudding-like consistency. She is suspicious that one of her medications might have caused that. She reports that she has associated mild nausea, and abdominal pain. Pt also states that she has lost about 6 lbs of weight during the past 3 weeks. She however denies diarrhea, or bowel changes.   Wt Readings from Last 3 Encounters:  07/17/15 283 lb 6.4 oz (128.549 kg)  07/07/15 287 lb (130.182 kg)  06/24/15 289 lb (131.09 kg)   Pt reports that she fell on her left side after slipping on ice about a month ago, and indicates that she was experiencing left elbow, back, and shoulder pain. She has followed up with a specialist for this.  Past Medical History  Diagnosis Date  . Hypertension   . Anxiety   . Mental disorder   . Depression   . Sleep apnea   . Headache(784.0)   . Neuromuscular disorder ([place])   . 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 Fibromyalgia   . Arthritis    degenerative  . Anemia 05/01/2012  . Gastro-esophageal reflux   . Sleep apnea     CPAP-9  . Pancreatitis   . Diarrhea     EPISODIC  . Back pain     lower     Home Meds: Prior to Admission medications   Medication Sig Start Date End Date Taking? Authorizing Provider  albuterol (PROVENTIL HFA;VENTOLIN HFA) 108 (90 BASE) MCG/ACT inhaler Inhale 2 puffs into the lungs as needed for wheezing or shortness of breath. 03/04/15  Yes [name], MD  allopurinol (ZYLOPRIM) 300 MG tablet Take 0.5 tablets (150 mg total) by mouth daily. 04/01/15  Yes [name], MD  ALPRAZolam Duanne Moron) 1 MG tablet Take 1 tablet (1 mg total) by mouth 3 (three) times daily as needed for anxiety. 06/24/15  Yes [name], MD  baclofen (LIORESAL) 10 MG tablet TAKE 1 TABLET (10 MG TOTAL) BY MOUTH 3 (THREE TIMES DAILY) 02/06/15  Yes [name], MD  benazepril (LOTENSIN) 40 MG tablet TAKE 1 TABLET (40 MG TOTAL) BY MOUTH DAILY. 04/01/15  Yes [name], MD  Biotin 5000 MCG CAPS Take 10,000 mcg by mouth daily.    Yes Historical Provider, MD  cetirizine (ZYRTEC) 10 MG tablet Take 10 mg by mouth daily.   Yes Historical Provider, MD  Cholecalciferol (VITAMIN D) 2000  UNITS tablet Take 2,000 Units by mouth daily.   Yes Historical Provider, MD  clotrimazole-betamethasone (LOTRISONE) cream Apply 1 application topically 2 (two) times daily. 06/24/15  Yes [name], MD  dexlansoprazole (DEXILANT) 60 MG capsule Take 60 mg by mouth 2 (two) times daily.   Yes Historical Provider, MD  fluticasone (FLONASE) 50 MCG/ACT nasal spray Place 2 sprays into both nostrils daily. 09/30/14  Yes [name], MD  furosemide (LASIX) 20 MG tablet TAKE 1 TABLET BY MOUTH DAILY AS NEEDED 11/19/14  Yes [name], MD  HYDROcodone-acetaminophen (NORCO) 10-325 MG per tablet Take 1-2 tablets by mouth every 6 (six) hours as needed (pain.).   Yes Historical Provider, MD  ibuprofen (ADVIL,MOTRIN) 600 MG tablet Take 600 mg by mouth  every 6 (six) hours as needed for mild pain.    Yes Historical Provider, MD  metoprolol succinate (TOPROL-XL) 100 MG 24 hr tablet Take 1 tablet (100 mg total) by mouth daily. Take with or immediately following a meal. 04/01/15  Yes [name], MD  modafinil (PROVIGIL) 200 MG tablet Take 0.5 tablets (100 mg total) by mouth 2 (two) times daily. 12/02/14  Yes Carmen Dohmeier, MD  montelukast (SINGULAIR) 10 MG tablet TAKE 1 TABLET (10 MG TOTAL) BY MOUTH AT BEDTIME 04/01/15  Yes [name], MD  morphine (MS CONTIN) 30 MG 12 hr tablet Take 30 mg by mouth every 8 (eight) hours. Not from [facility] 09/23/12  Yes Historical Provider, MD  Multiple Vitamins-Minerals (MULTIVITAMIN PO) Take 1 tablet by mouth daily.   Yes Historical Provider, MD  Olopatadine HCl (PATADAY) 0.2 % SOLN APPLY 1 DROP TO EYE 2 (TWO) TIMES DAILY. 07/05/15  Yes [name], MD  Pancrelipase, Lip-Prot-Amyl, (CREON) 24000 UNITS CPEP Take 48,000 Units by mouth 4 (four) times daily as needed (for pancreatitis).   Yes Historical Provider, MD  spironolactone (ALDACTONE) 100 MG tablet Take 1 tablet (100 mg total) by mouth daily. 04/01/15  Yes [name], MD  sucralfate (CARAFATE) 1 GM/10ML suspension Take 10 mLs (1 g total) by mouth 4 (four) times daily -  with meals and at bedtime. 05/01/15  Yes [name], MD  fexofenadine (ALLEGRA) 180 MG tablet Take 180 mg by mouth daily. Reported on 07/17/2015    Historical Provider, MD  fluconazole (DIFLUCAN) 150 MG tablet Take 1 tablet (150 mg total) by mouth once. Patient not taking: Reported on 07/17/2015 03/04/15   [name], MD  promethazine (PHENERGAN) 25 MG tablet Take 1 tablet (25 mg total) by mouth every 6 (six) hours as needed for nausea or vomiting. Patient not taking: Reported on 07/17/2015 12/27/14   [name], MD    Allergies:  Allergies  Allergen Reactions  . Dilaudid [Hydromorphone Hcl] Other (See Comments)    Headache   . Pregabalin   . Metoclopramide Hcl Other  (See Comments)    "crawling inside"  . Neurontin [Gabapentin] Other (See Comments)    Tremors   . Other Other (See Comments)    Avoid all SSRI's and SNRI's- damaged pancreas  . Reglan [Metoclopramide]     Social History   Social History  . Marital Status: Single    Spouse Name: N/A  . Number of Children: 0  . Years of Education: College   Occupational History  . disabled    Social History Main Topics  . Smoking status: Never Smoker   . Smokeless tobacco: Never Used  . Alcohol Use: No  . Drug Use: No  . Sexual Activity: Yes   Other  Topics Concern  . Not on file   Social History Narrative   Caffeine 2 cups iced tea daily avg.     Review of Systems: Constitutional: negative for chills, fever, night sweats, or fatigue. Positive for weight changes.  HEENT: negative for vision changes, hearing loss, congestion, rhinorrhea, ST, epistaxis, or sinus pressure. Positive for trouble swallowing.  Cardiovascular: negative for chest pain or palpitations Respiratory: negative for hemoptysis, wheezing, shortness of breath, or cough Abdominal: negative for vomiting, diarrhea, or constipation. Positive for abdominal pain, nausea.  Dermatological: negative for rash Neurologic: negative for headache, dizziness, or syncope All other systems reviewed and are otherwise negative with the exception to those above and in the HPI.  Physical Exam: Blood pressure 100/62, pulse 67, temperature 98.7 F (37.1 C), temperature source Oral, resp. rate 14, height 5' 4.75" (1.645 m), weight 283 lb 6.4 oz (128.549 kg), SpO2 99 %., Body mass index is 47.5 kg/(m^2). General: Well developed, well nourished, in no acute distress. Head: Normocephalic, atraumatic, eyes without discharge, sclera non-icteric, nares are without discharge. Bilateral auditory canals clear, TM's are without perforation, pearly grey and translucent with reflective cone of light bilaterally. Oral cavity moist, posterior pharynx without  exudate, erythema, peritonsillar abscess, or post nasal drip.  Neck: Supple. No thyromegaly. Full ROM. No lymphadenopathy. Lungs: Clear bilaterally to auscultation without wheezes, rales, or rhonchi. Breathing is unlabored. Heart: RR with S1 S2. No rubs, or gallops appreciated. Very soft systolic murmur best heard on the right sternal border.  Abdomen: Soft, non-tender, non-distended with normoactive bowel sounds. No hepatomegaly. No rebound/guarding. No obvious abdominal masses. Tenderness and fullness in the LLQ.  Mildly Tender on th epigastric region.  Msk:  Strength and tone normal for age. Extremities/Skin: Warm and dry. No clubbing or cyanosis. No edema. No rashes or suspicious lesions. Neuro: Alert and oriented X 3. Moves all extremities spontaneously. Gait is normal. CNII-XII grossly in tact. Psych:  Responds to questions appropriately with a normal affect.   Labs:  ASSESSMENT AND PLAN:  55 y.o. year old female with persistent problems swallowing despite the fact that she had a normal endoscopy several days ago. This chart was scribed in my presence and reviewed by me personally.    ICD-9-CM ICD-10-CM   1. Dysphagia 787.20 R13.10 amLODipine (NORVASC) 2.5 MG tablet  2. LLQ abdominal tenderness 789.64 R10.814    I asked patient come back in 2 weeks to let me know she's doing. She is also chronic but may know if the left lower quadrant pain  Signed, [name], MD 07/17/2015 8:15 AM

## 2015-07-20 DIAGNOSIS — M79601 Pain in right arm: Secondary | ICD-10-CM | POA: Diagnosis not present

## 2015-07-20 DIAGNOSIS — Z79891 Long term (current) use of opiate analgesic: Secondary | ICD-10-CM | POA: Diagnosis not present

## 2015-07-20 DIAGNOSIS — M461 Sacroiliitis, not elsewhere classified: Secondary | ICD-10-CM | POA: Diagnosis not present

## 2015-07-20 DIAGNOSIS — M545 Low back pain: Secondary | ICD-10-CM | POA: Diagnosis not present

## 2015-07-20 DIAGNOSIS — M79661 Pain in right lower leg: Secondary | ICD-10-CM | POA: Diagnosis not present

## 2015-07-20 DIAGNOSIS — M79662 Pain in left lower leg: Secondary | ICD-10-CM | POA: Diagnosis not present

## 2015-07-20 DIAGNOSIS — F332 Major depressive disorder, recurrent severe without psychotic features: Secondary | ICD-10-CM | POA: Diagnosis not present

## 2015-07-20 DIAGNOSIS — E669 Obesity, unspecified: Secondary | ICD-10-CM | POA: Diagnosis not present

## 2015-07-23 ENCOUNTER — Other Ambulatory Visit: Payer: Self-pay | Admitting: Gastroenterology

## 2015-07-23 DIAGNOSIS — K3184 Gastroparesis: Secondary | ICD-10-CM

## 2015-07-23 DIAGNOSIS — R1013 Epigastric pain: Secondary | ICD-10-CM | POA: Diagnosis not present

## 2015-07-23 DIAGNOSIS — R112 Nausea with vomiting, unspecified: Secondary | ICD-10-CM | POA: Diagnosis not present

## 2015-07-23 DIAGNOSIS — R1011 Right upper quadrant pain: Secondary | ICD-10-CM

## 2015-07-27 ENCOUNTER — Ambulatory Visit
Admission: RE | Admit: 2015-07-27 | Discharge: 2015-07-27 | Disposition: A | Discharge status: Home / Self Care | Payer: Medicare Other | Source: Ambulatory Visit | Attending: Gastroenterology | Admitting: Gastroenterology

## 2015-07-27 DIAGNOSIS — K760 Fatty (change of) liver, not elsewhere classified: Secondary | ICD-10-CM | POA: Diagnosis not present

## 2015-07-27 DIAGNOSIS — R1011 Right upper quadrant pain: Secondary | ICD-10-CM

## 2015-07-27 DIAGNOSIS — K3184 Gastroparesis: Secondary | ICD-10-CM

## 2015-07-27 DIAGNOSIS — K7689 Other specified diseases of liver: Secondary | ICD-10-CM | POA: Diagnosis not present

## 2015-07-27 DIAGNOSIS — R935 Abnormal findings on diagnostic imaging of other abdominal regions, including retroperitoneum: Secondary | ICD-10-CM | POA: Diagnosis not present

## 2015-07-27 MED ORDER — GADOBENATE DIMEGLUMINE 529 MG/ML IV SOLN
20.0000 mL | Freq: Once | INTRAVENOUS | Status: AC | PRN
  Administered 2015-07-27: 20 mL via INTRAVENOUS

## 2015-07-30 ENCOUNTER — Other Ambulatory Visit: Payer: Medicare Other

## 2015-07-31 ENCOUNTER — Ambulatory Visit (INDEPENDENT_AMBULATORY_CARE_PROVIDER_SITE_OTHER): Payer: Medicare Other | Admitting: Family Medicine

## 2015-07-31 VITALS — BP 120/78 | HR 80 | Temp 98.8°F | Resp 14 | Ht 64.75 in | Wt 281.0 lb

## 2015-07-31 DIAGNOSIS — F411 Generalized anxiety disorder: Secondary | ICD-10-CM | POA: Diagnosis not present

## 2015-07-31 DIAGNOSIS — K3184 Gastroparesis: Secondary | ICD-10-CM | POA: Diagnosis not present

## 2015-07-31 DIAGNOSIS — J309 Allergic rhinitis, unspecified: Secondary | ICD-10-CM

## 2015-07-31 DIAGNOSIS — I10 Essential (primary) hypertension: Secondary | ICD-10-CM

## 2015-07-31 MED ORDER — BENAZEPRIL HCL 40 MG PO TABS: ORAL_TABLET | ORAL | Status: DC

## 2015-07-31 MED ORDER — MONTELUKAST SODIUM 10 MG PO TABS: ORAL_TABLET | ORAL | Status: DC

## 2015-07-31 MED ORDER — ALPRAZOLAM 1 MG PO TABS: 1.0000 mg | ORAL_TABLET | Freq: Three times a day (TID) | ORAL | Status: DC | PRN

## 2015-07-31 MED ORDER — METOPROLOL SUCCINATE ER 100 MG PO TB24: 100.0000 mg | ORAL_TABLET | Freq: Every day | ORAL | Status: DC

## 2015-07-31 MED ORDER — SPIRONOLACTONE 100 MG PO TABS: 100.0000 mg | ORAL_TABLET | Freq: Every day | ORAL | Status: DC

## 2015-07-31 NOTE — Patient Instructions (Signed)
Plan on recheck in one month

## 2015-07-31 NOTE — Progress Notes (Signed)
@[email or web address] @  By signing my name below, I, Valerie [name], attest that this documentation has been prepared under the direction and in the presence of [name], MD.  Electronically Signed: Thea Alken, ED Scribe. 07/31/2015. 8:14 AM.  Patient ID: DIAMONDS ABOUD MRN: [id number], DOB: 03/28/1961, 55 y.o. Date of Encounter: 07/31/2015, 8:11 AM  Primary Physician: [name], MD  Chief Complaint:  Chief Complaint  Patient presents with  . Follow-up    GI issue     HPI: 55 y.o. year old female with history below presents for a follow up on GI issues. Pt has hx of gastroparesis/ pain. She is followed by Dr. Amedeo Plenty. . Her last botox injection was 1/26 by Dr. Amedeo Plenty. Pt states that this has somewhat improved since last visit with me. She had MRI of abdomen 2/14 which showed cholecystectomy, mild diffuse hepatic steatosis and right renal cyst, but otherwise normal. She also has ERCP which was negative. Pt states when botox begin to where off she tends to have trouble sleeping. States she woke up this morning with leg pain. She does not like to take hydrocodone in the morning due to medication waking her up.   Pt has been doing well with her weight.  Wt Readings from Last 3 Encounters:  07/31/15 281 lb (127.461 kg)  07/17/15 283 lb 6.4 oz (128.549 kg)  07/07/15 287 lb (130.182 kg)   Pt would like medication refill Singulair, metoprolol, spironolactone.   Pt also reports feeling stressed. 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 Pt states she is responsible for working the  [place] for [employer]. Pt feels frustrated that she does not have a say with the amount of work she contributes. She has tried to voice her concerns to her church member that she would like to be apart of the team.   Past Medical History  Diagnosis Date  . Hypertension   . Anxiety   . Mental disorder   . Depression   . Sleep apnea   .  Headache(784.0)   . Neuromuscular disorder ([place])   . 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 Fibromyalgia   . Arthritis     degenerative  . Anemia 05/01/2012  . Gastro-esophageal reflux   . Sleep apnea     CPAP-9  . Pancreatitis   . Diarrhea     EPISODIC  . Back pain     lower     Home Meds: Prior to Admission medications   Medication Sig Start Date End Date Taking? Authorizing Provider  albuterol (PROVENTIL HFA;VENTOLIN HFA) 108 (90 BASE) MCG/ACT inhaler Inhale 2 puffs into the lungs as needed for wheezing or shortness of breath. 03/04/15  Yes [name], MD  allopurinol (ZYLOPRIM) 300 MG tablet Take 0.5 tablets (150 mg total) by mouth daily. 04/01/15  Yes [name], MD  ALPRAZolam Duanne Moron) 1 MG tablet Take 1 tablet (1 mg total) by mouth 3 (three) times daily as needed for anxiety. 06/24/15  Yes [name], MD  amLODipine (NORVASC) 2.5 MG tablet Take 1 tablet (2.5 mg total) by mouth daily. 07/17/15  Yes [name], MD  baclofen (LIORESAL) 10 MG tablet TAKE 1 TABLET (10 MG TOTAL) BY MOUTH 3 (THREE TIMES DAILY) 02/06/15  Yes [name], MD  benazepril (LOTENSIN) 40 MG tablet TAKE 1 TABLET (40 MG TOTAL) BY MOUTH DAILY. 04/01/15  Yes [name], MD  Biotin 5000  MCG CAPS Take 10,000 mcg by mouth daily.    Yes Historical Provider, MD  cetirizine (ZYRTEC) 10 MG tablet Take 10 mg by mouth daily.   Yes Historical Provider, MD  Cholecalciferol (VITAMIN D) 2000 UNITS tablet Take 2,000 Units by mouth daily.   Yes Historical Provider, MD  clotrimazole-betamethasone (LOTRISONE) cream Apply 1 application topically 2 (two) times daily. 06/24/15  Yes [name], MD  dexlansoprazole (DEXILANT) 60 MG capsule Take 60 mg by mouth 2 (two) times daily.   Yes Historical Provider, MD  fexofenadine (ALLEGRA) 180 MG tablet Take 180 mg by mouth daily. Reported on 07/17/2015   Yes Historical Provider, MD  fluticasone (FLONASE) 50 MCG/ACT nasal spray Place 2 sprays into both  nostrils daily. 09/30/14  Yes [name], MD  furosemide (LASIX) 20 MG tablet TAKE 1 TABLET BY MOUTH DAILY AS NEEDED 11/19/14  Yes [name], MD  HYDROcodone-acetaminophen (NORCO) 10-325 MG per tablet Take 1-2 tablets by mouth every 6 (six) hours as needed (pain.).   Yes Historical Provider, MD  ibuprofen (ADVIL,MOTRIN) 600 MG tablet Take 600 mg by mouth every 6 (six) hours as needed for mild pain.    Yes Historical Provider, MD  metoprolol succinate (TOPROL-XL) 100 MG 24 hr tablet Take 1 tablet (100 mg total) by mouth daily. Take with or immediately following a meal. 04/01/15  Yes [name], MD  modafinil (PROVIGIL) 200 MG tablet Take 0.5 tablets (100 mg total) by mouth 2 (two) times daily. 12/02/14  Yes Carmen Dohmeier, MD  montelukast (SINGULAIR) 10 MG tablet TAKE 1 TABLET (10 MG TOTAL) BY MOUTH AT BEDTIME 04/01/15  Yes [name], MD  morphine (MS CONTIN) 30 MG 12 hr tablet Take 30 mg by mouth every 8 (eight) hours. Not from [facility] 09/23/12  Yes Historical Provider, MD  Multiple Vitamins-Minerals (MULTIVITAMIN PO) Take 1 tablet by mouth daily.   Yes Historical Provider, MD  Olopatadine HCl (PATADAY) 0.2 % SOLN APPLY 1 DROP TO EYE 2 (TWO) TIMES DAILY. 07/05/15  Yes [name], MD  Pancrelipase, Lip-Prot-Amyl, (CREON) 24000 UNITS CPEP Take 48,000 Units by mouth 4 (four) times daily as needed (for pancreatitis).   Yes Historical Provider, MD  spironolactone (ALDACTONE) 100 MG tablet Take 1 tablet (100 mg total) by mouth daily. 04/01/15  Yes [name], MD  sucralfate (CARAFATE) 1 GM/10ML suspension Take 10 mLs (1 g total) by mouth 4 (four) times daily -  with meals and at bedtime. 05/01/15  Yes [name], MD  fluconazole (DIFLUCAN) 150 MG tablet Take 1 tablet (150 mg total) by mouth once. Patient not taking: Reported on 07/17/2015 03/04/15   [name], MD  promethazine (PHENERGAN) 25 MG tablet Take 1 tablet (25 mg total) by mouth every 6 (six) hours as needed for  nausea or vomiting. Patient not taking: Reported on 07/17/2015 12/27/14   [name], MD    Allergies:  Allergies  Allergen Reactions  . Dilaudid [Hydromorphone Hcl] Other (See Comments)    Headache   . Pregabalin   . Metoclopramide Hcl Other (See Comments)    "crawling inside"  . Neurontin [Gabapentin] Other (See Comments)    Tremors   . Other Other (See Comments)    Avoid all SSRI's and SNRI's- damaged pancreas  . Reglan [Metoclopramide]     Social History   Social History  . Marital Status: Single    Spouse Name: N/A  . Number of Children: 0  . Years of Education: College   Occupational History  . disabled  Social History Main Topics  . Smoking status: Never Smoker   . Smokeless tobacco: Never Used  . Alcohol Use: No  . Drug Use: No  . Sexual Activity: Yes   Other Topics Concern  . Not on file   Social History Narrative   Caffeine 2 cups iced tea daily avg.     Review of Systems: Constitutional: negative for chills, fever, night sweats, weight changes, or fatigue  HEENT: negative for vision changes, hearing loss, congestion, rhinorrhea, ST, epistaxis, or sinus pressure Cardiovascular: negative for chest pain or palpitations Respiratory: negative for hemoptysis, wheezing, shortness of breath, or cough Abdominal: negative for vomiting, diarrhea, or constipation Dermatological: negative for rash Neurologic: negative for headache, dizziness, or syncope All other systems reviewed and are otherwise negative with the exception to those above and in the HPI.   Physical Exam Blood pressure 120/78, pulse 80, temperature 98.8 F (37.1 C), temperature source Oral, resp. rate 14, height 5' 4.75" (1.645 m), weight 281 lb (127.461 kg), SpO2 97 %., Body mass index is 47.1 kg/(m^2). General: Well developed, well nourished, in no acute distress. Head: Normocephalic, atraumatic, eyes without discharge, sclera non-icteric, nares are without discharge. Bilateral auditory  canals clear, TM's are without perforation, pearly grey and translucent with reflective cone of light bilaterally. Oral cavity moist, posterior pharynx without exudate, erythema, peritonsillar abscess, or post nasal drip.  Neck: Supple. No thyromegaly. Full ROM. No lymphadenopathy. Lungs: Clear bilaterally to auscultation without wheezes, rales, or rhonchi. Breathing is unlabored. Heart: RRR with S1 S2. No murmurs, rubs, or gallops appreciated. Abdomen: Soft, Tender epigastrium and RLQ.  normoactive bowel sounds. No hepatomegaly. No rebound/guarding. No obvious abdominal masses. Msk:  Strength and tone normal for age. Extremities/Skin: Warm and dry. No clubbing or cyanosis. No edema. No rashes or suspicious lesions. Neuro: Alert and oriented X 3. Moves all extremities spontaneously. Gait is normal. CNII-XII grossly in tact. Psych:  Responds to questions appropriately with a normal affect.    ASSESSMENT AND PLAN:  55 y.o. year old female with  This chart was scribed in my presence and reviewed by me personally.    ICD-9-CM ICD-10-CM   1. Allergic rhinitis, unspecified allergic rhinitis type 477.9 J30.9 montelukast (SINGULAIR) 10 MG tablet  2. Anxiety state 300.00 F41.1 ALPRAZolam (XANAX) 1 MG tablet  3. Essential hypertension 401.9 I10 spironolactone (ALDACTONE) 100 MG tablet     metoprolol succinate (TOPROL-XL) 100 MG 24 hr tablet     benazepril (LOTENSIN) 40 MG tablet  4. Gastroparesis 536.3 K31.84       Signed, [name], MD 07/31/2015 8:11 AM

## 2015-08-19 ENCOUNTER — Other Ambulatory Visit: Payer: Self-pay | Admitting: Family Medicine

## 2015-08-31 ENCOUNTER — Ambulatory Visit (INDEPENDENT_AMBULATORY_CARE_PROVIDER_SITE_OTHER): Payer: Medicare Other | Admitting: Family Medicine

## 2015-08-31 ENCOUNTER — Ambulatory Visit (INDEPENDENT_AMBULATORY_CARE_PROVIDER_SITE_OTHER): Payer: Medicare Other

## 2015-08-31 VITALS — BP 123/84 | HR 69 | Temp 98.3°F | Resp 16 | Ht 64.75 in | Wt 283.4 lb

## 2015-08-31 DIAGNOSIS — M791 Myalgia, unspecified site: Secondary | ICD-10-CM

## 2015-08-31 DIAGNOSIS — R05 Cough: Secondary | ICD-10-CM

## 2015-08-31 DIAGNOSIS — R059 Cough, unspecified: Secondary | ICD-10-CM

## 2015-08-31 LAB — POCT CBC
Granulocyte percent: 62.8 %G (ref 37–80)
HCT, POC: 38 % (ref 37.7–47.9)
Hemoglobin: 13.5 g/dL (ref 12.2–16.2)
Lymph, poc: 2.4 (ref 0.6–3.4)
MCH, POC: 31 pg (ref 27–31.2)
MCHC: 35.5 g/dL — AB (ref 31.8–35.4)
MCV: 87.2 fL (ref 80–97)
MID (cbc): 0.5 (ref 0–0.9)
MPV: 7.7 fL (ref 0–99.8)
POC Granulocyte: 5 (ref 2–6.9)
POC LYMPH PERCENT: 31 %L (ref 10–50)
POC MID %: 6.2 %M (ref 0–12)
Platelet Count, POC: 239 10*3/uL (ref 142–424)
RBC: 4.36 M/uL (ref 4.04–5.48)
RDW, POC: 13.6 %
WBC: 7.9 10*3/uL (ref 4.6–10.2)

## 2015-08-31 MED ORDER — LEVOFLOXACIN 500 MG PO TABS: 500.0000 mg | ORAL_TABLET | Freq: Every day | ORAL | Status: DC

## 2015-08-31 NOTE — Progress Notes (Signed)
By signing my name below, I, Moises Blood, attest that this documentation has been prepared under the direction and in the presence of [name], MD. Electronically Signed: Moises Blood, [place]. 08/31/2015 , 8:33 AM .  Patient was seen in room 12 .   Patient ID: Valerie [name] MRN: [id number], DOB: 19-Jun-1960, 56 y.o. Date of Encounter: 08/31/2015  Primary Physician: [name], MD  Chief Complaint:  Chief Complaint  Patient presents with  . Follow-up  . GI issues  . kidney functions  . chest congestion    "coughing up green" x 2 wks    HPI:  Valerie [name] is a 55 y.o. female who presents to [facility] for follow up.   She also complains of GI issues.   She notes productive cough with chest congestion for the past 2 weeks.   Past Medical History  Diagnosis Date  . Hypertension   . Anxiety   . Mental disorder   . Depression   . Sleep apnea   . Headache(784.0)   . Neuromuscular disorder ([place])   . 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 Fibromyalgia   . Arthritis     degenerative  . Anemia 05/01/2012  . Gastro-esophageal reflux   . Sleep apnea     CPAP-9  . Pancreatitis   . Diarrhea     EPISODIC  . Back pain     lower     Home Meds: Prior to Admission medications   Medication Sig Start Date End Date Taking? Authorizing Provider  albuterol (PROVENTIL HFA;VENTOLIN HFA) 108 (90 BASE) MCG/ACT inhaler Inhale 2 puffs into the lungs as needed for wheezing or shortness of breath. 03/04/15   [name], MD  allopurinol (ZYLOPRIM) 300 MG tablet Take 0.5 tablets (150 mg total) by mouth daily. 04/01/15   [name], MD  ALPRAZolam Duanne Moron) 1 MG tablet Take 1 tablet (1 mg total) by mouth 3 (three) times daily as needed for anxiety. 07/31/15   [name], MD  amLODipine (NORVASC) 2.5 MG tablet Take 1 tablet (2.5 mg total) by mouth daily. 07/17/15   [name], MD  baclofen (LIORESAL) 10 MG tablet TAKE 1 TABLET (10  MG TOTAL) BY MOUTH 3 (THREE TIMES DAILY) 02/06/15   [name], MD  benazepril (LOTENSIN) 40 MG tablet TAKE 1 TABLET (40 MG TOTAL) BY MOUTH DAILY. 07/31/15   [name], MD  Biotin 5000 MCG CAPS Take 10,000 mcg by mouth daily.     Historical Provider, MD  cetirizine (ZYRTEC) 10 MG tablet Take 10 mg by mouth daily.    Historical Provider, MD  Cholecalciferol (VITAMIN D) 2000 UNITS tablet Take 2,000 Units by mouth daily.    Historical Provider, MD  clotrimazole-betamethasone (LOTRISONE) cream Apply 1 application topically 2 (two) times daily. 06/24/15   [name], MD  dexlansoprazole (DEXILANT) 60 MG capsule Take 60 mg by mouth 2 (two) times daily.    Historical Provider, MD  fexofenadine (ALLEGRA) 180 MG tablet Take 180 mg by mouth daily. Reported on 07/17/2015    Historical Provider, MD  fluconazole (DIFLUCAN) 150 MG tablet Take 1 tablet (150 mg total) by mouth once. Patient not taking: Reported on 07/17/2015 03/04/15   [name], MD  fluticasone [facility]) 50 MCG/ACT nasal spray Place 2 sprays into both nostrils daily. 09/30/14   [name], MD  furosemide (LASIX) 20 MG tablet TAKE 1 TABLET BY MOUTH DAILY AS NEEDED 11/19/14   [name], MD  HYDROcodone-acetaminophen (NORCO) 10-325 MG per tablet Take 1-2 tablets by mouth every 6 (six) hours as needed (pain.).    Historical Provider, MD  ibuprofen (ADVIL,MOTRIN) 600 MG tablet Take 600 mg by mouth every 6 (six) hours as needed for mild pain.     Historical Provider, MD  metoprolol succinate (TOPROL-XL) 100 MG 24 hr tablet Take 1 tablet (100 mg total) by mouth daily. Take with or immediately following a meal. 07/31/15   [name], MD  modafinil (PROVIGIL) 200 MG tablet Take 0.5 tablets (100 mg total) by mouth 2 (two) times daily. 12/02/14   Carmen Dohmeier, MD  montelukast (SINGULAIR) 10 MG tablet TAKE 1 TABLET (10 MG TOTAL) BY MOUTH AT BEDTIME 07/31/15   [name], MD  morphine (MS CONTIN) 30 MG 12 hr tablet Take 30 mg by  mouth every 8 (eight) hours. Not from [facility] 09/23/12   Historical Provider, MD  Multiple Vitamins-Minerals (MULTIVITAMIN PO) Take 1 tablet by mouth daily.    Historical Provider, MD  Olopatadine HCl (PATADAY) 0.2 % SOLN APPLY 1 DROP TO EYE 2 (TWO) TIMES DAILY. 07/05/15   [name], MD  Pancrelipase, Lip-Prot-Amyl, (CREON) 24000 UNITS CPEP Take 48,000 Units by mouth 4 (four) times daily as needed (for pancreatitis).    Historical Provider, MD  promethazine (PHENERGAN) 25 MG tablet Take 1 tablet (25 mg total) by mouth every 6 (six) hours as needed for nausea or vomiting. Patient not taking: Reported on 07/17/2015 12/27/14   [name], MD  spironolactone (ALDACTONE) 100 MG tablet Take 1 tablet (100 mg total) by mouth daily. 07/31/15   [name], MD  sucralfate (CARAFATE) 1 GM/10ML suspension Take 10 mLs (1 g total) by mouth 4 (four) times daily -  with meals and at bedtime. 05/01/15   [name], MD    Allergies:  Allergies  Allergen Reactions  . Dilaudid [Hydromorphone Hcl] Other (See Comments)    Headache   . Pregabalin   . Metoclopramide Hcl Other (See Comments)    "crawling inside"  . Neurontin [Gabapentin] Other (See Comments)    Tremors   . Other Other (See Comments)    Avoid all SSRI's and SNRI's- damaged pancreas  . Reglan [Metoclopramide]     Social History   Social History  . Marital Status: Single    Spouse Name: N/A  . Number of Children: 0  . Years of Education: College   Occupational History  . disabled    Social History Main Topics  . Smoking status: Never Smoker   . Smokeless tobacco: Never Used  . Alcohol Use: No  . Drug Use: No  . Sexual Activity: Yes   Other Topics Concern  . Not on file   Social History Narrative   Caffeine 2 cups iced tea daily avg.     Review of Systems: Constitutional: negative for fever, chills, night sweats, weight changes, or fatigue  HEENT: negative for vision changes, hearing loss, congestion, rhinorrhea, ST,  epistaxis, or sinus pressure Cardiovascular: negative for chest pain or palpitations Respiratory: negative for hemoptysis, wheezing, shortness of breath, or cough Abdominal: negative for abdominal pain, nausea, vomiting, diarrhea, or constipation Dermatological: negative for rash Neurologic: negative for headache, dizziness, or syncope All other systems reviewed and are otherwise negative with the exception to those above and in the HPI.  Physical Exam: Blood pressure 123/84, pulse 69, temperature 98.3 F (36.8 C), temperature source Oral, resp. rate 16, height 5' 4.75" (1.645 m), weight 283 lb 6.4 oz (128.549 kg), SpO2  95 %., Body mass index is 47.5 kg/(m^2). General: Well developed, well nourished, in no acute distress. Head: Normocephalic, atraumatic, eyes without discharge, sclera non-icteric, nares are without discharge. Bilateral auditory canals clear, TM's are without perforation, pearly grey and translucent with reflective cone of light bilaterally. Oral cavity moist, posterior pharynx without exudate, erythema, peritonsillar abscess, or post nasal drip.  Neck: Supple. No thyromegaly. Full ROM. No lymphadenopathy. Lungs: Clear bilaterally to auscultation without wheezes, rales, or rhonchi. Breathing is unlabored. Heart: RRR with S1 S2. No murmurs, rubs, or gallops appreciated. Abdomen: Soft, non-tender, non-distended with normoactive bowel sounds. No hepatomegaly. No rebound/guarding. No obvious abdominal masses. Msk:  Strength and tone normal for age. Extremities/Skin: Warm and dry. No clubbing or cyanosis. No edema. No rashes or suspicious lesions. Neuro: Alert and oriented X 3. Moves all extremities spontaneously. Gait is normal. CNII-XII grossly in tact. Psych:  Responds to questions appropriately with a normal affect.   Labs: Results for orders placed or performed in visit on 08/31/15  POCT CBC  Result Value Ref Range   WBC 7.9 4.6 - 10.2 K/uL   Lymph, poc 2.4 0.6 - 3.4   POC  LYMPH PERCENT 31.0 10 - 50 %L   MID (cbc) 0.5 0 - 0.9   POC MID % 6.2 0 - 12 %M   POC Granulocyte 5.0 2 - 6.9   Granulocyte percent 62.8 37 - 80 %G   RBC 4.36 4.04 - 5.48 M/uL   Hemoglobin 13.5 12.2 - 16.2 g/dL   HCT, POC 38.0 37.7 - 47.9 %   MCV 87.2 80 - 97 fL   MCH, POC 31.0 27 - 31.2 pg   MCHC 35.5 (A) 31.8 - 35.4 g/dL   RDW, POC 13.6 %   Platelet Count, POC 239 142 - 424 K/uL   MPV 7.7 0 - 99.8 fL   Dg Chest 2 View  08/31/2015  CLINICAL DATA:  Wheezing.  Productive cough. EXAM: CHEST  2 VIEW COMPARISON:  01/31/2014. FINDINGS: Mediastinum hilar structures normal. Low lung volumes with mild bibasilar atelectasis. No pleural effusion pneumothorax. IMPRESSION: Lower lung volumes with mild bibasilar atelectasis. Electronically Signed   By: Marcello Moores  Register   On: 08/31/2015 09:05     ASSESSMENT AND PLAN:  55 y.o. year old female with  This chart was scribed in my presence and reviewed by me personally.    ICD-9-CM ICD-10-CM   1. Cough 786.2 R05 POCT CBC     DG Chest 2 View     levofloxacin (LEVAQUIN) 500 MG tablet  2. Myalgia 729.1 M79.1 POCT CBC     DG Chest 2 View     levofloxacin (LEVAQUIN) 500 MG tablet      Signed, [name], MD 08/31/2015 8:33 AM

## 2015-08-31 NOTE — Patient Instructions (Signed)

## 2015-09-06 ENCOUNTER — Other Ambulatory Visit: Payer: Self-pay | Admitting: Family Medicine

## 2015-09-06 DIAGNOSIS — R05 Cough: Secondary | ICD-10-CM

## 2015-09-06 DIAGNOSIS — R059 Cough, unspecified: Secondary | ICD-10-CM

## 2015-09-06 DIAGNOSIS — M791 Myalgia, unspecified site: Secondary | ICD-10-CM

## 2015-09-06 MED ORDER — LEVOFLOXACIN 500 MG PO TABS: 500.0000 mg | ORAL_TABLET | Freq: Every day | ORAL | Status: DC

## 2015-09-21 ENCOUNTER — Ambulatory Visit (INDEPENDENT_AMBULATORY_CARE_PROVIDER_SITE_OTHER): Payer: Medicare Other | Admitting: Family Medicine

## 2015-09-21 VITALS — BP 126/82 | HR 80 | Temp 98.5°F | Resp 18 | Ht 64.5 in | Wt 286.0 lb

## 2015-09-21 DIAGNOSIS — M791 Myalgia, unspecified site: Secondary | ICD-10-CM

## 2015-09-21 LAB — POCT CBC
Granulocyte percent: 67.8 %G (ref 37–80)
HCT, POC: 36.3 % — AB (ref 37.7–47.9)
Hemoglobin: 13 g/dL (ref 12.2–16.2)
Lymph, poc: 2.2 (ref 0.6–3.4)
MCH, POC: 30.9 pg (ref 27–31.2)
MCHC: 35.8 g/dL — AB (ref 31.8–35.4)
MCV: 86.2 fL (ref 80–97)
MID (cbc): 0.5 (ref 0–0.9)
MPV: 7.8 fL (ref 0–99.8)
POC Granulocyte: 5.6 (ref 2–6.9)
POC LYMPH PERCENT: 26.2 %L (ref 10–50)
POC MID %: 6 %M (ref 0–12)
Platelet Count, POC: 245 10*3/uL (ref 142–424)
RBC: 4.21 M/uL (ref 4.04–5.48)
RDW, POC: 14 %
WBC: 8.3 10*3/uL (ref 4.6–10.2)

## 2015-09-21 LAB — COMPLETE METABOLIC PANEL WITH GFR
ALT: 14 U/L (ref 6–29)
AST: 14 U/L (ref 10–35)
Albumin: 4.3 g/dL (ref 3.6–5.1)
Alkaline Phosphatase: 43 U/L (ref 33–130)
BUN: 21 mg/dL (ref 7–25)
CO2: 24 mmol/L (ref 20–31)
Calcium: 9.9 mg/dL (ref 8.6–10.4)
Chloride: 105 mmol/L (ref 98–110)
Creat: 1.16 mg/dL — ABNORMAL HIGH (ref 0.50–1.05)
GFR, Est African American: 62 mL/min (ref >=60)
GFR, Est Non African American: 54 mL/min — ABNORMAL LOW (ref >=60)
Glucose, Bld: 147 mg/dL — ABNORMAL HIGH (ref 65–99)
Potassium: 5 mmol/L (ref 3.5–5.3)
Sodium: 138 mmol/L (ref 135–146)
Total Bilirubin: 0.3 mg/dL (ref 0.2–1.2)
Total Protein: 7.5 g/dL (ref 6.1–8.1)

## 2015-09-21 LAB — POCT SEDIMENTATION RATE: POCT SED RATE: 55 mm/hr — AB (ref 0–22)

## 2015-09-21 MED ORDER — PREDNISONE 20 MG PO TABS: ORAL_TABLET | ORAL | Status: DC

## 2015-09-21 NOTE — Patient Instructions (Addendum)
I am checking a set of rheumatological tests and RMSF titer.  We'll try prednisone for a couple days while the lab tests come back.

## 2015-09-21 NOTE — Progress Notes (Signed)
55 yo woman who had a bad cough that finally resolved after Levaquin.  The cough began one month ago, along with muscle soreness.  No known tick bite.  No cough at present.  No rash.  Myalgia has not responded to tylenol, advil, or other pain med. 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  Abdominal symptoms unchanged.  Objective: BP 126/82 mmHg  Pulse 80  Temp(Src) 98.5 F (36.9 C) (Oral)  Resp 18  Ht 5' 4.5" (1.638 m)  Wt 286 lb (129.729 kg)  BMI 48.35 kg/m2  SpO2 95% Oroph:  Clear Skin:  No rash Chest:  Clear Heart:  Reg, no murmur Extremities:  No red joints, possible mild swelling knees.  Patient is tearful.  Assessment:  Acute myalgia syndrome that began with a viral bronchitis  Plan:   Myalgia - Plan: POCT CBC, POCT SEDIMENTATION RATE, CK, [place] spotted fvr ab, IgM-blood, ANA, IFA Comprehensive Panel, Rheumatoid factor, predniSONE (DELTASONE) 20 MG tablet, COMPLETE METABOLIC PANEL WITH GFR  [name], MD

## 2015-09-22 LAB — ANA, IFA COMPREHENSIVE PANEL
Anti Nuclear Antibody(ANA): NEGATIVE
ENA SM Ab Ser-aCnc: <1
SM/RNP: <1
SSA (Ro) (ENA) Antibody, IgG: <1
SSB (La) (ENA) Antibody, IgG: <1
Scleroderma (Scl-70) (ENA) Antibody, IgG: <1
ds DNA Ab: <1 IU/mL

## 2015-09-22 LAB — CK: Total CK: 45 U/L (ref 7–177)

## 2015-09-22 LAB — RHEUMATOID FACTOR: Rhuematoid fact SerPl-aCnc: <10 IU/mL (ref <=14)

## 2015-09-23 LAB — ROCKY MTN SPOTTED FVR ABS PNL(IGG+IGM)
RMSF IgG: NOT DETECTED
RMSF IgM: NOT DETECTED

## 2015-09-26 ENCOUNTER — Other Ambulatory Visit: Payer: Self-pay | Admitting: Family Medicine

## 2015-09-26 DIAGNOSIS — M791 Myalgia, unspecified site: Secondary | ICD-10-CM

## 2015-09-26 DIAGNOSIS — R700 Elevated erythrocyte sedimentation rate: Secondary | ICD-10-CM

## 2015-10-13 ENCOUNTER — Ambulatory Visit (INDEPENDENT_AMBULATORY_CARE_PROVIDER_SITE_OTHER): Payer: Medicare Other | Admitting: Family Medicine

## 2015-10-13 ENCOUNTER — Other Ambulatory Visit: Payer: Self-pay | Admitting: Family Medicine

## 2015-10-13 VITALS — BP 126/84 | HR 84 | Temp 98.7°F | Resp 18 | Ht 64.5 in | Wt 278.0 lb

## 2015-10-13 DIAGNOSIS — M25579 Pain in unspecified ankle and joints of unspecified foot: Secondary | ICD-10-CM

## 2015-10-13 DIAGNOSIS — M7502 Adhesive capsulitis of left shoulder: Secondary | ICD-10-CM | POA: Diagnosis not present

## 2015-10-13 DIAGNOSIS — M791 Myalgia, unspecified site: Secondary | ICD-10-CM

## 2015-10-13 DIAGNOSIS — R700 Elevated erythrocyte sedimentation rate: Secondary | ICD-10-CM

## 2015-10-13 DIAGNOSIS — M2550 Pain in unspecified joint: Secondary | ICD-10-CM

## 2015-10-13 DIAGNOSIS — M542 Cervicalgia: Secondary | ICD-10-CM | POA: Diagnosis not present

## 2015-10-13 LAB — POCT CBC
Granulocyte percent: 63 %G (ref 37–80)
HCT, POC: 38 % (ref 37.7–47.9)
Hemoglobin: 13.6 g/dL (ref 12.2–16.2)
Lymph, poc: 2.2 (ref 0.6–3.4)
MCH, POC: 31 pg (ref 27–31.2)
MCHC: 35.7 g/dL — AB (ref 31.8–35.4)
MCV: 86.9 fL (ref 80–97)
MID (cbc): 0.4 (ref 0–0.9)
MPV: 8.5 fL (ref 0–99.8)
POC Granulocyte: 4.5 (ref 2–6.9)
POC LYMPH PERCENT: 31.2 %L (ref 10–50)
POC MID %: 5.8 %M (ref 0–12)
Platelet Count, POC: 270 10*3/uL (ref 142–424)
RBC: 4.37 M/uL (ref 4.04–5.48)
RDW, POC: 14.2 %
WBC: 7.1 10*3/uL (ref 4.6–10.2)

## 2015-10-13 LAB — POCT SEDIMENTATION RATE: POCT SED RATE: 58 mm/hr — AB (ref 0–22)

## 2015-10-13 MED ORDER — PREDNISONE 10 MG PO TABS: 30.0000 mg | ORAL_TABLET | Freq: Every day | ORAL | Status: DC

## 2015-10-13 NOTE — Patient Instructions (Addendum)
Let's keep in touch daily to monitor the course of the pain

## 2015-10-13 NOTE — Progress Notes (Signed)
On a Monday, March 6 I woke up with what I thought was a bad allergy attack. I took an extra antihistamine, and things were better the rest of the day. The next morning things were even worse than the day before. I felt rotten and stayed in bed most of the day.  The next day Wednesday, March 8 I started coughing, and my legs started aching which included the thighs as well as the lower legs. The legs aching and the cough continued with things not getting better.    That's when I came to see you on Tuesday, March 21. You then put me on Levaquin. On the last day of the Levaquin I was still coughing some and not feeling very well so I contacted you. You called in a refill of the Levaquin. During the time I was on the Levaquin, the leg pain didn't seem to be as bad. Of course I had been taking additional pain meds. The second round of Levaquin cleared up the coughing, but the leg pain continued. I had noticed some fluid retention in my hands and lower legs.  After I finished the Levaquin the leg pain started getting worse and spread to my hands and other joints. By Sunday, April 9 I could barely walk. On that day I had a very tough time walking around at church, getting up the sanctuary stairs and just simply standing to sing with the choir. When I got home I went to bed and stayed there the rest of the day as well as the next day. I was having trouble getting around my apartment, and standing in the kitchen long enough to cook something nutritional to eat was impossible. Basically, I alternated between the bed and my recliner for a day and a half.  The next day Tuesday, April 11 I came back to see you. I was still having trouble simply walking because of the pain. All of my joints felt like a they were inflamed inside and all of my muscles ached. You prescribed the prednisone which I started as soon as I got home around 10am. By 4-5pm I noticed an improvement in the fact I could actually stand up without wincing  in pain, but pain when walking and the underlying ache continued. Each of the 5 days I was on the prednisone the pattern continued. I would take the prednisone around 10am each morning and by 5pm I would be noticing improvement. I improved a little each day that I was on the prednisone. However, the 5 days of prednisone did not heal me. I was still aching some and had some pain when walking.  A few days after finishing the prednisone I started getting worse again, but mainly in just my legs. The prednisone seemed to really help the pain I had in my hands, and my hands have only hurt one day since finishing the prednisone. However, a couple times I had taken a single Lasix which did provide some relief for a few days. In other words, after taking the Lasix I went from from having difficulty walking to just walking with pain.  On this past Friday, April 28 I was hurting badly from the time I got up that morning. Since pain meds weren't really helping a lot, I decided to take another Lasix. By that evening I had a good amount of improvement, but was still dealing with a lot of pain. Yesterday at church I was again having difficulty walking, climbing the sanctuary stairs  and even though I was in the choir loft with the choir, I only stood when the choir sang our anthem. I had difficulty standing for the entire song, but I pushed through the pain to finish the last minute of singing. My legs ached but seemed to feel better anytime I applied someone thing warm. I did take another Lasix on Sunday morning, and I did get a little improvement late in the afternoon. However, this morning I'm still dealing with painful walking, aching legs, feet that appear to be a little swollen and hands that feel tight. Not walking and staying where I'm lying down or my legs are elevated seems to be the only way I get any improvement or at least keep the pain level down some. I wish I knew what is going on with this leg pain. Other than  the Tramadol reaction I've never had anything like this happen before.  I hope this info helps. If you want me to come in on Wednesday, please tell me what time you will be there. Again, thank you for all you do for me. I'm sorry I can't seem to have common illness that is easily treated.  ~Valerie [name]   Patient went to see Valerie [name] today for adjustment of her left shoulder  Significant negatives: No GI symptoms, eyes feel gritty, mouth shows no dryness   Objective:  BP 126/84 mmHg  Pulse 84  Temp(Src) 98.7 F (37.1 C) (Oral)  Resp 18  Ht 5' 4.5" (1.638 m)  Wt 278 lb (126.1 kg)  BMI 47.00 kg/m2  SpO2 96% Results for orders placed or performed in visit on 09/21/15  CK  Result Value Ref Range   Total CK 45 7 - 177 U/L  ANA, IFA Comprehensive Panel  Result Value Ref Range   Anit Nuclear Antibody(ANA) NEG NEGATIVE   ds DNA Ab <1 IU/mL   Scleroderma (Scl-70) (ENA) Antibody, IgG <1.0 NEG <1.0 NEG AI   SSA (Ro) (ENA) Antibody, IgG <1.0 NEG <1.0 NEG AI   SSB (La) (ENA) Antibody, IgG <1.0 NEG <1.0 NEG AI   ENA SM Ab Ser-aCnc <1.0 NEG <1.0 NEG AI   SM/RNP <1.0 NEG <1.0 NEG AI  Rheumatoid factor  Result Value Ref Range   Rhuematoid fact SerPl-aCnc <10 <=14 IU/mL  COMPLETE METABOLIC PANEL WITH GFR  Result Value Ref Range   Sodium 138 135 - 146 mmol/L   Potassium 5.0 3.5 - 5.3 mmol/L   Chloride 105 98 - 110 mmol/L   CO2 24 20 - 31 mmol/L   Glucose, Bld 147 (H) 65 - 99 mg/dL   BUN 21 7 - 25 mg/dL   Creat 1.16 (H) 0.50 - 1.05 mg/dL   Total Bilirubin 0.3 0.2 - 1.2 mg/dL   Alkaline Phosphatase 43 33 - 130 U/L   AST 14 10 - 35 U/L   ALT 14 6 - 29 U/L   Total Protein 7.5 6.1 - 8.1 g/dL   Albumin 4.3 3.6 - 5.1 g/dL   Calcium 9.9 8.6 - 10.4 mg/dL   GFR, Est African American 62 >=60 mL/min   GFR, Est Non African American 54 (L) >=60 mL/min  Rocky mtn spotted fvr abs pnl(IgG+IgM)  Result Value Ref Range   RMSF IgG NOT DETECTED NOT DETECTED   RMSF IgM NOT DETECTED NOT DETECTED  POCT  CBC  Result Value Ref Range   WBC 8.3 4.6 - 10.2 K/uL   Lymph, poc 2.2 0.6 - 3.4   POC LYMPH PERCENT  26.2 10 - 50 %L   MID (cbc) 0.5 0 - 0.9   POC MID % 6.0 0 - 12 %M   POC Granulocyte 5.6 2 - 6.9   Granulocyte percent 67.8 37 - 80 %G   RBC 4.21 4.04 - 5.48 M/uL   Hemoglobin 13.0 12.2 - 16.2 g/dL   HCT, POC 36.3 (A) 37.7 - 47.9 %   MCV 86.2 80 - 97 fL   MCH, POC 30.9 27 - 31.2 pg   MCHC 35.8 (A) 31.8 - 35.4 g/dL   RDW, POC 14.0 %   Platelet Count, POC 245 142 - 424 K/uL   MPV 7.8 0 - 99.8 fL  POCT SEDIMENTATION RATE  Result Value Ref Range   POCT SED RATE 55 (A) 0 - 22 mm/hr    Eyes show no injection Skin: No rash Extremities: Tender lower extremities diffusely with pressure, will trace edema. No effusions, good pedal pulses Chest: Clear Heart: Regular no murmur  Assessment: Arthritis type syndrome with no definite etiology identified. This may be a form of polymyalgia.  Pain in joint, ankle and foot, unspecified laterality - Plan: POCT SEDIMENTATION RATE, ANA, predniSONE (DELTASONE) 10 MG tablet, POCT CBC, CANCELED: POCT rapid strep A

## 2015-10-14 LAB — ANA: Anti Nuclear Antibody(ANA): NEGATIVE

## 2015-10-18 DIAGNOSIS — M542 Cervicalgia: Secondary | ICD-10-CM | POA: Diagnosis not present

## 2015-10-18 DIAGNOSIS — M7502 Adhesive capsulitis of left shoulder: Secondary | ICD-10-CM | POA: Diagnosis not present

## 2015-10-19 DIAGNOSIS — F329 Major depressive disorder, single episode, unspecified: Secondary | ICD-10-CM | POA: Diagnosis not present

## 2015-10-19 DIAGNOSIS — M79604 Pain in right leg: Secondary | ICD-10-CM | POA: Diagnosis not present

## 2015-10-19 DIAGNOSIS — F4310 Post-traumatic stress disorder, unspecified: Secondary | ICD-10-CM | POA: Diagnosis not present

## 2015-10-19 DIAGNOSIS — M461 Sacroiliitis, not elsewhere classified: Secondary | ICD-10-CM | POA: Diagnosis not present

## 2015-10-19 DIAGNOSIS — M79601 Pain in right arm: Secondary | ICD-10-CM | POA: Diagnosis not present

## 2015-10-19 DIAGNOSIS — M2550 Pain in unspecified joint: Secondary | ICD-10-CM | POA: Diagnosis not present

## 2015-10-19 DIAGNOSIS — F332 Major depressive disorder, recurrent severe without psychotic features: Secondary | ICD-10-CM | POA: Diagnosis not present

## 2015-10-19 DIAGNOSIS — E669 Obesity, unspecified: Secondary | ICD-10-CM | POA: Diagnosis not present

## 2015-10-19 DIAGNOSIS — M79605 Pain in left leg: Secondary | ICD-10-CM | POA: Diagnosis not present

## 2015-10-19 DIAGNOSIS — Z79891 Long term (current) use of opiate analgesic: Secondary | ICD-10-CM | POA: Diagnosis not present

## 2015-10-21 DIAGNOSIS — M542 Cervicalgia: Secondary | ICD-10-CM | POA: Diagnosis not present

## 2015-10-21 DIAGNOSIS — M7502 Adhesive capsulitis of left shoulder: Secondary | ICD-10-CM | POA: Diagnosis not present

## 2015-10-26 DIAGNOSIS — M542 Cervicalgia: Secondary | ICD-10-CM | POA: Diagnosis not present

## 2015-10-26 DIAGNOSIS — M7502 Adhesive capsulitis of left shoulder: Secondary | ICD-10-CM | POA: Diagnosis not present

## 2015-10-30 DIAGNOSIS — M542 Cervicalgia: Secondary | ICD-10-CM | POA: Diagnosis not present

## 2015-10-30 DIAGNOSIS — M7502 Adhesive capsulitis of left shoulder: Secondary | ICD-10-CM | POA: Diagnosis not present

## 2015-11-03 ENCOUNTER — Telehealth: Payer: Self-pay

## 2015-11-03 ENCOUNTER — Telehealth: Payer: Self-pay | Admitting: Neurology

## 2015-11-03 DIAGNOSIS — G4733 Obstructive sleep apnea (adult) (pediatric): Secondary | ICD-10-CM

## 2015-11-03 DIAGNOSIS — M542 Cervicalgia: Secondary | ICD-10-CM | POA: Diagnosis not present

## 2015-11-03 DIAGNOSIS — G4730 Sleep apnea, unspecified: Secondary | ICD-10-CM

## 2015-11-03 DIAGNOSIS — M7502 Adhesive capsulitis of left shoulder: Secondary | ICD-10-CM | POA: Diagnosis not present

## 2015-11-03 DIAGNOSIS — Z9989 Dependence on other enabling machines and devices: Secondary | ICD-10-CM

## 2015-11-03 NOTE — Telephone Encounter (Signed)
I called pt to discuss. No answer, left a message asking her to call me back.  [facility] should check the CPAP machine and make sure it is not broken and if if is broken, provide a loaner machine. Dr. Brett Fairy and Dr. Rexene Alberts are not in the office today to be able to generate a new order for a cpap machine.  Shirlean Mylar, do you have any other suggestions for this pt? I will reach out to [facility].

## 2015-11-03 NOTE — Telephone Encounter (Signed)
Spoke with [facility] from [facility]. She will contact patient and have her bring in machine to look at. It it needs sending off for repair they will give her a loaner to use.

## 2015-11-03 NOTE — Telephone Encounter (Signed)
I called Valerie [name]. She says that [facility] is refusing to give her a loaner CPAP. They will only give her a rental and it will be $92 which she can't afford. She is scared that her CPAP will stop working again. I advised her that our sleep lab manager spoke to [facility] and they said that they would give Valerie [name] a loaner machine. I encouraged Valerie [name] to keep in touch with [facility] regarding this. Valerie [name] is very worried and is asking that our sleep lab manager call her to discuss. Valerie [name] says she doesn't think that [facility] will give her a loaner machine. Valerie [name] thinks that the "media card " in her cpap is broken.  Shirlean Mylar, can you please call this Valerie [name] to discuss? Thanks!

## 2015-11-03 NOTE — Telephone Encounter (Signed)
Order pended

## 2015-11-03 NOTE — Telephone Encounter (Signed)
Pt called back and LMOM again to give info as to where to send order. Called pt and she stated that she needs to have the order for CPAP machine go to [place] [facility] [phone] today if at all possible, because she can not sleep without it. I have printed off copy of sleep study to send w/order (although pt stated [facility] should already have it). Pt states she only needs an order for the machine itself, [facility] already has order for supplies.

## 2015-11-03 NOTE — Telephone Encounter (Signed)
Authorized.

## 2015-11-03 NOTE — Telephone Encounter (Signed)
Patient is returning your call.  

## 2015-11-03 NOTE — Telephone Encounter (Signed)
Pt is needing a rx to get a new cpap machine -pt is very anxious about this since she has severe sleep apnea and needs this to go to sleep    Best number [phone]

## 2015-11-03 NOTE — Telephone Encounter (Signed)
Faxed order and copy of sleep study and titration to [facility] and notified pt done.

## 2015-11-03 NOTE — Telephone Encounter (Signed)
Judson Roch is covering Dr Lenn Cal box, but will be out for the next two days. Can someone else please sign order for pended CPAP machine?

## 2015-11-03 NOTE — Telephone Encounter (Signed)
Patient called, states last night her CPAP machine would not work, she called DME provider who advised, patient will need a new order for new CPAP machine, she "can't get a loaner", patient is tearful, states she had to sleep in recliner last night, "has severe sleep apnea, wants to be sure she is going to wake up".

## 2015-11-04 ENCOUNTER — Telehealth: Payer: Self-pay

## 2015-11-04 ENCOUNTER — Ambulatory Visit (INDEPENDENT_AMBULATORY_CARE_PROVIDER_SITE_OTHER): Payer: Medicare Other | Admitting: Family Medicine

## 2015-11-04 ENCOUNTER — Ambulatory Visit: Payer: Medicare Other | Admitting: Adult Health

## 2015-11-04 VITALS — BP 128/68 | HR 62 | Temp 98.4°F | Resp 18 | Ht 64.0 in | Wt 286.0 lb

## 2015-11-04 DIAGNOSIS — G4733 Obstructive sleep apnea (adult) (pediatric): Secondary | ICD-10-CM

## 2015-11-04 DIAGNOSIS — G4730 Sleep apnea, unspecified: Secondary | ICD-10-CM

## 2015-11-04 NOTE — Telephone Encounter (Signed)
I have been on the phone w/pt, [facility] and Dr Dohmeier's office today. A F2F OV is needed for documentation as to need for CPAP to be [facility] compliant. Called Dr Dohmeier's office, at req of our provider, to see if they can get pt in to see NP today there since she is est pt and this is their specialty. While waiting for CB, [facility] called and when I explained status, they said they would also contact their office. Called Robin back and LMOM since I had been given instr's to CB if I hadn't received call in 20 mins). I called pt to update status and she is on time constraint to get something done today, because she has appt at 4pm today. I advised her to come on in here.

## 2015-11-04 NOTE — Progress Notes (Signed)
Subjective:    Patient ID: Valerie [name], female    DOB: August 23, 1960, 55 y.o.   MRN: [id number]  HPI This is a pleasant 55 yo female who presents today for follow up of OSA and cpap. She has been using her machine for 7 years with good results. Her machine recently broke and she has been unable to use it. Her pressure setting is 12 cm/H2O. She uses every night and if she naps.   Past Medical History  Diagnosis Date  . Hypertension   . Anxiety   . Mental disorder   . Depression   . Sleep apnea   . Headache(784.0)   . Neuromuscular disorder ([place])   . 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 Fibromyalgia   . Arthritis     degenerative  . Anemia 05/01/2012  . Gastro-esophageal reflux   . Sleep apnea     CPAP-9  . Pancreatitis   . Diarrhea     EPISODIC  . Back pain     lower   Past Surgical History  Procedure Laterality Date  . Cholecystectomy    . Rotator cuff repair  bilateral  . Nasal septoplasty w/ turbinoplasty    . Esophagogastroduodenoscopy  12/21/2011    Procedure: ESOPHAGOGASTRODUODENOSCOPY (EGD);  Surgeon: Missy Sabins, MD;  Location: [facility] ENDOSCOPY;  Service: Endoscopy;  Laterality: N/A;  . Gallbladder surgery    . Flexible sigmoidoscopy  05/01/2012    Procedure: FLEXIBLE SIGMOIDOSCOPY;  Surgeon: Jeryl Columbia, MD;  Location: [facility] ENDOSCOPY;  Service: Endoscopy;  Laterality: N/A;  unprepped  . Biceps tendon repair      13  . Nasal sinus surgery    . Esophagogastroduodenoscopy N/A 11/06/2012    Procedure: ESOPHAGOGASTRODUODENOSCOPY (EGD);  Surgeon: Missy Sabins, MD;  Location: [facility] ENDOSCOPY;  Service: Endoscopy;  Laterality: N/A;  . Botox injection N/A 11/06/2012    Procedure: BOTOX INJECTION;  Surgeon: Missy Sabins, MD;  Location: [facility] ENDOSCOPY;  Service: Endoscopy;  Laterality: N/A;  . Esophagogastroduodenoscopy N/A 12/04/2013    Procedure: ESOPHAGOGASTRODUODENOSCOPY (EGD);  Surgeon: Missy Sabins, MD;  Location: [facility] ENDOSCOPY;  Service: Endoscopy;  Laterality: N/A;   . Botox injection N/A 12/04/2013    Procedure: BOTOX INJECTION;  Surgeon: Missy Sabins, MD;  Location: [place];  Service: Endoscopy;  Laterality: N/A;  . Esophagogastroduodenoscopy N/A 02/26/2014    Procedure: ESOPHAGOGASTRODUODENOSCOPY (EGD);  Surgeon: Missy Sabins, MD;  Location: [facility] ENDOSCOPY;  Service: Endoscopy;  Laterality: N/A;  . Botox injection N/A 02/26/2014    Procedure: BOTOX INJECTION;  Surgeon: Missy Sabins, MD;  Location: [place];  Service: Endoscopy;  Laterality: N/A;  . Esophagogastroduodenoscopy (egd) with propofol N/A 07/08/2015    Procedure: ESOPHAGOGASTRODUODENOSCOPY (EGD) WITH PROPOFOL;  Surgeon: Teena Irani, MD;  Location: [facility] ENDOSCOPY;  Service: Endoscopy;  Laterality: N/A;  . Botox injection N/A 07/08/2015    Procedure: BOTOX INJECTION;  Surgeon: Teena Irani, MD;  Location: [facility] ENDOSCOPY;  Service: Endoscopy;  Laterality: N/A;   Family History  Problem Relation Age of Onset  . Heart disease Mother   . COPD Mother   . Arthritis Mother   . Cancer Mother   . Hypertension Mother   . Arthritis Paternal Grandmother   . Heart disease Paternal Grandmother   . Heart disease Paternal Grandfather    Social History  Substance Use Topics  . Smoking status: Never Smoker   . Smokeless tobacco: Never Used  . Alcohol Use: No  Review of Systems No chest pain, no SOB, no cough    Objective:   Physical Exam Physical Exam  Constitutional: Oriented to person, place, and time. She appears well-developed and well-nourished.  HENT:  Head: Normocephalic and atraumatic.  Eyes: Conjunctivae are normal.  Neck: Normal range of motion. Neck supple.  Cardiovascular: Normal rate, regular rhythm and normal heart sounds.   Pulmonary/Chest: Effort normal and breath sounds normal.  Musculoskeletal: Normal range of motion.  Neurological: Alert and oriented to person, place, and time.  Skin: Skin is warm and dry.  Psychiatric: Normal mood and affect. Behavior is normal.  Judgment and thought content normal.  Vitals reviewed.       BP 128/68 mmHg  Pulse 62  Temp(Src) 98.4 F (36.9 C) (Oral)  Resp 18  Ht 5\' 4"  (1.626 m)  Wt 286 lb (129.729 kg)  BMI 49.07 kg/m2  SpO2 96% Wt Readings from Last 3 Encounters:  11/04/15 286 lb (129.729 kg)  10/13/15 278 lb (126.1 kg)  09/21/15 286 lb (129.729 kg)    Assessment & Plan:  1. Sleep apnea with use of continuous positive airway pressure (CPAP) - Patient requires continued use at same setting- 12 cm H20 - Follow up next month with Dr. Joseph Art as planned   Clarene Reamer, FNP-BC  [facility]  11/04/2015 2:58 PM

## 2015-11-04 NOTE — Telephone Encounter (Signed)
I copied the following from my documentation on message to Dr Dohmeier's office also from 5/24:     Dallas Schimke, RN at 11/04/2015 1:29 PM     Status: Signed       Expand All Collapse All   I have been on the phone w/pt, [facility] and Dr Dohmeier's office today. A F2F OV is needed for documentation as to need for CPAP to be MC compliant. Called Dr Dohmeier's office, at req of our provider, to see if they can get pt in to see NP today there since she is est pt and this is their specialty. While waiting for CB, [facility] called and when I explained status, they said they would also contact their office. Called Robin back and LMOM since I had been given instr's to CB if I hadn't received call in 20 mins). I called pt to update status and she is on time constraint to get something done today, because she has appt at 4pm today. I advised her to come on in here.       Pt came into 102 and Debbie saw her and added pressure setting to Rx, and also documented pt's use of CPAP in OV notes. I have faxed order, ov notes and sleep study/titration back to [facility].

## 2015-11-04 NOTE — Telephone Encounter (Signed)
Left patient message that Valerie [name] will see her today at 3 to script a new cpap machine. [facility] needs office notes and script to get her a new one.. I spoke to her this morning and her cpap worked last night. She is afraid it will stop working over weekend. I thought her PCP would send in a  Script but they did not. I left second message at 2:45 for her to call Monday morning and we would get her in.

## 2015-11-04 NOTE — Patient Instructions (Signed)
     IF you received an x-ray today, you will receive an invoice from [facility]. Please contact [facility] at [phone] with questions or concerns regarding your invoice.   IF you received labwork today, you will receive an invoice from [facility]. Please contact [facility] at [phone] with questions or concerns regarding your invoice.   Our billing staff will not be able to assist you with questions regarding bills from these companies.  You will be contacted with the lab results as soon as they are available. The fastest way to get your results is to activate your My Chart account. Instructions are located on the last page of this paperwork. If you have not heard from us regarding the results in 2 weeks, please contact this office.      

## 2015-11-05 ENCOUNTER — Other Ambulatory Visit: Payer: Self-pay | Admitting: *Deleted

## 2015-11-05 DIAGNOSIS — Z9109 Other allergy status, other than to drugs and biological substances: Secondary | ICD-10-CM

## 2015-11-05 MED ORDER — FLUTICASONE PROPIONATE 50 MCG/ACT NA SUSP: 2.0000 | Freq: Every day | NASAL | Status: DC

## 2015-11-09 NOTE — Telephone Encounter (Signed)
Pt was seen by Clarene Reamer, NP on 11/04/15. It appears that [facility] still needs an order with a pressure setting. The last ordered pressure setting is 12 cmw with 3 cm EPR by Dr. Brett Fairy.  I spoke to pt. Pt says that she say Carlean Purl, NP on 11/04/15 and their office took care of getting her a new cpap. I advised her that Dr. Brett Fairy can also place a new order for a cpap if needed, since Dr. Brett Fairy is the specialist following her for sleep apnea. Pt says that Carlean Purl, NP's office sent an order with settings for the cpap. I advised pt that she still has an appt on 12/30/15 with Dr. Brett Fairy for her cpap follow up, and so I asked pt if Carlean Purl, NP's office was going to follow the pt for her cpap now, or does she want Dr. Brett Fairy to follow her? Pt states "I haven't decided that yet. Your office did not try and help me at all in an urgent situation. No one from your office called me or Elwyn Reach back." I advised pt that I spoke to her on 11/03/2015 and advised her that [facility] should provide her a a loaner cpap since hers was broken. I then sent the message to our sleep lab manager, Shirlean Mylar, to discuss further with pt. I contacted [facility], spoke to [facility], and she said she would investigate getting pt a loaner cpap. Robin, sleep Management consultant, also called [facility] and spoke to [facility], who again advised [place] that they would investigate getting pt a loaner machine. I advised pt that myself and Dr. Brett Fairy were off on 11/04/2015, but it appears that Shirlean Mylar called the pt again on 11/04/15 and offered her an appt with Jinny Blossom, NP that day, since [facility] responded on 11/04/2015 saying that pt qualified for a new cpap machine, but she needs office visit notes that document pt's compliance on cpap. However, pt had not been seen in our office since 08/19/2014. Pt was supposed to follow up in March of 2017 but did not. At this point, pt states, "Your office couldn't give me an appt until July for an urgent issue. Why couldn't someone have told me that I  could have seen the NP sooner?" I advised pt that it appears Shirlean Mylar called pt on 11/04/2015 twice to offer pt an appt with Jinny Blossom, NP that same day. We didn't know that [facility] would need office visit notes for pt's cpap within the last 6 months until 11/04/15, which is when Shirlean Mylar called the pt. Pt states, "When your office called me I was already at another doctor's appt, I didn't have cell phone service, and I couldn't have made it to your office that soon." I can also see that Shirlean Mylar was able to speak to pt on 11/04/15 successfully and pt reported that her cpap worked the previous night. I apologized to the pt for this situation. Pt states "Your office did not try and help me and no one would call me back. I don't know whether I want to keep seeing Dr. Brett Fairy or not." I advised pt to please let us know if she wants Dr. Brett Fairy to continue following her for cpap, because if not, I will have to let [facility] know to direct questions/orders to Valier, NP's office. At this point, pt stated, "Whatever." and hung up the phone on me.  I will ask [facility] to please direct questions/orders to [place], NP's office until pt decides whether Dr. Brett Fairy will follow her for cpap.

## 2015-11-09 NOTE — Telephone Encounter (Signed)
-----   Message from Leeroy Bock sent at 11/04/2015 12:13 PM EDT ----- Vickie Epley, We have figured out that she does qualify for a new machine which we are trying to get for. In order to do so we need a detailed order with pressure settings and office notes with in the last 6 months that speak to her compliance of pap therapy and benefits from usage of pap therapy.  I have called the patients PCP to see if they can assist but must be closed for lunch because they are not answering.  I am combing through office notes in epic to see if anything there will work. She is continuing to call our office in tears and I am trying to help as fast as I can.  Do you have any info or recent notes on our end that might work or could you guys provide an order.  Until I speak to the PCP I am not sure how helpful they will be.   THanks! Mary     ----- Message -----    From: Lester [place], RN    Sent: 11/03/2015  10:38 AM      To: Kayren Eaves Ozimek  This pt called our office because her cpap is broken and [facility] won't look at it or provide a loaner... Can you sort this out? Thanks!

## 2015-11-10 ENCOUNTER — Telehealth: Payer: Self-pay

## 2015-11-10 NOTE — Telephone Encounter (Signed)
Spoke with patient again regarding cpap. Explained that this was a miscommunication between [facility], [place], Primary Physician and patient. Expressed my apologies and explained we are here to help her if she needs [place] in future. He may schedule appt in future.

## 2015-11-11 DIAGNOSIS — M542 Cervicalgia: Secondary | ICD-10-CM | POA: Diagnosis not present

## 2015-11-11 DIAGNOSIS — M7502 Adhesive capsulitis of left shoulder: Secondary | ICD-10-CM | POA: Diagnosis not present

## 2015-11-17 DIAGNOSIS — M542 Cervicalgia: Secondary | ICD-10-CM | POA: Diagnosis not present

## 2015-11-17 DIAGNOSIS — M7502 Adhesive capsulitis of left shoulder: Secondary | ICD-10-CM | POA: Diagnosis not present

## 2015-11-19 NOTE — Telephone Encounter (Signed)
I got a message from urgent medical care office, person name Pamala Hurry called stating that this patient needed a new CPAP machine, and need supporting documents. You may need to see this patient in the office for this.     I got a message from urgent medical care office, person name Pamala Hurry called stating that this patient needed a new CPAP machine, and need supporting documents. You may need to see this patient in the office for this.    Dr Jannifer Franklin got this message from the answering service. This patient has not responded to our phone calls and all that after Bailey Mech had made an appointment for her - The coming Monday. CD

## 2015-11-19 NOTE — Telephone Encounter (Signed)
Cyril Mourning Dinkins, RN  Asencion Partridge Deaglan Lile, MD           Shirlean Mylar and I have tried very hard to get this pt a new cpap with [facility]. Pt was very difficult to work with and would not return our calls when we tried to schedule her, and then claimed we did not try and help her at all. I can reach out to [facility] and see if this has been resolved.     11-19-15

## 2015-11-24 ENCOUNTER — Ambulatory Visit (INDEPENDENT_AMBULATORY_CARE_PROVIDER_SITE_OTHER): Payer: Medicare Other | Admitting: Family Medicine

## 2015-11-24 VITALS — BP 122/74 | HR 66 | Temp 98.2°F | Resp 18 | Ht 64.0 in | Wt 282.6 lb

## 2015-11-24 DIAGNOSIS — M791 Myalgia, unspecified site: Secondary | ICD-10-CM

## 2015-11-24 DIAGNOSIS — E790 Hyperuricemia without signs of inflammatory arthritis and tophaceous disease: Secondary | ICD-10-CM | POA: Diagnosis not present

## 2015-11-24 DIAGNOSIS — I10 Essential (primary) hypertension: Secondary | ICD-10-CM | POA: Diagnosis not present

## 2015-11-24 DIAGNOSIS — R5382 Chronic fatigue, unspecified: Secondary | ICD-10-CM | POA: Diagnosis not present

## 2015-11-24 DIAGNOSIS — M2550 Pain in unspecified joint: Secondary | ICD-10-CM

## 2015-11-24 DIAGNOSIS — J309 Allergic rhinitis, unspecified: Secondary | ICD-10-CM | POA: Diagnosis not present

## 2015-11-24 DIAGNOSIS — R21 Rash and other nonspecific skin eruption: Secondary | ICD-10-CM | POA: Diagnosis not present

## 2015-11-24 LAB — POCT CBC
Granulocyte percent: 58.1 %G (ref 37–80)
HCT, POC: 38.7 % (ref 37.7–47.9)
Hemoglobin: 13.7 g/dL (ref 12.2–16.2)
Lymph, poc: 3.5 — AB (ref 0.6–3.4)
MCH, POC: 30.9 pg (ref 27–31.2)
MCHC: 35.4 g/dL (ref 31.8–35.4)
MCV: 87.4 fL (ref 80–97)
MID (cbc): 0.4 (ref 0–0.9)
MPV: 8.3 fL (ref 0–99.8)
POC Granulocyte: 5.4 (ref 2–6.9)
POC LYMPH PERCENT: 37.8 %L (ref 10–50)
POC MID %: 4.1 %M (ref 0–12)
Platelet Count, POC: 281 10*3/uL (ref 142–424)
RBC: 4.43 M/uL (ref 4.04–5.48)
RDW, POC: 13.8 %
WBC: 9.3 10*3/uL (ref 4.6–10.2)

## 2015-11-24 LAB — POCT SEDIMENTATION RATE: POCT SED RATE: 41 mm/hr — AB (ref 0–22)

## 2015-11-24 MED ORDER — ALLOPURINOL 300 MG PO TABS: 150.0000 mg | ORAL_TABLET | Freq: Every day | ORAL | Status: DC

## 2015-11-24 MED ORDER — CLOTRIMAZOLE-BETAMETHASONE 1-0.05 % EX CREA: 1.0000 "application " | TOPICAL_CREAM | Freq: Two times a day (BID) | CUTANEOUS | Status: DC

## 2015-11-24 MED ORDER — SPIRONOLACTONE 100 MG PO TABS: 100.0000 mg | ORAL_TABLET | Freq: Every day | ORAL | Status: DC

## 2015-11-24 MED ORDER — MONTELUKAST SODIUM 10 MG PO TABS: ORAL_TABLET | ORAL | Status: DC

## 2015-11-24 NOTE — Addendum Note (Signed)
Addended by: Carter Kitten on: 11/24/2015 05:54 PM   Modules accepted: Orders

## 2015-11-24 NOTE — Patient Instructions (Addendum)
[employer]    IF you received an x-ray today, you will receive an invoice from [facility]. Please contact [facility] at [phone] with questions or concerns regarding your invoice.   IF you received labwork today, you will receive an invoice from [employer]. Please contact [facility] at [phone] with questions or concerns regarding your invoice.   Our billing staff will not be able to assist you with questions regarding bills from these companies.  You will be contacted with the lab results as soon as they are available. The fastest way to get your results is to activate your My Chart account. Instructions are located on the last page of this paperwork. If you have not heard from [place] regarding the results in 2 weeks, please contact this office.

## 2015-11-24 NOTE — Progress Notes (Signed)
Valerie [name] has continued to struggle with leg pains and muscle aches, along with extreme fatigue for 2 months and moodiness.  She has lost interest in choir   She did get her CPAP set up.  With the increased pain, her chronic pain clinic increased her pain medications which did not help at all and she has resumed her previous dose.  She cannot take SSRI's because it led to irritable bladder syndrome in the past.   Objective:  BP 122/74 mmHg  Pulse 66  Temp(Src) 98.2 F (36.8 C) (Oral)  Resp 18  Ht 5\' 4"  (1.626 m)  Wt 282 lb 9.6 oz (128.187 kg)  BMI 48.48 kg/m2  SpO2 96%   Tender large muscle groups. No pitting in nails No joint redness or swelling or deformity  Assessment: chronic arthritis type syndrome, possibly polyarthralgia  Plan:  Rheumatology appt on 21st.   Chronic fatigue - Plan: POCT CBC, POCT rapid strep A, COMPLETE METABOLIC PANEL WITH GFR, TSH  Arthralgia - Plan: POCT CBC, POCT rapid strep A, COMPLETE METABOLIC PANEL WITH GFR, TSH  Essential hypertension - Plan: spironolactone (ALDACTONE) 100 MG tablet  Allergic rhinitis, unspecified allergic rhinitis type - Plan: montelukast (SINGULAIR) 10 MG tablet  Hyperuricemia - Plan: allopurinol (ZYLOPRIM) 300 MG tablet  Myalgia - Plan: CK  [name], MD

## 2015-11-25 LAB — TSH: TSH: 2.62 mIU/L

## 2015-11-26 ENCOUNTER — Other Ambulatory Visit: Payer: Self-pay | Admitting: Family Medicine

## 2015-11-26 DIAGNOSIS — R7989 Other specified abnormal findings of blood chemistry: Secondary | ICD-10-CM

## 2015-11-26 LAB — COMPLETE METABOLIC PANEL WITH GFR
ALT: 12 U/L (ref 6–29)
AST: 13 U/L (ref 10–35)
Albumin: 4.5 g/dL (ref 3.6–5.1)
Alkaline Phosphatase: 45 U/L (ref 33–130)
BUN: 28 mg/dL — ABNORMAL HIGH (ref 7–25)
CO2: 18 mmol/L — ABNORMAL LOW (ref 20–31)
Calcium: 10.1 mg/dL (ref 8.6–10.4)
Chloride: 103 mmol/L (ref 98–110)
Creat: 1.27 mg/dL — ABNORMAL HIGH (ref 0.50–1.05)
GFR, Est African American: 56 mL/min — ABNORMAL LOW (ref >=60)
GFR, Est Non African American: 48 mL/min — ABNORMAL LOW (ref >=60)
Glucose, Bld: 81 mg/dL (ref 65–99)
Potassium: 5.1 mmol/L (ref 3.5–5.3)
Sodium: 136 mmol/L (ref 135–146)
Total Bilirubin: 0.3 mg/dL (ref 0.2–1.2)
Total Protein: 7.6 g/dL (ref 6.1–8.1)

## 2015-11-26 LAB — CK: Total CK: 53 U/L (ref 7–177)

## 2015-12-01 DIAGNOSIS — M2550 Pain in unspecified joint: Secondary | ICD-10-CM | POA: Diagnosis not present

## 2015-12-01 DIAGNOSIS — E790 Hyperuricemia without signs of inflammatory arthritis and tophaceous disease: Secondary | ICD-10-CM | POA: Diagnosis not present

## 2015-12-01 DIAGNOSIS — L409 Psoriasis, unspecified: Secondary | ICD-10-CM | POA: Diagnosis not present

## 2015-12-01 DIAGNOSIS — M797 Fibromyalgia: Secondary | ICD-10-CM | POA: Diagnosis not present

## 2015-12-01 DIAGNOSIS — M5136 Other intervertebral disc degeneration, lumbar region: Secondary | ICD-10-CM | POA: Diagnosis not present

## 2015-12-01 DIAGNOSIS — N183 Chronic kidney disease, stage 3 (moderate): Secondary | ICD-10-CM | POA: Diagnosis not present

## 2015-12-27 ENCOUNTER — Telehealth: Payer: Self-pay | Admitting: Urgent Care

## 2015-12-27 ENCOUNTER — Ambulatory Visit (INDEPENDENT_AMBULATORY_CARE_PROVIDER_SITE_OTHER): Payer: Medicare Other | Admitting: Urgent Care

## 2015-12-27 ENCOUNTER — Encounter (HOSPITAL_COMMUNITY): Payer: Self-pay

## 2015-12-27 ENCOUNTER — Ambulatory Visit (HOSPITAL_COMMUNITY)
Admission: RE | Admit: 2015-12-27 | Discharge: 2015-12-27 | Disposition: A | Discharge status: Home / Self Care | Payer: Medicare Other | Source: Ambulatory Visit | Attending: Urgent Care | Admitting: Urgent Care

## 2015-12-27 ENCOUNTER — Other Ambulatory Visit: Payer: Self-pay | Admitting: Urgent Care

## 2015-12-27 VITALS — BP 126/82 | HR 134 | Temp 98.2°F | Resp 18 | Ht 64.0 in | Wt 283.4 lb

## 2015-12-27 DIAGNOSIS — R410 Disorientation, unspecified: Secondary | ICD-10-CM

## 2015-12-27 DIAGNOSIS — R400 Somnolence: Secondary | ICD-10-CM | POA: Insufficient documentation

## 2015-12-27 DIAGNOSIS — G894 Chronic pain syndrome: Secondary | ICD-10-CM | POA: Diagnosis not present

## 2015-12-27 DIAGNOSIS — E860 Dehydration: Secondary | ICD-10-CM | POA: Diagnosis not present

## 2015-12-27 DIAGNOSIS — R8290 Unspecified abnormal findings in urine: Secondary | ICD-10-CM | POA: Diagnosis not present

## 2015-12-27 LAB — POC MICROSCOPIC URINALYSIS (UMFC): MUCUS RE: ABSENT

## 2015-12-27 LAB — POCT URINALYSIS DIP (MANUAL ENTRY)
Glucose, UA: NEGATIVE
Nitrite, UA: NEGATIVE
PH UA: 5
SPEC GRAV UA: 1.02
UROBILINOGEN UA: 0.2

## 2015-12-27 LAB — POCT CBC
GRANULOCYTE PERCENT: 77.8 % (ref 37–80)
HCT, POC: 33.5 % — AB (ref 37.7–47.9)
HEMOGLOBIN: 11.7 g/dL — AB (ref 12.2–16.2)
Lymph, poc: 2.1 (ref 0.6–3.4)
MCH: 31.1 pg (ref 27–31.2)
MCHC: 34.9 g/dL (ref 31.8–35.4)
MCV: 89.1 fL (ref 80–97)
MID (cbc): 1 — AB (ref 0–0.9)
MPV: 8.5 fL (ref 0–99.8)
PLATELET COUNT, POC: 269 10*3/uL (ref 142–424)
POC Granulocyte: 10.7 — AB (ref 2–6.9)
POC LYMPH %: 15.2 % (ref 10–50)
POC MID %: 7 %M (ref 0–12)
RBC: 3.76 M/uL — AB (ref 4.04–5.48)
RDW, POC: 14.5 %
WBC: 13.7 10*3/uL — AB (ref 4.6–10.2)

## 2015-12-27 MED ORDER — CIPROFLOXACIN HCL 500 MG PO TABS: 500.0000 mg | ORAL_TABLET | Freq: Two times a day (BID) | ORAL | Status: DC

## 2015-12-27 MED ORDER — CEFTRIAXONE SODIUM 1 G IJ SOLR
1.0000 g | Freq: Once | INTRAMUSCULAR | Status: AC
  Administered 2015-12-27: 1 g via INTRAMUSCULAR
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
## 2015-12-27 NOTE — Telephone Encounter (Signed)
Advised aggressive hydration, start Cipro. RTC in 5 days if no improvement or sooner if worse.

## 2015-12-27 NOTE — Patient Instructions (Addendum)
Go over to [facility] now for your CT scan.  Enter thru the Main entrance, go to admitting, let them know that you are there for CT scan.  Confusion Confusion is the inability to think with your usual speed or clarity. Confusion may come on quickly or slowly over time. How quickly the confusion comes on depends on the cause. Confusion can be due to any number of causes. CAUSES   Concussion, head injury, or head trauma.  Seizures.  Stroke.  Fever.  Brain tumor.  Age related decreased brain function (dementia).  Heightened emotional states like rage or terror.  Mental illness in which the person loses the ability to determine what is real and what is not (hallucinations).  Infections such as a urinary tract infection (UTI).  Toxic effects from alcohol, drugs, or prescription medicines.  Dehydration and an imbalance of salts in the body (electrolytes).  Lack of sleep.  Low blood sugar (diabetes).  Low levels of oxygen from conditions such as chronic lung disorders.  Drug interactions or other medicine side effects.  Nutritional deficiencies, especially niacin, thiamine, vitamin C, or vitamin B.  Sudden drop in body temperature (hypothermia).  Change in routine, such as when traveling or hospitalized. SIGNS AND SYMPTOMS  People often describe their thinking as cloudy or unclear when they are confused. Confusion can also include feeling disoriented. That means you are unaware of where or who you are. You may also not know what the date or time is. If confused, you may also have difficulty paying attention, remembering, and making decisions. Some people also act aggressively when they are confused.  DIAGNOSIS  The medical evaluation of confusion may include:  Blood and urine tests.  X-rays.  Brain and nervous system tests.  Analyzing your brain waves (electroencephalogram or EEG).  Magnetic resonance imaging (MRI) of your head.  Computed tomography (CT) scan of your  head.  Mental status tests in which your health care provider may ask many questions. Some of these questions may seem silly or strange, but they are a very important test to help diagnose and treat confusion. TREATMENT  An admission to the hospital may not be needed, but a person with confusion should not be left alone. Stay with a family member or friend until the confusion clears. Avoid alcohol, pain relievers, or sedative drugs until you have fully recovered. Do not drive until directed by your health care provider. HOME CARE INSTRUCTIONS  What family and friends can do:  To find out if someone is confused, ask the person to state his or her name, age, and the date. If the person is unsure or answers incorrectly, he or she is confused.  Always introduce yourself, no matter how well the person knows you.  Often remind the person of his or her location.  Place a calendar and clock near the confused person.  Help the person with his or her medicines. You may want to use a pill box, an alarm as a reminder, or give the person each dose as prescribed.  Talk about current events and plans for the day.  Try to keep the environment calm, quiet, and peaceful.  Make sure the person keeps follow-up visits with his or her health care provider. PREVENTION  Ways to prevent confusion:  Avoid alcohol.  Eat a balanced diet.  Get enough sleep.  Take medicine only as directed by your health care provider.  Do not become isolated. Spend time with other people and make plans for your  days.  Keep careful watch on your blood sugar levels if you are diabetic. SEEK IMMEDIATE MEDICAL CARE IF:   You develop severe headaches, repeated vomiting, seizures, blackouts, or slurred speech.  There is increasing confusion, weakness, numbness, restlessness, or personality changes.  You develop a loss of balance, have marked dizziness, feel uncoordinated, or fall.  You have delusions, hallucinations, or  develop severe anxiety.  Your family members think you need to be rechecked.   This information is not intended to replace advice given to you by your health care provider. Make sure you discuss any questions you have with your health care provider.   Document Released: 07/06/2004 Document Revised: 06/19/2014 Document Reviewed: 07/04/2013 Elsevier Interactive Patient Education 2016 Elsevier Inc.     Pyelonephritis, Adult Pyelonephritis is a kidney infection. The kidneys are the organs that filter a person's blood and move waste out of the bloodstream and into the urine. Urine passes from the kidneys, through the ureters, and into the bladder. There are two main types of pyelonephritis:  Infections that come on quickly without any warning (acute pyelonephritis).  Infections that last for a long period of time (chronic pyelonephritis). In most cases, the infection clears up with treatment and does not cause further problems. More severe infections or chronic infections can sometimes spread to the bloodstream or lead to other problems with the kidneys. CAUSES This condition is usually caused by:  Bacteria traveling from the bladder to the kidney through infected urine. The urine in the bladder can become infected with bacteria from:  Bladder infection (cystitis).  Inflammation of the prostate gland (prostatitis).  Sexual intercourse, in females.  Bacteria traveling from the bloodstream to the kidney. RISK FACTORS This condition is more likely to develop in:  Pregnant women.  Older people.  People who have diabetes.  People who have kidney stones or bladder stones.  People who have other abnormalities of the kidney or ureter.  People who have a catheter placed in the bladder.  People who have cancer.  People who are sexually active.  Women who use spermicides.  People who have had a prior urinary tract infection. SYMPTOMS Symptoms of this condition  include:  Frequent urination.  Strong or persistent urge to urinate.  Burning or stinging when urinating.  Abdominal pain.  Back pain.  Pain in the side or flank area.  Fever.  Chills.  Blood in the urine, or dark urine.  Nausea.  Vomiting. DIAGNOSIS This condition may be diagnosed based on:  Medical history and physical exam.  Urine tests.  Blood tests. You may also have imaging tests of the kidneys, such as an ultrasound or CT scan. TREATMENT Treatment for this condition may depend on the severity of the infection.  If the infection is mild and is found early, you may be treated with antibiotic medicines taken by mouth. You will need to drink fluids to remain hydrated.  If the infection is more severe, you may need to stay in the hospital and receive antibiotics given directly into a vein through an IV tube. You may also need to receive fluids through an IV tube if you are not able to remain hydrated. After your hospital stay, you may need to take oral antibiotics for a period of time. Other treatments may be required, depending on the cause of the infection. HOME CARE INSTRUCTIONS Medicines  Take over-the-counter and prescription medicines only as told by your health care provider.  If you were prescribed an antibiotic medicine, take it  as told by your health care provider. Do not stop taking the antibiotic even if you start to feel better. General Instructions  Drink enough fluid to keep your urine clear or pale yellow.  Avoid caffeine, tea, and carbonated beverages. They tend to irritate the bladder.  Urinate often. Avoid holding in urine for long periods of time.  Urinate before and after sex.  After a bowel movement, women should cleanse from front to back. Use each tissue only once.  Keep all follow-up visits as told by your health care provider. This is important. SEEK MEDICAL CARE IF:  Your symptoms do not get better after 2 days of  treatment.  Your symptoms get worse.  You have a fever. SEEK IMMEDIATE MEDICAL CARE IF:  You are unable to take your antibiotics or fluids.  You have shaking chills.  You vomit.  You have severe flank or back pain.  You have extreme weakness or fainting.   This information is not intended to replace advice given to you by your health care provider. Make sure you discuss any questions you have with your health care provider.   Document Released: 05/29/2005 Document Revised: 02/17/2015 Document Reviewed: 09/21/2014 Elsevier Interactive Patient Education 2016 [employer].     IF you received an x-ray today, you will receive an invoice from [facility]. Please contact [facility] at [phone] with questions or concerns regarding your invoice.   IF you received labwork today, you will receive an invoice from [employer]. Please contact [facility] at [phone] with questions or concerns regarding your invoice.   Our billing staff will not be able to assist you with questions regarding bills from these companies.  You will be contacted with the lab results as soon as they are available. The fastest way to get your results is to activate your My Chart account. Instructions are located on the last page of this paperwork. If you have not heard from [place] regarding the results in 2 weeks, please contact this office.

## 2015-12-28 ENCOUNTER — Observation Stay (HOSPITAL_COMMUNITY): Payer: Medicare Other

## 2015-12-28 ENCOUNTER — Telehealth: Payer: Self-pay

## 2015-12-28 ENCOUNTER — Encounter (HOSPITAL_COMMUNITY): Payer: Self-pay | Admitting: Emergency Medicine

## 2015-12-28 ENCOUNTER — Observation Stay (HOSPITAL_COMMUNITY)
Admission: EM | Admit: 2015-12-28 | Discharge: 2015-12-29 | Disposition: A | Discharge status: Home / Self Care | Payer: Medicare Other | Attending: Family Medicine | Admitting: Family Medicine

## 2015-12-28 DIAGNOSIS — R410 Disorientation, unspecified: Secondary | ICD-10-CM

## 2015-12-28 DIAGNOSIS — R799 Abnormal finding of blood chemistry, unspecified: Secondary | ICD-10-CM | POA: Insufficient documentation

## 2015-12-28 DIAGNOSIS — I10 Essential (primary) hypertension: Secondary | ICD-10-CM | POA: Insufficient documentation

## 2015-12-28 DIAGNOSIS — M25562 Pain in left knee: Secondary | ICD-10-CM | POA: Diagnosis not present

## 2015-12-28 DIAGNOSIS — G4733 Obstructive sleep apnea (adult) (pediatric): Secondary | ICD-10-CM | POA: Insufficient documentation

## 2015-12-28 DIAGNOSIS — N183 Chronic kidney disease, stage 3 unspecified: Secondary | ICD-10-CM | POA: Insufficient documentation

## 2015-12-28 DIAGNOSIS — R000 Tachycardia, unspecified: Secondary | ICD-10-CM | POA: Diagnosis not present

## 2015-12-28 DIAGNOSIS — N179 Acute kidney failure, unspecified: Secondary | ICD-10-CM | POA: Diagnosis not present

## 2015-12-28 DIAGNOSIS — M25569 Pain in unspecified knee: Secondary | ICD-10-CM | POA: Insufficient documentation

## 2015-12-28 DIAGNOSIS — R400 Somnolence: Secondary | ICD-10-CM

## 2015-12-28 DIAGNOSIS — R4182 Altered mental status, unspecified: Secondary | ICD-10-CM | POA: Diagnosis present

## 2015-12-28 DIAGNOSIS — G894 Chronic pain syndrome: Secondary | ICD-10-CM | POA: Insufficient documentation

## 2015-12-28 DIAGNOSIS — S8992XA Unspecified injury of left lower leg, initial encounter: Secondary | ICD-10-CM | POA: Diagnosis not present

## 2015-12-28 DIAGNOSIS — Z79899 Other long term (current) drug therapy: Secondary | ICD-10-CM | POA: Diagnosis not present

## 2015-12-28 HISTORY — DX: Polymyalgia rheumatica: M35.3

## 2015-12-28 HISTORY — DX: Acute kidney failure, unspecified: N17.9

## 2015-12-28 HISTORY — DX: Pneumonia, unspecified organism: J18.9

## 2015-12-28 HISTORY — DX: Other intervertebral disc degeneration, lumbar region: M51.36

## 2015-12-28 HISTORY — DX: Atrioventricular block, first degree: I44.0

## 2015-12-28 HISTORY — DX: Other intervertebral disc degeneration, lumbar region without mention of lumbar back pain or lower extremity pain: M51.369

## 2015-12-28 HISTORY — DX: Acute (reversible) ischemia of large intestine, extent unspecified: K55.039

## 2015-12-28 HISTORY — DX: Melanoma in situ, unspecified: D03.9

## 2015-12-28 HISTORY — DX: Basal cell carcinoma of skin of other part of trunk: C44.519

## 2015-12-28 HISTORY — DX: Unspecified asthma, uncomplicated: J45.909

## 2015-12-28 LAB — RAPID URINE DRUG SCREEN, HOSP PERFORMED
AMPHETAMINES: NOT DETECTED
BARBITURATES: NOT DETECTED
BENZODIAZEPINES: POSITIVE — AB
COCAINE: NOT DETECTED
OPIATES: POSITIVE — AB
TETRAHYDROCANNABINOL: NOT DETECTED

## 2015-12-28 LAB — CBC
HCT: 34.6 % — ABNORMAL LOW (ref 36.0–46.0)
Hemoglobin: 11.1 g/dL — ABNORMAL LOW (ref 12.0–15.0)
MCH: 29.6 pg (ref 26.0–34.0)
MCHC: 32.1 g/dL (ref 30.0–36.0)
MCV: 92.3 fL (ref 78.0–100.0)
PLATELETS: 260 10*3/uL (ref 150–400)
RBC: 3.75 MIL/uL — AB (ref 3.87–5.11)
RDW: 13.9 % (ref 11.5–15.5)
WBC: 7.4 10*3/uL (ref 4.0–10.5)

## 2015-12-28 LAB — COMPREHENSIVE METABOLIC PANEL
ALT: 20 U/L (ref 6–29)
AST: 46 U/L — ABNORMAL HIGH (ref 10–35)
Albumin: 4.4 g/dL (ref 3.6–5.1)
Alkaline Phosphatase: 36 U/L (ref 33–130)
BUN: 110 mg/dL — AB (ref 7–25)
CHLORIDE: 106 mmol/L (ref 98–110)
CO2: 16 mmol/L — ABNORMAL LOW (ref 20–31)
CREATININE: 5.58 mg/dL — AB (ref 0.50–1.05)
Calcium: 9.2 mg/dL (ref 8.6–10.4)
GLUCOSE: 116 mg/dL — AB (ref 65–99)
POTASSIUM: 5.4 mmol/L — AB (ref 3.5–5.3)
SODIUM: 139 mmol/L (ref 135–146)
Total Bilirubin: 0.3 mg/dL (ref 0.2–1.2)
Total Protein: 7.3 g/dL (ref 6.1–8.1)

## 2015-12-28 LAB — CBC WITH DIFFERENTIAL/PLATELET
Basophils Absolute: 0 10*3/uL (ref 0.0–0.1)
Basophils Relative: 0 %
EOS PCT: 1 %
Eosinophils Absolute: 0.1 10*3/uL (ref 0.0–0.7)
HCT: 36.7 % (ref 36.0–46.0)
Hemoglobin: 11.9 g/dL — ABNORMAL LOW (ref 12.0–15.0)
LYMPHS ABS: 1.3 10*3/uL (ref 0.7–4.0)
LYMPHS PCT: 14 %
MCH: 30.1 pg (ref 26.0–34.0)
MCHC: 32.4 g/dL (ref 30.0–36.0)
MCV: 92.9 fL (ref 78.0–100.0)
MONO ABS: 0.6 10*3/uL (ref 0.1–1.0)
MONOS PCT: 6 %
Neutro Abs: 6.8 10*3/uL (ref 1.7–7.7)
Neutrophils Relative %: 79 %
PLATELETS: 253 10*3/uL (ref 150–400)
RBC: 3.95 MIL/uL (ref 3.87–5.11)
RDW: 13.9 % (ref 11.5–15.5)
WBC: 8.8 10*3/uL (ref 4.0–10.5)

## 2015-12-28 LAB — I-STAT ARTERIAL BLOOD GAS, ED
ACID-BASE DEFICIT: 7 mmol/L — AB (ref 0.0–2.0)
BICARBONATE: 17.5 meq/L — AB (ref 20.0–24.0)
O2 SAT: 95 %
PH ART: 7.345 — AB (ref 7.350–7.450)
PO2 ART: 77 mmHg — AB (ref 80.0–100.0)
TCO2: 18 mmol/L (ref 0–100)
pCO2 arterial: 32.1 mmHg — ABNORMAL LOW (ref 35.0–45.0)

## 2015-12-28 LAB — URINALYSIS, ROUTINE W REFLEX MICROSCOPIC
Bilirubin Urine: NEGATIVE
GLUCOSE, UA: NEGATIVE mg/dL
KETONES UR: 40 mg/dL — AB
Nitrite: NEGATIVE
PH: 5.5 (ref 5.0–8.0)
PROTEIN: NEGATIVE mg/dL
Specific Gravity, Urine: 1.016 (ref 1.005–1.030)

## 2015-12-28 LAB — TSH: TSH: 0.291 u[IU]/mL — ABNORMAL LOW (ref 0.350–4.500)

## 2015-12-28 LAB — CREATININE, SERUM
CREATININE: 1.82 mg/dL — AB (ref 0.44–1.00)
GFR calc Af Amer: 35 mL/min — ABNORMAL LOW (ref >60)
GFR calc non Af Amer: 31 mL/min — ABNORMAL LOW (ref >60)

## 2015-12-28 LAB — CK: Total CK: 1761 U/L — ABNORMAL HIGH (ref 38–234)

## 2015-12-28 LAB — URINE MICROSCOPIC-ADD ON

## 2015-12-28 LAB — BASIC METABOLIC PANEL
Anion gap: 13 (ref 5–15)
BUN: 68 mg/dL — AB (ref 6–20)
CALCIUM: 9.5 mg/dL (ref 8.9–10.3)
CO2: 17 mmol/L — AB (ref 22–32)
Chloride: 109 mmol/L (ref 101–111)
Creatinine, Ser: 2.53 mg/dL — ABNORMAL HIGH (ref 0.44–1.00)
GFR calc Af Amer: 24 mL/min — ABNORMAL LOW (ref >60)
GFR, EST NON AFRICAN AMERICAN: 21 mL/min — AB (ref >60)
GLUCOSE: 128 mg/dL — AB (ref 65–99)
Potassium: 5 mmol/L (ref 3.5–5.1)
Sodium: 139 mmol/L (ref 135–145)

## 2015-12-28 LAB — SODIUM, URINE, RANDOM: Sodium, Ur: 82 mmol/L

## 2015-12-28 LAB — CREATININE, URINE, RANDOM: Creatinine, Urine: 71.3 mg/dL

## 2015-12-28 LAB — VITAMIN B12: Vitamin B-12: 946 pg/mL — ABNORMAL HIGH (ref 180–914)

## 2015-12-28 MED ORDER — SODIUM CHLORIDE 0.9 % IV SOLN
INTRAVENOUS | Status: DC
  Administered 2015-12-28: 14:00:00 via INTRAVENOUS

## 2015-12-28 MED ORDER — THIAMINE HCL 100 MG/ML IJ SOLN
100.0000 mg | Freq: Every day | INTRAMUSCULAR | Status: DC
  Administered 2015-12-29: 100 mg via INTRAVENOUS
  Filled 2015-12-28: qty 2

## 2015-12-28 MED ORDER — SODIUM CHLORIDE 0.9 % IV SOLN
INTRAVENOUS | Status: AC
  Administered 2015-12-28 – 2015-12-29 (×2): via INTRAVENOUS

## 2015-12-28 MED ORDER — BACLOFEN 10 MG PO TABS
10.0000 mg | ORAL_TABLET | Freq: Three times a day (TID) | ORAL | Status: DC
  Administered 2015-12-28 – 2015-12-29 (×3): 10 mg via ORAL
  Filled 2015-12-28 (×3): qty 1

## 2015-12-28 MED ORDER — LORAZEPAM 1 MG PO TABS
1.0000 mg | ORAL_TABLET | Freq: Four times a day (QID) | ORAL | Status: DC | PRN
  Administered 2015-12-29: 1 mg via ORAL
  Filled 2015-12-28: qty 1

## 2015-12-28 MED ORDER — MONTELUKAST SODIUM 10 MG PO TABS
10.0000 mg | ORAL_TABLET | Freq: Every day | ORAL | Status: DC
  Administered 2015-12-28: 10 mg via ORAL
  Filled 2015-12-28: qty 1

## 2015-12-28 MED ORDER — ADULT MULTIVITAMIN W/MINERALS CH
1.0000 | ORAL_TABLET | Freq: Every day | ORAL | Status: DC
  Administered 2015-12-28 – 2015-12-29 (×2): 1 via ORAL
  Filled 2015-12-28 (×2): qty 1

## 2015-12-28 MED ORDER — SODIUM CHLORIDE 0.9% FLUSH
3.0000 mL | Freq: Two times a day (BID) | INTRAVENOUS | Status: DC
  Administered 2015-12-28 – 2015-12-29 (×2): 3 mL via INTRAVENOUS

## 2015-12-28 MED ORDER — HEPARIN SODIUM (PORCINE) 5000 UNIT/ML IJ SOLN
5000.0000 [IU] | Freq: Three times a day (TID) | INTRAMUSCULAR | Status: DC
  Administered 2015-12-28 – 2015-12-29 (×3): 5000 [IU] via SUBCUTANEOUS
  Filled 2015-12-28 (×3): qty 1

## 2015-12-28 MED ORDER — VITAMIN B-1 100 MG PO TABS
100.0000 mg | ORAL_TABLET | Freq: Every day | ORAL | Status: DC
  Administered 2015-12-28: 100 mg via ORAL
  Filled 2015-12-28 (×2): qty 1

## 2015-12-28 MED ORDER — ALLOPURINOL 300 MG PO TABS
150.0000 mg | ORAL_TABLET | Freq: Every day | ORAL | Status: DC
  Administered 2015-12-28 – 2015-12-29 (×2): 150 mg via ORAL
  Filled 2015-12-28 (×2): qty 1

## 2015-12-28 MED ORDER — SODIUM CHLORIDE 0.9 % IV BOLUS (SEPSIS)
1000.0000 mL | Freq: Once | INTRAVENOUS | Status: AC
  Administered 2015-12-28: 1000 mL via INTRAVENOUS

## 2015-12-28 MED ORDER — FOLIC ACID 1 MG PO TABS
1.0000 mg | ORAL_TABLET | Freq: Every day | ORAL | Status: DC
  Administered 2015-12-28 – 2015-12-29 (×2): 1 mg via ORAL
  Filled 2015-12-28 (×2): qty 1

## 2015-12-28 MED ORDER — METOPROLOL SUCCINATE ER 100 MG PO TB24
100.0000 mg | ORAL_TABLET | Freq: Every day | ORAL | Status: DC
  Administered 2015-12-28 – 2015-12-29 (×2): 100 mg via ORAL
  Filled 2015-12-28 (×2): qty 1

## 2015-12-28 MED ORDER — PANTOPRAZOLE SODIUM 40 MG PO TBEC
40.0000 mg | DELAYED_RELEASE_TABLET | Freq: Every day | ORAL | Status: DC
  Administered 2015-12-28 – 2015-12-29 (×2): 40 mg via ORAL
  Filled 2015-12-28 (×2): qty 1

## 2015-12-28 MED ORDER — CIPROFLOXACIN HCL 500 MG PO TABS
500.0000 mg | ORAL_TABLET | Freq: Two times a day (BID) | ORAL | Status: DC
  Administered 2015-12-28 – 2015-12-29 (×3): 500 mg via ORAL
  Filled 2015-12-28 (×3): qty 1

## 2015-12-28 MED ORDER — LORAZEPAM 2 MG/ML IJ SOLN: 1.0000 mg | Freq: Four times a day (QID) | INTRAMUSCULAR | Status: DC | PRN
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
## 2015-12-28 NOTE — Progress Notes (Signed)
Admission note:  Arrival Method: Patient arrived from ED on a stretcher accomanied by the staff.  Mental Orientation:  Alert and oriented x 4. Telemetry: 6E-11 NSR,  CCMD notified. Assessment: See doc flow sheets. Skin: Assessed by two nurses ([place]) dry, warm and intact. IV: Right IV NS 119ml/hr. Pain: denies any pain currently. Tubes: N/A Safety Measures: Bed in low position, call bell and phone within reach. Fall Prevention Safety Plan: Reviewed the plan, understood and acknowledged. Admission Screening: In progress. 6700 Orientation: Patient has been oriented to the unit, staff and to the room.

## 2015-12-28 NOTE — Telephone Encounter (Signed)
Spoke with Joy who agreed to take Valerie [name] to [facility] for management of her renal failure. They will go directly there within the hour. I called the charge nurse at [facility] and notified her of the patient and the plan.
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
## 2015-12-28 NOTE — Telephone Encounter (Signed)
Patient is returning a missed call from [place]. She states that he requested to call back soon as possible.  [phone]

## 2015-12-28 NOTE — ED Provider Notes (Signed)
CSN: [id number]     Arrival date & time 12/28/15  [id number] History   First MD Initiated Contact with Patient 12/28/15 [phone]     Chief Complaint  Patient presents with  . Abnormal Lab  . Altered Mental Status     (Consider location/radiation/quality/duration/timing/severity/associated sxs/prior Treatment) Patient is a 55 y.o. female presenting with altered mental status. The history is provided by the patient and medical records.  Altered Mental Status  55 year old female with history of hypertension, anxiety, depression, sleep apnea, chronic nerve pain, fibromyalgia, anemia, chronic back pain, presenting to the ED with abnormal labs.   Patient here with friend at bedside who contributing to history.  Per friend, patient was at sitting for her last week and over the weekend. She reports when she returned home on Saturday patient did appear somewhat confused and was falling asleep during normal conversation. Patient reports she also fell asleep while driving home at a stoplight but awoke and was able to finish driving home. She has not used her CPAP all weekend and has not had any of her home medications in the past 3 days. Patient continued to appear somewhat "off" yesterday Zofran took her to urgent care. She had labs sent off. She has had a head CT performed which was negative for acute findings. She was called this morning due to elevated creatinine at 5.58 on her labs and told to come to the ED. Friend reports she has been staying with her since yesterday afternoon and has been pushing oral fluids. Patient has continued urinating normally.  Patient has no baseline renal issues.  She has seen a nephrologist in the past in [place], [place].  Past Medical History  Diagnosis Date  . Hypertension   . Anxiety   . Mental disorder   . Depression   . Sleep apnea   . Headache(784.0)   . Neuromuscular disorder ([place])   . 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 Fibromyalgia   . Arthritis     degenerative   . Anemia 05/01/2012  . Gastro-esophageal reflux   . Sleep apnea     CPAP-9  . Pancreatitis   . Diarrhea     EPISODIC  . Back pain     lower   Past Surgical History  Procedure Laterality Date  . Cholecystectomy    . Rotator cuff repair  bilateral  . Nasal septoplasty w/ turbinoplasty    . Esophagogastroduodenoscopy  12/21/2011    Procedure: ESOPHAGOGASTRODUODENOSCOPY (EGD);  Surgeon: Missy Sabins, MD;  Location: [facility] ENDOSCOPY;  Service: Endoscopy;  Laterality: N/A;  . Gallbladder surgery    . Flexible sigmoidoscopy  05/01/2012    Procedure: FLEXIBLE SIGMOIDOSCOPY;  Surgeon: Jeryl Columbia, MD;  Location: [facility] ENDOSCOPY;  Service: Endoscopy;  Laterality: N/A;  unprepped  . Biceps tendon repair      13  . Nasal sinus surgery    . Esophagogastroduodenoscopy N/A 11/06/2012    Procedure: ESOPHAGOGASTRODUODENOSCOPY (EGD);  Surgeon: Missy Sabins, MD;  Location: [facility] ENDOSCOPY;  Service: Endoscopy;  Laterality: N/A;  . Botox injection N/A 11/06/2012    Procedure: BOTOX INJECTION;  Surgeon: Missy Sabins, MD;  Location: [facility] ENDOSCOPY;  Service: Endoscopy;  Laterality: N/A;  . Esophagogastroduodenoscopy N/A 12/04/2013    Procedure: ESOPHAGOGASTRODUODENOSCOPY (EGD);  Surgeon: Missy Sabins, MD;  Location: [facility] ENDOSCOPY;  Service: Endoscopy;  Laterality: N/A;  . Botox injection N/A 12/04/2013    Procedure: BOTOX INJECTION;  Surgeon: Missy Sabins, MD;  Location: [facility] ENDOSCOPY;  Service: Endoscopy;  Laterality: N/A;  . Esophagogastroduodenoscopy N/A 02/26/2014    Procedure: ESOPHAGOGASTRODUODENOSCOPY (EGD);  Surgeon: Missy Sabins, MD;  Location: [facility] ENDOSCOPY;  Service: Endoscopy;  Laterality: N/A;  . Botox injection N/A 02/26/2014    Procedure: BOTOX INJECTION;  Surgeon: Missy Sabins, MD;  Location: [place];  Service: Endoscopy;  Laterality: N/A;  . Esophagogastroduodenoscopy (egd) with propofol N/A 07/08/2015    Procedure: ESOPHAGOGASTRODUODENOSCOPY (EGD) WITH PROPOFOL;  Surgeon: Teena Irani, MD;  Location:  [facility] ENDOSCOPY;  Service: Endoscopy;  Laterality: N/A;  . Botox injection N/A 07/08/2015    Procedure: BOTOX INJECTION;  Surgeon: Teena Irani, MD;  Location: [facility] ENDOSCOPY;  Service: Endoscopy;  Laterality: N/A;   Family History  Problem Relation Age of Onset  . Heart disease Mother   . COPD Mother   . Arthritis Mother   . Cancer Mother   . Hypertension Mother   . Arthritis Paternal Grandmother   . Heart disease Paternal Grandmother   . Heart disease Paternal Grandfather    Social History  Substance Use Topics  . Smoking status: Never Smoker   . Smokeless tobacco: Never Used  . Alcohol Use: No   OB History    No data available     Review of Systems  Constitutional:       Abnormal labs  All other systems reviewed and are negative.     Allergies  Dilaudid; Pregabalin; Metoclopramide hcl; Neurontin; Other; and Reglan  Home Medications   Prior to Admission medications   Medication Sig Start Date End Date Taking? Authorizing Provider  albuterol (PROVENTIL HFA;VENTOLIN HFA) 108 (90 BASE) MCG/ACT inhaler Inhale 2 puffs into the lungs as needed for wheezing or shortness of breath. 03/04/15   [name], MD  allopurinol (ZYLOPRIM) 300 MG tablet Take 0.5 tablets (150 mg total) by mouth daily. 11/24/15   [name], MD  ALPRAZolam Duanne Moron) 1 MG tablet Take 1 tablet (1 mg total) by mouth 3 (three) times daily as needed for anxiety. 07/31/15   [name], MD  amLODipine (NORVASC) 2.5 MG tablet Take 1 tablet (2.5 mg total) by mouth daily. 07/17/15   [name], MD  baclofen (LIORESAL) 10 MG tablet TAKE 1 TABLET (10 MG TOTAL) BY MOUTH 3 (THREE TIMES DAILY) 02/06/15   [name], MD  benazepril (LOTENSIN) 40 MG tablet TAKE 1 TABLET (40 MG TOTAL) BY MOUTH DAILY. 07/31/15   [name], MD  Biotin 5000 MCG CAPS Take 10,000 mcg by mouth daily.     Historical Provider, MD  cetirizine (ZYRTEC) 10 MG tablet Take 10 mg by mouth daily.    Historical Provider, MD   Cholecalciferol (VITAMIN D) 2000 UNITS tablet Take 2,000 Units by mouth daily.    Historical Provider, MD  ciprofloxacin (CIPRO) 500 MG tablet Take 1 tablet (500 mg total) by mouth 2 (two) times daily with a meal. 12/27/15   [name], PA-C  clotrimazole-betamethasone (LOTRISONE) cream Apply 1 application topically 2 (two) times daily. 11/24/15   [name], MD  dexlansoprazole (DEXILANT) 60 MG capsule Take 60 mg by mouth 2 (two) times daily.    Historical Provider, MD  fexofenadine (ALLEGRA) 180 MG tablet Take 180 mg by mouth daily. Reported on 07/17/2015    Historical Provider, MD  fluticasone (FLONASE) 50 MCG/ACT nasal spray Place 2 sprays into both nostrils daily. 11/05/15   [name], PA-C  furosemide (LASIX) 20 MG tablet TAKE 1 TABLET BY MOUTH DAILY AS NEEDED 11/19/14   Synetta Shadow  Lauenstein, MD  HYDROcodone-acetaminophen (NORCO) 10-325 MG per tablet Take 1-2 tablets by mouth every 6 (six) hours as needed (pain.).    Historical Provider, MD  ibuprofen (ADVIL,MOTRIN) 600 MG tablet Take 600 mg by mouth every 6 (six) hours as needed for mild pain.     Historical Provider, MD  metoprolol succinate (TOPROL-XL) 100 MG 24 hr tablet Take 1 tablet (100 mg total) by mouth daily. Take with or immediately following a meal. 07/31/15   [name], MD  modafinil (PROVIGIL) 200 MG tablet Take 0.5 tablets (100 mg total) by mouth 2 (two) times daily. 12/02/14   Carmen Dohmeier, MD  montelukast (SINGULAIR) 10 MG tablet TAKE 1 TABLET (10 MG TOTAL) BY MOUTH AT BEDTIME 11/24/15   [name], MD  morphine (MS CONTIN) 30 MG 12 hr tablet Take 30 mg by mouth every 8 (eight) hours. Not from [facility] 09/23/12   Historical Provider, MD  Multiple Vitamins-Minerals (MULTIVITAMIN PO) Take 1 tablet by mouth daily.    Historical Provider, MD  Olopatadine HCl (PATADAY) 0.2 % SOLN APPLY 1 DROP TO EYE 2 (TWO) TIMES DAILY. 07/05/15   [name], MD  Pancrelipase, Lip-Prot-Amyl, (CREON) 24000 UNITS CPEP Take 48,000 Units by mouth 4  (four) times daily as needed (for pancreatitis).    Historical Provider, MD  promethazine (PHENERGAN) 25 MG tablet Take 1 tablet (25 mg total) by mouth every 6 (six) hours as needed for nausea or vomiting. 12/27/14   [name], MD  spironolactone (ALDACTONE) 100 MG tablet Take 1 tablet (100 mg total) by mouth daily. 11/24/15   [name], MD  sucralfate (CARAFATE) 1 GM/10ML suspension Take 10 mLs (1 g total) by mouth 4 (four) times daily -  with meals and at bedtime. 05/01/15   [name], MD   BP 133/87 mmHg  Pulse 111  Temp(Src) 98.5 F (36.9 C)  Resp 15  SpO2 99%   Physical Exam  Constitutional: She is oriented to person, place, and time. She appears well-developed and well-nourished.  Obese, NAD  HENT:  Head: Normocephalic and atraumatic.  Mouth/Throat: Oropharynx is clear and moist.  Dry mucous membranes, aphthous ulcer noted to right lateral tongue  Eyes: Conjunctivae and EOM are normal. Pupils are equal, round, and reactive to light.  Neck: Normal range of motion.  Cardiovascular: Normal rate, regular rhythm and normal heart sounds.   Pulmonary/Chest: Effort normal and breath sounds normal. No respiratory distress. She has no wheezes.  Abdominal: Soft. Bowel sounds are normal.  Musculoskeletal: Normal range of motion.  Neurological: She is alert and oriented to person, place, and time.  AAOx3, answering questions and following commands appropriately; equal strength UE and LE bilaterally; CN grossly intact; moves all extremities appropriately without ataxia; shaking in the lower legs (states she is cold), no true tremors or seizure activity noted  Skin: Skin is warm and dry.  Psychiatric: She has a normal mood and affect. Her speech is normal.  Speech normal, goal oriented, fluid conversation  Nursing note and vitals reviewed.   ED Course  Procedures (including critical care time) Labs Review Labs Reviewed  CBC WITH DIFFERENTIAL/PLATELET - Abnormal; Notable  for the following:    Hemoglobin 11.9 (*)    All other components within normal limits  BASIC METABOLIC PANEL - Abnormal; Notable for the following:    CO2 17 (*)    Glucose, Bld 128 (*)    BUN 68 (*)    Creatinine, Ser 2.53 (*)    GFR calc non Af  Amer 21 (*)    GFR calc Af Amer 24 (*)    All other components within normal limits  URINALYSIS, ROUTINE W REFLEX MICROSCOPIC (NOT AT [facility]) - Abnormal; Notable for the following:    APPearance CLOUDY (*)    Hgb urine dipstick MODERATE (*)    Ketones, ur 40 (*)    Leukocytes, UA MODERATE (*)    All other components within normal limits  URINE MICROSCOPIC-ADD ON - Abnormal; Notable for the following:    Squamous Epithelial / LPF 6-30 (*)    Bacteria, UA FEW (*)    All other components within normal limits    Imaging Review Ct Head Wo Contrast  12/27/2015  CLINICAL DATA:  Confusion/altered mental status for 2 days. Evaluate for TIA or other intracranial process. EXAM: CT HEAD WITHOUT CONTRAST TECHNIQUE: Contiguous axial images were obtained from the base of the skull through the vertex without intravenous contrast. COMPARISON:  None. FINDINGS: Brain: There is no evidence of acute intracranial hemorrhage, mass lesion, brain edema or extra-axial fluid collection. The ventricles and subarachnoid spaces are appropriately sized for age. There is no CT evidence of acute cortical infarction. Bones/sinuses/visualized face: The visualized paranasal sinuses, mastoid air cells and middle ears are clear. The calvarium is intact. Persistent metopic suture noted. IMPRESSION: Negative noncontrast head CT. These results will be called to the ordering clinician or representative by the Radiology Department at the imaging location. Electronically Signed   By: Richardean Sale M.D.   On: 12/27/2015 17:25   I have personally reviewed and evaluated these images and lab results as part of my medical decision-making.   EKG Interpretation None      MDM   Final  diagnoses:  Acute renal failure, unspecified acute renal failure type [facility])   55 year old female here with abnormal labs. She had labs performed at urgent care yesterday which were consistent with acute renal failure. On arrival today patient is tachycardic but is awake, alert, and fully oriented to her baseline. She had CT of her head yesterday which is negative for any acute findings. Her labs today have somewhat improved from yesterday but still with significantly elevated serum creatinine and BUN from her baseline. Patient will require admission for continued hydration. Patient also with some noted shaking in her lower extremities. She is on home MS Contin but has not had this for the past few days. She is concerned for questionable withdrawal, however she has no other signs/symptoms concerning for acute withdrawal.  Given her recent changes in mental status, will hold this for now.  Patient admitted to family medicine service, temp admit orders placed.  Tele, obs, Dr. Nori Riis. Tachycardia improving with fluids at this time.  Larene Pickett, PA-C 12/28/15 [place], MD 12/30/15 [phone]

## 2015-12-29 LAB — COMPREHENSIVE METABOLIC PANEL
ALBUMIN: 3.5 g/dL (ref 3.5–5.0)
ALK PHOS: 32 U/L — AB (ref 38–126)
ALT: 25 U/L (ref 14–54)
AST: 42 U/L — AB (ref 15–41)
Anion gap: 10 (ref 5–15)
BILIRUBIN TOTAL: 0.8 mg/dL (ref 0.3–1.2)
BUN: 35 mg/dL — AB (ref 6–20)
CO2: 21 mmol/L — ABNORMAL LOW (ref 22–32)
Calcium: 9.2 mg/dL (ref 8.9–10.3)
Chloride: 111 mmol/L (ref 101–111)
Creatinine, Ser: 1.48 mg/dL — ABNORMAL HIGH (ref 0.44–1.00)
GFR calc Af Amer: 46 mL/min — ABNORMAL LOW (ref >60)
GFR calc non Af Amer: 39 mL/min — ABNORMAL LOW (ref >60)
GLUCOSE: 112 mg/dL — AB (ref 65–99)
POTASSIUM: 4.9 mmol/L (ref 3.5–5.1)
Sodium: 142 mmol/L (ref 135–145)
TOTAL PROTEIN: 6.4 g/dL — AB (ref 6.5–8.1)

## 2015-12-29 LAB — CBC
HEMATOCRIT: 32.9 % — AB (ref 36.0–46.0)
Hemoglobin: 10.7 g/dL — ABNORMAL LOW (ref 12.0–15.0)
MCH: 29.9 pg (ref 26.0–34.0)
MCHC: 32.5 g/dL (ref 30.0–36.0)
MCV: 91.9 fL (ref 78.0–100.0)
Platelets: 231 10*3/uL (ref 150–400)
RBC: 3.58 MIL/uL — ABNORMAL LOW (ref 3.87–5.11)
RDW: 13.8 % (ref 11.5–15.5)
WBC: 7.6 10*3/uL (ref 4.0–10.5)

## 2015-12-29 LAB — CK: Total CK: 977 U/L — ABNORMAL HIGH (ref 38–234)

## 2015-12-29 LAB — URINE CULTURE

## 2015-12-29 LAB — UREA NITROGEN, URINE: Urea Nitrogen, Ur: 888 mg/dL

## 2015-12-29 MED ORDER — ACETAMINOPHEN 325 MG PO TABS
650.0000 mg | ORAL_TABLET | ORAL | Status: DC | PRN
  Administered 2015-12-29: 650 mg via ORAL
  Filled 2015-12-29: qty 2

## 2015-12-29 MED ORDER — RAMELTEON 8 MG PO TABS
8.0000 mg | ORAL_TABLET | Freq: Once | ORAL | Status: AC
  Administered 2015-12-29: 8 mg via ORAL
  Filled 2015-12-29: qty 1

## 2015-12-29 NOTE — Progress Notes (Signed)
Family Medicine Teaching Service Daily Progress Note Intern Pager: [phone]  Patient name: Valerie [name] Medical record number: [id number] Date of birth: 1960/11/27 Age: 55 y.o. Gender: female  Primary Care Provider: [name], MD Consultants: None Code Status: Full  Pt Overview and Major Events to Date:    Assessment and Plan: Valerie [name] is a 55 y.o. female presenting with 2-3 days of confusion / lethargy / amnesia and AKI. Marland Kitchen PMH is significant for hypertension, chronic pain on opioids, sleep apnea, anxiety, depression, fibromyalgia, pancreatitis  Confusion and retrograde amnesia: Etiology unclear, but patient presented to [facility] urgent care with creatinine to 5.4 and BUN 110, possible that patient was uremic upon presentation. Today in the ED her creatinine has improved to 2.53 and BUN was 68. Also unclear the source of kidney injury. Polypharmacy also suspected to play a roll in patient confusion. She is on multiple narcotics, including 30mg  MS contin TID, provigil 200mg  BID, norco 10-325mg  q6hr and xanax 1mg  TID. Patient had a normal head CT. Other possible causes could be due to thyroid dysfunction, syphilis or B12 deficiency. Patient also complained of non-specific ulcer on tip of her tongue. We cannot exclude HSV encephalitis, although patient is afebrile, has normal white count, and has no concerning physical exam findings. Would expect patient to be much more ill if this were the case. It is also possible that the patient became hypercapnic from her new CPAP mask not working properly, which could explain some symptoms, though would not necessarily explain her amnesia. Per patient's friend her mental status is improving. Patient notes not taking medication for past three days. Patient reported tremors in her hands in the ED. No asterixis. B12 946, CK 1761>977, TSH 0.291.  UDS + benzo and opiates. MRI normal.  Patient noted this morning that she had black  stools yesterday and this AM and has had ischemic colitis in the past.  Stool guaiac positive.  Patient was suprisingly calm with the news that she had blood in her stool and offered to forego breakfast for colonoscopy today.  Likely a major psych component going on.  Patient improving and can be worked up for guaiac + stools outpatient.  Likely discharge today.   - f/u [facility] - CIWA  AKI: Patient's baseline Cr is ~1.1-1.3 and she presented on 7/17 to urgent care with Cr to 5.6 and BUN 110. Today in the ED Cr has trended down to 2.53>1.48 and BUN 68>35 Patient's home med list includes ibuprofen 600mg  Q6hrs, but she denies using any in months. Patient on benazepril and spironolactone daily. Has lasix on med list, but does not use frequently. Patient reports that she does not remember most of this past Saturday and reported a fall injury. Possible that the patient was down for a period of time and had rhabdomyolysis. CK at 1761 during presentation, which is two days from her reported fall.Initial values likely to be much larger.  Fractional excretion of urea was >35% indicating instrinsic renal failure.  Likely cause of this woman's acute renal failure was ATN 2/2 rhabdomyolysis.   - CK trending downward, consider further trending - continue IV hydration overnight - Hold home Ace inhibitor and diuretics   Knee pain:  Patient had a fall and was complaining of knee pain.  XR: normal.  Elevated CK to 1761 trending down to 977.   - tylenol PRN  Urinary tract infection: Patient diagnosed with UTI at urgent care yesterday. Started on cipro 500mg  BID. Denies dysuria, fever or  chills.  -cont cipro 500mg  BID  Hypertension: Stable. Pressures have been variable, but stable between 119/72-154/84. Patient notes not taking medication for past three days.  - continue metoprolol 100mg , holding lotensin 40mg  and lasix 20mg .   Chronic pain: Patient has had surgery on both shoulders and complained of on  and off cramping in her arms and hands. She takes 30mg  MS contin TID and norco 10-325mg  q6hr daily for pain management. There was mention of her recently increasing MS contin to 60mg  TID. Will need to discuss with patient.  - currently holding all pain medication - will reassess as appropriate - Monitor with CIWA for signs of withdrawal.   Anxiety: Patient reports taking xanax a couple of times per week. She is prescribed 1mg  PRN. - holding xanax - Monitor with CIWA  Sleep apnea: patient reports using CPAP at home, but the suction has been poor for the past few days. She reports not using it during this time. Did not use her machine last night due to incompatibility with her mask.   - patient has notified nurse  FEN/GI: NS @100cc /hr,  Prophylaxis: protonix, SQ heparin  Disposition: observation under Dr. Nori Riis.    Subjective:  Patient feels less cloudy this AM.  Denies any SOB, fevers, chills, dysuria.    Objective: Temp:  [98.3 F (36.8 C)-99.3 F (37.4 C)] 98.6 F (37 C) (07/19 0807) Pulse Rate:  [72-108] 73 (07/19 0807) Resp:  [10-19] 17 (07/19 0653) BP: (122-157)/(69-92) 157/81 mmHg (07/19 0807) SpO2:  [96 %-100 %] 98 % (07/19 0807) Weight:  [263 lb 4.8 oz (119.432 kg)] 263 lb 4.8 oz (119.432 kg) (07/18 2117) Physical Exam: General: pleasant obese middle aged woman lying down in NAD Eyes: EOMI, PERRLA, non-icteric ENTM: Small 1cm ulcerated lesion on right lateral aspect of distal tongue Neck: Tender to palpation over c-spine, no nuchal rigidity Cardiovascular: RRR, s1/s2 present, no mrg Respiratory: CTABL, normal work of breathing Abdomen: obese, soft, non-tender, non-distended, positive bowel sounds MSK: Trace non pitting edema noted in bilateral LE Skin: warm and well perfused Neuro: AAOx3. Somewhat lethargic, though responds to questions appropriately. CN2-12 intact. Strength 5/5 in upper and lower extremities. Sensation to light touch intact throughout. No  asterixis. Negative Kernig and Brudzinski signs. Psych: Normal mood and affect  Laboratory:  Recent Labs Lab 12/28/15 1041 12/28/15 1812 12/29/15 0616  WBC 8.8 7.4 7.6  HGB 11.9* 11.1* 10.7*  HCT 36.7 34.6* 32.9*  PLT 253 260 231    Recent Labs Lab 12/27/15 1446 12/28/15 1041 12/28/15 1812 12/29/15 0616  NA 139 139  --  142  K 5.4* 5.0  --  4.9  CL 106 109  --  111  CO2 16* 17*  --  21*  BUN 110* 68*  --  35*  CREATININE 5.58* 2.53* 1.82* 1.48*  CALCIUM 9.2 9.5  --  9.2  PROT 7.3  --   --  6.4*  BILITOT 0.3  --   --  0.8  ALKPHOS 36  --   --  32*  ALT 20  --   --  25  AST 46*  --   --  42*  GLUCOSE 116* 128*  --  112*    Imaging/Diagnostic Tests: Ct Head Wo Contrast  12/27/2015  CLINICAL DATA:  Confusion/altered mental status for 2 days. Evaluate for TIA or other intracranial process. EXAM: CT HEAD WITHOUT CONTRAST TECHNIQUE: Contiguous axial images were obtained from the base of the skull through the vertex without intravenous contrast. COMPARISON:  None. FINDINGS: Brain: There is no evidence of acute intracranial hemorrhage, mass lesion, brain edema or extra-axial fluid collection. The ventricles and subarachnoid spaces are appropriately sized for age. There is no CT evidence of acute cortical infarction. Bones/sinuses/visualized face: The visualized paranasal sinuses, mastoid air cells and middle ears are clear. The calvarium is intact. Persistent metopic suture noted. IMPRESSION: Negative noncontrast head CT. These results will be called to the ordering clinician or representative by the Radiology Department at the imaging location. Electronically Signed   By: Richardean Sale M.D.   On: 12/27/2015 17:25   Mr Brain Wo Contrast  12/29/2015  CLINICAL DATA:  Initial evaluation for acute confusion, lethargy. EXAM: MRI HEAD WITHOUT CONTRAST TECHNIQUE: Multiplanar, multiecho pulse sequences of the brain and surrounding structures were obtained without intravenous contrast.  COMPARISON:  Prior CT from 12/27/2015. FINDINGS: Cerebral volume within normal limits. No focal parenchymal signal abnormality identified. No abnormal foci of restricted diffusion to suggest acute infarct. Gray-white matter differentiation well maintained. Major intracranial vascular flow voids are well preserved. No acute or chronic intracranial hemorrhage. No areas of chronic infarction. No mass lesion, midline shift, or mass effect. No hydrocephalus. No extra-axial fluid collection. Major dural sinuses are grossly patent. Craniocervical junction normal. Visualized upper cervical spine unremarkable. Pituitary gland normal. No acute abnormality about the globes and orbits. Scattered mucosal thickening throughout the paranasal sinuses. No air-fluid level to suggest active sinus infection. No mastoid effusion. Inner ear structures normal. Bone marrow signal intensity within normal limits. No scalp soft tissue abnormality. IMPRESSION: Normal brain MRI. Electronically Signed   By: Jeannine Boga M.D.   On: 12/29/2015 01:31   Dg Knee Complete 4 Views Left  12/28/2015  CLINICAL DATA:  Fall, twisting injury, left knee pain EXAM: LEFT KNEE - COMPLETE 4+ VIEW COMPARISON:  None available FINDINGS: Very minor degenerative arthritic changes of the lateral and patellofemoral compartments with bony spurring. Relatively preserved joint spaces. Normal alignment without acute fracture or effusion. No definite soft tissue abnormality. IMPRESSION: Minor lateral and patellofemoral compartment early degenerative changes. No acute osseous finding. Electronically Signed   By: Jerilynn Mages.  Shick M.D.   On: 12/28/2015 20:51     Eloise Levels, MD 12/29/2015, 12:11 PM PGY-1, [place] Intern pager: [phone], text pages welcome

## 2015-12-29 NOTE — Discharge Instructions (Signed)
You were admitted because of your increased kidney function and recent confusion. Your kidney function is back to near normal. You had a normal brain MRI and blood work. You should talk to your doctor about cutting back on your pain medications - this can often lead to increased confusion.

## 2015-12-30 ENCOUNTER — Telehealth: Payer: Self-pay

## 2015-12-30 ENCOUNTER — Ambulatory Visit (INDEPENDENT_AMBULATORY_CARE_PROVIDER_SITE_OTHER): Payer: Medicare Other | Admitting: Neurology

## 2015-12-30 ENCOUNTER — Encounter: Payer: Self-pay | Admitting: Neurology

## 2015-12-30 VITALS — BP 138/88 | HR 86 | Resp 20 | Ht 64.0 in | Wt 277.0 lb

## 2015-12-30 DIAGNOSIS — G4733 Obstructive sleep apnea (adult) (pediatric): Secondary | ICD-10-CM

## 2015-12-30 DIAGNOSIS — F1190 Opioid use, unspecified, uncomplicated: Secondary | ICD-10-CM | POA: Diagnosis not present

## 2015-12-30 DIAGNOSIS — E860 Dehydration: Secondary | ICD-10-CM

## 2015-12-30 DIAGNOSIS — N289 Disorder of kidney and ureter, unspecified: Secondary | ICD-10-CM

## 2015-12-30 DIAGNOSIS — Z9989 Dependence on other enabling machines and devices: Secondary | ICD-10-CM

## 2015-12-30 DIAGNOSIS — F05 Delirium due to known physiological condition: Secondary | ICD-10-CM

## 2015-12-30 DIAGNOSIS — E079 Disorder of thyroid, unspecified: Secondary | ICD-10-CM

## 2015-12-30 MED ORDER — MODAFINIL 200 MG PO TABS: 100.0000 mg | ORAL_TABLET | Freq: Two times a day (BID) | ORAL | Status: DC

## 2015-12-30 NOTE — Telephone Encounter (Signed)
RX for provigil faxed to [facility]. Received a receipt of confirmation.

## 2015-12-30 NOTE — Patient Instructions (Signed)
Chronic Kidney Disease °Chronic kidney disease occurs when the kidneys are damaged over a long period. The kidneys are two organs that lie on either side of the spine between the middle of the back and the front of the abdomen. The kidneys: °· Remove wastes and extra water from the blood. °· Produce important hormones. These help keep bones strong, regulate blood pressure, and help create red blood cells. °· Balance the fluids and chemicals in the blood and tissues. °A small amount of kidney damage may not cause problems, but a large amount of damage may make it difficult or impossible for the kidneys to work the way they should. If steps are not taken to slow down the kidney damage or stop it from getting worse, the kidneys may stop working permanently. Most of the time, chronic kidney disease does not go away. However, it can often be controlled, and those with the disease can usually live normal lives. °CAUSES °The most common causes of chronic kidney disease are diabetes and high blood pressure (hypertension). Chronic kidney disease may also be caused by: °· Diseases that cause the kidneys' filters to become inflamed. °· Diseases that affect the immune system. °· Genetic diseases. °· Medicines that damage the kidneys, such as anti-inflammatory medicines. °· Poisoning or exposure to toxic substances. °· A reoccurring kidney or urinary infection. °· A problem with urine flow. This may be caused by: °¨ Cancer. °¨ Kidney stones. °¨ An enlarged prostate in males. °SIGNS AND SYMPTOMS °Because the kidney damage in chronic kidney disease occurs slowly, symptoms develop slowly and may not be obvious until the kidney damage becomes severe. A person may have a kidney disease for years without showing any symptoms. Symptoms can include: °· Swelling (edema) of the legs, ankles, or feet. °· Tiredness (lethargy). °· Nausea or vomiting. °· Confusion. °· Problems with urination, such as: °¨ Decreased urine  production. °¨ Frequent urination, especially at night. °¨ Frequent accidents in children who are potty trained. °· Muscle twitches and cramps. °· Shortness of breath. °· Weakness. °· Persistent itchiness. °· Loss of appetite. °· Metallic taste in the mouth. °· Trouble sleeping. °· Slowed development in children. °· Short stature in children. °DIAGNOSIS °Chronic kidney disease may be detected and diagnosed by tests, including blood, urine, imaging, or kidney biopsy tests. °TREATMENT °Most chronic kidney diseases cannot be cured. Treatment usually involves relieving symptoms and preventing or slowing the progression of the disease. Treatment may include: °· A special diet. You may need to avoid alcohol and foods that are salty and high in potassium. °· Medicines. These may: °¨ Lower blood pressure. °¨ Relieve anemia. °¨ Relieve swelling. °¨ Protect the bones. °HOME CARE INSTRUCTIONS °· Follow your prescribed diet.  Your health care provider may instruct you to limit daily salt (sodium) and protein intake. °· Take medicines only as directed by your health care provider. Do not take any new medicines (prescription, over-the-counter, or nutritional supplements) unless approved by your health care provider. Many medicines can worsen your kidney damage or need to have the dose adjusted.   °· Quit smoking if you smoke. Talk to your health care provider about a smoking cessation program. °· Keep all follow-up visits as directed by your health care provider. °· Monitor your blood pressure. °· Start or continue an exercise plan. °· Get immunizations as directed by your health care provider. °· Take vitamin and mineral supplements as directed by your health care provider. °SEEK IMMEDIATE MEDICAL CARE IF: °· Your symptoms get worse or you develop   new symptoms. °· You develop symptoms of end-stage kidney disease. These include: °¨ Headaches. °¨ Abnormally dark or light skin. °¨ Numbness in the hands or feet. °¨ Easy  bruising. °¨ Frequent hiccups. °¨ Menstruation stops. °· You have a fever. °· You have decreased urine production. °· You have pain or bleeding when urinating. °MAKE SURE YOU: °· Understand these instructions. °· Will watch your condition. °· Will get help right away if you are not doing well or get worse. °FOR MORE INFORMATION  °· American Association of Kidney Patients: www.aakp.org °· National Kidney Foundation: www.kidney.org °· American Kidney Fund: www.akfinc.org °· [employer] [facility]: www.lifeoptions.org and www.kidneyschool.org °  °This information is not intended to replace advice given to you by your health care provider. Make sure you discuss any questions you have with your health care provider. °  °Document Released: 03/07/2008 Document Revised: 06/19/2014 Document Reviewed: 01/26/2012 °Elsevier Interactive Patient Education ©2016 Elsevier Inc. ° °

## 2015-12-30 NOTE — Progress Notes (Signed)
[facility]  Provider:  Dr Lycia Sachdeva Referring Provider: [name], MD Primary Care Physician:  [name], MD  No chief complaint on file. This patient's primary neurologist is Dr. Floyde Parkins.   Dr. Jannifer Franklin ; ;2 years ago the patient has morbid obesity, fibromyalgia, interstitial cystitis, hypertension, elevated uric acid levels, gastroesophageal reflux gastroparesis, sleep apnea, pancreatitis,  the she has seen Dr. Jannifer Franklin for a left leg pain and lower back pain as well as right upper extremity discomfort in the past she also has degenerative disc disease, degenerative arthritis, status post sinus surgery and has asthma. Patient has  being admitted to hospital for ischemic colitis in Nov 2013 , none since. The patient reports a history of snoring and daytime excessive sleepiness and was with the same complaints evaluated 4 years ago for  sleep apnea [facility]).The overall AHI was 46.4 , the patient had 8 obstructive apneas nor mixed apneas , 1 central apnea and 122 hypopneas.  Oxygen desaturations to a nadir of 64% . The patient was titrated to CPAP , received a recommendation to use CPAP at 9 cm water pressure.   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 Joseph Art for  OSA , diagnosed after a  sleep study on  12-22-2008 at [facility]. She underwent a SPLIT study 02-18-13  here at [facility] . The patient underwent a retitration here at [facility] lab  on 9-9 2014,  with Dr. Joseph Art listed as the referring physician.  She could not tolerate the CPAP settings that she had moved here with ( from [place]  , [place]  )[phone].  At the time at [facility] she was titrated to 9 cm water- now for night use later re\re titration resulted in a pressure of 20 and an AHI of 20.6 RDI of 33.8 REM AHI of  21.2. There was no tachybradycardia arrhythmia and altered no prolonged desaturation altered, and no periodic limb movements seen. The patient was now actually titrated to 12 cm water she is now using a ResMed Mirage mask in a small size and uses heated humidity. The machine is the same she had 2010 .  Mrs. Morr will need a prescription for the full facemask that she has used before and that she prefers using in times of nasal allergic rhinitis. She also will have cushions refilled for her nasal pillow mask. She reports that over the last week for 2 weeks she has become excessively daytime sleepy again currently she endorsed the Epworth sleepiness score on at 13 points and the fatigue severity score at 53-54 points. This is dated 08-19-14     Her sleep habits and social habits have not changed in any way to explain the increase in daytime sleepiness and fatigue. She takes Zyrtec in the morning during her allergy season which should not give her this degree of sleepiness. She had no infection or inflammatory disorders that she is aware of. He is easier short of breath and she does have some underlying wheezing which have been mentioned by Dr. Verline Lema. She never had wheezing before 2015 there of. This may be an asthmatic component and she has Proventil as an inhaler available .her voice in the choir is affected. Her endurance span is shorter.  Social history :  She drinks caffeinated teas , 2-3  glasses at lunch and dinner. Juice for breakfast . She still naps in daytime, often right after taken modafinil.  Goes to bed around 11 PM, takes 30 minutes to 1 hour to  fall asleep.  Reads in bed, no TV.  CPAP has actually helped this a bit. Has one bathroom break at night , sleeps usually to 10 AM- 6-7 hours total sleep time. Sleep hygiene needs still improvement.  Dreams seldomly, not frequently, not vivid.   CPAP Download. : 08-19-14; The patient is using a C-Flex setting she is 100% compliance from 07-20-14  through 08-18-14. 100% over 4 hours of use was at average use of 7 hours 56 minutes. 93% of compliance. Average  AHI is 3.8,  which is sufficient. CPAP pressure is set at 12 cm water.   Interval history from 12/30/2015. Mrs. Kisamore was just released yesterday from a hospital stay necessitated by repeated loss of consciousness or loss of awareness, interpreted as sleep attacks!!!. She was admitted with acute renal failure and was confused. She states she spent the evening at a friend's house where she seemed to lose awareness of her surroundings, she reports felt falling asleep in the dry for. And neighbor knocked on the window of her car and woke her up. There have been several of these spells on Saturday last. By Monday she was admitted to hospital. This is not related to a sleep disorder with a metabolic problem with acute renal failure.1. Confusion 2. Drowsiness 3. Dehydration 4. Abnormal urinalysis - Will cover patient for possible pyelonephritis with IM ceftriaxone given UA. We will also obtain head CT to r/o intracranial process. Consider using ciprofloxacin to supplement ceftriaxone. Urine culture pending. Differential includes meningitis, adverse effects from multiple strong pain medications/CS.  5. Chronic pain syndrome - Counseled on use of controlled substances, patient insists that this is not a contributing factor.  [name], PA-C [facility]  The patient is 90% compliant with CPAP use she uses her CPAP 6 hours and 10 minutes each night and again she was not able to use her CPAP 3 of the 30 days because of hospitalization. CPAP is set at 12 cm water pressure he still has a very high AHI at 8.9 and I will change her CPAP machine to an outer titration setting. However her sleep attacks were likely confusional spells from uremia. She is excessively fatigued, and she endorsed the Epworth Sleepiness Scale at 11.5 points.   ROS , her Epworth score and her fatigue severity  have not changed over the last 18 months. Patients with renal failure are still allowed to take modafinil as it is a hepatic metabolized medication. The same is true for her sleep and pain medication And fatigue severity at 58 points. She states that she is using Provigil with some success . She is taking morphine 60 mg 3 times a day but she takes it only once a day and has been monitored by Dr. Verline Lema. She is actively followed by pain clinic.   DME : [facility] but has not received a call back- he needs to get her new tubing, filter and Mask.    She needs both liners refilled for FFM and nasal pillow.   [employer] CPAP  machine.            Current Outpatient Prescriptions  Medication Sig Dispense Refill  . albuterol (PROVENTIL HFA;VENTOLIN HFA) 108 (90 BASE) MCG/ACT inhaler Inhale 2 puffs into the lungs as  needed for wheezing or shortness of breath. 1 Inhaler 3  . allopurinol (ZYLOPRIM) 300 MG tablet Take 0.5 tablets (150 mg total) by mouth daily. 90 tablet 1  . ALPRAZolam (XANAX) 1 MG tablet Take 1 tablet (1 mg total) by mouth 3 (three) times daily as needed for anxiety. 270 tablet 1  . amLODipine (NORVASC) 2.5 MG tablet Take 1 tablet (2.5 mg total) by mouth daily. 30 tablet 3  . baclofen (LIORESAL) 10 MG tablet TAKE 1 TABLET (10 MG TOTAL) BY MOUTH 3 (THREE TIMES DAILY) 270 tablet 1  . benazepril (LOTENSIN) 40 MG tablet     . Biotin 5000 MCG CAPS Take 10,000 mcg by mouth daily.     . cetirizine (ZYRTEC) 10 MG tablet Take 10 mg by mouth daily.    . Cholecalciferol (VITAMIN D) 2000 UNITS tablet Take 2,000 Units by mouth daily.    . ciprofloxacin (CIPRO) 500 MG tablet Take 1 tablet (500 mg total) by mouth 2 (two) times daily with a meal. 20 tablet 0  . clotrimazole-betamethasone (LOTRISONE) cream Apply 1 application topically 2 (two) times daily. 30 g 0  . dexlansoprazole (DEXILANT) 60 MG capsule Take 60 mg by mouth 2 (two) times daily.    .  fexofenadine (ALLEGRA) 180 MG tablet Take 180 mg by mouth daily. Reported on 07/17/2015    . fluticasone (FLONASE) 50 MCG/ACT nasal spray Place 2 sprays into both nostrils daily. 16 g 0  . HYDROcodone-acetaminophen (NORCO) 10-325 MG per tablet Take 1-2 tablets by mouth every 6 (six) hours as needed (pain.).    Marland Kitchen metoprolol succinate (TOPROL-XL) 100 MG 24 hr tablet Take 1 tablet (100 mg total) by mouth daily. Take with or immediately following a meal. 90 tablet 3  . modafinil (PROVIGIL) 200 MG tablet Take 0.5 tablets (100 mg total) by mouth 2 (two) times daily. 90 tablet 1  . montelukast (SINGULAIR) 10 MG tablet TAKE 1 TABLET (10 MG TOTAL) BY MOUTH AT BEDTIME 90 tablet 3  . morphine (MS CONTIN) 30 MG 12 hr tablet Take 30 mg by mouth every 8 (eight) hours. Not from [facility]    . morphine (MS CONTIN) 60 MG 12 hr tablet     . Multiple Vitamins-Minerals (MULTIVITAMIN PO) Take 1 tablet by mouth daily.    . Olopatadine HCl (PATADAY) 0.2 % SOLN APPLY 1 DROP TO EYE 2 (TWO) TIMES DAILY. 2.5 mL 8  . Pancrelipase, Lip-Prot-Amyl, (CREON) 24000 UNITS CPEP Take 48,000 Units by mouth 4 (four) times daily as needed (for pancreatitis).    . predniSONE (DELTASONE) 10 MG tablet     . promethazine (PHENERGAN) 25 MG tablet Take 1 tablet (25 mg total) by mouth every 6 (six) hours as needed for nausea or vomiting. 30 tablet 1  . spironolactone (ALDACTONE) 100 MG tablet Take 1 tablet (100 mg total) by mouth daily. 90 tablet 3  . sucralfate (CARAFATE) 1 GM/10ML suspension Take 10 mLs (1 g total) by mouth 4 (four) times daily -  with meals and at bedtime. 420 mL 0   No current facility-administered medications for this visit.    Allergies as of 12/30/2015 - Review Complete 12/30/2015  Allergen Reaction Noted  . Dilaudid [hydromorphone hcl] Other (See Comments) 08/01/2011  . Pregabalin  08/01/2011  . Metoclopramide hcl Other (See Comments) 08/01/2011  . Neurontin [gabapentin] Other (See Comments) 08/01/2011  . Other Other  (See Comments) 08/01/2011  . Reglan [metoclopramide]  10/21/2012   Creatinine, Ser 0.44 - 1.00 mg/dL 1.82 (H) 2.53 (  H) 5.58 (H)R, CM 1.27 (H)R, CM     GFR calc non Af Amer >60 mL/min 31 (L) 60 mL/min" class="rz_1b" style="cursor: pointer;" onmouseover='jscript: var varStyle="underline"; var bgColor="#[id number]"; this.style.backgroundColor=bgColor; var children=this.getElementsByTagName("div"); for(var child=0;child 21 (L)      GFR calc Af Amer >60 mL/min 35 (L) 60 mL/min" class="rz_1b" style="cursor: pointer;" onmouseover='jscript: var varStyle="underline"; var bgColor="#[id number]"; this.style.backgroundColor=bgColor; var children=this.getElementsByTagName("div"); for(var child=0;child 24 (L)CM     Comments: (NOTE)  The eGFR has been calculated using the CKD EPI equation.  This calculation has not been validated in all clinical situations.  eGFR's persistently <60 mL/min signify possible Chronic Kidney  Disease.           Vitals: There were no vitals taken for this visit. Last Weight:  Wt Readings from Last 1 Encounters:  12/28/15 263 lb 4.8 oz (119.432 kg)   Last Height:   Ht Readings from Last 1 Encounters:  12/28/15 _0  (1.651 m)   Vision Screening:  Vitals   Physical exam:  General: The patient is awake, alert and appears not in acute distress. The patient is well groomed. Head: Normocephalic, atraumatic. Neck is supple. Large neck but low gradeupper airway restriction. Mallampati 1, narrow laterally , but high , peaked palate - short airway , may have trouble with intubation.   neck circumference:18 inches! . Cardiovascular:  Regular rate and rhythm, without  murmurs or carotid bruit, and without distended neck veins. Respiratory: Lungs are clear to auscultation. Skin:  Without evidence of edema, or rash Trunk: BMI is severely elevated , but  patient has normal posture. morbidly obese.   Neurologic exam : The patient is awake and alert, oriented to place and time.   Memory subjective  described as intact. There is a normal attention span & concentration ability.  Speech is fluent without dysarthria, dysphonia or aphasia. Mood and affect are appropriate.  Cranial nerves:  sense of smell is non-functional, she reports a metallic taste in her mouth. Tongue is " metallic ". Pupils are equal and briskly reactive to light. Funduscopic exam without evidence of pallor  (or edema). Extraocular movements  in vertical and horizontal planes intact and without nystagmus. Visual fields by finger perimetry are intact. Hearing to finger rub intact.   Facial sensation intact to fine touch. Facial motor strength is symmetric and tongue and uvula move midline.  Motor exam:   She reports pain in both legs with Valsalva!  Normal tone and normal muscle bulk and symmetric normal strength in all extremities. ROM limitation in  Right hip,  Left and right shoulder.  Sensory:  Fine touch, pinprick and vibration were tested in all extremities. Proprioception is tested in the upper extremities only. This was normal. Coordination: Rapid alternating movements in the fingers/hands is tested and normal.  Gait and station: Patient walks without assistive device and is able unassisted to climb up to the exam table. Deep tendon reflexes: in the  upper and lower extremities are symmetric and intact. Babinski maneuver response is downgoing.   Assessment:  After physical and neurologic examination, review of laboratory studies, imaging, neurophysiology testing and pre-existing records, assessment is that :  1a) Loss of awareness , repeated spells while on pain medications- treated in hospital as severely dehydrated with renal failure.  Not related to apnea or CPAP!! Confusion, metabolic related . EEG ordered. CT brain reviewed [facility] ED [facility] and MRI at [facility].    1b)OSA with CPAP at 12 cm water. Will change to auto papa , 8-15 cm water  2) peripheral neuropathy, brachial plexus injury - seen by  Dr. Jannifer Franklin. Not improved . 3) chronic pain , in pain therapy - non narcotics from [facility]   Plan:  Continue CPAP and obtain new supplies through your DME - CHOICE . Good tolerance of CPAP,  Visit 35 minutes with obtaining  ED notes, discussion of newly diagnosed. renal disease,  The spells appear to be confusional.  CPAP compliance and therapeutic data in office insufficient to explain the spells.     Yida Hyams, MD   Cc Dr Jannifer Franklin, Dr Joseph Art

## 2015-12-30 NOTE — Care Management Obs Status (Signed)
[facility] NOTIFICATION   Patient Details  Name: Valerie [name] MRN: [id number] Date of Birth: [birth year]   Medicare Observation Status Notification Given:  Yes  12/30/2015 Late entry, met with pt on 12/29/2015, OBS notification given and pt signed acknowledging that she understood  this notice.  Gen Clagg, Rory Percy, RN 12/30/2015, 3:12 PM

## 2015-12-30 NOTE — Addendum Note (Signed)
Addended by: Larey Seat on: 12/30/2015 10:43 AM   Modules accepted: Orders

## 2015-12-30 NOTE — Progress Notes (Signed)
The Fractional sodium excretion calculation suggests something other than dehydration is responsible for the renal insufficiency.  I don't understand the etiology of the elevated CK or blood in urine, either. There has been no recent change in medications. I agree that CPAP has nothing to do with the acute confusional state.  Nevertheless, an explanation for the azotemia, elevated CK, and, above all, the confusion has not been forthcoming.  I am concerned that this patient may experience further episodes of confusion, lethargy, and even collapse.  Indeed, she wrote me today stating that she was still not thinking well.   I am out of town for the next two weeks.  I hope you can suggest some further studies or follow up that might protect her from rehospitalization. Thanks, Synetta Shadow

## 2015-12-30 NOTE — Addendum Note (Signed)
Addended by: Larey Seat on: 12/30/2015 11:12 AM   Modules accepted: Orders

## 2015-12-30 NOTE — Telephone Encounter (Signed)
Patient called in to leave Valerie [name] a message to tell him thank you for saving her life, stated she came in to see him Monday and he sent her to ER on Tuesday and if it wouldn't have been for him she wouldn't be alive right now. She would like to have [facility] call her when he can so she can think him personally.  Her call back number is [phone]

## 2015-12-30 NOTE — Progress Notes (Signed)
I agree with the assessment and plan as directed by NP .The patient is known to me .   Dominique Calvey, MD  

## 2015-12-31 DIAGNOSIS — M542 Cervicalgia: Secondary | ICD-10-CM | POA: Diagnosis not present

## 2015-12-31 DIAGNOSIS — M7502 Adhesive capsulitis of left shoulder: Secondary | ICD-10-CM | POA: Diagnosis not present

## 2015-12-31 LAB — RPR: RPR Ser Ql: NONREACTIVE

## 2015-12-31 LAB — BASIC METABOLIC PANEL
BUN / CREAT RATIO: 18 (ref 9–23)
BUN: 26 mg/dL — AB (ref 6–24)
CHLORIDE: 104 mmol/L (ref 96–106)
CO2: 17 mmol/L — AB (ref 18–29)
Calcium: 9.5 mg/dL (ref 8.7–10.2)
Creatinine, Ser: 1.41 mg/dL — ABNORMAL HIGH (ref 0.57–1.00)
GFR calc Af Amer: 49 mL/min/{1.73_m2} — ABNORMAL LOW (ref >59)
GFR calc non Af Amer: 43 mL/min/{1.73_m2} — ABNORMAL LOW (ref >59)
GLUCOSE: 152 mg/dL — AB (ref 65–99)
Potassium: 4.7 mmol/L (ref 3.5–5.2)
SODIUM: 141 mmol/L (ref 134–144)

## 2015-12-31 NOTE — Telephone Encounter (Signed)
Called patient back and she is doing much better, she is very grateful and very sweet. She will follow up with Dr. Brett Fairy, has an EEG scheduled for Wednesday, 01/05/2016.

## 2015-12-31 NOTE — Discharge Summary (Signed)
[facility] Discharge Summary  Patient name: Valerie [name] Medical record number: [id number] Date of birth: [birth year] Age: 55 y.o. Gender: female Date of Admission: 12/28/2015  Date of Discharge: 12/29/15   Admitting Physician: Dickie La, MD  Primary Care Provider: [name], MD Consultants: None  Indication for Hospitalization: confusion, acute kidney injury  Discharge Diagnoses/Problem List:  Patient Active Problem List   Diagnosis Date Noted  . ARF (acute renal failure) ([place]) 12/28/2015  . Altered mental status 12/28/2015  . Acute renal failure ([place])   . Chronic pain syndrome   . Knee pain, acute   . OSA (obstructive sleep apnea)   . Morbid obesity ([place])   . Myalgia 08/19/2014  . Neoplasm of uncertain behavior of skin 02/03/2014  . Sleep apnea with use of continuous positive airway pressure (CPAP) 04/15/2013  . Obesity, morbid ([place]) 10/21/2012  . OSA on CPAP 10/21/2012  . Fibromyalgia 06/22/2012  . Back pain 06/22/2012  . Recurrent pancreatitis ([place]) 06/22/2012  . Depression 06/22/2012  . Anemia 05/01/2012  . Colitis 04/28/2012  . Leukocytosis 04/28/2012  . Colon polyp 08/01/2011  . PTSD (post-traumatic stress disorder) 08/01/2011  . Hypertension 08/01/2011  . IBS (irritable bowel syndrome) 08/01/2011     Disposition: Home  Discharge Condition: Stable, improved  Discharge Exam:  General: pleasant obese middle aged woman lying down in NAD Eyes: EOMI, PERRLA, non-icteric ENTM: Small 1cm ulcerated lesion on right lateral aspect of distal tongue Neck: Tender to palpation over c-spine, no nuchal rigidity Cardiovascular: RRR, s1/s2 present, no mrg Respiratory: CTABL, normal work of breathing Abdomen: obese, soft, non-tender, non-distended, positive bowel sounds MSK: Trace non pitting edema noted in bilateral LE Skin: warm and well perfused Neuro: AAOx3. Somewhat lethargic, though responds to questions appropriately. CN2-12  intact. Strength 5/5 in upper and lower extremities. Sensation to light touch intact throughout. No asterixis. Negative Kernig and Brudzinski signs. Psych: Normal mood and affect  Brief Hospital Course:  Valerie [name] is a 55 y.o. female presenting with 2-3 days of confusion / lethargy / amnesia and AKI.PMH is significant for hypertension, chronic pain on opioids, sleep apnea, anxiety, depression, fibromyalgia, pancreatitis  Confusion and retrograde amnesia with AKI: Patient presented to  urgent care and laboratory work up day prior to admission showed creatinine to 5.4 and BUN 110, (FENa =2).Marland Kitchen Upon presentation to the ED on date of admission,  her creatinine had improved to 2.53 and BUN was 68.  She is on multiple medications including 60mg  MS contin TID, provigil 200mg  BID, norco 10-325mg  q6hr and xanax 1mg  TID. Patient had a normal head CT, a normal MRI of brain. Her UDS was positive for benzos and opiates consistent with her home medications. On admission, her CK was elevated to 1761 initially and trended down to 977 with hydration.  Her pain medication and benzodiazepines were held while she was in the hospital. Her pain was managed with acetaminophen.  Regarding her AKI, it was unclear if patient was taking NSAIDs, Interpretation of her FENa is complicated by her diuretic use and by her elevated CK. FENa 77f 2.1 is  Considered borderline boundary between prerenal (FENa <1) and intrinsic kidney / ATN etiology (FENa> 2) in a patient who is on diuretics therapy with mild baseline CKD..  On discharge the working hypothesis we had for explanation of  her clinical picture culminating in admission was: a combination of urinary tract infection, with relatively high baseline doses of narcotics, benzodiazepines and  diuretics resulting in a  stuporous state.  High Risk Medication Use: She initially reported that she was only taking her MS contin once daily and that it was at a dose of 30 mg. A query  of the [place] narcotics database revealed regular fills of her MS contin, #90 per month---and the last two months that was at a dose of 60 mg, as well as #30 Hydrocodone 10/325, plus her benzodiazepine. Her dose of MS contin had undergone a recent change in the last two months--initially 30 TID to an increase of 60 mg TID. Combined with a potential sisnogle dose of hydrocodone daily (it is dosed PRN) this would place her at 190 morhine equivalents dosing MED), in addition to her benzodiazepine dosage. At this level of narcotic, even a slight change in kidney function---such as that from UTI, increased creatinine (decreased GFR) that can occur with ciprofloxacin, and/or combined insult from elevated CK (fall) could lead to quite elevated serum concentration of MS contin.  She reports being "out of it" and sitting on her porch for an extended period of time---perhaps overnight---which could further complicate her dehydration and CK elevations.   Knee pain: She reported a fall down some steps  two days prior to admission.  Knee exam was normal and knee X ray was negative for acute process  Elevated CK: On admission 1761. With overnight hydration it trended down to 977.  Urinary tract infection: Patient diagnosed with UTI at urgent care the day prior to admission. Started on cipro 500mg  BID. Denied any dysuria, fever or chills. She continued ciprofloxacin during her admission.    Issues for Follow Up:  1. Altered mental status: Most likely related to her 180 + MED of morphine daily in associate ion with acute kidney injury, multifactorial as explained above, Patient should have follow up with her primary care doctor as soon as possible. She should review her pain management with her pain clinic doctor. 2. UTI: Patient diagnosed with UTI one day prior to admission and was started on Ciprofloxacin 500mg  BID.  Will continue with course and follow up with her primary care doctor as appropriate. 3. Guaiac  positive stools:  Patient reported black stools the morning after admission and the day prior. H/H was stable, not symptomatic.  She had a guaiac positive stool.  Unclear of the significance of this finding. It could be related to some mild bowel ischemia resulting from her dehydration, AKI and stress of the events immediately prior to admission.  I would recommend that she follow up with her primary doctor.   Significant Procedures: None  Significant Labs and Imaging:   Recent Labs Lab 12/28/15 1041 12/28/15 1812 12/29/15 0616  WBC 8.8 7.4 7.6  HGB 11.9* 11.1* 10.7*  HCT 36.7 34.6* 32.9*  PLT 253 260 231    Recent Labs Lab 12/27/15 1446 12/28/15 1041 12/28/15 1812 12/29/15 0616 12/30/15 1051  NA 139 139  --  142 141  K 5.4* 5.0  --  4.9 4.7  CL 106 109  --  111 104  CO2 16* 17*  --  21* 17*  GLUCOSE 116* 128*  --  112* 152*  BUN 110* 68*  --  35* 26*  CREATININE 5.58* 2.53* 1.82* 1.48* 1.41*  CALCIUM 9.2 9.5  --  9.2 9.5  ALKPHOS 36  --   --  32*  --   AST 46*  --   --  42*  --   ALT 20  --   --  25  --  ALBUMIN 4.4  --   --  3.5  --    MRI brain:IMPRESSION: Normal brain MRI.  CT HEAD without contrast:IMPRESSION: Negative noncontrast head CT.  DG KNEE 4 view: LEFT: IMPRESSION: Minor lateral and patellofemoral compartment early degenerative changes. No acute osseous finding  Results/Tests Pending at Time of Discharge: None  Discharge Medications:    Medication List    STOP taking these medications        benazepril 40 MG tablet  Commonly known as:  LOTENSIN     furosemide 20 MG tablet  Commonly known as:  LASIX     ibuprofen 600 MG tablet  Commonly known as:  ADVIL,MOTRIN     modafinil 200 MG tablet  Commonly known as:  PROVIGIL      TAKE these medications        albuterol 108 (90 Base) MCG/ACT inhaler  Commonly known as:  PROVENTIL HFA;VENTOLIN HFA  Inhale 2 puffs into the lungs as needed for wheezing or shortness of breath.     allopurinol  300 MG tablet  Commonly known as:  ZYLOPRIM  Take 0.5 tablets (150 mg total) by mouth daily.     ALPRAZolam 1 MG tablet  Commonly known as:  XANAX  Take 1 tablet (1 mg total) by mouth 3 (three) times daily as needed for anxiety.     amLODipine 2.5 MG tablet  Commonly known as:  NORVASC  Take 1 tablet (2.5 mg total) by mouth daily.     baclofen 10 MG tablet  Commonly known as:  LIORESAL  TAKE 1 TABLET (10 MG TOTAL) BY MOUTH 3 (THREE TIMES DAILY)     Biotin 5000 MCG Caps  Take 10,000 mcg by mouth daily.     cetirizine 10 MG tablet  Commonly known as:  ZYRTEC  Take 10 mg by mouth daily.     ciprofloxacin 500 MG tablet  Commonly known as:  CIPRO  Take 1 tablet (500 mg total) by mouth 2 (two) times daily with a meal.     clotrimazole-betamethasone cream  Commonly known as:  LOTRISONE  Apply 1 application topically 2 (two) times daily.     CREON 24000 units Cpep  Generic drug:  Pancrelipase (Lip-Prot-Amyl)  Take 48,000 Units by mouth 4 (four) times daily as needed (for pancreatitis).     dexlansoprazole 60 MG capsule  Commonly known as:  DEXILANT  Take 60 mg by mouth 2 (two) times daily.     fexofenadine 180 MG tablet  Commonly known as:  ALLEGRA  Take 180 mg by mouth daily. Reported on 07/17/2015     fluticasone 50 MCG/ACT nasal spray  Commonly known as:  FLONASE  Place 2 sprays into both nostrils daily.     HYDROcodone-acetaminophen 10-325 MG tablet  Commonly known as:  NORCO  Take 1-2 tablets by mouth every 6 (six) hours as needed (pain.).     metoprolol succinate 100 MG 24 hr tablet  Commonly known as:  TOPROL-XL  Take 1 tablet (100 mg total) by mouth daily. Take with or immediately following a meal.     montelukast 10 MG tablet  Commonly known as:  SINGULAIR  TAKE 1 TABLET (10 MG TOTAL) BY MOUTH AT BEDTIME     morphine 30 MG 12 hr tablet  Commonly known as:  MS CONTIN  Take 30 mg by mouth every 8 (eight) hours. Not from [facility]     MULTIVITAMIN PO  Take 1  tablet by mouth daily.  Olopatadine HCl 0.2 % Soln  Commonly known as:  PATADAY  APPLY 1 DROP TO EYE 2 (TWO) TIMES DAILY.     promethazine 25 MG tablet  Commonly known as:  PHENERGAN  Take 1 tablet (25 mg total) by mouth every 6 (six) hours as needed for nausea or vomiting.     spironolactone 100 MG tablet  Commonly known as:  ALDACTONE  Take 1 tablet (100 mg total) by mouth daily.     sucralfate 1 GM/10ML suspension  Commonly known as:  CARAFATE  Take 10 mLs (1 g total) by mouth 4 (four) times daily -  with meals and at bedtime.     Vitamin D 2000 units tablet  Take 2,000 Units by mouth daily.        Discharge Instructions: Please refer to Patient Instructions section of EMR for full details.  Patient was counseled important signs and symptoms that should prompt return to medical care, changes in medications, dietary instructions, activity restrictions, and follow up appointments.   Follow-Up Appointments:     Follow-up Information    Schedule an appointment as soon as possible for a visit with [name], MD.   Specialty:  Family Medicine   Contact information:   [place] [id number]       Eloise Levels, MD 12/31/2015, 4:49 PM PGY-1, [facility]  Discharge Note : Attending Dorcas Mcmurray MD Pager [phone] Office [phone] I have seen and examined this patient, reviewed their chart and discussed discharge planning with the resident at the time of discharge. I agree with the discharge plan as above.  I personally discussed with her my theory regarding the sequence of events that led to her admission.I emphasized that she is on extremely high doses of narcotics combined with benzodiazepines which can be a deadly combination, especially in the setting of AKI. I will make sure both  her PCP who is here in [place] and her pain management physician Cyndi Bender, [place])  are copied on this  discharge summary.

## 2016-01-03 ENCOUNTER — Telehealth: Payer: Self-pay

## 2016-01-03 LAB — DRUG PROFILE, UR, 9 DRUGS (LABCORP)
Amphetamines, Urine: NEGATIVE ng/mL
BARBITURATE, UR: NEGATIVE ng/mL
BENZODIAZEPINE QUANT UR: NEGATIVE ng/mL
Cannabinoid Quant, Ur: NEGATIVE ng/mL
Cocaine (Metab.): NEGATIVE ng/mL
Methadone Screen, Urine: NEGATIVE ng/mL
OPIATE QUANT UR: POSITIVE — AB
PHENCYCLIDINE, UR: NEGATIVE ng/mL
PROPOXYPHENE, URINE: NEGATIVE ng/mL

## 2016-01-03 NOTE — Telephone Encounter (Signed)
-----   Message from Larey Seat, MD sent at 12/31/2015 11:06 AM EDT ----- Creatinine , a renal marker, is coming down nicely, potassium was in normal limits. Sodium, too.

## 2016-01-03 NOTE — Telephone Encounter (Signed)
I spoke to pt and advised her that her creatinine is coming down and that her potassium and sodium are within normal limits. Pt verbalized understanding of results. Pt had no questions at this time but was encouraged to call back if questions arise.

## 2016-01-04 ENCOUNTER — Telehealth: Payer: Self-pay | Admitting: Neurology

## 2016-01-04 DIAGNOSIS — Z9989 Dependence on other enabling machines and devices: Secondary | ICD-10-CM

## 2016-01-04 DIAGNOSIS — G4733 Obstructive sleep apnea (adult) (pediatric): Secondary | ICD-10-CM

## 2016-01-04 NOTE — Telephone Encounter (Signed)
I spoke to pt. She reports that she just got off the phone with [facility] and they are going to switch her current cpap out for one that supports the auto function on Thursday. Pt thanked me for my help.

## 2016-01-04 NOTE — Telephone Encounter (Signed)
Pt called said [facility] called requested she come and pick up equipment for 2 weeks. She said she will have to rent this 2 weeks and then bring it back and a report would be sent to Dr Brett Fairy. She said this is not what Dr Keturah Barre or Cyril Mourning told her. She was told the settings would be changed wireless. Please call

## 2016-01-04 NOTE — Telephone Encounter (Signed)
Pt returned call. Nurse took call.

## 2016-01-04 NOTE — Telephone Encounter (Signed)
I spoke to Amy at [facility]. She says that pt was just given a basic S10 machine on 11/05/2015 that does not support the auto function. It is only able to be set at one pressure setting at a time. Pt will not be eligible for a new cpap that supports that auto setting. Pt will be given (no charge) a 2 week auto unit but pt first must be set up for auto pay. I asked why she was only given a basic machine? We are under the assumption that our pt's are getting machines that can handle auto functions. Amy said that she will ask her manager if this pt can get a machine with an auto function.  I called pt to advise her on this update. No answer, left a message asking her to call me back.

## 2016-01-05 ENCOUNTER — Ambulatory Visit (INDEPENDENT_AMBULATORY_CARE_PROVIDER_SITE_OTHER): Payer: Medicare Other | Admitting: Neurology

## 2016-01-05 DIAGNOSIS — E860 Dehydration: Secondary | ICD-10-CM

## 2016-01-05 DIAGNOSIS — R410 Disorientation, unspecified: Secondary | ICD-10-CM

## 2016-01-05 DIAGNOSIS — Z9989 Dependence on other enabling machines and devices: Secondary | ICD-10-CM

## 2016-01-05 DIAGNOSIS — G4733 Obstructive sleep apnea (adult) (pediatric): Secondary | ICD-10-CM

## 2016-01-05 DIAGNOSIS — E079 Disorder of thyroid, unspecified: Secondary | ICD-10-CM

## 2016-01-05 DIAGNOSIS — F1190 Opioid use, unspecified, uncomplicated: Secondary | ICD-10-CM

## 2016-01-05 DIAGNOSIS — N289 Disorder of kidney and ureter, unspecified: Secondary | ICD-10-CM

## 2016-01-05 DIAGNOSIS — F05 Delirium due to known physiological condition: Secondary | ICD-10-CM

## 2016-01-05 NOTE — Progress Notes (Signed)
[facility]  Provider:  Dr Eliyohu Class Referring Provider: [name], MD Primary Care Physician:  [name], MD This patient's primary neurologist is Dr. Floyde Parkins.   Dr. Jannifer Franklin note from  ;2 years ago the patient has morbid obesity, fibromyalgia, interstitial cystitis, hypertension, elevated uric acid levels, gastroesophageal reflux gastroparesis, sleep apnea, pancreatitis,  the she has seen Dr. Jannifer Franklin for a left leg pain and lower back pain as well as right upper extremity discomfort in the past she also has degenerative disc disease, degenerative arthritis, status post sinus surgery and has asthma. Patient has  being admitted to hospital for ischemic colitis in Nov 2013 , none since. The patient reports a history of snoring and daytime excessive sleepiness and was with the same complaints evaluated 4 years ago for  sleep apnea [facility]).The overall AHI was 46.4 , the patient had 8 obstructive apneas nor mixed apneas , 1 central apnea and 122 hypopneas.  Oxygen desaturations to a nadir of 64% . The patient was titrated to CPAP , received a recommendation to use CPAP at 9 cm water pressure.   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 Joseph Art for  OSA , diagnosed after a  sleep study on  12-22-2008 at [facility]. She underwent a SPLIT study 02-18-13  here at [place] . The patient underwent a retitration here at [facility] lab  on 9-9 2014,  with Dr. Joseph Art listed as the referring physician.  She could not tolerate the CPAP settings that she had moved here with ( from [place]  , [place]  [phone].  At the time at [facility] she was titrated to 9 cm water- now for night use later re\re titration resulted in a pressure of 20 and an AHI of 20.6  RDI of 33.8 , REM AHI of 21.2. There was no  tachybradycardia arrhythmia and altered no prolonged desaturation altered, and no periodic limb movements seen. The patient was now actually titrated to 12 cm water she is now using a ResMed Mirage mask in a small size and uses heated humidity. The machine is the same she had 2010 .  Valerie [name] will need a prescription for the full facemask that she has used before and that she prefers using in times of nasal allergic rhinitis. She also will have cushions refilled for her nasal pillow mask. She reports that over the last week for 2 weeks she has become excessively daytime sleepy again currently she endorsed the Epworth sleepiness score on at 13 points and the fatigue severity score at 53-54 points. This is dated 08-19-14     Her sleep habits and social habits have not changed in any way to explain the increase in daytime sleepiness and fatigue. She takes Zyrtec in the morning during her allergy season which should not give her this degree of sleepiness. She had no infection or inflammatory disorders that she is aware of. He is easier short of breath and she does have some underlying wheezing which have been mentioned by Dr. Verline Lema. She never had wheezing before 2015 there of. This may be an asthmatic component and she has Proventil as an inhaler available .her voice in the choir is affected. Her endurance span is shorter.  Social history :  She drinks caffeinated teas , 2-3 glasses at lunch and  dinner. Juice for breakfast . She still naps in daytime, often right after taken modafinil.  Goes to bed around 11 PM, takes 30 minutes to 1 hour to  fall asleep.  Reads in bed, no TV.  CPAP has actually helped this a bit. Has one bathroom break at night , sleeps usually to 10 AM- 6-7 hours total sleep time. Sleep hygiene needs still improvement.  Dreams seldomly, not frequently, not vivid.   CPAP Download. : 08-19-14; The patient is using a C-Flex setting she is 100% compliance from 07-20-14 through 08-18-14.  100%  over 4 hours of use was at average use of 7 hours 56 minutes. 93% of compliance. Average  AHI is 3.8,  which is sufficient. CPAP pressure is set at 12 cm water.  Interval history from 12/30/2015. Valerie [name] was just released yesterday from a hospital stay necessitated by repeated loss of consciousness or loss of awareness, interpreted as sleep attacks!!!. She was admitted with acute renal failure and was confused. She states she spent the evening at a friend's house where she seemed to lose awareness of her surroundings, she reports felt falling asleep in the dry for. And neighbor knocked on the window of her car and woke her up. There have been several of these spells on Saturday last. By Monday she was admitted to hospital. This is not related to a sleep disorder with a metabolic problem with acute renal failure.1. Confusion 2. Drowsiness 3. Dehydration 4. Abnormal urinalysis - Will cover patient for possible pyelonephritis with IM ceftriaxone given UA. We will also obtain head CT to r/o intracranial process. Consider using ciprofloxacin to supplement ceftriaxone. Urine culture pending. Differential includes meningitis, adverse effects from multiple strong pain medications/CS.  5. Chronic pain syndrome - Counseled on use of controlled substances, patient insists that this is not a contributing factor.  [name], PA-C [facility]  The patient is 90% compliant with CPAP use she uses her CPAP 6 hours and 10 minutes each night and again she was not able to use her CPAP 3 of the 30 days because of hospitalization. CPAP is set at 12 cm water pressure he still has a very high AHI at 8.9 and I will change her CPAP machine to an outer titration setting. However her sleep attacks were likely confusional spells from uremia. She is excessively fatigued, and she endorsed the Epworth Sleepiness Scale at 11.5 points.   ROS , her Epworth score and her fatigue severity have not changed over  the last 18 months. Patients with renal failure are still allowed to take modafinil as it is a hepatic metabolized medication. The same is true for her sleep and pain medication And fatigue severity at 58 points. She states that she is using Provigil with some success . She is taking morphine 60 mg 3 times a day but she takes it only once a day and has been monitored by Dr. Verline Lema. She is actively followed by pain clinic.   DME : [facility] but has not received a call back- he needs to get her new tubing, filter and Mask.    She needs both liners refilled for FFM and nasal pillow.   [employer] CPAP  machine.            Current Outpatient Prescriptions  Medication Sig Dispense Refill  . albuterol (PROVENTIL HFA;VENTOLIN HFA) 108 (90 BASE) MCG/ACT inhaler Inhale 2 puffs into the lungs as needed for wheezing or  shortness of breath. 1 Inhaler 3  . allopurinol (ZYLOPRIM) 300 MG tablet Take 0.5 tablets (150 mg total) by mouth daily. 90 tablet 1  . ALPRAZolam (XANAX) 1 MG tablet Take 1 tablet (1 mg total) by mouth 3 (three) times daily as needed for anxiety. 270 tablet 1  . amLODipine (NORVASC) 2.5 MG tablet Take 1 tablet (2.5 mg total) by mouth daily. 30 tablet 3  . baclofen (LIORESAL) 10 MG tablet TAKE 1 TABLET (10 MG TOTAL) BY MOUTH 3 (THREE TIMES DAILY) 270 tablet 1  . benazepril (LOTENSIN) 40 MG tablet     . Biotin 5000 MCG CAPS Take 10,000 mcg by mouth daily.     . cetirizine (ZYRTEC) 10 MG tablet Take 10 mg by mouth daily.    . Cholecalciferol (VITAMIN D) 2000 UNITS tablet Take 2,000 Units by mouth daily.    . ciprofloxacin (CIPRO) 500 MG tablet Take 1 tablet (500 mg total) by mouth 2 (two) times daily with a meal. 20 tablet 0  . clotrimazole-betamethasone (LOTRISONE) cream Apply 1 application topically 2 (two) times daily. 30 g 0  . dexlansoprazole (DEXILANT) 60 MG capsule Take 60 mg by mouth 2 (two) times daily.    . fexofenadine (ALLEGRA) 180 MG  tablet Take 180 mg by mouth daily. Reported on 07/17/2015    . fluticasone (FLONASE) 50 MCG/ACT nasal spray Place 2 sprays into both nostrils daily. 16 g 0  . HYDROcodone-acetaminophen (NORCO) 10-325 MG per tablet Take 1-2 tablets by mouth every 6 (six) hours as needed (pain.).    Marland Kitchen metoprolol succinate (TOPROL-XL) 100 MG 24 hr tablet Take 1 tablet (100 mg total) by mouth daily. Take with or immediately following a meal. 90 tablet 3  . modafinil (PROVIGIL) 200 MG tablet Take 0.5 tablets (100 mg total) by mouth 2 (two) times daily. 90 tablet 1  . montelukast (SINGULAIR) 10 MG tablet TAKE 1 TABLET (10 MG TOTAL) BY MOUTH AT BEDTIME 90 tablet 3  . morphine (MS CONTIN) 30 MG 12 hr tablet Take 30 mg by mouth every 8 (eight) hours. Not from [place]    . morphine (MS CONTIN) 60 MG 12 hr tablet     . Multiple Vitamins-Minerals (MULTIVITAMIN PO) Take 1 tablet by mouth daily.    . Olopatadine HCl (PATADAY) 0.2 % SOLN APPLY 1 DROP TO EYE 2 (TWO) TIMES DAILY. 2.5 mL 8  . Pancrelipase, Lip-Prot-Amyl, (CREON) 24000 UNITS CPEP Take 48,000 Units by mouth 4 (four) times daily as needed (for pancreatitis).    . predniSONE (DELTASONE) 10 MG tablet     . promethazine (PHENERGAN) 25 MG tablet Take 1 tablet (25 mg total) by mouth every 6 (six) hours as needed for nausea or vomiting. 30 tablet 1  . spironolactone (ALDACTONE) 100 MG tablet Take 1 tablet (100 mg total) by mouth daily. 90 tablet 3  . sucralfate (CARAFATE) 1 GM/10ML suspension Take 10 mLs (1 g total) by mouth 4 (four) times daily -  with meals and at bedtime. 420 mL 0   No current facility-administered medications for this visit.     Allergies as of 01/05/2016 - Review Complete 12/30/2015  Allergen Reaction Noted  . Dilaudid [hydromorphone hcl] Other (See Comments) 08/01/2011  . Pregabalin  08/01/2011  . Metoclopramide hcl Other (See Comments) 08/01/2011  . Neurontin [gabapentin] Other (See Comments) 08/01/2011  . Other Other (See Comments) 08/01/2011  .  Reglan [metoclopramide]  10/21/2012   Creatinine, Ser 0.44 - 1.00 mg/dL 1.82 (H) 2.53 (H) 5.58 (H)R,  CM 1.27 (H)R, CM     GFR calc non Af Amer >60 mL/min 31 (L) 60 mL/min" class="rz_[id number]" style="cursor: pointer;" onmouseover='jscript: var varStyle="underline"; var bgColor="#[id number]"; this.style.backgroundColor=bgColor; var children=this.getElementsByTagName("div"); for(var child=0;child 21 (L)      GFR calc Af Amer >60 mL/min 35 (L) 60 mL/min" class="rz_1b" style="cursor: pointer;" onmouseover='jscript: var varStyle="underline"; var bgColor="#[id number]"; this.style.backgroundColor=bgColor; var children=this.getElementsByTagName("div"); for(var child=0;child 24 (L)CM     Comments: (NOTE)  The eGFR has been calculated using the CKD EPI equation.  This calculation has not been validated in all clinical situations.  eGFR's persistently <60 mL/min signify possible Chronic Kidney  Disease.           Vitals: There were no vitals taken for this visit. Last Weight:  Wt Readings from Last 1 Encounters:  12/30/15 277 lb (125.6 kg)   Last Height:   Ht Readings from Last 1 Encounters:  12/30/15 '5\' 4"'  (1.626 m)   Vision Screening:  Vitals   Physical exam:  General: The patient is awake, alert and appears not in acute distress. The patient is well groomed. Head: Normocephalic, atraumatic. Neck is supple. Large neck but low gradeupper airway restriction. Mallampati 1, narrow laterally , but high , peaked palate - short airway , may have trouble with intubation.   neck circumference:18 inches! . Cardiovascular:  Regular rate and rhythm, without  murmurs or carotid bruit, and without distended neck veins. Respiratory: Lungs are clear to auscultation. Skin:  Without evidence of edema, or rash Trunk: BMI is severely elevated , but  patient has normal posture. morbidly obese.   Neurologic exam : The patient is awake and alert, oriented to place and time.  Memory subjective  described as intact.  There is a normal attention span & concentration ability.  Speech is fluent without dysarthria, dysphonia or aphasia. Mood and affect are appropriate.  Cranial nerves:  sense of smell is non-functional, she reports a metallic taste in her mouth. Tongue is " metallic ". Pupils are equal and briskly reactive to light. Funduscopic exam without evidence of pallor  (or edema). Extraocular movements  in vertical and horizontal planes intact and without nystagmus. Visual fields by finger perimetry are intact. Hearing to finger rub intact.   Facial sensation intact to fine touch. Facial motor strength is symmetric and tongue and uvula move midline.  Motor exam:   She reports pain in both legs with Valsalva!  Normal tone and normal muscle bulk and symmetric normal strength in all extremities. ROM limitation in  Right hip,  Left and right shoulder.  Sensory:  Fine touch, pinprick and vibration were tested in all extremities. Proprioception is tested in the upper extremities only. This was normal. Coordination: Rapid alternating movements in the fingers/hands is tested and normal.  Gait and station: Patient walks without assistive device and is able unassisted to climb up to the exam table. Deep tendon reflexes: in the  upper and lower extremities are symmetric and intact. Babinski maneuver response is downgoing.   Assessment:  After physical and neurologic examination, review of laboratory studies, imaging, neurophysiology testing and pre-existing records, assessment is that :  1a) Loss of awareness , repeated spells while on pain medications- treated in hospital as severely dehydrated with renal failure.  Not related to apnea or CPAP!! Confusion, metabolic related . EEG ordered. CT brain reviewed for ED at [facility] and MRI at [facility].    1b)OSA with CPAP at 12 cm water. Will change to auto papa , 8-15 cm water   2) peripheral neuropathy, brachial  plexus injury - seen by Dr. Jannifer Franklin. Not improved . 3) chronic  pain , in pain therapy - no narcotics from [facility]   Plan:  Continue CPAP and obtain new supplies through your DME - CHOICE . Good tolerance of CPAP,  Visit 35 minutes with obtaining  ED notes, discussion of newly diagnosed. renal disease,  The spells appear to be confusional.   CPAP compliance and therapeutic data in office insufficient to explain the spells.     Valerie Belgard, MD   Cc Dr Jannifer Franklin, Dr Joseph Art

## 2016-01-05 NOTE — Procedures (Signed)
    History:  Valerie [name] is a 55 year old patient who had a recent hospitalization associated with acute renal failure. The patient had episodes of confusion and loss of awareness and loss of consciousness. The patient is being evaluated for these events.  This is a routine EEG. No skull defects are noted. Medications include albuterol, allopurinol, Xanax, Norvasc, baclofen, Lotensin, vitamin D supplementation, Dexilant, Flonase, Allegra, hydrocodone, metoprolol, Provigil, Singulair, MS Contin, multivitamins, prednisone, Phenergan, Aldactone, and Carafate.   EEG classification: Normal awake and asleep  Description of the recording: The background rhythms of this recording consists of a fairly well modulated medium amplitude background activity of 10 Hz. As the record progresses, the patient initially is in the waking state, but appears to enter the early stage II sleep during the recording, with rudimentary sleep spindles and vertex sharp wave activity seen. During the wakeful state, photic stimulation is performed, and this results in a bilateral and symmetric photic driving response. Hyperventilation was also performed, and this results in a minimal buildup of the background rhythm activities without significant slowing seen. At no time during the recording does there appear to be evidence of spike or spike wave discharges or evidence of focal slowing. EKG monitor shows no evidence of cardiac rhythm abnormalities with a heart rate of 60.  Impression: This is a normal EEG recording in the waking and sleeping state. No evidence of ictal or interictal discharges were seen at any time during the recording.

## 2016-01-06 DIAGNOSIS — R1013 Epigastric pain: Secondary | ICD-10-CM | POA: Diagnosis not present

## 2016-01-06 DIAGNOSIS — R112 Nausea with vomiting, unspecified: Secondary | ICD-10-CM | POA: Diagnosis not present

## 2016-01-06 DIAGNOSIS — K861 Other chronic pancreatitis: Secondary | ICD-10-CM | POA: Diagnosis not present

## 2016-01-06 DIAGNOSIS — Z86010 Personal history of colonic polyps: Secondary | ICD-10-CM | POA: Diagnosis not present

## 2016-01-06 DIAGNOSIS — K3184 Gastroparesis: Secondary | ICD-10-CM | POA: Diagnosis not present

## 2016-01-06 NOTE — Telephone Encounter (Signed)
I spoke to pt. Valerie [name] changed her cpap pressures to 5-15 cm H2O. However, pt is concerned because Dr. Brett Fairy said she was going to order 8-15 cm H2O. She has either been on 9 or 12 cm H2O while she has used cpap. She thinks that the pressure should be increased and thinks 5 cm will be too low.  Per Dr. Edwena Felty note, "Will change to auto papa , 8-15 cm water "  Ok to change to 8-15 cm H2O?

## 2016-01-06 NOTE — Addendum Note (Signed)
Addended by: Lester Whiteface A on: 01/06/2016 04:37 PM   Modules accepted: Orders

## 2016-01-06 NOTE — Telephone Encounter (Signed)
I spoke to pt and advised her that the cpap pressures will be changed to 8-15 cm H2O. I have sent the order to [facility]) and they should change it in the next couple of days. Pt is aware that tonight the pressures will still be 5-15 cm H2O.

## 2016-01-06 NOTE — Telephone Encounter (Signed)
Yes I would like 8-15 cm water thank you.

## 2016-01-06 NOTE — Telephone Encounter (Signed)
Patient called regarding new setting on CPAP machine, setting had been 12 up until today, was 9 before adjusted to 12 because she felt like she wasn't getting enough air, order now changed to 5-15 on demand setting (patient thought Dr. Brett Fairy said 9-15), [facility] advised at this setting, she will feel like she's sucking all of the air out of the machine. Please call [phone].

## 2016-01-07 ENCOUNTER — Telehealth: Payer: Self-pay | Admitting: *Deleted

## 2016-01-07 NOTE — Telephone Encounter (Signed)
Spoke with pt relayed that EEG normal study.  She verbalized understanding.

## 2016-01-07 NOTE — Telephone Encounter (Signed)
-----   Message from Larey Seat, MD sent at 01/06/2016  4:51 PM EDT ----- Normal EEG , per dr Jannifer Franklin. CD

## 2016-01-08 ENCOUNTER — Other Ambulatory Visit: Payer: Self-pay | Admitting: Family Medicine

## 2016-01-09 ENCOUNTER — Other Ambulatory Visit: Payer: Self-pay | Admitting: Urgent Care

## 2016-01-09 DIAGNOSIS — Z9109 Other allergy status, other than to drugs and biological substances: Secondary | ICD-10-CM

## 2016-01-10 ENCOUNTER — Ambulatory Visit (INDEPENDENT_AMBULATORY_CARE_PROVIDER_SITE_OTHER): Payer: Medicare Other

## 2016-01-10 ENCOUNTER — Ambulatory Visit (INDEPENDENT_AMBULATORY_CARE_PROVIDER_SITE_OTHER): Payer: Medicare Other | Admitting: Emergency Medicine

## 2016-01-10 VITALS — BP 112/68 | HR 78 | Temp 98.3°F | Resp 18

## 2016-01-10 DIAGNOSIS — M791 Myalgia: Secondary | ICD-10-CM

## 2016-01-10 DIAGNOSIS — I10 Essential (primary) hypertension: Secondary | ICD-10-CM | POA: Diagnosis not present

## 2016-01-10 DIAGNOSIS — S93402A Sprain of unspecified ligament of left ankle, initial encounter: Secondary | ICD-10-CM

## 2016-01-10 DIAGNOSIS — N289 Disorder of kidney and ureter, unspecified: Secondary | ICD-10-CM

## 2016-01-10 DIAGNOSIS — R400 Somnolence: Secondary | ICD-10-CM

## 2016-01-10 DIAGNOSIS — K921 Melena: Secondary | ICD-10-CM | POA: Diagnosis not present

## 2016-01-10 DIAGNOSIS — M25569 Pain in unspecified knee: Secondary | ICD-10-CM

## 2016-01-10 DIAGNOSIS — M609 Myositis, unspecified: Secondary | ICD-10-CM | POA: Diagnosis not present

## 2016-01-10 DIAGNOSIS — R410 Disorientation, unspecified: Secondary | ICD-10-CM | POA: Diagnosis not present

## 2016-01-10 DIAGNOSIS — G894 Chronic pain syndrome: Secondary | ICD-10-CM

## 2016-01-10 DIAGNOSIS — IMO0001 Reserved for inherently not codable concepts without codable children: Secondary | ICD-10-CM

## 2016-01-10 DIAGNOSIS — R5382 Chronic fatigue, unspecified: Secondary | ICD-10-CM

## 2016-01-10 LAB — POCT URINALYSIS DIP (MANUAL ENTRY)
Bilirubin, UA: NEGATIVE
Blood, UA: NEGATIVE
GLUCOSE UA: NEGATIVE
Ketones, POC UA: NEGATIVE
LEUKOCYTES UA: NEGATIVE
NITRITE UA: NEGATIVE
PROTEIN UA: NEGATIVE
Spec Grav, UA: <=1.005
UROBILINOGEN UA: 0.2
pH, UA: 5.5

## 2016-01-10 LAB — COMPLETE METABOLIC PANEL WITH GFR
ALT: 11 U/L (ref 6–29)
AST: 12 U/L (ref 10–35)
Albumin: 4.4 g/dL (ref 3.6–5.1)
Alkaline Phosphatase: 44 U/L (ref 33–130)
BUN: 24 mg/dL (ref 7–25)
CALCIUM: 9.6 mg/dL (ref 8.6–10.4)
CHLORIDE: 105 mmol/L (ref 98–110)
CO2: 23 mmol/L (ref 20–31)
CREATININE: 1.28 mg/dL — AB (ref 0.50–1.05)
GFR, EST AFRICAN AMERICAN: 55 mL/min — AB (ref >=60)
GFR, EST NON AFRICAN AMERICAN: 48 mL/min — AB (ref >=60)
Glucose, Bld: 108 mg/dL — ABNORMAL HIGH (ref 65–99)
Potassium: 4.7 mmol/L (ref 3.5–5.3)
Sodium: 135 mmol/L (ref 135–146)
Total Bilirubin: 0.3 mg/dL (ref 0.2–1.2)
Total Protein: 7 g/dL (ref 6.1–8.1)

## 2016-01-10 LAB — POC MICROSCOPIC URINALYSIS (UMFC): MUCUS RE: ABSENT

## 2016-01-10 LAB — C-REACTIVE PROTEIN: CRP: 2.1 mg/dL — AB (ref <0.60)

## 2016-01-10 LAB — CK: Total CK: 45 U/L (ref 7–177)

## 2016-01-10 LAB — SEDIMENTATION RATE: Sed Rate: 28 mm/hr (ref 0–30)

## 2016-01-10 NOTE — Telephone Encounter (Signed)
Dr Everlene Farrier, I see pt has appt with you today. Please address this RF at [place].
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
## 2016-01-10 NOTE — Patient Instructions (Signed)
     IF you received an x-ray today, you will receive an invoice from [facility]. Please contact [facility] at [phone] with questions or concerns regarding your invoice.   IF you received labwork today, you will receive an invoice from [facility]. Please contact [facility] at [phone] with questions or concerns regarding your invoice.   Our billing staff will not be able to assist you with questions regarding bills from these companies.  You will be contacted with the lab results as soon as they are available. The fastest way to get your results is to activate your My Chart account. Instructions are located on the last page of this paperwork. If you have not heard from us regarding the results in 2 weeks, please contact this office.      

## 2016-01-10 NOTE — Progress Notes (Deleted)
    MRN: [id number] DOB: [birth year]  Subjective:   Valerie [name] is a 55 y.o. female presenting for chief complaint of Hospitalization Follow-up (acute renal failure); Knee Injury (fall, x 2 weeks); and Ankle Injury  Patient was seen at our clinic initially on 12/27/2015  Valerie [name] has a current medication list which includes the following prescription(s): albuterol, allopurinol, alprazolam, amlodipine, baclofen, benazepril, biotin, cetirizine, vitamin d, clotrimazole-betamethasone, dexlansoprazole, fexofenadine, fluticasone, hydrocodone-acetaminophen, metoprolol succinate, modafinil, montelukast, morphine, multiple vitamins-minerals, olopatadine hcl, pancrelipase (lip-prot-amyl), prednisone, promethazine, spironolactone, sucralfate, ciprofloxacin, and morphine. Also is allergic to dilaudid [hydromorphone hcl]; pregabalin; metoclopramide hcl; neurontin [gabapentin]; other; and reglan [metoclopramide].  Valerie [name]  has a past medical history of Acute ischemic colitis ([place]); Anemia (05/01/2012); Anxiety; ARF (acute renal failure) ([place]) (12/28/2015); Arthritis; Asthma; Basal cell carcinoma of upper back (~ 2011); Chronic lower back pain; DDD (degenerative disc disease), lumbar; Diarrhea; Fibromyalgia; First degree heart block; Gastro-esophageal reflux; Headache(784.0); Hypertension; Mental disorder; Nerve pain; Neuromuscular disorder ([place]); Pancreatitis; Pneumonia (~ 2014; 08/2015); Polymyalgia rheumatica ([place]) (onset 08/2015); Precancerous melanosis; and Sleep apnea. Also  has a past surgical history that includes Shoulder arthroscopy with rotator cuff repair and open biceps tenodesis (Bilateral, 2010-2013); Nasal septoplasty w/ turbinoplasty (1998); Esophagogastroduodenoscopy (12/21/2011); Flexible sigmoidoscopy (05/01/2012); Excision basal cell carcinoma (~ 2011); Esophagogastroduodenoscopy (N/A, 11/06/2012); Botox injection (N/A, 11/06/2012); Esophagogastroduodenoscopy (N/A, 12/04/2013); Botox injection (N/A,  12/04/2013); Esophagogastroduodenoscopy (N/A, 02/26/2014); Botox injection (N/A, 02/26/2014); Esophagogastroduodenoscopy (egd) with propofol (N/A, 07/08/2015); Botox injection (N/A, 07/08/2015); and Laparoscopic cholecystectomy (01/2002).  Objective:   Vitals: BP 112/68 (BP Location: Left Arm, Patient Position: Sitting, Cuff Size: Normal)   Pulse 78   Temp 98.3 F (36.8 C)   Resp 18   SpO2 98%   Physical Exam  No results found for this or any previous visit (from the past 24 hour(s)).  Assessment and Plan :     [name], PA-C [facility] [phone] 01/10/2016 12:48 PM

## 2016-01-11 ENCOUNTER — Telehealth: Payer: Self-pay

## 2016-01-11 ENCOUNTER — Telehealth: Payer: Self-pay | Admitting: Emergency Medicine

## 2016-01-11 DIAGNOSIS — E669 Obesity, unspecified: Secondary | ICD-10-CM | POA: Diagnosis not present

## 2016-01-11 DIAGNOSIS — M79604 Pain in right leg: Secondary | ICD-10-CM | POA: Diagnosis not present

## 2016-01-11 DIAGNOSIS — F332 Major depressive disorder, recurrent severe without psychotic features: Secondary | ICD-10-CM | POA: Diagnosis not present

## 2016-01-11 DIAGNOSIS — M461 Sacroiliitis, not elsewhere classified: Secondary | ICD-10-CM | POA: Diagnosis not present

## 2016-01-11 DIAGNOSIS — M79605 Pain in left leg: Secondary | ICD-10-CM | POA: Diagnosis not present

## 2016-01-11 DIAGNOSIS — G4733 Obstructive sleep apnea (adult) (pediatric): Secondary | ICD-10-CM | POA: Diagnosis not present

## 2016-01-11 DIAGNOSIS — F329 Major depressive disorder, single episode, unspecified: Secondary | ICD-10-CM | POA: Diagnosis not present

## 2016-01-11 DIAGNOSIS — M79601 Pain in right arm: Secondary | ICD-10-CM | POA: Diagnosis not present

## 2016-01-11 DIAGNOSIS — Z79891 Long term (current) use of opiate analgesic: Secondary | ICD-10-CM | POA: Diagnosis not present

## 2016-01-11 DIAGNOSIS — M545 Low back pain: Secondary | ICD-10-CM | POA: Diagnosis not present

## 2016-01-11 LAB — ANA: ANA: NEGATIVE

## 2016-01-11 LAB — THYROID PANEL WITH TSH
Free Thyroxine Index: 2.4 (ref 1.4–3.8)
T3 UPTAKE: 31 % (ref 22–35)
T4 TOTAL: 7.8 ug/dL (ref 4.5–12.0)
TSH: 2.16 m[IU]/L

## 2016-01-11 LAB — CBC WITH DIFFERENTIAL/PLATELET
BASOS ABS: 85 {cells}/uL (ref 0–200)
Basophils Relative: 1 %
EOS ABS: 170 {cells}/uL (ref 15–500)
EOS PCT: 2 %
HCT: 38.9 % (ref 35.0–45.0)
Hemoglobin: 12.5 g/dL (ref 11.7–15.5)
LYMPHS PCT: 32 %
Lymphs Abs: 2720 cells/uL (ref 850–3900)
MCH: 29.8 pg (ref 27.0–33.0)
MCHC: 32.1 g/dL (ref 32.0–36.0)
MCV: 92.8 fL (ref 80.0–100.0)
MONOS PCT: 8 %
MPV: 10.7 fL (ref 7.5–12.5)
Monocytes Absolute: 680 cells/uL (ref 200–950)
NEUTROS PCT: 57 %
Neutro Abs: 4845 cells/uL (ref 1500–7800)
PLATELETS: 323 10*3/uL (ref 140–400)
RBC: 4.19 MIL/uL (ref 3.80–5.10)
RDW: 14.1 % (ref 11.0–15.0)
WBC: 8.5 10*3/uL (ref 3.8–10.8)

## 2016-01-11 LAB — ANCA SCREEN W REFLEX TITER: ANCA SCREEN: POSITIVE — AB

## 2016-01-11 LAB — RFLX P-ANCA TITER: P-ANCA: 1:80 {titer} — ABNORMAL HIGH

## 2016-01-11 LAB — HEPATITIS C ANTIBODY: HCV AB: NEGATIVE

## 2016-01-11 NOTE — Telephone Encounter (Signed)
-----   Message from Darlyne Russian, MD sent at 01/11/2016  8:32 AM EDT ----- Labs are improving. Her muscle enzymes and kidney function are returning to normal. I would like to get her back to see Dr. Amil Amen.

## 2016-01-11 NOTE — Telephone Encounter (Signed)
Pt given test results Requesting suggestion of different Rheumatologist specialist. States, she did care for Dr. Amil Amen. Medication also RF Baclofen and Flonase Please f/u

## 2016-01-12 ENCOUNTER — Other Ambulatory Visit: Payer: Self-pay | Admitting: Emergency Medicine

## 2016-01-12 DIAGNOSIS — R768 Other specified abnormal immunological findings in serum: Secondary | ICD-10-CM

## 2016-01-12 DIAGNOSIS — Z9109 Other allergy status, other than to drugs and biological substances: Secondary | ICD-10-CM

## 2016-01-12 LAB — IMMUNOFIXATION ELECTROPHORESIS
IGA: 119 mg/dL (ref 81–463)
IGG (IMMUNOGLOBIN G), SERUM: 1212 mg/dL (ref 694–1618)
IgM, Serum: 113 mg/dL (ref 48–271)

## 2016-01-12 LAB — PROTEIN ELECTROPHORESIS, SERUM
ALPHA-2-GLOBULIN: 0.9 g/dL (ref 0.5–0.9)
Albumin ELP: 3.9 g/dL (ref 3.8–4.8)
Alpha-1-Globulin: 0.3 g/dL (ref 0.2–0.3)
BETA GLOBULIN: 0.5 g/dL (ref 0.4–0.6)
Beta 2: 0.4 g/dL (ref 0.2–0.5)
GAMMA GLOBULIN: 1.1 g/dL (ref 0.8–1.7)
Total Protein, Serum Electrophoresis: 7 g/dL (ref 6.1–8.1)

## 2016-01-12 MED ORDER — FLUTICASONE PROPIONATE 50 MCG/ACT NA SUSP: 2.0000 | Freq: Every day | NASAL | 2 refills | Status: DC

## 2016-01-12 MED ORDER — BACLOFEN 10 MG PO TABS: ORAL_TABLET | ORAL | 2 refills | Status: DC

## 2016-01-12 NOTE — Telephone Encounter (Signed)
Spoke with patient and advised about Dr. Amil Amen. Patient understood.

## 2016-01-12 NOTE — Telephone Encounter (Signed)
I would suggest she see Dr. Amil Amen and discuss with him possible referral to a [facility] such as [facility] or [facility]  to evaluate her situation.

## 2016-01-13 ENCOUNTER — Other Ambulatory Visit: Payer: Self-pay | Admitting: *Deleted

## 2016-01-14 ENCOUNTER — Other Ambulatory Visit: Payer: Self-pay | Admitting: *Deleted

## 2016-01-14 ENCOUNTER — Encounter: Payer: Self-pay | Admitting: *Deleted

## 2016-01-14 DIAGNOSIS — R768 Other specified abnormal immunological findings in serum: Secondary | ICD-10-CM

## 2016-01-14 NOTE — Telephone Encounter (Signed)
The patient called and states that she was able to schedule an appointment with Dr Charlestine Night, rheumatologist, on 01/20/16 at 1:45pm.

## 2016-01-20 DIAGNOSIS — M79604 Pain in right leg: Secondary | ICD-10-CM | POA: Diagnosis not present

## 2016-01-20 DIAGNOSIS — M79605 Pain in left leg: Secondary | ICD-10-CM | POA: Diagnosis not present

## 2016-01-20 DIAGNOSIS — N189 Chronic kidney disease, unspecified: Secondary | ICD-10-CM | POA: Diagnosis not present

## 2016-01-20 DIAGNOSIS — M79642 Pain in left hand: Secondary | ICD-10-CM | POA: Diagnosis not present

## 2016-01-20 DIAGNOSIS — M79641 Pain in right hand: Secondary | ICD-10-CM | POA: Diagnosis not present

## 2016-01-20 DIAGNOSIS — R799 Abnormal finding of blood chemistry, unspecified: Secondary | ICD-10-CM | POA: Diagnosis not present

## 2016-01-20 DIAGNOSIS — G4730 Sleep apnea, unspecified: Secondary | ICD-10-CM | POA: Diagnosis not present

## 2016-01-21 ENCOUNTER — Encounter: Payer: Self-pay | Admitting: Emergency Medicine

## 2016-01-21 ENCOUNTER — Ambulatory Visit (INDEPENDENT_AMBULATORY_CARE_PROVIDER_SITE_OTHER): Payer: Medicare Other | Admitting: Emergency Medicine

## 2016-01-21 ENCOUNTER — Ambulatory Visit
Admission: RE | Admit: 2016-01-21 | Discharge: 2016-01-21 | Disposition: A | Discharge status: Home / Self Care | Payer: Medicare Other | Source: Ambulatory Visit | Attending: Emergency Medicine | Admitting: Emergency Medicine

## 2016-01-21 VITALS — BP 118/78 | HR 66 | Temp 98.1°F | Resp 18 | Ht 64.0 in | Wt 275.0 lb

## 2016-01-21 DIAGNOSIS — R748 Abnormal levels of other serum enzymes: Secondary | ICD-10-CM

## 2016-01-21 DIAGNOSIS — M609 Myositis, unspecified: Secondary | ICD-10-CM

## 2016-01-21 DIAGNOSIS — M791 Myalgia: Secondary | ICD-10-CM | POA: Diagnosis not present

## 2016-01-21 DIAGNOSIS — G894 Chronic pain syndrome: Secondary | ICD-10-CM | POA: Diagnosis not present

## 2016-01-21 DIAGNOSIS — N289 Disorder of kidney and ureter, unspecified: Secondary | ICD-10-CM

## 2016-01-21 DIAGNOSIS — N179 Acute kidney failure, unspecified: Secondary | ICD-10-CM | POA: Diagnosis not present

## 2016-01-21 DIAGNOSIS — M79662 Pain in left lower leg: Secondary | ICD-10-CM

## 2016-01-21 DIAGNOSIS — M25569 Pain in unspecified knee: Secondary | ICD-10-CM

## 2016-01-21 DIAGNOSIS — M79661 Pain in right lower leg: Secondary | ICD-10-CM | POA: Diagnosis not present

## 2016-01-21 DIAGNOSIS — IMO0001 Reserved for inherently not codable concepts without codable children: Secondary | ICD-10-CM

## 2016-01-21 LAB — POCT CBC
GRANULOCYTE PERCENT: 64.9 % (ref 37–80)
HCT, POC: 39.2 % (ref 37.7–47.9)
HEMOGLOBIN: 13.4 g/dL (ref 12.2–16.2)
LYMPH, POC: 1.9 (ref 0.6–3.4)
MCH, POC: 30.2 pg (ref 27–31.2)
MCHC: 34.1 g/dL (ref 31.8–35.4)
MCV: 88.6 fL (ref 80–97)
MID (cbc): 0.4 (ref 0–0.9)
MPV: 8.2 fL (ref 0–99.8)
PLATELET COUNT, POC: 251 10*3/uL (ref 142–424)
POC GRANULOCYTE: 4.3 (ref 2–6.9)
POC LYMPH %: 28.9 % (ref 10–50)
POC MID %: 6.2 %M (ref 0–12)
RBC: 4.43 M/uL (ref 4.04–5.48)
RDW, POC: 14.1 %
WBC: 6.7 10*3/uL (ref 4.6–10.2)

## 2016-01-21 LAB — BASIC METABOLIC PANEL WITH GFR
BUN: 18 mg/dL (ref 7–25)
CALCIUM: 10.3 mg/dL (ref 8.6–10.4)
CHLORIDE: 102 mmol/L (ref 98–110)
CO2: 28 mmol/L (ref 20–31)
CREATININE: 1.18 mg/dL — AB (ref 0.50–1.05)
GFR, EST NON AFRICAN AMERICAN: 53 mL/min — AB (ref >=60)
GFR, Est African American: 61 mL/min (ref >=60)
Glucose, Bld: 92 mg/dL (ref 65–99)
Potassium: 4.8 mmol/L (ref 3.5–5.3)
SODIUM: 139 mmol/L (ref 135–146)

## 2016-01-21 LAB — SEDIMENTATION RATE: Sed Rate: 28 mm/hr (ref 0–30)

## 2016-01-21 LAB — CK: Total CK: 47 U/L (ref 7–177)

## 2016-01-21 NOTE — Progress Notes (Signed)
Patient ID: Valerie [name], female   DOB: Jul 18, 1960, 55 y.o.   MRN: [id number]    By signing my name below, I, Essence Howell, attest that this documentation has been prepared under the direction and in the presence of Darlyne Russian, MD Electronically Signed: Ladene Artist, ED Scribe 01/21/2016 at 10:45 AM.  Chief Complaint:  Chief Complaint  Patient presents with  . Follow-up    from 12/27/15 and 01/10/16 visits for confusion   HPI: Valerie [name] is a 55 y.o. female, with a h/o fibromyalgia, who reports to [facility] today for a follow-up. Pt was seen in the office on 7/17 and 7/31 following hospitalization follow-up regarding acute renal failure. She was referred to rheumatologist Donalynn Furlong who she saw yesterday but states that he felt like her abnormal ANCA was due to a flare up of her fibromyalgia. She states that he does not plan to follow up with her. Today, she reports persistent fatigue and bilateral lower extremity pain that is exacerbated with palpation and walking up stairs. She has had a doppler done ~5-6 years ago for inflammation to her lower extremities from an antiinflammatory that she had taken. Pt states that she is compliant with her c-pap. She has a kidney US scheduled this afternoon.   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  Past Medical History:  Diagnosis Date  . Acute ischemic colitis ([place])   . Anemia 05/01/2012  . Anxiety   . ARF (acute renal failure) ([place]) 12/28/2015  . Arthritis    "basically ~ q joint" (12/28/2015)  . Asthma   . Basal cell carcinoma of upper back ~ 2011  . Chronic lower back pain   . DDD (degenerative disc disease), lumbar   . Diarrhea    EPISODIC  . Fibromyalgia   . First degree heart block   . Gastro-esophageal reflux   . Headache(784.0)    "muscular w/migraine intensity; ~ 5-6 months" (12/28/2015)  . Hypertension   . Mental disorder   . Nerve pain    nerve damage to right arm  . Neuromuscular  disorder ([place])   . Pancreatitis   . Pneumonia ~ 2014; 08/2015  . Polymyalgia rheumatica ([place]) onset 08/2015  . Precancerous melanosis    "2nd toe left foot; 5th toe of right foot; right temple; had them all cut off"  . Sleep apnea    CPAP-9   Past Surgical History:  Procedure Laterality Date  . BASAL CELL CARCINOMA EXCISION  ~ 2011   back  . BOTOX INJECTION N/A 11/06/2012   Procedure: BOTOX INJECTION;  Surgeon: Missy Sabins, MD;  Location: [facility];  Service: Endoscopy;  Laterality: N/A;  . BOTOX INJECTION N/A 12/04/2013   Procedure: BOTOX INJECTION;  Surgeon: Missy Sabins, MD;  Location: [place];  Service: Endoscopy;  Laterality: N/A;  . BOTOX INJECTION N/A 02/26/2014   Procedure: BOTOX INJECTION;  Surgeon: Missy Sabins, MD;  Location: [place];  Service: Endoscopy;  Laterality: N/A;  . BOTOX INJECTION N/A 07/08/2015   Procedure: BOTOX INJECTION;  Surgeon: Teena Irani, MD;  Location: [facility];  Service: Endoscopy;  Laterality: N/A;  . ESOPHAGOGASTRODUODENOSCOPY  12/21/2011   Procedure: ESOPHAGOGASTRODUODENOSCOPY (EGD);  Surgeon: Missy Sabins, MD;  Location: [facility] ENDOSCOPY;  Service: Endoscopy;  Laterality: N/A;  . ESOPHAGOGASTRODUODENOSCOPY N/A 11/06/2012   Procedure: ESOPHAGOGASTRODUODENOSCOPY (EGD);  Surgeon: Missy Sabins, MD;  Location: [facility];  Service: Endoscopy;  Laterality: N/A;  .  ESOPHAGOGASTRODUODENOSCOPY N/A 12/04/2013   Procedure: ESOPHAGOGASTRODUODENOSCOPY (EGD);  Surgeon: Missy Sabins, MD;  Location: [facility] ENDOSCOPY;  Service: Endoscopy;  Laterality: N/A;  . ESOPHAGOGASTRODUODENOSCOPY N/A 02/26/2014   Procedure: ESOPHAGOGASTRODUODENOSCOPY (EGD);  Surgeon: Missy Sabins, MD;  Location: [facility] ENDOSCOPY;  Service: Endoscopy;  Laterality: N/A;  . ESOPHAGOGASTRODUODENOSCOPY (EGD) WITH PROPOFOL N/A 07/08/2015   Procedure: ESOPHAGOGASTRODUODENOSCOPY (EGD) WITH PROPOFOL;  Surgeon: Teena Irani, MD;  Location: [facility];  Service: Endoscopy;  Laterality: N/A;  . FLEXIBLE  SIGMOIDOSCOPY  05/01/2012   Procedure: FLEXIBLE SIGMOIDOSCOPY;  Surgeon: Jeryl Columbia, MD;  Location: [facility];  Service: Endoscopy;  Laterality: N/A;  unprepped  . LAPAROSCOPIC CHOLECYSTECTOMY  01/2002  . NASAL SEPTOPLASTY W/ TURBINOPLASTY  1998  . SHOULDER ARTHROSCOPY WITH ROTATOR CUFF REPAIR AND OPEN BICEPS TENODESIS Bilateral 2010-2013   left-right   Social History   Social History  . Marital status: Single    Spouse name: N/A  . Number of children: 0  . Years of education: College   Occupational History  . disabled    Social History Main Topics  . Smoking status: Never Smoker  . Smokeless tobacco: Never Used  . Alcohol use No  . Drug use: No  . Sexual activity: Not Currently   Other Topics Concern  . None   Social History Narrative   Caffeine 2 cups iced tea daily avg.   Family History  Problem Relation Age of Onset  . Heart disease Mother   . COPD Mother   . Arthritis Mother   . Cancer Mother   . Hypertension Mother   . Arthritis Paternal Grandmother   . Heart disease Paternal Grandmother   . Heart disease Paternal Grandfather    Allergies  Allergen Reactions  . Dilaudid [Hydromorphone Hcl] Other (See Comments)    Headache   . Pregabalin   . Metoclopramide Hcl Other (See Comments)    "crawling inside"  . Neurontin [Gabapentin] Other (See Comments)    Tremors   . Other Other (See Comments)    Avoid all SSRI's and SNRI's- damaged pancreas  . Reglan [Metoclopramide]    Prior to Admission medications   Medication Sig Start Date End Date Taking? Authorizing Provider  albuterol (PROVENTIL HFA;VENTOLIN HFA) 108 (90 BASE) MCG/ACT inhaler Inhale 2 puffs into the lungs as needed for wheezing or shortness of breath. 03/04/15  Yes [name], MD  allopurinol (ZYLOPRIM) 300 MG tablet Take 0.5 tablets (150 mg total) by mouth daily. 11/24/15  Yes [name], MD  ALPRAZolam Duanne Moron) 1 MG tablet Take 1 tablet (1 mg total) by mouth 3 (three) times daily as  needed for anxiety. 07/31/15  Yes [name], MD  amLODipine (NORVASC) 2.5 MG tablet Take 1 tablet (2.5 mg total) by mouth daily. 07/17/15  Yes [name], MD  baclofen (LIORESAL) 10 MG tablet TAKE 1 TABLET (10 MG TOTAL) BY MOUTH 3 (THREE TIMES DAILY) 01/12/16  Yes Darlyne Russian, MD  benazepril (LOTENSIN) 40 MG tablet  12/22/15  Yes Historical Provider, MD  Biotin 5000 MCG CAPS Take 10,000 mcg by mouth daily.    Yes Historical Provider, MD  cetirizine (ZYRTEC) 10 MG tablet Take 10 mg by mouth daily.   Yes Historical Provider, MD  Cholecalciferol (VITAMIN D) 2000 UNITS tablet Take 2,000 Units by mouth daily.   Yes Historical Provider, MD  clotrimazole-betamethasone (LOTRISONE) cream Apply 1 application topically 2 (two) times daily. 11/24/15  Yes [name], MD  dexlansoprazole (DEXILANT) 60 MG capsule Take 60 mg by mouth  2 (two) times daily.   Yes Historical Provider, MD  fexofenadine (ALLEGRA) 180 MG tablet Take 180 mg by mouth daily. Reported on 07/17/2015   Yes Historical Provider, MD  fluticasone (FLONASE) 50 MCG/ACT nasal spray Place 2 sprays into both nostrils daily. 01/12/16  Yes Darlyne Russian, MD  HYDROcodone-acetaminophen (NORCO) 10-325 MG per tablet Take 1-2 tablets by mouth every 6 (six) hours as needed (pain.).   Yes Historical Provider, MD  metoprolol succinate (TOPROL-XL) 100 MG 24 hr tablet Take 1 tablet (100 mg total) by mouth daily. Take with or immediately following a meal. 07/31/15  Yes [name], MD  modafinil (PROVIGIL) 200 MG tablet Take 0.5 tablets (100 mg total) by mouth 2 (two) times daily. 12/30/15  Yes Carmen Dohmeier, MD  montelukast (SINGULAIR) 10 MG tablet TAKE 1 TABLET (10 MG TOTAL) BY MOUTH AT BEDTIME 11/24/15  Yes [name], MD  morphine (MS CONTIN) 30 MG 12 hr tablet Take 30 mg by mouth every 8 (eight) hours. Not from [facility] 09/23/12  Yes Historical Provider, MD  Multiple Vitamins-Minerals (MULTIVITAMIN PO) Take 1 tablet by mouth daily.   Yes Historical  Provider, MD  Olopatadine HCl (PATADAY) 0.2 % SOLN APPLY 1 DROP TO EYE 2 (TWO) TIMES DAILY. 07/05/15  Yes [name], MD  Pancrelipase, Lip-Prot-Amyl, (CREON) 24000 UNITS CPEP Take 48,000 Units by mouth 4 (four) times daily as needed (for pancreatitis).   Yes Historical Provider, MD  promethazine (PHENERGAN) 25 MG tablet Take 1 tablet (25 mg total) by mouth every 6 (six) hours as needed for nausea or vomiting. 12/27/14  Yes [name], MD  spironolactone (ALDACTONE) 100 MG tablet Take 1 tablet (100 mg total) by mouth daily. 11/24/15  Yes [name], MD  sucralfate (CARAFATE) 1 GM/10ML suspension Take 10 mLs (1 g total) by mouth 4 (four) times daily -  with meals and at bedtime. 05/01/15  Yes [name], MD  morphine (MS CONTIN) 60 MG 12 hr tablet  12/15/15   Historical Provider, MD  predniSONE (DELTASONE) 10 MG tablet  10/13/15   Historical Provider, MD   ROS: The patient denies fevers, chills, night sweats, unintentional weight loss, chest pain, palpitations, wheezing, dyspnea on exertion, nausea, vomiting, abdominal pain, dysuria, hematuria, melena, numbness, weakness, or tingling.   All other systems have been reviewed and were otherwise negative with the exception of those mentioned in the HPI and as above.    PHYSICAL EXAM: Vitals:   01/21/16 0933  BP: 118/78  Pulse: 66  Resp: 18  Temp: 98.1 F (36.7 C)   Body mass index is 47.2 kg/m.  General: Alert, no acute distress. Sad but cooperative  HEENT:  Normocephalic, atraumatic, oropharynx patent. Eye: Juliette Mangle [facility] Cardiovascular:  Regular rate and rhythm, no rubs murmurs or gallops.  No Carotid bruits, radial pulse intact. No pedal edema.  Respiratory: Clear to auscultation bilaterally.  No wheezes, rales, or rhonchi.  No cyanosis, no use of accessory musculature Abdominal: No organomegaly, abdomen is soft and non-tender, positive bowel sounds.  No masses. Musculoskeletal: Gait intact. No edema. Mild tenderness to both  calves but no obvious swelling. Pulses are 2+ Skin: No rashes. Clammy feel  Neurologic: Facial musculature symmetric. Psychiatric: Patient acts appropriately throughout our interaction. Lymphatic: No cervical or submandibular lymphadenopathy  LABS: Results for orders placed or performed in visit on 01/21/16  POCT CBC  Result Value Ref Range   WBC 6.7 4.6 - 10.2 K/uL   Lymph, poc 1.9 0.6 - 3.4   POC  LYMPH PERCENT 28.9 10 - 50 %L   MID (cbc) 0.4 0 - 0.9   POC MID % 6.2 0 - 12 %M   POC Granulocyte 4.3 2 - 6.9   Granulocyte percent 64.9 37 - 80 %G   RBC 4.43 4.04 - 5.48 M/uL   Hemoglobin 13.4 12.2 - 16.2 g/dL   HCT, POC 39.2 37.7 - 47.9 %   MCV 88.6 80 - 97 fL   MCH, POC 30.2 27 - 31.2 pg   MCHC 34.1 31.8 - 35.4 g/dL   RDW, POC 14.1 %   Platelet Count, POC 251 142 - 424 K/uL   MPV 8.2 0 - 99.8 fL    EKG/XRAY:   Primary read interpreted by Dr. Everlene Farrier at [facility].  ASSESSMENT/PLAN: Repeat labs done today. Referral made back to a different rheumatology practice for their opinion. We'll go ahead and repeat kidney function tests as well as muscle enzymes. Doppler ordered of both legs. Encouraged her to try a water exercise as her form of exercise.I personally performed the services described in this documentation, which was scribed in my presence. The recorded information has been reviewed and is accurate.   Gross sideeffects, risk and benefits, and alternatives of medications d/w patient. Patient is aware that all medications have potential sideeffects and we are unable to predict every sideeffect or drug-drug interaction that may occur.  Arlyss Queen MD 01/21/2016 9:54 AM

## 2016-01-21 NOTE — Patient Instructions (Signed)
     IF you received an x-ray today, you will receive an invoice from [facility]. Please contact [facility] at [phone] with questions or concerns regarding your invoice.   IF you received labwork today, you will receive an invoice from [facility]. Please contact [facility] at [phone] with questions or concerns regarding your invoice.   Our billing staff will not be able to assist you with questions regarding bills from these companies.  You will be contacted with the lab results as soon as they are available. The fastest way to get your results is to activate your My Chart account. Instructions are located on the last page of this paperwork. If you have not heard from us regarding the results in 2 weeks, please contact this office.      

## 2016-01-25 ENCOUNTER — Ambulatory Visit (INDEPENDENT_AMBULATORY_CARE_PROVIDER_SITE_OTHER): Payer: Medicare Other | Admitting: Family Medicine

## 2016-01-25 VITALS — BP 137/89 | HR 89 | Temp 98.9°F | Resp 16 | Ht 65.0 in | Wt 276.4 lb

## 2016-01-25 DIAGNOSIS — M2550 Pain in unspecified joint: Secondary | ICD-10-CM | POA: Diagnosis not present

## 2016-01-25 DIAGNOSIS — M609 Myositis, unspecified: Secondary | ICD-10-CM

## 2016-01-25 DIAGNOSIS — M791 Myalgia: Secondary | ICD-10-CM

## 2016-01-25 DIAGNOSIS — IMO0001 Reserved for inherently not codable concepts without codable children: Secondary | ICD-10-CM

## 2016-01-25 MED ORDER — PREDNISONE 10 MG PO TABS: 10.0000 mg | ORAL_TABLET | Freq: Every day | ORAL | 0 refills | Status: DC

## 2016-01-25 NOTE — Progress Notes (Signed)
55 yo woman with multiple problems including hypertension, sleep apnea, fibromyalgia, obesity presented back in March with fatigue.  She continued to experience the fatigue along with myalgia in the context of a transient bronchitis.  The joints gradually worsened, precipitating a course of prednisone which helped on two brief courses.   She then had the pains return in July and on July 18 she rapidly went downhill and became obtunded.  She was hospitalized with ARF and high CK.  The hospitalist told her that she was dehydrated and had too much morphine in her system.  Over several days, the renal failure cleared and the CK came back to normal.  She was seen here as an outpatient and her sed rate was noted to be elevated along with a rheumatoid arthritis test.  Last week she saw the rheumatologist who said she had fibromyalgia and no other rheumatoid problem.  She wanted to see her original rheumatologist Dr. Estanislado Pandy, but this was denied because she had seen Dr. Charlestine Night for the second opinion.  She is awaiting a renal consultation here and rheumatological evaluation at [facility].  She also saw Dr. Amedeo Plenty one week after [facility] discharge and she had an elevated lipase.  Objective:  BP 137/89 (BP Location: Left Wrist, Patient Position: Sitting, Cuff Size: Normal)   Pulse 89   Temp 98.9 F (37.2 C) (Oral)   Resp 16   Ht 5\' 5"  (1.651 m)   Wt 276 lb 6.4 oz (125.4 kg)   SpO2 98%   BMI 46.00 kg/m    HEENT:  No obvious abnormalities on inspection of eyes, oroph, ears Neck: supple, no adenop or thyromeg Skin:  No rash Chest: clear Heart:  Regular, no murmur or gallop Abdomen:  Soft, nontender, no HSM, no mass Ext:  No edema  p-ANCA positive on 01/10/16 at 1:80 CK on 8/11 was normal at 47 Notes Recorded by Darlyne Russian, MD on 01/22/2016 at 8:34 AM EDT Labs stable. Kidney function stable. Muscle enzymes normal.   Ref Range & Units 4d ago  Sodium 135 - 146 mmol/L 139  Potassium 3.5 - 5.3 mmol/L  4.8  Chloride 98 - 110 mmol/L 102  CO2 20 - 31 mmol/L 28  Glucose, Bld 65 - 99 mg/dL 92  BUN 7 - 25 mg/dL 18  Creat 0.50 - 1.05 mg/dL 1.18         Ref Range & Units 4d ago  WBC 4.6 - 10.2 K/uL 6.7  Lymph, poc 0.6 - 3.4 1.9  POC LYMPH PERCENT 10 - 50 %L 28.9  MID (cbc) 0 - 0.9 0.4  POC MID % 0 - 12 %M 6.2  POC Granulocyte 2 - 6.9 4.3  Granulocyte percent 37 - 80 %G 64.9  RBC 4.04 - 5.48 M/uL 4.43  Hemoglobin 12.2 - 16.2 g/dL 13.4  HCT, POC 37.7 - 47.9 % 39.2  MCV 80 - 97 fL 88.6  MCH, POC 27 - 31.2 pg 30.2  MCHC 31.8 - 35.4 g/dL 34.1  RDW, POC % 14.1  Platelet Count, POC 142 - 424 K/uL 251  MPV 0 - 99.8 fL 8.2  Resulting Agency  [facility]    Specimen Collected: 01/21/16 10:31 Last Resulted: 01/21/16 10:31     Notes Recorded by Darlyne Russian, MD on 01/22/2016 at 8:34 AM EDT Labs stable. Kidney function stable. Muscle enzymes normal.   Ref Range & Units 4d ago  Sed Rate 0 - 30 mm/hr 28  Resulting Agency  [facility]  Narrative  Performed at: [address], [id number]        [place], [place] [address]    Specimen Collected: 01/21/16 10:19 Last Resulted: 01/21/16 14:58                    Ref Range & Units 2wk ago  CRP <0.60 mg/dL 2.1   Resulting Agency  [facility]  Narrative   Performed at: [address], [id number]        [place], [place] [address]    Specimen Collected: 01/10/16 13:34 Last Resulted: 01/10/16 22:08       Assessment:  55 yo woman with debilitating myalgia and arthralgia, fatigue, and recent ARF with high CK levels.  The latter two have resolved, but symptoms have left patient quite distraught.  She did respond to brief courses of prednisone, and I think she deserves a three week course while awaiting consultations with other specialists.  Plan: Follow up with Dr. Everlene Farrier in three weeks Consultations pending Prednisone 10 mg daily for a week, then every other day  for a week, then 1/2 tab for a week.   Valerie Haber, MD

## 2016-01-25 NOTE — Patient Instructions (Addendum)
  Please return in 3 weeks to review your status with Dr. Everlene Farrier.   IF you received an x-ray today, you will receive an invoice from [facility]. Please contact [facility] at [phone] with questions or concerns regarding your invoice.   IF you received labwork today, you will receive an invoice from [employer]. Please contact [facility] at [phone] with questions or concerns regarding your invoice.   Our billing staff will not be able to assist you with questions regarding bills from these companies.  You will be contacted with the lab results as soon as they are available. The fastest way to get your results is to activate your My Chart account. Instructions are located on the last page of this paperwork. If you have not heard from [place] regarding the results in 2 weeks, please contact this office.

## 2016-02-04 ENCOUNTER — Ambulatory Visit (HOSPITAL_COMMUNITY)
Admission: RE | Admit: 2016-02-04 | Discharge: 2016-02-04 | Disposition: A | Discharge status: Home / Self Care | Payer: Medicare Other | Source: Ambulatory Visit | Attending: Vascular Surgery | Admitting: Vascular Surgery

## 2016-02-04 DIAGNOSIS — M79661 Pain in right lower leg: Secondary | ICD-10-CM

## 2016-02-04 DIAGNOSIS — M79662 Pain in left lower leg: Secondary | ICD-10-CM | POA: Insufficient documentation

## 2016-02-18 ENCOUNTER — Ambulatory Visit (INDEPENDENT_AMBULATORY_CARE_PROVIDER_SITE_OTHER): Payer: Medicare Other | Admitting: Emergency Medicine

## 2016-02-18 DIAGNOSIS — F411 Generalized anxiety disorder: Secondary | ICD-10-CM

## 2016-02-18 MED ORDER — PREDNISONE 5 MG PO TABS: ORAL_TABLET | ORAL | 0 refills | Status: DC

## 2016-02-18 MED ORDER — ALPRAZOLAM 1 MG PO TABS: 1.0000 mg | ORAL_TABLET | Freq: Three times a day (TID) | ORAL | 1 refills | Status: DC | PRN

## 2016-02-18 MED ORDER — BENAZEPRIL HCL 40 MG PO TABS: 40.0000 mg | ORAL_TABLET | Freq: Every day | ORAL | 3 refills | Status: DC

## 2016-02-18 NOTE — Progress Notes (Signed)
By signing my name below, I, Mesha Guinyard, attest that this documentation has been prepared under the direction and in the presence of Arlyss Queen, MD.  Electronically Signed: Verlee Monte, Medical Scribe. 02/18/16. 9:32 AM.  Chief Complaint:  Chief Complaint  Patient presents with  . Follow-up    renal failure  . Foot Pain    right 6 days ago    HPI: Valerie [name] is a 55 y.o. female who reports to [facility] today for follow-up. Pt reports just finishing the prednisone Tuesday for the pain from her left knee down, and fatigue that began in March. Pt prednisone taper was:10 mg QD for a week, 10 every other day, 10 mg half a week, and 5 mg every other day. Pt reports feeling bad when when she's not on it, and she's been trying to stay hydrated.  Lab Results  Component Value Date   CREATININE 1.18 (H) 01/21/2016   Rheumatologist: Pt has an appt with [facility] Rheumatologist 9/22  Foot Pain: Pt reports having right heel pain for less than a week. Pt mentions she had previously twisted her left foot. Pt has been wearing a compression stocking on her left foot. 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  Cancer Screening: Pt is scheduled for a colonoscopy Monday (in 3 days) with Dr. Amedeo Plenty  PTSD: Pt is followed by someone by a counselor in [place] and she talks to her minister for her PTSD. Pt states the support doesn't change the frustration from PTSD. Pt takes Xanax PRN and would like to get a refill.  Exercise: Pt mentions she could swim, but she mainly gets her exercise from walking up and down the steps at her church.  Fibromyalgia: Pt hasn't had a deep message for relief to her myalgia symptoms.  Chronic Back Pain: Pt fell on the ice in '96 when she hurt her back and has had chronic back pain since. Pt is followed by Pain Management in [place]  Past Medical History:  Diagnosis Date  . Acute ischemic colitis ([place])   . Anemia 05/01/2012  . Anxiety   . ARF (acute  renal failure) ([place]) 12/28/2015  . Arthritis    "basically ~ q joint" (12/28/2015)  . Asthma   . Basal cell carcinoma of upper back ~ 2011  . Chronic lower back pain   . DDD (degenerative disc disease), lumbar   . Diarrhea    EPISODIC  . Fibromyalgia   . First degree heart block   . Gastro-esophageal reflux   . Headache(784.0)    "muscular w/migraine intensity; ~ 5-6 months" (12/28/2015)  . Hypertension   . Mental disorder   . Nerve pain    nerve damage to right arm  . Neuromuscular disorder ([place])   . Pancreatitis   . Pneumonia ~ 2014; 08/2015  . Polymyalgia rheumatica ([place]) onset 08/2015  . Precancerous melanosis    "2nd toe left foot; 5th toe of right foot; right temple; had them all cut off"  . Sleep apnea    CPAP-9   Past Surgical History:  Procedure Laterality Date  . BASAL CELL CARCINOMA EXCISION  ~ 2011   back  . BOTOX INJECTION N/A 11/06/2012   Procedure: BOTOX INJECTION;  Surgeon: Missy Sabins, MD;  Location: [facility];  Service: Endoscopy;  Laterality: N/A;  . BOTOX INJECTION N/A 12/04/2013   Procedure: BOTOX INJECTION;  Surgeon: Missy Sabins, MD;  Location: [place];  Service: Endoscopy;  Laterality: N/A;  .  BOTOX INJECTION N/A 02/26/2014   Procedure: BOTOX INJECTION;  Surgeon: Missy Sabins, MD;  Location: [place];  Service: Endoscopy;  Laterality: N/A;  . BOTOX INJECTION N/A 07/08/2015   Procedure: BOTOX INJECTION;  Surgeon: Teena Irani, MD;  Location: [facility];  Service: Endoscopy;  Laterality: N/A;  . ESOPHAGOGASTRODUODENOSCOPY  12/21/2011   Procedure: ESOPHAGOGASTRODUODENOSCOPY (EGD);  Surgeon: Missy Sabins, MD;  Location: [facility] ENDOSCOPY;  Service: Endoscopy;  Laterality: N/A;  . ESOPHAGOGASTRODUODENOSCOPY N/A 11/06/2012   Procedure: ESOPHAGOGASTRODUODENOSCOPY (EGD);  Surgeon: Missy Sabins, MD;  Location: [facility];  Service: Endoscopy;  Laterality: N/A;  . ESOPHAGOGASTRODUODENOSCOPY N/A 12/04/2013   Procedure: ESOPHAGOGASTRODUODENOSCOPY (EGD);  Surgeon:  Missy Sabins, MD;  Location: [facility] ENDOSCOPY;  Service: Endoscopy;  Laterality: N/A;  . ESOPHAGOGASTRODUODENOSCOPY N/A 02/26/2014   Procedure: ESOPHAGOGASTRODUODENOSCOPY (EGD);  Surgeon: Missy Sabins, MD;  Location: [facility] ENDOSCOPY;  Service: Endoscopy;  Laterality: N/A;  . ESOPHAGOGASTRODUODENOSCOPY (EGD) WITH PROPOFOL N/A 07/08/2015   Procedure: ESOPHAGOGASTRODUODENOSCOPY (EGD) WITH PROPOFOL;  Surgeon: Teena Irani, MD;  Location: [facility];  Service: Endoscopy;  Laterality: N/A;  . FLEXIBLE SIGMOIDOSCOPY  05/01/2012   Procedure: FLEXIBLE SIGMOIDOSCOPY;  Surgeon: Jeryl Columbia, MD;  Location: [facility];  Service: Endoscopy;  Laterality: N/A;  unprepped  . LAPAROSCOPIC CHOLECYSTECTOMY  01/2002  . NASAL SEPTOPLASTY W/ TURBINOPLASTY  1998  . SHOULDER ARTHROSCOPY WITH ROTATOR CUFF REPAIR AND OPEN BICEPS TENODESIS Bilateral 2010-2013   left-right   Social History   Social History  . Marital status: Single    Spouse name: N/A  . Number of children: 0  . Years of education: College   Occupational History  . disabled    Social History Main Topics  . Smoking status: Never Smoker  . Smokeless tobacco: Never Used  . Alcohol use No  . Drug use: No  . Sexual activity: Not Currently   Other Topics Concern  . None   Social History Narrative   Caffeine 2 cups iced tea daily avg.   Family History  Problem Relation Age of Onset  . Heart disease Mother   . COPD Mother   . Arthritis Mother   . Cancer Mother   . Hypertension Mother   . Arthritis Paternal Grandmother   . Heart disease Paternal Grandmother   . Heart disease Paternal Grandfather    Allergies  Allergen Reactions  . Dilaudid [Hydromorphone Hcl] Other (See Comments)    Headache   . Pregabalin   . Metoclopramide Hcl Other (See Comments)    "crawling inside"  . Neurontin [Gabapentin] Other (See Comments)    Tremors   . Other Other (See Comments)    Avoid all SSRI's and SNRI's- damaged pancreas  . Reglan [Metoclopramide]     Prior to Admission medications   Medication Sig Start Date End Date Taking? Authorizing Provider  albuterol (PROVENTIL HFA;VENTOLIN HFA) 108 (90 BASE) MCG/ACT inhaler Inhale 2 puffs into the lungs as needed for wheezing or shortness of breath. 03/04/15   [name], MD  allopurinol (ZYLOPRIM) 300 MG tablet Take 0.5 tablets (150 mg total) by mouth daily. 11/24/15   [name], MD  ALPRAZolam Duanne Moron) 1 MG tablet Take 1 tablet (1 mg total) by mouth 3 (three) times daily as needed for anxiety. 07/31/15   [name], MD  amLODipine (NORVASC) 2.5 MG tablet Take 1 tablet (2.5 mg total) by mouth daily. 07/17/15   [name], MD  baclofen (LIORESAL) 10 MG tablet TAKE 1 TABLET (10 MG TOTAL) BY MOUTH 3 (THREE TIMES  DAILY) 01/12/16   Darlyne Russian, MD  benazepril (LOTENSIN) 40 MG tablet  12/22/15   Historical Provider, MD  Biotin 5000 MCG CAPS Take 10,000 mcg by mouth daily.     Historical Provider, MD  cetirizine (ZYRTEC) 10 MG tablet Take 10 mg by mouth daily.    Historical Provider, MD  Cholecalciferol (VITAMIN D) 2000 UNITS tablet Take 2,000 Units by mouth daily.    Historical Provider, MD  clotrimazole-betamethasone (LOTRISONE) cream Apply 1 application topically 2 (two) times daily. 11/24/15   [name], MD  dexlansoprazole (DEXILANT) 60 MG capsule Take 60 mg by mouth 2 (two) times daily.    Historical Provider, MD  fexofenadine (ALLEGRA) 180 MG tablet Take 180 mg by mouth daily. Reported on 07/17/2015    Historical Provider, MD  fluticasone (FLONASE) 50 MCG/ACT nasal spray Place 2 sprays into both nostrils daily. 01/12/16   Darlyne Russian, MD  HYDROcodone-acetaminophen (NORCO) 10-325 MG per tablet Take 1-2 tablets by mouth every 6 (six) hours as needed (pain.).    Historical Provider, MD  metoprolol succinate (TOPROL-XL) 100 MG 24 hr tablet Take 1 tablet (100 mg total) by mouth daily. Take with or immediately following a meal. 07/31/15   [name], MD  modafinil (PROVIGIL) 200 MG  tablet Take 0.5 tablets (100 mg total) by mouth 2 (two) times daily. 12/30/15   Carmen Dohmeier, MD  montelukast (SINGULAIR) 10 MG tablet TAKE 1 TABLET (10 MG TOTAL) BY MOUTH AT BEDTIME 11/24/15   [name], MD  morphine (MS CONTIN) 30 MG 12 hr tablet Take 30 mg by mouth every 8 (eight) hours. Not from [facility] 09/23/12   Historical Provider, MD  morphine (MS CONTIN) 60 MG 12 hr tablet  12/15/15   Historical Provider, MD  Multiple Vitamins-Minerals (MULTIVITAMIN PO) Take 1 tablet by mouth daily.    Historical Provider, MD  Olopatadine HCl (PATADAY) 0.2 % SOLN APPLY 1 DROP TO EYE 2 (TWO) TIMES DAILY. 07/05/15   [name], MD  Pancrelipase, Lip-Prot-Amyl, (CREON) 24000 UNITS CPEP Take 48,000 Units by mouth 4 (four) times daily as needed (for pancreatitis).    Historical Provider, MD  predniSONE (DELTASONE) 10 MG tablet Take 1 tablet (10 mg total) by mouth daily with breakfast. After one week, take every other day for a week, then 1/2 tab daily for a week 01/25/16   [name], MD  promethazine (PHENERGAN) 25 MG tablet Take 1 tablet (25 mg total) by mouth every 6 (six) hours as needed for nausea or vomiting. 12/27/14   [name], MD  spironolactone (ALDACTONE) 100 MG tablet Take 1 tablet (100 mg total) by mouth daily. 11/24/15   [name], MD  sucralfate (CARAFATE) 1 GM/10ML suspension Take 10 mLs (1 g total) by mouth 4 (four) times daily -  with meals and at bedtime. 05/01/15   [name], MD     ROS: The patient denies fevers, chills, night sweats, unintentional weight loss, chest pain, palpitations, wheezing, dyspnea on exertion, nausea, vomiting, abdominal pain, dysuria, hematuria, melena, numbness, weakness, or tingling.   All other systems have been reviewed and were otherwise negative with the exception of those mentioned in the HPI and as above.    PHYSICAL EXAM: Vitals:   02/18/16 0906  BP: 110/60  Pulse: 83  Resp: 16  Temp: 98.3 F (36.8 C)   Body mass index is  47.32 kg/m.   General: Alert, no acute distress HEENT:  Normocephalic, atraumatic, oropharynx patent. Eye: Gaylene Brooks Cardiovascular:  Regular  rate and rhythm, no rubs murmurs or gallops.  No Carotid bruits, radial pulse intact. No pedal edema.  Respiratory: Clear to auscultation bilaterally.  No wheezes, rales, or rhonchi.  No cyanosis, no use of accessory musculature Abdominal: No organomegaly, abdomen is soft and non-tender, positive bowel sounds.  No masses. Musculoskeletal: Gait intact. No edema, tenderness Skin: No rashes. Neurologic: Facial musculature symmetric. Psychiatric: Patient acts appropriately throughout our interaction. Lymphatic: No cervical or submandibular lymphadenopathy Meds ordered this encounter  Medications  . ALPRAZolam (XANAX) 1 MG tablet    Sig: Take 1 tablet (1 mg total) by mouth 3 (three) times daily as needed for anxiety.    Dispense:  90 tablet    Refill:  1  . benazepril (LOTENSIN) 40 MG tablet    Sig: Take 1 tablet (40 mg total) by mouth daily.    Dispense:  90 tablet    Refill:  3  . predniSONE (DELTASONE) 5 MG tablet    Sig: Take 1 tablet every other day    Dispense:  14 tablet    Refill:  0     LABS:    EKG/XRAY:   Primary read interpreted by Dr. Everlene Farrier at [facility].   ASSESSMENT/PLAN: Patient is frustrated over her situation. She she does not feel the rheumatologist in [place] listen to her. She is looking forward to her follow-up in [place]. She states she does improve when she takes steroids. She was given a low-dose taper to keep on hand. Her alprazolam was refilled. She goes to pain management in line. She understands alprazolam and her pain medications cannot be taken together.I personally performed the services described in this documentation, which was scribed in my presence. The recorded information has been reviewed and is accurate.   Gross sideeffects, risk and benefits, and alternatives of medications d/w patient. Patient is  aware that all medications have potential sideeffects and we are unable to predict every sideeffect or drug-drug interaction that may occur.  Arlyss Queen MD 02/18/2016 9:32 AM

## 2016-02-18 NOTE — Patient Instructions (Addendum)
[facility] Policy for Prescribing Controlled Substances (Revised 04/2012) 1. Prescriptions for controlled substances will be filled by ONE provider at [facility] with whom you have established and developed a plan for your care, including follow-up. 2. You are encouraged to schedule an appointment with your prescriber at our appointment center for follow-up visits whenever possible. 3. If you request a prescription for the controlled substance while at [facility] for an acute problem (with someone other than your regular prescriber), you MAY be given a ONE-TIME prescription for a 30-day supply of the controlled substance, to allow time for you to return to see your regular prescriber for additional prescriptions.     IF you received an x-ray today, you will receive an invoice from [facility]. Please contact [facility] at [phone] with questions or concerns regarding your invoice.   IF you received labwork today, you will receive an invoice from [facility]. Please contact [facility] at [phone] with questions or concerns regarding your invoice.   Our billing staff will not be able to assist you with questions regarding bills from these companies.  You will be contacted with the lab results as soon as they are available. The fastest way to get your results is to activate your My Chart account. Instructions are located on the last page of this paperwork. If you have not heard from us regarding the results in 2 weeks, please contact this office.      

## 2016-02-21 DIAGNOSIS — K5730 Diverticulosis of large intestine without perforation or abscess without bleeding: Secondary | ICD-10-CM | POA: Diagnosis not present

## 2016-02-21 DIAGNOSIS — Z86010 Personal history of colonic polyps: Secondary | ICD-10-CM | POA: Diagnosis not present

## 2016-02-23 DIAGNOSIS — N183 Chronic kidney disease, stage 3 (moderate): Secondary | ICD-10-CM | POA: Diagnosis not present

## 2016-02-23 DIAGNOSIS — R768 Other specified abnormal immunological findings in serum: Secondary | ICD-10-CM | POA: Diagnosis not present

## 2016-02-23 DIAGNOSIS — Z87828 Personal history of other (healed) physical injury and trauma: Secondary | ICD-10-CM | POA: Diagnosis not present

## 2016-02-29 ENCOUNTER — Encounter: Payer: Self-pay | Admitting: Physician Assistant

## 2016-03-03 DIAGNOSIS — R768 Other specified abnormal immunological findings in serum: Secondary | ICD-10-CM | POA: Diagnosis not present

## 2016-03-03 DIAGNOSIS — M1712 Unilateral primary osteoarthritis, left knee: Secondary | ICD-10-CM | POA: Diagnosis not present

## 2016-03-03 DIAGNOSIS — M7731 Calcaneal spur, right foot: Secondary | ICD-10-CM | POA: Diagnosis not present

## 2016-03-03 DIAGNOSIS — M25561 Pain in right knee: Secondary | ICD-10-CM | POA: Diagnosis not present

## 2016-03-03 DIAGNOSIS — M25562 Pain in left knee: Secondary | ICD-10-CM | POA: Diagnosis not present

## 2016-03-03 DIAGNOSIS — M797 Fibromyalgia: Secondary | ICD-10-CM | POA: Diagnosis not present

## 2016-03-03 DIAGNOSIS — M79604 Pain in right leg: Secondary | ICD-10-CM | POA: Diagnosis not present

## 2016-03-03 DIAGNOSIS — M79605 Pain in left leg: Secondary | ICD-10-CM | POA: Diagnosis not present

## 2016-03-03 DIAGNOSIS — Z888 Allergy status to other drugs, medicaments and biological substances status: Secondary | ICD-10-CM | POA: Diagnosis not present

## 2016-03-03 DIAGNOSIS — Z885 Allergy status to narcotic agent status: Secondary | ICD-10-CM | POA: Diagnosis not present

## 2016-03-10 DIAGNOSIS — R768 Other specified abnormal immunological findings in serum: Secondary | ICD-10-CM | POA: Diagnosis not present

## 2016-03-10 DIAGNOSIS — Z87828 Personal history of other (healed) physical injury and trauma: Secondary | ICD-10-CM | POA: Diagnosis not present

## 2016-03-10 DIAGNOSIS — I10 Essential (primary) hypertension: Secondary | ICD-10-CM | POA: Diagnosis not present

## 2016-03-21 DIAGNOSIS — Z23 Encounter for immunization: Secondary | ICD-10-CM | POA: Diagnosis not present

## 2016-03-21 DIAGNOSIS — D72828 Other elevated white blood cell count: Secondary | ICD-10-CM | POA: Diagnosis not present

## 2016-03-21 DIAGNOSIS — G629 Polyneuropathy, unspecified: Secondary | ICD-10-CM | POA: Diagnosis not present

## 2016-03-29 DIAGNOSIS — M79605 Pain in left leg: Secondary | ICD-10-CM | POA: Diagnosis not present

## 2016-03-29 DIAGNOSIS — M79604 Pain in right leg: Secondary | ICD-10-CM | POA: Diagnosis not present

## 2016-03-30 DIAGNOSIS — R768 Other specified abnormal immunological findings in serum: Secondary | ICD-10-CM | POA: Diagnosis not present

## 2016-04-02 ENCOUNTER — Encounter: Payer: Self-pay | Admitting: Neurology

## 2016-04-03 ENCOUNTER — Ambulatory Visit (INDEPENDENT_AMBULATORY_CARE_PROVIDER_SITE_OTHER): Payer: Medicare Other | Admitting: Adult Health

## 2016-04-03 ENCOUNTER — Encounter: Payer: Self-pay | Admitting: Adult Health

## 2016-04-03 VITALS — BP 108/74 | HR 70 | Resp 24 | Ht 64.0 in | Wt 324.0 lb

## 2016-04-03 DIAGNOSIS — Z9989 Dependence on other enabling machines and devices: Secondary | ICD-10-CM

## 2016-04-03 DIAGNOSIS — G4733 Obstructive sleep apnea (adult) (pediatric): Secondary | ICD-10-CM | POA: Diagnosis not present

## 2016-04-03 NOTE — Progress Notes (Signed)
I agree with the assessment and plan as directed by NP .The patient is known to me .   Valerie Avina, MD  

## 2016-04-03 NOTE — Patient Instructions (Signed)
Continue using CPAP nightly Will change setting to 5-15 If your symptoms worsen or you develop new symptoms please let us know.

## 2016-04-03 NOTE — Progress Notes (Signed)
PATIENT: Valerie [name] DOB: [birth year]  REASON FOR VISIT: follow up- obstructive sleep apnea on CPAP HISTORY FROM: patient  HISTORY OF PRESENT ILLNESS: Valerie [name] is a 55 year old female with a history of obstructive sleep apnea on CPAP. She returns today for a compliance download. Her download indicates that she uses her machine 30 out of 30 days for compliance of 100%. she uses machine greater than 4 hours 28 out of 30 days for compliance of 93%. On average he uses his machine 7 hours and 53 minutes. His residual AHI is 2.1 on a minimum pressure of 8cmH20 and maximum pressure of 15 cmH20 were with EPR 2. She does not have a significant leak. She does report that since her pressure has been changed from 8-15 it has been harder for her to fall asleep. She states that it was more comfortable with a minimum pressure was 5 cm of water. She states that she was diagnosed with an autoimmune- microscopic poly-angitis. She sees a rheumatologist at [facility]. She returns today for an evaluation.  REVIEW OF SYSTEMS: Out of a complete 14 system review of symptoms, the patient complains only of the following symptoms, and all other reviewed systems are negative.  Eye itching, apnea, joint pain, aching muscles, walking difficulty, headache  ALLERGIES: Allergies  Allergen Reactions  . Dilaudid [Hydromorphone Hcl] Other (See Comments)    Headache   . Pregabalin   . Metoclopramide Hcl Other (See Comments)    "crawling inside"  . Neurontin [Gabapentin] Other (See Comments)    Tremors   . Other Other (See Comments)    Avoid all SSRI's and SNRI's- damaged pancreas  . Reglan [Metoclopramide]     HOME MEDICATIONS: Outpatient Medications Prior to Visit  Medication Sig Dispense Refill  . albuterol (PROVENTIL HFA;VENTOLIN HFA) 108 (90 BASE) MCG/ACT inhaler Inhale 2 puffs into the lungs as needed for wheezing or shortness of breath. 1 Inhaler 3  . allopurinol (ZYLOPRIM) 300  MG tablet Take 0.5 tablets (150 mg total) by mouth daily. 90 tablet 1  . ALPRAZolam (XANAX) 1 MG tablet Take 1 tablet (1 mg total) by mouth 3 (three) times daily as needed for anxiety. 90 tablet 1  . baclofen (LIORESAL) 10 MG tablet TAKE 1 TABLET (10 MG TOTAL) BY MOUTH 3 (THREE TIMES DAILY) 90 tablet 2  . benazepril (LOTENSIN) 40 MG tablet Take 1 tablet (40 mg total) by mouth daily. 90 tablet 3  . Biotin 5000 MCG CAPS Take 10,000 mcg by mouth daily.     . cetirizine (ZYRTEC) 10 MG tablet Take 10 mg by mouth daily.    . Cholecalciferol (VITAMIN D) 2000 UNITS tablet Take 2,000 Units by mouth daily.    . clotrimazole-betamethasone (LOTRISONE) cream Apply 1 application topically 2 (two) times daily. 30 g 0  . dexlansoprazole (DEXILANT) 60 MG capsule Take 60 mg by mouth 2 (two) times daily.    . fexofenadine (ALLEGRA) 180 MG tablet Take 180 mg by mouth daily. Reported on 07/17/2015    . fluticasone (FLONASE) 50 MCG/ACT nasal spray Place 2 sprays into both nostrils daily. 48 g 2  . HYDROcodone-acetaminophen (NORCO) 10-325 MG per tablet Take 1-2 tablets by mouth every 6 (six) hours as needed (pain.).    Marland Kitchen metoprolol succinate (TOPROL-XL) 100 MG 24 hr tablet Take 1 tablet (100 mg total) by mouth daily. Take with or immediately following a meal. 90 tablet 3  . modafinil (PROVIGIL) 200 MG tablet Take 0.5 tablets (100  mg total) by mouth 2 (two) times daily. 90 tablet 1  . montelukast (SINGULAIR) 10 MG tablet TAKE 1 TABLET (10 MG TOTAL) BY MOUTH AT BEDTIME 90 tablet 3  . morphine (MS CONTIN) 30 MG 12 hr tablet Take 30 mg by mouth every 8 (eight) hours. Not from [place]    . Multiple Vitamins-Minerals (MULTIVITAMIN PO) Take 1 tablet by mouth daily.    . Olopatadine HCl (PATADAY) 0.2 % SOLN APPLY 1 DROP TO EYE 2 (TWO) TIMES DAILY. 2.5 mL 8  . Pancrelipase, Lip-Prot-Amyl, (CREON) 24000 UNITS CPEP Take 48,000 Units by mouth 4 (four) times daily as needed (for pancreatitis).    . predniSONE (DELTASONE) 5 MG tablet Take  1 tablet every other day 14 tablet 0  . promethazine (PHENERGAN) 25 MG tablet Take 1 tablet (25 mg total) by mouth every 6 (six) hours as needed for nausea or vomiting. 30 tablet 1  . spironolactone (ALDACTONE) 100 MG tablet Take 1 tablet (100 mg total) by mouth daily. 90 tablet 3  . sucralfate (CARAFATE) 1 GM/10ML suspension Take 10 mLs (1 g total) by mouth 4 (four) times daily -  with meals and at bedtime. 420 mL 0  . amLODipine (NORVASC) 2.5 MG tablet Take 1 tablet (2.5 mg total) by mouth daily. (Patient not taking: Reported on 04/03/2016) 30 tablet 3  . morphine (MS CONTIN) 60 MG 12 hr tablet      No facility-administered medications prior to visit.     PAST MEDICAL HISTORY: Past Medical History:  Diagnosis Date  . Acute ischemic colitis ([place])   . Anemia 05/01/2012  . Anxiety   . ARF (acute renal failure) ([place]) 12/28/2015  . Arthritis    "basically ~ q joint" (12/28/2015)  . Asthma   . Basal cell carcinoma of upper back ~ 2011  . Chronic lower back pain   . DDD (degenerative disc disease), lumbar   . Diarrhea    EPISODIC  . Fibromyalgia   . First degree heart block   . Gastro-esophageal reflux   . Headache(784.0)    "muscular w/migraine intensity; ~ 5-6 months" (12/28/2015)  . Hypertension   . Mental disorder   . Nerve pain    nerve damage to right arm  . Neuromuscular disorder ([place])   . Pancreatitis   . Pneumonia ~ 2014; 08/2015  . Polymyalgia rheumatica ([place]) onset 08/2015  . Precancerous melanosis    "2nd toe left foot; 5th toe of right foot; right temple; had them all cut off"  . Sleep apnea    CPAP-9    PAST SURGICAL HISTORY: Past Surgical History:  Procedure Laterality Date  . BASAL CELL CARCINOMA EXCISION  ~ 2011   back  . BOTOX INJECTION N/A 11/06/2012   Procedure: BOTOX INJECTION;  Surgeon: Missy Sabins, MD;  Location: [facility];  Service: Endoscopy;  Laterality: N/A;  . BOTOX INJECTION N/A 12/04/2013   Procedure: BOTOX INJECTION;  Surgeon: Missy Sabins,  MD;  Location: [facility];  Service: Endoscopy;  Laterality: N/A;  . BOTOX INJECTION N/A 02/26/2014   Procedure: BOTOX INJECTION;  Surgeon: Missy Sabins, MD;  Location: [facility];  Service: Endoscopy;  Laterality: N/A;  . BOTOX INJECTION N/A 07/08/2015   Procedure: BOTOX INJECTION;  Surgeon: Teena Irani, MD;  [facility];  Service: Endoscopy;  Laterality: N/A;  . ESOPHAGOGASTRODUODENOSCOPY  12/21/2011   Procedure: ESOPHAGOGASTRODUODENOSCOPY (EGD);  Surgeon: Missy Sabins, MD;  Location: [facility] ENDOSCOPY;  Service: Endoscopy;  Laterality: N/A;  . ESOPHAGOGASTRODUODENOSCOPY  N/A 11/06/2012   Procedure: ESOPHAGOGASTRODUODENOSCOPY (EGD);  Surgeon: Missy Sabins, MD;  Location: [facility];  Service: Endoscopy;  Laterality: N/A;  . ESOPHAGOGASTRODUODENOSCOPY N/A 12/04/2013   Procedure: ESOPHAGOGASTRODUODENOSCOPY (EGD);  Surgeon: Missy Sabins, MD;  Location[facility]COPY;  Service: Endoscopy;  Laterality: N/A;  . ESOPHAGOGASTRODUODENOSCOPY N/A 02/26/2014   Procedure: ESOPHAGOGASTRODUODENOSCOPY (EGD);  Surgeon: Missy Sabins, MD;  Location[facility] ENDOSCOPY;  Service: Endoscopy;  Laterality: N/A;  . ESOPHAGOGASTRODUODENOSCOPY (EGD) WITH PROPOFOL N/A 07/08/2015   Procedure: ESOPHAGOGASTRODUODENOSCOPY (EGD) WITH PROPOFOL;  Surgeon: Teena Irani, MD;  [facility];  Service: Endoscopy;  Laterality: N/A;  . FLEXIBLE SIGMOIDOSCOPY  05/01/2012   Procedure: FLEXIBLE SIGMOIDOSCOPY;  Surgeon: Jeryl Columbia, MD;  [facility];  Service: Endoscopy;  Laterality: N/A;  unprepped  . LAPAROSCOPIC CHOLECYSTECTOMY  01/2002  . NASAL SEPTOPLASTY W/ TURBINOPLASTY  1998  . SHOULDER ARTHROSCOPY WITH ROTATOR CUFF REPAIR AND OPEN BICEPS TENODESIS Bilateral 2010-2013   left-right    FAMILY HISTORY: Family History  Problem Relation Age of Onset  . Heart disease Mother   . COPD Mother   . Arthritis Mother   . Cancer Mother   . Hypertension Mother   . Arthritis Paternal Grandmother   . Heart disease  Paternal Grandmother   . Heart disease Paternal Grandfather     SOCIAL HISTORY: Social History   Social History  . Marital status: Single    Spouse name: N/A  . Number of children: 0  . Years of education: College   Occupational History  . disabled    Social History Main Topics  . Smoking status: Never Smoker  . Smokeless tobacco: Never Used  . Alcohol use No  . Drug use: No  . Sexual activity: Not Currently   Other Topics Concern  . Not on file   Social History Narrative   Caffeine 2 cups iced tea daily avg.      PHYSICAL EXAM  Vitals:   04/03/16 1015  BP: 108/74  Pulse: 70  Resp: (!) 24  Weight: (!) 324 lb (147 kg)  Height: 5\' 4"  (1.626 m)   Body mass index is 55.61 kg/m.  Generalized: Well developed, in no acute distress   Neurological examination  Mentation: Alert oriented to time, place, history taking. Follows all commands speech and language fluent Cranial nerve II-XII: Pupils were equal round reactive to light. Extraocular movements were full, visual field were full on confrontational test. Facial sensation and strength were normal. Uvula tongue midline. Head turning and shoulder shrug  were normal and symmetric. Motor: The motor testing reveals 5 over 5 strength of all 4 extremities. Good symmetric motor tone is noted throughout.  Sensory: Sensory testing is intact to soft touch on all 4 extremities. No evidence of extinction is noted.  Coordination: Cerebellar testing reveals good finger-nose-finger and heel-to-shin bilaterally.  Gait and station: Gait is normal. Tandem gait is normal. Romberg is negative. No drift is seen.  Reflexes: Deep tendon reflexes are symmetric and normal bilaterally.   DIAGNOSTIC DATA (LABS, IMAGING, TESTING) - I reviewed patient records, labs, notes, testing and imaging myself where available.  Lab Results  Component Value Date   WBC 6.7 01/21/2016   HGB 13.4 01/21/2016   HCT 39.2 01/21/2016   MCV 88.6 01/21/2016    PLT 323 01/10/2016      Component Value Date/Time   NA 139 01/21/2016 1018   NA 141 12/30/2015 1051   K 4.8 01/21/2016 1018   CL 102 01/21/2016 1018  CO2 28 01/21/2016 1018   GLUCOSE 92 01/21/2016 1018   BUN 18 01/21/2016 1018   BUN 26 (H) 12/30/2015 1051   CREATININE 1.18 (H) 01/21/2016 1018   CALCIUM 10.3 01/21/2016 1018   PROT 7.0 01/10/2016 1334   ALBUMIN 4.4 01/10/2016 1334   AST 12 01/10/2016 1334   ALT 11 01/10/2016 1334   ALKPHOS 44 01/10/2016 1334   BILITOT 0.3 01/10/2016 1334   GFRNONAA 53 (L) 01/21/2016 1018   GFRAA 61 01/21/2016 1018       ASSESSMENT AND PLAN 54 y.o. year old female  has a past medical history of Acute ischemic colitis ([place]); Anemia (05/01/2012); Anxiety; ARF (acute renal failure) ([place]) (12/28/2015); Arthritis; Asthma; Basal cell carcinoma of upper back (~ 2011); Chronic lower back pain; DDD (degenerative disc disease), lumbar; Diarrhea; Fibromyalgia; First degree heart block; Gastro-esophageal reflux; Headache(784.0); Hypertension; Mental disorder; Nerve pain; Neuromuscular disorder ([place]); Pancreatitis; Pneumonia (~ 2014; 08/2015); Polymyalgia rheumatica ([place]) (onset 08/2015); Precancerous melanosis; and Sleep apnea. here with:  1. Obstructive sleep apnea on CPAP  The patient's download shows excellent compliance and treatment of her apnea. I will adjust the patient settings to 5-15cm of water as she was more comfortable with this. Patient advised that if her symptoms worsen or she develops any new symptoms she should let us know. Follow-up in 6 months or sooner if needed.      Ward Givens, MSN, NP-C 04/03/2016, 1:15 PM [facility] [address], [place], [place] [address] [phone]

## 2016-04-04 DIAGNOSIS — F4310 Post-traumatic stress disorder, unspecified: Secondary | ICD-10-CM | POA: Diagnosis not present

## 2016-04-04 DIAGNOSIS — M79662 Pain in left lower leg: Secondary | ICD-10-CM | POA: Diagnosis not present

## 2016-04-04 DIAGNOSIS — M545 Low back pain: Secondary | ICD-10-CM | POA: Diagnosis not present

## 2016-04-04 DIAGNOSIS — E669 Obesity, unspecified: Secondary | ICD-10-CM | POA: Diagnosis not present

## 2016-04-04 DIAGNOSIS — M461 Sacroiliitis, not elsewhere classified: Secondary | ICD-10-CM | POA: Diagnosis not present

## 2016-04-04 DIAGNOSIS — M79621 Pain in right upper arm: Secondary | ICD-10-CM | POA: Diagnosis not present

## 2016-04-04 DIAGNOSIS — M79661 Pain in right lower leg: Secondary | ICD-10-CM | POA: Diagnosis not present

## 2016-04-04 DIAGNOSIS — G4733 Obstructive sleep apnea (adult) (pediatric): Secondary | ICD-10-CM | POA: Diagnosis not present

## 2016-04-04 DIAGNOSIS — F332 Major depressive disorder, recurrent severe without psychotic features: Secondary | ICD-10-CM | POA: Diagnosis not present

## 2016-04-04 DIAGNOSIS — Z7952 Long term (current) use of systemic steroids: Secondary | ICD-10-CM | POA: Diagnosis not present

## 2016-04-04 DIAGNOSIS — Z79891 Long term (current) use of opiate analgesic: Secondary | ICD-10-CM | POA: Diagnosis not present

## 2016-04-04 DIAGNOSIS — Z9989 Dependence on other enabling machines and devices: Secondary | ICD-10-CM | POA: Diagnosis not present

## 2016-04-06 DIAGNOSIS — M542 Cervicalgia: Secondary | ICD-10-CM | POA: Diagnosis not present

## 2016-04-10 DIAGNOSIS — M542 Cervicalgia: Secondary | ICD-10-CM | POA: Diagnosis not present

## 2016-04-13 ENCOUNTER — Ambulatory Visit (INDEPENDENT_AMBULATORY_CARE_PROVIDER_SITE_OTHER): Payer: Medicare Other | Admitting: Family Medicine

## 2016-04-13 ENCOUNTER — Encounter: Payer: Self-pay | Admitting: Family Medicine

## 2016-04-13 VITALS — BP 104/84 | HR 71 | Temp 97.7°F | Resp 16 | Ht 65.0 in | Wt 284.2 lb

## 2016-04-13 DIAGNOSIS — N10 Acute tubulo-interstitial nephritis: Secondary | ICD-10-CM

## 2016-04-13 DIAGNOSIS — R109 Unspecified abdominal pain: Secondary | ICD-10-CM | POA: Diagnosis not present

## 2016-04-13 DIAGNOSIS — R300 Dysuria: Secondary | ICD-10-CM

## 2016-04-13 DIAGNOSIS — Z79899 Other long term (current) drug therapy: Secondary | ICD-10-CM | POA: Diagnosis not present

## 2016-04-13 LAB — POCT URINALYSIS DIP (MANUAL ENTRY)
BILIRUBIN UA: NEGATIVE
BILIRUBIN UA: NEGATIVE
Glucose, UA: NEGATIVE
Nitrite, UA: NEGATIVE
PH UA: 5.5
PROTEIN UA: NEGATIVE
SPEC GRAV UA: 1.015
Urobilinogen, UA: 0.2

## 2016-04-13 LAB — POCT CBC
GRANULOCYTE PERCENT: 64.9 % (ref 37–80)
HCT, POC: 41.2 % (ref 37.7–47.9)
Hemoglobin: 14.1 g/dL (ref 12.2–16.2)
LYMPH, POC: 3.3 (ref 0.6–3.4)
MCH, POC: 30.3 pg (ref 27–31.2)
MCHC: 34.2 g/dL (ref 31.8–35.4)
MCV: 88.6 fL (ref 80–97)
MID (CBC): 0.4 (ref 0–0.9)
MPV: 7.9 fL (ref 0–99.8)
PLATELET COUNT, POC: 280 10*3/uL (ref 142–424)
POC Granulocyte: 6.9 (ref 2–6.9)
POC LYMPH %: 31.3 % (ref 10–50)
POC MID %: 3.8 %M (ref 0–12)
RBC: 4.64 M/uL (ref 4.04–5.48)
RDW, POC: 14.4 %
WBC: 10.6 10*3/uL — AB (ref 4.6–10.2)

## 2016-04-13 MED ORDER — CEFTRIAXONE SODIUM 1 G IJ SOLR
1.0000 g | Freq: Once | INTRAMUSCULAR | Status: AC
  Administered 2016-04-13: 1 g via INTRAMUSCULAR

## 2016-04-13 NOTE — Progress Notes (Deleted)
1 week of right flank pain and then over the past several days noted a metallic taste in her mouth which she only had once prior when she had pyelonephrotisi. No h/o kidney stones.  No h/o kidney problems until this past year in July. Mild nausea, no vomiting.  Weird tongue sensation. A little more tired.  + dysuria, urinary frequency. Is drinking a lot but no overding. Urgency and hesitancy.  Interstitial cystitis, bowels regular  Has gastroparesis of unknown etiology starting in 2004 and then recurrent in 2012 and steady since3.  Sees Dr. Amedeo Plenty at [facility] tolerate Reglan, and so done botox inj over the last 3 years annually.  Had a mild pancreatitis flair in July but otherwise not   Has an "autoimmune" thing that started in March - is seeing nephrologist - Dr. Pearson Grippe and seeing Dr. Mertie Clause at [facility] as debating what is going on as she has had multiple conflicting diagnosis.  Past Medical History:  Diagnosis Date  . Acute ischemic colitis ([place])   . Anemia 05/01/2012  . Anxiety   . ARF (acute renal failure) ([place]) 12/28/2015  . Arthritis    "basically ~ q joint" (12/28/2015)  . Asthma   . Basal cell carcinoma of upper back ~ 2011  . Chronic lower back pain   . DDD (degenerative disc disease), lumbar   . Diarrhea    EPISODIC  . Fibromyalgia   . First degree heart block   . Gastro-esophageal reflux   . Headache(784.0)    "muscular w/migraine intensity; ~ 5-6 months" (12/28/2015)  . Hypertension   . Mental disorder   . Nerve pain    nerve damage to right arm  . Neuromuscular disorder ([place])   . Pancreatitis   . Pneumonia ~ 2014; 08/2015  . Polymyalgia rheumatica ([place]) onset 08/2015  . Precancerous melanosis    "2nd toe left foot; 5th toe of right foot; right temple; had them all cut off"  . Sleep apnea    CPAP-9   Past Surgical History:  Procedure Laterality Date  . BASAL CELL CARCINOMA EXCISION  ~ 2011   back  . BOTOX INJECTION N/A 11/06/2012   Procedure:  BOTOX INJECTION;  Surgeon: Missy Sabins, MD;  [facility];  Service: Endoscopy;  Laterality: N/A;  . BOTOX INJECTION N/A 12/04/2013   Procedure: BOTOX INJECTION;  Surgeon: Missy Sabins, MD;  Location: [facility];  Service: Endoscopy;  Laterality: N/A;  . BOTOX INJECTION N/A 02/26/2014   Procedure: BOTOX INJECTION;  Surgeon: Missy Sabins, MD;  Location: [facility];  Service: Endoscopy;  Laterality: N/A;  . BOTOX INJECTION N/A 07/08/2015   Procedure: BOTOX INJECTION;  Surgeon: Teena Irani, MD;  [facility];  Service: Endoscopy;  Laterality: N/A;  . ESOPHAGOGASTRODUODENOSCOPY  12/21/2011   Procedure: ESOPHAGOGASTRODUODENOSCOPY (EGD);  Surgeon: Missy Sabins, MD;  Location: [facility] ENDOSCOPY;  Service: Endoscopy;  Laterality: N/A;  . ESOPHAGOGASTRODUODENOSCOPY N/A 11/06/2012   Procedure: ESOPHAGOGASTRODUODENOSCOPY (EGD);  Surgeon: Missy Sabins, MD;  Location: [facility];  Service: Endoscopy;  Laterality: N/A;  . ESOPHAGOGASTRODUODENOSCOPY N/A 12/04/2013   Procedure: ESOPHAGOGASTRODUODENOSCOPY (EGD);  Surgeon: Missy Sabins, MD;  Location: [facility] ENDOSCOPY;  Service: Endoscopy;  Laterality: N/A;  . ESOPHAGOGASTRODUODENOSCOPY N/A 02/26/2014   Procedure: ESOPHAGOGASTRODUODENOSCOPY (EGD);  Surgeon: Missy Sabins, MD;  Location: [facility] ENDOSCOPY;  Service: Endoscopy;  Laterality: N/A;  . ESOPHAGOGASTRODUODENOSCOPY (EGD) WITH PROPOFOL N/A 07/08/2015   Procedure: ESOPHAGOGASTRODUODENOSCOPY (EGD) WITH PROPOFOL;  Surgeon: Teena Irani, MD;  [facility];  Service: Endoscopy;  Laterality: N/A;  . FLEXIBLE SIGMOIDOSCOPY  05/01/2012   Procedure: FLEXIBLE SIGMOIDOSCOPY;  Surgeon: Jeryl Columbia, MD;  [facility];  Service: Endoscopy;  Laterality: N/A;  unprepped  . LAPAROSCOPIC CHOLECYSTECTOMY  01/2002  . NASAL SEPTOPLASTY W/ TURBINOPLASTY  1998  . SHOULDER ARTHROSCOPY WITH ROTATOR CUFF REPAIR AND OPEN BICEPS TENODESIS Bilateral 2010-2013   left-right   Current Outpatient Prescriptions on  File Prior to Visit  Medication Sig Dispense Refill  . albuterol (PROVENTIL HFA;VENTOLIN HFA) 108 (90 BASE) MCG/ACT inhaler Inhale 2 puffs into the lungs as needed for wheezing or shortness of breath. 1 Inhaler 3  . allopurinol (ZYLOPRIM) 300 MG tablet Take 0.5 tablets (150 mg total) by mouth daily. 90 tablet 1  . ALPRAZolam (XANAX) 1 MG tablet Take 1 tablet (1 mg total) by mouth 3 (three) times daily as needed for anxiety. 90 tablet 1  . baclofen (LIORESAL) 10 MG tablet TAKE 1 TABLET (10 MG TOTAL) BY MOUTH 3 (THREE TIMES DAILY) 90 tablet 2  . benazepril (LOTENSIN) 40 MG tablet Take 1 tablet (40 mg total) by mouth daily. 90 tablet 3  . Biotin 5000 MCG CAPS Take 10,000 mcg by mouth daily.     . cetirizine (ZYRTEC) 10 MG tablet Take 10 mg by mouth daily.    . Cholecalciferol (VITAMIN D) 2000 UNITS tablet Take 2,000 Units by mouth daily.    . clotrimazole-betamethasone (LOTRISONE) cream Apply 1 application topically 2 (two) times daily. 30 g 0  . dexlansoprazole (DEXILANT) 60 MG capsule Take 60 mg by mouth 2 (two) times daily.    . fexofenadine (ALLEGRA) 180 MG tablet Take 180 mg by mouth daily. Reported on 07/17/2015    . fluticasone (FLONASE) 50 MCG/ACT nasal spray Place 2 sprays into both nostrils daily. 48 g 2  . HYDROcodone-acetaminophen (NORCO) 10-325 MG per tablet Take 1-2 tablets by mouth every 6 (six) hours as needed (pain.).    Marland Kitchen metoprolol succinate (TOPROL-XL) 100 MG 24 hr tablet Take 1 tablet (100 mg total) by mouth daily. Take with or immediately following a meal. 90 tablet 3  . modafinil (PROVIGIL) 200 MG tablet Take 0.5 tablets (100 mg total) by mouth 2 (two) times daily. 90 tablet 1  . montelukast (SINGULAIR) 10 MG tablet TAKE 1 TABLET (10 MG TOTAL) BY MOUTH AT BEDTIME 90 tablet 3  . Olopatadine HCl (PATADAY) 0.2 % SOLN APPLY 1 DROP TO EYE 2 (TWO) TIMES DAILY. 2.5 mL 8  . Pancrelipase, Lip-Prot-Amyl, (CREON) 24000 UNITS CPEP Take 48,000 Units by mouth 4 (four) times daily as needed  (for pancreatitis).    . predniSONE (DELTASONE) 5 MG tablet Take 1 tablet every other day 14 tablet 0  . promethazine (PHENERGAN) 25 MG tablet Take 1 tablet (25 mg total) by mouth every 6 (six) hours as needed for nausea or vomiting. 30 tablet 1  . spironolactone (ALDACTONE) 100 MG tablet Take 1 tablet (100 mg total) by mouth daily. 90 tablet 3  . sucralfate (CARAFATE) 1 GM/10ML suspension Take 10 mLs (1 g total) by mouth 4 (four) times daily -  with meals and at bedtime. 420 mL 0  . amLODipine (NORVASC) 2.5 MG tablet Take 1 tablet (2.5 mg total) by mouth daily. (Patient not taking: Reported on 04/13/2016) 30 tablet 3  . morphine (MS CONTIN) 30 MG 12 hr tablet Take 30 mg by mouth every 8 (eight) hours. Not from [place]    . morphine (MS CONTIN) 60 MG  12 hr tablet     . Multiple Vitamins-Minerals (MULTIVITAMIN PO) Take 1 tablet by mouth daily.     No current facility-administered medications on file prior to visit.    Allergies  Allergen Reactions  . Dilaudid [Hydromorphone Hcl] Other (See Comments)    Headache   . Pregabalin   . Metoclopramide Hcl Other (See Comments)    "crawling inside"  . Neurontin [Gabapentin] Other (See Comments)    Tremors   . Other Other (See Comments)    Avoid all SSRI's and SNRI's- damaged pancreas  . Reglan [Metoclopramide]    Family History  Problem Relation Age of Onset  . Heart disease Mother   . COPD Mother   . Arthritis Mother   . Cancer Mother   . Hypertension Mother   . Arthritis Paternal Grandmother   . Heart disease Paternal Grandmother   . Heart disease Paternal Grandfather    Social History   Social History  . Marital status: Single    Spouse name: N/A  . Number of children: 0  . Years of education: College   Occupational History  . disabled    Social History Main Topics  . Smoking status: Never Smoker  . Smokeless tobacco: Never Used  . Alcohol use No  . Drug use: No  . Sexual activity: Not Currently   Other Topics Concern  .  None   Social History Narrative   Caffeine 2 cups iced tea daily avg.    Results for orders placed or performed in visit on 04/13/16  Comprehensive metabolic panel  Result Value Ref Range   Sodium 138 135 - 146 mmol/L   Potassium 4.7 3.5 - 5.3 mmol/L   Chloride 102 98 - 110 mmol/L   CO2 24 20 - 31 mmol/L   Glucose, Bld 89 65 - 99 mg/dL   BUN 26 (H) 7 - 25 mg/dL   Creat 1.24 (H) 0.50 - 1.05 mg/dL   Total Bilirubin 0.2 0.2 - 1.2 mg/dL   Alkaline Phosphatase 55 33 - 130 U/L   AST 13 10 - 35 U/L   ALT 13 6 - 29 U/L   Total Protein 8.1 6.1 - 8.1 g/dL   Albumin 4.7 3.6 - 5.1 g/dL   Calcium 10.3 8.6 - 10.4 mg/dL  POCT urinalysis dipstick  Result Value Ref Range   Color, UA yellow yellow   Clarity, UA cloudy (A) clear   Glucose, UA negative negative   Bilirubin, UA negative negative   Ketones, POC UA negative negative   Spec Grav, UA 1.015    Blood, UA trace-lysed (A) negative   pH, UA 5.5    Protein Ur, POC negative negative   Urobilinogen, UA 0.2    Nitrite, UA Negative Negative   Leukocytes, UA large (3+) (A) Negative  POCT CBC  Result Value Ref Range   WBC 10.6 (A) 4.6 - 10.2 K/uL   Lymph, poc 3.3 0.6 - 3.4   POC LYMPH PERCENT 31.3 10 - 50 %L   MID (cbc) 0.4 0 - 0.9   POC MID % 3.8 0 - 12 %M   POC Granulocyte 6.9 2 - 6.9   Granulocyte percent 64.9 37 - 80 %G   RBC 4.64 4.04 - 5.48 M/uL   Hemoglobin 14.1 12.2 - 16.2 g/dL   HCT, POC 41.2 37.7 - 47.9 %   MCV 88.6 80 - 97 fL   MCH, POC 30.3 27 - 31.2 pg   MCHC 34.2 31.8 - 35.4 g/dL  RDW, POC 14.4 %   Platelet Count, POC 280 142 - 424 K/uL   MPV 7.9 0 - 99.8 fL     1. Dysuria   2. Acute pyelonephritis     Orders Placed This Encounter  Procedures  . Urine culture  . Lipase  . Comprehensive metabolic panel  . POCT urinalysis dipstick  . POCT CBC    Meds ordered this encounter  Medications  . cefTRIAXone (ROCEPHIN) injection 1 g    Delman Cheadle, M.D.  [facility] [address] [place], [place] [address] [phone] phone [phone] fax  04/14/16 11:38 AM

## 2016-04-13 NOTE — Patient Instructions (Addendum)
Stop your benazepril and your spironolactone.  We will touch base tomorrow (unless you are in renal failure again in which case I will call you to go to the ER).  I am hopeful that if needed we may be able to do antibiotic and IV fluids in the office so there is a good chance that I will want you to come back into the clinic tomorrow morning for recheck.  TENTATIVELY PLAN TO COME BACK TO [place] 10:30 Friday 11/3    IF you received an x-ray today, you will receive an invoice from [facility]. Please contact [facility] at [phone] with questions or concerns regarding your invoice.   IF you received labwork today, you will receive an invoice from [employer]. Please contact [facility] at [phone] with questions or concerns regarding your invoice.   Our billing staff will not be able to assist you with questions regarding bills from these companies.  You will be contacted with the lab results as soon as they are available. The fastest way to get your results is to activate your My Chart account. Instructions are located on the last page of this paperwork. If you have not heard from [place] regarding the results in 2 weeks, please contact this office.    Pyelonephritis, Adult Pyelonephritis is a kidney infection. The kidneys are the organs that filter a person's blood and move waste out of the bloodstream and into the urine. Urine passes from the kidneys, through the ureters, and into the bladder. There are two main types of pyelonephritis:  Infections that come on quickly without any warning (acute pyelonephritis).  Infections that last for a long period of time (chronic pyelonephritis). In most cases, the infection clears up with treatment and does not cause further problems. More severe infections or chronic infections can sometimes spread to the bloodstream or lead to other problems with the kidneys. CAUSES This condition is usually  caused by:  Bacteria traveling from the bladder to the kidney through infected urine. The urine in the bladder can become infected with bacteria from:  Bladder infection (cystitis).  Inflammation of the prostate gland (prostatitis).  Sexual intercourse, in females.  Bacteria traveling from the bloodstream to the kidney. RISK FACTORS This condition is more likely to develop in:  Pregnant women.  Older people.  People who have diabetes.  People who have kidney stones or bladder stones.  People who have other abnormalities of the kidney or ureter.  People who have a catheter placed in the bladder.  People who have cancer.  People who are sexually active.  Women who use spermicides.  People who have had a prior urinary tract infection. SYMPTOMS Symptoms of this condition include:  Frequent urination.  Strong or persistent urge to urinate.  Burning or stinging when urinating.  Abdominal pain.  Back pain.  Pain in the side or flank area.  Fever.  Chills.  Blood in the urine, or dark urine.  Nausea.  Vomiting. DIAGNOSIS This condition may be diagnosed based on:  Medical history and physical exam.  Urine tests.  Blood tests. You may also have imaging tests of the kidneys, such as an ultrasound or CT scan. TREATMENT Treatment for this condition may depend on the severity of the infection.  If the infection is mild and is found early, you may be treated with antibiotic medicines taken by mouth. You will need to drink fluids to remain hydrated.  If the infection is more severe, you may need  to stay in the hospital and receive antibiotics given directly into a vein through an IV tube. You may also need to receive fluids through an IV tube if you are not able to remain hydrated. After your hospital stay, you may need to take oral antibiotics for a period of time. Other treatments may be required, depending on the cause of the infection. HOME CARE  INSTRUCTIONS Medicines  Take over-the-counter and prescription medicines only as told by your health care provider.  If you were prescribed an antibiotic medicine, take it as told by your health care provider. Do not stop taking the antibiotic even if you start to feel better. General Instructions  Drink enough fluid to keep your urine clear or pale yellow.  Avoid caffeine, tea, and carbonated beverages. They tend to irritate the bladder.  Urinate often. Avoid holding in urine for long periods of time.  Urinate before and after sex.  After a bowel movement, women should cleanse from front to back. Use each tissue only once.  Keep all follow-up visits as told by your health care provider. This is important. SEEK MEDICAL CARE IF:  Your symptoms do not get better after 2 days of treatment.  Your symptoms get worse.  You have a fever. SEEK IMMEDIATE MEDICAL CARE IF:  You are unable to take your antibiotics or fluids.  You have shaking chills.  You vomit.  You have severe flank or back pain.  You have extreme weakness or fainting.   This information is not intended to replace advice given to you by your health care provider. Make sure you discuss any questions you have with your health care provider.   Document Released: 05/29/2005 Document Revised: 02/17/2015 Document Reviewed: 09/21/2014 Elsevier Interactive Patient Education 2016 Elsevier Inc.  Acute Kidney Injury Acute kidney injury is any condition in which there is sudden (acute) damage to the kidneys. Acute kidney injury was previously known as acute kidney failure or acute renal failure. The kidneys are two organs that lie on either side of the spine between the middle of the back and the front of the abdomen. The kidneys:  Remove wastes and extra water from the blood.   Produce important hormones. These help keep bones strong, regulate blood pressure, and help create red blood cells.   Balance the fluids and  chemicals in the blood and tissues. A small amount of kidney damage may not cause problems, but a large amount of damage may make it difficult or impossible for the kidneys to work the way they should. Acute kidney injury may develop into long-lasting (chronic) kidney disease. It may also develop into a life-threatening disease called end-stage kidney disease. Acute kidney injury can get worse very quickly, so it should be treated right away. Early treatment may prevent other kidney diseases from developing. CAUSES   A problem with blood flow to the kidneys. This may be caused by:   Blood loss.   Heart disease.   Severe burns.   Liver disease.  Direct damage to the kidneys. This may be caused by:  Some medicines.   A kidney infection.   Poisoning or consuming toxic substances.   A surgical wound.   A blow to the kidney area.   A problem with urine flow. This may be caused by:   Cancer.   Kidney stones.   An enlarged prostate. SIGNS AND SYMPTOMS   Swelling (edema) of the legs, ankles, or feet.   Tiredness (lethargy).   Nausea or vomiting.  Confusion.   Problems with urination, such as:   Painful or burning feeling during urination.   Decreased urine production.   Frequent accidents in children who are potty trained.   Bloody urine.   Muscle twitches and cramps.   Shortness of breath.   Seizures.   Chest pain or pressure. Sometimes, no symptoms are present. DIAGNOSIS Acute kidney injury may be detected and diagnosed by tests, including blood, urine, imaging, or kidney biopsy tests.  TREATMENT Treatment of acute kidney injury varies depending on the cause and severity of the kidney damage. In mild cases, no treatment may be needed. The kidneys may heal on their own. If acute kidney injury is more severe, your health care provider will treat the cause of the kidney damage, help the kidneys heal, and prevent complications from  occurring. Severe cases may require a procedure to remove toxic wastes from the body (dialysis) or surgery to repair kidney damage. Surgery may involve:   Repair of a torn kidney.   Removal of an obstruction. HOME CARE INSTRUCTIONS  Follow your prescribed diet.  Take medicines only as directed by your health care provider.  Do not take any new medicines (prescription, over-the-counter, or nutritional supplements) unless approved by your health care provider. Many medicines can worsen your kidney damage or may need to have the dose adjusted.   Keep all follow-up visits as directed by your health care provider. This is important.  Observe your condition to make sure you are healing as expected. SEEK IMMEDIATE MEDICAL CARE IF:  You are feeling ill or have severe pain in the back or side.   Your symptoms return or you have new symptoms.  You have any symptoms of end-stage kidney disease. These include:   Persistent itchiness.   Loss of appetite.   Headaches.   Abnormally dark or light skin.  Numbness in the hands or feet.   Easy bruising.   Frequent hiccups.   Menstruation stops.   You have a fever.  You have increased urine production.  You have pain or bleeding when urinating. MAKE SURE YOU:   Understand these instructions.  Will watch your condition.  Will get help right away if you are not doing well or get worse.   This information is not intended to replace advice given to you by your health care provider. Make sure you discuss any questions you have with your health care provider.   Document Released: 12/12/2010 Document Revised: 06/19/2014 Document Reviewed: 01/26/2012 Elsevier Interactive Patient Education [employer].

## 2016-04-14 ENCOUNTER — Ambulatory Visit (INDEPENDENT_AMBULATORY_CARE_PROVIDER_SITE_OTHER): Payer: Medicare Other | Admitting: Family Medicine

## 2016-04-14 VITALS — BP 124/72 | HR 71 | Temp 98.7°F | Resp 18 | Ht 65.0 in | Wt 286.4 lb

## 2016-04-14 DIAGNOSIS — N10 Acute tubulo-interstitial nephritis: Secondary | ICD-10-CM | POA: Diagnosis not present

## 2016-04-14 DIAGNOSIS — N182 Chronic kidney disease, stage 2 (mild): Secondary | ICD-10-CM

## 2016-04-14 DIAGNOSIS — N179 Acute kidney failure, unspecified: Secondary | ICD-10-CM | POA: Diagnosis not present

## 2016-04-14 LAB — LIPASE: Lipase: 49 U/L (ref 7–60)

## 2016-04-14 LAB — COMPREHENSIVE METABOLIC PANEL
ALK PHOS: 55 U/L (ref 33–130)
ALT: 13 U/L (ref 6–29)
AST: 13 U/L (ref 10–35)
Albumin: 4.7 g/dL (ref 3.6–5.1)
BILIRUBIN TOTAL: 0.2 mg/dL (ref 0.2–1.2)
BUN: 26 mg/dL — AB (ref 7–25)
CALCIUM: 10.3 mg/dL (ref 8.6–10.4)
CO2: 24 mmol/L (ref 20–31)
CREATININE: 1.24 mg/dL — AB (ref 0.50–1.05)
Chloride: 102 mmol/L (ref 98–110)
GLUCOSE: 89 mg/dL (ref 65–99)
Potassium: 4.7 mmol/L (ref 3.5–5.3)
SODIUM: 138 mmol/L (ref 135–146)
Total Protein: 8.1 g/dL (ref 6.1–8.1)

## 2016-04-14 MED ORDER — CEFTRIAXONE SODIUM 1 G IJ SOLR
1.0000 g | Freq: Once | INTRAMUSCULAR | Status: AC
  Administered 2016-04-14: 1 g via INTRAMUSCULAR

## 2016-04-14 NOTE — Progress Notes (Signed)
IV site to right antecubital leaking with infiltration. Removed without diff. Denies pain ,no redness noted.

## 2016-04-14 NOTE — Patient Instructions (Addendum)
IF you received an x-ray today, you will receive an invoice from [facility]. Please contact [facility] at [phone] with questions or concerns regarding your invoice.   IF you received labwork today, you will receive an invoice from [employer]. Please contact [facility] at [phone] with questions or concerns regarding your invoice.   Our billing staff will not be able to assist you with questions regarding bills from these companies.  You will be contacted with the lab results as soon as they are available. The fastest way to get your results is to activate your My Chart account. Instructions are located on the last page of this paperwork. If you have not heard from [place] regarding the results in 2 weeks, please contact this office.      Pyelonephritis, Adult Pyelonephritis is a kidney infection. The kidneys are the organs that filter a person's blood and move waste out of the bloodstream and into the urine. Urine passes from the kidneys, through the ureters, and into the bladder. There are two main types of pyelonephritis:  Infections that come on quickly without any warning (acute pyelonephritis).  Infections that last for a long period of time (chronic pyelonephritis). In most cases, the infection clears up with treatment and does not cause further problems. More severe infections or chronic infections can sometimes spread to the bloodstream or lead to other problems with the kidneys. CAUSES This condition is usually caused by:  Bacteria traveling from the bladder to the kidney through infected urine. The urine in the bladder can become infected with bacteria from:  Bladder infection (cystitis).  Inflammation of the prostate gland (prostatitis).  Sexual intercourse, in females.  Bacteria traveling from the bloodstream to the kidney. RISK FACTORS This condition is more likely to develop in:  Pregnant women.  Older  people.  People who have diabetes.  People who have kidney stones or bladder stones.  People who have other abnormalities of the kidney or ureter.  People who have a catheter placed in the bladder.  People who have cancer.  People who are sexually active.  Women who use spermicides.  People who have had a prior urinary tract infection. SYMPTOMS Symptoms of this condition include:  Frequent urination.  Strong or persistent urge to urinate.  Burning or stinging when urinating.  Abdominal pain.  Back pain.  Pain in the side or flank area.  Fever.  Chills.  Blood in the urine, or dark urine.  Nausea.  Vomiting. DIAGNOSIS This condition may be diagnosed based on:  Medical history and physical exam.  Urine tests.  Blood tests. You may also have imaging tests of the kidneys, such as an ultrasound or CT scan. TREATMENT Treatment for this condition may depend on the severity of the infection.  If the infection is mild and is found early, you may be treated with antibiotic medicines taken by mouth. You will need to drink fluids to remain hydrated.  If the infection is more severe, you may need to stay in the hospital and receive antibiotics given directly into a vein through an IV tube. You may also need to receive fluids through an IV tube if you are not able to remain hydrated. After your hospital stay, you may need to take oral antibiotics for a period of time. Other treatments may be required, depending on the cause of the infection. HOME CARE INSTRUCTIONS Medicines  Take over-the-counter and prescription medicines only as told by your health care provider.  If you were prescribed an antibiotic medicine, take it as told by your health care provider. Do not stop taking the antibiotic even if you start to feel better. General Instructions  Drink enough fluid to keep your urine clear or pale yellow.  Avoid caffeine, tea, and carbonated beverages. They tend to  irritate the bladder.  Urinate often. Avoid holding in urine for long periods of time.  Urinate before and after sex.  After a bowel movement, women should cleanse from front to back. Use each tissue only once.  Keep all follow-up visits as told by your health care provider. This is important. SEEK MEDICAL CARE IF:  Your symptoms do not get better after 2 days of treatment.  Your symptoms get worse.  You have a fever. SEEK IMMEDIATE MEDICAL CARE IF:  You are unable to take your antibiotics or fluids.  You have shaking chills.  You vomit.  You have severe flank or back pain.  You have extreme weakness or fainting.   This information is not intended to replace advice given to you by your health care provider. Make sure you discuss any questions you have with your health care provider.   Document Released: 05/29/2005 Document Revised: 02/17/2015 Document Reviewed: 09/21/2014 Elsevier Interactive Patient Education [employer].

## 2016-04-14 NOTE — Progress Notes (Signed)
Subjective:  By signing my name below, I, Valerie [name], attest that this documentation has been prepared under the direction and in the presence of Delman Cheadle, MD. Electronically Signed: Moises [name], [employer]. 04/14/2016 , 11:44 AM .  Patient was seen in Room 2 .   Patient ID: Valerie [name], female    DOB: [birth year], 55 y.o.   MRN: [id number] Chief Complaint  Patient presents with  . Follow-up    for bloodwork    HPI Valerie [name] is a 55 y.o. female who presents to [facility] for follow up on [name] work. Patient presented yesterday with 1 week of increasing urinary frequency and 2~3 days of dysuria with a metallic taste in her mouth. She's only had this once prior, when she developed renal failure from pyelonephritis in July of this year. She has an underlying rheumatologic disease of unknown etiology, followed by nephrology and rheumatology.   About 3 months prior, her creatinine had spiked at 5.6 and after treatment and hydration, she returned to prior baseline of 1.14. Patient's urine did show leukocytosis at our building next door, so microscopy was unable to be obtained. She had mild white count of 10.6 so she was treated with rocephin 1g. She was asked to return today for further treatment and evaluation. Patient does appear to mildly dehydrated with creatinine 1.24 and BUN of 26.   Patient states her symptoms have slightly improved over night. She still has some dysuria with flank pain last night. She was still getting up with nocturia last night. Her appetite did improve a bit. She notes the metallic taste has improved but still present. She denies swelling in her legs or finding any new rashes.   Past Medical History:  Diagnosis Date  . Acute ischemic colitis ([place])   . Anemia 05/01/2012  . Anxiety   . ARF (acute renal failure) ([place]) 12/28/2015  . Arthritis    "basically ~ q joint" (12/28/2015)  . Asthma   . Basal cell carcinoma of upper back ~ 2011  . Chronic lower back pain    . DDD (degenerative disc disease), lumbar   . Diarrhea    EPISODIC  . Fibromyalgia   . First degree heart block   . Gastro-esophageal reflux   . Headache(784.0)    "muscular w/migraine intensity; ~ 5-6 months" (12/28/2015)  . Hypertension   . Mental disorder   . Nerve pain    nerve damage to right arm  . Neuromuscular disorder ([place])   . Pancreatitis   . Pneumonia ~ 2014; 08/2015  . Polymyalgia rheumatica ([place]) onset 08/2015  . Precancerous melanosis    "2nd toe left foot; 5th toe of right foot; right temple; had them all cut off"  . Sleep apnea    CPAP-9   Prior to Admission medications   Medication Sig Start Date End Date Taking? Authorizing Provider  albuterol (PROVENTIL HFA;VENTOLIN HFA) 108 (90 BASE) MCG/ACT inhaler Inhale 2 puffs into the lungs as needed for wheezing or shortness of breath. 03/04/15  Yes [name], MD  allopurinol (ZYLOPRIM) 300 MG tablet Take 0.5 tablets (150 mg total) by mouth daily. 11/24/15  Yes [name], MD  ALPRAZolam Duanne Moron) 1 MG tablet Take 1 tablet (1 mg total) by mouth 3 (three) times daily as needed for anxiety. 02/18/16  Yes Darlyne Russian, MD  baclofen (LIORESAL) 10 MG tablet TAKE 1 TABLET (10 MG TOTAL) BY MOUTH 3 (THREE TIMES DAILY) 01/12/16  Yes Darlyne Russian, MD  benazepril (LOTENSIN)  40 MG tablet Take 1 tablet (40 mg total) by mouth daily. 02/18/16  Yes Darlyne Russian, MD  Biotin 5000 MCG CAPS Take 10,000 mcg by mouth daily.    Yes Historical Provider, MD  cetirizine (ZYRTEC) 10 MG tablet Take 10 mg by mouth daily.   Yes Historical Provider, MD  Cholecalciferol (VITAMIN D) 2000 UNITS tablet Take 2,000 Units by mouth daily.   Yes Historical Provider, MD  clotrimazole-betamethasone (LOTRISONE) cream Apply 1 application topically 2 (two) times daily. 11/24/15  Yes [name], MD  dexlansoprazole (DEXILANT) 60 MG capsule Take 60 mg by mouth 2 (two) times daily.   Yes Historical Provider, MD  fexofenadine (ALLEGRA) 180 MG tablet Take 180 mg by  mouth daily. Reported on 07/17/2015   Yes Historical Provider, MD  fluticasone (FLONASE) 50 MCG/ACT nasal spray Place 2 sprays into both nostrils daily. 01/12/16  Yes Darlyne Russian, MD  HYDROcodone-acetaminophen (NORCO) 10-325 MG per tablet Take 1-2 tablets by mouth every 6 (six) hours as needed (pain.).   Yes Historical Provider, MD  metoprolol succinate (TOPROL-XL) 100 MG 24 hr tablet Take 1 tablet (100 mg total) by mouth daily. Take with or immediately following a meal. 07/31/15  Yes [name], MD  modafinil (PROVIGIL) 200 MG tablet Take 0.5 tablets (100 mg total) by mouth 2 (two) times daily. 12/30/15  Yes Carmen Dohmeier, MD  montelukast (SINGULAIR) 10 MG tablet TAKE 1 TABLET (10 MG TOTAL) BY MOUTH AT BEDTIME 11/24/15  Yes [name], MD  morphine (MS CONTIN) 30 MG 12 hr tablet Take 30 mg by mouth every 8 (eight) hours. Not from [facility] 09/23/12  Yes Historical Provider, MD  Multiple Vitamins-Minerals (MULTIVITAMIN PO) Take 1 tablet by mouth daily.   Yes Historical Provider, MD  Olopatadine HCl (PATADAY) 0.2 % SOLN APPLY 1 DROP TO EYE 2 (TWO) TIMES DAILY. 07/05/15  Yes [name], MD  Pancrelipase, Lip-Prot-Amyl, (CREON) 24000 UNITS CPEP Take 48,000 Units by mouth 4 (four) times daily as needed (for pancreatitis).   Yes Historical Provider, MD  predniSONE (DELTASONE) 5 MG tablet Take 1 tablet every other day 02/18/16  Yes Darlyne Russian, MD  promethazine (PHENERGAN) 25 MG tablet Take 1 tablet (25 mg total) by mouth every 6 (six) hours as needed for nausea or vomiting. 12/27/14  Yes [name], MD  spironolactone (ALDACTONE) 100 MG tablet Take 1 tablet (100 mg total) by mouth daily. 11/24/15  Yes [name], MD  sucralfate (CARAFATE) 1 GM/10ML suspension Take 10 mLs (1 g total) by mouth 4 (four) times daily -  with meals and at bedtime. 05/01/15  Yes [name], MD   Allergies  Allergen Reactions  . Dilaudid [Hydromorphone Hcl] Other (See Comments)    Headache   . Pregabalin   .  Metoclopramide Hcl Other (See Comments)    "crawling inside"  . Neurontin [Gabapentin] Other (See Comments)    Tremors   . Other Other (See Comments)    Avoid all SSRI's and SNRI's- damaged pancreas  . Reglan [Metoclopramide]    Review of Systems  Constitutional: Positive for appetite change and fatigue. Negative for chills and fever.  Cardiovascular: Negative for leg swelling.  Genitourinary: Positive for dysuria, flank pain and frequency. Negative for urgency.  Skin: Negative for rash and wound.  Neurological: Negative for dizziness, weakness and light-headedness.       Objective:   Physical Exam  Constitutional: She is oriented to person, place, and time. She appears well-developed and well-nourished. No distress.  HENT:  Head: Normocephalic and atraumatic.  Eyes: EOM are normal. Pupils are equal, round, and reactive to light.  Neck: Neck supple.  Cardiovascular: Normal rate.   Pulmonary/Chest: Effort normal. No respiratory distress.  Musculoskeletal: Normal range of motion.  Neurological: She is alert and oriented to person, place, and time.  Skin: Skin is warm and dry.  Psychiatric: She has a normal mood and affect. Her behavior is normal.  Nursing note and vitals reviewed.   BP 124/72   Pulse 71   Temp 98.7 F (37.1 C) (Oral)   Resp 18   Ht 5\' 5"  (1.651 m)   Wt 286 lb 6.4 oz (129.9 kg)   SpO2 97%   BMI 47.66 kg/m     Assessment & Plan:   1. Acute pyelonephritis   2. Acute renal failure superimposed on stage 2 chronic kidney disease, unspecified acute renal failure type ([place])    Dose #2 of rocephin 1g x 3d series. Gave 1L IVF with Rocephin in it.  Recheck tomorrow.  Orders Placed This Encounter  Procedures  . Basic metabolic panel    Order Specific Question:   Has the patient fasted?    Answer:   No  . Insert peripheral IV    Meds ordered this encounter  Medications  . cefTRIAXone (ROCEPHIN) injection 1 g    I personally performed the services  described in this documentation, which was scribed in my presence. The recorded information has been reviewed and considered, and addended by me as needed.   Delman Cheadle, M.D.  [facility] [address] [place], [place] [address] [phone] phone [phone] fax  04/21/16 11:58 PM

## 2016-04-15 ENCOUNTER — Ambulatory Visit (INDEPENDENT_AMBULATORY_CARE_PROVIDER_SITE_OTHER): Payer: Medicare Other | Admitting: Family Medicine

## 2016-04-15 ENCOUNTER — Other Ambulatory Visit: Payer: Self-pay

## 2016-04-15 VITALS — BP 134/94 | HR 67 | Temp 98.3°F | Resp 18 | Ht 65.0 in | Wt 287.8 lb

## 2016-04-15 DIAGNOSIS — N10 Acute tubulo-interstitial nephritis: Secondary | ICD-10-CM

## 2016-04-15 DIAGNOSIS — E790 Hyperuricemia without signs of inflammatory arthritis and tophaceous disease: Secondary | ICD-10-CM | POA: Diagnosis not present

## 2016-04-15 DIAGNOSIS — B373 Candidiasis of vulva and vagina: Secondary | ICD-10-CM | POA: Diagnosis not present

## 2016-04-15 DIAGNOSIS — B3731 Acute candidiasis of vulva and vagina: Secondary | ICD-10-CM

## 2016-04-15 LAB — POCT URINALYSIS DIP (MANUAL ENTRY)
BILIRUBIN UA: NEGATIVE
BILIRUBIN UA: NEGATIVE
Blood, UA: NEGATIVE
GLUCOSE UA: NEGATIVE
LEUKOCYTES UA: NEGATIVE
Nitrite, UA: NEGATIVE
PROTEIN UA: NEGATIVE
Urobilinogen, UA: 0.2
pH, UA: 5.5

## 2016-04-15 LAB — URINE CULTURE: ORGANISM ID, BACTERIA: NO GROWTH

## 2016-04-15 LAB — BASIC METABOLIC PANEL
BUN: 23 mg/dL (ref 7–25)
CHLORIDE: 104 mmol/L (ref 98–110)
CO2: 22 mmol/L (ref 20–31)
Calcium: 9.2 mg/dL (ref 8.6–10.4)
Creat: 1.07 mg/dL — ABNORMAL HIGH (ref 0.50–1.05)
GLUCOSE: 107 mg/dL — AB (ref 65–99)
POTASSIUM: 4.7 mmol/L (ref 3.5–5.3)
SODIUM: 137 mmol/L (ref 135–146)

## 2016-04-15 LAB — POC MICROSCOPIC URINALYSIS (UMFC): MUCUS RE: ABSENT

## 2016-04-15 MED ORDER — FLUCONAZOLE 150 MG PO TABS: 150.0000 mg | ORAL_TABLET | Freq: Once | ORAL | 0 refills | Status: AC

## 2016-04-15 MED ORDER — CEPHALEXIN 500 MG PO CAPS: 500.0000 mg | ORAL_CAPSULE | Freq: Two times a day (BID) | ORAL | 0 refills | Status: DC

## 2016-04-15 MED ORDER — CEFTRIAXONE SODIUM 1 G IJ SOLR
1.0000 g | Freq: Once | INTRAMUSCULAR | Status: AC
  Administered 2016-04-15: 1 g via INTRAMUSCULAR

## 2016-04-15 NOTE — Patient Instructions (Addendum)
IF you received an x-ray today, you will receive an invoice from [facility]. Please contact [facility] at [phone] with questions or concerns regarding your invoice.   IF you received labwork today, you will receive an invoice from [employer]. Please contact [facility] at [phone] with questions or concerns regarding your invoice.   Our billing staff will not be able to assist you with questions regarding bills from these companies.  You will be contacted with the lab results as soon as they are available. The fastest way to get your results is to activate your My Chart account. Instructions are located on the last page of this paperwork. If you have not heard from [place] regarding the results in 2 weeks, please contact this office.     Acute Kidney Injury Acute kidney injury is any condition in which there is sudden (acute) damage to the kidneys. Acute kidney injury was previously known as acute kidney failure or acute renal failure. The kidneys are two organs that lie on either side of the spine between the middle of the back and the front of the abdomen. The kidneys:  Remove wastes and extra water from the blood.   Produce important hormones. These help keep bones strong, regulate blood pressure, and help create red blood cells.   Balance the fluids and chemicals in the blood and tissues. A small amount of kidney damage may not cause problems, but a large amount of damage may make it difficult or impossible for the kidneys to work the way they should. Acute kidney injury may develop into long-lasting (chronic) kidney disease. It may also develop into a life-threatening disease called end-stage kidney disease. Acute kidney injury can get worse very quickly, so it should be treated right away. Early treatment may prevent other kidney diseases from developing. CAUSES   A problem with blood flow to the kidneys. This may be caused by:    Blood loss.   Heart disease.   Severe burns.   Liver disease.  Direct damage to the kidneys. This may be caused by:  Some medicines.   A kidney infection.   Poisoning or consuming toxic substances.   A surgical wound.   A blow to the kidney area.   A problem with urine flow. This may be caused by:   Cancer.   Kidney stones.   An enlarged prostate. SIGNS AND SYMPTOMS   Swelling (edema) of the legs, ankles, or feet.   Tiredness (lethargy).   Nausea or vomiting.   Confusion.   Problems with urination, such as:   Painful or burning feeling during urination.   Decreased urine production.   Frequent accidents in children who are potty trained.   Bloody urine.   Muscle twitches and cramps.   Shortness of breath.   Seizures.   Chest pain or pressure. Sometimes, no symptoms are present. DIAGNOSIS Acute kidney injury may be detected and diagnosed by tests, including blood, urine, imaging, or kidney biopsy tests.  TREATMENT Treatment of acute kidney injury varies depending on the cause and severity of the kidney damage. In mild cases, no treatment may be needed. The kidneys may heal on their own. If acute kidney injury is more severe, your health care provider will treat the cause of the kidney damage, help the kidneys heal, and prevent complications from occurring. Severe cases may require a procedure to remove toxic wastes from the body (dialysis) or surgery to repair kidney damage. Surgery may involve:  Repair of a torn kidney.   Removal of an obstruction. HOME CARE INSTRUCTIONS  Follow your prescribed diet.  Take medicines only as directed by your health care provider.  Do not take any new medicines (prescription, over-the-counter, or nutritional supplements) unless approved by your health care provider. Many medicines can worsen your kidney damage or may need to have the dose adjusted.   Keep all follow-up visits as  directed by your health care provider. This is important.  Observe your condition to make sure you are healing as expected. SEEK IMMEDIATE MEDICAL CARE IF:  You are feeling ill or have severe pain in the back or side.   Your symptoms return or you have new symptoms.  You have any symptoms of end-stage kidney disease. These include:   Persistent itchiness.   Loss of appetite.   Headaches.   Abnormally dark or light skin.  Numbness in the hands or feet.   Easy bruising.   Frequent hiccups.   Menstruation stops.   You have a fever.  You have increased urine production.  You have pain or bleeding when urinating. MAKE SURE YOU:   Understand these instructions.  Will watch your condition.  Will get help right away if you are not doing well or get worse.   This information is not intended to replace advice given to you by your health care provider. Make sure you discuss any questions you have with your health care provider.   Document Released: 12/12/2010 Document Revised: 06/19/2014 Document Reviewed: 01/26/2012 Elsevier Interactive Patient Education [employer].

## 2016-04-15 NOTE — Progress Notes (Signed)
Subjective:  By signing my name below, I, [name], attest that this documentation has been prepared under the direction and in the presence of Delman Cheadle, MD.  Electronically Signed: Thea Alken, ED Scribe. 04/15/2016. 12:14 PM.   Patient ID: Valerie [name], female    DOB: Jun 10, 1961, 56 y.o.   MRN: [id number]  HPI   Chief Complaint  Patient presents with  . Follow-up   HPI Comments: Valerie [name] is a 55 y.o. female who presents to the [facility] for a follow up. Pt's symptoms are unchanged since last visit. She reports not feeling well, but is unable to pinpoint symptoms. She stills has some dysuria and nausea as well as new leg swelling and some facial swelling. She also notes metallic taste in her mouth and right flank discomfort has resolved.  Patient Active Problem List   Diagnosis Date Noted  . ARF (acute renal failure) ([place]) 12/28/2015  . Altered mental status 12/28/2015  . CKD (chronic kidney disease) stage 3, GFR 30-59 ml/min   . Chronic pain syndrome   . Knee pain, acute   . OSA (obstructive sleep apnea)   . Morbid obesity ([place])   . Myalgia 08/19/2014  . Neoplasm of uncertain behavior of skin 02/03/2014  . Sleep apnea with use of continuous positive airway pressure (CPAP) 04/15/2013  . Obesity, morbid ([place]) 10/21/2012  . OSA on CPAP 10/21/2012  . Fibromyalgia 06/22/2012  . Back pain 06/22/2012  . Recurrent pancreatitis ([place]) 06/22/2012  . Depression 06/22/2012  . Anemia 05/01/2012  . Colitis 04/28/2012  . Leukocytosis 04/28/2012  . Colon polyp 08/01/2011  . PTSD (post-traumatic stress disorder) 08/01/2011  . Hypertension 08/01/2011  . IBS (irritable bowel syndrome) 08/01/2011   Past Medical History:  Diagnosis Date  . Acute ischemic colitis ([place])   . Anemia 05/01/2012  . Anxiety   . ARF (acute renal failure) ([place]) 12/28/2015  . Arthritis    "basically ~ q joint" (12/28/2015)  . Asthma   . Basal cell carcinoma of upper back ~  2011  . Chronic lower back pain   . DDD (degenerative disc disease), lumbar   . Diarrhea    EPISODIC  . Fibromyalgia   . First degree heart block   . Gastro-esophageal reflux   . Headache(784.0)    "muscular w/migraine intensity; ~ 5-6 months" (12/28/2015)  . Hypertension   . Mental disorder   . Nerve pain    nerve damage to right arm  . Neuromuscular disorder ([place])   . Pancreatitis   . Pneumonia ~ 2014; 08/2015  . Polymyalgia rheumatica ([place]) onset 08/2015  . Precancerous melanosis    "2nd toe left foot; 5th toe of right foot; right temple; had them all cut off"  . Sleep apnea    CPAP-9   Past Surgical History:  Procedure Laterality Date  . BASAL CELL CARCINOMA EXCISION  ~ 2011   back  . BOTOX INJECTION N/A 11/06/2012   Procedure: BOTOX INJECTION;  Surgeon: Missy Sabins, MD;  Location: [facility];  Service: Endoscopy;  Laterality: N/A;  . BOTOX INJECTION N/A 12/04/2013   Procedure: BOTOX INJECTION;  Surgeon: Missy Sabins, MD;  Location: [place];  Service: Endoscopy;  Laterality: N/A;  . BOTOX INJECTION N/A 02/26/2014   Procedure: BOTOX INJECTION;  Surgeon: Missy Sabins, MD;  Location: [place];  Service: Endoscopy;  Laterality: N/A;  . BOTOX INJECTION N/A 07/08/2015   Procedure: BOTOX INJECTION;  Surgeon: Teena Irani, MD;  Location: [facility];  Service: Endoscopy;  Laterality: N/A;  . ESOPHAGOGASTRODUODENOSCOPY  12/21/2011   Procedure: ESOPHAGOGASTRODUODENOSCOPY (EGD);  Surgeon: Missy Sabins, MD;  Location: [facility] ENDOSCOPY;  Service: Endoscopy;  Laterality: N/A;  . ESOPHAGOGASTRODUODENOSCOPY N/A 11/06/2012   Procedure: ESOPHAGOGASTRODUODENOSCOPY (EGD);  Surgeon: Missy Sabins, MD;  Location: [facility];  Service: Endoscopy;  Laterality: N/A;  . ESOPHAGOGASTRODUODENOSCOPY N/A 12/04/2013   Procedure: ESOPHAGOGASTRODUODENOSCOPY (EGD);  Surgeon: Missy Sabins, MD;  Location: [facility] ENDOSCOPY;  Service: Endoscopy;  Laterality: N/A;  . ESOPHAGOGASTRODUODENOSCOPY N/A 02/26/2014    Procedure: ESOPHAGOGASTRODUODENOSCOPY (EGD);  Surgeon: Missy Sabins, MD;  Location: [facility] ENDOSCOPY;  Service: Endoscopy;  Laterality: N/A;  . ESOPHAGOGASTRODUODENOSCOPY (EGD) WITH PROPOFOL N/A 07/08/2015   Procedure: ESOPHAGOGASTRODUODENOSCOPY (EGD) WITH PROPOFOL;  Surgeon: Teena Irani, MD;  Location: [facility];  Service: Endoscopy;  Laterality: N/A;  . FLEXIBLE SIGMOIDOSCOPY  05/01/2012   Procedure: FLEXIBLE SIGMOIDOSCOPY;  Surgeon: Jeryl Columbia, MD;  Location: [facility];  Service: Endoscopy;  Laterality: N/A;  unprepped  . LAPAROSCOPIC CHOLECYSTECTOMY  01/2002  . NASAL SEPTOPLASTY W/ TURBINOPLASTY  1998  . SHOULDER ARTHROSCOPY WITH ROTATOR CUFF REPAIR AND OPEN BICEPS TENODESIS Bilateral 2010-2013   left-right   Allergies  Allergen Reactions  . Dilaudid [Hydromorphone Hcl] Other (See Comments)    Headache   . Pregabalin   . Metoclopramide Hcl Other (See Comments)    "crawling inside"  . Neurontin [Gabapentin] Other (See Comments)    Tremors   . Other Other (See Comments)    Avoid all SSRI's and SNRI's- damaged pancreas  . Reglan [Metoclopramide]    Prior to Admission medications   Medication Sig Start Date End Date Taking? Authorizing Provider  albuterol (PROVENTIL HFA;VENTOLIN HFA) 108 (90 BASE) MCG/ACT inhaler Inhale 2 puffs into the lungs as needed for wheezing or shortness of breath. 03/04/15  Yes [name], MD  allopurinol (ZYLOPRIM) 300 MG tablet Take 0.5 tablets (150 mg total) by mouth daily. 11/24/15  Yes [name], MD  ALPRAZolam Duanne Moron) 1 MG tablet Take 1 tablet (1 mg total) by mouth 3 (three) times daily as needed for anxiety. 02/18/16  Yes Darlyne Russian, MD  baclofen (LIORESAL) 10 MG tablet TAKE 1 TABLET (10 MG TOTAL) BY MOUTH 3 (THREE TIMES DAILY) 01/12/16  Yes Darlyne Russian, MD  benazepril (LOTENSIN) 40 MG tablet Take 1 tablet (40 mg total) by mouth daily. 02/18/16  Yes Darlyne Russian, MD  Biotin 5000 MCG CAPS Take 10,000 mcg by mouth daily.    Yes Historical  Provider, MD  cetirizine (ZYRTEC) 10 MG tablet Take 10 mg by mouth daily.   Yes Historical Provider, MD  Cholecalciferol (VITAMIN D) 2000 UNITS tablet Take 2,000 Units by mouth daily.   Yes Historical Provider, MD  clotrimazole-betamethasone (LOTRISONE) cream Apply 1 application topically 2 (two) times daily. 11/24/15  Yes [name], MD  dexlansoprazole (DEXILANT) 60 MG capsule Take 60 mg by mouth 2 (two) times daily.   Yes Historical Provider, MD  fexofenadine (ALLEGRA) 180 MG tablet Take 180 mg by mouth daily. Reported on 07/17/2015   Yes Historical Provider, MD  fluticasone (FLONASE) 50 MCG/ACT nasal spray Place 2 sprays into both nostrils daily. 01/12/16  Yes Darlyne Russian, MD  HYDROcodone-acetaminophen (NORCO) 10-325 MG per tablet Take 1-2 tablets by mouth every 6 (six) hours as needed (pain.).   Yes Historical Provider, MD  metoprolol succinate (TOPROL-XL) 100 MG 24 hr tablet Take 1 tablet (100 mg total) by mouth daily. Take with or immediately  following a meal. 07/31/15  Yes [name], MD  modafinil (PROVIGIL) 200 MG tablet Take 0.5 tablets (100 mg total) by mouth 2 (two) times daily. 12/30/15  Yes Carmen Dohmeier, MD  montelukast (SINGULAIR) 10 MG tablet TAKE 1 TABLET (10 MG TOTAL) BY MOUTH AT BEDTIME 11/24/15  Yes [name], MD  morphine (MS CONTIN) 30 MG 12 hr tablet Take 30 mg by mouth every 8 (eight) hours. Not from [facility] 09/23/12  Yes Historical Provider, MD  Multiple Vitamins-Minerals (MULTIVITAMIN PO) Take 1 tablet by mouth daily.   Yes Historical Provider, MD  Olopatadine HCl (PATADAY) 0.2 % SOLN APPLY 1 DROP TO EYE 2 (TWO) TIMES DAILY. 07/05/15  Yes [name], MD  Pancrelipase, Lip-Prot-Amyl, (CREON) 24000 UNITS CPEP Take 48,000 Units by mouth 4 (four) times daily as needed (for pancreatitis).   Yes Historical Provider, MD  predniSONE (DELTASONE) 5 MG tablet Take 1 tablet every other day 02/18/16  Yes Darlyne Russian, MD  promethazine (PHENERGAN) 25 MG tablet Take 1 tablet  (25 mg total) by mouth every 6 (six) hours as needed for nausea or vomiting. 12/27/14  Yes [name], MD  spironolactone (ALDACTONE) 100 MG tablet Take 1 tablet (100 mg total) by mouth daily. 11/24/15  Yes [name], MD  sucralfate (CARAFATE) 1 GM/10ML suspension Take 10 mLs (1 g total) by mouth 4 (four) times daily -  with meals and at bedtime. 05/01/15  Yes [name], MD   Social History   Social History  . Marital status: Single    Spouse name: N/A  . Number of children: 0  . Years of education: College   Occupational History  . disabled    Social History Main Topics  . Smoking status: Never Smoker  . Smokeless tobacco: Never Used  . Alcohol use No  . Drug use: No  . Sexual activity: Not Currently   Other Topics Concern  . Not on file   Social History Narrative   Caffeine 2 cups iced tea daily avg.   Review of Systems  Constitutional: Negative for chills and fever.  Cardiovascular: Positive for leg swelling.  Gastrointestinal: Positive for nausea.  Genitourinary: Positive for dysuria. Negative for flank pain.       Objective:   Physical Exam  Constitutional: She is oriented to person, place, and time. She appears well-developed and well-nourished. No distress.  Eyes: Conjunctivae are normal.  Neck: Neck supple.  Cardiovascular: Normal rate, regular rhythm and normal heart sounds.   No murmur heard. Pulmonary/Chest: Effort normal and breath sounds normal. She has no wheezes. She has no rales.  Neurological: She is alert and oriented to person, place, and time.  Skin: Skin is warm and dry.  Psychiatric: She has a normal mood and affect. Her behavior is normal.    Vitals:   04/15/16 1119  BP: (!) 134/94  Pulse: 67  Resp: 18  Temp: 98.3 F (36.8 C)  TempSrc: Oral  SpO2: 97%  Weight: 287 lb 12.8 oz (130.5 kg)  Height: 5\' 5"  (1.651 m)    Results for orders placed or performed in visit on 04/15/16  POCT urinalysis dipstick  Result Value Ref  Range   Color, UA yellow yellow   Clarity, UA clear clear   Glucose, UA negative negative   Bilirubin, UA negative negative   Ketones, POC UA negative negative   Spec Grav, UA <=1.005    Blood, UA negative negative   pH, UA 5.5    Protein Ur, POC negative  negative   Urobilinogen, UA 0.2    Nitrite, UA Negative Negative   Leukocytes, UA Negative Negative  POCT Microscopic Urinalysis ([facility])  Result Value Ref Range   WBC,UR,HPF,POC Moderate (A) None WBC/hpf   RBC,UR,HPF,POC None None RBC/hpf   Bacteria Few (A) None, Too numerous to count   Mucus Absent Absent   Epithelial Cells, UR Per Microscopy None None, Too numerous to count cells/hpf   Assessment & Plan:   1. Acute pyelonephritis - 3rd dose of rocephin 1g IM qd given today. Pt starting to feel better.  I think it would be fine to complete the antibiotic course now but sent in course of Keflex in case pt feels sxs recur tomorrow.   H/o gout - refilled allopurinol - need to discuss at f/u and check uric acid with next labs.  Vag moniliasis - from abx. Diflucan per pt request.  Orders Placed This Encounter  Procedures  . POCT urinalysis dipstick  . POCT Microscopic Urinalysis ([facility])    Meds ordered this encounter  Medications  . cefTRIAXone (ROCEPHIN) injection 1 g  . cephALEXin (KEFLEX) 500 MG capsule    Sig: Take 1 capsule (500 mg total) by mouth 2 (two) times daily.    Dispense:  14 capsule    Refill:  0  . fluconazole (DIFLUCAN) 150 MG tablet    Sig: Take 1 tablet (150 mg total) by mouth once. Repeat if needed after 3 days    Dispense:  2 tablet    Refill:  0    I personally performed the services described in this documentation, which was scribed in my presence. The recorded information has been reviewed and considered, and addended by me as needed.   Delman Cheadle, M.D.  [facility] [address] [place], [place] [address] [phone] phone [phone] fax  04/17/16 10:25  AM

## 2016-04-15 NOTE — Telephone Encounter (Signed)
Pt. States you discussed diflucan at her appt. Today but it did not get called to pharmacy. Please send in.

## 2016-04-16 ENCOUNTER — Other Ambulatory Visit: Payer: Self-pay | Admitting: Family Medicine

## 2016-04-16 DIAGNOSIS — E790 Hyperuricemia without signs of inflammatory arthritis and tophaceous disease: Secondary | ICD-10-CM

## 2016-04-17 DIAGNOSIS — M542 Cervicalgia: Secondary | ICD-10-CM | POA: Diagnosis not present

## 2016-04-17 MED ORDER — ALLOPURINOL 300 MG PO TABS: 150.0000 mg | ORAL_TABLET | Freq: Every day | ORAL | 1 refills | Status: DC

## 2016-04-17 NOTE — Telephone Encounter (Signed)
You can see from her chart that this was done 2d prior.

## 2016-04-21 ENCOUNTER — Encounter (HOSPITAL_COMMUNITY): Payer: Self-pay | Admitting: *Deleted

## 2016-04-27 ENCOUNTER — Other Ambulatory Visit: Payer: Self-pay | Admitting: Gastroenterology

## 2016-04-27 ENCOUNTER — Ambulatory Visit (HOSPITAL_COMMUNITY): Payer: Medicare Other | Admitting: Anesthesiology

## 2016-04-27 ENCOUNTER — Encounter (HOSPITAL_COMMUNITY)
Admission: RE | Disposition: A | Discharge status: Home / Self Care | Payer: Self-pay | Source: Ambulatory Visit | Attending: Gastroenterology

## 2016-04-27 ENCOUNTER — Encounter (HOSPITAL_COMMUNITY): Payer: Self-pay | Admitting: *Deleted

## 2016-04-27 ENCOUNTER — Ambulatory Visit (HOSPITAL_COMMUNITY)
Admission: RE | Admit: 2016-04-27 | Discharge: 2016-04-27 | Disposition: A | Discharge status: Home / Self Care | Payer: Medicare Other | Source: Ambulatory Visit | Attending: Gastroenterology | Admitting: Gastroenterology

## 2016-04-27 DIAGNOSIS — K589 Irritable bowel syndrome without diarrhea: Secondary | ICD-10-CM | POA: Diagnosis not present

## 2016-04-27 DIAGNOSIS — M109 Gout, unspecified: Secondary | ICD-10-CM | POA: Insufficient documentation

## 2016-04-27 DIAGNOSIS — M353 Polymyalgia rheumatica: Secondary | ICD-10-CM | POA: Insufficient documentation

## 2016-04-27 DIAGNOSIS — M5136 Other intervertebral disc degeneration, lumbar region: Secondary | ICD-10-CM | POA: Insufficient documentation

## 2016-04-27 DIAGNOSIS — K219 Gastro-esophageal reflux disease without esophagitis: Secondary | ICD-10-CM | POA: Insufficient documentation

## 2016-04-27 DIAGNOSIS — M25569 Pain in unspecified knee: Secondary | ICD-10-CM | POA: Insufficient documentation

## 2016-04-27 DIAGNOSIS — G894 Chronic pain syndrome: Secondary | ICD-10-CM | POA: Diagnosis not present

## 2016-04-27 DIAGNOSIS — F329 Major depressive disorder, single episode, unspecified: Secondary | ICD-10-CM | POA: Diagnosis not present

## 2016-04-27 DIAGNOSIS — D72829 Elevated white blood cell count, unspecified: Secondary | ICD-10-CM | POA: Diagnosis not present

## 2016-04-27 DIAGNOSIS — D649 Anemia, unspecified: Secondary | ICD-10-CM | POA: Insufficient documentation

## 2016-04-27 DIAGNOSIS — R4182 Altered mental status, unspecified: Secondary | ICD-10-CM | POA: Diagnosis not present

## 2016-04-27 DIAGNOSIS — Z6841 Body Mass Index (BMI) 40.0 and over, adult: Secondary | ICD-10-CM | POA: Insufficient documentation

## 2016-04-27 DIAGNOSIS — N183 Chronic kidney disease, stage 3 (moderate): Secondary | ICD-10-CM | POA: Diagnosis not present

## 2016-04-27 DIAGNOSIS — Z79899 Other long term (current) drug therapy: Secondary | ICD-10-CM | POA: Insufficient documentation

## 2016-04-27 DIAGNOSIS — K3184 Gastroparesis: Secondary | ICD-10-CM | POA: Diagnosis not present

## 2016-04-27 DIAGNOSIS — J45909 Unspecified asthma, uncomplicated: Secondary | ICD-10-CM | POA: Insufficient documentation

## 2016-04-27 DIAGNOSIS — K861 Other chronic pancreatitis: Secondary | ICD-10-CM | POA: Insufficient documentation

## 2016-04-27 DIAGNOSIS — R1013 Epigastric pain: Secondary | ICD-10-CM | POA: Diagnosis not present

## 2016-04-27 DIAGNOSIS — N10 Acute tubulo-interstitial nephritis: Secondary | ICD-10-CM | POA: Insufficient documentation

## 2016-04-27 DIAGNOSIS — M797 Fibromyalgia: Secondary | ICD-10-CM | POA: Diagnosis not present

## 2016-04-27 DIAGNOSIS — Z86010 Personal history of colonic polyps: Secondary | ICD-10-CM | POA: Insufficient documentation

## 2016-04-27 DIAGNOSIS — M542 Cervicalgia: Secondary | ICD-10-CM | POA: Diagnosis not present

## 2016-04-27 DIAGNOSIS — I129 Hypertensive chronic kidney disease with stage 1 through stage 4 chronic kidney disease, or unspecified chronic kidney disease: Secondary | ICD-10-CM | POA: Diagnosis not present

## 2016-04-27 DIAGNOSIS — Z85828 Personal history of other malignant neoplasm of skin: Secondary | ICD-10-CM | POA: Diagnosis not present

## 2016-04-27 DIAGNOSIS — M1990 Unspecified osteoarthritis, unspecified site: Secondary | ICD-10-CM | POA: Insufficient documentation

## 2016-04-27 DIAGNOSIS — G4733 Obstructive sleep apnea (adult) (pediatric): Secondary | ICD-10-CM | POA: Diagnosis not present

## 2016-04-27 DIAGNOSIS — N179 Acute kidney failure, unspecified: Secondary | ICD-10-CM | POA: Diagnosis not present

## 2016-04-27 DIAGNOSIS — F4310 Post-traumatic stress disorder, unspecified: Secondary | ICD-10-CM | POA: Diagnosis not present

## 2016-04-27 DIAGNOSIS — Z888 Allergy status to other drugs, medicaments and biological substances status: Secondary | ICD-10-CM | POA: Insufficient documentation

## 2016-04-27 DIAGNOSIS — R112 Nausea with vomiting, unspecified: Secondary | ICD-10-CM | POA: Diagnosis not present

## 2016-04-27 DIAGNOSIS — K529 Noninfective gastroenteritis and colitis, unspecified: Secondary | ICD-10-CM | POA: Insufficient documentation

## 2016-04-27 DIAGNOSIS — R110 Nausea: Secondary | ICD-10-CM | POA: Insufficient documentation

## 2016-04-27 DIAGNOSIS — M7989 Other specified soft tissue disorders: Secondary | ICD-10-CM | POA: Insufficient documentation

## 2016-04-27 HISTORY — PX: ESOPHAGOGASTRODUODENOSCOPY (EGD) WITH PROPOFOL: SHX5813

## 2016-04-27 HISTORY — PX: BOTOX INJECTION: SHX5754

## 2016-04-27 SURGERY — ESOPHAGOGASTRODUODENOSCOPY (EGD) WITH PROPOFOL
Anesthesia: Monitor Anesthesia Care

## 2016-04-27 MED ORDER — SODIUM CHLORIDE 0.9 % IJ SOLN
INTRAMUSCULAR | Status: DC | PRN
  Administered 2016-04-27: 13:00:00 via SUBMUCOSAL

## 2016-04-27 MED ORDER — PROPOFOL 10 MG/ML IV BOLUS
INTRAVENOUS | Status: AC
Start: 2016-04-27 — End: 2016-04-27
  Filled 2016-04-27: qty 20

## 2016-04-27 MED ORDER — PROPOFOL 10 MG/ML IV BOLUS
INTRAVENOUS | Status: DC | PRN
  Administered 2016-04-27 (×7): 40 mg via INTRAVENOUS

## 2016-04-27 MED ORDER — SODIUM CHLORIDE 0.9 % IV SOLN
INTRAVENOUS | Status: DC
Start: 2016-04-27 — End: 2016-04-27

## 2016-04-27 MED ORDER — LACTATED RINGERS IV SOLN
INTRAVENOUS | Status: DC
Start: 2016-04-27 — End: 2016-04-27

## 2016-04-27 MED ORDER — MIDAZOLAM HCL 2 MG/2ML IJ SOLN
INTRAMUSCULAR | Status: DC | PRN
  Administered 2016-04-27 (×2): 1 mg via INTRAVENOUS

## 2016-04-27 MED ORDER — LIDOCAINE 2% (20 MG/ML) 5 ML SYRINGE
INTRAMUSCULAR | Status: DC | PRN
  Administered 2016-04-27 (×4): 20 mg via INTRAVENOUS

## 2016-04-27 MED ORDER — PROPOFOL 10 MG/ML IV BOLUS
INTRAVENOUS | Status: AC
  Filled 2016-04-27: qty 20

## 2016-04-27 MED ORDER — ONABOTULINUMTOXINA 100 UNITS IJ SOLR
INTRAMUSCULAR | Status: AC
  Filled 2016-04-27: qty 100

## 2016-04-27 MED ORDER — PROMETHAZINE HCL 25 MG/ML IJ SOLN: 6.2500 mg | INTRAMUSCULAR | Status: DC | PRN

## 2016-04-27 MED ORDER — LACTATED RINGERS IV SOLN
INTRAVENOUS | Status: DC
  Administered 2016-04-27: 12:00:00 via INTRAVENOUS
  Administered 2016-04-27: 1000 mL via INTRAVENOUS

## 2016-04-27 MED ORDER — MIDAZOLAM HCL 2 MG/2ML IJ SOLN
INTRAMUSCULAR | Status: AC
  Filled 2016-04-27: qty 2

## 2016-04-27 SURGICAL SUPPLY — 14 items

## 2016-04-27 NOTE — Discharge Instructions (Addendum)

## 2016-04-27 NOTE — Transfer of Care (Signed)
Immediate Anesthesia Transfer of Care Note  Patient: Valerie [name]  Procedure(s) Performed: Procedure(s): ESOPHAGOGASTRODUODENOSCOPY (EGD) WITH PROPOFOL (N/A) BOTOX INJECTION (N/A)  Patient Location: PACU  Anesthesia Type:MAC  Level of Consciousness: awake and alert   Airway & Oxygen Therapy: Patient Spontanous Breathing and Patient connected to nasal cannula oxygen  Post-op Assessment: Report given to RN and Post -op Vital signs reviewed and stable  Post vital signs: Reviewed and stable  Last Vitals:  Vitals:   04/27/16 1117 04/27/16 1328  BP: (!) 146/80 (!) 100/53  Pulse: 66 62  Resp: 12 13  Temp: 36.7 C 36.5 C    Last Pain:  Vitals:   04/27/16 1328  TempSrc: Oral         Complications: No apparent anesthesia complications

## 2016-04-27 NOTE — Anesthesia Preprocedure Evaluation (Signed)
Anesthesia Evaluation  Patient identified by MRN, date of birth, ID band Patient awake    Reviewed: Allergy & Precautions, NPO status , Patient's Chart, lab work & pertinent test results  Airway Mallampati: II  TM Distance: >3 FB Neck ROM: Full    Dental no notable dental hx.    Pulmonary asthma ,    Pulmonary exam normal breath sounds clear to auscultation       Cardiovascular hypertension, Normal cardiovascular exam Rhythm:Regular Rate:Normal     Neuro/Psych negative neurological ROS  negative psych ROS   GI/Hepatic negative GI ROS, Neg liver ROS,   Endo/Other  Morbid obesity  Renal/GU negative Renal ROS  negative genitourinary   Musculoskeletal  (+) Arthritis , steroids,    Abdominal   Peds negative pediatric ROS (+)  Hematology negative hematology ROS (+)   Anesthesia Other Findings   Reproductive/Obstetrics negative OB ROS                             Anesthesia Physical Anesthesia Plan  ASA: III  Anesthesia Plan: MAC   Post-op Pain Management:    Induction: Intravenous  Airway Management Planned: Nasal Cannula  Additional Equipment:   Intra-op Plan:   Post-operative Plan:   Informed Consent: I have reviewed the patients History and Physical, chart, labs and discussed the procedure including the risks, benefits and alternatives for the proposed anesthesia with the patient or authorized representative who has indicated his/her understanding and acceptance.   Dental advisory given  Plan Discussed with: CRNA and Surgeon  Anesthesia Plan Comments:         Anesthesia Quick Evaluation

## 2016-04-27 NOTE — Interval H&P Note (Signed)
History and Physical Interval Note:  04/27/2016 12:56 PM  Valerie [name]  has presented today for surgery, with the diagnosis of gastroparesis  The various methods of treatment have been discussed with the patient and family. After consideration of risks, benefits and other options for treatment, the patient has consented to  Procedure(s): ESOPHAGOGASTRODUODENOSCOPY (EGD) WITH PROPOFOL (N/A) BOTOX INJECTION (N/A) as a surgical intervention .  The patient's history has been reviewed, patient examined, no change in status, stable for surgery.  I have reviewed the patient's chart and labs.  Questions were answered to the patient's satisfaction.     Zeppelin Commisso C

## 2016-04-27 NOTE — Op Note (Signed)
[facility] Patient Name: Valerie [name] Procedure Date: 04/27/2016 MRN: [id number] Attending MD: Missy Sabins , MD Date of Birth: 1960-08-14 CSN: [id number] Age: 55 Admit Type: Outpatient Procedure:                Upper GI endoscopy Indications:              For therapy of gastroparesis Providers:                Zyla Dascenzo C. Amedeo Plenty, MD, Laverta Baltimore RN, RN, Elspeth Cho Tech., Technician, Glenis Smoker, CRNA Referring MD:              Medicines:                Propofol per Anesthesia Complications:            No immediate complications. Estimated Blood Loss:     Estimated blood loss: none. Procedure:                Pre-Anesthesia Assessment:                           - Prior to the procedure, a History and Physical                            was performed, and patient medications and                            allergies were reviewed. The patient's tolerance of                            previous anesthesia was also reviewed. The risks                            and benefits of the procedure and the sedation                            options and risks were discussed with the patient.                            All questions were answered, and informed consent                            was obtained. Prior Anticoagulants: The patient has                            taken no previous anticoagulant or antiplatelet                            agents. ASA Grade Assessment: III - A patient with                            severe systemic disease. After reviewing the risks  and benefits, the patient was deemed in                            satisfactory condition to undergo the procedure.                           After obtaining informed consent, the endoscope was                            passed under direct vision. Throughout the                            procedure, the patient's blood pressure, pulse, and                   oxygen saturations were monitored continuously. The                            [id number]) scope was introduced through the                            mouth, and advanced to the second part of duodenum.                            The upper GI endoscopy was accomplished without                            difficulty. The patient tolerated the procedure                            well. Findings:      The examined esophagus was normal.      The entire examined stomach was normal. Area was successfully injected       with 100 units botulinum toxin for muscle relaxation.      The examined duodenum was normal. Impression:               - Normal esophagus.                           - Normal stomach. Injected.                           - Normal examined duodenum.                           - No specimens collected. Moderate Sedation:      N/A- Per Anesthesia Care Recommendation:           - Gastroparesis diet.                           - Continue present medications.                           - Patient has a contact number available for  emergencies. The signs and symptoms of potential                            delayed complications were discussed with the                            patient. Return to normal activities tomorrow.                            Written discharge instructions were provided to the                            patient.                           - Return to my office PRN. Procedure Code(s):        --- Professional ---                           [phone], Esophagogastroduodenoscopy, flexible,                            transoral; with directed submucosal injection(s),                            any substance Diagnosis Code(s):        --- Professional ---                           K31.84, Gastroparesis CPT copyright 2016 [employer]. All rights reserved. The codes documented in this report are preliminary and  upon coder review may  be revised to meet current compliance requirements. Missy Sabins, MD 04/27/2016 1:26:29 PM This report has been signed electronically. Number of Addenda: 0

## 2016-04-27 NOTE — H&P (View-Only) (Signed)
Subjective:  By signing my name below, I, [name], attest that this documentation has been prepared under the direction and in the presence of Delman Cheadle, MD.  Electronically Signed: Thea Alken, ED Scribe. 04/15/2016. 12:14 PM.   Patient ID: Valerie [name], female    DOB: 1960/10/19, 55 y.o.   MRN: [id number]  HPI   Chief Complaint  Patient presents with  . Follow-up   HPI Comments: Valerie [name] is a 55 y.o. female who presents to the [facility] for a follow up. Pt's symptoms are unchanged since last visit. She reports not feeling well, but is unable to pinpoint symptoms. She stills has some dysuria and nausea as well as new leg swelling and some facial swelling. She also notes metallic taste in her mouth and right flank discomfort has resolved.  Patient Active Problem List   Diagnosis Date Noted  . ARF (acute renal failure) ([place]) 12/28/2015  . Altered mental status 12/28/2015  . CKD (chronic kidney disease) stage 3, GFR 30-59 ml/min   . Chronic pain syndrome   . Knee pain, acute   . OSA (obstructive sleep apnea)   . Morbid obesity ([place])   . Myalgia 08/19/2014  . Neoplasm of uncertain behavior of skin 02/03/2014  . Sleep apnea with use of continuous positive airway pressure (CPAP) 04/15/2013  . Obesity, morbid ([place]) 10/21/2012  . OSA on CPAP 10/21/2012  . Fibromyalgia 06/22/2012  . Back pain 06/22/2012  . Recurrent pancreatitis ([place]) 06/22/2012  . Depression 06/22/2012  . Anemia 05/01/2012  . Colitis 04/28/2012  . Leukocytosis 04/28/2012  . Colon polyp 08/01/2011  . PTSD (post-traumatic stress disorder) 08/01/2011  . Hypertension 08/01/2011  . IBS (irritable bowel syndrome) 08/01/2011   Past Medical History:  Diagnosis Date  . Acute ischemic colitis ([place])   . Anemia 05/01/2012  . Anxiety   . ARF (acute renal failure) ([place]) 12/28/2015  . Arthritis    "basically ~ q joint" (12/28/2015)  . Asthma   . Basal cell carcinoma of upper back ~  2011  . Chronic lower back pain   . DDD (degenerative disc disease), lumbar   . Diarrhea    EPISODIC  . Fibromyalgia   . First degree heart block   . Gastro-esophageal reflux   . Headache(784.0)    "muscular w/migraine intensity; ~ 5-6 months" (12/28/2015)  . Hypertension   . Mental disorder   . Nerve pain    nerve damage to right arm  . Neuromuscular disorder ([place])   . Pancreatitis   . Pneumonia ~ 2014; 08/2015  . Polymyalgia rheumatica ([place]) onset 08/2015  . Precancerous melanosis    "2nd toe left foot; 5th toe of right foot; right temple; had them all cut off"  . Sleep apnea    CPAP-9   Past Surgical History:  Procedure Laterality Date  . BASAL CELL CARCINOMA EXCISION  ~ 2011   back  . BOTOX INJECTION N/A 11/06/2012   Procedure: BOTOX INJECTION;  Surgeon: Missy Sabins, MD;  Location: [facility];  Service: Endoscopy;  Laterality: N/A;  . BOTOX INJECTION N/A 12/04/2013   Procedure: BOTOX INJECTION;  Surgeon: Missy Sabins, MD;  Location: [place];  Service: Endoscopy;  Laterality: N/A;  . BOTOX INJECTION N/A 02/26/2014   Procedure: BOTOX INJECTION;  Surgeon: Missy Sabins, MD;  Location: [place];  Service: Endoscopy;  Laterality: N/A;  . BOTOX INJECTION N/A 07/08/2015   Procedure: BOTOX INJECTION;  Surgeon: Teena Irani, MD;  Location: [facility];  Service: Endoscopy;  Laterality: N/A;  . ESOPHAGOGASTRODUODENOSCOPY  12/21/2011   Procedure: ESOPHAGOGASTRODUODENOSCOPY (EGD);  Surgeon: Missy Sabins, MD;  Location: [facility] ENDOSCOPY;  Service: Endoscopy;  Laterality: N/A;  . ESOPHAGOGASTRODUODENOSCOPY N/A 11/06/2012   Procedure: ESOPHAGOGASTRODUODENOSCOPY (EGD);  Surgeon: Missy Sabins, MD;  Location: [facility];  Service: Endoscopy;  Laterality: N/A;  . ESOPHAGOGASTRODUODENOSCOPY N/A 12/04/2013   Procedure: ESOPHAGOGASTRODUODENOSCOPY (EGD);  Surgeon: Missy Sabins, MD;  Location: [facility] ENDOSCOPY;  Service: Endoscopy;  Laterality: N/A;  . ESOPHAGOGASTRODUODENOSCOPY N/A 02/26/2014    Procedure: ESOPHAGOGASTRODUODENOSCOPY (EGD);  Surgeon: Missy Sabins, MD;  Location: [facility] ENDOSCOPY;  Service: Endoscopy;  Laterality: N/A;  . ESOPHAGOGASTRODUODENOSCOPY (EGD) WITH PROPOFOL N/A 07/08/2015   Procedure: ESOPHAGOGASTRODUODENOSCOPY (EGD) WITH PROPOFOL;  Surgeon: Teena Irani, MD;  Location: [facility];  Service: Endoscopy;  Laterality: N/A;  . FLEXIBLE SIGMOIDOSCOPY  05/01/2012   Procedure: FLEXIBLE SIGMOIDOSCOPY;  Surgeon: Jeryl Columbia, MD;  Location: [facility];  Service: Endoscopy;  Laterality: N/A;  unprepped  . LAPAROSCOPIC CHOLECYSTECTOMY  01/2002  . NASAL SEPTOPLASTY W/ TURBINOPLASTY  1998  . SHOULDER ARTHROSCOPY WITH ROTATOR CUFF REPAIR AND OPEN BICEPS TENODESIS Bilateral 2010-2013   left-right   Allergies  Allergen Reactions  . Dilaudid [Hydromorphone Hcl] Other (See Comments)    Headache   . Pregabalin   . Metoclopramide Hcl Other (See Comments)    "crawling inside"  . Neurontin [Gabapentin] Other (See Comments)    Tremors   . Other Other (See Comments)    Avoid all SSRI's and SNRI's- damaged pancreas  . Reglan [Metoclopramide]    Prior to Admission medications   Medication Sig Start Date End Date Taking? Authorizing Provider  albuterol (PROVENTIL HFA;VENTOLIN HFA) 108 (90 BASE) MCG/ACT inhaler Inhale 2 puffs into the lungs as needed for wheezing or shortness of breath. 03/04/15  Yes [name], MD  allopurinol (ZYLOPRIM) 300 MG tablet Take 0.5 tablets (150 mg total) by mouth daily. 11/24/15  Yes [name], MD  ALPRAZolam Duanne Moron) 1 MG tablet Take 1 tablet (1 mg total) by mouth 3 (three) times daily as needed for anxiety. 02/18/16  Yes Darlyne Russian, MD  baclofen (LIORESAL) 10 MG tablet TAKE 1 TABLET (10 MG TOTAL) BY MOUTH 3 (THREE TIMES DAILY) 01/12/16  Yes Darlyne Russian, MD  benazepril (LOTENSIN) 40 MG tablet Take 1 tablet (40 mg total) by mouth daily. 02/18/16  Yes Darlyne Russian, MD  Biotin 5000 MCG CAPS Take 10,000 mcg by mouth daily.    Yes Historical  Provider, MD  cetirizine (ZYRTEC) 10 MG tablet Take 10 mg by mouth daily.   Yes Historical Provider, MD  Cholecalciferol (VITAMIN D) 2000 UNITS tablet Take 2,000 Units by mouth daily.   Yes Historical Provider, MD  clotrimazole-betamethasone (LOTRISONE) cream Apply 1 application topically 2 (two) times daily. 11/24/15  Yes [name], MD  dexlansoprazole (DEXILANT) 60 MG capsule Take 60 mg by mouth 2 (two) times daily.   Yes Historical Provider, MD  fexofenadine (ALLEGRA) 180 MG tablet Take 180 mg by mouth daily. Reported on 07/17/2015   Yes Historical Provider, MD  fluticasone (FLONASE) 50 MCG/ACT nasal spray Place 2 sprays into both nostrils daily. 01/12/16  Yes Darlyne Russian, MD  HYDROcodone-acetaminophen (NORCO) 10-325 MG per tablet Take 1-2 tablets by mouth every 6 (six) hours as needed (pain.).   Yes Historical Provider, MD  metoprolol succinate (TOPROL-XL) 100 MG 24 hr tablet Take 1 tablet (100 mg total) by mouth daily. Take with or immediately  following a meal. 07/31/15  Yes [name], MD  modafinil (PROVIGIL) 200 MG tablet Take 0.5 tablets (100 mg total) by mouth 2 (two) times daily. 12/30/15  Yes Carmen Dohmeier, MD  montelukast (SINGULAIR) 10 MG tablet TAKE 1 TABLET (10 MG TOTAL) BY MOUTH AT BEDTIME 11/24/15  Yes [name], MD  morphine (MS CONTIN) 30 MG 12 hr tablet Take 30 mg by mouth every 8 (eight) hours. Not from [facility] 09/23/12  Yes Historical Provider, MD  Multiple Vitamins-Minerals (MULTIVITAMIN PO) Take 1 tablet by mouth daily.   Yes Historical Provider, MD  Olopatadine HCl (PATADAY) 0.2 % SOLN APPLY 1 DROP TO EYE 2 (TWO) TIMES DAILY. 07/05/15  Yes [name], MD  Pancrelipase, Lip-Prot-Amyl, (CREON) 24000 UNITS CPEP Take 48,000 Units by mouth 4 (four) times daily as needed (for pancreatitis).   Yes Historical Provider, MD  predniSONE (DELTASONE) 5 MG tablet Take 1 tablet every other day 02/18/16  Yes Darlyne Russian, MD  promethazine (PHENERGAN) 25 MG tablet Take 1 tablet  (25 mg total) by mouth every 6 (six) hours as needed for nausea or vomiting. 12/27/14  Yes [name], MD  spironolactone (ALDACTONE) 100 MG tablet Take 1 tablet (100 mg total) by mouth daily. 11/24/15  Yes [name], MD  sucralfate (CARAFATE) 1 GM/10ML suspension Take 10 mLs (1 g total) by mouth 4 (four) times daily -  with meals and at bedtime. 05/01/15  Yes [name], MD   Social History   Social History  . Marital status: Single    Spouse name: N/A  . Number of children: 0  . Years of education: College   Occupational History  . disabled    Social History Main Topics  . Smoking status: Never Smoker  . Smokeless tobacco: Never Used  . Alcohol use No  . Drug use: No  . Sexual activity: Not Currently   Other Topics Concern  . Not on file   Social History Narrative   Caffeine 2 cups iced tea daily avg.   Review of Systems  Constitutional: Negative for chills and fever.  Cardiovascular: Positive for leg swelling.  Gastrointestinal: Positive for nausea.  Genitourinary: Positive for dysuria. Negative for flank pain.       Objective:   Physical Exam  Constitutional: She is oriented to person, place, and time. She appears well-developed and well-nourished. No distress.  Eyes: Conjunctivae are normal.  Neck: Neck supple.  Cardiovascular: Normal rate, regular rhythm and normal heart sounds.   No murmur heard. Pulmonary/Chest: Effort normal and breath sounds normal. She has no wheezes. She has no rales.  Neurological: She is alert and oriented to person, place, and time.  Skin: Skin is warm and dry.  Psychiatric: She has a normal mood and affect. Her behavior is normal.    Vitals:   04/15/16 1119  BP: (!) 134/94  Pulse: 67  Resp: 18  Temp: 98.3 F (36.8 C)  TempSrc: Oral  SpO2: 97%  Weight: 287 lb 12.8 oz (130.5 kg)  Height: 5\' 5"  (1.651 m)    Results for orders placed or performed in visit on 04/15/16  POCT urinalysis dipstick  Result Value Ref  Range   Color, UA yellow yellow   Clarity, UA clear clear   Glucose, UA negative negative   Bilirubin, UA negative negative   Ketones, POC UA negative negative   Spec Grav, UA <=1.005    Blood, UA negative negative   pH, UA 5.5    Protein Ur, POC negative  negative   Urobilinogen, UA 0.2    Nitrite, UA Negative Negative   Leukocytes, UA Negative Negative  POCT Microscopic Urinalysis ([facility])  Result Value Ref Range   WBC,UR,HPF,POC Moderate (A) None WBC/hpf   RBC,UR,HPF,POC None None RBC/hpf   Bacteria Few (A) None, Too numerous to count   Mucus Absent Absent   Epithelial Cells, UR Per Microscopy None None, Too numerous to count cells/hpf   Assessment & Plan:   1. Acute pyelonephritis - 3rd dose of rocephin 1g IM qd given today. Pt starting to feel better.  I think it would be fine to complete the antibiotic course now but sent in course of Keflex in case pt feels sxs recur tomorrow.   H/o gout - refilled allopurinol - need to discuss at f/u and check uric acid with next labs.  Vag moniliasis - from abx. Diflucan per pt request.  Orders Placed This Encounter  Procedures  . POCT urinalysis dipstick  . POCT Microscopic Urinalysis ([facility])    Meds ordered this encounter  Medications  . cefTRIAXone (ROCEPHIN) injection 1 g  . cephALEXin (KEFLEX) 500 MG capsule    Sig: Take 1 capsule (500 mg total) by mouth 2 (two) times daily.    Dispense:  14 capsule    Refill:  0  . fluconazole (DIFLUCAN) 150 MG tablet    Sig: Take 1 tablet (150 mg total) by mouth once. Repeat if needed after 3 days    Dispense:  2 tablet    Refill:  0    I personally performed the services described in this documentation, which was scribed in my presence. The recorded information has been reviewed and considered, and addended by me as needed.   Delman Cheadle, M.D.  [facility] [address] [place], [place] [address] [phone] phone [phone] fax  04/17/16 10:25  AM

## 2016-04-28 ENCOUNTER — Encounter (HOSPITAL_COMMUNITY): Payer: Self-pay | Admitting: Gastroenterology

## 2016-04-29 NOTE — Anesthesia Postprocedure Evaluation (Signed)
Anesthesia Post Note  Patient: Valerie [name]  Procedure(s) Performed: Procedure(s) (LRB): ESOPHAGOGASTRODUODENOSCOPY (EGD) WITH PROPOFOL (N/A) BOTOX INJECTION (N/A)  Patient location during evaluation: PACU Anesthesia Type: MAC Level of consciousness: awake and alert Pain management: pain level controlled Vital Signs Assessment: post-procedure vital signs reviewed and stable Respiratory status: spontaneous breathing, nonlabored ventilation, respiratory function stable and patient connected to nasal cannula oxygen Cardiovascular status: stable and blood pressure returned to baseline Anesthetic complications: no    Last Vitals:  Vitals:   04/27/16 1340 04/27/16 1350  BP: 122/73 123/72  Pulse: 68 (!) 59  Resp: 17 17  Temp:      Last Pain:  Vitals:   04/27/16 1328  TempSrc: Oral                 Sayge Brienza S

## 2016-05-14 NOTE — Progress Notes (Signed)
Subjective:    Patient ID: Valerie [name], female    DOB: August 11, 1960, 55 y.o.   MRN: [id number] Chief Complaint  Patient presents with  . Flank Pain    RIGHT with burning upon unirnation x 1 week  . Urinary Frequency    HPI  1 week of right flank pain and then over the past several days noted a metallic taste in her mouth which she only had once prior when she had pyelonephrotisi. No h/o kidney stones. No h/o kidney problems until this past year in July. Mild nausea, no vomiting. Weird tongue sensation. A little more tired. + dysuria, urinary frequency. Is drinking a lot but no overding. Urgency and hesitancy.  Interstitial cystitis, bowels regular  Has gastroparesis of unknown etiology starting in 2004 and then recurrent in 2012 and steady since3. Sees Dr. Amedeo Plenty at [place] tolerate Reglan, and so done botox inj over the last 3 years annually. Had a mild pancreatitis flair in July but otherwise not  Has an "autoimmune" thing that started in March - is seeing nephrologist - Dr. Pearson Grippe and seeing Dr. Mertie Clause at [facility] as debating what is going on as she has had multiple conflicting diagnosis.   Depression screen [facility] 2/9 04/15/2016 04/14/2016 04/13/2016 02/18/2016 01/21/2016  Decreased Interest 0 0 0 0 0  Down, Depressed, Hopeless 0 0 0 0 0  PHQ - 2 Score 0 0 0 0 0  Altered sleeping - - - - -  Tired, decreased energy - - - - -  Change in appetite - - - - -  Feeling bad or failure about yourself  - - - - -  Trouble concentrating - - - - -  Moving slowly or fidgety/restless - - - - -  Suicidal thoughts - - - - -  PHQ-9 Score - - - - -        Past Medical History:  Diagnosis Date  . Acute ischemic colitis ([place])   . Anemia 05/01/2012  . Anxiety   . ARF (acute renal failure) ([place]) 12/28/2015  . Arthritis    "basically ~ q joint" (12/28/2015)  . Asthma   . Basal cell carcinoma of upper back ~ 2011  . Chronic lower back pain   . DDD (degenerative disc disease),  lumbar   . Diarrhea    EPISODIC  . Fibromyalgia   . First degree heart block   . Gastro-esophageal reflux   . Headache(784.0)    "muscular w/migraine intensity; ~ 5-6 months" (12/28/2015)  . Hypertension   . Mental disorder   . Nerve pain    nerve damage to right arm  . Neuromuscular disorder ([place])   . Pancreatitis   . Pneumonia ~ 2014; 08/2015  . Polymyalgia rheumatica ([place]) onset 08/2015  . Precancerous melanosis    "2nd toe left foot; 5th toe of right foot; right temple; had them all cut off"  . Sleep apnea    CPAP-9        Past Surgical History:  Procedure Laterality Date  . BASAL CELL CARCINOMA EXCISION  ~ 2011   back  . BOTOX INJECTION N/A 11/06/2012   Procedure: BOTOX INJECTION; Surgeon: Missy Sabins, MD; Location: [facility]; Service: Endoscopy; Laterality: N/A;  . BOTOX INJECTION N/A 12/04/2013   Procedure: BOTOX INJECTION; Surgeon: Missy Sabins, MD; Location: [facility]; Service: Endoscopy; Laterality: N/A;  . BOTOX INJECTION N/A 02/26/2014   Procedure: BOTOX INJECTION; Surgeon: Missy Sabins, MD; Location:  [facility]; Service: Endoscopy; Laterality: N/A;  . BOTOX INJECTION N/A 07/08/2015   Procedure: BOTOX INJECTION; Surgeon: Teena Irani, MD; Location: [facility]; Service: Endoscopy; Laterality: N/A;  . ESOPHAGOGASTRODUODENOSCOPY  12/21/2011   Procedure: ESOPHAGOGASTRODUODENOSCOPY (EGD); Surgeon: Missy Sabins, MD; Location: [facility] ENDOSCOPY; Service: Endoscopy; Laterality: N/A;  . ESOPHAGOGASTRODUODENOSCOPY N/A 11/06/2012   Procedure: ESOPHAGOGASTRODUODENOSCOPY (EGD); Surgeon: Missy Sabins, MD; Location: [facility]; Service: Endoscopy; Laterality: N/A;  . ESOPHAGOGASTRODUODENOSCOPY N/A 12/04/2013   Procedure: ESOPHAGOGASTRODUODENOSCOPY (EGD); Surgeon: Missy Sabins, MD; Location: [facility] ENDOSCOPY; Service: Endoscopy; Laterality: N/A;  . ESOPHAGOGASTRODUODENOSCOPY N/A 02/26/2014   Procedure: ESOPHAGOGASTRODUODENOSCOPY (EGD); Surgeon: Missy Sabins, MD; Location: [facility] ENDOSCOPY;  Service: Endoscopy; Laterality: N/A;  . ESOPHAGOGASTRODUODENOSCOPY (EGD) WITH PROPOFOL N/A 07/08/2015   Procedure: ESOPHAGOGASTRODUODENOSCOPY (EGD) WITH PROPOFOL; Surgeon: Teena Irani, MD; Location: [facility]; Service: Endoscopy; Laterality: N/A;  . FLEXIBLE SIGMOIDOSCOPY  05/01/2012   Procedure: FLEXIBLE SIGMOIDOSCOPY; Surgeon: Jeryl Columbia, MD; Location: [facility]; Service: Endoscopy; Laterality: N/A; unprepped  . LAPAROSCOPIC CHOLECYSTECTOMY  01/2002  . NASAL SEPTOPLASTY W/ TURBINOPLASTY  1998  . SHOULDER ARTHROSCOPY WITH ROTATOR CUFF REPAIR AND OPEN BICEPS TENODESIS Bilateral 2010-2013   left-right         Current Outpatient Prescriptions on File Prior to Visit  Medication Sig Dispense Refill  . albuterol (PROVENTIL HFA;VENTOLIN HFA) 108 (90 BASE) MCG/ACT inhaler Inhale 2 puffs into the lungs as needed for wheezing or shortness of breath. 1 Inhaler 3  . allopurinol (ZYLOPRIM) 300 MG tablet Take 0.5 tablets (150 mg total) by mouth daily. 90 tablet 1  . ALPRAZolam (XANAX) 1 MG tablet Take 1 tablet (1 mg total) by mouth 3 (three) times daily as needed for anxiety. 90 tablet 1  . baclofen (LIORESAL) 10 MG tablet TAKE 1 TABLET (10 MG TOTAL) BY MOUTH 3 (THREE TIMES DAILY) 90 tablet 2  . benazepril (LOTENSIN) 40 MG tablet Take 1 tablet (40 mg total) by mouth daily. 90 tablet 3  . Biotin 5000 MCG CAPS Take 10,000 mcg by mouth daily.     . cetirizine (ZYRTEC) 10 MG tablet Take 10 mg by mouth daily.    . Cholecalciferol (VITAMIN D) 2000 UNITS tablet Take 2,000 Units by mouth daily.    . clotrimazole-betamethasone (LOTRISONE) cream Apply 1 application topically 2 (two) times daily. 30 g 0  . dexlansoprazole (DEXILANT) 60 MG capsule Take 60 mg by mouth 2 (two) times daily.    . fexofenadine (ALLEGRA) 180 MG tablet Take 180 mg by mouth daily. Reported on 07/17/2015    . fluticasone (FLONASE) 50 MCG/ACT nasal spray Place 2 sprays into both nostrils daily. 48 g 2  . HYDROcodone-acetaminophen (NORCO)  10-325 MG per tablet Take 1-2 tablets by mouth every 6 (six) hours as needed (pain.).    Marland Kitchen metoprolol succinate (TOPROL-XL) 100 MG 24 hr tablet Take 1 tablet (100 mg total) by mouth daily. Take with or immediately following a meal. 90 tablet 3  . modafinil (PROVIGIL) 200 MG tablet Take 0.5 tablets (100 mg total) by mouth 2 (two) times daily. 90 tablet 1  . montelukast (SINGULAIR) 10 MG tablet TAKE 1 TABLET (10 MG TOTAL) BY MOUTH AT BEDTIME 90 tablet 3  . Olopatadine HCl (PATADAY) 0.2 % SOLN APPLY 1 DROP TO EYE 2 (TWO) TIMES DAILY. 2.5 mL 8  . Pancrelipase, Lip-Prot-Amyl, (CREON) 24000 UNITS CPEP Take 48,000 Units by mouth 4 (four) times daily as needed (for pancreatitis).    . predniSONE (DELTASONE) 5 MG tablet Take 1 tablet every other day 14  tablet 0  . promethazine (PHENERGAN) 25 MG tablet Take 1 tablet (25 mg total) by mouth every 6 (six) hours as needed for nausea or vomiting. 30 tablet 1  . spironolactone (ALDACTONE) 100 MG tablet Take 1 tablet (100 mg total) by mouth daily. 90 tablet 3  . sucralfate (CARAFATE) 1 GM/10ML suspension Take 10 mLs (1 g total) by mouth 4 (four) times daily - with meals and at bedtime. 420 mL 0  . amLODipine (NORVASC) 2.5 MG tablet Take 1 tablet (2.5 mg total) by mouth daily. (Patient not taking: Reported on 04/13/2016) 30 tablet 3  . morphine (MS CONTIN) 30 MG 12 hr tablet Take 30 mg by mouth every 8 (eight) hours. Not from [place]    . morphine (MS CONTIN) 60 MG 12 hr tablet     . Multiple Vitamins-Minerals (MULTIVITAMIN PO) Take 1 tablet by mouth daily.     No current facility-administered medications on file prior to visit.         Allergies  Allergen Reactions  . Dilaudid [Hydromorphone Hcl] Other (See Comments)    Headache   . Pregabalin   . Metoclopramide Hcl Other (See Comments)    "crawling inside"  . Neurontin [Gabapentin] Other (See Comments)    Tremors   . Other Other (See Comments)    Avoid all SSRI's and SNRI's- damaged pancreas  . Reglan  [Metoclopramide]         Family History  Problem Relation Age of Onset  . Heart disease Mother   . COPD Mother   . Arthritis Mother   . Cancer Mother   . Hypertension Mother   . Arthritis Paternal Grandmother   . Heart disease Paternal Grandmother   . Heart disease Paternal Grandfather    Social History        Social History  . Marital status: Single    Spouse name: N/A  . Number of children: 0  . Years of education: College       Occupational History  . disabled        Social History Main Topics  . Smoking status: Never Smoker  . Smokeless tobacco: Never Used  . Alcohol use No  . Drug use: No  . Sexual activity: Not Currently       Other Topics Concern  . None      Social History Narrative   Caffeine 2 cups iced tea daily avg.      Review of Systems See hpi    Objective:   Physical Exam  Constitutional: She is oriented to person, place, and time. She appears well-developed and well-nourished. No distress.  HENT:  Head: Normocephalic and atraumatic.  Right Ear: External ear normal.  Left Ear: External ear normal.  Eyes: Conjunctivae are normal. No scleral icterus.  Neck: Normal range of motion. Neck supple. No thyromegaly present.  Cardiovascular: Normal rate, regular rhythm, normal heart sounds and intact distal pulses.   Pulmonary/Chest: Effort normal and breath sounds normal. No respiratory distress.  Abdominal: Normal appearance and bowel sounds are normal. There is no hepatosplenomegaly. There is no tenderness. There is no CVA tenderness.  Musculoskeletal: She exhibits no edema.  Lymphadenopathy:    She has no cervical adenopathy.  Neurological: She is alert and oriented to person, place, and time.  Skin: Skin is warm and dry. She is not diaphoretic. No erythema.  Psychiatric: She has a normal mood and affect. Her behavior is normal.    BP 104/84 (BP Location: Left Wrist, Patient Position:  Sitting, Cuff Size: Normal)   Pulse 71   Temp 97.7  F (36.5 C) (Oral)   Resp 16   Ht 5\' 5"  (1.651 m)   Wt 284 lb 3.2 oz (128.9 kg) Comment: with shoes  SpO2 97%   BMI 47.29 kg/m      Results for orders placed or performed in visit on 04/13/16  Urine culture  Result Value Ref Range   Organism ID, Bacteria NO GROWTH   Lipase  Result Value Ref Range   Lipase 49 7 - 60 U/L  Comprehensive metabolic panel  Result Value Ref Range   Sodium 138 135 - 146 mmol/L   Potassium 4.7 3.5 - 5.3 mmol/L   Chloride 102 98 - 110 mmol/L   CO2 24 20 - 31 mmol/L   Glucose, Bld 89 65 - 99 mg/dL   BUN 26 (H) 7 - 25 mg/dL   Creat 1.24 (H) 0.50 - 1.05 mg/dL   Total Bilirubin 0.2 0.2 - 1.2 mg/dL   Alkaline Phosphatase 55 33 - 130 U/L   AST 13 10 - 35 U/L   ALT 13 6 - 29 U/L   Total Protein 8.1 6.1 - 8.1 g/dL   Albumin 4.7 3.6 - 5.1 g/dL   Calcium 10.3 8.6 - 10.4 mg/dL  POCT urinalysis dipstick  Result Value Ref Range   Color, UA yellow yellow   Clarity, UA cloudy (A) clear   Glucose, UA negative negative   Bilirubin, UA negative negative   Ketones, POC UA negative negative   Spec Grav, UA 1.015    Blood, UA trace-lysed (A) negative   pH, UA 5.5    Protein Ur, POC negative negative   Urobilinogen, UA 0.2    Nitrite, UA Negative Negative   Leukocytes, UA large (3+) (A) Negative  POCT CBC  Result Value Ref Range   WBC 10.6 (A) 4.6 - 10.2 K/uL   Lymph, poc 3.3 0.6 - 3.4   POC LYMPH PERCENT 31.3 10 - 50 %L   MID (cbc) 0.4 0 - 0.9   POC MID % 3.8 0 - 12 %M   POC Granulocyte 6.9 2 - 6.9   Granulocyte percent 64.9 37 - 80 %G   RBC 4.64 4.04 - 5.48 M/uL   Hemoglobin 14.1 12.2 - 16.2 g/dL   HCT, POC 41.2 37.7 - 47.9 %   MCV 88.6 80 - 97 fL   MCH, POC 30.3 27 - 31.2 pg   MCHC 34.2 31.8 - 35.4 g/dL   RDW, POC 14.4 %   Platelet Count, POC 280 142 - 424 K/uL   MPV 7.9 0 - 99.8 fL    Assessment & Plan:   1. Dysuria   2. Acute pyelonephritis   Pt will recheck with me tomorrow and plan for a second dose of Rocephin at that time.  Due to her  complex PMHx and current sxs, I do think it would be prudent to plan for 3d of parenteral Rocephin and then consider need for additional abx therapy depending on labs at that time.  Orders Placed This Encounter  Procedures  . Urine culture  . Lipase  . Comprehensive metabolic panel  . POCT urinalysis dipstick  . POCT CBC    Meds ordered this encounter  Medications  . cefTRIAXone (ROCEPHIN) injection 1 g    Delman Cheadle, M.D.  [facility] [address] [place], [place] [address] [phone] phone [phone] fax  05/14/16 10:23 AM

## 2016-05-16 DIAGNOSIS — G629 Polyneuropathy, unspecified: Secondary | ICD-10-CM | POA: Diagnosis not present

## 2016-05-25 ENCOUNTER — Other Ambulatory Visit: Payer: Self-pay | Admitting: Family Medicine

## 2016-05-29 DIAGNOSIS — M79605 Pain in left leg: Secondary | ICD-10-CM | POA: Diagnosis not present

## 2016-05-29 DIAGNOSIS — R768 Other specified abnormal immunological findings in serum: Secondary | ICD-10-CM | POA: Diagnosis not present

## 2016-05-29 DIAGNOSIS — M79604 Pain in right leg: Secondary | ICD-10-CM | POA: Diagnosis not present

## 2016-07-03 DIAGNOSIS — Z7952 Long term (current) use of systemic steroids: Secondary | ICD-10-CM | POA: Diagnosis not present

## 2016-07-03 DIAGNOSIS — G4733 Obstructive sleep apnea (adult) (pediatric): Secondary | ICD-10-CM | POA: Diagnosis not present

## 2016-07-03 DIAGNOSIS — M79604 Pain in right leg: Secondary | ICD-10-CM | POA: Diagnosis not present

## 2016-07-03 DIAGNOSIS — F332 Major depressive disorder, recurrent severe without psychotic features: Secondary | ICD-10-CM | POA: Diagnosis not present

## 2016-07-03 DIAGNOSIS — M79601 Pain in right arm: Secondary | ICD-10-CM | POA: Diagnosis not present

## 2016-07-03 DIAGNOSIS — M79605 Pain in left leg: Secondary | ICD-10-CM | POA: Diagnosis not present

## 2016-07-03 DIAGNOSIS — E669 Obesity, unspecified: Secondary | ICD-10-CM | POA: Diagnosis not present

## 2016-07-03 DIAGNOSIS — F4310 Post-traumatic stress disorder, unspecified: Secondary | ICD-10-CM | POA: Diagnosis not present

## 2016-07-03 DIAGNOSIS — Z79891 Long term (current) use of opiate analgesic: Secondary | ICD-10-CM | POA: Diagnosis not present

## 2016-07-03 DIAGNOSIS — F329 Major depressive disorder, single episode, unspecified: Secondary | ICD-10-CM | POA: Diagnosis not present

## 2016-07-12 DIAGNOSIS — F332 Major depressive disorder, recurrent severe without psychotic features: Secondary | ICD-10-CM | POA: Diagnosis not present

## 2016-07-22 ENCOUNTER — Other Ambulatory Visit: Payer: Self-pay | Admitting: Family Medicine

## 2016-07-30 NOTE — Progress Notes (Signed)
Subjective:    Patient ID: Valerie [name], female    DOB: 05/18/1961, 56 y.o.   MRN: [id number] Chief Complaint  Patient presents with  . Annual Exam    Follow up for kidney infection as well    HPI  Valerie [name] is a 56 yo woman who I met 3 mos prior when she came in for an acute care visit with pyelonephritis. She is here today to follow-up.  Her chronic diseases and medication list are extensive and she sees multiple specialists on a regular basis. Her last annual wellness exam was 10/29/14 She does have a living ill and a POA.  C/o nausea for the past 6 weeks.  When this began she had chest tightness/presure if the center of her chest when she woke up in the morning which has happened to her 3-4 times and relieves within an hour of waking up and once radiated through to the shoulder pain. No water brash, no reflux during the day. No other nausea/vomiting.  She tried the promethazine once which did help. And still on the dexilant twice a day. She did recently have a botox injection for her GI.  She really does nto think it is coming from her GI sxs but doesn't know what it is coming from. Denies any ties of poor sleep, unrest, anxiety, exertion, SHoB, diaphoresis, chest wall tenderness.  Occasional night sweats but never while having the chest heaviness.  Will occ become dizzy while standing but this has not occurred with the chest sxs ever.  She is experiencing some of the chest tightness while here in te office today. No  No cough thorugh did have a viral URI about 2 mos ago.  Chronic Medical Conditions: Interstitial cystitis: Gastroparesis: unknown etiology starting in 2004 and then recurrent in 2012 and steady since3. Sees Dr. Amedeo Plenty at [facility] tolerate Reglan, and so done botox inj over the last 3 years annually. Had EGD 3 months prior. Prn sucralfate and promethazine. On dexilant, not needing the creon currently. Has been very well controlled recently and asymptomatic so also  not needed the carafate. H/o pancreatitis: last flair mild 07/17 CKD stage 3: follows with [facility]. Pearson Grippe at [facility] "Autoimmune" thing: that started in March - is seeing nephrologist - Dr. Pearson Grippe and seeing Dr. Graylin Shiver at [place] as debating what is going on as she has had multiple conflicting diagnosis.  Neurology Dr. Gilford Rile at [facility] and Dr. Brett Fairy at [facility]. On prednisone 81m qd and trying to wean off OSA: On CPAP, on provigil from D. Dohmeier. Chronic fatigue:   Fibromyalgia: Now lower legs are tender which they never were prior.  Has chronic rt neck pain radiating into muscle tension HA which responded well to PT prior - would like referral back since [facility] Anxiety: Using alprazolam and her use has increased some due to recent stressors. Can usually go for several weeks inbetween needing though use has incrased to 4-5x/wk recently.  She will skip her morphine if she uses the alprazolam.  +panic attacks but + PTSD. Diffuse uric acid joint flairs: She has never had gout but she states all of her joints hurt when her uric acid level is up. THough she has another sore knot on her fingers.  HTN: On spironolactone 101mqd, benazepril 4045m is able to check at home and stays at goal Chronic pain: Needs to see PT for neck. Getting baclofen from her  pain clinic Seasonal allergies/environmental: on zyrtec daily and eyes worsening using pataday but recently stopped helping poss due to the autoimmune conditions.  Has not been seen by optho in the past year - saw at [facility] office prior b Psoriasis - flaring now thtat she is down on her prednisone. Used lotrisone prior which worked as needed. Her pain has started to decrease from the autoimmune stuff in the past few wks so is down to 53m on the prednisone.  Chronic environmental allergies and allergic conjunctivitis: Eyes driving her crazy - so gritty, itchy, watery from environmental allergies.  Has been  on pataday for a long time which initially helped but no longer.  Patient Active Problem List   Diagnosis Date Noted  . ARF (acute renal failure) ([facility] 12/28/2015  . Altered mental status 12/28/2015  . CKD (chronic kidney disease) stage 3, GFR 30-59 ml/min   . Chronic pain syndrome   . Knee pain, acute   . OSA (obstructive sleep apnea)   . Morbid obesity ([place]   . Myalgia 08/19/2014  . Neoplasm of uncertain behavior of skin 02/03/2014  . Sleep apnea with use of continuous positive airway pressure (CPAP) 04/15/2013  . Obesity, morbid ([place] 10/21/2012  . OSA on CPAP 10/21/2012  . Fibromyalgia 06/22/2012  . Back pain 06/22/2012  . Recurrent pancreatitis ([place] 06/22/2012  . Depression 06/22/2012  . Anemia 05/01/2012  . Colitis 04/28/2012  . Leukocytosis 04/28/2012  . Colon polyp 08/01/2011  . PTSD (post-traumatic stress disorder) 08/01/2011  . Hypertension 08/01/2011  . IBS (irritable bowel syndrome) 08/01/2011   Primary Preventative Screenings: Cervical Cancer: normal/negative done here on 10/29/14, HPV testing not done STI screening: Pt declines, no risk Breast Cancer: agrees to make her appt for a mammogram, overdue Colorectal Cancer: done 02/21/2016 by Dr. JTeena IraniTobacco use: NA Bone Density: none prior, not aged in Cardiac: EKG 12/30/15 Weight/blood sugar: on chronic prednisone so weight and cbgs are increasing. Last hgba1c was 4 years prior at 6.4.  Recheck today OTC/vit/supp/herbal: Ca/Vit D 5000u daily and mvi, bioptic Diet/Exercise/EtOH/substances:none, no exercise Dentist/Optho: Immunizations: tdap 2016, flu annually and done this season, has had both pneumonia vaccines  [facility] 07/31/2016 04/15/2016 04/14/2016 04/13/2016 02/18/2016  Decreased Interest 0 0 0 0 0  Down, Depressed, Hopeless 0 0 0 0 0  PHQ - 2 Score 0 0 0 0 0  Altered sleeping - - - - -  Tired, decreased energy - - - - -  Change in appetite - - - - -  Feeling bad or failure about  yourself  - - - - -  Trouble concentrating - - - - -  Moving slowly or fidgety/restless - - - - -  Suicidal thoughts - - - - -  PHQ-9 Score - - - - -   Past Medical History:  Diagnosis Date  . Acute ischemic colitis ([place]   . Anemia 05/01/2012  . Anxiety   . [place]acute renal failure) ([place] 12/28/2015  . Arthritis    "basically ~ q joint" (12/28/2015)  . Asthma   . Basal cell carcinoma of upper back ~ 2011  . Chronic lower back pain   . DDD (degenerative disc disease), lumbar   . Diarrhea    EPISODIC  . Fibromyalgia   . First degree heart block   . Gastro-esophageal reflux   . Headache(784.0)    "muscular w/migraine intensity; ~ 5-6 months" (12/28/2015)  . Hypertension   . Mental disorder   .  Nerve pain    nerve damage to right arm  . Neuromuscular disorder ([place])   . Pancreatitis   . Pneumonia ~ 2014; 08/2015  . Polymyalgia rheumatica ([place]) onset 08/2015  . Precancerous melanosis    "2nd toe left foot; 5th toe of right foot; right temple; had them all cut off"  . Sleep apnea    CPAP-9   Past Surgical History:  Procedure Laterality Date  . BASAL CELL CARCINOMA EXCISION  ~ 2011   back  . BOTOX INJECTION N/A 11/06/2012   Procedure: BOTOX INJECTION;  Surgeon: Missy Sabins, MD;  Location: [facility] END[facility]COPY;  Service: Endoscopy;  Laterality: N/A;  . BOTOX INJECTION N/A 12/04/2013   Procedure: BOTOX INJECTION;  Surgeon: Missy Sabins, MD;  Location: [place];  Service: Endoscopy;  Laterality: N/A;  . BOTOX INJECTION N/A 02/26/2014   Procedure: BOTOX INJECTION;  Surgeon: Missy Sabins, MD;  Location: [place];  Service: Endoscopy;  Laterality: N/A;  . BOTOX INJECTION N/A 07/08/2015   Procedure: BOTOX INJECTION;  Surgeon: Teena Irani, MD;  Location: [facility];  Service: Endoscopy;  Laterality: N/A;  . BOTOX INJECTION N/A 04/27/2016   Procedure: BOTOX INJECTION;  Surgeon: Teena Irani, MD;  Location: [facility];  Service: Endoscopy;  Laterality: N/A;  . ESOPHAGOGASTRODUODENOSCOPY   12/21/2011   Procedure: ESOPHAGOGASTRODUODENOSCOPY (EGD);  Surgeon: Missy Sabins, MD;  Location: [facility] ENDOSCOPY;  Service: Endoscopy;  Laterality: N/A;  . ESOPHAGOGASTRODUODENOSCOPY N/A 11/06/2012   Procedure: ESOPHAGOGASTRODUODENOSCOPY (EGD);  Surgeon: Missy Sabins, MD;  Location: [facility];  Service: Endoscopy;  Laterality: N/A;  . ESOPHAGOGASTRODUODENOSCOPY N/A 12/04/2013   Procedure: ESOPHAGOGASTRODUODENOSCOPY (EGD);  Surgeon: Missy Sabins, MD;  Location: [facility] ENDOSCOPY;  Service: Endoscopy;  Laterality: N/A;  . ESOPHAGOGASTRODUODENOSCOPY N/A 02/26/2014   Procedure: ESOPHAGOGASTRODUODENOSCOPY (EGD);  Surgeon: Missy Sabins, MD;  Location: [facility] ENDOSCOPY;  Service: Endoscopy;  Laterality: N/A;  . ESOPHAGOGASTRODUODENOSCOPY (EGD) WITH PROPOFOL N/A 07/08/2015   Procedure: ESOPHAGOGASTRODUODENOSCOPY (EGD) WITH PROPOFOL;  Surgeon: Teena Irani, MD;  [facility];  Service: Endoscopy;  Laterality: N/A;  . ESOPHAGOGASTRODUODENOSCOPY (EGD) WITH PROPOFOL N/A 04/27/2016   Procedure: ESOPHAGOGASTRODUODENOSCOPY (EGD) WITH PROPOFOL;  Surgeon: Teena Irani, MD;  [facility];  Service: Endoscopy;  Laterality: N/A;  . FLEXIBLE SIGMOIDOSCOPY  05/01/2012   Procedure: FLEXIBLE SIGMOIDOSCOPY;  Surgeon: Jeryl Columbia, MD;  Location: [facility];  Service: Endoscopy;  Laterality: N/A;  unprepped  . LAPAROSCOPIC CHOLECYSTECTOMY  01/2002  . NASAL SEPTOPLASTY W/ TURBINOPLASTY  1998  . SHOULDER ARTHROSCOPY WITH ROTATOR CUFF REPAIR AND OPEN BICEPS TENODESIS Bilateral 2010-2013   left-right   Current Outpatient Prescriptions on File Prior to Visit  Medication Sig Dispense Refill  . baclofen (LIORESAL) 10 MG tablet TAKE 1 TABLET (10 MG TOTAL) BY MOUTH 3 (THREE TIMES DAILY) (Patient taking differently: Take 10 mg by mouth 3 (three) times daily as needed for muscle spasms. ) 90 tablet 2  . benazepril (LOTENSIN) 40 MG tablet Take 1 tablet (40 mg total) by mouth daily. 90 tablet 3  . Biotin 5000 MCG CAPS Take  10,000 mcg by mouth daily.     . calcium carbonate (OSCAL) 1500 (600 Ca) MG TABS tablet Take 600 mg of elemental calcium by mouth daily with breakfast.    . cetirizine (ZYRTEC) 10 MG tablet Take 10 mg by mouth daily. Alternates between Allegra and Zyrtec as needed for allergies    . dexlansoprazole (DEXILANT) 60 MG capsule Take 60 mg by mouth 2 (two) times daily.    Marland Kitchen  fexofenadine (ALLEGRA) 180 MG tablet Take 180 mg by mouth daily as needed. Switches between Allegra and Zyrtec as needed for allergies    . HYDROcodone-acetaminophen (NORCO) 10-325 MG per tablet Take 1-2 tablets by mouth every 6 (six) hours as needed for moderate pain (pain.).     Marland Kitchen modafinil (PROVIGIL) 200 MG tablet Take 0.5 tablets (100 mg total) by mouth 2 (two) times daily. 90 tablet 1  . morphine (MS CONTIN) 30 MG 12 hr tablet Take 30 mg by mouth every 8 (eight) hours. Not from [place]    . Multiple Vitamins-Minerals (MULTIVITAMIN PO) Take 1 tablet by mouth daily.    . Pancrelipase, Lip-Prot-Amyl, (CREON) 24000 UNITS CPEP Take 72,000 Units by mouth 3 (three) times daily.     . predniSONE (DELTASONE) 1 MG tablet Take 4 mg by mouth daily with breakfast.    . PROAIR HFA 108 (90 Base) MCG/ACT inhaler INHALE 2 PUFFS INTO THE LUNGS AS NEEDED FOR WHEEZING OR SHORTNESS OF BREATH. 8.5 each 0   No current facility-administered medications on file prior to visit.    Allergies  Allergen Reactions  . Dilaudid [Hydromorphone Hcl] Other (See Comments)    Headache   . Pregabalin Other (See Comments)    Drug induced stupor  . Metoclopramide Hcl Other (See Comments)    "crawling inside"  . Neurontin [Gabapentin] Other (See Comments)    Tremors   . Other Other (See Comments)    Avoid all SSRI's and SNRI's- damaged pancreas   Family History  Problem Relation Age of Onset  . Heart disease Mother   . COPD Mother   . Arthritis Mother   . Cancer Mother   . Hypertension Mother   . Arthritis Paternal Grandmother   . Heart disease Paternal  Grandmother   . Heart disease Paternal Grandfather    Social History   Social History  . Marital status: Single    Spouse name: N/A  . Number of children: 0  . Years of education: College   Occupational History  . disabled    Social History Main Topics  . Smoking status: Never Smoker  . Smokeless tobacco: Never Used  . Alcohol use No  . Drug use: No  . Sexual activity: Not Currently   Other Topics Concern  . None   Social History Narrative   Caffeine 2 cups iced tea daily avg.    Review of Systems  Constitutional: Positive for diaphoresis and fatigue. Negative for activity change, appetite change, chills, fever and unexpected weight change.  Eyes: Positive for pain, discharge and itching. Negative for photophobia, redness and visual disturbance.  Respiratory: Positive for chest tightness and wheezing.   Cardiovascular: Negative for chest pain and palpitations.  Musculoskeletal: Positive for arthralgias, back pain, gait problem, joint swelling, myalgias, neck pain and neck stiffness.  Skin: Positive for rash.  Allergic/Immunologic: Positive for environmental allergies and immunocompromised state.  Neurological: Positive for dizziness, light-headedness and headaches. Negative for syncope.  Psychiatric/Behavioral: Positive for dysphoric mood and sleep disturbance.   See hpi    Objective:   Physical Exam  Constitutional: She is oriented to person, place, and time. She appears well-developed and well-nourished. No distress.  HENT:  Head: Normocephalic and atraumatic.  Right Ear: Tympanic membrane, external ear and ear canal normal.  Left Ear: Tympanic membrane, external ear and ear canal normal.  Nose: Nose normal. No mucosal edema or rhinorrhea.  Mouth/Throat: Uvula is midline, oropharynx is clear and moist and mucous membranes are normal. No posterior  oropharyngeal erythema.  Eyes: Conjunctivae and EOM are normal. Pupils are equal, round, and reactive to light. Right  eye exhibits no discharge. Left eye exhibits no discharge. No scleral icterus.  Neck: Normal range of motion. Neck supple. No thyromegaly present.  Cardiovascular: Normal rate, regular rhythm, normal heart sounds and intact distal pulses.   Pulmonary/Chest: Effort normal and breath sounds normal. No respiratory distress.  Chest wall ttp  Abdominal: Soft. Bowel sounds are normal. There is no tenderness.  Musculoskeletal: She exhibits no edema.  Lymphadenopathy:    She has no cervical adenopathy.  Neurological: She is alert and oriented to person, place, and time. She has normal reflexes.  Skin: Skin is warm and dry. She is not diaphoretic. No erythema.  Psychiatric: Her behavior is normal. She exhibits a depressed mood.     BP 113/78   Pulse 78   Temp 98.1 F (36.7 C) (Oral)   Resp 18   Ht _0  (1.651 m)   Wt 295 lb (133.8 kg)   SpO2 96%   BMI 49.09 kg/m  [facility] reading (PRIMARY) by  Dr. Brigitte Pulse. EKG: NSR, no acute ischemic change, Does have tiny S waves in II, III, and aVF     Assessment & Plan:  hiv - couldn't get this covered for screening and chronic fatigue  Pap due 10/2017  1. Nausea without vomiting   2. Medicare annual wellness visit, subsequent - reminded pt to make an appt with optho - call if she would like a referral  3. Routine screening for STI (sexually transmitted infection)   4. Encounter for screening mammogram for breast cancer - reminded pt to sched mam, pt agrees  5. Screening for cardiovascular, respiratory, and genitourinary diseases   6. Screening for deficiency anemia   7. Screening for thyroid disorder   8. Chronic gout without tophus, unspecified cause, unspecified site   9. Medication monitoring encounter   10. Stage 2 chronic kidney disease   11. Essential hypertension   12. Chronic fatigue   13. Screening examination for infectious disease   14. Hyperuricemia - has never had a true gout flair with one joint but all joints gets swollend and  tender when uric acid level is up so on allopurinol  15. Anxiety state   16. Environmental allergies - with allergic conjunctivitis, failed pataday (and many others though pt does not remember which). Will try pazeo (same med but stronger concentration) but if not effective or unable to get ins coverage may need to try alcaftadine 1 gtt qd, bepotastine 1 gtt bid, epinastine 1 gtt bid, azelastine 1 gtt bid, or emedastine 1 gtt qid  17. Abdominal pain, epigastric - follows closely with GI Dr. Amedeo Plenty and has seen him w/in the last mo  18. Chest pain, unspecified type - I highly suspect GI etiology of sxs as worse when reclining, only accompanying sxs is nausea, and improves after standing for an hr in the a.m. In the setting of known gastroparesis and h/o hiatal hernia but pt disagrees as these chest heaviness does not feel like any GI sxs she has had prior.  Fortunately, there are no red flags for ACS with her current sxs.  19.    Chronic right sided neck pain causing severe tension headache - responded well to PT with [name] prior so will re-refer 20.    Psoriasis - has been using lotrisone prn with moderate improvement - will try stronger top steroid w/o the antifungal - try betamethasone (Diprolene-AF)  Refilled  all meds.  Orders Placed This Encounter  Procedures  . Comprehensive metabolic panel    Order Specific Question:   Has the patient fasted?    Answer:   Yes  . TSH  . CBC  . Lipid panel    Order Specific Question:   Has the patient fasted?    Answer:   Yes  . Uric acid  . Lipase  . Amylase  . POCT urinalysis dipstick  . EKG 12-Lead    Meds ordered this encounter  Medications  . allopurinol (ZYLOPRIM) 300 MG tablet    Sig: Take 0.5 tablets (150 mg total) by mouth daily.    Dispense:  90 tablet    Refill:  1  . ALPRAZolam (XANAX) 1 MG tablet    Sig: Take 1 tablet (1 mg total) by mouth 3 (three) times daily as needed for anxiety.    Dispense:  90 tablet    Refill:  1  .  fluticasone (FLONASE) 50 MCG/ACT nasal spray    Sig: Place 2 sprays into both nostrils daily.    Dispense:  48 g    Refill:  2  . metoprolol succinate (TOPROL-XL) 100 MG 24 hr tablet    Sig: Take 1 tablet (100 mg total) by mouth daily. Take with or immediately following a meal.    Dispense:  90 tablet    Refill:  3  . montelukast (SINGULAIR) 10 MG tablet    Sig: TAKE 1 TABLET (10 MG TOTAL) BY MOUTH AT BEDTIME    Dispense:  90 tablet    Refill:  3  . spironolactone (ALDACTONE) 100 MG tablet    Sig: Take 1 tablet (100 mg total) by mouth daily.    Dispense:  90 tablet    Refill:  3  . promethazine (PHENERGAN) 25 MG tablet    Sig: Take 1 tablet (25 mg total) by mouth every 6 (six) hours as needed for nausea or vomiting.    Dispense:  30 tablet    Refill:  1  . sucralfate (CARAFATE) 1 GM/10ML suspension    Sig: Take 10 mLs (1 g total) by mouth 4 (four) times daily as needed (stomach pain).    Dispense:  420 mL    Refill:  1    Delman Cheadle, M.D.  Primary Care at [facility] [address] [place], [place] [address] [phone] phone [phone] fax  08/02/16 11:12 AM

## 2016-07-31 ENCOUNTER — Encounter: Payer: Self-pay | Admitting: Family Medicine

## 2016-07-31 ENCOUNTER — Ambulatory Visit (INDEPENDENT_AMBULATORY_CARE_PROVIDER_SITE_OTHER): Payer: Medicare Other | Admitting: Family Medicine

## 2016-07-31 VITALS — BP 113/78 | HR 78 | Temp 98.1°F | Resp 18 | Ht 65.0 in | Wt 295.0 lb

## 2016-07-31 DIAGNOSIS — Z136 Encounter for screening for cardiovascular disorders: Secondary | ICD-10-CM

## 2016-07-31 DIAGNOSIS — Z130 Encounter for screening for diseases of the blood and blood-forming organs and certain disorders involving the immune mechanism: Secondary | ICD-10-CM

## 2016-07-31 DIAGNOSIS — R1013 Epigastric pain: Secondary | ICD-10-CM | POA: Diagnosis not present

## 2016-07-31 DIAGNOSIS — Z1231 Encounter for screening mammogram for malignant neoplasm of breast: Secondary | ICD-10-CM | POA: Diagnosis not present

## 2016-07-31 DIAGNOSIS — R079 Chest pain, unspecified: Secondary | ICD-10-CM | POA: Diagnosis not present

## 2016-07-31 DIAGNOSIS — Z1389 Encounter for screening for other disorder: Secondary | ICD-10-CM | POA: Diagnosis not present

## 2016-07-31 DIAGNOSIS — Z5181 Encounter for therapeutic drug level monitoring: Secondary | ICD-10-CM

## 2016-07-31 DIAGNOSIS — I10 Essential (primary) hypertension: Secondary | ICD-10-CM

## 2016-07-31 DIAGNOSIS — R5382 Chronic fatigue, unspecified: Secondary | ICD-10-CM | POA: Diagnosis not present

## 2016-07-31 DIAGNOSIS — Z1383 Encounter for screening for respiratory disorder NEC: Secondary | ICD-10-CM

## 2016-07-31 DIAGNOSIS — R7309 Other abnormal glucose: Secondary | ICD-10-CM | POA: Diagnosis not present

## 2016-07-31 DIAGNOSIS — Z0000 Encounter for general adult medical examination without abnormal findings: Secondary | ICD-10-CM

## 2016-07-31 DIAGNOSIS — Z1329 Encounter for screening for other suspected endocrine disorder: Secondary | ICD-10-CM | POA: Diagnosis not present

## 2016-07-31 DIAGNOSIS — Z9109 Other allergy status, other than to drugs and biological substances: Secondary | ICD-10-CM

## 2016-07-31 DIAGNOSIS — E790 Hyperuricemia without signs of inflammatory arthritis and tophaceous disease: Secondary | ICD-10-CM | POA: Diagnosis not present

## 2016-07-31 DIAGNOSIS — R110 Nausea: Secondary | ICD-10-CM | POA: Diagnosis not present

## 2016-07-31 DIAGNOSIS — F411 Generalized anxiety disorder: Secondary | ICD-10-CM

## 2016-07-31 DIAGNOSIS — Z113 Encounter for screening for infections with a predominantly sexual mode of transmission: Secondary | ICD-10-CM

## 2016-07-31 DIAGNOSIS — G44229 Chronic tension-type headache, not intractable: Secondary | ICD-10-CM

## 2016-07-31 DIAGNOSIS — Z119 Encounter for screening for infectious and parasitic diseases, unspecified: Secondary | ICD-10-CM

## 2016-07-31 DIAGNOSIS — M1A9XX0 Chronic gout, unspecified, without tophus (tophi): Secondary | ICD-10-CM | POA: Diagnosis not present

## 2016-07-31 DIAGNOSIS — Z1239 Encounter for other screening for malignant neoplasm of breast: Secondary | ICD-10-CM

## 2016-07-31 DIAGNOSIS — N182 Chronic kidney disease, stage 2 (mild): Secondary | ICD-10-CM

## 2016-07-31 DIAGNOSIS — L409 Psoriasis, unspecified: Secondary | ICD-10-CM

## 2016-07-31 DIAGNOSIS — M542 Cervicalgia: Secondary | ICD-10-CM

## 2016-07-31 LAB — POCT URINALYSIS DIP (MANUAL ENTRY)
BILIRUBIN UA: NEGATIVE
Bilirubin, UA: NEGATIVE
Blood, UA: NEGATIVE
Glucose, UA: NEGATIVE
LEUKOCYTES UA: NEGATIVE
NITRITE UA: NEGATIVE
PROTEIN UA: NEGATIVE
Spec Grav, UA: 1.015
UROBILINOGEN UA: 0.2
pH, UA: 5.5

## 2016-07-31 MED ORDER — ALLOPURINOL 300 MG PO TABS: 150.0000 mg | ORAL_TABLET | Freq: Every day | ORAL | 1 refills | Status: DC

## 2016-07-31 MED ORDER — METOPROLOL SUCCINATE ER 100 MG PO TB24: 100.0000 mg | ORAL_TABLET | Freq: Every day | ORAL | 3 refills | Status: DC

## 2016-07-31 MED ORDER — MONTELUKAST SODIUM 10 MG PO TABS: ORAL_TABLET | ORAL | 3 refills | Status: DC

## 2016-07-31 MED ORDER — FLUTICASONE PROPIONATE 50 MCG/ACT NA SUSP: 2.0000 | Freq: Every day | NASAL | 2 refills | Status: DC

## 2016-07-31 MED ORDER — SUCRALFATE 1 GM/10ML PO SUSP: 1.0000 g | Freq: Four times a day (QID) | ORAL | 1 refills | Status: DC | PRN

## 2016-07-31 MED ORDER — ALPRAZOLAM 1 MG PO TABS: 1.0000 mg | ORAL_TABLET | Freq: Three times a day (TID) | ORAL | 1 refills | Status: DC | PRN

## 2016-07-31 MED ORDER — SPIRONOLACTONE 100 MG PO TABS: 100.0000 mg | ORAL_TABLET | Freq: Every day | ORAL | 3 refills | Status: DC

## 2016-07-31 MED ORDER — PROMETHAZINE HCL 25 MG PO TABS: 25.0000 mg | ORAL_TABLET | Freq: Four times a day (QID) | ORAL | 1 refills | Status: DC | PRN

## 2016-07-31 NOTE — Patient Instructions (Signed)
We recommend that you schedule a mammogram for breast cancer screening. Typically, you do not need a referral to do this. Please contact a local imaging center to schedule your mammogram.   Hospital - [phone]  *ask for the Radiology Department [facility]) - [phone] or [phone]  [facility] - [phone] [facility] - [phone] [facility] - [phone]  *ask for the Radiology Department [facility] - [phone]  *ask for the Radiology Department [facility] - [phone]  *ask for the Mammography Department [facility] - [phone]     IF you received an x-ray today, you will receive an invoice from [facility]. Please contact [facility] at [phone] with questions or concerns regarding your invoice.   IF you received labwork today, you will receive an invoice from [facility]. Please contact [facility] at [phone] with questions or concerns regarding your invoice.   Our billing staff will not be able to assist you with questions regarding bills from these companies.  You will be contacted with the lab results as soon as they are available. The fastest way to get your results is to activate your My Chart account. Instructions are located on the last page of this paperwork. If you have not heard from us regarding the results in 2 weeks, please contact this office.      

## 2016-08-01 LAB — COMPREHENSIVE METABOLIC PANEL
A/G RATIO: 1.5 (ref 1.2–2.2)
ALK PHOS: 51 IU/L (ref 39–117)
ALT: 13 IU/L (ref 0–32)
AST: 13 IU/L (ref 0–40)
Albumin: 4.3 g/dL (ref 3.5–5.5)
BUN / CREAT RATIO: 19 (ref 9–23)
BUN: 23 mg/dL (ref 6–24)
Bilirubin Total: 0.2 mg/dL (ref 0.0–1.2)
CO2: 22 mmol/L (ref 18–29)
CREATININE: 1.21 mg/dL — AB (ref 0.57–1.00)
Calcium: 9.7 mg/dL (ref 8.7–10.2)
Chloride: 99 mmol/L (ref 96–106)
GFR calc Af Amer: 59 mL/min/{1.73_m2} — ABNORMAL LOW (ref >59)
GFR calc non Af Amer: 51 mL/min/{1.73_m2} — ABNORMAL LOW (ref >59)
GLOBULIN, TOTAL: 2.8 g/dL (ref 1.5–4.5)
Glucose: 110 mg/dL — ABNORMAL HIGH (ref 65–99)
Potassium: 5.3 mmol/L — ABNORMAL HIGH (ref 3.5–5.2)
SODIUM: 137 mmol/L (ref 134–144)
Total Protein: 7.1 g/dL (ref 6.0–8.5)

## 2016-08-01 LAB — URIC ACID: URIC ACID: 6.6 mg/dL (ref 2.5–7.1)

## 2016-08-01 LAB — CBC
HEMATOCRIT: 38.9 % (ref 34.0–46.6)
Hemoglobin: 12.6 g/dL (ref 11.1–15.9)
MCH: 29.6 pg (ref 26.6–33.0)
MCHC: 32.4 g/dL (ref 31.5–35.7)
MCV: 92 fL (ref 79–97)
Platelets: 300 10*3/uL (ref 150–379)
RBC: 4.25 x10E6/uL (ref 3.77–5.28)
RDW: 14.3 % (ref 12.3–15.4)
WBC: 7.3 10*3/uL (ref 3.4–10.8)

## 2016-08-01 LAB — LIPID PANEL
CHOL/HDL RATIO: 5.1 ratio — AB (ref 0.0–4.4)
Cholesterol, Total: 188 mg/dL (ref 100–199)
HDL: 37 mg/dL — ABNORMAL LOW (ref >39)
LDL Calculated: 90 mg/dL (ref 0–99)
TRIGLYCERIDES: 305 mg/dL — AB (ref 0–149)
VLDL Cholesterol Cal: 61 mg/dL — ABNORMAL HIGH (ref 5–40)

## 2016-08-01 LAB — LIPASE: Lipase: 60 U/L (ref 14–72)

## 2016-08-01 LAB — AMYLASE: AMYLASE: 54 U/L (ref 31–124)

## 2016-08-01 LAB — TSH: TSH: 1.71 u[IU]/mL (ref 0.450–4.500)

## 2016-08-02 MED ORDER — BETAMETHASONE DIPROPIONATE AUG 0.05 % EX CREA: TOPICAL_CREAM | Freq: Two times a day (BID) | CUTANEOUS | 3 refills | Status: DC

## 2016-08-02 MED ORDER — OLOPATADINE HCL 0.7 % OP SOLN: 1.0000 [drp] | Freq: Every day | OPHTHALMIC | 11 refills | Status: DC

## 2016-08-04 LAB — HEMOGLOBIN A1C
Est. average glucose Bld gHb Est-mCnc: 131
Hgb A1c MFr Bld: 6.2 % — ABNORMAL HIGH (ref 4.8–5.6)

## 2016-08-04 LAB — SPECIMEN STATUS REPORT

## 2016-08-13 ENCOUNTER — Encounter: Payer: Self-pay | Admitting: Family Medicine

## 2016-08-17 DIAGNOSIS — M542 Cervicalgia: Secondary | ICD-10-CM | POA: Diagnosis not present

## 2016-08-22 DIAGNOSIS — M542 Cervicalgia: Secondary | ICD-10-CM | POA: Diagnosis not present

## 2016-08-28 DIAGNOSIS — M542 Cervicalgia: Secondary | ICD-10-CM | POA: Diagnosis not present

## 2016-08-29 DIAGNOSIS — M79601 Pain in right arm: Secondary | ICD-10-CM | POA: Diagnosis not present

## 2016-08-29 DIAGNOSIS — F332 Major depressive disorder, recurrent severe without psychotic features: Secondary | ICD-10-CM | POA: Diagnosis not present

## 2016-08-29 DIAGNOSIS — E669 Obesity, unspecified: Secondary | ICD-10-CM | POA: Diagnosis not present

## 2016-08-29 DIAGNOSIS — F329 Major depressive disorder, single episode, unspecified: Secondary | ICD-10-CM | POA: Diagnosis not present

## 2016-08-29 DIAGNOSIS — Z8719 Personal history of other diseases of the digestive system: Secondary | ICD-10-CM | POA: Diagnosis not present

## 2016-08-29 DIAGNOSIS — K219 Gastro-esophageal reflux disease without esophagitis: Secondary | ICD-10-CM | POA: Diagnosis not present

## 2016-08-29 DIAGNOSIS — Z79891 Long term (current) use of opiate analgesic: Secondary | ICD-10-CM | POA: Diagnosis not present

## 2016-08-29 DIAGNOSIS — G4733 Obstructive sleep apnea (adult) (pediatric): Secondary | ICD-10-CM | POA: Diagnosis not present

## 2016-08-29 DIAGNOSIS — F4310 Post-traumatic stress disorder, unspecified: Secondary | ICD-10-CM | POA: Diagnosis not present

## 2016-08-29 DIAGNOSIS — M79605 Pain in left leg: Secondary | ICD-10-CM | POA: Diagnosis not present

## 2016-08-29 DIAGNOSIS — M461 Sacroiliitis, not elsewhere classified: Secondary | ICD-10-CM | POA: Diagnosis not present

## 2016-08-29 DIAGNOSIS — M545 Low back pain: Secondary | ICD-10-CM | POA: Diagnosis not present

## 2016-08-29 DIAGNOSIS — M79604 Pain in right leg: Secondary | ICD-10-CM | POA: Diagnosis not present

## 2016-08-29 DIAGNOSIS — M542 Cervicalgia: Secondary | ICD-10-CM | POA: Diagnosis not present

## 2016-08-31 DIAGNOSIS — M542 Cervicalgia: Secondary | ICD-10-CM | POA: Diagnosis not present

## 2016-09-07 DIAGNOSIS — M542 Cervicalgia: Secondary | ICD-10-CM | POA: Diagnosis not present

## 2016-09-11 DIAGNOSIS — M542 Cervicalgia: Secondary | ICD-10-CM | POA: Diagnosis not present

## 2016-09-14 DIAGNOSIS — M542 Cervicalgia: Secondary | ICD-10-CM | POA: Diagnosis not present

## 2016-09-18 ENCOUNTER — Ambulatory Visit (INDEPENDENT_AMBULATORY_CARE_PROVIDER_SITE_OTHER): Payer: Medicare Other | Admitting: Physician Assistant

## 2016-09-18 VITALS — BP 110/60 | HR 86 | Temp 98.1°F | Resp 16 | Ht 64.5 in | Wt 293.0 lb

## 2016-09-18 DIAGNOSIS — R110 Nausea: Secondary | ICD-10-CM | POA: Diagnosis not present

## 2016-09-18 DIAGNOSIS — K3184 Gastroparesis: Secondary | ICD-10-CM | POA: Insufficient documentation

## 2016-09-18 DIAGNOSIS — R109 Unspecified abdominal pain: Secondary | ICD-10-CM

## 2016-09-18 LAB — POCT URINALYSIS DIP (MANUAL ENTRY)
Bilirubin, UA: NEGATIVE
Blood, UA: NEGATIVE
Glucose, UA: NEGATIVE
Ketones, POC UA: NEGATIVE
LEUKOCYTES UA: NEGATIVE
Nitrite, UA: NEGATIVE
PH UA: 5 (ref 5.0–8.0)
PROTEIN UA: NEGATIVE
SPEC GRAV UA: 1.005 (ref 1.030–1.035)
UROBILINOGEN UA: 0.2 (ref ?–2.0)

## 2016-09-18 LAB — POC MICROSCOPIC URINALYSIS (UMFC): Mucus: ABSENT

## 2016-09-18 NOTE — Progress Notes (Signed)
THIS NOTE IS USED FOR EDUCATIONAL PURPOSES ONLY!!!   Name: Valerie [name]  DOB: March 03, 1961  Age: 56 y.o. Sex: female  CC:  Chief Complaint  Patient presents with  . Flank Pain    PCP: No PCP Per Patient  HPI: 56 yo female with a PMH of AKI (July 2017), CKD stage 3, gastroparesis, fibromyalgia, PTSD, HTN, recurrent pancreatitis, and OSA on CPAP. Patient reports flank pain x4 days, bladder discomfort, chronic nausea, and "not feeling well."   Patient reports a history of AKI in July of 2017. She reports that this does not feel like the same as that. Patient reports she has had associated fatigue with the recent back pain. Patient d/c her benazapril and spironolactone on Friday (09/15/16) and Saturday (09/16/16) was told by Dr. Joelyn Oms (urologist) and Dr. Brigitte Pulse to d/c the medications. Patient reports she has been trying to take in more water and Gatorade over the past several days. Patient has taken her Baclofen for this issue without any symptom relief.   Patient reports the flank pain comes and goes,. Patient reports the pain is dull. Patient reports most of her pain is under her rib cage on the right.   Patient reports a previous history of bladder infection. Most recent was over a year ago and feels like this is not the same. Patient reports she has gained 3 pounds I the last 4 days per her home scale.   Patient reports a previous history of this incident happened back in November of 2017. She reported that Dr. Brigitte Pulse was having her come in for three days strait for IVF.   Patient reports her urine on Thursday/Friday (09/14/16 and 09/15/16) was darker than normal. She reports it has since gotten back to normal. Denies urgency, frequency, dysuria, hematuria.   She reports nausea associated has been going on since mid-January of 2018. No vomiting. Patient reports she has Phenergan at home in which she uses for breakthrough. She reports Phenergan helps her symptoms.  Patient reports she see's GI yearly  for a Botox injection at the pylorus for her gastroparesis. She reports that she most recently received her injection in November of 2017.   ROS:  Constitutional: Negative for activity change, appetite change, and unexpected weight change.  HENT: Negative for congestion, dental problem, ear pain, hearing loss, mouth sores, postnasal drip, rhinorrhea, sneezing, sore throat, tinnitus and trouble swallowing.   Eyes: Negative for photophobia, pain, redness and visual disturbance.  Respiratory: Negative for cough, chest tightness and shortness of breath.   Cardiovascular: Negative for chest pain, palpitations and leg swelling.  Gastrointestinal: Negative for abdominal pain, blood in stool, constipation, diarrhea, nausea and vomiting.  Genitourinary: Negative for dysuria, frequency, hematuria and urgency.  Musculoskeletal: Negative for arthralgias, gait problem, myalgias and neck stiffness.  Skin: Negative for rash.  Neurological: Negative for dizziness, speech difficulty, weakness, light-headedness, numbness and headaches.  Hematological: Negative for adenopathy.  Psychiatric/Behavioral: Negative for confusion and sleep disturbance. The patient is not nervous/anxious.    PMH:  Patient Active Problem List   Diagnosis Date Noted  . ARF (acute renal failure) ([place]) 12/28/2015  . Altered mental status 12/28/2015  . CKD (chronic kidney disease) stage 3, GFR 30-59 ml/min   . Chronic pain syndrome   . Knee pain, acute   . OSA (obstructive sleep apnea)   . Morbid obesity ([place])   . Myalgia 08/19/2014  . Neoplasm of uncertain behavior of skin 02/03/2014  . Sleep apnea with use of continuous positive airway  pressure (CPAP) 04/15/2013  . Obesity, morbid ([place]) 10/21/2012  . OSA on CPAP 10/21/2012  . Fibromyalgia 06/22/2012  . Back pain 06/22/2012  . Recurrent pancreatitis ([place]) 06/22/2012  . Depression 06/22/2012  . Anemia 05/01/2012  . Colitis 04/28/2012  . Leukocytosis 04/28/2012  . Colon polyp  08/01/2011  . PTSD (post-traumatic stress disorder) 08/01/2011  . Hypertension 08/01/2011  . IBS (irritable bowel syndrome) 08/01/2011    Allergies:  Allergies  Allergen Reactions  . Dilaudid [Hydromorphone Hcl] Other (See Comments)    Headache   . Pregabalin Other (See Comments)    Drug induced stupor  . Metoclopramide Hcl Other (See Comments)    "crawling inside"  . Neurontin [Gabapentin] Other (See Comments)    Tremors   . Other Other (See Comments)    Avoid all SSRI's and SNRI's- damaged pancreas    Medications:  Current Outpatient Prescriptions on File Prior to Visit  Medication Sig Dispense Refill  . allopurinol (ZYLOPRIM) 300 MG tablet Take 0.5 tablets (150 mg total) by mouth daily. 90 tablet 1  . ALPRAZolam (XANAX) 1 MG tablet Take 1 tablet (1 mg total) by mouth 3 (three) times daily as needed for anxiety. 90 tablet 1  . augmented betamethasone dipropionate (DIPROLENE-AF) 0.05 % cream Apply topically 2 (two) times daily. 50 g 3  . baclofen (LIORESAL) 10 MG tablet TAKE 1 TABLET (10 MG TOTAL) BY MOUTH 3 (THREE TIMES DAILY) (Patient taking differently: Take 10 mg by mouth 3 (three) times daily as needed for muscle spasms. ) 90 tablet 2  . benazepril (LOTENSIN) 40 MG tablet Take 1 tablet (40 mg total) by mouth daily. 90 tablet 3  . Biotin 5000 MCG CAPS Take 10,000 mcg by mouth daily.     . calcium carbonate (OSCAL) 1500 (600 Ca) MG TABS tablet Take 600 mg of elemental calcium by mouth daily with breakfast.    . cetirizine (ZYRTEC) 10 MG tablet Take 10 mg by mouth daily. Alternates between Allegra and Zyrtec as needed for allergies    . dexlansoprazole (DEXILANT) 60 MG capsule Take 60 mg by mouth 2 (two) times daily.    . fexofenadine (ALLEGRA) 180 MG tablet Take 180 mg by mouth daily as needed. Switches between Allegra and Zyrtec as needed for allergies    . fluticasone (FLONASE) 50 MCG/ACT nasal spray Place 2 sprays into both nostrils daily. 48 g 2  . HYDROcodone-acetaminophen  (NORCO) 10-325 MG per tablet Take 1-2 tablets by mouth every 6 (six) hours as needed for moderate pain (pain.).     Marland Kitchen metoprolol succinate (TOPROL-XL) 100 MG 24 hr tablet Take 1 tablet (100 mg total) by mouth daily. Take with or immediately following a meal. 90 tablet 3  . modafinil (PROVIGIL) 200 MG tablet Take 0.5 tablets (100 mg total) by mouth 2 (two) times daily. 90 tablet 1  . montelukast (SINGULAIR) 10 MG tablet TAKE 1 TABLET (10 MG TOTAL) BY MOUTH AT BEDTIME 90 tablet 3  . morphine (MS CONTIN) 30 MG 12 hr tablet Take 30 mg by mouth every 8 (eight) hours. Not from [facility]    . Multiple Vitamins-Minerals (MULTIVITAMIN PO) Take 1 tablet by mouth daily.    . Olopatadine HCl (PAZEO) 0.7 % SOLN Apply 1 drop to eye daily. 2.5 mL 11  . Pancrelipase, Lip-Prot-Amyl, (CREON) 24000 UNITS CPEP Take 72,000 Units by mouth 3 (three) times daily.     Marland Kitchen PROAIR HFA 108 (90 Base) MCG/ACT inhaler INHALE 2 PUFFS INTO THE LUNGS AS NEEDED FOR  WHEEZING OR SHORTNESS OF BREATH. 8.5 each 0  . promethazine (PHENERGAN) 25 MG tablet Take 1 tablet (25 mg total) by mouth every 6 (six) hours as needed for nausea or vomiting. 30 tablet 1  . spironolactone (ALDACTONE) 100 MG tablet Take 1 tablet (100 mg total) by mouth daily. 90 tablet 3  . sucralfate (CARAFATE) 1 GM/10ML suspension Take 10 mLs (1 g total) by mouth 4 (four) times daily as needed (stomach pain). 420 mL 1  . predniSONE (DELTASONE) 1 MG tablet Take 4 mg by mouth daily with breakfast.     No current facility-administered medications on file prior to visit.     PE:  GS: WDWN female sitting on exam table in NAD.  Vitals: BP 110/60 (BP Location: Left Wrist, Patient Position: Sitting, Cuff Size: Normal)   Pulse 86   Temp 98.1 F (36.7 C) (Oral)   Resp 16   Ht 5' 4.5" (1.638 m)   Wt 293 lb (132.9 kg)   SpO2 97%   BMI 49.52 kg/m  HEENT: Normocephalic, atruamatic. PEARRL.  Cardiovascular: RRR. No S3 or S4. No murmurs, rubs, or gallops. Pulses 2+ and equal  bilateral in the upper and lower extremities. No pitting edema. No varicosities, clubbing, or cyanosis.  Pulm: CTA bilaterally. No expiratory muscle use while breathing.  GI: +BS. NTND. No rigidity or guarding. No rebound tenderness. No CVA TTP.  Neuro: CN 2-12 grossly intact.  Psych: A&O x 4. Mood and affect appropriate for situation.  Skin: Warm and dry. No rashes or excoriations on exposed skin.   A&P:  1. Flank pain - cause is known.   Plan:  POCT Microscopic Urinalysis ([facility]), POCT urinalysis dipstick, Urine culture Continue current regimen, rest. Will f/u with lab results.   2. Nausea without vomiting - controlled with Phenergan. Will follow up pending labs  Plan: Lipase, Comprehensive metabolic panel       Respectfully,  Delilah Shan, PA-S2

## 2016-09-18 NOTE — Patient Instructions (Addendum)
Keep doing what you are doing. I'll let you know as soon as I get the results of the tests.     IF you received an x-ray today, you will receive an invoice from [facility]. Please contact [facility] at [phone] with questions or concerns regarding your invoice.   IF you received labwork today, you will receive an invoice from [facility]. Please contact [facility] at [phone] with questions or concerns regarding your invoice.   Our billing staff will not be able to assist you with questions regarding bills from these companies.  You will be contacted with the lab results as soon as they are available. The fastest way to get your results is to activate your My Chart account. Instructions are located on the last page of this paperwork. If you have not heard from [place] regarding the results in 2 weeks, please contact this office.

## 2016-09-18 NOTE — Progress Notes (Signed)
Patient ID: Valerie [name], female    DOB: 07/01/1960, 56 y.o.   MRN: [id number]  PCP: Delman Cheadle, MD  Chief Complaint  Patient presents with  . Flank Pain    Subjective:   Presents for evaluation of 4 days of flank pain.  She describes discomfort in the bladder, nausea and generalize malaise. Nausea has been present for several months. Uses promethazine at home as needed, which helps. No vomiting. No fever, chills.  History of acute kidney injury in 12/2015, that felt different, but she thought this might be the same, so stopped lisinopril and spironolactone upon the advice of her PCP and urologist. Has increased her oral hydration with Gatorade and tried baclofen without benefit.  She describes the pain as a dull ache that comes and goes. The pain is located under the ribs on the RIGHT. Similar episode 04/2016, treated with IV hydration. She has gastroparesis, most recent Botox injection for that was 04/2016.  Not like previous UTI. Urine was darker than usual on 4/05-06, but normalized with increased fluids. No urgency, frequency or burning with urination. No hematuria.  No diarrhea, constipation.    Review of Systems As above. Otherwise negative.    Patient Active Problem List   Diagnosis Date Noted  . Gastroparesis 09/18/2016  . Flank pain 09/18/2016  . Nausea without vomiting 09/18/2016  . ARF (acute renal failure) ([place]) 12/28/2015  . CKD (chronic kidney disease) stage 3, GFR 30-59 ml/min   . Neoplasm of uncertain behavior of skin 02/03/2014  . Obesity, morbid ([place]) 10/21/2012  . OSA on CPAP 10/21/2012  . Fibromyalgia 06/22/2012  . Back pain 06/22/2012  . Recurrent pancreatitis ([place]) 06/22/2012  . Anemia 05/01/2012  . Colon polyp 08/01/2011  . PTSD (post-traumatic stress disorder) 08/01/2011  . Hypertension 08/01/2011  . IBS (irritable bowel syndrome) 08/01/2011     Prior to Admission medications   Medication Sig Start Date End Date Taking?  Authorizing Provider  allopurinol (ZYLOPRIM) 300 MG tablet Take 0.5 tablets (150 mg total) by mouth daily. 07/31/16  Yes Shawnee Knapp, MD  ALPRAZolam Duanne Moron) 1 MG tablet Take 1 tablet (1 mg total) by mouth 3 (three) times daily as needed for anxiety. 07/31/16  Yes Shawnee Knapp, MD  augmented betamethasone dipropionate (DIPROLENE-AF) 0.05 % cream Apply topically 2 (two) times daily. 08/02/16  Yes Shawnee Knapp, MD  baclofen (LIORESAL) 10 MG tablet TAKE 1 TABLET (10 MG TOTAL) BY MOUTH 3 (THREE TIMES DAILY) Patient taking differently: Take 10 mg by mouth 3 (three) times daily as needed for muscle spasms.  01/12/16  Yes Darlyne Russian, MD  benazepril (LOTENSIN) 40 MG tablet Take 1 tablet (40 mg total) by mouth daily. 02/18/16  Yes Darlyne Russian, MD  Biotin 5000 MCG CAPS Take 10,000 mcg by mouth daily.    Yes Historical Provider, MD  calcium carbonate (OSCAL) 1500 (600 Ca) MG TABS tablet Take 600 mg of elemental calcium by mouth daily with breakfast.   Yes Historical Provider, MD  cetirizine (ZYRTEC) 10 MG tablet Take 10 mg by mouth daily. Alternates between Allegra and Zyrtec as needed for allergies   Yes Historical Provider, MD  dexlansoprazole (DEXILANT) 60 MG capsule Take 60 mg by mouth 2 (two) times daily.   Yes Historical Provider, MD  fexofenadine (ALLEGRA) 180 MG tablet Take 180 mg by mouth daily as needed. Switches between Allegra and Zyrtec as needed for allergies   Yes Historical Provider, MD  fluticasone (FLONASE) 50 MCG/ACT  nasal spray Place 2 sprays into both nostrils daily. 07/31/16  Yes Shawnee Knapp, MD  HYDROcodone-acetaminophen (NORCO) 10-325 MG per tablet Take 1-2 tablets by mouth every 6 (six) hours as needed for moderate pain (pain.).    Yes Historical Provider, MD  metoprolol succinate (TOPROL-XL) 100 MG 24 hr tablet Take 1 tablet (100 mg total) by mouth daily. Take with or immediately following a meal. 07/31/16  Yes Shawnee Knapp, MD  modafinil (PROVIGIL) 200 MG tablet Take 0.5 tablets (100 mg total) by  mouth 2 (two) times daily. 12/30/15  Yes Carmen Dohmeier, MD  montelukast (SINGULAIR) 10 MG tablet TAKE 1 TABLET (10 MG TOTAL) BY MOUTH AT BEDTIME 07/31/16  Yes Shawnee Knapp, MD  morphine (MS CONTIN) 30 MG 12 hr tablet Take 30 mg by mouth every 8 (eight) hours. Not from [facility] 09/23/12  Yes Historical Provider, MD  Multiple Vitamins-Minerals (MULTIVITAMIN PO) Take 1 tablet by mouth daily.   Yes Historical Provider, MD  Olopatadine HCl (PAZEO) 0.7 % SOLN Apply 1 drop to eye daily. 08/02/16  Yes Shawnee Knapp, MD  Pancrelipase, Lip-Prot-Amyl, (CREON) 24000 UNITS CPEP Take 72,000 Units by mouth 3 (three) times daily.    Yes Historical Provider, MD  PROAIR HFA 108 (90 Base) MCG/ACT inhaler INHALE 2 PUFFS INTO THE LUNGS AS NEEDED FOR WHEEZING OR SHORTNESS OF BREATH. 05/29/16  Yes Shawnee Knapp, MD  promethazine (PHENERGAN) 25 MG tablet Take 1 tablet (25 mg total) by mouth every 6 (six) hours as needed for nausea or vomiting. 07/31/16  Yes Shawnee Knapp, MD  spironolactone (ALDACTONE) 100 MG tablet Take 1 tablet (100 mg total) by mouth daily. 07/31/16  Yes Shawnee Knapp, MD  sucralfate (CARAFATE) 1 GM/10ML suspension Take 10 mLs (1 g total) by mouth 4 (four) times daily as needed (stomach pain). 07/31/16  Yes Shawnee Knapp, MD  predniSONE (DELTASONE) 1 MG tablet Take 4 mg by mouth daily with breakfast.    Historical Provider, MD     Allergies  Allergen Reactions  . Dilaudid [Hydromorphone Hcl] Other (See Comments)    Headache   . Pregabalin Other (See Comments)    Drug induced stupor  . Metoclopramide Hcl Other (See Comments)    "crawling inside"  . Neurontin [Gabapentin] Other (See Comments)    Tremors   . Other Other (See Comments)    Avoid all SSRI's and SNRI's- damaged pancreas       Objective:  Physical Exam  Constitutional: She is oriented to person, place, and time. She appears well-developed and well-nourished. She is active and cooperative. No distress.  BP 110/60 (BP Location: Left Wrist, Patient Position:  Sitting, Cuff Size: Normal)   Pulse 86   Temp 98.1 F (36.7 C) (Oral)   Resp 16   Ht 5' 4.5" (1.638 m)   Wt 293 lb (132.9 kg)   SpO2 97%   BMI 49.52 kg/m   HENT:  Head: Normocephalic and atraumatic.  Right Ear: Hearing normal.  Left Ear: Hearing normal.  Eyes: Conjunctivae are normal. No scleral icterus.  Neck: Normal range of motion. Neck supple. No thyromegaly present.  Cardiovascular: Normal rate, regular rhythm and normal heart sounds.   Pulses:      Radial pulses are 2+ on the right side, and 2+ on the left side.  Pulmonary/Chest: Effort normal and breath sounds normal.  Abdominal: Soft. Bowel sounds are normal. She exhibits no distension and no mass. There is no hepatosplenomegaly. There is no tenderness.  There is no rebound, no guarding and no CVA tenderness. No hernia.  Lymphadenopathy:       Head (right side): No tonsillar, no preauricular, no posterior auricular and no occipital adenopathy present.       Head (left side): No tonsillar, no preauricular, no posterior auricular and no occipital adenopathy present.    She has no cervical adenopathy.       Right: No supraclavicular adenopathy present.       Left: No supraclavicular adenopathy present.  Neurological: She is alert and oriented to person, place, and time. No sensory deficit.  Skin: Skin is warm, dry and intact. No rash noted. No cyanosis or erythema. Nails show no clubbing.  Psychiatric: She has a normal mood and affect. Her speech is normal and behavior is normal.       Wt Readings from Last 3 Encounters:  09/18/16 293 lb (132.9 kg)  07/31/16 295 lb (133.8 kg)  04/27/16 287 lb (130.2 kg)   Results for orders placed or performed in visit on 09/18/16  POCT Microscopic Urinalysis ([facility])  Result Value Ref Range   WBC,UR,HPF,POC None None WBC/hpf   RBC,UR,HPF,POC None None RBC/hpf   Bacteria Few (A) None, Too numerous to count   Mucus Absent Absent   Epithelial Cells, UR Per Microscopy Few (A) None, Too  numerous to count cells/hpf  POCT urinalysis dipstick  Result Value Ref Range   Color, UA yellow yellow   Clarity, UA clear clear   Glucose, UA negative negative   Bilirubin, UA negative negative   Ketones, POC UA negative negative   Spec Grav, UA 1.005 1.030 - 1.035   Blood, UA negative negative   pH, UA 5.0 5.0 - 8.0   Protein Ur, POC negative negative   Urobilinogen, UA 0.2 Negative - 2.0   Nitrite, UA Negative Negative   Leukocytes, UA Negative Negative       Assessment & Plan:   1. Flank pain 2. Nausea without vomiting Unclear etiology. Await remaining labs. Continue promethazine as needed. - POCT Microscopic Urinalysis ([facility]) - POCT urinalysis dipstick - Urine culture - Lipase - Comprehensive metabolic panel    Return if symptoms worsen or fail to improve.   Fara Chute, PA-C [facility] [place]

## 2016-09-19 DIAGNOSIS — M542 Cervicalgia: Secondary | ICD-10-CM | POA: Diagnosis not present

## 2016-09-19 LAB — COMPREHENSIVE METABOLIC PANEL
A/G RATIO: 1.5 (ref 1.2–2.2)
ALBUMIN: 4.5 g/dL (ref 3.5–5.5)
ALT: 12 IU/L (ref 0–32)
AST: 12 IU/L (ref 0–40)
Alkaline Phosphatase: 57 IU/L (ref 39–117)
BUN / CREAT RATIO: 18 (ref 9–23)
BUN: 19 mg/dL (ref 6–24)
Bilirubin Total: <0.2 mg/dL (ref 0.0–1.2)
CALCIUM: 9.9 mg/dL (ref 8.7–10.2)
CO2: 19 mmol/L (ref 18–29)
Chloride: 103 mmol/L (ref 96–106)
Creatinine, Ser: 1.06 mg/dL — ABNORMAL HIGH (ref 0.57–1.00)
GFR, EST AFRICAN AMERICAN: 69 mL/min/{1.73_m2} (ref >59)
GFR, EST NON AFRICAN AMERICAN: 60 mL/min/{1.73_m2} (ref >59)
GLOBULIN, TOTAL: 3.1 g/dL (ref 1.5–4.5)
Glucose: 109 mg/dL — ABNORMAL HIGH (ref 65–99)
Potassium: 4.8 mmol/L (ref 3.5–5.2)
SODIUM: 143 mmol/L (ref 134–144)
TOTAL PROTEIN: 7.6 g/dL (ref 6.0–8.5)

## 2016-09-19 LAB — LIPASE: Lipase: 68 U/L (ref 14–72)

## 2016-09-20 LAB — URINE CULTURE: Organism ID, Bacteria: NO GROWTH

## 2016-09-21 DIAGNOSIS — M542 Cervicalgia: Secondary | ICD-10-CM | POA: Diagnosis not present

## 2016-09-22 ENCOUNTER — Ambulatory Visit
Admission: RE | Admit: 2016-09-22 | Discharge: 2016-09-22 | Disposition: A | Discharge status: Home / Self Care | Payer: Medicare Other | Source: Ambulatory Visit | Attending: Family Medicine | Admitting: Family Medicine

## 2016-09-22 DIAGNOSIS — Z1239 Encounter for other screening for malignant neoplasm of breast: Secondary | ICD-10-CM

## 2016-09-22 DIAGNOSIS — Z1231 Encounter for screening mammogram for malignant neoplasm of breast: Secondary | ICD-10-CM | POA: Diagnosis not present

## 2016-09-23 ENCOUNTER — Encounter (HOSPITAL_COMMUNITY): Payer: Self-pay | Admitting: Emergency Medicine

## 2016-09-23 ENCOUNTER — Inpatient Hospital Stay (HOSPITAL_COMMUNITY)
Admission: EM | Admit: 2016-09-23 | Discharge: 2016-09-29 | DRG: 394 | Disposition: A | Discharge status: Home / Self Care | Payer: Medicare Other | Attending: Family Medicine | Admitting: Family Medicine

## 2016-09-23 DIAGNOSIS — N179 Acute kidney failure, unspecified: Secondary | ICD-10-CM | POA: Diagnosis present

## 2016-09-23 DIAGNOSIS — F419 Anxiety disorder, unspecified: Secondary | ICD-10-CM | POA: Diagnosis present

## 2016-09-23 DIAGNOSIS — K921 Melena: Secondary | ICD-10-CM | POA: Diagnosis not present

## 2016-09-23 DIAGNOSIS — D72829 Elevated white blood cell count, unspecified: Secondary | ICD-10-CM | POA: Diagnosis present

## 2016-09-23 DIAGNOSIS — R197 Diarrhea, unspecified: Secondary | ICD-10-CM | POA: Diagnosis present

## 2016-09-23 DIAGNOSIS — E860 Dehydration: Secondary | ICD-10-CM

## 2016-09-23 DIAGNOSIS — Z8719 Personal history of other diseases of the digestive system: Secondary | ICD-10-CM

## 2016-09-23 DIAGNOSIS — K8681 Exocrine pancreatic insufficiency: Secondary | ICD-10-CM | POA: Diagnosis present

## 2016-09-23 DIAGNOSIS — M797 Fibromyalgia: Secondary | ICD-10-CM | POA: Diagnosis present

## 2016-09-23 DIAGNOSIS — M353 Polymyalgia rheumatica: Secondary | ICD-10-CM | POA: Diagnosis present

## 2016-09-23 DIAGNOSIS — Z6841 Body Mass Index (BMI) 40.0 and over, adult: Secondary | ICD-10-CM

## 2016-09-23 DIAGNOSIS — K861 Other chronic pancreatitis: Secondary | ICD-10-CM | POA: Diagnosis present

## 2016-09-23 DIAGNOSIS — K529 Noninfective gastroenteritis and colitis, unspecified: Secondary | ICD-10-CM | POA: Diagnosis not present

## 2016-09-23 DIAGNOSIS — K559 Vascular disorder of intestine, unspecified: Principal | ICD-10-CM | POA: Diagnosis present

## 2016-09-23 DIAGNOSIS — R935 Abnormal findings on diagnostic imaging of other abdominal regions, including retroperitoneum: Secondary | ICD-10-CM | POA: Diagnosis present

## 2016-09-23 DIAGNOSIS — Z888 Allergy status to other drugs, medicaments and biological substances status: Secondary | ICD-10-CM

## 2016-09-23 DIAGNOSIS — R1084 Generalized abdominal pain: Secondary | ICD-10-CM | POA: Diagnosis present

## 2016-09-23 DIAGNOSIS — I129 Hypertensive chronic kidney disease with stage 1 through stage 4 chronic kidney disease, or unspecified chronic kidney disease: Secondary | ICD-10-CM | POA: Diagnosis present

## 2016-09-23 DIAGNOSIS — J45909 Unspecified asthma, uncomplicated: Secondary | ICD-10-CM | POA: Diagnosis present

## 2016-09-23 DIAGNOSIS — R7302 Impaired glucose tolerance (oral): Secondary | ICD-10-CM

## 2016-09-23 DIAGNOSIS — Z79899 Other long term (current) drug therapy: Secondary | ICD-10-CM

## 2016-09-23 DIAGNOSIS — K5730 Diverticulosis of large intestine without perforation or abscess without bleeding: Secondary | ICD-10-CM | POA: Diagnosis present

## 2016-09-23 DIAGNOSIS — K219 Gastro-esophageal reflux disease without esophagitis: Secondary | ICD-10-CM | POA: Diagnosis present

## 2016-09-23 DIAGNOSIS — Z9049 Acquired absence of other specified parts of digestive tract: Secondary | ICD-10-CM

## 2016-09-23 DIAGNOSIS — Z9989 Dependence on other enabling machines and devices: Secondary | ICD-10-CM

## 2016-09-23 DIAGNOSIS — K640 First degree hemorrhoids: Secondary | ICD-10-CM | POA: Diagnosis present

## 2016-09-23 DIAGNOSIS — Z7952 Long term (current) use of systemic steroids: Secondary | ICD-10-CM

## 2016-09-23 DIAGNOSIS — N183 Chronic kidney disease, stage 3 (moderate): Secondary | ICD-10-CM | POA: Diagnosis present

## 2016-09-23 DIAGNOSIS — R109 Unspecified abdominal pain: Secondary | ICD-10-CM | POA: Diagnosis not present

## 2016-09-23 DIAGNOSIS — G8929 Other chronic pain: Secondary | ICD-10-CM | POA: Diagnosis present

## 2016-09-23 DIAGNOSIS — G4733 Obstructive sleep apnea (adult) (pediatric): Secondary | ICD-10-CM | POA: Diagnosis present

## 2016-09-23 DIAGNOSIS — K50111 Crohn's disease of large intestine with rectal bleeding: Secondary | ICD-10-CM

## 2016-09-23 DIAGNOSIS — K6389 Other specified diseases of intestine: Secondary | ICD-10-CM | POA: Diagnosis present

## 2016-09-23 DIAGNOSIS — Z79891 Long term (current) use of opiate analgesic: Secondary | ICD-10-CM

## 2016-09-23 DIAGNOSIS — K922 Gastrointestinal hemorrhage, unspecified: Secondary | ICD-10-CM

## 2016-09-23 DIAGNOSIS — I776 Arteritis, unspecified: Secondary | ICD-10-CM | POA: Diagnosis present

## 2016-09-23 DIAGNOSIS — Z885 Allergy status to narcotic agent status: Secondary | ICD-10-CM

## 2016-09-23 LAB — COMPREHENSIVE METABOLIC PANEL
ALBUMIN: 4.7 g/dL (ref 3.5–5.0)
ALK PHOS: 45 U/L (ref 38–126)
ALT: 17 U/L (ref 14–54)
AST: 21 U/L (ref 15–41)
Anion gap: 14 (ref 5–15)
BILIRUBIN TOTAL: 0.8 mg/dL (ref 0.3–1.2)
BUN: 23 mg/dL — ABNORMAL HIGH (ref 6–20)
CALCIUM: 10 mg/dL (ref 8.9–10.3)
CO2: 18 mmol/L — AB (ref 22–32)
Chloride: 105 mmol/L (ref 101–111)
Creatinine, Ser: 1.4 mg/dL — ABNORMAL HIGH (ref 0.44–1.00)
GFR calc Af Amer: 48 mL/min — ABNORMAL LOW (ref >60)
GFR calc non Af Amer: 42 mL/min — ABNORMAL LOW (ref >60)
GLUCOSE: 142 mg/dL — AB (ref 65–99)
POTASSIUM: 3.8 mmol/L (ref 3.5–5.1)
SODIUM: 137 mmol/L (ref 135–145)
TOTAL PROTEIN: 8.5 g/dL — AB (ref 6.5–8.1)

## 2016-09-23 LAB — CBC
HEMATOCRIT: 41.7 % (ref 36.0–46.0)
HEMOGLOBIN: 14 g/dL (ref 12.0–15.0)
MCH: 30.4 pg (ref 26.0–34.0)
MCHC: 33.6 g/dL (ref 30.0–36.0)
MCV: 90.7 fL (ref 78.0–100.0)
Platelets: 332 10*3/uL (ref 150–400)
RBC: 4.6 MIL/uL (ref 3.87–5.11)
RDW: 13.5 % (ref 11.5–15.5)
WBC: 16.7 10*3/uL — ABNORMAL HIGH (ref 4.0–10.5)

## 2016-09-23 LAB — POC OCCULT BLOOD, ED: Fecal Occult Bld: POSITIVE — AB

## 2016-09-23 NOTE — ED Triage Notes (Signed)
Pt states today around noon she started having abd pain and cramping  Pt states she went to use the bathroom and got dizzy and felt like she was going to pass out  Pt states she had a regular bowel movement but the cramping got worse and for the past 5 times she had a bowel movement it has been bright red   Pt state her pain is continuous and is under the right rib area   Pt states she feels light headed and shaky  Pt states she did take MS Contin 30mg  approximately 2000

## 2016-09-23 NOTE — ED Provider Notes (Signed)
[facility] DEPT Provider Note   CSN: [id number] Arrival date & time: 09/23/16  2229  By signing my name below, I, Reola Mosher, attest that this documentation has been prepared under the direction and in the presence of Charlann Lange, PA-C.  Electronically Signed: Reola Mosher, ED Scribe. 09/23/16. 11:51 PM.  History   Chief Complaint Chief Complaint  Patient presents with  . Rectal Bleeding   The history is provided by the patient and medical records. No language interpreter was used.    HPI Comments: Valerie [name] is a 56 y.o. female with a h/o acute ischemic colitis, anemia, HTN, pancreatitis, gastroparesis, and colon polyps, who presents to the Emergency Department complaining of multiple episodes of bright-red hematochezia beginning approximately 12 hours ago. Per pt, she had a sudden onset onset of lower abdominal cramping pain and had a regular bowel movement shortly following. Following this, her abdominal pain acutely worsened and she had five more bowel movements which were watery and included bright-red bloody contents mixed in with her stool. She notes that her abdominal pain has now localized to her right lower quadrant which radiates into her right lower back; this has also been worsening while in the ED, per pt. Pt also reports associated light-headedness and generalized weakness. Additionally, pt took 30mg  of MS Contin approximately four hours prior to her arrival in the ED without noted relief of her pain. Per pt, her symptoms today are similar to the onset of her prior bout of ischemic colitis. Per prior chart review, pt most recently underwent an upper GI endoscopy on 04/27/16 (~5 months ago) which was unremarkable. Pt has not previously required a blood transfusion. She denies nausea, vomiting, fever, urgency, frequency, hematuria, dysuria, difficulty urinating, syncope, or any other associated symptoms.   PCP: Delman Cheadle, MD Primary GI: Teena Irani,  MD  Past Medical History:  Diagnosis Date  . Acute ischemic colitis ([place])   . Anemia 05/01/2012  . Anxiety   . ARF (acute renal failure) ([place]) 12/28/2015  . Arthritis    "basically ~ q joint" (12/28/2015)  . Asthma   . Basal cell carcinoma of upper back ~ 2011  . Chronic lower back pain   . DDD (degenerative disc disease), lumbar   . Diarrhea    EPISODIC  . Fibromyalgia   . First degree heart block   . Gastro-esophageal reflux   . Headache(784.0)    "muscular w/migraine intensity; ~ 5-6 months" (12/28/2015)  . Hypertension   . Mental disorder   . Nerve pain    nerve damage to right arm  . Neuromuscular disorder ([place])   . Pancreatitis   . Pneumonia ~ 2014; 08/2015  . Polymyalgia rheumatica ([place]) onset 08/2015  . Precancerous melanosis    "2nd toe left foot; 5th toe of right foot; right temple; had them all cut off"  . Sleep apnea    CPAP-9   Patient Active Problem List   Diagnosis Date Noted  . Gastroparesis 09/18/2016  . Flank pain 09/18/2016  . Nausea without vomiting 09/18/2016  . ARF (acute renal failure) ([place]) 12/28/2015  . CKD (chronic kidney disease) stage 3, GFR 30-59 ml/min   . Neoplasm of uncertain behavior of skin 02/03/2014  . Obesity, morbid ([place]) 10/21/2012  . OSA on CPAP 10/21/2012  . Fibromyalgia 06/22/2012  . Back pain 06/22/2012  . Recurrent pancreatitis ([place]) 06/22/2012  . Anemia 05/01/2012  . Colon polyp 08/01/2011  . PTSD (post-traumatic stress disorder) 08/01/2011  . Hypertension 08/01/2011  .  IBS (irritable bowel syndrome) 08/01/2011   Past Surgical History:  Procedure Laterality Date  . BASAL CELL CARCINOMA EXCISION  ~ 2011   back  . BOTOX INJECTION N/A 11/06/2012   Procedure: BOTOX INJECTION;  Surgeon: Missy Sabins, MD;  Location: [facility] ENDOSCOPY;  Service: Endoscopy;  Laterality: N/A;  . BOTOX INJECTION N/A 12/04/2013   Procedure: BOTOX INJECTION;  Surgeon: Missy Sabins, MD;  Location: [facility];  Service: Endoscopy;  Laterality: N/A;  .  BOTOX INJECTION N/A 02/26/2014   Procedure: BOTOX INJECTION;  Surgeon: Missy Sabins, MD;  Location: [facility];  Service: Endoscopy;  Laterality: N/A;  . BOTOX INJECTION N/A 07/08/2015   Procedure: BOTOX INJECTION;  Surgeon: Teena Irani, MD;  Location: [facility];  Service: Endoscopy;  Laterality: N/A;  . BOTOX INJECTION N/A 04/27/2016   Procedure: BOTOX INJECTION;  Surgeon: Teena Irani, MD;  Location: [facility];  Service: Endoscopy;  Laterality: N/A;  . ESOPHAGOGASTRODUODENOSCOPY  12/21/2011   Procedure: ESOPHAGOGASTRODUODENOSCOPY (EGD);  Surgeon: Missy Sabins, MD;  Location: [facility] ENDOSCOPY;  Service: Endoscopy;  Laterality: N/A;  . ESOPHAGOGASTRODUODENOSCOPY N/A 11/06/2012   Procedure: ESOPHAGOGASTRODUODENOSCOPY (EGD);  Surgeon: Missy Sabins, MD;  Location: [facility];  Service: Endoscopy;  Laterality: N/A;  . ESOPHAGOGASTRODUODENOSCOPY N/A 12/04/2013   Procedure: ESOPHAGOGASTRODUODENOSCOPY (EGD);  Surgeon: Missy Sabins, MD;  Location: [facility] ENDOSCOPY;  Service: Endoscopy;  Laterality: N/A;  . ESOPHAGOGASTRODUODENOSCOPY N/A 02/26/2014   Procedure: ESOPHAGOGASTRODUODENOSCOPY (EGD);  Surgeon: Missy Sabins, MD;  Location: [facility] ENDOSCOPY;  Service: Endoscopy;  Laterality: N/A;  . ESOPHAGOGASTRODUODENOSCOPY (EGD) WITH PROPOFOL N/A 07/08/2015   Procedure: ESOPHAGOGASTRODUODENOSCOPY (EGD) WITH PROPOFOL;  Surgeon: Teena Irani, MD;  Location: [facility] ENDOS[facility];  Service: Endoscopy;  Laterality: N/A;  . ESOPHAGOGASTRODUODENOSCOPY (EGD) WITH PROPOFOL N/A 04/27/2016   Procedure: ESOPHAGOGASTRODUODENOSCOPY (EGD) WITH PROPOFOL;  Surgeon: Teena Irani, MD;  Location: [facility];  Service: Endoscopy;  Laterality: N/A;  . FLEXIBLE SIGMOIDOSCOPY  05/01/2012   Procedure: FLEXIBLE SIGMOIDOSCOPY;  Surgeon: Jeryl Columbia, MD;  Location: [facility] ENDOS[facility];  Service: Endoscopy;  Laterality: N/A;  unprepped  . LAPAROSCOPIC CHOLECYSTECTOMY  01/2002  . NASAL SEPTOPLASTY W/ TURBINOPLASTY  1998  . SHOULDER ARTHROSCOPY WITH ROTATOR CUFF  REPAIR AND OPEN BICEPS TENODESIS Bilateral 2010-2013   left-right   OB History    No data available     Home Medications    Prior to Admission medications   Medication Sig Start Date End Date Taking? Authorizing Provider  allopurinol (ZYLOPRIM) 300 MG tablet Take 0.5 tablets (150 mg total) by mouth daily. 07/31/16   Shawnee Knapp, MD  ALPRAZolam Duanne Moron) 1 MG tablet Take 1 tablet (1 mg total) by mouth 3 (three) times daily as needed for anxiety. 07/31/16   Shawnee Knapp, MD  augmented betamethasone dipropionate (DIPROLENE-AF) 0.05 % cream Apply topically 2 (two) times daily. 08/02/16   Shawnee Knapp, MD  baclofen (LIORESAL) 10 MG tablet TAKE 1 TABLET (10 MG TOTAL) BY MOUTH 3 (THREE TIMES DAILY) Patient taking differently: Take 10 mg by mouth 3 (three) times daily as needed for muscle spasms.  01/12/16   Darlyne Russian, MD  benazepril (LOTENSIN) 40 MG tablet Take 1 tablet (40 mg total) by mouth daily. 02/18/16   Darlyne Russian, MD  Biotin 5000 MCG CAPS Take 10,000 mcg by mouth daily.     Historical Provider, MD  calcium carbonate (OSCAL) 1500 (600 Ca) MG TABS tablet Take 600 mg of elemental calcium by mouth daily with breakfast.    Historical Provider,  MD  cetirizine (ZYRTEC) 10 MG tablet Take 10 mg by mouth daily. Alternates between Allegra and Zyrtec as needed for allergies    Historical Provider, MD  dexlansoprazole (DEXILANT) 60 MG capsule Take 60 mg by mouth 2 (two) times daily.    Historical Provider, MD  fexofenadine (ALLEGRA) 180 MG tablet Take 180 mg by mouth daily as needed. Switches between Allegra and Zyrtec as needed for allergies    Historical Provider, MD  fluticasone (FLONASE) 50 MCG/ACT nasal spray Place 2 sprays into both nostrils daily. 07/31/16   Shawnee Knapp, MD  HYDROcodone-acetaminophen (NORCO) 10-325 MG per tablet Take 1-2 tablets by mouth every 6 (six) hours as needed for moderate pain (pain.).     Historical Provider, MD  metoprolol succinate (TOPROL-XL) 100 MG 24 hr tablet Take 1 tablet  (100 mg total) by mouth daily. Take with or immediately following a meal. 07/31/16   Shawnee Knapp, MD  modafinil (PROVIGIL) 200 MG tablet Take 0.5 tablets (100 mg total) by mouth 2 (two) times daily. 12/30/15   Carmen Dohmeier, MD  montelukast (SINGULAIR) 10 MG tablet TAKE 1 TABLET (10 MG TOTAL) BY MOUTH AT BEDTIME 07/31/16   Shawnee Knapp, MD  morphine (MS CONTIN) 30 MG 12 hr tablet Take 30 mg by mouth every 8 (eight) hours. Not from [facility] 09/23/12   Historical Provider, MD  Multiple Vitamins-Minerals (MULTIVITAMIN PO) Take 1 tablet by mouth daily.    Historical Provider, MD  Olopatadine HCl (PAZEO) 0.7 % SOLN Apply 1 drop to eye daily. 08/02/16   Shawnee Knapp, MD  Pancrelipase, Lip-Prot-Amyl, (CREON) 24000 UNITS CPEP Take 72,000 Units by mouth 3 (three) times daily.     Historical Provider, MD  predniSONE (DELTASONE) 1 MG tablet Take 4 mg by mouth daily with breakfast.    Historical Provider, MD  PROAIR HFA 108 (90 Base) MCG/ACT inhaler INHALE 2 PUFFS INTO THE LUNGS AS NEEDED FOR WHEEZING OR SHORTNESS OF BREATH. 05/29/16   Shawnee Knapp, MD  promethazine (PHENERGAN) 25 MG tablet Take 1 tablet (25 mg total) by mouth every 6 (six) hours as needed for nausea or vomiting. 07/31/16   Shawnee Knapp, MD  spironolactone (ALDACTONE) 100 MG tablet Take 1 tablet (100 mg total) by mouth daily. 07/31/16   Shawnee Knapp, MD  sucralfate (CARAFATE) 1 GM/10ML suspension Take 10 mLs (1 g total) by mouth 4 (four) times daily as needed (stomach pain). 07/31/16   Shawnee Knapp, MD   Family History Family History  Problem Relation Age of Onset  . Heart disease Mother   . COPD Mother   . Arthritis Mother   . Cancer Mother   . Hypertension Mother   . Arthritis Paternal Grandmother   . Heart disease Paternal Grandmother   . Heart disease Paternal Grandfather    Social History Social History  Substance Use Topics  . Smoking status: Never Smoker  . Smokeless tobacco: Never Used  . Alcohol use No   Allergies   Dilaudid [hydromorphone  hcl]; Pregabalin; Metoclopramide hcl; Neurontin [gabapentin]; and Other  Review of Systems Review of Systems  Constitutional: Negative for fever.  Gastrointestinal: Positive for anal bleeding, diarrhea and hematochezia. Negative for nausea and vomiting.  Genitourinary: Negative for difficulty urinating, dysuria, frequency, hematuria and urgency.  Neurological: Positive for weakness (generalized) and light-headedness. Negative for syncope.   Physical Exam Updated Vital Signs BP (!) 142/96 (BP Location: Left Wrist)   Pulse (!) 105   Temp 98.5 F (36.9  C) (Oral)   Resp 18   SpO2 98%   Physical Exam  Constitutional: She appears well-developed and well-nourished. No distress.  HENT:  Head: Normocephalic and atraumatic.  Eyes: Conjunctivae are normal.  Neck: Normal range of motion.  Cardiovascular: Normal rate.   Pulmonary/Chest: Effort normal.  Abdominal: Soft. She exhibits no distension. There is tenderness. There is no guarding.  Tender along the right upper and lower abdomen. Abdomen is soft, bowel sounds are active. No guarding.   Musculoskeletal: Normal range of motion.  Neurological: She is alert.  Skin: No pallor.  Psychiatric: She has a normal mood and affect. Her behavior is normal.  Nursing note and vitals reviewed.  ED Treatments / Results  DIAGNOSTIC STUDIES: Oxygen Saturation is 98% on RA, normal by my interpretation.   COORDINATION OF CARE: 11:51 PM-Discussed next steps with pt. Pt verbalized understanding and is agreeable with the plan.   Labs (all labs ordered are listed, but only abnormal results are displayed) Labs Reviewed  COMPREHENSIVE METABOLIC PANEL  CBC  POC OCCULT BLOOD, ED  TYPE AND SCREEN   EKG  EKG Interpretation None      Radiology No results found.  Procedures Procedures   Medications Ordered in ED Medications - No data to display  Initial Impression / Assessment and Plan / ED Course  I have reviewed the triage vital signs  and the nursing notes.  Pertinent labs & imaging results that were available during my care of the patient were reviewed by me and considered in my medical decision making (see chart for details).     Patient with history of ischemic colitis presenting as abdominal cramping and profuse rectal bleeding is here today with similar symptoms. No fever, nausea or vomiting. Symptoms started this am.   Chart reviewed. Admitted in 2013 with ischemic colitis, anemia from lower GI bleed (no transfusion ultimately required). Today with guaiac positive stools. CT scan ordered and is pending. Pain addressed with IV Morphine.  CT shows descending colon wall thickening and soft tissue inflammation. Given her complicated GI history, lower GI bleeding, abnormal CT findings, symptoms and leukocytosis, feel admission to be standard of care. Discussed with Fuller Plan who accepts the patient onto his service.   Final Clinical Impressions(s) / ED Diagnoses   Final diagnoses:  None   1. Colitis 2. Lower GI bleeding  New Prescriptions New Prescriptions   No medications on file   I personally performed the services described in this documentation, which was scribed in my presence. The recorded information has been reviewed and is accurate.     Charlann Lange, PA-C 10/10/16 [place], MD 10/12/16 2143

## 2016-09-24 ENCOUNTER — Encounter (HOSPITAL_COMMUNITY): Payer: Self-pay | Admitting: Radiology

## 2016-09-24 ENCOUNTER — Emergency Department (HOSPITAL_COMMUNITY): Payer: Medicare Other

## 2016-09-24 DIAGNOSIS — K922 Gastrointestinal hemorrhage, unspecified: Secondary | ICD-10-CM | POA: Diagnosis not present

## 2016-09-24 DIAGNOSIS — R933 Abnormal findings on diagnostic imaging of other parts of digestive tract: Secondary | ICD-10-CM | POA: Diagnosis not present

## 2016-09-24 DIAGNOSIS — K5010 Crohn's disease of large intestine without complications: Secondary | ICD-10-CM | POA: Diagnosis not present

## 2016-09-24 DIAGNOSIS — K8681 Exocrine pancreatic insufficiency: Secondary | ICD-10-CM | POA: Diagnosis not present

## 2016-09-24 DIAGNOSIS — G4733 Obstructive sleep apnea (adult) (pediatric): Secondary | ICD-10-CM | POA: Diagnosis not present

## 2016-09-24 DIAGNOSIS — N183 Chronic kidney disease, stage 3 (moderate): Secondary | ICD-10-CM | POA: Diagnosis not present

## 2016-09-24 DIAGNOSIS — K921 Melena: Secondary | ICD-10-CM | POA: Diagnosis not present

## 2016-09-24 DIAGNOSIS — E860 Dehydration: Secondary | ICD-10-CM | POA: Diagnosis not present

## 2016-09-24 DIAGNOSIS — K635 Polyp of colon: Secondary | ICD-10-CM | POA: Diagnosis not present

## 2016-09-24 DIAGNOSIS — R109 Unspecified abdominal pain: Secondary | ICD-10-CM | POA: Diagnosis not present

## 2016-09-24 DIAGNOSIS — Z9989 Dependence on other enabling machines and devices: Secondary | ICD-10-CM

## 2016-09-24 DIAGNOSIS — K861 Other chronic pancreatitis: Secondary | ICD-10-CM | POA: Diagnosis not present

## 2016-09-24 DIAGNOSIS — M797 Fibromyalgia: Secondary | ICD-10-CM

## 2016-09-24 DIAGNOSIS — Z8719 Personal history of other diseases of the digestive system: Secondary | ICD-10-CM | POA: Insufficient documentation

## 2016-09-24 DIAGNOSIS — K640 First degree hemorrhoids: Secondary | ICD-10-CM | POA: Diagnosis present

## 2016-09-24 DIAGNOSIS — K6389 Other specified diseases of intestine: Secondary | ICD-10-CM | POA: Diagnosis not present

## 2016-09-24 DIAGNOSIS — D72829 Elevated white blood cell count, unspecified: Secondary | ICD-10-CM

## 2016-09-24 DIAGNOSIS — K5730 Diverticulosis of large intestine without perforation or abscess without bleeding: Secondary | ICD-10-CM | POA: Diagnosis not present

## 2016-09-24 DIAGNOSIS — F419 Anxiety disorder, unspecified: Secondary | ICD-10-CM | POA: Diagnosis not present

## 2016-09-24 DIAGNOSIS — K598 Other specified functional intestinal disorders: Secondary | ICD-10-CM | POA: Diagnosis not present

## 2016-09-24 DIAGNOSIS — N179 Acute kidney failure, unspecified: Secondary | ICD-10-CM | POA: Diagnosis not present

## 2016-09-24 DIAGNOSIS — D649 Anemia, unspecified: Secondary | ICD-10-CM | POA: Diagnosis not present

## 2016-09-24 DIAGNOSIS — Z6841 Body Mass Index (BMI) 40.0 and over, adult: Secondary | ICD-10-CM | POA: Diagnosis not present

## 2016-09-24 DIAGNOSIS — R1084 Generalized abdominal pain: Secondary | ICD-10-CM | POA: Diagnosis not present

## 2016-09-24 DIAGNOSIS — I776 Arteritis, unspecified: Secondary | ICD-10-CM | POA: Diagnosis present

## 2016-09-24 DIAGNOSIS — R197 Diarrhea, unspecified: Secondary | ICD-10-CM | POA: Diagnosis not present

## 2016-09-24 DIAGNOSIS — K219 Gastro-esophageal reflux disease without esophagitis: Secondary | ICD-10-CM | POA: Diagnosis not present

## 2016-09-24 DIAGNOSIS — K559 Vascular disorder of intestine, unspecified: Secondary | ICD-10-CM | POA: Diagnosis not present

## 2016-09-24 DIAGNOSIS — K50111 Crohn's disease of large intestine with rectal bleeding: Secondary | ICD-10-CM | POA: Diagnosis not present

## 2016-09-24 DIAGNOSIS — K529 Noninfective gastroenteritis and colitis, unspecified: Secondary | ICD-10-CM | POA: Diagnosis not present

## 2016-09-24 DIAGNOSIS — G8929 Other chronic pain: Secondary | ICD-10-CM | POA: Diagnosis present

## 2016-09-24 DIAGNOSIS — R935 Abnormal findings on diagnostic imaging of other abdominal regions, including retroperitoneum: Secondary | ICD-10-CM | POA: Diagnosis present

## 2016-09-24 DIAGNOSIS — I129 Hypertensive chronic kidney disease with stage 1 through stage 4 chronic kidney disease, or unspecified chronic kidney disease: Secondary | ICD-10-CM | POA: Diagnosis not present

## 2016-09-24 DIAGNOSIS — R7302 Impaired glucose tolerance (oral): Secondary | ICD-10-CM | POA: Diagnosis not present

## 2016-09-24 HISTORY — DX: Personal history of other diseases of the digestive system: Z87.19

## 2016-09-24 LAB — CBC
HCT: 34.9 % — ABNORMAL LOW (ref 36.0–46.0)
HEMOGLOBIN: 11.4 g/dL — AB (ref 12.0–15.0)
MCH: 29.8 pg (ref 26.0–34.0)
MCHC: 32.7 g/dL (ref 30.0–36.0)
MCV: 91.1 fL (ref 78.0–100.0)
Platelets: 235 10*3/uL (ref 150–400)
RBC: 3.83 MIL/uL — AB (ref 3.87–5.11)
RDW: 13.8 % (ref 11.5–15.5)
WBC: 11.3 10*3/uL — AB (ref 4.0–10.5)

## 2016-09-24 LAB — TYPE AND SCREEN
ABO/RH(D): B POS
Antibody Screen: NEGATIVE

## 2016-09-24 LAB — LACTIC ACID, PLASMA: LACTIC ACID, VENOUS: 1.2 mmol/L (ref 0.5–1.9)

## 2016-09-24 LAB — COMPREHENSIVE METABOLIC PANEL
ALBUMIN: 3.7 g/dL (ref 3.5–5.0)
ALT: 13 U/L — ABNORMAL LOW (ref 14–54)
AST: 14 U/L — AB (ref 15–41)
Alkaline Phosphatase: 35 U/L — ABNORMAL LOW (ref 38–126)
Anion gap: 9 (ref 5–15)
BUN: 19 mg/dL (ref 6–20)
CHLORIDE: 108 mmol/L (ref 101–111)
CO2: 22 mmol/L (ref 22–32)
CREATININE: 1.1 mg/dL — AB (ref 0.44–1.00)
Calcium: 8.6 mg/dL — ABNORMAL LOW (ref 8.9–10.3)
GFR calc Af Amer: >60 mL/min (ref >60)
GFR, EST NON AFRICAN AMERICAN: 56 mL/min — AB (ref >60)
GLUCOSE: 96 mg/dL (ref 65–99)
Potassium: 3.7 mmol/L (ref 3.5–5.1)
SODIUM: 139 mmol/L (ref 135–145)
Total Bilirubin: 0.8 mg/dL (ref 0.3–1.2)
Total Protein: 6.9 g/dL (ref 6.5–8.1)

## 2016-09-24 LAB — HEMOGLOBIN AND HEMATOCRIT, BLOOD
HCT: 35.3 % — ABNORMAL LOW (ref 36.0–46.0)
Hemoglobin: 11.5 g/dL — ABNORMAL LOW (ref 12.0–15.0)

## 2016-09-24 LAB — HIV ANTIBODY (ROUTINE TESTING W REFLEX): HIV SCREEN 4TH GENERATION: NONREACTIVE

## 2016-09-24 LAB — LIPASE, BLOOD: LIPASE: 33 U/L (ref 11–51)

## 2016-09-24 MED ORDER — PANTOPRAZOLE SODIUM 40 MG IV SOLR
40.0000 mg | Freq: Two times a day (BID) | INTRAVENOUS | Status: DC
  Administered 2016-09-24 – 2016-09-29 (×11): 40 mg via INTRAVENOUS
  Filled 2016-09-24 (×11): qty 40

## 2016-09-24 MED ORDER — ONDANSETRON HCL 4 MG PO TABS: 4.0000 mg | ORAL_TABLET | Freq: Four times a day (QID) | ORAL | Status: DC | PRN

## 2016-09-24 MED ORDER — ONDANSETRON HCL 4 MG/2ML IJ SOLN
4.0000 mg | Freq: Four times a day (QID) | INTRAMUSCULAR | Status: DC | PRN
  Administered 2016-09-25 – 2016-09-26 (×2): 4 mg via INTRAVENOUS
  Filled 2016-09-24 (×2): qty 2

## 2016-09-24 MED ORDER — CIPROFLOXACIN IN D5W 400 MG/200ML IV SOLN
400.0000 mg | Freq: Once | INTRAVENOUS | Status: AC
  Administered 2016-09-24: 400 mg via INTRAVENOUS
  Filled 2016-09-24: qty 200

## 2016-09-24 MED ORDER — OLOPATADINE HCL 0.1 % OP SOLN
1.0000 [drp] | Freq: Every day | OPHTHALMIC | Status: DC
  Administered 2016-09-24 – 2016-09-26 (×3): 1 [drp] via OPHTHALMIC
  Filled 2016-09-24 (×2): qty 5

## 2016-09-24 MED ORDER — MORPHINE SULFATE (PF) 10 MG/ML IV SOLN: 4.0000 mg | INTRAVENOUS | Status: DC | PRN

## 2016-09-24 MED ORDER — SODIUM CHLORIDE 0.9 % IV BOLUS (SEPSIS)
1000.0000 mL | Freq: Once | INTRAVENOUS | Status: AC
  Administered 2016-09-24: 1000 mL via INTRAVENOUS

## 2016-09-24 MED ORDER — METRONIDAZOLE IN NACL 5-0.79 MG/ML-% IV SOLN
500.0000 mg | Freq: Once | INTRAVENOUS | Status: AC
  Administered 2016-09-24: 500 mg via INTRAVENOUS
  Filled 2016-09-24: qty 100

## 2016-09-24 MED ORDER — SODIUM CHLORIDE 0.9 % IV BOLUS (SEPSIS)
500.0000 mL | Freq: Once | INTRAVENOUS | Status: AC
  Administered 2016-09-24: 500 mL via INTRAVENOUS

## 2016-09-24 MED ORDER — CIPROFLOXACIN IN D5W 400 MG/200ML IV SOLN
400.0000 mg | Freq: Two times a day (BID) | INTRAVENOUS | Status: DC
  Administered 2016-09-24 – 2016-09-29 (×11): 400 mg via INTRAVENOUS
  Filled 2016-09-24 (×11): qty 200

## 2016-09-24 MED ORDER — MORPHINE SULFATE (PF) 10 MG/ML IV SOLN
6.0000 mg | Freq: Once | INTRAVENOUS | Status: AC
Start: 2016-09-24 — End: 2016-09-24
  Administered 2016-09-24: 6 mg via INTRAVENOUS
  Filled 2016-09-24: qty 1

## 2016-09-24 MED ORDER — FLUTICASONE PROPIONATE 50 MCG/ACT NA SUSP
2.0000 | Freq: Every day | NASAL | Status: DC
Start: 2016-09-24 — End: 2016-09-29
  Administered 2016-09-24 – 2016-09-26 (×3): 2 via NASAL
  Filled 2016-09-24 (×2): qty 16

## 2016-09-24 MED ORDER — METRONIDAZOLE IN NACL 5-0.79 MG/ML-% IV SOLN
500.0000 mg | Freq: Three times a day (TID) | INTRAVENOUS | Status: DC
  Administered 2016-09-24 – 2016-09-29 (×16): 500 mg via INTRAVENOUS
  Filled 2016-09-24 (×15): qty 100

## 2016-09-24 MED ORDER — ACETAMINOPHEN 650 MG RE SUPP: 650.0000 mg | Freq: Four times a day (QID) | RECTAL | Status: DC | PRN

## 2016-09-24 MED ORDER — IOPAMIDOL (ISOVUE-300) INJECTION 61%
80.0000 mL | Freq: Once | INTRAVENOUS | Status: AC | PRN
  Administered 2016-09-24: 80 mL via INTRAVENOUS

## 2016-09-24 MED ORDER — LORAZEPAM 2 MG/ML IJ SOLN: 0.5000 mg | Freq: Four times a day (QID) | INTRAMUSCULAR | Status: DC | PRN

## 2016-09-24 MED ORDER — MORPHINE SULFATE (PF) 2 MG/ML IV SOLN
4.0000 mg | INTRAVENOUS | Status: DC | PRN
  Administered 2016-09-24 (×2): 4 mg via INTRAVENOUS
  Filled 2016-09-24 (×2): qty 2

## 2016-09-24 MED ORDER — ACETAMINOPHEN 325 MG PO TABS
650.0000 mg | ORAL_TABLET | Freq: Four times a day (QID) | ORAL | Status: DC | PRN
  Administered 2016-09-26: 650 mg via ORAL
  Filled 2016-09-24: qty 2

## 2016-09-24 MED ORDER — MORPHINE SULFATE (PF) 10 MG/ML IV SOLN: 2.0000 mg | INTRAVENOUS | Status: DC | PRN

## 2016-09-24 MED ORDER — IOPAMIDOL (ISOVUE-300) INJECTION 61%
INTRAVENOUS | Status: AC
  Filled 2016-09-24: qty 100

## 2016-09-24 MED ORDER — SODIUM CHLORIDE 0.9 % IV SOLN
Freq: Once | INTRAVENOUS | Status: AC
  Administered 2016-09-24: via INTRAVENOUS

## 2016-09-24 MED ORDER — SODIUM CHLORIDE 0.9 % IV SOLN
INTRAVENOUS | Status: DC
  Administered 2016-09-24 – 2016-09-25 (×2): via INTRAVENOUS

## 2016-09-24 MED ORDER — MORPHINE SULFATE (PF) 4 MG/ML IV SOLN
4.0000 mg | INTRAVENOUS | Status: DC | PRN
  Administered 2016-09-24 – 2016-09-29 (×16): 4 mg via INTRAVENOUS
  Filled 2016-09-24 (×17): qty 1

## 2016-09-24 NOTE — Progress Notes (Signed)
Senior veterans this morning. Admitting H&P reviewed and noted. Vital signs as well as lab work reviewed and noted. No bright red blood per rectum this morning. Hemodynamics stable. Dr. Paulita Fujita consulted by me this morning CT scan abdomen report with questionable inflammatory process noted

## 2016-09-24 NOTE — Progress Notes (Signed)
Pt has CPAP machine from home and wishes to utilize tonight.  RT inspected for damages and none were found.  Pt's machine is working properly at this time.  RT to monitor and assess as needed.

## 2016-09-24 NOTE — H&P (Signed)
History and Physical    Valerie [name] UTM:[id number] DOB: 02/04/1961 DOA: 09/23/2016  Referring MD/NP/PA: Calton Golds PCP: Delman Cheadle, MD  Patient coming from: Home  Chief Complaint:  Rectal bleeding  HPI: Valerie [name] is a 56 y.o. female with medical history significant of recurrent pancreatitis, recurrent UTIs, CKD stage III, H/O ischemic colitis, chronic pain, OSA on CPAP; who presents with complaints of rectal bleeding since yesterday at noon. Reports acute onset of crampy lower abdominal pain that was constant with urge to have bowel movement. The patient was diaphoretic and lightheaded. The initial bowel movement was brown and normally formed. Shortly thereafter patient noted subsequent bowel movements which were loose with bright red blood blood around the stool looked like ketchup. Associated symptoms include generalized weakness and nausea. Denies any chest pain, shortness of breath, vomiting, dysuria, or fever. Patient took MS Contin for pain and reports taking Phenergan earlier in the day for nausea symptoms. Patient reports having at least 7 bloody bowel movements prior to arrival. Patient notes similar symptoms back in 2013 when she was diagnosed with ischemic colitis. She is followed by Dr. Amedeo Plenty of gastroenterology outpatient setting. Prior to arrival Dr. Paulita Fujita on-call GI was contacted.   ED Course: Upon admission into into the emergency department patient was found to be afebrile, pulse 62-105, respirations 10-18, and all other vital signs relatively maintained. Labs revealed WBC 16.7, BUN 23, creatinine 1.4. Patient was found to be stool guiac positive. CT of the abdomen showed signs of thickening along the descending colon suggestive of infectious/inflammatory process.  Review of Systems: As per HPI otherwise 10 point review of systems negative.   Past Medical History:  Diagnosis Date  . Acute ischemic colitis ([place])   . Anemia 05/01/2012  . Anxiety   . ARF (acute renal  failure) ([place]) 12/28/2015  . Arthritis    "basically ~ q joint" (12/28/2015)  . Asthma   . Basal cell carcinoma of upper back ~ 2011  . Chronic lower back pain   . DDD (degenerative disc disease), lumbar   . Diarrhea    EPISODIC  . Fibromyalgia   . First degree heart block   . Gastro-esophageal reflux   . Headache(784.0)    "muscular w/migraine intensity; ~ 5-6 months" (12/28/2015)  . Hypertension   . Mental disorder   . Nerve pain    nerve damage to right arm  . Neuromuscular disorder ([place])   . Pancreatitis   . Pneumonia ~ 2014; 08/2015  . Polymyalgia rheumatica ([place]) onset 08/2015  . Precancerous melanosis    "2nd toe left foot; 5th toe of right foot; right temple; had them all cut off"  . Sleep apnea    CPAP-9    Past Surgical History:  Procedure Laterality Date  . BASAL CELL CARCINOMA EXCISION  ~ 2011   back  . BOTOX INJECTION N/A 11/06/2012   Procedure: BOTOX INJECTION;  Surgeon: Missy Sabins, MD;  Location: [facility] ENDOSCOPY;  Service: Endoscopy;  Laterality: N/A;  . BOTOX INJECTION N/A 12/04/2013   Procedure: BOTOX INJECTION;  Surgeon: Missy Sabins, MD;  Location: [place];  Service: Endoscopy;  Laterality: N/A;  . BOTOX INJECTION N/A 02/26/2014   Procedure: BOTOX INJECTION;  Surgeon: Missy Sabins, MD;  Location: [place];  Service: Endoscopy;  Laterality: N/A;  . BOTOX INJECTION N/A 07/08/2015   Procedure: BOTOX INJECTION;  Surgeon: Teena Irani, MD;  Location: [facility];  Service: Endoscopy;  Laterality: N/A;  . BOTOX INJECTION N/A 04/27/2016  Procedure: BOTOX INJECTION;  Surgeon: Teena Irani, MD;  Location: [facility];  Service: Endoscopy;  Laterality: N/A;  . ESOPHAGOGASTRODUODENOSCOPY  12/21/2011   Procedure: ESOPHAGOGASTRODUODENOSCOPY (EGD);  Surgeon: Missy Sabins, MD;  Location: [facility] ENDOSCOPY;  Service: Endoscopy;  Laterality: N/A;  . ESOPHAGOGASTRODUODENOSCOPY N/A 11/06/2012   Procedure: ESOPHAGOGASTRODUODENOSCOPY (EGD);  Surgeon: Missy Sabins, MD;  Location: [facility]  END[facility];  Service: Endoscopy;  Laterality: N/A;  . ESOPHAGOGASTRODUODENOSCOPY N/A 12/04/2013   Procedure: ESOPHAGOGASTRODUODENOSCOPY (EGD);  Surgeon: Missy Sabins, MD;  Location: [facility] ENDOSCOPY;  Service: Endoscopy;  Laterality: N/A;  . ESOPHAGOGASTRODUODENOSCOPY N/A 02/26/2014   Procedure: ESOPHAGOGASTRODUODENOSCOPY (EGD);  Surgeon: Missy Sabins, MD;  Location: [facility] ENDOSCOPY;  Service: Endoscopy;  Laterality: N/A;  . ESOPHAGOGASTRODUODENOSCOPY (EGD) WITH PROPOFOL N/A 07/08/2015   Procedure: ESOPHAGOGASTRODUODENOSCOPY (EGD) WITH PROPOFOL;  Surgeon: Teena Irani, MD;  Location: [facility] ENDOSCOPY;  Service: Endoscopy;  Laterality: N/A;  . ESOPHAGOGASTRODUODENOSCOPY (EGD) WITH PROPOFOL N/A 04/27/2016   Procedure: ESOPHAGOGASTRODUODENOSCOPY (EGD) WITH PROPOFOL;  Surgeon: Teena Irani, MD;  Location: [facility] ENDOSCOPY;  Service: Endoscopy;  Laterality: N/A;  . FLEXIBLE SIGMOIDOSCOPY  05/01/2012   Procedure: FLEXIBLE SIGMOIDOSCOPY;  Surgeon: Jeryl Columbia, MD;  Location: [facility] ENDOSCOPY;  Service: Endoscopy;  Laterality: N/A;  unprepped  . LAPAROSCOPIC CHOLECYSTECTOMY  01/2002  . NASAL SEPTOPLASTY W/ TURBINOPLASTY  1998  . SHOULDER ARTHROSCOPY WITH ROTATOR CUFF REPAIR AND OPEN BICEPS TENODESIS Bilateral 2010-2013   left-right     reports that she has never smoked. She has never used smokeless tobacco. She reports that she does not drink alcohol or use drugs.  Allergies  Allergen Reactions  . Dilaudid [Hydromorphone Hcl] Other (See Comments)    Headache   . Pregabalin Other (See Comments)    Drug induced stupor  . Metoclopramide Hcl Other (See Comments)    "crawling inside"  . Neurontin [Gabapentin] Other (See Comments)    Tremors   . Other Other (See Comments)    Avoid all SSRI's and SNRI's- damaged pancreas    Family History  Problem Relation Age of Onset  . Heart disease Mother   . COPD Mother   . Arthritis Mother   . Cancer Mother   . Hypertension Mother   . Arthritis Paternal Grandmother   . Heart  disease Paternal Grandmother   . Heart disease Paternal Grandfather     Prior to Admission medications   Medication Sig Start Date End Date Taking? Authorizing Provider  allopurinol (ZYLOPRIM) 300 MG tablet Take 0.5 tablets (150 mg total) by mouth daily. 07/31/16  Yes Shawnee Knapp, MD  ALPRAZolam Duanne Moron) 1 MG tablet Take 1 tablet (1 mg total) by mouth 3 (three) times daily as needed for anxiety. 07/31/16  Yes Shawnee Knapp, MD  augmented betamethasone dipropionate (DIPROLENE-AF) 0.05 % cream Apply topically 2 (two) times daily. Patient taking differently: Apply 1 application topically 2 (two) times daily as needed (palm).  08/02/16  Yes Shawnee Knapp, MD  baclofen (LIORESAL) 10 MG tablet TAKE 1 TABLET (10 MG TOTAL) BY MOUTH 3 (THREE TIMES DAILY) Patient taking differently: Take 10 mg by mouth 3 (three) times daily as needed for muscle spasms.  01/12/16  Yes Darlyne Russian, MD  benazepril (LOTENSIN) 40 MG tablet Take 1 tablet (40 mg total) by mouth daily. 02/18/16  Yes Darlyne Russian, MD  Biotin 5000 MCG CAPS Take 10,000 mcg by mouth daily.    Yes Historical Provider, MD  calcium carbonate (OSCAL) 1500 (600 Ca) MG TABS tablet Take 600 mg of  elemental calcium by mouth daily with breakfast.   Yes Historical Provider, MD  cetirizine (ZYRTEC) 10 MG tablet Take 10 mg by mouth daily. Alternates between Allegra and Zyrtec as needed for allergies   Yes Historical Provider, MD  dexlansoprazole (DEXILANT) 60 MG capsule Take 60 mg by mouth 2 (two) times daily.   Yes Historical Provider, MD  fexofenadine (ALLEGRA) 180 MG tablet Take 180 mg by mouth daily as needed. Switches between Allegra and Zyrtec as needed for allergies   Yes Historical Provider, MD  fluticasone (FLONASE) 50 MCG/ACT nasal spray Place 2 sprays into both nostrils daily. 07/31/16  Yes Shawnee Knapp, MD  HYDROcodone-acetaminophen (NORCO) 10-325 MG per tablet Take 1-2 tablets by mouth every 6 (six) hours as needed for moderate pain (pain.).    Yes Historical  Provider, MD  metoprolol succinate (TOPROL-XL) 100 MG 24 hr tablet Take 1 tablet (100 mg total) by mouth daily. Take with or immediately following a meal. 07/31/16  Yes Shawnee Knapp, MD  modafinil (PROVIGIL) 200 MG tablet Take 0.5 tablets (100 mg total) by mouth 2 (two) times daily. 12/30/15  Yes Carmen Dohmeier, MD  montelukast (SINGULAIR) 10 MG tablet TAKE 1 TABLET (10 MG TOTAL) BY MOUTH AT BEDTIME 07/31/16  Yes Shawnee Knapp, MD  morphine (MS CONTIN) 30 MG 12 hr tablet Take 30 mg by mouth every 8 (eight) hours. Not from [facility] 09/23/12  Yes Historical Provider, MD  Multiple Vitamins-Minerals (MULTIVITAMIN PO) Take 1 tablet by mouth daily.   Yes Historical Provider, MD  Olopatadine HCl (PAZEO) 0.7 % SOLN Apply 1 drop to eye daily. 08/02/16  Yes Shawnee Knapp, MD  Pancrelipase, Lip-Prot-Amyl, (CREON) 24000 UNITS CPEP Take 72,000 Units by mouth 3 (three) times daily as needed (for pancreatitis).    Yes Historical Provider, MD  PROAIR HFA 108 (90 Base) MCG/ACT inhaler INHALE 2 PUFFS INTO THE LUNGS AS NEEDED FOR WHEEZING OR SHORTNESS OF BREATH. 05/29/16  Yes Shawnee Knapp, MD  promethazine (PHENERGAN) 25 MG tablet Take 1 tablet (25 mg total) by mouth every 6 (six) hours as needed for nausea or vomiting. 07/31/16  Yes Shawnee Knapp, MD  spironolactone (ALDACTONE) 100 MG tablet Take 1 tablet (100 mg total) by mouth daily. 07/31/16  Yes Shawnee Knapp, MD  sucralfate (CARAFATE) 1 GM/10ML suspension Take 10 mLs (1 g total) by mouth 4 (four) times daily as needed (stomach pain). 07/31/16  Yes Shawnee Knapp, MD  predniSONE (DELTASONE) 1 MG tablet Take 4 mg by mouth daily with breakfast.    Historical Provider, MD    Physical Exam: Constitutional: Obese female in mild discomfort Vitals:   09/23/16 2234 09/24/16 0202  BP: (!) 142/96 (!) 99/58  Pulse: (!) 105 62  Resp: 18 10  Temp: 98.5 F (36.9 C)   TempSrc: Oral   SpO2: 98% 100%   Eyes: PERRL, lids and conjunctivae normal ENMT: Mucous membranes are moist. Posterior pharynx clear  of any exudate or lesions.Normal dentition.  Neck: normal, supple, no masses, no thyromegaly Respiratory: clear to auscultation bilaterally, no wheezing, no crackles. Normal respiratory effort. No accessory muscle use.  Cardiovascular: Regular rate and rhythm, no murmurs / rubs / gallops. No extremity edema. 2+ pedal pulses. No carotid bruits.  Abdomen: Tenderness to palpation along the right flank, no masses palpated. No hepatosplenomegaly. Bowel sounds positive.  Musculoskeletal: no clubbing / cyanosis. No joint deformity upper and lower extremities. Good ROM, no contractures. Normal muscle tone.  Skin: no rashes, lesions,  ulcers. No induration Neurologic: CN 2-12 grossly intact. Sensation intact, DTR normal. Strength 5/5 in all 4.  Psychiatric: Normal judgment and insight. Alert and oriented x 3. Normal mood.     Labs on Admission: I have personally reviewed following labs and imaging studies  CBC:  Recent Labs Lab 09/23/16 2305  WBC 16.7*  HGB 14.0  HCT 41.7  MCV 90.7  PLT 161   Basic Metabolic Panel:  Recent Labs Lab 09/18/16 1607 09/23/16 2305  NA 143 137  K 4.8 3.8  CL 103 105  CO2 19 18*  GLUCOSE 109* 142*  BUN 19 23*  CREATININE 1.06* 1.40*  CALCIUM 9.9 10.0   GFR: Estimated Creatinine Clearance: 62.9 mL/min (A) (by C-G formula based on SCr of 1.4 mg/dL (H)). Liver Function Tests:  Recent Labs Lab 09/18/16 1607 09/23/16 2305  AST 12 21  ALT 12 17  ALKPHOS 57 45  BILITOT <0.2 0.8  PROT 7.6 8.5*  ALBUMIN 4.5 4.7    Recent Labs Lab 09/18/16 1607  LIPASE 68   No results for input(s): AMMONIA in the last 168 hours. Coagulation Profile: No results for input(s): INR, PROTIME in the last 168 hours. Cardiac Enzymes: No results for input(s): CKTOTAL, CKMB, CKMBINDEX, TROPONINI in the last 168 hours. BNP (last 3 results) No results for input(s): PROBNP in the last 8760 hours. HbA1C: No results for input(s): HGBA1C in the last 72 hours. CBG: No  results for input(s): GLUCAP in the last 168 hours. Lipid Profile: No results for input(s): CHOL, HDL, LDLCALC, TRIG, CHOLHDL, LDLDIRECT in the last 72 hours. Thyroid Function Tests: No results for input(s): TSH, T4TOTAL, FREET4, T3FREE, THYROIDAB in the last 72 hours. Anemia Panel: No results for input(s): VITAMINB12, FOLATE, FERRITIN, TIBC, IRON, RETICCTPCT in the last 72 hours. Urine analysis:    Component Value Date/Time   COLORURINE YELLOW 12/28/2015 1100   APPEARANCEUR CLOUDY (A) 12/28/2015 1100   LABSPEC 1.016 12/28/2015 1100   PHURINE 5.5 12/28/2015 1100   GLUCOSEU NEGATIVE 12/28/2015 1100   HGBUR MODERATE (A) 12/28/2015 1100   BILIRUBINUR negative 09/18/2016 1517   BILIRUBINUR negative 12/27/2014 0942   KETONESUR negative 09/18/2016 1517   KETONESUR 40 (A) 12/28/2015 1100   PROTEINUR negative 09/18/2016 1517   PROTEINUR NEGATIVE 12/28/2015 1100   UROBILINOGEN 0.2 09/18/2016 1517   UROBILINOGEN 0.2 02/03/2014 2022   NITRITE Negative 09/18/2016 1517   NITRITE NEGATIVE 12/28/2015 1100   LEUKOCYTESUR Negative 09/18/2016 1517   Sepsis Labs: Recent Results (from the past 240 hour(s))  Urine culture     Status: None   Collection Time: 09/18/16  4:07 PM  Result Value Ref Range Status   Urine Culture, Routine Final report  Final   Urine Culture result 1 No growth  Final     Radiological Exams on Admission: Ct Abdomen Pelvis W Contrast  Result Date: 09/24/2016 CLINICAL DATA:  Acute onset of abdominal pain and cramping. Dizziness and near syncope. Bright red blood per rectum. Initial encounter. EXAM: CT ABDOMEN AND PELVIS WITH CONTRAST TECHNIQUE: Multidetector CT imaging of the abdomen and pelvis was performed using the standard protocol following bolus administration of intravenous contrast. CONTRAST:  25mL ISOVUE-300 IOPAMIDOL (ISOVUE-300) INJECTION 61% COMPARISON:  CT of the abdomen pelvis from 06/16/2015, and MRCP performed 07/27/2015 FINDINGS: Lower chest: The visualized  lung bases are grossly clear. The visualized portions of the mediastinum are unremarkable. Hepatobiliary: The liver is unremarkable in appearance. The patient is status post cholecystectomy, with clips noted at the gallbladder fossa. The  common bile duct remains normal in caliber. Pancreas: The pancreas is within normal limits. Spleen: The spleen is unremarkable in appearance. Adrenals/Urinary Tract: The adrenal glands are unremarkable in appearance. The kidneys are within normal limits. There is no evidence of hydronephrosis. No renal or ureteral stones are identified. No perinephric stranding is seen. Stomach/Bowel: The stomach is unremarkable in appearance. The small bowel is within normal limits. The appendix is normal in caliber, without evidence of appendicitis. Mild wall thickening is noted along the descending colon, with minimal associated soft tissue inflammation, raising question for a mild infectious or inflammatory process. Vascular/Lymphatic: The abdominal aorta is unremarkable in appearance. The inferior vena cava is grossly unremarkable. No retroperitoneal lymphadenopathy is seen. No pelvic sidewall lymphadenopathy is identified. Reproductive: The bladder is mildly distended and within normal limits. The uterus is grossly unremarkable in appearance. The ovaries are relatively symmetric. No suspicious adnexal masses are seen. Other: No additional soft tissue abnormalities are seen. Musculoskeletal: No acute osseous abnormalities are identified. The visualized musculature is unremarkable in appearance. IMPRESSION: Mild wall thickening along the descending colon, with minimal associated soft tissue inflammation, raising question for a mild infectious or inflammatory process. Electronically Signed   By: Garald Balding M.D.   On: 09/24/2016 01:32    EKG: Independently reviewed: Showing normal sinus rhythm  Assessment/Plan  Colitis of the descending colon, with rectal bleeding:Acute. Patient presents  with rectal bleeding found to be guaiac positive. CT scan thickening of the descending colon. Question inflammatory versus infectious. Patient is followed by Dr. Amedeo Plenty of gastroenterology as an outpatient and has had previous history of ischemic colitis. - Admit to a telemetry bed - Serial monitoring of H&H - T&S for possible need of blood products - Continue antibiotics of ciprofloxacin and Flagyl - IV fluids of normal saline at 100 ml/hr - Continue antibiotics of ciprofloxacin and metronidazole - Morphine IV prn pain - NPO - will need to consult GI formally in a.m.    Leukocytosis WBC elevated at 16.7 suspect secondary to above. Lactic acid noted to be only 1.2. - Follow-up repeat CBC  Acute kidney injury: The patient's baseline creatinine is noted to be around 1, but patient presents with a creatinine of 1.4 and BUN of 23. - Continuous IVF  - Recheck BMP  Anxiety - Xanax changed to Ativan IV  Chronic pain with high-risk medication use: Patient with history of fibromyalgia on scheduled MS Contin and hydrocodone at home. -  Currently NPO will need to restart home medications 1 able  OSA on CPAP  DVT prophylaxis:SCD Code Status: Full Family Communication:  No family present at bedside Disposition Plan: Likely discharge home once medically stable  Consults called: none  Admission status:  Inpatiet   Norval Morton MD [facility] Pager [phone]  If 7PM-7AM, please contact night-coverage [email or web address] Password [facility]  09/24/2016, 2:53 AM

## 2016-09-24 NOTE — Consult Note (Signed)
[facility] Consultation Note  Referring Provider: Dr. Vista Lawman Primary Care Physician:  Delman Cheadle, MD Primary Gastroenterologist:  Dr. Teena Irani  Reason for Consultation:  Blood in stool  HPI: [name] is a 56 y.o. female with morbid obesity and multiple associated medical problems.  She is followed for GI issues by Dr. Teena Irani.  Patient in static state of GI health until yesterday around lunch time.  At that time, she developed lower abdominal pain followed shortly thereafter by frank blood in stool.  She had colonoscopy about 6 months ago showing few sigmoid diverticula, otherwise unrevealing.  She had similar presentation in 2013, had sigmoidoscopy showing changes consistent with ischemic colitis, which were corroborated on biopsies.  No blood thinners.  Since her admission last night, her abdominal pain persists, but she has had no further blood in stool.   Past Medical History:  Diagnosis Date  . Acute ischemic colitis ([place])   . Anemia 05/01/2012  . Anxiety   . ARF (acute renal failure) ([place]) 12/28/2015  . Arthritis    "basically ~ q joint" (12/28/2015)  . Asthma   . Basal cell carcinoma of upper back ~ 2011  . Chronic lower back pain   . DDD (degenerative disc disease), lumbar   . Diarrhea    EPISODIC  . Fibromyalgia   . First degree heart block   . Gastro-esophageal reflux   . Headache(784.0)    "muscular w/migraine intensity; ~ 5-6 months" (12/28/2015)  . Hypertension   . Mental disorder   . Nerve pain    nerve damage to right arm  . Neuromuscular disorder ([place])   . Pancreatitis   . Pneumonia ~ 2014; 08/2015  . Polymyalgia rheumatica ([place]) onset 08/2015  . Precancerous melanosis    "2nd toe left foot; 5th toe of right foot; right temple; had them all cut off"  . Sleep apnea    CPAP-9    Past Surgical History:  Procedure Laterality Date  . BASAL CELL CARCINOMA EXCISION  ~ 2011   back  . BOTOX INJECTION N/A 11/06/2012   Procedure: BOTOX  INJECTION;  Surgeon: Missy Sabins, MD;  Location: [facility];  Service: Endoscopy;  Laterality: N/A;  . BOTOX INJECTION N/A 12/04/2013   Procedure: BOTOX INJECTION;  Surgeon: Missy Sabins, MD;  Location: [place];  Service: Endoscopy;  Laterality: N/A;  . BOTOX INJECTION N/A 02/26/2014   Procedure: BOTOX INJECTION;  Surgeon: Missy Sabins, MD;  Location: [place];  Service: Endoscopy;  Laterality: N/A;  . BOTOX INJECTION N/A 07/08/2015   Procedure: BOTOX INJECTION;  Surgeon: Teena Irani, MD;  Location: [facility];  Service: Endoscopy;  Laterality: N/A;  . BOTOX INJECTION N/A 04/27/2016   Procedure: BOTOX INJECTION;  Surgeon: Teena Irani, MD;  Location: [facility];  Service: Endoscopy;  Laterality: N/A;  . ESOPHAGOGASTRODUODENOSCOPY  12/21/2011   Procedure: ESOPHAGOGASTRODUODENOSCOPY (EGD);  Surgeon: Missy Sabins, MD;  Location: [facility] ENDOSCOPY;  Service: Endoscopy;  Laterality: N/A;  . ESOPHAGOGASTRODUODENOSCOPY N/A 11/06/2012   Procedure: ESOPHAGOGASTRODUODENOSCOPY (EGD);  Surgeon: Missy Sabins, MD;  Location: [facility];  Service: Endoscopy;  Laterality: N/A;  . ESOPHAGOGASTRODUODENOSCOPY N/A 12/04/2013   Procedure: ESOPHAGOGASTRODUODENOSCOPY (EGD);  Surgeon: Missy Sabins, MD;  Location: [facility] ENDOSCOPY;  Service: Endoscopy;  Laterality: N/A;  . ESOPHAGOGASTRODUODENOSCOPY N/A 02/26/2014   Procedure: ESOPHAGOGASTRODUODENOSCOPY (EGD);  Surgeon: Missy Sabins, MD;  Location: [facility] ENDOSCOPY;  Service: Endoscopy;  Laterality: N/A;  . ESOPHAGOGASTRODUODENOSCOPY (EGD) WITH PROPOFOL N/A 07/08/2015   Procedure: ESOPHAGOGASTRODUODENOSCOPY (EGD)  WITH PROPOFOL;  Surgeon: Teena Irani, MD;  Location: [facility];  Service: Endoscopy;  Laterality: N/A;  . ESOPHAGOGASTRODUODENOSCOPY (EGD) WITH PROPOFOL N/A 04/27/2016   Procedure: ESOPHAGOGASTRODUODENOSCOPY (EGD) WITH PROPOFOL;  Surgeon: Teena Irani, MD;  Location: [facility];  Service: Endoscopy;  Laterality: N/A;  . FLEXIBLE SIGMOIDOSCOPY  05/01/2012    Procedure: FLEXIBLE SIGMOIDOSCOPY;  Surgeon: Jeryl Columbia, MD;  Location: [facility];  Service: Endoscopy;  Laterality: N/A;  unprepped  . LAPAROSCOPIC CHOLECYSTECTOMY  01/2002  . NASAL SEPTOPLASTY W/ TURBINOPLASTY  1998  . SHOULDER ARTHROSCOPY WITH ROTATOR CUFF REPAIR AND OPEN BICEPS TENODESIS Bilateral 2010-2013   left-right    Prior to Admission medications   Medication Sig Start Date End Date Taking? Authorizing Provider  allopurinol (ZYLOPRIM) 300 MG tablet Take 0.5 tablets (150 mg total) by mouth daily. 07/31/16  Yes Shawnee Knapp, MD  ALPRAZolam Duanne Moron) 1 MG tablet Take 1 tablet (1 mg total) by mouth 3 (three) times daily as needed for anxiety. 07/31/16  Yes Shawnee Knapp, MD  augmented betamethasone dipropionate (DIPROLENE-AF) 0.05 % cream Apply topically 2 (two) times daily. Patient taking differently: Apply 1 application topically 2 (two) times daily as needed (palm).  08/02/16  Yes Shawnee Knapp, MD  baclofen (LIORESAL) 10 MG tablet TAKE 1 TABLET (10 MG TOTAL) BY MOUTH 3 (THREE TIMES DAILY) Patient taking differently: Take 10 mg by mouth 3 (three) times daily as needed for muscle spasms.  01/12/16  Yes Darlyne Russian, MD  benazepril (LOTENSIN) 40 MG tablet Take 1 tablet (40 mg total) by mouth daily. 02/18/16  Yes Darlyne Russian, MD  Biotin 5000 MCG CAPS Take 10,000 mcg by mouth daily.    Yes Historical Provider, MD  calcium carbonate (OSCAL) 1500 (600 Ca) MG TABS tablet Take 600 mg of elemental calcium by mouth daily with breakfast.   Yes Historical Provider, MD  cetirizine (ZYRTEC) 10 MG tablet Take 10 mg by mouth daily. Alternates between Allegra and Zyrtec as needed for allergies   Yes Historical Provider, MD  dexlansoprazole (DEXILANT) 60 MG capsule Take 60 mg by mouth 2 (two) times daily.   Yes Historical Provider, MD  fexofenadine (ALLEGRA) 180 MG tablet Take 180 mg by mouth daily as needed. Switches between Allegra and Zyrtec as needed for allergies   Yes Historical Provider, MD  fluticasone  (FLONASE) 50 MCG/ACT nasal spray Place 2 sprays into both nostrils daily. 07/31/16  Yes Shawnee Knapp, MD  HYDROcodone-acetaminophen (NORCO) 10-325 MG per tablet Take 1-2 tablets by mouth every 6 (six) hours as needed for moderate pain (pain.).    Yes Historical Provider, MD  metoprolol succinate (TOPROL-XL) 100 MG 24 hr tablet Take 1 tablet (100 mg total) by mouth daily. Take with or immediately following a meal. 07/31/16  Yes Shawnee Knapp, MD  modafinil (PROVIGIL) 200 MG tablet Take 0.5 tablets (100 mg total) by mouth 2 (two) times daily. 12/30/15  Yes Carmen Dohmeier, MD  montelukast (SINGULAIR) 10 MG tablet TAKE 1 TABLET (10 MG TOTAL) BY MOUTH AT BEDTIME 07/31/16  Yes Shawnee Knapp, MD  morphine (MS CONTIN) 30 MG 12 hr tablet Take 30 mg by mouth every 8 (eight) hours. Not from [facility] 09/23/12  Yes Historical Provider, MD  Multiple Vitamins-Minerals (MULTIVITAMIN PO) Take 1 tablet by mouth daily.   Yes Historical Provider, MD  Olopatadine HCl (PAZEO) 0.7 % SOLN Apply 1 drop to eye daily. 08/02/16  Yes Shawnee Knapp, MD  Pancrelipase, Lip-Prot-Amyl, (CREON) 24000 UNITS  CPEP Take 72,000 Units by mouth 3 (three) times daily as needed (for pancreatitis).    Yes Historical Provider, MD  PROAIR HFA 108 (90 Base) MCG/ACT inhaler INHALE 2 PUFFS INTO THE LUNGS AS NEEDED FOR WHEEZING OR SHORTNESS OF BREATH. 05/29/16  Yes Shawnee Knapp, MD  promethazine (PHENERGAN) 25 MG tablet Take 1 tablet (25 mg total) by mouth every 6 (six) hours as needed for nausea or vomiting. 07/31/16  Yes Shawnee Knapp, MD  spironolactone (ALDACTONE) 100 MG tablet Take 1 tablet (100 mg total) by mouth daily. 07/31/16  Yes Shawnee Knapp, MD  sucralfate (CARAFATE) 1 GM/10ML suspension Take 10 mLs (1 g total) by mouth 4 (four) times daily as needed (stomach pain). 07/31/16  Yes Shawnee Knapp, MD  predniSONE (DELTASONE) 1 MG tablet Take 4 mg by mouth daily with breakfast.    Historical Provider, MD    Current Facility-Administered Medications  Medication Dose Route  Frequency Provider Last Rate Last Dose  . 0.9 %  sodium chloride infusion   Intravenous Continuous Norval Morton, MD      . acetaminophen (TYLENOL) tablet 650 mg  650 mg Oral Q6H PRN Norval Morton, MD       Or  . acetaminophen (TYLENOL) suppository 650 mg  650 mg Rectal Q6H PRN Norval Morton, MD      . ciprofloxacin (CIPRO) IVPB 400 mg  400 mg Intravenous Q12H Norval Morton, MD   400 mg at 09/24/16 1123  . fluticasone (FLONASE) 50 MCG/ACT nasal spray 2 spray  2 spray Each Nare Daily Rondell A Smith, MD      . iopamidol (ISOVUE-300) 61 % injection           . LORazepam (ATIVAN) injection 0.5-1 mg  0.5-1 mg Intravenous Q6H PRN Norval Morton, MD      . metroNIDAZOLE (FLAGYL) IVPB 500 mg  500 mg Intravenous Q8H Rondell Charmayne Sheer, MD   500 mg at 09/24/16 0901  . morphine 4 MG/ML injection 4 mg  4 mg Intravenous Q2H PRN Benito Mccreedy, MD      . olopatadine (PATANOL) 0.1 % ophthalmic solution 1 drop  1 drop Both Eyes Daily Rondell A Tamala Julian, MD      . ondansetron (ZOFRAN) tablet 4 mg  4 mg Oral Q6H PRN Norval Morton, MD       Or  . ondansetron (ZOFRAN) injection 4 mg  4 mg Intravenous Q6H PRN Rondell A Tamala Julian, MD      . pantoprazole (PROTONIX) injection 40 mg  40 mg Intravenous Q12H Norval Morton, MD   40 mg at 09/24/16 0851    Allergies as of 09/23/2016 - Review Complete 09/23/2016  Allergen Reaction Noted  . Dilaudid [hydromorphone hcl] Other (See Comments) 08/01/2011  . Pregabalin Other (See Comments) 08/01/2011  . Metoclopramide hcl Other (See Comments) 08/01/2011  . Neurontin [gabapentin] Other (See Comments) 08/01/2011  . Other Other (See Comments) 08/01/2011    Family History  Problem Relation Age of Onset  . Heart disease Mother   . COPD Mother   . Arthritis Mother   . Cancer Mother   . Hypertension Mother   . Arthritis Paternal Grandmother   . Heart disease Paternal Grandmother   . Heart disease Paternal Grandfather     Social History   Social History  .  Marital status: Single    Spouse name: N/A  . Number of children: 0  . Years of education: [employer]  Occupational History  . disabled    Social History Main Topics  . Smoking status: Never Smoker  . Smokeless tobacco: Never Used  . Alcohol use No  . Drug use: No  . Sexual activity: Not Currently   Other Topics Concern  . Not on file   Social History Narrative   Caffeine 2 cups iced tea daily avg.    Review of Systems: Positive = bold Gen: Denies any fever, chills, rigors, night sweats, anorexia, fatigue, weakness, malaise, involuntary weight loss, and sleep disorder CV: Denies chest pain, angina, palpitations, syncope, orthopnea, PND, peripheral edema, and claudication. Resp: Denies dyspnea, cough, sputum, wheezing, coughing up blood. GI: Described in detail in HPI.    GU : Denies urinary burning, blood in urine, urinary frequency, urinary hesitancy, nocturnal urination, and urinary incontinence. MS: Denies joint pain or swelling.  Denies muscle weakness, cramps, atrophy.  Derm: Denies rash, itching, oral ulcerations, hives, unhealing ulcers.  Psych: Denies depression, anxiety, memory loss, suicidal ideation, hallucinations,  and confusion. Heme: Denies bruising, bleeding, and enlarged lymph nodes. Neuro:  Denies any headaches, dizziness, paresthesias. Endo:  Denies any problems with DM, thyroid, adrenal function.  Physical Exam: Vital signs in last 24 hours: Temp:  [98.2 F (36.8 C)-98.5 F (36.9 C)] 98.2 F (36.8 C) (04/15 0427) Pulse Rate:  [62-105] 65 (04/15 0427) Resp:  [10-18] 16 (04/15 0427) BP: (99-142)/(55-96) 124/72 (04/15 0427) SpO2:  [98 %-100 %] 100 % (04/15 0427) Weight:  [131.1 kg (289 lb)] 131.1 kg (289 lb) (04/15 0427) Last BM Date: 09/24/16 General:   Alert, morbidly obese, Well-developed, well-nourished, pleasant and cooperative in NAD Head:  Normocephalic and atraumatic. Eyes:  Sclera clear, no icterus.   Conjunctiva pink. Ears:  Normal auditory  acuity. Nose:  No deformity, discharge,  or lesions. Mouth:  No deformity or lesions.  Oropharynx pink & moist. Neck:  Supple; no masses or thyromegaly. Lungs:  Diminished air sounds diffusely likely due to patient's body habitus, otherwise clear throughout to auscultation.   No wheezes, crackles, or rhonchi. No acute distress. Heart:  Diminished heart sounds diffusely, likely due to patient's body habitus, otherwise regular rate and rhythm; no murmurs, clicks, rubs,  or gallops. Abdomen:  Soft, protuberant, mild non-descript diffuse lower abdominal tenderness without peritonitis; No masses, hepatosplenomegaly or hernias noted. Normal bowel sounds, without guarding, and without rebound.     Msk:  Symmetrical without gross deformities. Normal posture. Pulses:  Normal pulses noted. Extremities:  Without clubbing or edema. Neurologic:  Alert and  oriented x4; diffusely weak, otherwise grossly normal neurologically. Skin:  Intact without significant lesions or rashes. Psych:  Alert and cooperative. Depressed mood, flat affect  Lab Results:  Recent Labs  09/23/16 2305 09/24/16 0629  WBC 16.7* 11.3*  HGB 14.0 11.4*  HCT 41.7 34.9*  PLT 332 235   BMET  Recent Labs  09/23/16 2305 09/24/16 0629  NA 137 139  K 3.8 3.7  CL 105 108  CO2 18* 22  GLUCOSE 142* 96  BUN 23* 19  CREATININE 1.40* 1.10*  CALCIUM 10.0 8.6*   LFT  Recent Labs  09/24/16 0629  PROT 6.9  ALBUMIN 3.7  AST 14*  ALT 13*  ALKPHOS 35*  BILITOT 0.8   PT/INR No results for input(s): LABPROT, INR in the last 72 hours.  Studies/Results: Ct Abdomen Pelvis W Contrast  Result Date: 09/24/2016 CLINICAL DATA:  Acute onset of abdominal pain and cramping. Dizziness and near syncope. Bright red blood per rectum. Initial encounter. EXAM:  CT ABDOMEN AND PELVIS WITH CONTRAST TECHNIQUE: Multidetector CT imaging of the abdomen and pelvis was performed using the standard protocol following bolus administration of  intravenous contrast. CONTRAST:  18mL ISOVUE-300 IOPAMIDOL (ISOVUE-300) INJECTION 61% COMPARISON:  CT of the abdomen pelvis from 06/16/2015, and MRCP performed 07/27/2015 FINDINGS: Lower chest: The visualized lung bases are grossly clear. The visualized portions of the mediastinum are unremarkable. Hepatobiliary: The liver is unremarkable in appearance. The patient is status post cholecystectomy, with clips noted at the gallbladder fossa. The common bile duct remains normal in caliber. Pancreas: The pancreas is within normal limits. Spleen: The spleen is unremarkable in appearance. Adrenals/Urinary Tract: The adrenal glands are unremarkable in appearance. The kidneys are within normal limits. There is no evidence of hydronephrosis. No renal or ureteral stones are identified. No perinephric stranding is seen. Stomach/Bowel: The stomach is unremarkable in appearance. The small bowel is within normal limits. The appendix is normal in caliber, without evidence of appendicitis. Mild wall thickening is noted along the descending colon, with minimal associated soft tissue inflammation, raising question for a mild infectious or inflammatory process. Vascular/Lymphatic: The abdominal aorta is unremarkable in appearance. The inferior vena cava is grossly unremarkable. No retroperitoneal lymphadenopathy is seen. No pelvic sidewall lymphadenopathy is identified. Reproductive: The bladder is mildly distended and within normal limits. The uterus is grossly unremarkable in appearance. The ovaries are relatively symmetric. No suspicious adnexal masses are seen. Other: No additional soft tissue abnormalities are seen. Musculoskeletal: No acute osseous abnormalities are identified. The visualized musculature is unremarkable in appearance. IMPRESSION: Mild wall thickening along the descending colon, with minimal associated soft tissue inflammation, raising question for a mild infectious or inflammatory process. Electronically Signed    By: Garald Balding M.D.   On: 09/24/2016 01:32   Impression:  1.  Abdominal pain, lower abdominal. 2.  Blood in stool, improving. 3.  Abnormal CT scan abdomen/pelvis, thickening in descending colon.  Overall constellation of findings most consistent with recurrent ischemic colitis.  Plan:  1.  Sips clear liquids for diet today. 2.  Follow CBCs. 3.  Judicious analgesics. 4.  On empiric antibiotics. 5.  Given near-identical presentation in 2013 (with sigmoidoscopy at that time suggestive of ischemic colitis) and patient's having had colonoscopy about 6 months ago (which was unrevealing). 6.  [facility] will follow.   LOS: 0 days   Ura Hausen M  09/24/2016, 12:18 PM  Pager [phone] If no answer or after 5 PM call [phone]

## 2016-09-25 DIAGNOSIS — E860 Dehydration: Secondary | ICD-10-CM

## 2016-09-25 DIAGNOSIS — K5010 Crohn's disease of large intestine without complications: Secondary | ICD-10-CM

## 2016-09-25 DIAGNOSIS — K922 Gastrointestinal hemorrhage, unspecified: Secondary | ICD-10-CM

## 2016-09-25 DIAGNOSIS — R7302 Impaired glucose tolerance (oral): Secondary | ICD-10-CM

## 2016-09-25 LAB — BASIC METABOLIC PANEL
Anion gap: 7 (ref 5–15)
BUN: 9 mg/dL (ref 6–20)
CHLORIDE: 107 mmol/L (ref 101–111)
CO2: 24 mmol/L (ref 22–32)
Calcium: 8.7 mg/dL — ABNORMAL LOW (ref 8.9–10.3)
Creatinine, Ser: 1 mg/dL (ref 0.44–1.00)
Glucose, Bld: 123 mg/dL — ABNORMAL HIGH (ref 65–99)
POTASSIUM: 3.9 mmol/L (ref 3.5–5.1)
SODIUM: 138 mmol/L (ref 135–145)

## 2016-09-25 LAB — CBC
HEMATOCRIT: 35 % — AB (ref 36.0–46.0)
Hemoglobin: 11.4 g/dL — ABNORMAL LOW (ref 12.0–15.0)
MCH: 29.3 pg (ref 26.0–34.0)
MCHC: 32.6 g/dL (ref 30.0–36.0)
MCV: 90 fL (ref 78.0–100.0)
PLATELETS: 245 10*3/uL (ref 150–400)
RBC: 3.89 MIL/uL (ref 3.87–5.11)
RDW: 13.8 % (ref 11.5–15.5)
WBC: 7.7 10*3/uL (ref 4.0–10.5)

## 2016-09-25 MED ORDER — PROMETHAZINE HCL 25 MG/ML IJ SOLN
12.5000 mg | Freq: Four times a day (QID) | INTRAMUSCULAR | Status: DC | PRN
  Administered 2016-09-25 (×2): 12.5 mg via INTRAVENOUS
  Filled 2016-09-25 (×2): qty 1

## 2016-09-25 NOTE — Progress Notes (Signed)
Pt has home CPAP and prefers self placement.  RT to monitor and assess as needed.  

## 2016-09-25 NOTE — Progress Notes (Addendum)
PROGRESS NOTE  Valerie [name]:[id number] DOB: 1960-06-28 DOA: 09/23/2016 PCP: Delman Cheadle, MD  Brief History:  56 year old female with a history of ischemic colitis, hypertension, fibromyalgia, and chronic pain and presented with 1-2 day history of hematochezia and Abdominal pain with bowel urgency. The patient developed numerous episodes of hematochezia on 09/23/2012 with associated lightheadedness. She states that this was a similar presentation to that when she was admitted in 2013 with ischemic colitis. The patient had colonoscopy 6 months prior to this admission which showed sigmoid diverticulosis, but was otherwise unrevealing. The patient denied fevers, chills, chest pain, shortness breath, vomiting, dysuria, hematuria. She has nausea without emesis. Upon presentation, the patient was noted to have hemoglobin 14.0 with a PVC on 0.3. Hepatic enzymes and BMP was essentially unremarkable. GI was consulted to assist with management.  Assessment/Plan: Abdominal pain with abnormal CT abdomen--ischemic colitis -Most likely ischemic colitis -09/24/2016 CT abdomen and pelvis--mild wall thickening of the descending colon with minimal associated soft tissue inflammatory changes -Appreciate GI consult -Continue empiric Cipro and Flagyl -Continue IV fluids -Possible sigmoidoscopy if no improvement -Continue clear liquids  Dehydration -Baseline creatinine 1.0-1.2 -Presenting creatinine 1.40 -Continue IV fluids  Fibromyalgia/chronic pain -Restart home dose MS Contin -Continue morphine IV when necessary severe pain  Hypertension -Holding benazepril, metoprolol succinate, and spironolactone secondary to soft blood pressure  Anxiety -Restart home dose alprazolam  GERD -Continue PPI  -Restart Carafate once able to tolerate by mouth  Impaired glucose tolerance -HbA1C-6.2 -lifestyle modification    Disposition Plan:   Home when cleared by GI, likely 2-3 days Family  Communication:  No Family at bedside  Consultants:  [facility]  Code Status:  FULL   DVT Prophylaxis:  SCDs   Procedures: As Listed in Progress Note Above  Antibiotics: None    Subjective: Patient states that the volume hematochezia has been improving but continues to have abdominal pain and nausea. Denies any fevers, chills, chest pain, shortness breath, coughing, hemoptysis. No dysuria or hematuria. No rashes.  Objective: Vitals:   09/24/16 1406 09/24/16 2024 09/24/16 2305 09/25/16 0536  BP: 121/70 (!) 97/58 (!) 106/51 95/60  Pulse: 67 72  72  Resp: 18 18  18   Temp: 98.4 F (36.9 C) 98.5 F (36.9 C)  98.7 F (37.1 C)  TempSrc: Oral Oral  Oral  SpO2: 100% 99%  97%  Weight:      Height:        Intake/Output Summary (Last 24 hours) at 09/25/16 0953 Last data filed at 09/25/16 0800  Gross per 24 hour  Intake             3350 ml  Output                0 ml  Net             3350 ml   Weight change:  Exam:   General:  Pt is alert, follows commands appropriately, not in acute distress  HEENT: No icterus, No thrush, No neck mass, /AT  Cardiovascular: RRR, S1/S2, no rubs, no gallops  Respiratory: CTA bilaterally, no wheezing, no crackles, no rhonchi  Abdomen: Soft/+BS, RUQ and RLQ tender, non distended, no guarding  Extremities: 1 + LE edema, No lymphangitis, No petechiae, No rashes, no synovitis   Data Reviewed: I have personally reviewed following labs and imaging studies Basic Metabolic Panel:  Recent Labs Lab 09/18/16 1607 09/23/16 2305 09/24/16 0629 09/25/16 0515  NA  143 137 139 138  K 4.8 3.8 3.7 3.9  CL 103 105 108 107  CO2 19 18* 22 24  GLUCOSE 109* 142* 96 123*  BUN 19 23* 19 9  CREATININE 1.06* 1.40* 1.10* 1.00  CALCIUM 9.9 10.0 8.6* 8.7*   Liver Function Tests:  Recent Labs Lab 09/18/16 1607 09/23/16 2305 09/24/16 0629  AST 12 21 14*  ALT 12 17 13*  ALKPHOS 57 45 35*  BILITOT <0.2 0.8 0.8  PROT 7.6 8.5* 6.9  ALBUMIN 4.5 4.7  3.7    Recent Labs Lab 09/18/16 1607 09/24/16 0629  LIPASE 68 33   No results for input(s): AMMONIA in the last 168 hours. Coagulation Profile: No results for input(s): INR, PROTIME in the last 168 hours. CBC:  Recent Labs Lab 09/23/16 2305 09/24/16 0629 09/24/16 1206 09/25/16 0515  WBC 16.7* 11.3*  --  7.7  HGB 14.0 11.4* 11.5* 11.4*  HCT 41.7 34.9* 35.3* 35.0*  MCV 90.7 91.1  --  90.0  PLT 332 235  --  245   Cardiac Enzymes: No results for input(s): CKTOTAL, CKMB, CKMBINDEX, TROPONINI in the last 168 hours. BNP: Invalid input(s): POCBNP CBG: No results for input(s): GLUCAP in the last 168 hours. HbA1C: No results for input(s): HGBA1C in the last 72 hours. Urine analysis:    Component Value Date/Time   COLORURINE YELLOW 12/28/2015 1100   APPEARANCEUR CLOUDY (A) 12/28/2015 1100   LABSPEC 1.016 12/28/2015 1100   PHURINE 5.5 12/28/2015 1100   GLUCOSEU NEGATIVE 12/28/2015 1100   HGBUR MODERATE (A) 12/28/2015 1100   BILIRUBINUR negative 09/18/2016 1517   BILIRUBINUR negative 12/27/2014 0942   KETONESUR negative 09/18/2016 1517   KETONESUR 40 (A) 12/28/2015 1100   PROTEINUR negative 09/18/2016 1517   PROTEINUR NEGATIVE 12/28/2015 1100   UROBILINOGEN 0.2 09/18/2016 1517   UROBILINOGEN 0.2 02/03/2014 2022   NITRITE Negative 09/18/2016 1517   NITRITE NEGATIVE 12/28/2015 1100   LEUKOCYTESUR Negative 09/18/2016 1517   Sepsis Labs: @LABRCNTIP (procalcitonin:4,lacticidven:4) ) Recent Results (from the past 240 hour(s))  Urine culture     Status: None   Collection Time: 09/18/16  4:07 PM  Result Value Ref Range Status   Urine Culture, Routine Final report  Final   Urine Culture result 1 No growth  Final     Scheduled Meds: . ciprofloxacin  400 mg Intravenous Q12H  . fluticasone  2 spray Each Nare Daily  . metronidazole  500 mg Intravenous Q8H  . olopatadine  1 drop Both Eyes Daily  . pantoprazole (PROTONIX) IV  40 mg Intravenous Q12H   Continuous  Infusions: . sodium chloride 100 mL/hr at 09/24/16 0330    Procedures/Studies: Ct Abdomen Pelvis W Contrast  Result Date: 09/24/2016 CLINICAL DATA:  Acute onset of abdominal pain and cramping. Dizziness and near syncope. Bright red blood per rectum. Initial encounter. EXAM: CT ABDOMEN AND PELVIS WITH CONTRAST TECHNIQUE: Multidetector CT imaging of the abdomen and pelvis was performed using the standard protocol following bolus administration of intravenous contrast. CONTRAST:  53mL ISOVUE-300 IOPAMIDOL (ISOVUE-300) INJECTION 61% COMPARISON:  CT of the abdomen pelvis from 06/16/2015, and MRCP performed 07/27/2015 FINDINGS: Lower chest: The visualized lung bases are grossly clear. The visualized portions of the mediastinum are unremarkable. Hepatobiliary: The liver is unremarkable in appearance. The patient is status post cholecystectomy, with clips noted at the gallbladder fossa. The common bile duct remains normal in caliber. Pancreas: The pancreas is within normal limits. Spleen: The spleen is unremarkable in appearance. Adrenals/Urinary Tract: The adrenal  glands are unremarkable in appearance. The kidneys are within normal limits. There is no evidence of hydronephrosis. No renal or ureteral stones are identified. No perinephric stranding is seen. Stomach/Bowel: The stomach is unremarkable in appearance. The small bowel is within normal limits. The appendix is normal in caliber, without evidence of appendicitis. Mild wall thickening is noted along the descending colon, with minimal associated soft tissue inflammation, raising question for a mild infectious or inflammatory process. Vascular/Lymphatic: The abdominal aorta is unremarkable in appearance. The inferior vena cava is grossly unremarkable. No retroperitoneal lymphadenopathy is seen. No pelvic sidewall lymphadenopathy is identified. Reproductive: The bladder is mildly distended and within normal limits. The uterus is grossly unremarkable in  appearance. The ovaries are relatively symmetric. No suspicious adnexal masses are seen. Other: No additional soft tissue abnormalities are seen. Musculoskeletal: No acute osseous abnormalities are identified. The visualized musculature is unremarkable in appearance. IMPRESSION: Mild wall thickening along the descending colon, with minimal associated soft tissue inflammation, raising question for a mild infectious or inflammatory process. Electronically Signed   By: Garald Balding M.D.   On: 09/24/2016 01:32    Emillia Weatherly, DO  [facility] Pager [phone]  If 7PM-7AM, please contact night-coverage [email or web address] Password [email or web address] 09/25/2016, 9:53 AM   LOS: 1 day

## 2016-09-25 NOTE — Progress Notes (Signed)
Subjective: Still having lower abdominal pain. Blood in stool now coming back.  Objective: Vital signs in last 24 hours: Temp:  [98.5 F (36.9 C)-98.7 F (37.1 C)] 98.6 F (37 C) (04/16 1416) Pulse Rate:  [72-84] 84 (04/16 1416) Resp:  [18] 18 (04/16 1416) BP: (95-133)/(51-91) 133/91 (04/16 1416) SpO2:  [97 %-99 %] 99 % (04/16 1416) Weight change:  Last BM Date: 09/25/16  PE: GEN:  Obese, NAD ABD:  Protuberant, mild lower abdominal tenderness without peritonitis  Lab Results: CBC    Component Value Date/Time   WBC 7.7 09/25/2016 0515   RBC 3.89 09/25/2016 0515   HGB 11.4 (L) 09/25/2016 0515   HCT 35.0 (L) 09/25/2016 0515   HCT 38.9 07/31/2016 1412   PLT 245 09/25/2016 0515   PLT 300 07/31/2016 1412   MCV 90.0 09/25/2016 0515   MCV 92 07/31/2016 1412   MCH 29.3 09/25/2016 0515   MCHC 32.6 09/25/2016 0515   RDW 13.8 09/25/2016 0515   RDW 14.3 07/31/2016 1412   LYMPHSABS 2,720 01/10/2016 1504   MONOABS 680 01/10/2016 1504   EOSABS 170 01/10/2016 1504   BASOSABS 85 01/10/2016 1504   CMP     Component Value Date/Time   NA 138 09/25/2016 0515   NA 143 09/18/2016 1607   K 3.9 09/25/2016 0515   CL 107 09/25/2016 0515   CO2 24 09/25/2016 0515   GLUCOSE 123 (H) 09/25/2016 0515   BUN 9 09/25/2016 0515   BUN 19 09/18/2016 1607   CREATININE 1.00 09/25/2016 0515   CREATININE 1.07 (H) 04/14/2016 1259   CALCIUM 8.7 (L) 09/25/2016 0515   PROT 6.9 09/24/2016 0629   PROT 7.6 09/18/2016 1607   ALBUMIN 3.7 09/24/2016 0629   ALBUMIN 4.5 09/18/2016 1607   AST 14 (L) 09/24/2016 0629   ALT 13 (L) 09/24/2016 0629   ALKPHOS 35 (L) 09/24/2016 0629   BILITOT 0.8 09/24/2016 0629   BILITOT <0.2 09/18/2016 1607   GFRNONAA >60 09/25/2016 0515   GFRNONAA 53 (L) 01/21/2016 1018   GFRAA >60 09/25/2016 0515   GFRAA 61 01/21/2016 1018   Assessment:  1.  Abdominal pain, lower abdominal. 2.  Blood in stool, improving. 3.  Abnormal CT scan abdomen/pelvis, thickening in descending  colon.  Overall constellation of findings most consistent with recurrent ischemic colitis.  Plan:  1.  Follow supportively. 2.  If abdominal pain and bleeding persists over the next day or two, would consider repeat flex sig, and if ischemic changes again seen could consider CT angiogram, but ischemic colitis is almost never associated with large vascular insults (atherosclerosis, embolism, etc) but rather is typically small vessel process not discerned on CT studies. 3.  Will follow.   Landry Dyke 09/25/2016, 3:18 PM   Pager [phone] If no answer or after 5 PM call [phone]

## 2016-09-26 LAB — BASIC METABOLIC PANEL
Anion gap: 9 (ref 5–15)
BUN: 7 mg/dL (ref 6–20)
CALCIUM: 8.8 mg/dL — AB (ref 8.9–10.3)
CO2: 22 mmol/L (ref 22–32)
CREATININE: 1.09 mg/dL — AB (ref 0.44–1.00)
Chloride: 110 mmol/L (ref 101–111)
GFR, EST NON AFRICAN AMERICAN: 56 mL/min — AB (ref >60)
Glucose, Bld: 117 mg/dL — ABNORMAL HIGH (ref 65–99)
Potassium: 3.7 mmol/L (ref 3.5–5.1)
SODIUM: 141 mmol/L (ref 135–145)

## 2016-09-26 LAB — MAGNESIUM: MAGNESIUM: 1.3 mg/dL — AB (ref 1.7–2.4)

## 2016-09-26 MED ORDER — METOPROLOL SUCCINATE ER 50 MG PO TB24
100.0000 mg | ORAL_TABLET | Freq: Once | ORAL | Status: AC
  Administered 2016-09-26: 100 mg via ORAL
  Filled 2016-09-26: qty 2

## 2016-09-26 MED ORDER — ALPRAZOLAM 1 MG PO TABS
1.0000 mg | ORAL_TABLET | Freq: Three times a day (TID) | ORAL | Status: DC | PRN
  Administered 2016-09-26 – 2016-09-27 (×3): 1 mg via ORAL
  Filled 2016-09-26 (×3): qty 1

## 2016-09-26 MED ORDER — MORPHINE SULFATE ER 30 MG PO TBCR
30.0000 mg | EXTENDED_RELEASE_TABLET | Freq: Three times a day (TID) | ORAL | Status: DC
  Administered 2016-09-26 – 2016-09-29 (×10): 30 mg via ORAL
  Filled 2016-09-26 (×10): qty 1

## 2016-09-26 MED ORDER — SODIUM CHLORIDE 0.9 % IV SOLN
INTRAVENOUS | Status: DC
  Administered 2016-09-26 – 2016-09-28 (×5): via INTRAVENOUS
  Filled 2016-09-26 (×8): qty 1000

## 2016-09-26 NOTE — Progress Notes (Signed)
[facility]astroenterology Progress Note  Valerie [name] 56 y.o. April 08, 1961   Subjective: No rectal bleeding or BM since 430AM and was described as maroon at that time. Feels ok. Tolerating clear liquid diet.  Objective: Vital signs in last 24 hours: Vitals:   09/26/16 0534 09/26/16 1500  BP: 138/83 (!) 144/81  Pulse: 76 72  Resp: 18 18  Temp: 98.5 F (36.9 C) 98.9 F (37.2 C)    Physical Exam: Gen: alert, no acute distress, well-nourished CV: RRR Chest: CTA B Abd: right-sided tenderness with guarding, soft, nondistended, +BS Ext: no edema  Lab Results:  Recent Labs  09/25/16 0515 09/26/16 0632  NA 138 141  K 3.9 3.7  CL 107 110  CO2 24 22  GLUCOSE 123* 117*  BUN 9 7  CREATININE 1.00 1.09*  CALCIUM 8.7* 8.8*  MG  --  1.3*    Recent Labs  09/23/16 2305 09/24/16 0629  AST 21 14*  ALT 17 13*  ALKPHOS 45 35*  BILITOT 0.8 0.8  PROT 8.5* 6.9  ALBUMIN 4.7 3.7    Recent Labs  09/24/16 0629 09/24/16 1206 09/25/16 0515  WBC 11.3*  --  7.7  HGB 11.4* 11.5* 11.4*  HCT 34.9* 35.3* 35.0*  MCV 91.1  --  90.0  PLT 235  --  245   No results for input(s): LABPROT, INR in the last 72 hours.    Assessment/Plan: Likely ischemic colitis - hopefully her bleeding is resolving. Hgb stable yesterday. Hold off on flex sig. Continue Abx. Change to full liquids and if ok tomorrow without further bleeding then can advance further.   [place] C. 09/26/2016, 3:42 PM   Pager [phone]  AFTER 5 pm or on weekends call [phone] ID: Valerie [name], female   DOB: Oct 14, 1960, 56 y.o.   MRN: [id number]

## 2016-09-26 NOTE — Progress Notes (Signed)
PROGRESS NOTE  Valerie [name] SWH:[id number] DOB: September 30, 1960 DOA: 09/23/2016 PCP: Delman Cheadle, MD  Brief History:  56 year old female with a history of ischemic colitis, hypertension, fibromyalgia, and chronic pain and presented with 1-2 day history of hematochezia and Abdominal pain with bowel urgency. The patient developed numerous episodes of hematochezia on 09/23/2012 with associated lightheadedness. She states that this was a similar presentation to that when she was admitted in 2013 with ischemic colitis. The patient had colonoscopy 6 months prior to this admission which showed sigmoid diverticulosis, but was otherwise unrevealing. The patient denied fevers, chills, chest pain, shortness breath, vomiting, dysuria, hematuria. She has nausea without emesis. Upon presentation, the patient was noted to have hemoglobin 14.0 with a PVC on 0.3. Hepatic enzymes and BMP was essentially unremarkable. GI was consulted to assist with management.  Assessment/Plan: Abdominal pain with abnormal CT abdomen--Ischemic colitis -Most likely ischemic colitis -?related to myeloperoxidase ab positive on 03/03/16 -09/24/2016 CT abdomen and pelvis--mild wall thickening of the descending colon with minimal associated soft tissue inflammatory changes -Appreciate GI consult -Continue empiric Cipro and Flagyl -Continue IV fluids -Possible sigmoidoscopy if no improvement -Continue clear liquids -amount of blood is decreasing -continue IV morphine prn abdominal pain -am CBC  Dehydration -Baseline creatinine 1.0-1.2 -Presenting creatinine 1.40 -Continue IV fluids  Myeloperoxidase antibody positive -?vasculitis causing ischemia -positive myeloperoxidase ab positive on 03/03/16 and 03/21/16  Fibromyalgia/chronic pain -Restart home dose MS Contin -Continue morphine IV when necessary severe pain  Hypertension -Holding benazepril, metoprolol succinate, and spironolactone secondary to soft blood  pressure  Anxiety -Restart home dose alprazolam  GERD -Continue PPI  -Restart Carafate once able to tolerate by mouth  Impaired glucose tolerance -HbA1C-6.2 -lifestyle modification   Disposition Plan:   Home when cleared by GI, likely 2-3 days Family Communication:  No Family at bedside  Consultants:  [facility]  Code Status:  FULL   DVT Prophylaxis:  SCDs Previous medical records reviewed and summarized  Procedures: As Listed in Progress Note Above  Antibiotics: cipro 4/15>>> Flagyl 4/15>>>    Subjective: Patient says that amount of hematochezia is decreasing.  Abdominal pain is still intermittent and colicky. Denies any vomiting but complains of nausea. Denies any fevers, chills, chest pain, shortness breath, dysuria, hematuria.  Objective: Vitals:   09/25/16 0536 09/25/16 1416 09/25/16 2116 09/26/16 0534  BP: 95/60 (!) 133/91 121/70 138/83  Pulse: 72 84 97 76  Resp: 18 18 18 18   Temp: 98.7 F (37.1 C) 98.6 F (37 C) 98.8 F (37.1 C) 98.5 F (36.9 C)  TempSrc: Oral Oral Oral Oral  SpO2: 97% 99% 99% 98%  Weight:      Height:       No intake or output data in the 24 hours ending 09/26/16 1416 Weight change:  Exam:   General:  Pt is alert, follows commands appropriately, not in acute distress  HEENT: No icterus, No thrush, No neck mass, [place]/AT  Cardiovascular: RRR, S1/S2, no rubs, no gallops  Respiratory: CTA bilaterally, no wheezing, no crackles, no rhonchi  Abdomen: Soft/+BS, RLQ, periumbiliical tender, non distended, no guarding  Extremities: No edema, No lymphangitis, No petechiae, No rashes, no synovitis   Data Reviewed: I have personally reviewed following labs and imaging studies Basic Metabolic Panel:  Recent Labs Lab 09/23/16 2305 09/24/16 0629 09/25/16 0515 09/26/16 0632  NA 137 139 138 141  K 3.8 3.7 3.9 3.7  CL 105 108 107 110  CO2 18* 22  24 22  GLUCOSE 142* 96 123* 117*  BUN 23* 19 9 7   CREATININE 1.40* 1.10*  1.00 1.09*  CALCIUM 10.0 8.6* 8.7* 8.8*  MG  --   --   --  1.3*   Liver Function Tests:  Recent Labs Lab 09/23/16 2305 09/24/16 0629  AST 21 14*  ALT 17 13*  ALKPHOS 45 35*  BILITOT 0.8 0.8  PROT 8.5* 6.9  ALBUMIN 4.7 3.7    Recent Labs Lab 09/24/16 0629  LIPASE 33   No results for input(s): AMMONIA in the last 168 hours. Coagulation Profile: No results for input(s): INR, PROTIME in the last 168 hours. CBC:  Recent Labs Lab 09/23/16 2305 09/24/16 0629 09/24/16 1206 09/25/16 0515  WBC 16.7* 11.3*  --  7.7  HGB 14.0 11.4* 11.5* 11.4*  HCT 41.7 34.9* 35.3* 35.0*  MCV 90.7 91.1  --  90.0  PLT 332 235  --  245   Cardiac Enzymes: No results for input(s): CKTOTAL, CKMB, CKMBINDEX, TROPONINI in the last 168 hours. BNP: Invalid input(s): POCBNP CBG: No results for input(s): GLUCAP in the last 168 hours. HbA1C: No results for input(s): HGBA1C in the last 72 hours. Urine analysis:    Component Value Date/Time   COLORURINE YELLOW 12/28/2015 1100   APPEARANCEUR CLOUDY (A) 12/28/2015 1100   LABSPEC 1.016 12/28/2015 1100   PHURINE 5.5 12/28/2015 1100   GLUCOSEU NEGATIVE 12/28/2015 1100   HGBUR MODERATE (A) 12/28/2015 1100   BILIRUBINUR negative 09/18/2016 1517   BILIRUBINUR negative 12/27/2014 0942   KETONESUR negative 09/18/2016 1517   KETONESUR 40 (A) 12/28/2015 1100   PROTEINUR negative 09/18/2016 1517   PROTEINUR NEGATIVE 12/28/2015 1100   UROBILINOGEN 0.2 09/18/2016 1517   UROBILINOGEN 0.2 02/03/2014 2022   NITRITE Negative 09/18/2016 1517   NITRITE NEGATIVE 12/28/2015 1100   LEUKOCYTESUR Negative 09/18/2016 1517   Sepsis Labs: @LABRCNTIP (procalcitonin:4,lacticidven:4) ) Recent Results (from the past 240 hour(s))  Urine culture     Status: None   Collection Time: 09/18/16  4:07 PM  Result Value Ref Range Status   Urine Culture, Routine Final report  Final   Urine Culture result 1 No growth  Final     Scheduled Meds: . fluticasone  2 spray Each  Nare Daily  . morphine  30 mg Oral Q8H  . olopatadine  1 drop Both Eyes Daily  . pantoprazole (PROTONIX) IV  40 mg Intravenous Q12H   Continuous Infusions: . sodium chloride 100 mL/hr at 09/25/16 1701  . ciprofloxacin    . metronidazole      Procedures/Studies: Ct Abdomen Pelvis W Contrast  Result Date: 09/24/2016 CLINICAL DATA:  Acute onset of abdominal pain and cramping. Dizziness and near syncope. Bright red blood per rectum. Initial encounter. EXAM: CT ABDOMEN AND PELVIS WITH CONTRAST TECHNIQUE: Multidetector CT imaging of the abdomen and pelvis was performed using the standard protocol following bolus administration of intravenous contrast. CONTRAST:  4mL ISOVUE-300 IOPAMIDOL (ISOVUE-300) INJECTION 61% COMPARISON:  CT of the abdomen pelvis from 06/16/2015, and MRCP performed 07/27/2015 FINDINGS: Lower chest: The visualized lung bases are grossly clear. The visualized portions of the mediastinum are unremarkable. Hepatobiliary: The liver is unremarkable in appearance. The patient is status post cholecystectomy, with clips noted at the gallbladder fossa. The common bile duct remains normal in caliber. Pancreas: The pancreas is within normal limits. Spleen: The spleen is unremarkable in appearance. Adrenals/Urinary Tract: The adrenal glands are unremarkable in appearance. The kidneys are within normal limits. There is no evidence of hydronephrosis.  No renal or ureteral stones are identified. No perinephric stranding is seen. Stomach/Bowel: The stomach is unremarkable in appearance. The small bowel is within normal limits. The appendix is normal in caliber, without evidence of appendicitis. Mild wall thickening is noted along the descending colon, with minimal associated soft tissue inflammation, raising question for a mild infectious or inflammatory process. Vascular/Lymphatic: The abdominal aorta is unremarkable in appearance. The inferior vena cava is grossly unremarkable. No retroperitoneal  lymphadenopathy is seen. No pelvic sidewall lymphadenopathy is identified. Reproductive: The bladder is mildly distended and within normal limits. The uterus is grossly unremarkable in appearance. The ovaries are relatively symmetric. No suspicious adnexal masses are seen. Other: No additional soft tissue abnormalities are seen. Musculoskeletal: No acute osseous abnormalities are identified. The visualized musculature is unremarkable in appearance. IMPRESSION: Mild wall thickening along the descending colon, with minimal associated soft tissue inflammation, raising question for a mild infectious or inflammatory process. Electronically Signed   By: Garald Balding M.D.   On: 09/24/2016 01:32   Mm Screening Breast Tomo Bilateral  Result Date: 09/26/2016 CLINICAL DATA:  Screening. EXAM: 2D DIGITAL SCREENING BILATERAL MAMMOGRAM WITH CAD AND ADJUNCT TOMO COMPARISON:  None. ACR Breast Density Category b: There are scattered areas of fibroglandular density. FINDINGS: There are no findings suspicious for malignancy. Images were processed with CAD. IMPRESSION: No mammographic evidence of malignancy. A result letter of this screening mammogram will be mailed directly to the patient. RECOMMENDATION: Screening mammogram in one year. (Code:SM-B-01Y) BI-RADS CATEGORY  1: Negative. Electronically Signed   By: Nolon Nations M.D.   On: 09/26/2016 13:01    Aikeem Lilley, DO  [facility] Pager [phone]  If 7PM-7AM, please contact night-coverage [email or web address] Password [email or web address] 09/26/2016, 2:16 PM   LOS: 2 days

## 2016-09-26 NOTE — Progress Notes (Signed)
Patient complaining of headache since the beginning of this shift. Patient educated that morphine may cause headaches due to vasodilation. Patient states that morphine does not typically cause headaches for her. Patient states that when she has muscular headaches she takes xanax and ibuprofen, patient currently having bloody stools so ibuprofen is contraindicated. RN offered tylenol for headache and patient refused stating that she wanted morphine. Patient VS taken, BP 151/89. Patient states that her BP has been elevated today and that she is usually on 3 BP medications at home. Patient believes that her headaches may now be the cause of elevated BP. On call NP paged for orders to restart her metoprolol. Awaiting new orders. Will continue to monitor.

## 2016-09-26 NOTE — Care Management Note (Signed)
Case Management Note  Patient Details  Name: CAMARIA GERALD MRN: [id number] Date of Birth: 1961/01/11  Subjective/Objective:     Rectal bleeding               Action/Plan:  Lives alone Date:  September 26, 2016 Chart reviewed for concurrent status and case management needs. Will continue to follow patient progress. Discharge Planning: following for needs Expected discharge date: [id number] Velva Harman, BSN, [place], [place]  Expected Discharge Date:                  Expected Discharge Plan:  Home/Self Care  In-House Referral:     Discharge planning Services     Post Acute Care Choice:    Choice offered to:     DME Arranged:    DME Agency:     HH Arranged:    [place] Agency:     Status of Service:  In process, will continue to follow  If discussed at Long Length of Stay Meetings, dates discussed:    Additional Comments:  Leeroy Cha, RN 09/26/2016, 10:47 AM

## 2016-09-27 DIAGNOSIS — K529 Noninfective gastroenteritis and colitis, unspecified: Secondary | ICD-10-CM

## 2016-09-27 LAB — BASIC METABOLIC PANEL
ANION GAP: 7 (ref 5–15)
BUN: 6 mg/dL (ref 6–20)
CALCIUM: 8.5 mg/dL — AB (ref 8.9–10.3)
CO2: 25 mmol/L (ref 22–32)
Chloride: 105 mmol/L (ref 101–111)
Creatinine, Ser: 1.08 mg/dL — ABNORMAL HIGH (ref 0.44–1.00)
GFR, EST NON AFRICAN AMERICAN: 57 mL/min — AB (ref >60)
Glucose, Bld: 90 mg/dL (ref 65–99)
POTASSIUM: 3.9 mmol/L (ref 3.5–5.1)
Sodium: 137 mmol/L (ref 135–145)

## 2016-09-27 LAB — CBC
HCT: 33.1 % — ABNORMAL LOW (ref 36.0–46.0)
Hemoglobin: 10.7 g/dL — ABNORMAL LOW (ref 12.0–15.0)
MCH: 29.9 pg (ref 26.0–34.0)
MCHC: 32.3 g/dL (ref 30.0–36.0)
MCV: 92.5 fL (ref 78.0–100.0)
PLATELETS: 223 10*3/uL (ref 150–400)
RBC: 3.58 MIL/uL — ABNORMAL LOW (ref 3.87–5.11)
RDW: 13.6 % (ref 11.5–15.5)
WBC: 9.3 10*3/uL (ref 4.0–10.5)

## 2016-09-27 MED ORDER — MAGNESIUM CITRATE PO SOLN
1.0000 | Freq: Once | ORAL | Status: AC
  Administered 2016-09-27: 1 via ORAL
  Filled 2016-09-27: qty 296

## 2016-09-27 MED ORDER — ALLOPURINOL 300 MG PO TABS
150.0000 mg | ORAL_TABLET | Freq: Every day | ORAL | Status: DC
  Administered 2016-09-27 – 2016-09-29 (×3): 150 mg via ORAL
  Filled 2016-09-27 (×3): qty 1

## 2016-09-27 NOTE — Progress Notes (Signed)
PROGRESS NOTE    Valerie [name]  AJG:[id number] DOB: 10-18-1960 DOA: 09/23/2016 PCP: Delman Cheadle, MD   Brief Narrative: Valerie [name] is a 56 y.o. female with a history of ischemic colitis, hypertension, fibromyalgia, and chronic pain. She presented with abdominal pain and was found to have evidence of ischemic colitis with hematochezia. SHe was started on antibiotics.   Assessment & Plan:   Principal Problem:   Colitis, regional, with rectal bleeding ([place]) Active Problems:   Leukocytosis   Fibromyalgia   Obesity, morbid ([facility])   OSA on CPAP   AKI (acute kidney injury) ([place])   Lower GI bleed   Dehydration   Impaired glucose tolerance   Ischemic colitis Improving -GI recommendations: sigmoidoscopy in AM -continue ciprofloxacin and Flagyl  Dehydration Resolved with fluids  Fibromyalgia Chronic pain Continue home MS Contin  Essential hypertension -continue to hold antihypertensives  Anxiety -continue alprazolam  GERD -continue PPI   DVT prophylaxis: SCDs Code Status: Full code Family Communication: None at bedside Disposition Plan: Pending GI workup   Consultants:   Gastroenterology  Procedures:   None  Antimicrobials:  Ciprofloxacin  Flagyl    Subjective: No bleeding overnight. Feeling somewhat better.  Objective: Vitals:   09/26/16 1500 09/26/16 2100 09/27/16 0543 09/27/16 1432  BP: (!) 144/81 (!) 151/89 138/85 140/89  Pulse: 72 78 61 71  Resp: 18  18   Temp: 98.9 F (37.2 C) 98.3 F (36.8 C) 98.3 F (36.8 C) 98.3 F (36.8 C)  TempSrc: Oral  Oral Oral  SpO2: 97% 100% 100% 99%  Weight:      Height:       No intake or output data in the 24 hours ending 09/27/16 1707 Filed Weights   09/24/16 0427  Weight: 131.1 kg (289 lb)    Examination:  General exam: Appears calm and comfortable Respiratory system: Clear to auscultation. Respiratory effort normal. Cardiovascular system: S1 & S2 heard, RRR. No murmurs, rubs, gallops or  clicks. Gastrointestinal system: Abdomen is nondistended, soft, right sided abdominal tenderness. Normal bowel sounds heard. Central nervous system: Alert and oriented. No focal neurological deficits. Extremities: No edema. No calf tenderness Skin: No cyanosis. No rashes Psychiatry: Judgement and insight appear normal. Mood & affect appropriate.     Data Reviewed: I have personally reviewed following labs and imaging studies  CBC:  Recent Labs Lab 09/23/16 2305 09/24/16 0629 09/24/16 1206 09/25/16 0515 09/27/16 0648  WBC 16.7* 11.3*  --  7.7 9.3  HGB 14.0 11.4* 11.5* 11.4* 10.7*  HCT 41.7 34.9* 35.3* 35.0* 33.1*  MCV 90.7 91.1  --  90.0 92.5  PLT 332 235  --  245 355   Basic Metabolic Panel:  Recent Labs Lab 09/23/16 2305 09/24/16 0629 09/25/16 0515 09/26/16 0632 09/27/16 0648  NA 137 139 138 141 137  K 3.8 3.7 3.9 3.7 3.9  CL 105 108 107 110 105  CO2 18* 22 24 22 25   GLUCOSE 142* 96 123* 117* 90  BUN 23* 19 9 7 6   CREATININE 1.40* 1.10* 1.00 1.09* 1.08*  CALCIUM 10.0 8.6* 8.7* 8.8* 8.5*  MG  --   --   --  1.3*  --    GFR: Estimated Creatinine Clearance: 81.4 mL/min (A) (by C-G formula based on SCr of 1.08 mg/dL (H)). Liver Function Tests:  Recent Labs Lab 09/23/16 2305 09/24/16 0629  AST 21 14*  ALT 17 13*  ALKPHOS 45 35*  BILITOT 0.8 0.8  PROT 8.5* 6.9  ALBUMIN 4.7 3.7  Recent Labs Lab 09/24/16 0629  LIPASE 33   No results for input(s): AMMONIA in the last 168 hours. Coagulation Profile: No results for input(s): INR, PROTIME in the last 168 hours. Cardiac Enzymes: No results for input(s): CKTOTAL, CKMB, CKMBINDEX, TROPONINI in the last 168 hours. BNP (last 3 results) No results for input(s): PROBNP in the last 8760 hours. HbA1C: No results for input(s): HGBA1C in the last 72 hours. CBG: No results for input(s): GLUCAP in the last 168 hours. Lipid Profile: No results for input(s): CHOL, HDL, LDLCALC, TRIG, CHOLHDL, LDLDIRECT in the last  72 hours. Thyroid Function Tests: No results for input(s): TSH, T4TOTAL, FREET4, T3FREE, THYROIDAB in the last 72 hours. Anemia Panel: No results for input(s): VITAMINB12, FOLATE, FERRITIN, TIBC, IRON, RETICCTPCT in the last 72 hours. Sepsis Labs:  Recent Labs Lab 09/24/16 0000  LATICACIDVEN 1.2    Recent Results (from the past 240 hour(s))  Urine culture     Status: None   Collection Time: 09/18/16  4:07 PM  Result Value Ref Range Status   Urine Culture, Routine Final report  Final   Urine Culture result 1 No growth  Final         Radiology Studies: No results found.      Scheduled Meds: . allopurinol  150 mg Oral Daily  . fluticasone  2 spray Each Nare Daily  . magnesium citrate  1 Bottle Oral Once  . morphine  30 mg Oral Q8H  . olopatadine  1 drop Both Eyes Daily  . pantoprazole (PROTONIX) IV  40 mg Intravenous Q12H   Continuous Infusions: . ciprofloxacin    . metronidazole    . sodium chloride 0.9 % 1,000 mL with potassium chloride 20 mEq infusion 100 mL/hr at 09/27/16 0516     LOS: 3 days     Cordelia Poche, MD [facility] 09/27/2016, 5:07 PM Pager: [phone]  If 7PM-7AM, please contact night-coverage [email or web address] Password [facility] 09/27/2016, 5:07 PM

## 2016-09-27 NOTE — Progress Notes (Signed)
Patient on home CPAP.  

## 2016-09-27 NOTE — Progress Notes (Signed)
Subjective: Still having abdominal pain and blood in stool.  Objective: Vital signs in last 24 hours: Temp:  [98.3 F (36.8 C)-98.9 F (37.2 C)] 98.3 F (36.8 C) (04/18 0543) Pulse Rate:  [61-78] 61 (04/18 0543) Resp:  [18] 18 (04/18 0543) BP: (138-151)/(81-89) 138/85 (04/18 0543) SpO2:  [97 %-100 %] 100 % (04/18 0543) Weight change:  Last BM Date: 09/26/16  PE: GEN:  Overweight, NAD ABD:  Protuberant, redundant, lower abdominal (R>L) tenderness, bowel sounds present.  Lab Results: CBC    Component Value Date/Time   WBC 9.3 09/27/2016 0648   RBC 3.58 (L) 09/27/2016 0648   HGB 10.7 (L) 09/27/2016 0648   HCT 33.1 (L) 09/27/2016 0648   HCT 38.9 07/31/2016 1412   PLT 223 09/27/2016 0648   PLT 300 07/31/2016 1412   MCV 92.5 09/27/2016 0648   MCV 92 07/31/2016 1412   MCH 29.9 09/27/2016 0648   MCHC 32.3 09/27/2016 0648   RDW 13.6 09/27/2016 0648   RDW 14.3 07/31/2016 1412   LYMPHSABS 2,720 01/10/2016 1504   MONOABS 680 01/10/2016 1504   EOSABS 170 01/10/2016 1504   BASOSABS 85 01/10/2016 1504   CMP     Component Value Date/Time   NA 137 09/27/2016 0648   NA 143 09/18/2016 1607   K 3.9 09/27/2016 0648   CL 105 09/27/2016 0648   CO2 25 09/27/2016 0648   GLUCOSE 90 09/27/2016 0648   BUN 6 09/27/2016 0648   BUN 19 09/18/2016 1607   CREATININE 1.08 (H) 09/27/2016 0648   CREATININE 1.07 (H) 04/14/2016 1259   CALCIUM 8.5 (L) 09/27/2016 0648   PROT 6.9 09/24/2016 0629   PROT 7.6 09/18/2016 1607   ALBUMIN 3.7 09/24/2016 0629   ALBUMIN 4.5 09/18/2016 1607   AST 14 (L) 09/24/2016 0629   ALT 13 (L) 09/24/2016 0629   ALKPHOS 35 (L) 09/24/2016 0629   BILITOT 0.8 09/24/2016 0629   BILITOT <0.2 09/18/2016 1607   GFRNONAA 57 (L) 09/27/2016 0648   GFRNONAA 53 (L) 01/21/2016 1018   GFRAA >60 09/27/2016 0648   GFRAA 61 01/21/2016 1018   Assessment:  1. Abdominal pain, lower abdominal, slow to improve. 2. Blood in stool, recurrent. 3. Abnormal CT scan abdomen/pelvis,  thickening in descending colon. Overall constellation of findings most consistent with recurrent ischemic colitis.  Plan:  1.  Flex sig tomorrow. 2.  Hold off on further dietary advancement for the time-being. 3.  [facility] will follow.   Valerie [name] 09/27/2016, 1:37 PM   Pager [phone] If no answer or after 5 PM call [phone]

## 2016-09-28 ENCOUNTER — Inpatient Hospital Stay (HOSPITAL_COMMUNITY): Payer: Medicare Other | Admitting: Registered Nurse

## 2016-09-28 ENCOUNTER — Encounter (HOSPITAL_COMMUNITY): Payer: Self-pay | Admitting: *Deleted

## 2016-09-28 ENCOUNTER — Encounter (HOSPITAL_COMMUNITY)
Admission: EM | Disposition: A | Discharge status: Home / Self Care | Payer: Self-pay | Source: Home / Self Care | Attending: Family Medicine

## 2016-09-28 HISTORY — PX: FLEXIBLE SIGMOIDOSCOPY: SHX5431

## 2016-09-28 SURGERY — SIGMOIDOSCOPY, FLEXIBLE
Anesthesia: Monitor Anesthesia Care | Laterality: Left

## 2016-09-28 MED ORDER — LACTATED RINGERS IV SOLN
INTRAVENOUS | Status: DC
  Administered 2016-09-28: 11:00:00 via INTRAVENOUS

## 2016-09-28 MED ORDER — PROPOFOL 10 MG/ML IV BOLUS
INTRAVENOUS | Status: AC
  Filled 2016-09-28: qty 40

## 2016-09-28 MED ORDER — METOPROLOL SUCCINATE ER 50 MG PO TB24
100.0000 mg | ORAL_TABLET | Freq: Every day | ORAL | Status: DC
  Administered 2016-09-28 – 2016-09-29 (×2): 100 mg via ORAL
  Filled 2016-09-28 (×2): qty 2

## 2016-09-28 MED ORDER — PROPOFOL 10 MG/ML IV BOLUS
INTRAVENOUS | Status: DC | PRN
  Administered 2016-09-28 (×2): 20 mg via INTRAVENOUS

## 2016-09-28 MED ORDER — PROPOFOL 500 MG/50ML IV EMUL
INTRAVENOUS | Status: DC | PRN
  Administered 2016-09-28: 100 ug/kg/min via INTRAVENOUS

## 2016-09-28 NOTE — Anesthesia Preprocedure Evaluation (Addendum)
Anesthesia Evaluation  Patient identified by MRN, date of birth, ID band Patient awake    Reviewed: Allergy & Precautions, NPO status , Patient's Chart, lab work & pertinent test results  Airway Mallampati: II  TM Distance: >3 FB Neck ROM: Full    Dental no notable dental hx.    Pulmonary asthma , sleep apnea ,    breath sounds clear to auscultation       Cardiovascular hypertension, + dysrhythmias  Rhythm:Regular Rate:Normal     Neuro/Psych  Neuromuscular disease    GI/Hepatic GERD  ,Hx of colitis   Endo/Other    Renal/GU Renal InsufficiencyRenal disease     Musculoskeletal  (+) Arthritis , Fibromyalgia -  Abdominal (+) + obese,   Peds  Hematology   Anesthesia Other Findings   Reproductive/Obstetrics                            Anesthesia Physical Anesthesia Plan  ASA: III  Anesthesia Plan: MAC   Post-op Pain Management:    Induction: Intravenous  Airway Management Planned: Natural Airway  Additional Equipment:   Intra-op Plan:   Post-operative Plan:   Informed Consent: I have reviewed the patients History and Physical, chart, labs and discussed the procedure including the risks, benefits and alternatives for the proposed anesthesia with the patient or authorized representative who has indicated his/her understanding and acceptance.     Plan Discussed with:   Anesthesia Plan Comments:         Anesthesia Quick Evaluation

## 2016-09-28 NOTE — Interval H&P Note (Signed)
History and Physical Interval Note:  09/28/2016 11:20 AM  Valerie [name]  has presented today for surgery, with the diagnosis of abdominal pain, hematochezia.  The various methods of treatment have been discussed with the patient and family. After consideration of risks, benefits and other options for treatment, the patient has consented to  Procedure(s): FLEXIBLE SIGMOIDOSCOPY (Left) as a surgical intervention .  The patient's history has been reviewed, patient examined, no change in status, stable for surgery.  I have reviewed the patient's chart and labs.  Questions were answered to the patient's satisfaction.     Yaak C.

## 2016-09-28 NOTE — Progress Notes (Signed)
PROGRESS NOTE    Valerie [name]  FXT:[id number] DOB: 1960-06-16 DOA: 09/23/2016 PCP: Delman Cheadle, MD   Brief Narrative: Valerie [name] is a 56 y.o. female with a history of ischemic colitis, hypertension, fibromyalgia, and chronic pain. She presented with abdominal pain and was found to have evidence of ischemic colitis with hematochezia. SHe was started on antibiotics.   Assessment & Plan:   Principal Problem:   Colitis, regional, with rectal bleeding ([facility]) Active Problems:   Leukocytosis   Fibromyalgia   Obesity, morbid ([facility])   OSA on CPAP   AKI (acute kidney injury) ([place])   Lower GI bleed   Dehydration   Impaired glucose tolerance   Ischemic colitis Improving. Had flexible sigmoidoscopy this morning significant for diverticulosis, hemorrhoids, erythematous/congested mucosa. Pathology obtained. -GI recommendations: advance diet -continue ciprofloxacin and Flagyl  Dehydration Resolved with fluids  Fibromyalgia Chronic pain Continue home MS Contin  Essential hypertension -restart metoprolol  Anxiety -continue alprazolam  GERD -continue PPI   DVT prophylaxis: SCDs Code Status: Full code Family Communication: None at bedside Disposition Plan: Pending GI workup   Consultants:   Gastroenterology  Procedures:   None  Antimicrobials:  Ciprofloxacin  Flagyl    Subjective: Abdominal pain improved. Very mild nausea. No emesis. No hematochezia.  Objective: Vitals:   09/28/16 0851 09/28/16 1036 09/28/16 1205 09/28/16 1300  BP: 133/85 (!) 147/47 136/74 (!) 144/83  Pulse: 62  80 62  Resp:  19 14 16   Temp: 98.6 F (37 C) 98 F (36.7 C) 97.6 F (36.4 C) 98.2 F (36.8 C)  TempSrc: Oral Oral  Oral  SpO2: 99% 100% 100% 100%  Weight:      Height:        Intake/Output Summary (Last 24 hours) at 09/28/16 1640 Last data filed at 09/28/16 1546  Gross per 24 hour  Intake          4276.67 ml  Output                0 ml  Net          4276.67 ml     Filed Weights   09/24/16 0427  Weight: 131.1 kg (289 lb)    Examination:  General exam: Appears calm and comfortable Respiratory system: Clear to auscultation. Respiratory effort normal. Cardiovascular system: S1 & S2 heard, RRR. No murmurs. Gastrointestinal system: Abdomen is nondistended, soft, minimal right sided abdominal tenderness. Normal bowel sounds heard. Central nervous system: Alert and oriented. No focal neurological deficits. Extremities: No edema. No calf tenderness Skin: No cyanosis. No rashes Psychiatry: Judgement and insight appear normal. Mood & affect appropriate.     Data Reviewed: I have personally reviewed following labs and imaging studies  CBC:  Recent Labs Lab 09/23/16 2305 09/24/16 0629 09/24/16 1206 09/25/16 0515 09/27/16 0648  WBC 16.7* 11.3*  --  7.7 9.3  HGB 14.0 11.4* 11.5* 11.4* 10.7*  HCT 41.7 34.9* 35.3* 35.0* 33.1*  MCV 90.7 91.1  --  90.0 92.5  PLT 332 235  --  245 299   Basic Metabolic Panel:  Recent Labs Lab 09/23/16 2305 09/24/16 0629 09/25/16 0515 09/26/16 0632 09/27/16 0648  NA 137 139 138 141 137  K 3.8 3.7 3.9 3.7 3.9  CL 105 108 107 110 105  CO2 18* 22 24 22 25   GLUCOSE 142* 96 123* 117* 90  BUN 23* 19 9 7 6   CREATININE 1.40* 1.10* 1.00 1.09* 1.08*  CALCIUM 10.0 8.6* 8.7* 8.8* 8.5*  MG  --   --   --  1.3*  --    GFR: Estimated Creatinine Clearance: 81.4 mL/min (A) (by C-G formula based on SCr of 1.08 mg/dL (H)). Liver Function Tests:  Recent Labs Lab 09/23/16 2305 09/24/16 0629  AST 21 14*  ALT 17 13*  ALKPHOS 45 35*  BILITOT 0.8 0.8  PROT 8.5* 6.9  ALBUMIN 4.7 3.7    Recent Labs Lab 09/24/16 0629  LIPASE 33   No results for input(s): AMMONIA in the last 168 hours. Coagulation Profile: No results for input(s): INR, PROTIME in the last 168 hours. Cardiac Enzymes: No results for input(s): CKTOTAL, CKMB, CKMBINDEX, TROPONINI in the last 168 hours. BNP (last 3 results) No results for input(s):  PROBNP in the last 8760 hours. HbA1C: No results for input(s): HGBA1C in the last 72 hours. CBG: No results for input(s): GLUCAP in the last 168 hours. Lipid Profile: No results for input(s): CHOL, HDL, LDLCALC, TRIG, CHOLHDL, LDLDIRECT in the last 72 hours. Thyroid Function Tests: No results for input(s): TSH, T4TOTAL, FREET4, T3FREE, THYROIDAB in the last 72 hours. Anemia Panel: No results for input(s): VITAMINB12, FOLATE, FERRITIN, TIBC, IRON, RETICCTPCT in the last 72 hours. Sepsis Labs:  Recent Labs Lab 09/24/16 0000  LATICACIDVEN 1.2    No results found for this or any previous visit (from the past 240 hour(s)).       Radiology Studies: No results found.      Scheduled Meds: . allopurinol  150 mg Oral Daily  . fluticasone  2 spray Each Nare Daily  . metoprolol succinate  100 mg Oral Daily  . morphine  30 mg Oral Q8H  . olopatadine  1 drop Both Eyes Daily  . pantoprazole (PROTONIX) IV  40 mg Intravenous Q12H   Continuous Infusions: . ciprofloxacin    . metronidazole    . sodium chloride 0.9 % 1,000 mL with potassium chloride 20 mEq infusion 100 mL/hr at 09/28/16 0723     LOS: 4 days     Cordelia Poche, MD [facility] 09/28/2016, 4:40 PM Pager: [phone]  If 7PM-7AM, please contact night-coverage [email or web address] Password [email or web address] 09/28/2016, 4:40 PM

## 2016-09-28 NOTE — Care Management Important Message (Signed)
Important Message  Patient Details  Name: TIEARA FLITTON MRN: [id number] Date of Birth: Mar 03, 1961   Medicare Important Message Given:  Yes    Kerin Salen 09/28/2016, 10:22 AMImportant Message  Patient Details  Name: JOLETTE LANA MRN: [id number] Date of Birth: 01-11-1961   Medicare Important Message Given:  Yes    Kerin Salen 09/28/2016, 10:22 AM

## 2016-09-28 NOTE — Op Note (Signed)
[facility] Patient Name: Valerie [name] Procedure Date: 09/28/2016 MRN: [id number] Attending MD: Lear Ng , MD Date of Birth: 08-11-1960 CSN: [id number] Age: 56 Admit Type: Outpatient Procedure:                Flexible Sigmoidoscopy Indications:              Generalized abdominal pain, Hematochezia, Diarrhea Providers:                Lear Ng, MD, Zenon Mayo, RN,                            William Dalton, Technician Referring MD:              Medicines:                Propofol per Anesthesia, Monitored Anesthesia Care Complications:            No immediate complications. Estimated Blood Loss:     Estimated blood loss was minimal. Procedure:                Pre-Anesthesia Assessment:                           - Prior to the procedure, a History and Physical                            was performed, and patient medications and                            allergies were reviewed. The patient's tolerance of                            previous anesthesia was also reviewed. The risks                            and benefits of the procedure and the sedation                            options and risks were discussed with the patient.                            All questions were answered, and informed consent                            was obtained. Prior Anticoagulants: The patient has                            taken no previous anticoagulant or antiplatelet                            agents. ASA Grade Assessment: III - A patient with                            severe systemic disease. After reviewing the risks  and benefits, the patient was deemed in                            satisfactory condition to undergo the procedure.                           After obtaining informed consent, the scope was                            passed under direct vision. The EC-[id number] ([id number])                            scope was introduced  through the anus and advanced                            to the the left transverse colon. The flexible                            sigmoidoscopy was accomplished without difficulty.                            The patient tolerated the procedure well. The                            quality of the bowel preparation was fair. Scope In: 11:43:13 AM Scope Out: 11:52:38 AM Total Procedure Duration: 0 hours 9 minutes 25 seconds  Findings:      The perianal and digital rectal examinations were normal.      A patchy area of mildly congested and erythematous mucosa was found in       the sigmoid colon. Biopsies were taken with a cold forceps for       histology. Estimated blood loss was minimal.      A few small-mouthed diverticula were found in the sigmoid colon.      Internal hemorrhoids were found during retroflexion. The hemorrhoids       were medium-sized and Grade I (internal hemorrhoids that do not       prolapse). Impression:               - Preparation of the colon was fair.                           - Congested and erythematous mucosa in the sigmoid                            colon. Biopsied.                           - Diverticulosis in the sigmoid colon.                           - Internal hemorrhoids. Moderate Sedation:      N/A- Per Anesthesia Care Recommendation:           - Await pathology results.                           -  Advance diet as tolerated and soft diet. Procedure Code(s):        --- Professional ---                           [phone], Sigmoidoscopy, flexible; with biopsy, single                            or multiple Diagnosis Code(s):        --- Professional ---                           K92.1, Melena (includes Hematochezia)                           R19.7, Diarrhea, unspecified                           K63.89, Other specified diseases of intestine                           R10.84, Generalized abdominal pain                           K64.0, First degree  hemorrhoids                           K57.30, Diverticulosis of large intestine without                            perforation or abscess without bleeding CPT copyright 2016 [employer]. All rights reserved. The codes documented in this report are preliminary and upon coder review may  be revised to meet current compliance requirements. Lear Ng, MD 09/28/2016 12:20:48 PM This report has been signed electronically. Number of Addenda: 0

## 2016-09-28 NOTE — Progress Notes (Signed)
Pt has CPAP from home and prefers self placement.  RT to monitor and assess as needed.

## 2016-09-28 NOTE — Brief Op Note (Signed)
  Minimal patchy erythema in sigmoid likely due to resolving mild ischemic colitis. Biopsies taken. Few diverticuli seen. Internal hemorrhoids noted. Advance diet. Home today ok if can tolerate diet. F/U on path as outpt. Will sign off. Call if questions.

## 2016-09-28 NOTE — H&P (View-Only) (Signed)
Subjective: Still having abdominal pain and blood in stool.  Objective: Vital signs in last 24 hours: Temp:  [98.3 F (36.8 C)-98.9 F (37.2 C)] 98.3 F (36.8 C) (04/18 0543) Pulse Rate:  [61-78] 61 (04/18 0543) Resp:  [18] 18 (04/18 0543) BP: (138-151)/(81-89) 138/85 (04/18 0543) SpO2:  [97 %-100 %] 100 % (04/18 0543) Weight change:  Last BM Date: 09/26/16  PE: GEN:  Overweight, NAD ABD:  Protuberant, redundant, lower abdominal (R>L) tenderness, bowel sounds present.  Lab Results: CBC    Component Value Date/Time   WBC 9.3 09/27/2016 0648   RBC 3.58 (L) 09/27/2016 0648   HGB 10.7 (L) 09/27/2016 0648   HCT 33.1 (L) 09/27/2016 0648   HCT 38.9 07/31/2016 1412   PLT 223 09/27/2016 0648   PLT 300 07/31/2016 1412   MCV 92.5 09/27/2016 0648   MCV 92 07/31/2016 1412   MCH 29.9 09/27/2016 0648   MCHC 32.3 09/27/2016 0648   RDW 13.6 09/27/2016 0648   RDW 14.3 07/31/2016 1412   LYMPHSABS 2,720 01/10/2016 1504   MONOABS 680 01/10/2016 1504   EOSABS 170 01/10/2016 1504   BASOSABS 85 01/10/2016 1504   CMP     Component Value Date/Time   NA 137 09/27/2016 0648   NA 143 09/18/2016 1607   K 3.9 09/27/2016 0648   CL 105 09/27/2016 0648   CO2 25 09/27/2016 0648   GLUCOSE 90 09/27/2016 0648   BUN 6 09/27/2016 0648   BUN 19 09/18/2016 1607   CREATININE 1.08 (H) 09/27/2016 0648   CREATININE 1.07 (H) 04/14/2016 1259   CALCIUM 8.5 (L) 09/27/2016 0648   PROT 6.9 09/24/2016 0629   PROT 7.6 09/18/2016 1607   ALBUMIN 3.7 09/24/2016 0629   ALBUMIN 4.5 09/18/2016 1607   AST 14 (L) 09/24/2016 0629   ALT 13 (L) 09/24/2016 0629   ALKPHOS 35 (L) 09/24/2016 0629   BILITOT 0.8 09/24/2016 0629   BILITOT <0.2 09/18/2016 1607   GFRNONAA 57 (L) 09/27/2016 0648   GFRNONAA 53 (L) 01/21/2016 1018   GFRAA >60 09/27/2016 0648   GFRAA 61 01/21/2016 1018   Assessment:  1. Abdominal pain, lower abdominal, slow to improve. 2. Blood in stool, recurrent. 3. Abnormal CT scan abdomen/pelvis,  thickening in descending colon. Overall constellation of findings most consistent with recurrent ischemic colitis.  Plan:  1.  Flex sig tomorrow. 2.  Hold off on further dietary advancement for the time-being. 3.  [facility] will follow.   Landry Dyke 09/27/2016, 1:37 PM   Pager [phone] If no answer or after 5 PM call [phone]

## 2016-09-28 NOTE — Transfer of Care (Signed)
Immediate Anesthesia Transfer of Care Note  Patient: Valerie [name]  Procedure(s) Performed: Procedure(s): FLEXIBLE SIGMOIDOSCOPY (Left)  Patient Location: PACU  Anesthesia Type:MAC  Level of Consciousness: awake, alert , oriented and patient cooperative  Airway & Oxygen Therapy: Patient Spontanous Breathing and Patient connected to face mask oxygen  Post-op Assessment: Report given to RN, Post -op Vital signs reviewed and stable and Patient moving all extremities X 4  Post vital signs: stable  Last Vitals:  Vitals:   09/28/16 0851 09/28/16 1036  BP: 133/85 (!) 147/47  Pulse: 62   Resp:  19  Temp: 37 C 36.7 C    Last Pain:  Vitals:   09/28/16 1036  TempSrc: Oral  PainSc: 1       Patients Stated Pain Goal: 0 (60/73/71 0626)  Complications: No apparent anesthesia complications

## 2016-09-28 NOTE — Anesthesia Postprocedure Evaluation (Addendum)
Anesthesia Post Note  Patient: Valerie [name]  Procedure(s) Performed: Procedure(s) (LRB): FLEXIBLE SIGMOIDOSCOPY (Left)  Patient location during evaluation: Endoscopy Anesthesia Type: MAC Level of consciousness: awake and alert Pain management: pain level controlled Vital Signs Assessment: post-procedure vital signs reviewed and stable Respiratory status: spontaneous breathing, nonlabored ventilation, respiratory function stable and patient connected to nasal cannula oxygen Cardiovascular status: stable and blood pressure returned to baseline Anesthetic complications: no       Last Vitals:  Vitals:   09/28/16 1205 09/28/16 1300  BP: 136/74 (!) 144/83  Pulse: 80 62  Resp: 14 16  Temp: 36.4 C 36.8 C    Last Pain:  Vitals:   09/28/16 1300  TempSrc: Oral  PainSc:                  Exavior Kimmons,JAMES TERRILL

## 2016-09-29 ENCOUNTER — Encounter (HOSPITAL_COMMUNITY): Payer: Self-pay | Admitting: Gastroenterology

## 2016-09-29 MED ORDER — CIPROFLOXACIN HCL 500 MG PO TABS: 500.0000 mg | ORAL_TABLET | Freq: Two times a day (BID) | ORAL | 0 refills | Status: AC

## 2016-09-29 MED ORDER — METRONIDAZOLE 500 MG PO TABS: 500.0000 mg | ORAL_TABLET | Freq: Three times a day (TID) | ORAL | 0 refills | Status: AC

## 2016-09-29 NOTE — Discharge Instructions (Signed)
Valerie [name],  You were admitted because of your colitis. This may be infectious but there is a chance this could be related to an autoimmune process as your rheumatologist has been working up. Please continue your antibiotics and follow-up with Dr. Amedeo Plenty as an outpatient.

## 2016-09-29 NOTE — Discharge Summary (Signed)
Physician Discharge Summary  Valerie [name] ASN:[id number] DOB: [birth year] DOA: 09/23/2016  PCP: Delman Cheadle, MD  Admit date: 09/23/2016 Discharge date: 09/29/2016  Admitted From: Home Disposition: Home  Recommendations for Outpatient Follow-up:  1. Follow up with PCP in 1 week 2. Follow up with Gastroenterology 3. Continuing antibiotics for 10 day course 4. Follow up on labs: Sigmoid colon biopsy  Home Health: None Equipment/Devices: None  Discharge Condition: Stable CODE STATUS: Full code Diet recommendation: Soft diet   Brief/Interim Summary:  Admission HPI written by Norval Morton, MD   Chief Complaint:  Rectal bleeding  HPI: Valerie [name] is a 56 y.o. female with medical history significant of recurrent pancreatitis, recurrent UTIs, CKD stage III, H/O ischemic colitis, chronic pain, OSA on CPAP; who presents with complaints of rectal bleeding since yesterday at noon. Reports acute onset of crampy lower abdominal pain that was constant with urge to have bowel movement. The patient was diaphoretic and lightheaded. The initial bowel movement was brown and normally formed. Shortly thereafter patient noted subsequent bowel movements which were loose with bright red blood blood around the stool looked like ketchup. Associated symptoms include generalized weakness and nausea. Denies any chest pain, shortness of breath, vomiting, dysuria, or fever. Patient took MS Contin for pain and reports taking Phenergan earlier in the day for nausea symptoms. Patient reports having at least 7 bloody bowel movements prior to arrival. Patient notes similar symptoms back in 2013 when she was diagnosed with ischemic colitis. She is followed by Dr. Amedeo Plenty of gastroenterology outpatient setting. Prior to arrival Dr. Paulita Fujita on-call GI was contacted.   ED Course: Upon admission into into the emergency department patient was found to be afebrile, pulse 62-105, respirations 10-18, and all other vital  signs relatively maintained. Labs revealed WBC 16.7, BUN 23, creatinine 1.4. Patient was found to be stool guiac positive. CT of the abdomen showed signs of thickening along the descending colon suggestive of infectious/inflammatory process.    Hospital course:  Colitis Differential includes ischemic vs infectious vs autoimmune mediated. Started on ciprofloxacin and metronidazole. Hematochezia improved and gastroenterology performed a flexible sigmoidoscopy which was significant for congested/erythematous mucosa, diverticulosis and internal hemorrhoids. Discharged on a continued course of antibiotics.  Dehydration Resolved with fluids  Fibromyalgia Chronic pain Continue home MS Contin  Essential hypertension Metoprolol  Anxiety Continued alprazolam  GERD Continue PPI  Discharge Diagnoses:  Principal Problem:   Colitis, regional, with rectal bleeding ([place]) Active Problems:   Leukocytosis   Fibromyalgia   Obesity, morbid ([facility])   OSA on CPAP   AKI (acute kidney injury) ([place])   Lower GI bleed   Dehydration   Impaired glucose tolerance    Discharge Instructions  Discharge Instructions    Call MD for:  extreme fatigue    Complete by:  As directed    Call MD for:  persistant dizziness or light-headedness    Complete by:  As directed    Call MD for:  persistant nausea and vomiting    Complete by:  As directed    Call MD for:  severe uncontrolled pain    Complete by:  As directed    Call MD for:  temperature >100.4    Complete by:  As directed    Increase activity slowly    Complete by:  As directed      Allergies as of 09/29/2016      Reactions   Dilaudid [hydromorphone Hcl] Other (See Comments)   Headache    Pregabalin  Other (See Comments)   Drug induced stupor   Metoclopramide Hcl Other (See Comments)   "crawling inside"   Neurontin [gabapentin] Other (See Comments)   Tremors    Other Other (See Comments)   Avoid all SSRI's and SNRI's- damaged  pancreas      Medication List    STOP taking these medications   baclofen 10 MG tablet Commonly known as:  LIORESAL   predniSONE 1 MG tablet Commonly known as:  DELTASONE     TAKE these medications   allopurinol 300 MG tablet Commonly known as:  ZYLOPRIM Take 0.5 tablets (150 mg total) by mouth daily.   ALPRAZolam 1 MG tablet Commonly known as:  XANAX Take 1 tablet (1 mg total) by mouth 3 (three) times daily as needed for anxiety.   augmented betamethasone dipropionate 0.05 % cream Commonly known as:  DIPROLENE-AF Apply topically 2 (two) times daily. What changed:  how much to take  when to take this  reasons to take this   benazepril 40 MG tablet Commonly known as:  LOTENSIN Take 1 tablet (40 mg total) by mouth daily.   Biotin 5000 MCG Caps Take 10,000 mcg by mouth daily.   calcium carbonate 1500 (600 Ca) MG Tabs tablet Commonly known as:  OSCAL Take 600 mg of elemental calcium by mouth daily with breakfast.   cetirizine 10 MG tablet Commonly known as:  ZYRTEC Take 10 mg by mouth daily. Alternates between Allegra and Zyrtec as needed for allergies   ciprofloxacin 500 MG tablet Commonly known as:  CIPRO Take 1 tablet (500 mg total) by mouth 2 (two) times daily. Start taking on:  09/30/2016   CREON 24000-76000 units Cpep Generic drug:  Pancrelipase (Lip-Prot-Amyl) Take 72,000 Units by mouth 3 (three) times daily as needed (for pancreatitis).   dexlansoprazole 60 MG capsule Commonly known as:  DEXILANT Take 60 mg by mouth 2 (two) times daily.   fexofenadine 180 MG tablet Commonly known as:  ALLEGRA Take 180 mg by mouth daily as needed. Switches between Allegra and Zyrtec as needed for allergies   fluticasone 50 MCG/ACT nasal spray Commonly known as:  FLONASE Place 2 sprays into both nostrils daily.   HYDROcodone-acetaminophen 10-325 MG tablet Commonly known as:  NORCO Take 1-2 tablets by mouth every 6 (six) hours as needed for moderate pain  (pain.).   metoprolol succinate 100 MG 24 hr tablet Commonly known as:  TOPROL-XL Take 1 tablet (100 mg total) by mouth daily. Take with or immediately following a meal.   metroNIDAZOLE 500 MG tablet Commonly known as:  FLAGYL Take 1 tablet (500 mg total) by mouth 3 (three) times daily. Start taking on:  09/30/2016   modafinil 200 MG tablet Commonly known as:  PROVIGIL Take 0.5 tablets (100 mg total) by mouth 2 (two) times daily.   montelukast 10 MG tablet Commonly known as:  SINGULAIR TAKE 1 TABLET (10 MG TOTAL) BY MOUTH AT BEDTIME   morphine 30 MG 12 hr tablet Commonly known as:  MS CONTIN Take 30 mg by mouth every 8 (eight) hours. Not from [facility]   MULTIVITAMIN PO Take 1 tablet by mouth daily.   Olopatadine HCl 0.7 % Soln Commonly known as:  PAZEO Apply 1 drop to eye daily.   PROAIR HFA 108 (90 Base) MCG/ACT inhaler Generic drug:  albuterol INHALE 2 PUFFS INTO THE LUNGS AS NEEDED FOR WHEEZING OR SHORTNESS OF BREATH.   promethazine 25 MG tablet Commonly known as:  PHENERGAN Take 1 tablet (25  mg total) by mouth every 6 (six) hours as needed for nausea or vomiting.   spironolactone 100 MG tablet Commonly known as:  ALDACTONE Take 1 tablet (100 mg total) by mouth daily.   sucralfate 1 GM/10ML suspension Commonly known as:  CARAFATE Take 10 mLs (1 g total) by mouth 4 (four) times daily as needed (stomach pain).      Follow-up Information    SHAW,EVA, MD. Schedule an appointment as soon as possible for a visit in 1 week(s).   Specialty:  Family Medicine Contact information: [place] [address] [phone]        Missy Sabins, MD Follow up.   Specialty:  Gastroenterology Contact information: [address] [address] [phone]          Allergies  Allergen Reactions  . Dilaudid [Hydromorphone Hcl] Other (See Comments)    Headache   . Pregabalin Other (See Comments)    Drug induced stupor  . Metoclopramide  Hcl Other (See Comments)    "crawling inside"  . Neurontin [Gabapentin] Other (See Comments)    Tremors   . Other Other (See Comments)    Avoid all SSRI's and SNRI's- damaged pancreas    Consultations:  Gastroenterology   Procedures/Studies: Ct Abdomen Pelvis W Contrast  Result Date: 09/24/2016 CLINICAL DATA:  Acute onset of abdominal pain and cramping. Dizziness and near syncope. Bright red blood per rectum. Initial encounter. EXAM: CT ABDOMEN AND PELVIS WITH CONTRAST TECHNIQUE: Multidetector CT imaging of the abdomen and pelvis was performed using the standard protocol following bolus administration of intravenous contrast. CONTRAST:  72mL ISOVUE-300 IOPAMIDOL (ISOVUE-300) INJECTION 61% COMPARISON:  CT of the abdomen pelvis from 06/16/2015, and MRCP performed 07/27/2015 FINDINGS: Lower chest: The visualized lung bases are grossly clear. The visualized portions of the mediastinum are unremarkable. Hepatobiliary: The liver is unremarkable in appearance. The patient is status post cholecystectomy, with clips noted at the gallbladder fossa. The common bile duct remains normal in caliber. Pancreas: The pancreas is within normal limits. Spleen: The spleen is unremarkable in appearance. Adrenals/Urinary Tract: The adrenal glands are unremarkable in appearance. The kidneys are within normal limits. There is no evidence of hydronephrosis. No renal or ureteral stones are identified. No perinephric stranding is seen. Stomach/Bowel: The stomach is unremarkable in appearance. The small bowel is within normal limits. The appendix is normal in caliber, without evidence of appendicitis. Mild wall thickening is noted along the descending colon, with minimal associated soft tissue inflammation, raising question for a mild infectious or inflammatory process. Vascular/Lymphatic: The abdominal aorta is unremarkable in appearance. The inferior vena cava is grossly unremarkable. No retroperitoneal lymphadenopathy is  seen. No pelvic sidewall lymphadenopathy is identified. Reproductive: The bladder is mildly distended and within normal limits. The uterus is grossly unremarkable in appearance. The ovaries are relatively symmetric. No suspicious adnexal masses are seen. Other: No additional soft tissue abnormalities are seen. Musculoskeletal: No acute osseous abnormalities are identified. The visualized musculature is unremarkable in appearance. IMPRESSION: Mild wall thickening along the descending colon, with minimal associated soft tissue inflammation, raising question for a mild infectious or inflammatory process. Electronically Signed   By: Garald Balding M.D.   On: 09/24/2016 01:32   Mm Screening Breast Tomo Bilateral  Result Date: 09/26/2016 CLINICAL DATA:  Screening. EXAM: 2D DIGITAL SCREENING BILATERAL MAMMOGRAM WITH CAD AND ADJUNCT TOMO COMPARISON:  None. ACR Breast Density Category b: There are scattered areas of fibroglandular density. FINDINGS: There are no findings suspicious for  malignancy. Images were processed with CAD. IMPRESSION: No mammographic evidence of malignancy. A result letter of this screening mammogram will be mailed directly to the patient. RECOMMENDATION: Screening mammogram in one year. (Code:SM-B-01Y) BI-RADS CATEGORY  1: Negative. Electronically Signed   By: Nolon Nations M.D.   On: 09/26/2016 13:01     EGD (09/28/2016)  Impression: - Preparation of the colon was fair. - Congested and erythematous mucosa in the sigmoid colon. Biopsied. - Diverticulosis in the sigmoid colon. - Internal hemorrhoids.  Recommendation: - Await pathology results.  - Advance diet as tolerated and soft diet.   Subjective: Mild nausea. No hematochezia. No emesis.  Discharge Exam: Vitals:   09/29/16 0854 09/29/16 1014  BP: 133/78 133/78  Pulse: 75 75  Resp: 17   Temp: 98 F (36.7 C)    Vitals:   09/28/16 2037 09/29/16 0536 09/29/16 0854 09/29/16 1014  BP: 127/77 139/87 133/78 133/78   Pulse: 69 72 75 75  Resp: 18 16 17    Temp: 98.5 F (36.9 C) 98.1 F (36.7 C) 98 F (36.7 C)   TempSrc: Oral Oral Oral   SpO2: 99% 99% 98%   Weight:      Height:        General: Pt is alert, awake, not in acute distress Cardiovascular: RRR, S1/S2 +, no rubs, no gallops Respiratory: CTA bilaterally, no wheezing, no rhonchi Abdominal: Soft, NT, ND, bowel sounds + Extremities: no edema, no cyanosis    The results of significant diagnostics from this hospitalization (including imaging, microbiology, ancillary and laboratory) are listed below for reference.     Microbiology: No results found for this or any previous visit (from the past 240 hour(s)).   Labs: BNP (last 3 results) No results for input(s): BNP in the last 8760 hours. Basic Metabolic Panel:  Recent Labs Lab 09/23/16 2305 09/24/16 0629 09/25/16 0515 09/26/16 0632 09/27/16 0648  NA 137 139 138 141 137  K 3.8 3.7 3.9 3.7 3.9  CL 105 108 107 110 105  CO2 18* 22 24 22 25   GLUCOSE 142* 96 123* 117* 90  BUN 23* 19 9 7 6   CREATININE 1.40* 1.10* 1.00 1.09* 1.08*  CALCIUM 10.0 8.6* 8.7* 8.8* 8.5*  MG  --   --   --  1.3*  --    Liver Function Tests:  Recent Labs Lab 09/23/16 2305 09/24/16 0629  AST 21 14*  ALT 17 13*  ALKPHOS 45 35*  BILITOT 0.8 0.8  PROT 8.5* 6.9  ALBUMIN 4.7 3.7    Recent Labs Lab 09/24/16 0629  LIPASE 33   No results for input(s): AMMONIA in the last 168 hours. CBC:  Recent Labs Lab 09/23/16 2305 09/24/16 0629 09/24/16 1206 09/25/16 0515 09/27/16 0648  WBC 16.7* 11.3*  --  7.7 9.3  HGB 14.0 11.4* 11.5* 11.4* 10.7*  HCT 41.7 34.9* 35.3* 35.0* 33.1*  MCV 90.7 91.1  --  90.0 92.5  PLT 332 235  --  245 223   Cardiac Enzymes: No results for input(s): CKTOTAL, CKMB, CKMBINDEX, TROPONINI in the last 168 hours. BNP: Invalid input(s): POCBNP CBG: No results for input(s): GLUCAP in the last 168 hours. D-Dimer No results for input(s): DDIMER in the last 72 hours. Hgb  A1c No results for input(s): HGBA1C in the last 72 hours. Lipid Profile No results for input(s): CHOL, HDL, LDLCALC, TRIG, CHOLHDL, LDLDIRECT in the last 72 hours. Thyroid function studies No results for input(s): TSH, T4TOTAL, T3FREE, THYROIDAB in the last 72 hours.  Invalid input(s): FREET3 Anemia work up No results for input(s): VITAMINB12, FOLATE, FERRITIN, TIBC, IRON, RETICCTPCT in the last 72 hours. Urinalysis    Component Value Date/Time   COLORURINE YELLOW 12/28/2015 1100   APPEARANCEUR CLOUDY (A) 12/28/2015 1100   LABSPEC 1.016 12/28/2015 1100   PHURINE 5.5 12/28/2015 1100   GLUCOSEU NEGATIVE 12/28/2015 1100   HGBUR MODERATE (A) 12/28/2015 1100   BILIRUBINUR negative 09/18/2016 1517   BILIRUBINUR negative 12/27/2014 0942   KETONESUR negative 09/18/2016 1517   KETONESUR 40 (A) 12/28/2015 1100   PROTEINUR negative 09/18/2016 1517   PROTEINUR NEGATIVE 12/28/2015 1100   UROBILINOGEN 0.2 09/18/2016 1517   UROBILINOGEN 0.2 02/03/2014 2022   NITRITE Negative 09/18/2016 1517   NITRITE NEGATIVE 12/28/2015 1100   LEUKOCYTESUR Negative 09/18/2016 1517   Sepsis Labs Invalid input(s): PROCALCITONIN,  WBC,  LACTICIDVEN Microbiology No results found for this or any previous visit (from the past 240 hour(s)).   Time coordinating discharge: Over 30 minutes  SIGNED:   Cordelia Poche, MD [facility] 09/29/2016, 12:33 PM Pager [phone]  If 7PM-7AM, please contact night-coverage [email or web address] Password [email or web address]

## 2016-10-02 ENCOUNTER — Ambulatory Visit: Payer: Medicare Other | Admitting: Neurology

## 2016-10-02 DIAGNOSIS — M6282 Rhabdomyolysis: Secondary | ICD-10-CM | POA: Diagnosis not present

## 2016-10-02 DIAGNOSIS — R5383 Other fatigue: Secondary | ICD-10-CM | POA: Diagnosis not present

## 2016-10-02 DIAGNOSIS — M79604 Pain in right leg: Secondary | ICD-10-CM | POA: Diagnosis not present

## 2016-10-02 DIAGNOSIS — M2550 Pain in unspecified joint: Secondary | ICD-10-CM | POA: Diagnosis not present

## 2016-10-02 DIAGNOSIS — R768 Other specified abnormal immunological findings in serum: Secondary | ICD-10-CM | POA: Diagnosis not present

## 2016-10-02 DIAGNOSIS — G609 Hereditary and idiopathic neuropathy, unspecified: Secondary | ICD-10-CM | POA: Diagnosis not present

## 2016-10-02 DIAGNOSIS — N179 Acute kidney failure, unspecified: Secondary | ICD-10-CM | POA: Diagnosis not present

## 2016-10-03 DIAGNOSIS — M542 Cervicalgia: Secondary | ICD-10-CM | POA: Diagnosis not present

## 2016-10-08 NOTE — Progress Notes (Signed)
Subjective:    Patient ID: Valerie [name], female    DOB: 01-Jun-1961, 56 y.o.   MRN: [id number] Chief Complaint  Patient presents with  . Follow-up    Hospital f/u (colitis)    HPI   Valerie [name] is a 56 year old woman here to follow up from her recent 5 day hospitalization for regional colitis with rectal bleeding and acute kidney injury due to dehydration. She was discharged 10 days prior on a 10 day antibiotic course of Cipro 500 mg bid and metronidazole 520m tid with a sigmoid colon biopsy still pending. Unknown if the colitis was ischemic versus infectious versus autoimmune mediated. Initially WBCs were up to 16 with hemoglobin of 14 which dropped to 10.7 by the day of hosp discharge (with wbc 7.7->9.3). However hematochezia improved after patient was started on ciprofloxacin and metronidazole. Flexible sigmoidoscopy showed inflammation with congestion and erythematous sigmoid colonic mucosa that was biopsied as well as diverticulosis and internal hemorrhoids.  The biopsy returned showing the very early or acute stages of ischemic colitis. However it is interesting that the biopsy was taken 5 days after hospitalization for symptoms so I would have expected the ischemic colitis pathology to have progressed some by that point, not still be very early in the process, with more progressed findings of necrosis etc.  The GI doctor who did it told her that her colon looked like it was healing. She has an appt with Dr. HAmedeo Plentyon 5/16. She is back to a normal diet but not much appetite.  History of ischemic colitis: Followed by Dr. HAmedeo Plenty  Recurrent pancreatitis: Last flare was July 2017 and very mild. Creon 3 times a day prn but hasn't needed in a long time, prn promethazine. Lipase has remained at high normal ranging from 50-69 with normal high of 51 over the past 5 years and was actually at its lowest at 33 during patient's hospitalization GERD with severe gastroparesis: Followed by Dr. HAmedeo Plentyat  equal GI. treated with annual Botox injections as couldn't tolerate reglan - last done November 2017. PPI Dexilant 60 twice a day, carafate suspension qid prn - has only  Needed once in the past week, promethazine when necessary.  IBS: Recurrent UTIs and interstitial cystitis: Chronic kidney disease stage III: [facility]   [facility] Hypertension: toprol 100, benazepril 40 mg, spironolactone 1041m checks her BP at home and usually runs 110-112/68-70. Morbid obesity: Prediabetes: hgba1c 6.4 -> 6.2 Diffuse uric acid joint flairs: She has never had gout but she states all of her joints hurt when her uric acid level is up so prefers to stay on allopurinol 150 mg daily. Chronic environmental allergic rhinitis and allergic conjunctivitis: Allegra versus Zyrtec, fluticasone, singulair, pazeo eye gtts qd (failed pataday), proair prn.  Anemia: Fibromyalgia: [facility] recurrent right sided neck pain triggering muscle tension headaches  Chronic back pain syndrome: Followed at pain management with records sent and scanned into media tab. On MS Contin 3066mid, hydrocodone 36m101mxiety/panic: Alprazolam 1 mg written for 3 times a day prn but will usually go for several weeks in between needing to take an alprazolam. However, during times of stress, use will increase up to 4-5x/wk.  Valerie [name] if she uses the alprazolam. PTSD: Chronic fatigue: Provigil 100mg52m from Dr. DohmeBrett Fairy who pt has an appointment with in sev d. Psoriasis:  prev controlled on lotrisone prn but breakthrough sxs so stepped up to betamethasone (Diprolene-AF) at last visit Microscopic polyangitis: that started in March 2017- with abnml urinalysis and lower extremity pain. is seeing nephrologist - Dr. Pearson Grippe and seeing Dr. Graylin Shiver at [place] as debating what is going on as she has had  multiple conflicting diagnosis. Was on low-dose prolonged daily prednisone but has been able to wean off in the past several months and overall her sxs have been sig improving. Last ESR 01/2016 nml at 28.  She had ARF several weeks after going off prednisone last July 2017 and then several weeks after going off prednisone most recently she had ischemic colitis was able to take the diagnosis of microscopic polyangitis!!!!  Planning to start her on methotrexate as soon as her LFTs go back down to normal (prob from flagyl).  She has not restarted on prednisone yet.  SHe finished the flagyl and cipro 5d ago and so needs repeat cmp to see if she can start the methotrexate yet.  Neurology Dr[facility] Gilford Rile at [facility]  Calcium carbonate, biotin, mvi - did have a low magnesium sev d prior to hosp d/c at 1.3 (nml 1.7-2.4), not rechecked  Urologist.  Past Medical History:  Diagnosis Date  . Acute ischemic colitis ([place])   . Anemia 05/01/2012  . Anxiety   . ARF (acute renal failure) ([place]) 12/28/2015  . Arthritis    "basically ~ q joint" (12/28/2015)  . Asthma   . Basal cell carcinoma of upper back ~ 2011  . Chronic lower back pain   . DDD (degenerative disc disease), lumbar   . Diarrhea    EPISODIC  . Fibromyalgia   . First degree heart block   . Gastro-esophageal reflux   . Headache(784.0)    "muscular w/migraine intensity; ~ 5-6 months" (12/28/2015)  . Hypertension   . Mental disorder   . Nerve pain    nerve damage to right arm  . Neuromuscular disorder ([place])   . Pancreatitis   . Pneumonia ~ 2014; 08/2015  . Polymyalgia rheumatica ([place]) onset 08/2015  . Precancerous melanosis    "2nd toe left foot; 5th toe of right foot; right temple; had them all cut off"  . Sleep apnea    CPAP-9   Past Surgical History:  Procedure Laterality Date  . BASAL CELL CARCINOMA EXCISION  ~ 2011   back  . BOTOX INJECTION N/A 11/06/2012   Procedure: BOTOX INJECTION;  Surgeon: Missy Sabins, MD;  Location: [facility];   Service: Endoscopy;  Laterality: N/A;  . BOTOX INJECTION N/A 12/04/2013   Procedure: BOTOX INJECTION;  Surgeon: Missy Sabins, MD;  Location: [place];  Service: Endoscopy;  Laterality: N/A;  . BOTOX INJECTION N/A 02/26/2014   Procedure: BOTOX INJECTION;  Surgeon: Missy Sabins, MD;  Location: [place];  Service: Endoscopy;  Laterality: N/A;  . BOTOX INJECTION N/A 07/08/2015   Procedure: BOTOX INJECTION;  Surgeon: Teena Irani, MD;  Location: [facility];  Service: Endoscopy;  Laterality: N/A;  . BOTOX INJECTION N/A 04/27/2016   Procedure: BOTOX INJECTION;  Surgeon: Teena Irani, MD;  Location: [facility];  Service: Endoscopy;  Laterality: N/A;  . ESOPHAGOGASTRODUODENOSCOPY  12/21/2011   Procedure: ESOPHAGOGASTRODUODENOSCOPY (EGD);  Surgeon: Missy Sabins, MD;  [facility]COPY;  Service: Endoscopy;  Laterality: N/A;  . ESOPHAGOGASTRODUODENOSCOPY N/A 11/06/2012   Procedure: ESOPHAGOGASTRODUODENOSCOPY (EGD);  Surgeon: Missy Sabins, MD;  Location: [facility];  Service: Endoscopy;  Laterality: N/A;  .  ESOPHAGOGASTRODUODENOSCOPY N/A 12/04/2013   Procedure: ESOPHAGOGASTRODUODENOSCOPY (EGD);  Surgeon: Missy Sabins, MD;  Location: [facility] ENDOSCOPY;  Service: Endoscopy;  Laterality: N/A;  . ESOPHAGOGASTRODUODENOSCOPY N/A 02/26/2014   Procedure: ESOPHAGOGASTRODUODENOSCOPY (EGD);  Surgeon: Missy Sabins, MD;  Location: [facility] ENDOSCOPY;  Service: Endoscopy;  Laterality: N/A;  . ESOPHAGOGASTRODUODENOSCOPY (EGD) WITH PROPOFOL N/A 07/08/2015   Procedure: ESOPHAGOGASTRODUODENOSCOPY (EGD) WITH PROPOFOL;  Surgeon: Teena Irani, MD;  [facility]: [facility];  Service: Endoscopy;  Laterality: N/A;  . ESOPHAGOGASTRODUODENOSCOPY (EGD) WITH PROPOFOL N/A 04/27/2016   Procedure: ESOPHAGOGASTRODUODENOSCOPY (EGD) WITH PROPOFOL;  Surgeon: Teena Irani, MD;  Location: [facility];  Service: Endoscopy;  Laterality: N/A;  . FLEXIBLE SIGMOIDOSCOPY  05/01/2012   Procedure: FLEXIBLE SIGMOIDOSCOPY;  Surgeon: Jeryl Columbia, MD;   Location: [facility];  Service: Endoscopy;  Laterality: N/A;  unprepped  . FLEXIBLE SIGMOIDOSCOPY Left 09/28/2016   Procedure: FLEXIBLE SIGMOIDOSCOPY;  Surgeon: Wilford Corner, MD;  Location: [facility];  Service: Endoscopy;  Laterality: Left;  . LAPAROSCOPIC CHOLECYSTECTOMY  01/2002  . NASAL SEPTOPLASTY W/ TURBINOPLASTY  1998  . SHOULDER ARTHROSCOPY WITH ROTATOR CUFF REPAIR AND OPEN BICEPS TENODESIS Bilateral 2010-2013   left-right   Current Outpatient Prescriptions on File Prior to Visit  Medication Sig Dispense Refill  . allopurinol (ZYLOPRIM) 300 MG tablet Take 0.5 tablets (150 mg total) by mouth daily. 90 tablet 1  . ALPRAZolam (XANAX) 1 MG tablet Take 1 tablet (1 mg total) by mouth 3 (three) times daily as needed for anxiety. 90 tablet 1  . augmented betamethasone dipropionate (DIPROLENE-AF) 0.05 % cream Apply topically 2 (two) times daily. (Patient taking differently: Apply 1 application topically 2 (two) times daily as needed (palm). ) 50 g 3  . benazepril (LOTENSIN) 40 MG tablet Take 1 tablet (40 mg total) by mouth daily. 90 tablet 3  . Biotin 5000 MCG CAPS Take 10,000 mcg by mouth daily.     . calcium carbonate (OSCAL) 1500 (600 Ca) MG TABS tablet Take 600 mg of elemental calcium by mouth daily with breakfast.    . cetirizine (ZYRTEC) 10 MG tablet Take 10 mg by mouth daily. Alternates between Allegra and Zyrtec as needed for allergies    . dexlansoprazole (DEXILANT) 60 MG capsule Take 60 mg by mouth 2 (two) times daily.    . fexofenadine (ALLEGRA) 180 MG tablet Take 180 mg by mouth daily as needed. Switches between Allegra and Zyrtec as needed for allergies    . fluticasone (FLONASE) 50 MCG/ACT nasal spray Place 2 sprays into both nostrils daily. 48 g 2  . HYDROcodone-acetaminophen (NORCO) 10-325 MG per tablet Take 1-2 tablets by mouth every 6 (six) hours as needed for moderate pain (pain.).     Marland Kitchen metoprolol succinate (TOPROL-XL) 100 MG 24 hr tablet Take 1 tablet (100 mg total) by  mouth daily. Take with or immediately following a meal. 90 tablet 3  . montelukast (SINGULAIR) 10 MG tablet TAKE 1 TABLET (10 MG TOTAL) BY MOUTH AT BEDTIME 90 tablet 3  . [name] (MS CONTIN) 30 MG 12 hr tablet Take 30 mg by mouth every 8 (eight) hours. Not from [place]    . Multiple Vitamins-Minerals (MULTIVITAMIN PO) Take 1 tablet by mouth daily.    . Olopatadine HCl (PAZEO) 0.7 % SOLN Apply 1 drop to eye daily. 2.5 mL 11  . Pancrelipase, Lip-Prot-Amyl, (CREON) 24000 UNITS CPEP Take 72,000 Units by mouth 3 (three) times daily as needed (for pancreatitis).     Marland Kitchen PROAIR HFA 108 (90 Base) MCG/ACT inhaler INHALE 2  PUFFS INTO THE LUNGS AS NEEDED FOR WHEEZING OR SHORTNESS OF BREATH. 8.5 each 0  . promethazine (PHENERGAN) 25 MG tablet Take 1 tablet (25 mg total) by mouth every 6 (six) hours as needed for nausea or vomiting. 30 tablet 1  . spironolactone (ALDACTONE) 100 MG tablet Take 1 tablet (100 mg total) by mouth daily. 90 tablet 3  . sucralfate (CARAFATE) 1 GM/10ML suspension Take 10 mLs (1 g total) by mouth 4 (four) times daily as needed (stomach pain). 420 mL 1   No current facility-administered medications on file prior to visit.    Allergies  Allergen Reactions  . Dilaudid [Hydromorphone Hcl] Other (See Comments)    Headache   . Pregabalin Other (See Comments)    Drug induced stupor  . Metoclopramide Hcl Other (See Comments)    "crawling inside"  . Neurontin [Gabapentin] Other (See Comments)    Tremors   . Other Other (See Comments)    Avoid all SSRI's and SNRI's- damaged pancreas   Family History  Problem Relation Age of Onset  . Heart disease Mother   . COPD Mother   . Arthritis Mother   . Cancer Mother   . Hypertension Mother   . Arthritis Paternal Grandmother   . Heart disease Paternal Grandmother   . Heart disease Paternal Grandfather    Social History   Social History  . Marital status: Single    Spouse name: N/A  . Number of children: 0  . Years of education: College     Occupational History  . disabled    Social History Main Topics  . Smoking status: Never Smoker  . Smokeless tobacco: Never Used  . Alcohol use No  . Drug use: No  . Sexual activity: Not Currently   Other Topics Concern  . None   Social History Narrative   Caffeine 2 cups iced tea daily avg.   Depression screen [facility] 2/9 10/09/2016 09/18/2016 07/31/2016 04/15/2016 04/14/2016  Decreased Interest 0 0 0 0 0  Down, Depressed, Hopeless 0 0 0 0 0  PHQ - 2 Score 0 0 0 0 0  Altered sleeping - - - - -  Tired, decreased energy - - - - -  Change in appetite - - - - -  Feeling bad or failure about yourself  - - - - -  Trouble concentrating - - - - -  Moving slowly or fidgety/restless - - - - -  Suicidal thoughts - - - - -  PHQ-9 Score - - - - -    Review of Systems See hpi    Objective:   Physical Exam  Constitutional: She is oriented to person, place, and time. She appears well-developed and well-nourished. No distress.  HENT:  Head: Normocephalic and atraumatic.  Neck: Normal range of motion. Neck supple. No thyromegaly present.  Cardiovascular: Normal rate, regular rhythm, normal heart sounds and intact distal pulses.   Pulmonary/Chest: Effort normal and breath sounds normal. No respiratory distress.  Abdominal: Soft. Bowel sounds are normal. There is tenderness. There is no rebound, no guarding and no CVA tenderness.  Musculoskeletal: She exhibits no edema.  Lymphadenopathy:    She has no cervical adenopathy.  Neurological: She is alert and oriented to person, place, and time.  Skin: Skin is warm and dry. She is not diaphoretic. No erythema.  Psychiatric: Her behavior is normal. She exhibits a depressed mood.    BP 131/82   Pulse 76   Temp 98.1 F (36.7 C) (Oral)  Resp 18   Ht '5\' 5"'  (1.651 m)   Wt 288 lb 9.6 oz (130.9 kg)   SpO2 97%   BMI 48.03 kg/m   Assessment & Plan:  Labs faxed to Dr. Graylin Shiver at [facility] Fax: [phone]  1. Ischemic colitis ([place])    2. Gastroparesis   3. Acute blood loss anemia -  cmp, cbc, mag, ua  4. Hypocalcemia  - was normal until during patient's hospitalization when progressively decreased to 8.5  5. Hypomagnesemia   6. CKD (chronic kidney disease) stage 3, GFR 30-59 ml/min     Orders Placed This Encounter  Procedures  . Comprehensive metabolic panel  . CBC with Differential/Platelet  . Magnesium  . Ferritin  . Iron and TIBC  . Comprehensive metabolic panel  . Care order/instruction:    Scheduling Instructions:     Complete orders, AVS and go.  Marland Kitchen POCT urinalysis dipstick   Over 40 min spent in face-to-face evaluation of and consultation with patient and coordination of care.  Over 50% of this time was spent counseling this patient.   Delman Cheadle, M.D.  Primary Care at [facility] [address] [place], [place] [address] [phone] phone [phone] fax  10/23/16 1:34 PM

## 2016-10-08 NOTE — Assessment & Plan Note (Addendum)
On dexilant 60mg  bid with prn sucralfate and promethazine. Has been very well controlled recently and asymptomatic so also not needed the carafate. Followed by Dr. Amedeo Plenty

## 2016-10-09 ENCOUNTER — Ambulatory Visit (INDEPENDENT_AMBULATORY_CARE_PROVIDER_SITE_OTHER): Payer: Medicare Other | Admitting: Family Medicine

## 2016-10-09 ENCOUNTER — Encounter: Payer: Self-pay | Admitting: Family Medicine

## 2016-10-09 VITALS — BP 131/82 | HR 76 | Temp 98.1°F | Resp 18 | Ht 65.0 in | Wt 288.6 lb

## 2016-10-09 DIAGNOSIS — D62 Acute posthemorrhagic anemia: Secondary | ICD-10-CM

## 2016-10-09 DIAGNOSIS — K3184 Gastroparesis: Secondary | ICD-10-CM | POA: Diagnosis not present

## 2016-10-09 DIAGNOSIS — N183 Chronic kidney disease, stage 3 unspecified: Secondary | ICD-10-CM

## 2016-10-09 DIAGNOSIS — K559 Vascular disorder of intestine, unspecified: Secondary | ICD-10-CM

## 2016-10-09 LAB — POCT URINALYSIS DIP (MANUAL ENTRY)
Bilirubin, UA: NEGATIVE
Blood, UA: NEGATIVE
GLUCOSE UA: NEGATIVE mg/dL
Ketones, POC UA: NEGATIVE mg/dL
Leukocytes, UA: NEGATIVE
NITRITE UA: NEGATIVE
Protein Ur, POC: NEGATIVE mg/dL
Spec Grav, UA: 1.02 (ref 1.010–1.025)
UROBILINOGEN UA: 0.2 U/dL
pH, UA: 5.5 (ref 5.0–8.0)

## 2016-10-09 NOTE — Patient Instructions (Signed)
     IF you received an x-ray today, you will receive an invoice from [facility]. Please contact [facility] at [phone] with questions or concerns regarding your invoice.   IF you received labwork today, you will receive an invoice from [facility]. Please contact [facility] at [phone] with questions or concerns regarding your invoice.   Our billing staff will not be able to assist you with questions regarding bills from these companies.  You will be contacted with the lab results as soon as they are available. The fastest way to get your results is to activate your My Chart account. Instructions are located on the last page of this paperwork. If you have not heard from us regarding the results in 2 weeks, please contact this office.     

## 2016-10-10 ENCOUNTER — Telehealth: Payer: Self-pay | Admitting: Family Medicine

## 2016-10-10 DIAGNOSIS — M542 Cervicalgia: Secondary | ICD-10-CM | POA: Diagnosis not present

## 2016-10-10 LAB — IRON AND TIBC
IRON: 68 ug/dL (ref 27–159)
Iron Saturation: 22 % (ref 15–55)
TIBC: 315 ug/dL (ref 250–450)
UIBC: 247 ug/dL (ref 131–425)

## 2016-10-10 LAB — CBC WITH DIFFERENTIAL/PLATELET
BASOS: 1 %
Basophils Absolute: 0 10*3/uL (ref 0.0–0.2)
EOS (ABSOLUTE): 0.2 10*3/uL (ref 0.0–0.4)
EOS: 2 %
HEMATOCRIT: 39.7 % (ref 34.0–46.6)
Hemoglobin: 13.3 g/dL (ref 11.1–15.9)
IMMATURE GRANULOCYTES: 0 %
Immature Grans (Abs): 0 10*3/uL (ref 0.0–0.1)
LYMPHS ABS: 2.6 10*3/uL (ref 0.7–3.1)
Lymphs: 30 %
MCH: 30.4 pg (ref 26.6–33.0)
MCHC: 33.5 g/dL (ref 31.5–35.7)
MCV: 91 fL (ref 79–97)
Monocytes Absolute: 0.8 10*3/uL (ref 0.1–0.9)
Monocytes: 9 %
Neutrophils Absolute: 5.1 10*3/uL (ref 1.4–7.0)
Neutrophils: 58 %
Platelets: 331 10*3/uL (ref 150–379)
RBC: 4.38 x10E6/uL (ref 3.77–5.28)
RDW: 14.1 % (ref 12.3–15.4)
WBC: 8.7 10*3/uL (ref 3.4–10.8)

## 2016-10-10 LAB — COMPREHENSIVE METABOLIC PANEL
ALK PHOS: 46 IU/L (ref 39–117)
ALT: 18 IU/L (ref 0–32)
AST: 15 IU/L (ref 0–40)
Albumin/Globulin Ratio: 1.6 (ref 1.2–2.2)
Albumin: 4.6 g/dL (ref 3.5–5.5)
BILIRUBIN TOTAL: 0.2 mg/dL (ref 0.0–1.2)
BUN / CREAT RATIO: 19 (ref 9–23)
BUN: 20 mg/dL (ref 6–24)
CHLORIDE: 98 mmol/L (ref 96–106)
CO2: 22 mmol/L (ref 18–29)
CREATININE: 1.07 mg/dL — AB (ref 0.57–1.00)
Calcium: 9.7 mg/dL (ref 8.7–10.2)
GFR calc Af Amer: 68 mL/min/{1.73_m2} (ref >59)
GFR calc non Af Amer: 59 mL/min/{1.73_m2} — ABNORMAL LOW (ref >59)
GLOBULIN, TOTAL: 2.8 g/dL (ref 1.5–4.5)
Glucose: 130 mg/dL — ABNORMAL HIGH (ref 65–99)
Potassium: 4.8 mmol/L (ref 3.5–5.2)
SODIUM: 137 mmol/L (ref 134–144)
Total Protein: 7.4 g/dL (ref 6.0–8.5)

## 2016-10-10 LAB — MAGNESIUM: MAGNESIUM: 1.8 mg/dL (ref 1.6–2.3)

## 2016-10-10 LAB — FERRITIN: FERRITIN: 91 ng/mL (ref 15–150)

## 2016-10-10 NOTE — Telephone Encounter (Signed)
PATIENT STATES SHE SAW DR. SHAW Monday (10/09/16) AND SHE DID A LOT OF BLOOD TEST ON HER. SHE WOULD LIKE TO KNOW AS SOON AS POSSIBLE WHAT HER LIVER ENZYMES WERE? HER RHEUMATOLOGIST (DR. Graylin Shiver) WOULD LIKE TO START HER TREATMENT AS SOON AS POSSIBLE BUT HER LEVELS HAVE TO BE DOWN. SHE SAID IT USUALLY TAKES SEVERAL DAYS FOR IT TO SHOW UP ON MY-CHART, BUT ABOUT 24 HOURS FOR DR. SHAW TO GET THE RESULTS. BEST PHONE [phone] (CELL) PHARMACY CHOICE IS [name] ON [address]. [place]

## 2016-10-10 NOTE — Telephone Encounter (Signed)
Done, sent results to [facility]

## 2016-10-10 NOTE — Telephone Encounter (Signed)
Liver ok See other abn labs

## 2016-10-11 ENCOUNTER — Encounter: Payer: Self-pay | Admitting: Neurology

## 2016-10-11 ENCOUNTER — Ambulatory Visit (INDEPENDENT_AMBULATORY_CARE_PROVIDER_SITE_OTHER): Payer: Medicare Other | Admitting: Neurology

## 2016-10-11 VITALS — BP 118/72 | HR 62 | Ht 65.0 in | Wt 290.0 lb

## 2016-10-11 DIAGNOSIS — M542 Cervicalgia: Secondary | ICD-10-CM | POA: Diagnosis not present

## 2016-10-11 DIAGNOSIS — R768 Other specified abnormal immunological findings in serum: Secondary | ICD-10-CM

## 2016-10-11 DIAGNOSIS — G4733 Obstructive sleep apnea (adult) (pediatric): Secondary | ICD-10-CM

## 2016-10-11 DIAGNOSIS — Z9989 Dependence on other enabling machines and devices: Secondary | ICD-10-CM | POA: Diagnosis not present

## 2016-10-11 MED ORDER — MODAFINIL 200 MG PO TABS: 100.0000 mg | ORAL_TABLET | Freq: Two times a day (BID) | ORAL | 1 refills | Status: DC

## 2016-10-11 NOTE — Progress Notes (Signed)
[facility]  Provider:  Dr Anan Dapolito Referring Provider: [name], MD Primary Care Physician:  [name], MD  Chief Complaint  Patient presents with  . Follow-up    cpap, just was in hospital for a GI bleed, ARF  This patient's primary neurologist is Dr. Floyde Parkins.   Interval history from 10/11/2016. 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 She had just been discharged after a prolonged hospitalization  On April 14 th - 21 2018, for bleeding. and in the meantime has actually learned her diagnosis of microscopic polyangiitis. This explains her renal disorder, fatigue, GI bleeds and overall pain. Her rheumatologist just started her on methotrexate. She continues now to use CPAP endorsed the Epworth sleepiness score at 11 points fatigue severity at 48 points, she is highly compliant patient uses the machine 93% of the last 30 days with an average of 8 hours and 1 minute, the machine is an AutoSet between 5 and 15 cm water pressure with 2 cm expiratory pressure relief. Residual AHI is 2.0 pressure at the 91st percentile is 10 cm water she does have mild to moderate air leaks.      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 Joseph Art for  OSA , diagnosed after a  sleep study on  12-22-2008 at [facility]. She underwent a SPLIT study 02-18-13  here at [place] . The patient underwent a retitration here at [facility] lab  on 9-9 2014,  with Dr. Joseph Art listed as the referring physician.  She could not tolerate the CPAP settings that she had moved here with from [place] in early 2010.  At the time at [facility] she was titrated to 9 cm water- now for night use later re\re titration resulted in a pressure of 20 and an AHI of  20.6 RDI of 33.8 REM AHI of 21.2. There was no tachybradycardia arrhythmia and altered no prolonged desaturation altered, and no periodic limb movements seen. The patient was now actually titrated to 12 cm water she is now using a ResMed Mirage mask in a small size and uses heated humidity. The machine is the same she had 2010 .  Mrs. Townley will need a prescription for the full facemask that she has used before and that she prefers using in times of nasal allergic rhinitis. She also will have cushions refilled for her nasal pillow mask. She reports that over the last week for 2 weeks she has become excessively daytime sleepy again currently she endorsed the Epworth sleepiness score on at 13 points and the fatigue severity score at 53-54 points. This is dated 08-19-14     Her sleep habits and social habits have not changed in any way to explain the increase in daytime sleepiness and fatigue. She takes Zyrtec in the morning during her allergy season which should not give her this degree of sleepiness. She had no infection or inflammatory disorders that she is aware of. He is easier short of breath and she does have some underlying wheezing which have been mentioned by Dr. Verline Lema. She never had wheezing before 2015 there of. This may be an asthmatic component and she has Proventil as an inhaler available .her voice in the choir is affected.  Her endurance span is shorter.  Social history :  She drinks caffeinated teas , 2-3 glasses at lunch and dinner. Juice for breakfast . She still naps in daytime, often right after taken modafinil.  Goes to bed around 11 PM, takes 30 minutes to 1 hour to  fall asleep.  Reads in bed, no TV.  CPAP has actually helped this a bit. Has one bathroom break at night , sleeps usually to 10 AM- 6-7 hours total sleep time. Sleep hygiene needs still improvement.  Dreams seldomly, not frequently, not vivid.    Interval history from 12/30/2015. Mrs. Carlo was just released yesterday  from a hospital stay necessitated by repeated loss of consciousness or loss of awareness, interpreted as sleep attacks!!!. She was admitted with acute renal failure and was confused. She states she spent the evening at a friend's house where she seemed to lose awareness of her surroundings, she reports felt falling asleep in the dry for. And neighbor knocked on the window of her car and woke her up. There have been several of these spells on Saturday last. By Monday she was admitted to hospital. This is not related to a sleep disorder with a metabolic problem with acute renal failure. 1. Confusion 2. Drowsiness 3. Dehydration 4. Abnormal urinalysis - Will cover patient for possible pyelonephritis with IM ceftriaxone given UA. We will also obtain head CT to r/o intracranial process. Consider using ciprofloxacin to supplement ceftriaxone. Urine culture pending. Differential includes meningitis, adverse effects from multiple strong pain medications/CS. 5. Chronic pain syndrome - Counseled on use of controlled substances, patient insists that this is not a contributing factor. P ANCA  Positive.     ROS , her Epworth score and her fatigue severity have not changed over the last 18 months. Patients with renal failure are still allowed to take modafinil as it is a hepatic metabolized medication. The same is true for her sleep and pain medication. Epworth 11,  FSS 48 ,  she is using Provigil with some success .  She is taking morphine 60 mg 3 times a day but she takes it only once a day and has been monitored by Dr. Verline Lema. She is actively followed by pain clinic.       Current Outpatient Prescriptions  Medication Sig Dispense Refill  . allopurinol (ZYLOPRIM) 300 MG tablet Take 0.5 tablets (150 mg total) by mouth daily. 90 tablet 1  . ALPRAZolam (XANAX) 1 MG tablet Take 1 tablet (1 mg total) by mouth 3 (three) times daily as needed for anxiety. 90 tablet 1  . augmented betamethasone dipropionate  (DIPROLENE-AF) 0.05 % cream Apply topically 2 (two) times daily. (Patient taking differently: Apply 1 application topically 2 (two) times daily as needed (palm). ) 50 g 3  . benazepril (LOTENSIN) 40 MG tablet Take 1 tablet (40 mg total) by mouth daily. 90 tablet 3  . Biotin 5000 MCG CAPS Take 10,000 mcg by mouth daily.     . calcium carbonate (OSCAL) 1500 (600 Ca) MG TABS tablet Take 600 mg of elemental calcium by mouth daily with breakfast.    . cetirizine (ZYRTEC) 10 MG tablet Take 10 mg by mouth daily. Alternates between Allegra and Zyrtec as needed for allergies    . dexlansoprazole (DEXILANT) 60 MG capsule Take 60 mg by mouth 2 (two) times daily.    . fexofenadine (ALLEGRA) 180 MG tablet Take 180 mg by mouth daily as needed. Switches between Allegra and Zyrtec as needed for allergies    .  fluticasone (FLONASE) 50 MCG/ACT nasal spray Place 2 sprays into both nostrils daily. 48 g 2  . folic acid (FOLVITE) 1 MG tablet Take 1 mg by mouth daily.    Marland Kitchen HYDROcodone-acetaminophen (NORCO) 10-325 MG per tablet Take 1-2 tablets by mouth every 6 (six) hours as needed for moderate pain (pain.).     Marland Kitchen methotrexate (RHEUMATREX) 2.5 MG tablet Take 10 mg by mouth once a week.    . metoprolol succinate (TOPROL-XL) 100 MG 24 hr tablet Take 1 tablet (100 mg total) by mouth daily. Take with or immediately following a meal. 90 tablet 3  . modafinil (PROVIGIL) 200 MG tablet Take 0.5 tablets (100 mg total) by mouth 2 (two) times daily. 90 tablet 1  . montelukast (SINGULAIR) 10 MG tablet TAKE 1 TABLET (10 MG TOTAL) BY MOUTH AT BEDTIME 90 tablet 3  . morphine (MS CONTIN) 30 MG 12 hr tablet Take 30 mg by mouth every 8 (eight) hours. Not from [place]    . Multiple Vitamins-Minerals (MULTIVITAMIN PO) Take 1 tablet by mouth daily.    . Olopatadine HCl (PAZEO) 0.7 % SOLN Apply 1 drop to eye daily. 2.5 mL 11  . Pancrelipase, Lip-Prot-Amyl, (CREON) 24000 UNITS CPEP Take 72,000 Units by mouth 3 (three) times daily as needed (for  pancreatitis).     Marland Kitchen PROAIR HFA 108 (90 Base) MCG/ACT inhaler INHALE 2 PUFFS INTO THE LUNGS AS NEEDED FOR WHEEZING OR SHORTNESS OF BREATH. 8.5 each 0  . promethazine (PHENERGAN) 25 MG tablet Take 1 tablet (25 mg total) by mouth every 6 (six) hours as needed for nausea or vomiting. 30 tablet 1  . spironolactone (ALDACTONE) 100 MG tablet Take 1 tablet (100 mg total) by mouth daily. 90 tablet 3  . sucralfate (CARAFATE) 1 GM/10ML suspension Take 10 mLs (1 g total) by mouth 4 (four) times daily as needed (stomach pain). 420 mL 1   No current facility-administered medications for this visit.     Allergies as of 10/11/2016 - Review Complete 10/11/2016  Allergen Reaction Noted  . Dilaudid [hydromorphone hcl] Other (See Comments) 08/01/2011  . Pregabalin Other (See Comments) 08/01/2011  . Metoclopramide hcl Other (See Comments) 08/01/2011  . Neurontin [gabapentin] Other (See Comments) 08/01/2011  . Other Other (See Comments) 08/01/2011   Creatinine, Ser 0.44 - 1.00 mg/dL 1.82 (H) 2.53 (H) 5.58 (H)R, CM 1.27 (H)R, CM     GFR calc non Af Amer >60 mL/min 31 (L) 60 mL/min" class="rz_1b" style="cursor: pointer;" onmouseover='jscript: var varStyle="underline"; var bgColor="#[id number]"; this.style.backgroundColor=bgColor; var children=this.getElementsByTagName("div"); for(var child=0;child 21 (L)      GFR calc Af Amer >60 mL/min 35 (L) 60 mL/min" class="rz_1b" style="cursor: pointer;" onmouseover='jscript: var varStyle="underline"; var bgColor="#[id number]"; this.style.backgroundColor=bgColor; var children=this.getElementsByTagName("div"); for(var child=0;child 24 (L)CM     Comments: (NOTE)  The eGFR has been calculated using the CKD EPI equation.  This calculation has not been validated in all clinical situations.  eGFR's persistently <60 mL/min signify possible Chronic Kidney  Disease.           Vitals: BP 118/72   Pulse 62   Ht _0  (1.651 m)   Wt 290 lb (131.5 kg)   BMI 48.26 kg/m  Last  Weight:  Wt Readings from Last 1 Encounters:  10/11/16 290 lb (131.5 kg)   Last Height:   Ht Readings from Last 1 Encounters:  10/11/16 _1  (1.651 m)   Vision Screening:  Vitals   Physical exam:  General: The patient is awake, alert and appears not in acute distress.  The patient is well groomed. Head: Normocephalic, atraumatic. Neck is supple. Large neck but low gradeupper airway restriction. Mallampati 1, narrow laterally , but high , peaked palate - short airway , may have trouble with intubation.   neck circumference:18 inches! . Cardiovascular:  Regular rate and rhythm, without  murmurs or carotid bruit, and without distended neck veins. Respiratory: Lungs are clear to auscultation. Skin:  Without evidence of edema, or rash Trunk: BMI is severely elevated , but  patient has normal posture. morbidly obese.   Neurologic exam : The patient is awake and alert, oriented to place and time.  Memory subjective  described as intact. There is a normal attention span & concentration ability.  Speech is fluent without dysarthria, dysphonia or aphasia. Mood and affect are appropriate.  Cranial nerves: Sense of smell is non-functional, she reports a metallic taste in her mouth. Tongue is " metallic ". Pupils are equal and briskly reactive to light. Funduscopic exam without evidence of pallor  (or edema). Extraocular movements  in vertical and horizontal planes intact and without nystagmus. Visual fields by finger perimetry are intact. Hearing to finger rub intact.   Facial sensation intact to fine touch. Facial motor strength is symmetric and tongue and uvula move midline.  Motor exam:   She reports pain in both legs with Valsalva!  Normal tone and normal muscle bulk and symmetric normal strength in all extremities. ROM limitation in  Right hip,  Left and right shoulder.  Sensory:  Fine touch, pinprick and vibration were tested in all extremities. Proprioception is tested in the upper  extremities only. This was normal. Coordination: Rapid alternating movements in the fingers/hands is tested and normal.  Gait and station: Patient walks without assistive device and is able unassisted to climb up to the exam table. Deep tendon reflexes: in the  upper and lower extremities are symmetric and intact. Babinski maneuver response is downgoing.   Assessment:  After physical and neurologic examination, review of laboratory studies, imaging, neurophysiology testing and pre-existing records, assessment is that :  OSA with CPAP at 12 cm water. Will change to auto papa , 8-15 cm water   peripheral neuropathy, brachial plexus injury - seen by Dr. Jannifer Franklin. Not improved .  chronic pain , in pain therapy - non narcotics from [facility] - related to autoimmune disorder.   Plan:  Continue CPAP and obtain new supplies DME : [facility] but has not received a call back- she needs to get her new tubing, filter and Mask.   She needs both liners refilled for FFM and nasal pillow.   [employer] CPAP  machine.   Visit 35 minutes with obtaining  ED notes, discussion of newly diagnosed autoimmune vasculitis. This explains, pain, arthralgia and renal disease,     Chrystian Ressler, MD   Cc Dr Jannifer Franklin, Dr Joseph Art

## 2016-10-11 NOTE — Addendum Note (Signed)
Addended by: Lester [place] A on: 10/11/2016 11:29 AM   Modules accepted: Orders

## 2016-10-11 NOTE — Addendum Note (Signed)
Addended by: Larey Seat on: 10/11/2016 11:26 AM   Modules accepted: Orders

## 2016-10-18 ENCOUNTER — Telehealth: Payer: Self-pay

## 2016-10-18 NOTE — Telephone Encounter (Signed)
I completed the modafinil pa and sent to [facility]. Should have a determination in 3-5 business days.

## 2016-10-18 NOTE — Telephone Encounter (Signed)
[employer] approved pa for modafinil from 06/10/2016 to 06/11/2017. Kristopher Oppenheim was notified via fax.

## 2016-10-23 ENCOUNTER — Encounter: Payer: Self-pay | Admitting: Family Medicine

## 2016-10-23 ENCOUNTER — Telehealth: Payer: Self-pay | Admitting: Family Medicine

## 2016-10-23 ENCOUNTER — Ambulatory Visit (INDEPENDENT_AMBULATORY_CARE_PROVIDER_SITE_OTHER): Payer: Medicare Other | Admitting: Family Medicine

## 2016-10-23 VITALS — BP 112/76 | HR 73 | Temp 98.0°F | Resp 16 | Ht 64.57 in | Wt 291.8 lb

## 2016-10-23 DIAGNOSIS — J0110 Acute frontal sinusitis, unspecified: Secondary | ICD-10-CM

## 2016-10-23 DIAGNOSIS — Z79899 Other long term (current) drug therapy: Secondary | ICD-10-CM | POA: Diagnosis not present

## 2016-10-23 DIAGNOSIS — R5383 Other fatigue: Secondary | ICD-10-CM | POA: Diagnosis not present

## 2016-10-23 LAB — POCT CBC
GRANULOCYTE PERCENT: 62.1 % (ref 37–80)
HCT, POC: 40.1 % (ref 37.7–47.9)
HEMOGLOBIN: 13.5 g/dL (ref 12.2–16.2)
Lymph, poc: 2.4 (ref 0.6–3.4)
MCH, POC: 30.4 pg (ref 27–31.2)
MCHC: 33.6 g/dL (ref 31.8–35.4)
MCV: 90.3 fL (ref 80–97)
MID (cbc): 0.5 (ref 0–0.9)
MPV: 8.4 fL (ref 0–99.8)
PLATELET COUNT, POC: 310 10*3/uL (ref 142–424)
POC Granulocyte: 4.7 (ref 2–6.9)
POC LYMPH PERCENT: 31.4 %L (ref 10–50)
POC MID %: 6.5 %M (ref 0–12)
RBC: 4.44 M/uL (ref 4.04–5.48)
RDW, POC: 14.2 %
WBC: 7.5 10*3/uL (ref 4.6–10.2)

## 2016-10-23 LAB — POCT URINALYSIS DIP (MANUAL ENTRY)
BILIRUBIN UA: NEGATIVE
BILIRUBIN UA: NEGATIVE mg/dL
Glucose, UA: NEGATIVE mg/dL
LEUKOCYTES UA: NEGATIVE
Nitrite, UA: NEGATIVE
PH UA: 6.5 (ref 5.0–8.0)
PROTEIN UA: NEGATIVE mg/dL
RBC UA: NEGATIVE
Urobilinogen, UA: 0.2 E.U./dL

## 2016-10-23 LAB — POCT SEDIMENTATION RATE: POCT SED RATE: 46 mm/h — AB (ref 0–22)

## 2016-10-23 MED ORDER — AMOXICILLIN-POT CLAVULANATE 875-125 MG PO TABS: 1.0000 | ORAL_TABLET | Freq: Two times a day (BID) | ORAL | 0 refills | Status: DC

## 2016-10-23 MED ORDER — GUAIFENESIN ER 1200 MG PO TB12: 1.0000 | ORAL_TABLET | Freq: Two times a day (BID) | ORAL | 1 refills | Status: DC | PRN

## 2016-10-23 NOTE — Patient Instructions (Addendum)
   IF you received an x-ray today, you will receive an invoice from [facility]. Please contact [facility] at [phone] with questions or concerns regarding your invoice.   IF you received labwork today, you will receive an invoice from [facility]. Please contact [facility] at [phone] with questions or concerns regarding your invoice.   Our billing staff will not be able to assist you with questions regarding bills from these companies.  You will be contacted with the lab results as soon as they are available. The fastest way to get your results is to activate your My Chart account. Instructions are located on the last page of this paperwork. If you have not heard from us regarding the results in 2 weeks, please contact this office.      Sinusitis, Adult Sinusitis is soreness and inflammation of your sinuses. Sinuses are hollow spaces in the bones around your face. Your sinuses are located:  Around your eyes.  In the middle of your forehead.  Behind your nose.  In your cheekbones. Your sinuses and nasal passages are lined with a stringy fluid (mucus). Mucus normally drains out of your sinuses. When your nasal tissues become inflamed or swollen, the mucus can become trapped or blocked so air cannot flow through your sinuses. This allows bacteria, viruses, and funguses to grow, which leads to infection. Sinusitis can develop quickly and last for 7?10 days (acute) or for more than 12 weeks (chronic). Sinusitis often develops after a cold. What are the causes? This condition is caused by anything that creates swelling in the sinuses or stops mucus from draining, including:  Allergies.  Asthma.  Bacterial or viral infection.  Abnormally shaped bones between the nasal passages.  Nasal growths that contain mucus (nasal polyps).  Narrow sinus openings.  Pollutants, such as chemicals or irritants in the air.  A foreign object stuck in the nose.  A fungal  infection. This is rare. What increases the risk? The following factors may make you more likely to develop this condition:  Having allergies or asthma.  Having had a recent cold or respiratory tract infection.  Having structural deformities or blockages in your nose or sinuses.  Having a weak immune system.  Doing a lot of swimming or diving.  Overusing nasal sprays.  Smoking. What are the signs or symptoms? The main symptoms of this condition are pain and a feeling of pressure around the affected sinuses. Other symptoms include:  Upper toothache.  Earache.  Headache.  Bad breath.  Decreased sense of smell and taste.  A cough that may get worse at night.  Fatigue.  Fever.  Thick drainage from your nose. The drainage is often green and it may contain pus (purulent).  Stuffy nose or congestion.  Postnasal drip. This is when extra mucus collects in the throat or back of the nose.  Swelling and warmth over the affected sinuses.  Sore throat.  Sensitivity to light. How is this diagnosed? This condition is diagnosed based on symptoms, a medical history, and a physical exam. To find out if your condition is acute or chronic, your health care provider may:  Look in your nose for signs of nasal polyps.  Tap over the affected sinus to check for signs of infection.  View the inside of your sinuses using an imaging device that has a light attached (endoscope). If your health care provider suspects that you have chronic sinusitis, you may also:  Be tested for allergies.  Have a sample of   mucus taken from your nose (nasal culture) and checked for bacteria.  Have a mucus sample examined to see if your sinusitis is related to an allergy. If your sinusitis does not respond to treatment and it lasts longer than 8 weeks, you may have an MRI or CT scan to check your sinuses. These scans also help to determine how severe your infection is. In rare cases, a bone biopsy may  be done to rule out more serious types of fungal sinus disease. How is this treated? Treatment for sinusitis depends on the cause and whether your condition is chronic or acute. If a virus is causing your sinusitis, your symptoms will go away on their own within 10 days. You may be given medicines to relieve your symptoms, including:  Topical nasal decongestants. They shrink swollen nasal passages and let mucus drain from your sinuses.  Antihistamines. These drugs block inflammation that is triggered by allergies. This can help to ease swelling in your nose and sinuses.  Topical nasal corticosteroids. These are nasal sprays that ease inflammation and swelling in your nose and sinuses.  Nasal saline washes. These rinses can help to get rid of thick mucus in your nose. If your condition is caused by bacteria, you will be given an antibiotic medicine. If your condition is caused by a fungus, you will be given an antifungal medicine. Surgery may be needed to correct underlying conditions, such as narrow nasal passages. Surgery may also be needed to remove polyps. Follow these instructions at home: Medicines   Take, use, or apply over-the-counter and prescription medicines only as told by your health care provider. These may include nasal sprays.  If you were prescribed an antibiotic medicine, take it as told by your health care provider. Do not stop taking the antibiotic even if you start to feel better. Hydrate and Humidify   Drink enough water to keep your urine clear or pale yellow. Staying hydrated will help to thin your mucus.  Use a cool mist humidifier to keep the humidity level in your home above 50%.  Inhale steam for 10-15 minutes, 3-4 times a day or as told by your health care provider. You can do this in the bathroom while a hot shower is running.  Limit your exposure to cool or dry air. Rest   Rest as much as possible.  Sleep with your head raised (elevated).  Make sure to  get enough sleep each night. General instructions   Apply a warm, moist washcloth to your face 3-4 times a day or as told by your health care provider. This will help with discomfort.  Wash your hands often with soap and water to reduce your exposure to viruses and other germs. If soap and water are not available, use hand sanitizer.  Do not smoke. Avoid being around people who are smoking (secondhand smoke).  Keep all follow-up visits as told by your health care provider. This is important. Contact a health care provider if:  You have a fever.  Your symptoms get worse.  Your symptoms do not improve within 10 days. Get help right away if:  You have a severe headache.  You have persistent vomiting.  You have pain or swelling around your face or eyes.  You have vision problems.  You develop confusion.  Your neck is stiff.  You have trouble breathing. This information is not intended to replace advice given to you by your health care provider. Make sure you discuss any questions you have with   your health care provider. Document Released: 05/29/2005 Document Revised: 01/23/2016 Document Reviewed: 03/24/2015 Elsevier Interactive Patient Education  2017 [employer]  

## 2016-10-23 NOTE — Telephone Encounter (Signed)
Pt called & said that methoprexade 2 weeks ago & she says that she had a slight cough & sinus infection & she just wants to know if she needs to follow up with Dr. Brigitte Pulse or with the doctor she was referred to because she feels so bad. She says she has been feeling bad all weekend & she doesn't know what to do. She doesn't know if the meds are affecting her. I couldn't really understand her over the phone I tried to make up what she said but she was crying & speaking so fast. I got all the info I could.   Please Advise

## 2016-10-23 NOTE — Telephone Encounter (Signed)
Feeling really sick, fighting uri and gi upset. I suggested an ov to eval, tx up front

## 2016-10-23 NOTE — Progress Notes (Signed)
Subjective:    Patient ID: Valerie [name], female    DOB: Nov 10, 1960, 56 y.o.   MRN: [id number]  HPI  Valerie [name] is a 56 year old woman here for an acute visit today due to illness.  Patient started on methotrexate 2 weeks ago. She is now developed a cough and sinus infection and has been feeling poorly for the past several days. Her stomach is still upset as well.  For the past 3-4 weeks she has had some continuing frontal sinus pressure pain - on zyrtec (alternates with allegra but not using this currently), singulair, flonase, eye gtts. Has not needed inhaler. Tried some sudafed so she could use her cpap. 2 wks ago when she started the methotrexate she began feeling fatigued. She really feels poorly. She is having night sweats. [facility]. She is having more gastritis/GERD than normal so using the carafate suspension which helps. She is also noticing more extremity muscle weakness - walking, lifting groceries.  2d ago she had pain and weakness even getting out of bed in the morning. SHe is getting some rhinitis. Ears fine, throat irritated and clearing throat.  Minimal cough. She is so fatigued she is having difficulty getting out of bed - she feels much worse than she has in a long time. Her appetite is decreased though she is making herself eat. No n/v, bowels normal though yesterday with abd cramping though no bowel changes, no melena or hematochemia. Some slight right flank pain today. Pushing fluid but notes some bladder discomfort, mild urgency, mild hesitancy.  The frontal headache is c/w past sinus infections.  Taking zyrtec --------  She has been dealing with an unknown autoimmune issue for the past 2 years that had worsened recently but her hospitalization for acute colitis one month prior finally provided enough information for her rheumatologist Dr. Graylin Shiver at [facility] to make a diagnosis of microscopic polyangiitis. Initially  patient knew she had an unknown diagnosis when she developed an acute episode of nephritis with renal failure July 2017 requiring hospitalization. Since then she had been relatively controlled on low-dose prolonged daily prednisone and though it was suspected that she had an autoimmune disease the diagnosis eluded her physicians. However as her symptoms were significantly improved she weaned off the prednisone which was quickly followed by a severe case of ischemic colitis for which she was hospitalized last month and treated with a course of Flagyl and Cipro. Rather than restart on prednisone,  Valerie [name] elected to start on methotrexate as soon as her LFTs were down to normal which they were at our visit 2 weeks prior. (LFT elevation was presumably due to Flagyl.)  Her next dose of methotrexate is due to be taken tomorrow. Her next appt with rheum is 6/14.  Has appt with GI to f/u the acute colitis is 5/16.   -------  History of ischemic colitis: Followed by Dr. Amedeo Plenty. Recurrent pancreatitis: Last flare was July 2017 and very mild. Creon 3 times a day prn but hasn't needed in a long time, prn promethazine. Lipase has remained at high normal ranging from 50-69 with normal high of 51 over the past 5 years and was actually at its lowest at 33 during patient's hospitalization. GERD with severe gastroparesis: Follow[facility] by [facility]. treated with annual Botox injections as couldn't tolerate reglan - last done November 2017. PPI Dexilant 60 twice a day, carafate suspension qid prn - has only  Needed once in the past week, promethazine when necessary.  IBS: Recurrent UTIs and interstitial cystitis: Chronic kidney disease stage III: Follows with Dr. Pearson Grippe at [facility].  OSA on CPAP: through Dr. Brett Fairy at [facility]. Hypertension: toprol 100, benazepril 40 mg, spironolactone 100mg . checks her BP at home and usually runs 110-112/68-70. Morbid obesity: Prediabetes: hgba1c 6.4 -> 6.2 Diffuse uric acid joint  flairs: She has never had gout but she states all of her joints hurt when her uric acid level is up so prefers to stay on allopurinol 150 mg daily. Chronic environmental allergic rhinitis and allergic conjunctivitis: Allegra versus Zyrtec, fluticasone, singulair, pazeo eye gtts qd (failed pataday), proair prn.  Anemia: Fibromyalgia: Was recently seen again at [place] [facility] for PT for chronic recurrent right sided neck pain triggering muscle tension headaches  Chronic back pain syndrome: Followed at pain management with records sent and scanned into media tab. On MS Contin 30mg  tid, hydrocodone 10mg  Anxiety/panic: Alprazolam 1 mg written for 3 times a day prn but will usually go for several weeks in between needing to take an alprazolam. However, during times of stress, use will increase up to 4-5x/wk. Valerie [name] skips her morphine if she uses the alprazolam. PTSD: Chronic fatigue: Provigil 100mg  bid from Dr. Brett Fairy with who pt has an appointment with in sev d. Psoriasis: prev controlled on lotrisone prn but breakthrough sxs so stepped up to betamethasone (Diprolene-AF) at last visit.  Past Medical History:  Diagnosis Date  . Acute ischemic colitis ([place])   . Anemia 05/01/2012  . Anxiety   . ARF (acute renal failure) ([place]) 12/28/2015  . Arthritis    "basically ~ q joint" (12/28/2015)  . Asthma   . Basal cell carcinoma of upper back ~ 2011  . Chronic lower back pain   . DDD (degenerative disc disease), lumbar   . Diarrhea    EPISODIC  . Fibromyalgia   . First degree heart block   . Gastro-esophageal reflux   . Headache(784.0)    "muscular w/migraine intensity; ~ 5-6 months" (12/28/2015)  . Hypertension   . Mental disorder   . Nerve pain    nerve damage to right arm  . Neuromuscular disorder ([place])   . Pancreatitis   . Pneumonia ~ 2014; 08/2015  . Polymyalgia rheumatica ([place]) onset 08/2015  . Precancerous melanosis    "2nd toe left foot; 5th toe of right foot; right temple;  had them all cut off"  . Sleep apnea    CPAP-9   Past Surgical History:  Procedure Laterality Date  . BASAL CELL CARCINOMA EXCISION  ~ 2011   back  . BOTOX INJECTION N/A 11/06/2012   Procedure: BOTOX INJECTION;  Surgeon: Missy Sabins, MD;  Location: [facility];  Service: Endoscopy;  Laterality: N/A;  . BOTOX INJECTION N/A 12/04/2013   Procedure: BOTOX INJECTION;  Surgeon: Missy Sabins, MD;  Location: [facility];  Service: Endoscopy;  Laterality: N/A;  . BOTOX INJECTION N/A 02/26/2014   Procedure: BOTOX INJECTION;  Surgeon: Missy Sabins, MD;  Location: [facility];  Service: Endoscopy;  Laterality: N/A;  . BOTOX INJECTION N/A 07/08/2015   Procedure: BOTOX INJECTION;  Surgeon: Teena Irani, MD;  Location: [facility];  Service: Endoscopy;  Laterality: N/A;  . BOTOX INJECTION N/A 04/27/2016   Procedure: BOTOX INJECTION;  Surgeon: Teena Irani, MD;  Location: [facility];  Service: Endoscopy;  Laterality: N/A;  . ESOPHAGOGASTRODUODENOSCOPY  12/21/2011   Procedure: ESOPHAGOGASTRODUODENOSCOPY (EGD);  Surgeon: Missy Sabins, MD;  Location: [facility];  Service: Endoscopy;  Laterality: N/A;  . ESOPHAGOGASTRODUODENOSCOPY N/A 11/06/2012   Procedure: ESOPHAGOGASTRODUODENOSCOPY (EGD);  Surgeon: Missy Sabins, MD;  Location: [facility];  Service: Endoscopy;  Laterality: N/A;  . ESOPHAGOGASTRODUODENOSCOPY N/A 12/04/2013   Procedure: ESOPHAGOGASTRODUODENOSCOPY (EGD);  Surgeon: Missy Sabins, MD;  Location: [facility] ENDOSCOPY;  Service: Endoscopy;  Laterality: N/A;  . ESOPHAGOGASTRODUODENOSCOPY N/A 02/26/2014   Procedure: ESOPHAGOGASTRODUODENOSCOPY (EGD);  Surgeon: Missy Sabins, MD;  Location: [facility] ENDOSCOPY;  Service: Endoscopy;  Laterality: N/A;  . ESOPHAGOGASTRODUODENOSCOPY (EGD) WITH PROPOFOL N/A 07/08/2015   Procedure: ESOPHAGOGASTRODUODENOSCOPY (EGD) WITH PROPOFOL;  Surgeon: Teena Irani, MD;  Location: [facility];  Service: Endoscopy;  Laterality: N/A;  . ESOPHAGOGASTRODUODENOSCOPY (EGD) WITH PROPOFOL N/A  04/27/2016   Procedure: ESOPHAGOGASTRODUODENOSCOPY (EGD) WITH PROPOFOL;  Surgeon: Teena Irani, MD;  Location: [facility];  Service: Endoscopy;  Laterality: N/A;  . FLEXIBLE SIGMOIDOSCOPY  05/01/2012   Procedure: FLEXIBLE SIGMOIDOSCOPY;  Surgeon: Jeryl Columbia, MD;  Location: [facility];  Service: Endoscopy;  Laterality: N/A;  unprepped  . FLEXIBLE SIGMOIDOSCOPY Left 09/28/2016   Procedure: FLEXIBLE SIGMOIDOSCOPY;  Surgeon: Wilford Corner, MD;  Location: [facility];  Service: Endoscopy;  Laterality: Left;  . LAPAROSCOPIC CHOLECYSTECTOMY  01/2002  . NASAL SEPTOPLASTY W/ TURBINOPLASTY  1998  . SHOULDER ARTHROSCOPY WITH ROTATOR CUFF REPAIR AND OPEN BICEPS TENODESIS Bilateral 2010-2013   left-right   Current Outpatient Prescriptions on File Prior to Visit  Medication Sig Dispense Refill  . allopurinol (ZYLOPRIM) 300 MG tablet Take 0.5 tablets (150 mg total) by mouth daily. 90 tablet 1  . ALPRAZolam (XANAX) 1 MG tablet Take 1 tablet (1 mg total) by mouth 3 (three) times daily as needed for anxiety. 90 tablet 1  . augmented betamethasone dipropionate (DIPROLENE-AF) 0.05 % cream Apply topically 2 (two) times daily. (Patient taking differently: Apply 1 application topically 2 (two) times daily as needed (palm). ) 50 g 3  . benazepril (LOTENSIN) 40 MG tablet Take 1 tablet (40 mg total) by mouth daily. 90 tablet 3  . Biotin 5000 MCG CAPS Take 10,000 mcg by mouth daily.     . calcium carbonate (OSCAL) 1500 (600 Ca) MG TABS tablet Take 600 mg of elemental calcium by mouth daily with breakfast.    . cetirizine (ZYRTEC) 10 MG tablet Take 10 mg by mouth daily. Alternates between Allegra and Zyrtec as needed for allergies    . dexlansoprazole (DEXILANT) 60 MG capsule Take 60 mg by mouth 2 (two) times daily.    . fexofenadine (ALLEGRA) 180 MG tablet Take 180 mg by mouth daily as needed. Switches between Allegra and Zyrtec as needed for allergies    . fluticasone (FLONASE) 50 MCG/ACT nasal spray Place 2  sprays into both nostrils daily. 48 g 2  . folic acid (FOLVITE) 1 MG tablet Take 1 mg by mouth daily.    Marland Kitchen HYDROcodone-acetaminophen (NORCO) 10-325 MG per tablet Take 1-2 tablets by mouth every 6 (six) hours as needed for moderate pain (pain.).     Marland Kitchen methotrexate (RHEUMATREX) 2.5 MG tablet Take 10 mg by mouth once a week.    . metoprolol succinate (TOPROL-XL) 100 MG 24 hr tablet Take 1 tablet (100 mg total) by mouth daily. Take with or immediately following a meal. 90 tablet 3  . modafinil (PROVIGIL) 200 MG tablet Take 0.5 tablets (100 mg total) by mouth 2 (two) times daily. 90 tablet 1  . montelukast (SINGULAIR) 10 MG tablet TAKE 1 TABLET (10 MG TOTAL) BY MOUTH AT BEDTIME 90  tablet 3  . morphine (MS CONTIN) 30 MG 12 hr tablet Take 30 mg by mouth every 8 (eight) hours. Not from [place]    . Multiple Vitamins-Minerals (MULTIVITAMIN PO) Take 1 tablet by mouth daily.    . Olopatadine HCl (PAZEO) 0.7 % SOLN Apply 1 drop to eye daily. 2.5 mL 11  . Pancrelipase, Lip-Prot-Amyl, (CREON) 24000 UNITS CPEP Take 72,000 Units by mouth 3 (three) times daily as needed (for pancreatitis).     Marland Kitchen PROAIR HFA 108 (90 Base) MCG/ACT inhaler INHALE 2 PUFFS INTO THE LUNGS AS NEEDED FOR WHEEZING OR SHORTNESS OF BREATH. 8.5 each 0  . promethazine (PHENERGAN) 25 MG tablet Take 1 tablet (25 mg total) by mouth every 6 (six) hours as needed for nausea or vomiting. 30 tablet 1  . spironolactone (ALDACTONE) 100 MG tablet Take 1 tablet (100 mg total) by mouth daily. 90 tablet 3  . sucralfate (CARAFATE) 1 GM/10ML suspension Take 10 mLs (1 g total) by mouth 4 (four) times daily as needed (stomach pain). 420 mL 1   No current facility-administered medications on file prior to visit.    Allergies  Allergen Reactions  . Dilaudid [Hydromorphone Hcl] Other (See Comments)    Headache   . Pregabalin Other (See Comments)    Drug induced stupor  . Metoclopramide Hcl Other (See Comments)    "crawling inside"  . Neurontin [Gabapentin]  Other (See Comments)    Tremors   . Other Other (See Comments)    Avoid all SSRI's and SNRI's- damaged pancreas   Family History  Problem Relation Age of Onset  . Heart disease Mother   . COPD Mother   . Arthritis Mother   . Cancer Mother   . Hypertension Mother   . Arthritis Paternal Grandmother   . Heart disease Paternal Grandmother   . Heart disease Paternal Grandfather    Social History   Social History  . Marital status: Single    Spouse name: N/A  . Number of children: 0  . Years of education: College   Occupational History  . disabled    Social History Main Topics  . Smoking status: Never Smoker  . Smokeless tobacco: Never Used  . Alcohol use No  . Drug use: No  . Sexual activity: Not Currently   Other Topics Concern  . None   Social History Narrative   Caffeine 2 cups iced tea daily avg.     Review of Systems See hpi    Objective:   Physical Exam  Constitutional: She is oriented to person, place, and time. She appears well-developed and well-nourished. She appears lethargic. She appears ill. No distress.  HENT:  Head: Normocephalic and atraumatic.  Right Ear: External ear and ear canal normal. Tympanic membrane is retracted. A middle ear effusion is present.  Left Ear: External ear and ear canal normal. Tympanic membrane is retracted. A middle ear effusion is present.  Nose: Mucosal edema and rhinorrhea present. Right sinus exhibits maxillary sinus tenderness. Left sinus exhibits maxillary sinus tenderness.  Mouth/Throat: Uvula is midline and mucous membranes are normal. Posterior oropharyngeal erythema present. No oropharyngeal exudate, posterior oropharyngeal edema or tonsillar abscesses.  Eyes: Conjunctivae are normal. Right eye exhibits no discharge. Left eye exhibits no discharge. No scleral icterus.  Neck: Normal range of motion. Neck supple.  Cardiovascular: Normal rate, regular rhythm, normal heart sounds and intact distal pulses.     Pulmonary/Chest: Effort normal and breath sounds normal.  Lymphadenopathy:  Head (right side): Submandibular adenopathy present. No preauricular and no posterior auricular adenopathy present.       Head (left side): Submandibular adenopathy present. No preauricular and no posterior auricular adenopathy present.    She has no cervical adenopathy.       Right: No supraclavicular adenopathy present.       Left: No supraclavicular adenopathy present.  Neurological: She is oriented to person, place, and time. She appears lethargic.  Skin: Skin is warm and dry. She is not diaphoretic. No erythema.  Psychiatric: She has a normal mood and affect. Her behavior is normal.      BP 112/76 (BP Location: Left Wrist, Patient Position: Sitting, Cuff Size: Large)   Pulse 73   Temp 98 F (36.7 C) (Oral)   Resp 16   Ht 5' 4.57" (1.64 m) Comment: patient refused to remove shoes  Wt 291 lb 12.8 oz (132.4 kg) Comment: patient refused to take shoes off  SpO2 96%   BMI 49.21 kg/m    Results for orders placed or performed in visit on 10/23/16  POCT CBC  Result Value Ref Range   WBC 7.5 4.6 - 10.2 K/uL   Lymph, poc 2.4 0.6 - 3.4   POC LYMPH PERCENT 31.4 10 - 50 %L   MID (cbc) 0.5 0 - 0.9   POC MID % 6.5 0 - 12 %M   POC Granulocyte 4.7 2 - 6.9   Granulocyte percent 62.1 37 - 80 %G   RBC 4.44 4.04 - 5.48 M/uL   Hemoglobin 13.5 12.2 - 16.2 g/dL   HCT, POC 40.1 37.7 - 47.9 %   MCV 90.3 80 - 97 fL   MCH, POC 30.4 27 - 31.2 pg   MCHC 33.6 31.8 - 35.4 g/dL   RDW, POC 14.2 %   Platelet Count, POC 310 142 - 424 K/uL   MPV 8.4 0 - 99.8 fL  POCT urinalysis dipstick  Result Value Ref Range   Color, UA yellow yellow   Clarity, UA clear clear   Glucose, UA negative negative mg/dL   Bilirubin, UA negative negative   Ketones, POC UA negative negative mg/dL   Spec Grav, UA <=1.005 (A) 1.010 - 1.025   Blood, UA negative negative   pH, UA 6.5 5.0 - 8.0   Protein Ur, POC negative negative mg/dL    Urobilinogen, UA 0.2 0.2 or 1.0 E.U./dL   Nitrite, UA Negative Negative   Leukocytes, UA Negative Negative     Assessment & Plan:   1. Fatigue, unspecified type   2. High risk medication use   3. Acute non-recurrent frontal sinusitis   Pt is immunosuppressed from recent long-term prednisone use and now recently starting methotrexate so will err on the side of caution and treat for sinus infxn. Fax labs to pt's rheum Dr. Graylin Shiver at [facility] - her next appt there is 6/14.  Cont zyrtec.  Orders Placed This Encounter  Procedures  . Comprehensive metabolic panel  . POCT CBC  . POCT SEDIMENTATION RATE  . POCT urinalysis dipstick  . POCT Microscopic Urinalysis ([facility])    Meds ordered this encounter  Medications  . amoxicillin-clavulanate (AUGMENTIN) 875-125 MG tablet    Sig: Take 1 tablet by mouth 2 (two) times daily.    Dispense:  20 tablet    Refill:  0  . Guaifenesin (MUCINEX MAXIMUM STRENGTH) 1200 MG TB12    Sig: Take 1 tablet (1,200 mg total) by mouth every 12 (twelve) hours as needed.  Dispense:  14 tablet    Refill:  1    Delman Cheadle, M.D.  Primary Care at [facility] [address] [place], [place] [address] [phone] phone [phone] fax  10/29/16 11:34 PM

## 2016-10-24 LAB — COMPREHENSIVE METABOLIC PANEL
A/G RATIO: 1.4 (ref 1.2–2.2)
ALT: 21 IU/L (ref 0–32)
AST: 15 IU/L (ref 0–40)
Albumin: 4.4 g/dL (ref 3.5–5.5)
Alkaline Phosphatase: 47 IU/L (ref 39–117)
BILIRUBIN TOTAL: 0.2 mg/dL (ref 0.0–1.2)
BUN / CREAT RATIO: 15 (ref 9–23)
BUN: 15 mg/dL (ref 6–24)
CO2: 23 mmol/L (ref 18–29)
CREATININE: 0.98 mg/dL (ref 0.57–1.00)
Calcium: 9.9 mg/dL (ref 8.7–10.2)
Chloride: 100 mmol/L (ref 96–106)
GFR calc non Af Amer: 66 mL/min/{1.73_m2} (ref >59)
GFR, EST AFRICAN AMERICAN: 76 mL/min/{1.73_m2} (ref >59)
GLUCOSE: 98 mg/dL (ref 65–99)
Globulin, Total: 3.1 g/dL (ref 1.5–4.5)
Potassium: 5.4 mmol/L — ABNORMAL HIGH (ref 3.5–5.2)
Sodium: 138 mmol/L (ref 134–144)
TOTAL PROTEIN: 7.5 g/dL (ref 6.0–8.5)

## 2016-10-25 DIAGNOSIS — K3184 Gastroparesis: Secondary | ICD-10-CM | POA: Diagnosis not present

## 2016-10-25 DIAGNOSIS — K559 Vascular disorder of intestine, unspecified: Secondary | ICD-10-CM | POA: Diagnosis not present

## 2016-10-25 DIAGNOSIS — R112 Nausea with vomiting, unspecified: Secondary | ICD-10-CM | POA: Diagnosis not present

## 2016-10-27 ENCOUNTER — Encounter: Payer: Self-pay | Admitting: Radiology

## 2016-11-02 ENCOUNTER — Telehealth: Payer: Self-pay | Admitting: Family Medicine

## 2016-11-02 NOTE — Telephone Encounter (Signed)
Pt is having vomitting and diaherra and is at [place] with her mother and is not sure what needs to be done   Best number [phone]

## 2016-11-02 NOTE — Telephone Encounter (Signed)
Adv pt her phone message was put in and the time frame for a call back is 48-72 hours.

## 2016-11-02 NOTE — Telephone Encounter (Signed)
lmtcb or use local u/c Stay hydrated  1 oz clear q 15 min

## 2016-11-04 ENCOUNTER — Ambulatory Visit (INDEPENDENT_AMBULATORY_CARE_PROVIDER_SITE_OTHER): Payer: Medicare Other

## 2016-11-04 ENCOUNTER — Ambulatory Visit (INDEPENDENT_AMBULATORY_CARE_PROVIDER_SITE_OTHER): Payer: Medicare Other | Admitting: Physician Assistant

## 2016-11-04 ENCOUNTER — Encounter: Payer: Self-pay | Admitting: Physician Assistant

## 2016-11-04 VITALS — Temp 98.3°F | Ht 64.5 in | Wt 287.0 lb

## 2016-11-04 DIAGNOSIS — R112 Nausea with vomiting, unspecified: Secondary | ICD-10-CM

## 2016-11-04 DIAGNOSIS — F4321 Adjustment disorder with depressed mood: Secondary | ICD-10-CM

## 2016-11-04 DIAGNOSIS — R05 Cough: Secondary | ICD-10-CM | POA: Diagnosis not present

## 2016-11-04 DIAGNOSIS — R197 Diarrhea, unspecified: Secondary | ICD-10-CM

## 2016-11-04 DIAGNOSIS — R059 Cough, unspecified: Secondary | ICD-10-CM

## 2016-11-04 DIAGNOSIS — F4320 Adjustment disorder, unspecified: Secondary | ICD-10-CM | POA: Diagnosis not present

## 2016-11-04 LAB — POCT CBC
Granulocyte percent: 71.6 %G (ref 37–80)
HEMATOCRIT: 37.3 % — AB (ref 37.7–47.9)
Hemoglobin: 12.9 g/dL (ref 12.2–16.2)
Lymph, poc: 2.2 (ref 0.6–3.4)
MCH, POC: 31.1 pg (ref 27–31.2)
MCHC: 34.5 g/dL (ref 31.8–35.4)
MCV: 90 fL (ref 80–97)
MID (cbc): 0.5 (ref 0–0.9)
MPV: 7.9 fL (ref 0–99.8)
POC GRANULOCYTE: 6.9 (ref 2–6.9)
POC LYMPH %: 23.2 % (ref 10–50)
POC MID %: 5.2 %M (ref 0–12)
Platelet Count, POC: 303 10*3/uL (ref 142–424)
RBC: 4.14 M/uL (ref 4.04–5.48)
RDW, POC: 13.8 %
WBC: 9.6 10*3/uL (ref 4.6–10.2)

## 2016-11-04 LAB — POCT URINALYSIS DIP (MANUAL ENTRY)
BILIRUBIN UA: NEGATIVE
Glucose, UA: NEGATIVE mg/dL
Ketones, POC UA: NEGATIVE mg/dL
LEUKOCYTES UA: NEGATIVE
NITRITE UA: NEGATIVE
PH UA: 5 (ref 5.0–8.0)
PROTEIN UA: NEGATIVE mg/dL
RBC UA: NEGATIVE
Spec Grav, UA: <=1.005 — AB (ref 1.010–1.025)
Urobilinogen, UA: 0.2 E.U./dL

## 2016-11-04 MED ORDER — HYDROXYZINE HCL 10 MG PO TABS: 10.0000 mg | ORAL_TABLET | Freq: Three times a day (TID) | ORAL | 0 refills | Status: DC | PRN

## 2016-11-04 MED ORDER — ONDANSETRON HCL 4 MG PO TABS: 4.0000 mg | ORAL_TABLET | Freq: Three times a day (TID) | ORAL | 0 refills | Status: DC | PRN

## 2016-11-04 NOTE — Patient Instructions (Signed)
     IF you received an x-ray today, you will receive an invoice from [facility]. Please contact [facility] at [phone] with questions or concerns regarding your invoice.   IF you received labwork today, you will receive an invoice from [facility]. Please contact [facility] at [phone] with questions or concerns regarding your invoice.   Our billing staff will not be able to assist you with questions regarding bills from these companies.  You will be contacted with the lab results as soon as they are available. The fastest way to get your results is to activate your My Chart account. Instructions are located on the last page of this paperwork. If you have not heard from us regarding the results in 2 weeks, please contact this office.     

## 2016-11-04 NOTE — Progress Notes (Signed)
11/04/2016 12:12 PM   DOB: [birth year] / MRN: [id number]  SUBJECTIVE:  Valerie [name] is a 56 y.o. female presenting for cough and chest congestion that started about 4 days ago.  Valerie [name] does have a history of asthma. Valerie [name] denies SOB and DOE.  Valerie [name] is not coughing up any blood.  Valerie [name] has a history of aspiration pneumonia.  Valerie [name] denies changes in nasal congestion, sore throat, or other ENT symptoms. Valerie [name] recently finished Augmentin.   Valerie [name] also complains of nausea, diarrhea, and emesis.  This started about 3 days ago. Denies any blood in emesis and diarrhea. Valerie [name] has been pushing fluids.  Had one episode of GERD last night but this resolved. Valerie [name] has a history of ischemic colitis.   Valerie [name] lost her mother unexpectedly 2/2 to cancer over the last week.  The can was diagnosed roughly the week before.  Valerie [name] is tearful as Valerie [name] relays this to me.  Valerie [name] has tried Xanax and this has helped her stay somewhat calm.   Valerie [name] is not sure if her symptoms are 2/2 a physical ailment or the product of stress.   Valerie [name] is allergic to dilaudid [hydromorphone hcl]; pregabalin; metoclopramide hcl; neurontin [gabapentin]; and other.   Valerie [name]  has a past medical history of Acute ischemic colitis ([place]); Anemia (05/01/2012); Anxiety; ARF (acute renal failure) ([place]) (12/28/2015); Arthritis; Asthma; Basal cell carcinoma of upper back (~ 2011); Chronic lower back pain; DDD (degenerative disc disease), lumbar; Diarrhea; Fibromyalgia; First degree heart block; Gastro-esophageal reflux; Headache(784.0); Hypertension; Mental disorder; Nerve pain; Neuromuscular disorder ([place]); Pancreatitis; Pneumonia (~ 2014; 08/2015); Polymyalgia rheumatica ([place]) (onset 08/2015); Precancerous melanosis; and Sleep apnea.    Valerie [name]  reports that Valerie [name] has never smoked. Valerie [name] has never used smokeless tobacco. Valerie [name] reports that Valerie [name] does not drink alcohol or use drugs. Valerie [name]  reports that Valerie [name] does not currently engage in sexual activity. The patient  has a past surgical history that  includes Shoulder arthroscopy with rotator cuff repair and open biceps tenodesis (Bilateral, 2010-2013); Nasal septoplasty w/ turbinoplasty (1998); Esophagogastroduodenoscopy (12/21/2011); Flexible sigmoidoscopy (05/01/2012); Excision basal cell carcinoma (~ 2011); Esophagogastroduodenoscopy (N/A, 11/06/2012); Botox injection (N/A, 11/06/2012); Esophagogastroduodenoscopy (N/A, 12/04/2013); Botox injection (N/A, 12/04/2013); Esophagogastroduodenoscopy (N/A, 02/26/2014); Botox injection (N/A, 02/26/2014); Esophagogastroduodenoscopy (egd) with propofol (N/A, 07/08/2015); Botox injection (N/A, 07/08/2015); Laparoscopic cholecystectomy (01/2002); Esophagogastroduodenoscopy (egd) with propofol (N/A, 04/27/2016); Botox injection (N/A, 04/27/2016); and Flexible sigmoidoscopy (Left, 09/28/2016).  Her family history includes Arthritis in her mother and paternal grandmother; COPD in her mother; Cancer in her mother; Heart disease in her mother, paternal grandfather, and paternal grandmother; Hypertension in her mother.  Review of Systems  Constitutional: Negative for chills, diaphoresis and fever.  Respiratory: Positive for cough. Negative for hemoptysis, sputum production, shortness of breath and wheezing.   Cardiovascular: Negative for chest pain, orthopnea and leg swelling.  Gastrointestinal: Positive for abdominal pain, diarrhea and vomiting. Negative for blood in stool, constipation, melena and nausea.  Genitourinary: Negative for dysuria, frequency and urgency.  Skin: Negative for rash.  Neurological: Negative for dizziness and headaches.    The problem list and medications were reviewed and updated by myself where necessary and exist elsewhere in the encounter.   OBJECTIVE:  Temp 98.3 F (36.8 C) (Oral)   Ht 5' 4.5" (1.638 m)   Wt 287 lb (130.2 kg)   SpO2 98%   BMI 48.50 kg/m   Physical Exam  Constitutional: Valerie [name] is oriented to person, place, and time.  Cardiovascular: Normal rate, regular rhythm, normal  heart sounds and intact  distal pulses.  Exam reveals no gallop and no friction rub.   No murmur heard. Pulmonary/Chest: Effort normal and breath sounds normal. No respiratory distress. Valerie [name] has no wheezes. Valerie [name] has no rales. Valerie [name] exhibits no tenderness.  Abdominal: Soft. Bowel sounds are normal. Valerie [name] exhibits no distension and no mass. There is tenderness (generalized). There is no rebound and no guarding.  Musculoskeletal: Normal range of motion.  Neurological: Valerie [name] is alert and oriented to person, place, and time.  Skin: Skin is warm and dry.  Psychiatric:  Tearful and anxious as Valerie [name] relates the passing of her mother.    Results for orders placed or performed in visit on 11/04/16 (from the past 72 hour(s))  POCT CBC     Status: Abnormal   Collection Time: 11/04/16 11:50 AM  Result Value Ref Range   WBC 9.6 4.6 - 10.2 K/uL   Lymph, poc 2.2 0.6 - 3.4   POC LYMPH PERCENT 23.2 10 - 50 %L   MID (cbc) 0.5 0 - 0.9   POC MID % 5.2 0 - 12 %M   POC Granulocyte 6.9 2 - 6.9   Granulocyte percent 71.6 37 - 80 %G   RBC 4.14 4.04 - 5.48 M/uL   Hemoglobin 12.9 12.2 - 16.2 g/dL   HCT, POC 37.3 (A) 37.7 - 47.9 %   MCV 90.0 80 - 97 fL   MCH, POC 31.1 27 - 31.2 pg   MCHC 34.5 31.8 - 35.4 g/dL   RDW, POC 13.8 %   Platelet Count, POC 303 142 - 424 K/uL   MPV 7.9 0 - 99.8 fL  POCT urinalysis dipstick     Status: Abnormal   Collection Time: 11/04/16 11:52 AM  Result Value Ref Range   Color, UA yellow yellow   Clarity, UA clear clear   Glucose, UA negative negative mg/dL   Bilirubin, UA negative negative   Ketones, POC UA negative negative mg/dL   Spec Grav, UA <=1.005 (A) 1.010 - 1.025   Blood, UA negative negative   pH, UA 5.0 5.0 - 8.0   Protein Ur, POC negative negative mg/dL   Urobilinogen, UA 0.2 0.2 or 1.0 E.U./dL   Nitrite, UA Negative Negative   Leukocytes, UA Negative Negative   Orthostatic VS for the past 24 hrs:  BP- Lying Pulse- Lying BP- Sitting Pulse- Sitting BP- Standing at 0  minutes Pulse- Standing at 0 minutes  11/04/16 1130 133/78 73 127/85 71 (!) 130/94 82     Dg Chest 2 View  Result Date: 11/04/2016 CLINICAL DATA:  Cough. EXAM: CHEST  2 VIEW COMPARISON:  Radiographs of August 31, 2015. FINDINGS: The heart size and mediastinal contours are within normal limits. Both lungs are clear. No pneumothorax or pleural effusion is noted. The visualized skeletal structures are unremarkable. IMPRESSION: No active cardiopulmonary disease. Electronically Signed   By: Marijo Conception, M.D.   On: 11/04/2016 11:58    ASSESSMENT AND PLAN:  Valerie [name] was seen today for cough, diarrhea and emesis.  Diagnoses and all orders for this visit:  Cough: Her work up is normal.  Valerie [name] has recently finished Augmentin and given diarrhea will check a stool pathogen panel. Will treat her symptoms symptomatically for now.  -     POCT CBC -     DG Chest 2 View; Future  Grief -     hydrOXYzine (ATARAX/VISTARIL) 10 MG tablet; Take 1-3 tablets (10-30 mg total) by mouth 3 (three) times daily as needed.  Diarrhea of  presumed infectious origin -     POCT urinalysis dipstick -     Orthostatic vital signs -     GI Profile, Stool, PCR -     CMP14+EGFR  Nausea and vomiting, intractability of vomiting not specified, unspecified vomiting type -     ondansetron (ZOFRAN) 4 MG tablet; Take 1 tablet (4 mg total) by mouth every 8 (eight) hours as needed for nausea or vomiting.    The patient is advised to call or return to clinic if Valerie [name] does not see an improvement in symptoms, or to seek the care of the closest emergency department if Valerie [name] worsens with the above plan.   Philis Fendt, MHS, PA-C [facility] 11/04/2016 12:12 PM

## 2016-11-04 NOTE — Addendum Note (Signed)
Addended by: Leeanne Rio on: 11/04/2016 12:57 PM   Modules accepted: Orders

## 2016-11-05 LAB — CMP14+EGFR
ALBUMIN: 4.5 g/dL (ref 3.5–5.5)
ALT: 21 IU/L (ref 0–32)
AST: 17 IU/L (ref 0–40)
Albumin/Globulin Ratio: 1.5 (ref 1.2–2.2)
Alkaline Phosphatase: 45 IU/L (ref 39–117)
BILIRUBIN TOTAL: 0.2 mg/dL (ref 0.0–1.2)
BUN / CREAT RATIO: 16 (ref 9–23)
BUN: 23 mg/dL (ref 6–24)
CHLORIDE: 105 mmol/L (ref 96–106)
CO2: 17 mmol/L — ABNORMAL LOW (ref 18–29)
Calcium: 10.3 mg/dL — ABNORMAL HIGH (ref 8.7–10.2)
Creatinine, Ser: 1.45 mg/dL — ABNORMAL HIGH (ref 0.57–1.00)
GFR calc Af Amer: 47 mL/min/{1.73_m2} — ABNORMAL LOW (ref >59)
GFR calc non Af Amer: 41 mL/min/{1.73_m2} — ABNORMAL LOW (ref >59)
GLOBULIN, TOTAL: 3.1 g/dL (ref 1.5–4.5)
GLUCOSE: 134 mg/dL — AB (ref 65–99)
Potassium: 5 mmol/L (ref 3.5–5.2)
SODIUM: 141 mmol/L (ref 134–144)
Total Protein: 7.6 g/dL (ref 6.0–8.5)

## 2016-11-05 NOTE — Telephone Encounter (Signed)
Excellent advise - please see how pt is doing?  I am happy to send in some anti-emetics for her if needed.

## 2016-11-07 ENCOUNTER — Encounter: Payer: Self-pay | Admitting: Physician Assistant

## 2016-11-07 ENCOUNTER — Ambulatory Visit (INDEPENDENT_AMBULATORY_CARE_PROVIDER_SITE_OTHER): Payer: Medicare Other | Admitting: Physician Assistant

## 2016-11-07 VITALS — BP 116/81 | HR 75 | Temp 98.9°F | Resp 18 | Ht 64.5 in | Wt 292.0 lb

## 2016-11-07 DIAGNOSIS — R7989 Other specified abnormal findings of blood chemistry: Secondary | ICD-10-CM | POA: Diagnosis not present

## 2016-11-07 LAB — POCT URINALYSIS DIP (MANUAL ENTRY)
BILIRUBIN UA: NEGATIVE mg/dL
Bilirubin, UA: NEGATIVE
Blood, UA: NEGATIVE
GLUCOSE UA: NEGATIVE mg/dL
Leukocytes, UA: NEGATIVE
Nitrite, UA: NEGATIVE
Protein Ur, POC: NEGATIVE mg/dL
SPEC GRAV UA: 1.01 (ref 1.010–1.025)
Urobilinogen, UA: 0.2 E.U./dL
pH, UA: 5.5 (ref 5.0–8.0)

## 2016-11-07 LAB — POCT CBC
Granulocyte percent: 66.4 %G (ref 37–80)
HCT, POC: 35.8 % — AB (ref 37.7–47.9)
HEMOGLOBIN: 12.6 g/dL (ref 12.2–16.2)
Lymph, poc: 2.3 (ref 0.6–3.4)
MCH: 31.5 pg — AB (ref 27–31.2)
MCHC: 35.2 g/dL (ref 31.8–35.4)
MCV: 89.5 fL (ref 80–97)
MID (cbc): 0.6 (ref 0–0.9)
MPV: 7.9 fL (ref 0–99.8)
PLATELET COUNT, POC: 302 10*3/uL (ref 142–424)
POC Granulocyte: 5.6 (ref 2–6.9)
POC LYMPH PERCENT: 26.5 %L (ref 10–50)
POC MID %: 7.1 %M (ref 0–12)
RBC: 4.01 M/uL — AB (ref 4.04–5.48)
RDW, POC: 13.8 %
WBC: 8.5 10*3/uL (ref 4.6–10.2)

## 2016-11-07 NOTE — Progress Notes (Signed)
11/07/2016 3:09 PM   DOB: 17-Jul-1960 / MRN: [id number]  SUBJECTIVE:  Valerie [name] is a 56 y.o. female presenting for recheck of elevated creatinine. I had seen her 3 days ago and we had determined that the most likely explanation of her cough, nausea, diarrhea and a few episodes of non bloody emesis., were likely stress related 2/2 her mother sudden and unexpected passing.  Tells me that all have resolved aside from the nausea.  She does take methotrexate and had taken her weekly PO dose the day before seeing me.    She has a CT and renal ultrasound show that her kidney are without mass.    She is allergic to dilaudid [hydromorphone hcl]; pregabalin; metoclopramide hcl; neurontin [gabapentin]; and other.   She  has a past medical history of Acute ischemic colitis ([place]); Anemia (05/01/2012); Anxiety; ARF (acute renal failure) ([place]) (12/28/2015); Arthritis; Asthma; Basal cell carcinoma of upper back (~ 2011); Chronic lower back pain; DDD (degenerative disc disease), lumbar; Diarrhea; Fibromyalgia; First degree heart block; Gastro-esophageal reflux; Headache(784.0); Hypertension; Mental disorder; Nerve pain; Neuromuscular disorder ([place]); Pancreatitis; Pneumonia (~ 2014; 08/2015); Polymyalgia rheumatica ([place]) (onset 08/2015); Precancerous melanosis; and Sleep apnea.    She  reports that she has never smoked. She has never used smokeless tobacco. She reports that she does not drink alcohol or use drugs. She  reports that she does not currently engage in sexual activity. The patient  has a past surgical history that includes Shoulder arthroscopy with rotator cuff repair and open biceps tenodesis (Bilateral, 2010-2013); Nasal septoplasty w/ turbinoplasty (1998); Esophagogastroduodenoscopy (12/21/2011); Flexible sigmoidoscopy (05/01/2012); Excision basal cell carcinoma (~ 2011); Esophagogastroduodenoscopy (N/A, 11/06/2012); Botox injection (N/A, 11/06/2012); Esophagogastroduodenoscopy (N/A, 12/04/2013); Botox  injection (N/A, 12/04/2013); Esophagogastroduodenoscopy (N/A, 02/26/2014); Botox injection (N/A, 02/26/2014); Esophagogastroduodenoscopy (egd) with propofol (N/A, 07/08/2015); Botox injection (N/A, 07/08/2015); Laparoscopic cholecystectomy (01/2002); Esophagogastroduodenoscopy (egd) with propofol (N/A, 04/27/2016); Botox injection (N/A, 04/27/2016); and Flexible sigmoidoscopy (Left, 09/28/2016).  Her family history includes Arthritis in her mother and paternal grandmother; COPD in her mother; Cancer in her mother; Heart disease in her mother, paternal grandfather, and paternal grandmother; Hypertension in her mother.  Review of Systems  Constitutional: Negative for chills, diaphoresis and fever.  Respiratory: Negative for cough, hemoptysis, sputum production, shortness of breath and wheezing.   Cardiovascular: Negative for chest pain, orthopnea and leg swelling.  Gastrointestinal: Positive for constipation. Negative for abdominal pain, blood in stool, diarrhea, heartburn, melena, nausea and vomiting.  Genitourinary: Negative for flank pain.  Skin: Negative for rash.  Neurological: Negative for dizziness.    The problem list and medications were reviewed and updated by myself where necessary and exist elsewhere in the encounter.   OBJECTIVE:  BP 116/81   Pulse 75   Temp 98.9 F (37.2 C) (Oral)   Resp 18   Ht 5' 4.5" (1.638 m)   Wt 292 lb (132.5 kg) Comment: with shoes  SpO2 94%   BMI 49.35 kg/m   Physical Exam  Constitutional: She is active.  Non-toxic appearance.  Cardiovascular: Normal rate, regular rhythm, S1 normal, S2 normal, normal heart sounds and intact distal pulses.  Exam reveals no gallop, no friction rub and no decreased pulses.   No murmur heard. Pulmonary/Chest: Effort normal. No stridor. No tachypnea. No respiratory distress. She has no wheezes. She has no rales.  Abdominal: She exhibits no distension.  Neurological: She is alert.  Skin: Skin is warm and dry. She is not  diaphoretic. No pallor.    Results for orders placed  or performed in visit on 11/07/16 (from the past 72 hour(s))  POCT CBC     Status: Abnormal   Collection Time: 11/07/16  2:22 PM  Result Value Ref Range   WBC 8.5 4.6 - 10.2 K/uL   Lymph, poc 2.3 0.6 - 3.4   POC LYMPH PERCENT 26.5 10 - 50 %L   MID (cbc) 0.6 0 - 0.9   POC MID % 7.1 0 - 12 %M   POC Granulocyte 5.6 2 - 6.9   Granulocyte percent 66.4 37 - 80 %G   RBC 4.01 (A) 4.04 - 5.48 M/uL   Hemoglobin 12.6 12.2 - 16.2 g/dL   HCT, POC 35.8 (A) 37.7 - 47.9 %   MCV 89.5 80 - 97 fL   MCH, POC 31.5 (A) 27 - 31.2 pg   MCHC 35.2 31.8 - 35.4 g/dL   RDW, POC 13.8 %   Platelet Count, POC 302 142 - 424 K/uL   MPV 7.9 0 - 99.8 fL  POCT urinalysis dipstick     Status: None   Collection Time: 11/07/16  2:40 PM  Result Value Ref Range   Color, UA yellow yellow   Clarity, UA clear clear   Glucose, UA negative negative mg/dL   Bilirubin, UA negative negative   Ketones, POC UA negative negative mg/dL   Spec Grav, UA 1.010 1.010 - 1.025   Blood, UA negative negative   pH, UA 5.5 5.0 - 8.0   Protein Ur, POC negative negative mg/dL   Urobilinogen, UA 0.2 0.2 or 1.0 E.U./dL   Nitrite, UA Negative Negative   Leukocytes, UA Negative Negative   CBC Latest Ref Rng & Units 11/07/2016 11/04/2016 10/23/2016  WBC 4.6 - 10.2 K/uL 8.5 9.6 7.5  Hemoglobin 12.2 - 16.2 g/dL 12.6 12.9 13.5  Hematocrit 37.7 - 47.9 % 35.8(A) 37.3(A) 40.1  Platelets 150 - 379 x10E3/uL - - -   EKG: Within normal limits.   No results found.  ASSESSMENT AND PLAN:  Arwen was seen today for follow-up.  Diagnoses and all orders for this visit:  Elevated serum creatinine: See HPI.  Most likely 2/2 methotrexate administration.  I expect her CR to normalize if this is the case given her completely normal urine times two.  Given history of rheumatological disease sed rate and CR pending.  Will discuss those results, if elevated, with Dr. Brigitte Pulse or her rheumatologist,  particularly if CR remains elevated.  A scan is not out of the question as well.  -     POCT urinalysis dipstick -     POCT CBC -     CMP14+EGFR -     EKG 12-Lead -     CK -     Sedimentation Rate -     C-reactive protein    The patient is advised to call or return to clinic if she does not see an improvement in symptoms, or to seek the care of the closest emergency department if she worsens with the above plan.   Philis Fendt, MHS, PA-C [facility] 11/07/2016 3:09 PM

## 2016-11-07 NOTE — Telephone Encounter (Signed)
Pt is coming in today

## 2016-11-07 NOTE — Patient Instructions (Signed)
     IF you received an x-ray today, you will receive an invoice from [facility]. Please contact [facility] at [phone] with questions or concerns regarding your invoice.   IF you received labwork today, you will receive an invoice from [facility]. Please contact [facility] at [phone] with questions or concerns regarding your invoice.   Our billing staff will not be able to assist you with questions regarding bills from these companies.  You will be contacted with the lab results as soon as they are available. The fastest way to get your results is to activate your My Chart account. Instructions are located on the last page of this paperwork. If you have not heard from us regarding the results in 2 weeks, please contact this office.     

## 2016-11-08 ENCOUNTER — Telehealth: Payer: Self-pay | Admitting: Family Medicine

## 2016-11-08 ENCOUNTER — Encounter: Payer: Self-pay | Admitting: Physician Assistant

## 2016-11-08 LAB — CMP14+EGFR
ALK PHOS: 50 IU/L (ref 39–117)
ALT: 15 IU/L (ref 0–32)
AST: 17 IU/L (ref 0–40)
Albumin/Globulin Ratio: 1.5 (ref 1.2–2.2)
Albumin: 4.1 g/dL (ref 3.5–5.5)
BUN/Creatinine Ratio: 24 — ABNORMAL HIGH (ref 9–23)
BUN: 28 mg/dL — AB (ref 6–24)
Bilirubin Total: 0.2 mg/dL (ref 0.0–1.2)
CALCIUM: 9.4 mg/dL (ref 8.7–10.2)
CO2: 22 mmol/L (ref 18–29)
Chloride: 102 mmol/L (ref 96–106)
Creatinine, Ser: 1.15 mg/dL — ABNORMAL HIGH (ref 0.57–1.00)
GFR calc Af Amer: 62 mL/min/{1.73_m2} (ref >59)
GFR, EST NON AFRICAN AMERICAN: 54 mL/min/{1.73_m2} — AB (ref >59)
GLOBULIN, TOTAL: 2.8 g/dL (ref 1.5–4.5)
GLUCOSE: 163 mg/dL — AB (ref 65–99)
Potassium: 5.1 mmol/L (ref 3.5–5.2)
Sodium: 137 mmol/L (ref 134–144)
Total Protein: 6.9 g/dL (ref 6.0–8.5)

## 2016-11-08 LAB — SEDIMENTATION RATE: SED RATE: 43 mm/h — AB (ref 0–40)

## 2016-11-08 LAB — CK: CK TOTAL: 49 U/L (ref 24–173)

## 2016-11-08 LAB — C-REACTIVE PROTEIN: CRP: 15.9 mg/L — AB (ref 0.0–4.9)

## 2016-11-08 NOTE — Telephone Encounter (Signed)
Please release result note Ty

## 2016-11-08 NOTE — Telephone Encounter (Signed)
Valerie [name] PT CALLING ABOUT LABS WAKE FORREST NEED RESULTS AS SOON AS POSSIBLE

## 2016-11-09 ENCOUNTER — Encounter: Payer: Self-pay | Admitting: Physician Assistant

## 2016-11-10 NOTE — Addendum Note (Signed)
Addendum  created 11/10/16 1401 by Rica Koyanagi, MD   Sign clinical note

## 2016-11-15 ENCOUNTER — Encounter: Payer: Self-pay | Admitting: Physician Assistant

## 2016-11-17 ENCOUNTER — Encounter: Payer: Self-pay | Admitting: Physician Assistant

## 2016-11-17 ENCOUNTER — Ambulatory Visit (INDEPENDENT_AMBULATORY_CARE_PROVIDER_SITE_OTHER): Payer: Medicare Other | Admitting: Physician Assistant

## 2016-11-17 VITALS — BP 122/78 | HR 73 | Temp 98.6°F | Resp 18 | Ht 64.5 in | Wt 287.8 lb

## 2016-11-17 DIAGNOSIS — R7989 Other specified abnormal findings of blood chemistry: Secondary | ICD-10-CM

## 2016-11-17 DIAGNOSIS — K861 Other chronic pancreatitis: Secondary | ICD-10-CM

## 2016-11-17 DIAGNOSIS — R748 Abnormal levels of other serum enzymes: Secondary | ICD-10-CM | POA: Diagnosis not present

## 2016-11-17 DIAGNOSIS — R112 Nausea with vomiting, unspecified: Secondary | ICD-10-CM | POA: Diagnosis not present

## 2016-11-17 DIAGNOSIS — G43A0 Cyclical vomiting, not intractable: Secondary | ICD-10-CM

## 2016-11-17 DIAGNOSIS — R1115 Cyclical vomiting syndrome unrelated to migraine: Secondary | ICD-10-CM

## 2016-11-17 LAB — POCT URINALYSIS DIP (MANUAL ENTRY)
BILIRUBIN UA: NEGATIVE
BILIRUBIN UA: NEGATIVE mg/dL
Glucose, UA: NEGATIVE mg/dL
Leukocytes, UA: NEGATIVE
Nitrite, UA: NEGATIVE
PH UA: 5.5 (ref 5.0–8.0)
Protein Ur, POC: NEGATIVE mg/dL
RBC UA: NEGATIVE
SPEC GRAV UA: 1.02 (ref 1.010–1.025)
Urobilinogen, UA: 0.2 E.U./dL

## 2016-11-17 LAB — POCT CBC
GRANULOCYTE PERCENT: 73.1 % (ref 37–80)
HCT, POC: 36.9 % — AB (ref 37.7–47.9)
Hemoglobin: 12.7 g/dL (ref 12.2–16.2)
LYMPH, POC: 2.2 (ref 0.6–3.4)
MCH, POC: 30.8 pg (ref 27–31.2)
MCHC: 34.5 g/dL (ref 31.8–35.4)
MCV: 89.3 fL (ref 80–97)
MID (cbc): 0.5 (ref 0–0.9)
MPV: 8.3 fL (ref 0–99.8)
PLATELET COUNT, POC: 299 10*3/uL (ref 142–424)
POC Granulocyte: 7.2 — AB (ref 2–6.9)
POC LYMPH %: 22.3 % (ref 10–50)
POC MID %: 4.6 %M (ref 0–12)
RBC: 4.13 M/uL (ref 4.04–5.48)
RDW, POC: 14.6 %
WBC: 9.9 10*3/uL (ref 4.6–10.2)

## 2016-11-17 NOTE — Progress Notes (Signed)
11/18/2016 11:12 AM   DOB: Sep 29, 1960 / MRN: [id number]  SUBJECTIVE:    Valerie [name] is a 56 y.o. female presenting for recheck of labs and non specific symptoms.  See my last office note for more details. Since that office visit she has stopped methotrexate and has been restarted on prednisone 5 mg daily by her rheumatologist at [facility].   Today she tells me she is taking Augmentin for a tooth infection and she will be getting a root canal for this in 3 days.   She continues to have nausea. She does have a history of diabetic associates gastroparesis.  She tells me that the non bloody diarrhea she was having that had previously resolved is back with taking Augmentin. She denies cough today and says "that problem cleared up." She has had 9. Denies chest pain but continues to have reflux symptoms and this is worse when she lays down. She takes dexilant BID.  First dose is in the morning and eats within an hour. She does not eat after the nighttime dose.  She has had botox injections to the pyloric sphincter and this has alleviated her nausea for a year in the past.  Her last injection was in November and she is surprised if the nausea were 2/2 to this.   She has a history polymyalgia rheumatica and is seeing rheum this coming Thursday. 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 Eber Hong in [facility].   She sees Dr. Joelyn Oms in nephrology and is due back. This visit was in September of 2017 and she has no follow up scheduled.   She continues to suffer from grief 2/2 the loss of her mother.  She will take Xanax sparingly for stress.  She has a history of PTSD as well.  She does take opioids chronically for pain and has a pain Doctor in [place]. She does see a Engineer, water in [place] every three months. She does feel more alone than usual for her. Denies ideas of suicide but sometimes feels that she would be better off dead. She can't take SSRI because this makes her  incontinent. Her sleep is poor generally and there is no specific pattern.   She is prescribed pro air for a presumed diagnosis of asthma.   She is compliant with CPAP therapy.   She has dentist appointment and rheum coming up.  Pain clinic and psychology in the middle of June.  Sees Dr. Brigitte Pulse in August. No nephrology appointment on the books at this time. No GI appointment scheduled at this time. She last saw him in the middle of May.     She is allergic to dilaudid [hydromorphone hcl]; pregabalin; metoclopramide hcl; neurontin [gabapentin]; and other.   She  has a past medical history of Acute ischemic colitis ([place]); Anemia (05/01/2012); Anxiety; ARF (acute renal failure) ([place]) (12/28/2015); Arthritis; Asthma; Basal cell carcinoma of upper back (~ 2011); Chronic lower back pain; DDD (degenerative disc disease), lumbar; Diarrhea; Fibromyalgia; First degree heart block; Gastro-esophageal reflux; Headache(784.0); Hypertension; Mental disorder; Nerve pain; Neuromuscular disorder ([place]); Pancreatitis; Pneumonia (~ 2014; 08/2015); Polymyalgia rheumatica ([place]) (onset 08/2015); Precancerous melanosis; and Sleep apnea.    She  reports that she has never smoked. She has never used smokeless tobacco. She reports that she does not drink alcohol or use drugs. She  reports that she does not currently engage in sexual activity. The patient  has a past surgical history that includes Shoulder arthroscopy with rotator cuff repair  and open biceps tenodesis (Bilateral, 2010-2013); Nasal septoplasty w/ turbinoplasty (1998); Esophagogastroduodenoscopy (12/21/2011); Flexible sigmoidoscopy (05/01/2012); Excision basal cell carcinoma (~ 2011); Esophagogastroduodenoscopy (N/A, 11/06/2012); Botox injection (N/A, 11/06/2012); Esophagogastroduodenoscopy (N/A, 12/04/2013); Botox injection (N/A, 12/04/2013); Esophagogastroduodenoscopy (N/A, 02/26/2014); Botox injection (N/A, 02/26/2014); Esophagogastroduodenoscopy (egd) with propofol (N/A,  07/08/2015); Botox injection (N/A, 07/08/2015); Laparoscopic cholecystectomy (01/2002); Esophagogastroduodenoscopy (egd) with propofol (N/A, 04/27/2016); Botox injection (N/A, 04/27/2016); and Flexible sigmoidoscopy (Left, 09/28/2016).  Her family history includes Arthritis in her mother and paternal grandmother; COPD in her mother; Cancer in her mother; Heart disease in her mother, paternal grandfather, and paternal grandmother; Hypertension in her mother.  Review of Systems  Constitutional: Negative for chills and fever.  Skin: Negative for itching and rash.    The problem list and medications were reviewed and updated by myself where necessary and exist elsewhere in the encounter.   OBJECTIVE:  BP 122/78 (BP Location: Left Wrist, Patient Position: Sitting, Cuff Size: Normal)   Pulse 73   Temp 98.6 F (37 C) (Oral)   Resp 18   Ht 5' 4.5" (1.638 m)   Wt 287 lb 12.8 oz (130.5 kg)   BMI 48.64 kg/m   Pulse Readings from Last 3 Encounters:  11/17/16 73  11/07/16 75  10/23/16 73   BP Readings from Last 3 Encounters:  11/17/16 122/78  11/07/16 116/81  10/23/16 112/76   Wt Readings from Last 3 Encounters:  11/17/16 287 lb 12.8 oz (130.5 kg)  11/07/16 292 lb (132.5 kg)  11/04/16 287 lb (130.2 kg)   Physical Exam  Constitutional: She is oriented to person, place, and time. She is active.  Non-toxic appearance.  HENT:  Right Ear: Hearing, tympanic membrane, external ear and ear canal normal.  Left Ear: Hearing, tympanic membrane, external ear and ear canal normal.  Nose: Nose normal. Right sinus exhibits no maxillary sinus tenderness and no frontal sinus tenderness. Left sinus exhibits no maxillary sinus tenderness and no frontal sinus tenderness.  Mouth/Throat: Uvula is midline, oropharynx is clear and moist and mucous membranes are normal. Mucous membranes are not dry. No oropharyngeal exudate, posterior oropharyngeal edema or tonsillar abscesses.  Eyes: EOM are normal. Pupils are  equal, round, and reactive to light.  Cardiovascular: Normal rate, regular rhythm, S1 normal, S2 normal, normal heart sounds and intact distal pulses.  Exam reveals no gallop, no friction rub and no decreased pulses.   No murmur heard. Pulmonary/Chest: Effort normal. No stridor. No tachypnea. No respiratory distress. She has no wheezes. She has no rales.  Abdominal: Soft. Normal appearance and bowel sounds are normal. She exhibits no distension and no mass. There is no tenderness. There is no rigidity, no rebound, no guarding and no CVA tenderness.  Musculoskeletal: She exhibits no edema.  Lymphadenopathy:       Head (right side): No submandibular and no tonsillar adenopathy present.       Head (left side): No submandibular and no tonsillar adenopathy present.    She has no cervical adenopathy.  Neurological: She is alert and oriented to person, place, and time. She has normal strength and normal reflexes. She is not disoriented. She displays no atrophy. No cranial nerve deficit or sensory deficit. She exhibits normal muscle tone. Coordination and gait normal.  Skin: Skin is warm and dry. She is not diaphoretic. No pallor.  Psychiatric: Her behavior is normal.    Lab Results  Component Value Date   WBC 9.9 11/17/2016   HGB 12.7 11/17/2016   HCT 36.9 (A) 11/17/2016  MCV 89.3 11/17/2016   PLT 331 10/09/2016    Lab Results  Component Value Date   NA 136 11/17/2016   K 5.4 (H) 11/17/2016   CL 102 11/17/2016   CO2 20 11/17/2016    Lab Results  Component Value Date   CREATININE 1.36 (H) 11/17/2016    Lab Results  Component Value Date   ALT 11 11/17/2016   AST 12 11/17/2016   ALKPHOS 61 11/17/2016   BILITOT <0.2 11/17/2016    Lab Results  Component Value Date   TSH 1.710 07/31/2016    Lab Results  Component Value Date   HGBA1C 6.2 (H) 07/31/2016    Lab Results  Component Value Date   CHOL 188 07/31/2016   HDL 37 (L) 07/31/2016   LDLCALC 90 07/31/2016   TRIG 305  (H) 07/31/2016   CHOLHDL 5.1 (H) 07/31/2016    Lab Results  Component Value Date   ESRSEDRATE 12 11/17/2016   POCTSEDRATE 46 (A) 10/23/2016   Lab Results  Component Value Date   CRP 19.3 (H) 11/17/2016     Results for orders placed or performed in visit on 11/17/16 (from the past 72 hour(s))  CMP14+EGFR     Status: Abnormal   Collection Time: 11/17/16 11:49 AM  Result Value Ref Range   Glucose 130 (H) 65 - 99 mg/dL   BUN 26 (H) 6 - 24 mg/dL   Creatinine, Ser 1.36 (H) 0.57 - 1.00 mg/dL   GFR calc non Af Amer 44 (L) >59 mL/min/1.73   GFR calc Af Amer 51 (L) >59 mL/min/1.73   BUN/Creatinine Ratio 19 9 - 23   Sodium 136 134 - 144 mmol/L   Potassium 5.4 (H) 3.5 - 5.2 mmol/L   Chloride 102 96 - 106 mmol/L   CO2 20 18 - 29 mmol/L    Comment: **Effective November 20, 2016 Carbon Dioxide, Total**   reference interval will be changing to:              Age                  Female          Female      0 days   - 30 days         17 - 40        16 - 88     31 days   -  1 year         15 - 25        15 - 25      2 years  -  5 years        33 - 29        17 - 68      6 years  - 12 years        70 - 79        19 - 107                >12 years        108 - 25        20 - 29    Calcium 9.5 8.7 - 10.2 mg/dL   Total Protein 7.2 6.0 - 8.5 g/dL   Albumin 4.1 3.5 - 5.5 g/dL   Globulin, Total 3.1 1.5 - 4.5 g/dL   Albumin/Globulin Ratio 1.3 1.2 - 2.2   Bilirubin Total <0.2 0.0 - 1.2 mg/dL   Alkaline Phosphatase 61 39 - 117 IU/L  AST 12 0 - 40 IU/L   ALT 11 0 - 32 IU/L  Sedimentation Rate     Status: None   Collection Time: 11/17/16 11:49 AM  Result Value Ref Range   Sed Rate 12 0 - 40 mm/hr  C-reactive protein     Status: Abnormal   Collection Time: 11/17/16 11:49 AM  Result Value Ref Range   CRP 19.3 (H) 0.0 - 4.9 mg/L  Lipase     Status: Abnormal   Collection Time: 11/17/16 11:49 AM  Result Value Ref Range   Lipase 89 (H) 14 - 72 U/L  POCT CBC     Status: Abnormal   Collection Time:  11/17/16 12:00 PM  Result Value Ref Range   WBC 9.9 4.6 - 10.2 K/uL   Lymph, poc 2.2 0.6 - 3.4   POC LYMPH PERCENT 22.3 10 - 50 %L   MID (cbc) 0.5 0 - 0.9   POC MID % 4.6 0 - 12 %M   POC Granulocyte 7.2 (A) 2 - 6.9   Granulocyte percent 73.1 37 - 80 %G   RBC 4.13 4.04 - 5.48 M/uL   Hemoglobin 12.7 12.2 - 16.2 g/dL   HCT, POC 36.9 (A) 37.7 - 47.9 %   MCV 89.3 80 - 97 fL   MCH, POC 30.8 27 - 31.2 pg   MCHC 34.5 31.8 - 35.4 g/dL   RDW, POC 14.6 %   Platelet Count, POC 299 142 - 424 K/uL   MPV 8.3 0 - 99.8 fL  POCT urinalysis dipstick     Status: None   Collection Time: 11/17/16 12:17 PM  Result Value Ref Range   Color, UA yellow yellow   Clarity, UA clear clear   Glucose, UA negative negative mg/dL   Bilirubin, UA negative negative   Ketones, POC UA negative negative mg/dL   Spec Grav, UA 1.020 1.010 - 1.025   Blood, UA negative negative   pH, UA 5.5 5.0 - 8.0   Protein Ur, POC negative negative mg/dL   Urobilinogen, UA 0.2 0.2 or 1.0 E.U./dL   Nitrite, UA Negative Negative   Leukocytes, UA Negative Negative   No results found.  ASSESSMENT AND PLAN:  Sequita was seen today for follow-up.  Diagnoses and all orders for this visit:  Non-intractable cyclical vomiting with nausea -     POCT urinalysis dipstick -     POCT CBC -     CMP14+EGFR -     Sedimentation Rate -     C-reactive protein  Elevated serum creatinine -     Ambulatory referral to Nephrology  Idiopathic chronic pancreatitis ([place]) -     Lipase  Nausea and vomiting, intractability of vomiting not specified, unspecified vomiting type -     ondansetron (ZOFRAN) 4 MG tablet; Take 1 tablet (4 mg total) by mouth every 8 (eight) hours as needed for nausea or vomiting.  Elevated lipase: RTC today for stat labs.  -     Lipase; Future -     Amylase; Future -     Triglycerides; Future  Other orders -     promethazine (PHENERGAN) 25 MG tablet; Take 1 tablet (25 mg total) by mouth every 6 (six) hours as  needed for nausea or vomiting. -     Lipase    The patient is advised to call or return to clinic if she does not see an improvement in symptoms, or to seek the care of the closest emergency department if she  worsens with the above plan.   Philis Fendt, MHS, PA-C [facility] 11/18/2016 11:12 AM

## 2016-11-17 NOTE — Patient Instructions (Addendum)
  Let me know how the nausea is doing after you stop the augmentin.  If you start having severe diarrhea and belly pain then we need to test for C-diff. I want you to see psychiatry but we can wait on this for now.   IF you received an x-ray today, you will receive an invoice from [facility]. Please contact [facility] at [phone] with questions or concerns regarding your invoice.   IF you received labwork today, you will receive an invoice from [place]. Please contact [facility] at [phone] with questions or concerns regarding your invoice.   Our billing staff will not be able to assist you with questions regarding bills from these companies.  You will be contacted with the lab results as soon as they are available. The fastest way to get your results is to activate your My Chart account. Instructions are located on the last page of this paperwork. If you have not heard from [place] regarding the results in 2 weeks, please contact this office.

## 2016-11-18 ENCOUNTER — Emergency Department (HOSPITAL_COMMUNITY): Payer: Medicare Other

## 2016-11-18 ENCOUNTER — Other Ambulatory Visit (INDEPENDENT_AMBULATORY_CARE_PROVIDER_SITE_OTHER): Payer: Medicare Other | Admitting: Physician Assistant

## 2016-11-18 ENCOUNTER — Emergency Department (HOSPITAL_COMMUNITY)
Admission: EM | Admit: 2016-11-18 | Discharge: 2016-11-19 | Disposition: A | Discharge status: Home / Self Care | Payer: Medicare Other | Attending: Emergency Medicine | Admitting: Emergency Medicine

## 2016-11-18 ENCOUNTER — Encounter (HOSPITAL_COMMUNITY): Payer: Self-pay | Admitting: Emergency Medicine

## 2016-11-18 ENCOUNTER — Encounter: Payer: Self-pay | Admitting: Family Medicine

## 2016-11-18 DIAGNOSIS — R7302 Impaired glucose tolerance (oral): Secondary | ICD-10-CM | POA: Diagnosis not present

## 2016-11-18 DIAGNOSIS — J45909 Unspecified asthma, uncomplicated: Secondary | ICD-10-CM | POA: Insufficient documentation

## 2016-11-18 DIAGNOSIS — Z9989 Dependence on other enabling machines and devices: Secondary | ICD-10-CM

## 2016-11-18 DIAGNOSIS — D72829 Elevated white blood cell count, unspecified: Secondary | ICD-10-CM

## 2016-11-18 DIAGNOSIS — R109 Unspecified abdominal pain: Secondary | ICD-10-CM | POA: Diagnosis not present

## 2016-11-18 DIAGNOSIS — R748 Abnormal levels of other serum enzymes: Secondary | ICD-10-CM

## 2016-11-18 DIAGNOSIS — G4733 Obstructive sleep apnea (adult) (pediatric): Secondary | ICD-10-CM

## 2016-11-18 DIAGNOSIS — I129 Hypertensive chronic kidney disease with stage 1 through stage 4 chronic kidney disease, or unspecified chronic kidney disease: Secondary | ICD-10-CM | POA: Insufficient documentation

## 2016-11-18 DIAGNOSIS — M793 Panniculitis, unspecified: Secondary | ICD-10-CM | POA: Diagnosis not present

## 2016-11-18 DIAGNOSIS — R1011 Right upper quadrant pain: Secondary | ICD-10-CM | POA: Insufficient documentation

## 2016-11-18 DIAGNOSIS — N183 Chronic kidney disease, stage 3 unspecified: Secondary | ICD-10-CM | POA: Diagnosis present

## 2016-11-18 DIAGNOSIS — F4320 Adjustment disorder, unspecified: Secondary | ICD-10-CM | POA: Diagnosis not present

## 2016-11-18 DIAGNOSIS — F4321 Adjustment disorder with depressed mood: Secondary | ICD-10-CM | POA: Diagnosis present

## 2016-11-18 DIAGNOSIS — I10 Essential (primary) hypertension: Secondary | ICD-10-CM

## 2016-11-18 DIAGNOSIS — Z85828 Personal history of other malignant neoplasm of skin: Secondary | ICD-10-CM | POA: Diagnosis not present

## 2016-11-18 DIAGNOSIS — Z79899 Other long term (current) drug therapy: Secondary | ICD-10-CM | POA: Insufficient documentation

## 2016-11-18 DIAGNOSIS — R7982 Elevated C-reactive protein (CRP): Secondary | ICD-10-CM | POA: Insufficient documentation

## 2016-11-18 HISTORY — DX: Microscopic polyangiitis: M31.7

## 2016-11-18 LAB — CBC WITH DIFFERENTIAL/PLATELET
BASOS PCT: 0 %
Basophils Absolute: 0 10*3/uL (ref 0.0–0.1)
Eosinophils Absolute: 0.1 10*3/uL (ref 0.0–0.7)
Eosinophils Relative: 1 %
HEMATOCRIT: 40.7 % (ref 36.0–46.0)
HEMOGLOBIN: 13.5 g/dL (ref 12.0–15.0)
Lymphocytes Relative: 18 %
Lymphs Abs: 2.4 10*3/uL (ref 0.7–4.0)
MCH: 31 pg (ref 26.0–34.0)
MCHC: 33.2 g/dL (ref 30.0–36.0)
MCV: 93.3 fL (ref 78.0–100.0)
MONOS PCT: 4 %
Monocytes Absolute: 0.6 10*3/uL (ref 0.1–1.0)
NEUTROS ABS: 10.1 10*3/uL — AB (ref 1.7–7.7)
NEUTROS PCT: 77 %
Platelets: 304 10*3/uL (ref 150–400)
RBC: 4.36 MIL/uL (ref 3.87–5.11)
RDW: 14 % (ref 11.5–15.5)
WBC: 13.1 10*3/uL — AB (ref 4.0–10.5)

## 2016-11-18 LAB — POCT CBC
Granulocyte percent: 76.5 %G (ref 37–80)
HCT, POC: 38.8 % (ref 37.7–47.9)
Hemoglobin: 13 g/dL (ref 12.2–16.2)
LYMPH, POC: 1.9 (ref 0.6–3.4)
MCH, POC: 31 pg (ref 27–31.2)
MCHC: 33.6 g/dL (ref 31.8–35.4)
MCV: 92.3 fL (ref 80–97)
MID (cbc): 0.4 (ref 0–0.9)
MPV: 7.7 fL (ref 0–99.8)
PLATELET COUNT, POC: 277 10*3/uL (ref 142–424)
POC GRANULOCYTE: 7.5 — AB (ref 2–6.9)
POC LYMPH %: 19.8 % (ref 10–50)
POC MID %: 3.7 %M (ref 0–12)
RBC: 4.21 M/uL (ref 4.04–5.48)
RDW, POC: 15.1 %
WBC: 9.8 10*3/uL (ref 4.6–10.2)

## 2016-11-18 LAB — URINALYSIS, ROUTINE W REFLEX MICROSCOPIC
Bilirubin Urine: NEGATIVE
Glucose, UA: NEGATIVE mg/dL
HGB URINE DIPSTICK: NEGATIVE
Ketones, ur: NEGATIVE mg/dL
LEUKOCYTES UA: NEGATIVE
NITRITE: NEGATIVE
PROTEIN: NEGATIVE mg/dL
SPECIFIC GRAVITY, URINE: 1.015 (ref 1.005–1.030)
pH: 5 (ref 5.0–8.0)

## 2016-11-18 LAB — COMPREHENSIVE METABOLIC PANEL
ALK PHOS: 50 U/L (ref 38–126)
ALT: 16 U/L (ref 14–54)
ANION GAP: 9 (ref 5–15)
AST: 19 U/L (ref 15–41)
Albumin: 4.5 g/dL (ref 3.5–5.0)
BUN: 25 mg/dL — ABNORMAL HIGH (ref 6–20)
CALCIUM: 9.5 mg/dL (ref 8.9–10.3)
CHLORIDE: 107 mmol/L (ref 101–111)
CO2: 23 mmol/L (ref 22–32)
Creatinine, Ser: 1.28 mg/dL — ABNORMAL HIGH (ref 0.44–1.00)
GFR, EST AFRICAN AMERICAN: 54 mL/min — AB (ref >60)
GFR, EST NON AFRICAN AMERICAN: 47 mL/min — AB (ref >60)
Glucose, Bld: 108 mg/dL — ABNORMAL HIGH (ref 65–99)
Potassium: 4.9 mmol/L (ref 3.5–5.1)
SODIUM: 139 mmol/L (ref 135–145)
Total Bilirubin: 0.4 mg/dL (ref 0.3–1.2)
Total Protein: 8 g/dL (ref 6.5–8.1)

## 2016-11-18 LAB — CMP14+EGFR
ALBUMIN: 4.1 g/dL (ref 3.5–5.5)
ALK PHOS: 61 IU/L (ref 39–117)
ALT: 11 IU/L (ref 0–32)
AST: 12 IU/L (ref 0–40)
Albumin/Globulin Ratio: 1.3 (ref 1.2–2.2)
BUN / CREAT RATIO: 19 (ref 9–23)
BUN: 26 mg/dL — AB (ref 6–24)
Bilirubin Total: <0.2 mg/dL (ref 0.0–1.2)
CO2: 20 mmol/L (ref 18–29)
CREATININE: 1.36 mg/dL — AB (ref 0.57–1.00)
Calcium: 9.5 mg/dL (ref 8.7–10.2)
Chloride: 102 mmol/L (ref 96–106)
GFR calc Af Amer: 51 mL/min/{1.73_m2} — ABNORMAL LOW (ref >59)
GFR calc non Af Amer: 44 mL/min/{1.73_m2} — ABNORMAL LOW (ref >59)
GLUCOSE: 130 mg/dL — AB (ref 65–99)
Globulin, Total: 3.1 g/dL (ref 1.5–4.5)
Potassium: 5.4 mmol/L — ABNORMAL HIGH (ref 3.5–5.2)
Sodium: 136 mmol/L (ref 134–144)
Total Protein: 7.2 g/dL (ref 6.0–8.5)

## 2016-11-18 LAB — C-REACTIVE PROTEIN: CRP: 19.3 mg/L — ABNORMAL HIGH (ref 0.0–4.9)

## 2016-11-18 LAB — LIPASE, BLOOD: LIPASE: 48 U/L (ref 11–51)

## 2016-11-18 LAB — SEDIMENTATION RATE: SED RATE: 12 mm/h (ref 0–40)

## 2016-11-18 LAB — LIPASE: Lipase: 89 U/L — ABNORMAL HIGH (ref 14–72)

## 2016-11-18 MED ORDER — PROMETHAZINE HCL 25 MG PO TABS: 25.0000 mg | ORAL_TABLET | Freq: Four times a day (QID) | ORAL | 1 refills | Status: DC | PRN

## 2016-11-18 MED ORDER — ONDANSETRON HCL 4 MG/2ML IJ SOLN
4.0000 mg | Freq: Once | INTRAMUSCULAR | Status: AC
  Administered 2016-11-18: 4 mg via INTRAVENOUS
  Filled 2016-11-18: qty 2

## 2016-11-18 MED ORDER — AMOXICILLIN-POT CLAVULANATE 875-125 MG PO TABS
1.0000 | ORAL_TABLET | Freq: Once | ORAL | Status: AC
  Administered 2016-11-18: 1 via ORAL
  Filled 2016-11-18: qty 1

## 2016-11-18 MED ORDER — ONDANSETRON HCL 4 MG PO TABS: 4.0000 mg | ORAL_TABLET | Freq: Three times a day (TID) | ORAL | 0 refills | Status: AC | PRN

## 2016-11-18 MED ORDER — SODIUM CHLORIDE 0.9 % IV BOLUS (SEPSIS)
1000.0000 mL | Freq: Once | INTRAVENOUS | Status: AC
  Administered 2016-11-18: 1000 mL via INTRAVENOUS

## 2016-11-18 NOTE — ED Notes (Addendum)
Pt from home following instructions form her PCP to come here. Pt states she was told she had elevated white count. Pt has had nausea since the end of May with emesis. Pt has hx of pancreatitis. Pt was seen at her PCP yesterday for cyclical vomiting and was told her white count, CRP,  and lipase were elevated. Pt was told to have labs rechecked today. Pt was told her CRP and WBC were increased and she needed to come to the ED. Pt has upper right quad pain. Last emesis was wed. Pt has been managing this with home meds (phenegran and zofran)

## 2016-11-18 NOTE — Progress Notes (Signed)
Addendum: Lipase mildly elevated in the setting of chronic pancreatitis, and her persistent nausea and now with her complaint of upper abdominal pain advised that we cancel stat labs and she proceed to [facility] for further evaluation and management.  Case discussed with Dr. Mitchel Honour and he agrees.  Philis Fendt, MS, PA-C 5:37 PM, 11/18/2016

## 2016-11-18 NOTE — Addendum Note (Signed)
Addended by: Philis Fendt on: 11/18/2016 05:39 PM   Modules accepted: Orders

## 2016-11-18 NOTE — H&P (Signed)
History and Physical    Valerie [name] EYC:[id number] DOB: 1960/09/27 DOA: 11/18/2016  PCP: Shawnee Knapp, MD Consultants:  Marisa Hua; Rheumatology - Marrian Salvage; Nephrology - Joelyn Oms; Pain Clinic - Linford Arnold Patient coming from: Home - lives alone; Used to live in [place] and still goes back to see Pain Clinic; [facility]: friend, "it's all in my files"  Chief Complaint: sent by PCP  HPI: Valerie [name] is a 56 y.o. female with medical history significant of OSA on CPAP (9); microscopic polyangitis on immunosuppression; HTN; PTSD; fibromyalgia on chronic pain medication; ischemic colitis; and CKD presenting after being sent in by her PCP.  She reports that she was admitted 4/14-20 for a GI bleed.  After that, rheumatology diagnosed microscopic polyangitis (previously thought to have PMR).  She started taking methotrexate at the end of April.  May 14, she saw Dr. Brigitte Pulse for feeling bad.  She was concerned for sinusitis and treated her with Augmentin.  May 23, she got n/v which continued for a few hours.  Saw PCP on 5/26. The PA asked her to returned 5/29 and yesterday for follow ups/labs.  They called and asked her to come back today, reported WBC going up since yesterday and sent her to the ER.  Also saw a dentist Monday for concern of abscess, started back on Augmentin.  Vomited again violently on Wednesday AM.    She reports that she does not feel well.  Grief from losing her mother 2 weeks ago - diagnosed with cancer on 2022-09-23 and died on 2022/09/28.  General fatigue, RUQ pain that radiates into the back.    The note from Philis Fendt, PA-C, dated 6/8 but signed 6/9 at 5:37 notes: Lipase mildly elevated in the setting of chronic pancreatitis, and her persistent nausea and now with her complaint of upper abdominal pain advised that we cancel stat labs and she proceed to [facility] for further evaluation and management.   ED Course: Fairly unremarkable evaluation.  Given 1L bolus NS, Zofran, Augmentin.   CT with ? Left-sided panniculitis (pain is on the right side).  Spoke with Dr. Amedeo Plenty who recommended outpatient f/u.  Review of Systems: As per HPI; otherwise review of systems reviewed and negative.   Ambulatory Status:  Ambulates without assistance  Past Medical History:  Diagnosis Date  . Acute ischemic colitis ([place])   . Anemia 05/01/2012  . Anxiety   . ARF (acute renal failure) ([place]) 12/28/2015  . Arthritis    "basically ~ q joint" (12/28/2015)  . Asthma   . Basal cell carcinoma of upper back ~ 2009-09-22  . DDD (degenerative disc disease), lumbar   . Diarrhea    EPISODIC  . Fibromyalgia   . First degree heart block   . Gastro-esophageal reflux   . Headache(784.0)    "muscular w/migraine intensity; ~ 5-6 months" (12/28/2015)  . Hypertension   . Mental disorder    reports PTSD  . Microscopic polyangiitis ([place])   . Nerve pain    nerve damage to right arm  . Neuromuscular disorder ([place])   . Pancreatitis   . Pneumonia ~ 04/13/14Apr 13, 2017  . Polymyalgia rheumatica ([place]) onset 23-Sep-2015   changed diagnosis to MPA  . Precancerous melanosis    "2nd toe left foot; 5th toe of right foot; right temple; had them all cut off"  . Sleep apnea    CPAP-9    Past Surgical History:  Procedure Laterality Date  . BASAL CELL CARCINOMA EXCISION  ~  2011   back  . BOTOX INJECTION N/A 11/06/2012   Procedure: BOTOX INJECTION;  Surgeon: Missy Sabins, MD;  Location: [facility];  Service: Endoscopy;  Laterality: N/A;  . BOTOX INJECTION N/A 12/04/2013   Procedure: BOTOX INJECTION;  Surgeon: Missy Sabins, MD;  Location: [place];  Service: Endoscopy;  Laterality: N/A;  . BOTOX INJECTION N/A 02/26/2014   Procedure: BOTOX INJECTION;  Surgeon: Missy Sabins, MD;  Location: [place];  Service: Endoscopy;  Laterality: N/A;  . BOTOX INJECTION N/A 07/08/2015   Procedure: BOTOX INJECTION;  Surgeon: Teena Irani, MD;  Location: [facility];  Service: Endoscopy;  Laterality: N/A;  . BOTOX INJECTION N/A 04/27/2016     Procedure: BOTOX INJECTION;  Surgeon: Teena Irani, MD;  Location: [facility];  Service: Endoscopy;  Laterality: N/A;  . ESOPHAGOGASTRODUODENOSCOPY  12/21/2011   Procedure: ESOPHAGOGASTRODUODENOSCOPY (EGD);  Surgeon: Missy Sabins, MD;  Location: [facility] ENDOSCOPY;  Service: Endoscopy;  Laterality: N/A;  . ESOPHAGOGASTRODUODENOSCOPY N/A 11/06/2012   Procedure: ESOPHAGOGASTRODUODENOSCOPY (EGD);  Surgeon: Missy Sabins, MD;  Location: [facility];  Service: Endoscopy;  Laterality: N/A;  . ESOPHAGOGASTRODUODENOSCOPY N/A 12/04/2013   Procedure: ESOPHAGOGASTRODUODENOSCOPY (EGD);  Surgeon: Missy Sabins, MD;  Location: [facility]COPY;  Service: Endoscopy;  Laterality: N/A;  . ESOPHAGOGASTRODUODENOSCOPY N/A 02/26/2014   Procedure: ESOPHAGOGASTRODUODENOSCOPY (EGD);  Surgeon: Missy Sabins, MD;  Location: [facility] ENDOSCOPY;  Service: Endoscopy;  Laterality: N/A;  . ESOPHAGOGASTRODUODENOSCOPY (EGD) WITH PROPOFOL N/A 07/08/2015   Procedure: ESOPHAGOGASTRODUODENOSCOPY (EGD) WITH PROPOFOL;  Surgeon: Teena Irani, MD;  Location: [facility];  Service: Endoscopy;  Laterality: N/A;  . ESOPHAGOGASTRODUODENOSCOPY (EGD) WITH PROPOFOL N/A 04/27/2016   Procedure: ESOPHAGOGASTRODUODENOSCOPY (EGD) WITH PROPOFOL;  Surgeon: Teena Irani, MD;  Location: [facility];  Service: Endoscopy;  Laterality: N/A;  . FLEXIBLE SIGMOIDOSCOPY  05/01/2012   Procedure: FLEXIBLE SIGMOIDOSCOPY;  Surgeon: Jeryl Columbia, MD;  Location: [facility];  Service: Endoscopy;  Laterality: N/A;  unprepped  . FLEXIBLE SIGMOIDOSCOPY Left 09/28/2016   Procedure: FLEXIBLE SIGMOIDOSCOPY;  Surgeon: Wilford Corner, MD;  Location: [facility];  Service: Endoscopy;  Laterality: Left;  . LAPAROSCOPIC CHOLECYSTECTOMY  01/2002  . NASAL SEPTOPLASTY W/ TURBINOPLASTY  1998  . SHOULDER ARTHROSCOPY WITH ROTATOR CUFF REPAIR AND OPEN BICEPS TENODESIS Bilateral 2010-2013   left-right    Social History   Social History  . Marital status: Single    Spouse name: N/A  . Number of  children: 0  . Years of education: College   Occupational History  . disabled    Social History Main Topics  . Smoking status: Never Smoker  . Smokeless tobacco: Never Used  . Alcohol use No  . Drug use: No  . Sexual activity: Not Currently   Other Topics Concern  . Not on file   Social History Narrative   Caffeine 2 cups iced tea daily avg.    Allergies  Allergen Reactions  . Dilaudid [Hydromorphone Hcl] Other (See Comments)    Headache   . Pregabalin Other (See Comments)    Drug induced stupor  . Metoclopramide Hcl Other (See Comments)    "crawling inside"  . Neurontin [Gabapentin] Other (See Comments)    Tremors   . Other Other (See Comments)    Avoid all SSRI's and SNRI's- damaged pancreas  . Serotonin Reuptake Inhibitors (Ssris) Other (See Comments)    Damage to pancreas and bowel/urinary incontinence     Family History  Problem Relation Age of Onset  . Heart disease Mother   . COPD Mother   .  Arthritis Mother   . Cancer Mother   . Hypertension Mother   . Arthritis Paternal Grandmother   . Heart disease Paternal Grandmother   . Heart disease Paternal Grandfather     Prior to Admission medications   Medication Sig Start Date End Date Taking? Authorizing Provider  allopurinol (ZYLOPRIM) 300 MG tablet Take 0.5 tablets (150 mg total) by mouth daily. 07/31/16  Yes Shawnee Knapp, MD  ALPRAZolam Duanne Moron) 1 MG tablet Take 1 tablet (1 mg total) by mouth 3 (three) times daily as needed for anxiety. 07/31/16  Yes Shawnee Knapp, MD  amoxicillin-clavulanate (AUGMENTIN) 875-125 MG tablet Take 1 tablet by mouth 2 (two) times daily.   Yes [provider]  benazepril (LOTENSIN) 40 MG tablet Take 1 tablet (40 mg total) by mouth daily. 02/18/16  Yes Darlyne Russian, MD  Biotin 5000 MCG CAPS Take 10,000 mcg by mouth daily.    Yes [provider]  calcium carbonate (OSCAL) 1500 (600 Ca) MG TABS tablet Take 600 mg of elemental calcium by mouth daily with breakfast.   Yes  [provider]  cetirizine (ZYRTEC) 10 MG tablet Take 10 mg by mouth daily. Alternates between Allegra and Zyrtec as needed for allergies   Yes [provider]  dexlansoprazole (DEXILANT) 60 MG capsule Take 60 mg by mouth 2 (two) times daily.   Yes [provider]  fexofenadine (ALLEGRA) 180 MG tablet Take 180 mg by mouth daily as needed. Switches between Allegra and Zyrtec as needed for allergies   Yes [provider]  fluticasone (FLONASE) 50 MCG/ACT nasal spray Place 2 sprays into both nostrils daily. 07/31/16  Yes Shawnee Knapp, MD  folic acid (FOLVITE) 1 MG tablet Take 1 mg by mouth daily. 10/10/16  Yes [provider]  HYDROcodone-acetaminophen (NORCO) 10-325 MG per tablet Take 1-2 tablets by mouth every 6 (six) hours as needed for moderate pain (pain.).    Yes [provider]  hydrOXYzine (ATARAX/VISTARIL) 10 MG tablet Take 1-3 tablets (10-30 mg total) by mouth 3 (three) times daily as needed. 11/04/16  Yes Tereasa Coop, PA-C  metoprolol succinate (TOPROL-XL) 100 MG 24 hr tablet Take 1 tablet (100 mg total) by mouth daily. Take with or immediately following a meal. 07/31/16  Yes Shawnee Knapp, MD  modafinil (PROVIGIL) 200 MG tablet Take 0.5 tablets (100 mg total) by mouth 2 (two) times daily. 10/11/16  Yes Dohmeier, Asencion Partridge, MD  montelukast (SINGULAIR) 10 MG tablet TAKE 1 TABLET (10 MG TOTAL) BY MOUTH AT BEDTIME 07/31/16  Yes Shawnee Knapp, MD  morphine (MS CONTIN) 30 MG 12 hr tablet Take 30 mg by mouth every 8 (eight) hours. Not from [place] 09/23/12  Yes [provider]  Multiple Vitamins-Minerals (MULTIVITAMIN PO) Take 1 tablet by mouth daily.   Yes [provider]  Olopatadine HCl (PAZEO) 0.7 % SOLN Apply 1 drop to eye daily. 08/02/16  Yes Shawnee Knapp, MD  ondansetron (ZOFRAN) 4 MG tablet Take 1 tablet (4 mg total) by mouth every 8 (eight) hours as needed for nausea or vomiting. 11/18/16  Yes Tereasa Coop, PA-C  Pancrelipase,  Lip-Prot-Amyl, (CREON) 24000 UNITS CPEP Take 72,000 Units by mouth 3 (three) times daily as needed (for pancreatitis).    Yes [provider]  predniSONE (DELTASONE) 1 MG tablet Take 4 mg by mouth daily.    Yes [provider]  promethazine (PHENERGAN) 25 MG tablet Take 1 tablet (25 mg total) by mouth every  6 (six) hours as needed for nausea or vomiting. 11/18/16  Yes Tereasa Coop, PA-C  spironolactone (ALDACTONE) 100 MG tablet Take 1 tablet (100 mg total) by mouth daily. 07/31/16  Yes Shawnee Knapp, MD  sucralfate (CARAFATE) 1 GM/10ML suspension Take 10 mLs (1 g total) by mouth 4 (four) times daily as needed (stomach pain). 07/31/16  Yes Shawnee Knapp, MD  augmented betamethasone dipropionate (DIPROLENE-AF) 0.05 % cream Apply topically 2 (two) times daily. Patient not taking: Reported on 11/18/2016 08/02/16   Shawnee Knapp, MD  methotrexate (RHEUMATREX) 2.5 MG tablet Take 10 mg by mouth once a week.  11/04/16   [provider]  PROAIR HFA 108 (90 Base) MCG/ACT inhaler INHALE 2 PUFFS INTO THE LUNGS AS NEEDED FOR WHEEZING OR SHORTNESS OF BREATH. Patient not taking: Reported on 11/18/2016 05/29/16   Shawnee Knapp, MD    Physical Exam: Vitals:   11/18/16 1706 11/18/16 1902 11/18/16 2223  BP: (!) 123/91 111/68 131/75  Pulse: 68 62 70  Resp: 16 18 18   Temp: 98.7 F (37.1 C)    TempSrc: Oral    SpO2: 100% 100% 98%     General: Appears calm and comfortable and is NAD Eyes:  PERRL, EOMI, normal lids, iris ENT:  grossly normal hearing, lips & tongue, mmm Neck:  no LAD, masses or thyromegaly Cardiovascular:  RRR, no m/r/g. No LE edema.  Respiratory:  CTA bilaterally, no w/r/r. Normal respiratory effort. Abdomen:  soft, TTP in RUQ, nd, NABS Skin:  no rash or induration seen on limited exam Musculoskeletal:  grossly normal tone BUE/BLE, good ROM, no bony abnormality Psychiatric: Depressed mood and flat affect, emotionally labile at times, speech fluent and appropriate,  AOx3 Neurologic:  CN 2-12 grossly intact, moves all extremities in coordinated fashion, sensation intact  Labs on Admission: I have personally reviewed following labs and imaging studies  CBC:  Recent Labs Lab 11/17/16 1200 11/18/16 1351 11/18/16 1806 11/18/16 2303  WBC 9.9 9.8 13.1* 10.5  NEUTROABS  --   --  10.1*  --   HGB 12.7 13.0 13.5 12.1  HCT 36.9* 38.8 40.7 37.2  MCV 89.3 92.3 93.3 92.5  PLT  --   --  304 916   Basic Metabolic Panel:  Recent Labs Lab 11/17/16 1149 11/18/16 1806  NA 136 139  K 5.4* 4.9  CL 102 107  CO2 20 23  GLUCOSE 130* 108*  BUN 26* 25*  CREATININE 1.36* 1.28*  CALCIUM 9.5 9.5   GFR: Estimated Creatinine Clearance: 68 mL/min (A) (by C-G formula based on SCr of 1.28 mg/dL (H)). Liver Function Tests:  Recent Labs Lab 11/17/16 1149 11/18/16 1806  AST 12 19  ALT 11 16  ALKPHOS 61 50  BILITOT <0.2 0.4  PROT 7.2 8.0  ALBUMIN 4.1 4.5    Recent Labs Lab 11/17/16 1149 11/18/16 1806  LIPASE 89* 48   No results for input(s): AMMONIA in the last 168 hours. Coagulation Profile: No results for input(s): INR, PROTIME in the last 168 hours. Cardiac Enzymes: No results for input(s): CKTOTAL, CKMB, CKMBINDEX, TROPONINI in the last 168 hours. BNP (last 3 results) No results for input(s): PROBNP in the last 8760 hours. HbA1C: No results for input(s): HGBA1C in the last 72 hours. CBG: No results for input(s): GLUCAP in the last 168 hours. Lipid Profile: No results for input(s): CHOL, HDL, LDLCALC, TRIG, CHOLHDL, LDLDIRECT in the last 72 hours. Thyroid Function Tests: No results for input(s): TSH, T4TOTAL, FREET4, T3FREE,  THYROIDAB in the last 72 hours. Anemia Panel: No results for input(s): VITAMINB12, FOLATE, FERRITIN, TIBC, IRON, RETICCTPCT in the last 72 hours. Urine analysis:    Component Value Date/Time   COLORURINE YELLOW 11/18/2016 1801   APPEARANCEUR CLEAR 11/18/2016 1801   LABSPEC 1.015 11/18/2016 1801   PHURINE 5.0  11/18/2016 1801   GLUCOSEU NEGATIVE 11/18/2016 1801   HGBUR NEGATIVE 11/18/2016 1801   BILIRUBINUR NEGATIVE 11/18/2016 1801   BILIRUBINUR negative 11/17/2016 1217   BILIRUBINUR negative 12/27/2014 0942   KETONESUR NEGATIVE 11/18/2016 1801   PROTEINUR NEGATIVE 11/18/2016 1801   UROBILINOGEN 0.2 11/17/2016 1217   UROBILINOGEN 0.2 02/03/2014 2022   NITRITE NEGATIVE 11/18/2016 1801   LEUKOCYTESUR NEGATIVE 11/18/2016 1801    Creatinine Clearance: Estimated Creatinine Clearance: 68 mL/min (A) (by C-G formula based on SCr of 1.28 mg/dL (H)).  Sepsis Labs: @LABRCNTIP (procalcitonin:4,lacticidven:4) )No results found for this or any previous visit (from the past 240 hour(s)).   Radiological Exams on Admission: Ct Abdomen Pelvis Wo Contrast  Result Date: 11/18/2016 CLINICAL DATA:  Right-sided abdominal pain. EXAM: CT ABDOMEN AND PELVIS WITHOUT CONTRAST TECHNIQUE: Multidetector CT imaging of the abdomen and pelvis was performed following the standard protocol without IV contrast. COMPARISON:  CT 09/24/2016 FINDINGS: Lower chest: The lung bases are clear. No consolidation or pleural fluid. Hepatobiliary: No focal liver abnormality is seen. Status post cholecystectomy. No biliary dilatation. Pancreas: No peripancreatic soft tissue stranding to suggest pancreatitis. No peripancreatic fluid collection. No ductal dilatation or evidence of pancreatic mass. Spleen: Normal in size without focal abnormality. Adrenals/Urinary Tract: Normal adrenal glands. No hydronephrosis or urolithiasis. No perinephric edema. Ureters are decompressed. Urinary bladder is physiologically distended without wall thickening. Stomach/Bowel: The stomach is decompressed. No bowel wall thickening, distention or inflammation. Moderate stool burden throughout the colon. No colonic wall thickening. Previous descending colonic wall thickening has resolved. There a few scattered descending and sigmoid colonic diverticula, no acute  diverticulitis. Normal appendix. Vascular/Lymphatic: Increased number of small mesenteric lymph nodes in the left central mesentery with faint mesenteric edema. No retroperitoneal or pelvic adenopathy. Abdominal aorta is normal in caliber. Reproductive: Uterus and bilateral adnexa are unremarkable. Other: No free air, free fluid, or intra-abdominal fluid collection. Musculoskeletal: There are no acute or suspicious osseous abnormalities. IMPRESSION: 1. Faint mesenteric edema and mesenteric nodes in the left central abdomen, may be mild panniculitis or reactive. 2. No CT findings of acute pancreatitis. 3. No explanation for right-sided abdominal pain. Electronically Signed   By: Jeb Levering M.D.   On: 11/18/2016 21:16   Dg Chest 2 View  Result Date: 11/18/2016 CLINICAL DATA:  Right upper quadrant abdominal pain.  Leukocytosis. EXAM: CHEST  2 VIEW COMPARISON:  02/03/2014 and chest CTA dated 02/03/2014. FINDINGS: Normal sized heart. Interval minimal linear scarring at the left lung base. Otherwise, clear lungs. Mild lower thoracic spine degenerative changes. Bilateral shoulder fixation anchors. IMPRESSION: No acute abnormality. Electronically Signed   By: Claudie Revering M.D.   On: 11/18/2016 19:58    EKG: Not done  Assessment/Plan Principal Problem:   Leukocytosis Active Problems:   Hypertension   OSA on CPAP   CKD (chronic kidney disease) stage 3, GFR 30-59 ml/min   Impaired glucose tolerance   Grief reaction   Pertinent labs: Glucose 108 BUN 25/Creatinine 1.28/GFR 47 - stable WBC 13.1; prior 9.8, prior 9.9 UA negative Lipase 48  Leukocytosis -Uncertain etiology -Patient has essentially negative abdominal CT although she could have a mild panniculitis -She is already taking Augmentin for dental abscess -  Continue Augmentin as an outpatient -Patient thought to be stable for d/c by hospitalist, ER physician, and GI; however, she is uncomfortable about the elevating WBC count -Will  recheck WBC count - if increasing, will observe and continue to trend; if stable/decreasing, will discharge for outpatient f/u -Addendum: repeat WBC 10.5, okay for discharge with outpatient f/u  HTN -Stable  Impaired glucose tolerance -Stable for outpatient f/u  CKD -Stable -Continue to f/u with PCP and nephrology as directed  OSA -Will continue CPAP if patient is observed overnight  Grief reaction -Patient was emotionally labile while discussing her mother's brief illness and her death. -Patient appears to need grief counseling/support and acknowledges no significant support network. -She denies SI/HI. -Suggest referral for grief counseling by PCP.  Chronic pain -I have reviewed this patient in the [place] Controlled Substances Reporting System.  She is receiving his medications from multiple providers but appears to be taking them as prescribed.  Thank you for the consult on this patient.  She appears to be stable and is appropriate for discharge at this time.   Karmen Bongo MD [facility]  If 7PM-7AM, please contact night-coverage [email or web address] Password [email or web address]  11/19/2016, 12:32 AM

## 2016-11-18 NOTE — ED Provider Notes (Signed)
[facility] DEPT Provider Note   CSN: [id number] Arrival date & time: 11/18/16  1652     History   Chief Complaint Chief Complaint  Patient presents with  . Abnormal Lab    HPI Valerie [name] is a 56 y.o. female.  The history is provided by the patient. No language interpreter was used.  Abnormal Lab    Valerie [name] is a 56 y.o. female who presents to the Emergency Department complaining of abnormal lab.  She presents for evaluation of abnormal lab at the urgent of her primary care provider. She has a history of microvascular angiopathy as well as chronic pancreatitis. On May 23 she had an episode of severe vomiting which then resolved. She had recurrent severe vomiting on Wednesday. Last night she developed right upper quadrant pain that is mild to moderate in nature. Today she has some persistent nausea. She was started on Augmentin for an abscessed tooth on Monday and has some mild associated diarrhea. She has been treated with methotrexate for her angiopathy but developed renal insufficiency was transitioned over to prednisone at the end of May. She denies any fevers, chest pain, shortness of breath. She had labs performed as an outpatient yesterday and had an elevation in her CRP as well as lipase and white blood cell count and if she was referred to the emergency department for further evaluation.  Past Medical History:  Diagnosis Date  . Acute ischemic colitis ([place])   . Anemia 05/01/2012  . Anxiety   . ARF (acute renal failure) ([place]) 12/28/2015  . Arthritis    "basically ~ q joint" (12/28/2015)  . Asthma   . Basal cell carcinoma of upper back ~ 2011  . DDD (degenerative disc disease), lumbar   . Diarrhea    EPISODIC  . Fibromyalgia   . First degree heart block   . Gastro-esophageal reflux   . Headache(784.0)    "muscular w/migraine intensity; ~ 5-6 months" (12/28/2015)  . Hypertension   . Mental disorder    reports PTSD  . Microscopic polyangiitis ([place])   .  Nerve pain    nerve damage to right arm  . Neuromuscular disorder ([place])   . Pancreatitis   . Pneumonia ~ 2014; 08/2015  . Polymyalgia rheumatica ([place]) onset 08/2015   changed diagnosis to MPA  . Precancerous melanosis    "2nd toe left foot; 5th toe of right foot; right temple; had them all cut off"  . Sleep apnea    CPAP-9    Patient Active Problem List   Diagnosis Date Noted  . Grief reaction 11/19/2016  . P-ANCA and MPO antibodies positive 10/11/2016  . Dehydration 09/25/2016  . Impaired glucose tolerance 09/25/2016  . Lower GI bleed   . Colitis 09/24/2016  . Colitis, regional, with rectal bleeding ([place]) 09/24/2016  . Gastroparesis 09/18/2016  . Flank pain 09/18/2016  . Nausea without vomiting 09/18/2016  . CKD (chronic kidney disease) stage 3, GFR 30-59 ml/min   . Neoplasm of uncertain behavior of skin 02/03/2014  . Obesity, morbid ([place]) 10/21/2012  . OSA on CPAP 10/21/2012  . Fibromyalgia 06/22/2012  . Back pain 06/22/2012  . Recurrent pancreatitis ([place]) 06/22/2012  . Anemia 05/01/2012  . Leukocytosis 04/28/2012  . Colon polyp 08/01/2011  . PTSD (post-traumatic stress disorder) 08/01/2011  . Hypertension 08/01/2011  . IBS (irritable bowel syndrome) 08/01/2011    Past Surgical History:  Procedure Laterality Date  . BASAL CELL CARCINOMA EXCISION  ~ 2011   back  .  BOTOX INJECTION N/A 11/06/2012   Procedure: BOTOX INJECTION;  Surgeon: Missy Sabins, MD;  Location: [facility] ENDOSCOPY;  Service: Endoscopy;  Laterality: N/A;  . BOTOX INJECTION N/A 12/04/2013   Procedure: BOTOX INJECTION;  Surgeon: Missy Sabins, MD;  Location: [facility];  Service: Endoscopy;  Laterality: N/A;  . BOTOX INJECTION N/A 02/26/2014   Procedure: BOTOX INJECTION;  Surgeon: Missy Sabins, MD;  Location: [facility];  Service: Endoscopy;  Laterality: N/A;  . BOTOX INJECTION N/A 07/08/2015   Procedure: BOTOX INJECTION;  Surgeon: Teena Irani, MD;  Location: [facility];  Service: Endoscopy;  Laterality: N/A;   . BOTOX INJECTION N/A 04/27/2016   Procedure: BOTOX INJECTION;  Surgeon: Teena Irani, MD;  Location: [facility];  Service: Endoscopy;  Laterality: N/A;  . ESOPHAGOGASTRODUODENOSCOPY  12/21/2011   Procedure: ESOPHAGOGASTRODUODENOSCOPY (EGD);  Surgeon: Missy Sabins, MD;  Location: [facility] ENDOSCOPY;  Service: Endoscopy;  Laterality: N/A;  . ESOPHAGOGASTRODUODENOSCOPY N/A 11/06/2012   Procedure: ESOPHAGOGASTRODUODENOSCOPY (EGD);  Surgeon: Missy Sabins, MD;  Location: [facility];  Service: Endoscopy;  Laterality: N/A;  . ESOPHAGOGASTRODUODENOSCOPY N/A 12/04/2013   Procedure: ESOPHAGOGASTRODUODENOSCOPY (EGD);  Surgeon: Missy Sabins, MD;  Location: [facility] ENDOSCOPY;  Service: Endoscopy;  Laterality: N/A;  . ESOPHAGOGASTRODUODENOSCOPY N/A 02/26/2014   Procedure: ESOPHAGOGASTRODUODENOSCOPY (EGD);  Surgeon: Missy Sabins, MD;  Location: [facility] ENDOSCOPY;  Service: Endoscopy;  Laterality: N/A;  . ESOPHAGOGASTRODUODENOSCOPY (EGD) WITH PROPOFOL N/A 07/08/2015   Procedure: ESOPHAGOGASTRODUODENOSCOPY (EGD) WITH PROPOFOL;  Surgeon: Teena Irani, MD;  Location: [facility] ENDOS[facility];  Service: Endoscopy;  Laterality: N/A;  . ESOPHAGOGASTRODUODENOSCOPY (EGD) WITH PROPOFOL N/A 04/27/2016   Procedure: ESOPHAGOGASTRODUODENOSCOPY (EGD) WITH PROPOFOL;  Surgeon: Teena Irani, MD;  Location: [facility] ENDOSCOPY;  Service: Endoscopy;  Laterality: N/A;  . FLEXIBLE SIGMOIDOSCOPY  05/01/2012   Procedure: FLEXIBLE SIGMOIDOSCOPY;  Surgeon: Jeryl Columbia, MD;  Location: [facility] ENDOSCOPY;  Service: Endoscopy;  Laterality: N/A;  unprepped  . FLEXIBLE SIGMOIDOSCOPY Left 09/28/2016   Procedure: FLEXIBLE SIGMOIDOSCOPY;  Surgeon: Wilford Corner, MD;  Location: [facility] ENDOSCOPY;  Service: Endoscopy;  Laterality: Left;  . LAPAROSCOPIC CHOLECYSTECTOMY  01/2002  . NASAL SEPTOPLASTY W/ TURBINOPLASTY  1998  . SHOULDER ARTHROSCOPY WITH ROTATOR CUFF REPAIR AND OPEN BICEPS TENODESIS Bilateral 2010-2013   left-right    OB History    No data available       Home Medications      Prior to Admission medications   Medication Sig Start Date End Date Taking? Authorizing Provider  allopurinol (ZYLOPRIM) 300 MG tablet Take 0.5 tablets (150 mg total) by mouth daily. 07/31/16  Yes Shawnee Knapp, MD  ALPRAZolam Duanne Moron) 1 MG tablet Take 1 tablet (1 mg total) by mouth 3 (three) times daily as needed for anxiety. 07/31/16  Yes Shawnee Knapp, MD  amoxicillin-clavulanate (AUGMENTIN) 875-125 MG tablet Take 1 tablet by mouth 2 (two) times daily.   Yes [provider]  benazepril (LOTENSIN) 40 MG tablet Take 1 tablet (40 mg total) by mouth daily. 02/18/16  Yes Darlyne Russian, MD  Biotin 5000 MCG CAPS Take 10,000 mcg by mouth daily.    Yes [provider]  calcium carbonate (OSCAL) 1500 (600 Ca) MG TABS tablet Take 600 mg of elemental calcium by mouth daily with breakfast.   Yes [provider]  cetirizine (ZYRTEC) 10 MG tablet Take 10 mg by mouth daily. Alternates between Allegra and Zyrtec as needed for allergies   Yes [provider]  dexlansoprazole (DEXILANT) 60 MG capsule Take 60 mg by mouth 2 (two)  times daily.   Yes [provider]  fexofenadine (ALLEGRA) 180 MG tablet Take 180 mg by mouth daily as needed. Switches between Allegra and Zyrtec as needed for allergies   Yes [provider]  fluticasone (FLONASE) 50 MCG/ACT nasal spray Place 2 sprays into both nostrils daily. 07/31/16  Yes Shawnee Knapp, MD  folic acid (FOLVITE) 1 MG tablet Take 1 mg by mouth daily. 10/10/16  Yes [provider]  HYDROcodone-acetaminophen (NORCO) 10-325 MG per tablet Take 1-2 tablets by mouth every 6 (six) hours as needed for moderate pain (pain.).    Yes [provider]  hydrOXYzine (ATARAX/VISTARIL) 10 MG tablet Take 1-3 tablets (10-30 mg total) by mouth 3 (three) times daily as needed. 11/04/16  Yes Tereasa Coop, PA-C  metoprolol succinate (TOPROL-XL) 100 MG 24 hr tablet Take 1 tablet (100 mg total) by mouth daily. Take with or immediately  following a meal. 07/31/16  Yes Shawnee Knapp, MD  modafinil (PROVIGIL) 200 MG tablet Take 0.5 tablets (100 mg total) by mouth 2 (two) times daily. 10/11/16  Yes Dohmeier, Asencion Partridge, MD  montelukast (SINGULAIR) 10 MG tablet TAKE 1 TABLET (10 MG TOTAL) BY MOUTH AT BEDTIME 07/31/16  Yes Shawnee Knapp, MD  morphine (MS CONTIN) 30 MG 12 hr tablet Take 30 mg by mouth every 8 (eight) hours. Not from [facility] 09/23/12  Yes [provider]  Multiple Vitamins-Minerals (MULTIVITAMIN PO) Take 1 tablet by mouth daily.   Yes [provider]  Olopatadine HCl (PAZEO) 0.7 % SOLN Apply 1 drop to eye daily. 08/02/16  Yes Shawnee Knapp, MD  ondansetron (ZOFRAN) 4 MG tablet Take 1 tablet (4 mg total) by mouth every 8 (eight) hours as needed for nausea or vomiting. 11/18/16  Yes Tereasa Coop, PA-C  Pancrelipase, Lip-Prot-Amyl, (CREON) 24000 UNITS CPEP Take 72,000 Units by mouth 3 (three) times daily as needed (for pancreatitis).    Yes [provider]  predniSONE (DELTASONE) 1 MG tablet Take 4 mg by mouth daily.    Yes [provider]  promethazine (PHENERGAN) 25 MG tablet Take 1 tablet (25 mg total) by mouth every 6 (six) hours as needed for nausea or vomiting. 11/18/16  Yes Tereasa Coop, PA-C  spironolactone (ALDACTONE) 100 MG tablet Take 1 tablet (100 mg total) by mouth daily. 07/31/16  Yes Shawnee Knapp, MD  sucralfate (CARAFATE) 1 GM/10ML suspension Take 10 mLs (1 g total) by mouth 4 (four) times daily as needed (stomach pain). 07/31/16  Yes Shawnee Knapp, MD  augmented betamethasone dipropionate (DIPROLENE-AF) 0.05 % cream Apply topically 2 (two) times daily. Patient not taking: Reported on 11/18/2016 08/02/16   Shawnee Knapp, MD  methotrexate (RHEUMATREX) 2.5 MG tablet Take 10 mg by mouth once a week.  11/04/16   [provider]  PROAIR HFA 108 (90 Base) MCG/ACT inhaler INHALE 2 PUFFS INTO THE LUNGS AS NEEDED FOR WHEEZING OR SHORTNESS OF BREATH. Patient not taking: Reported on 11/18/2016 05/29/16    Shawnee Knapp, MD    Family History Family History  Problem Relation Age of Onset  . Heart disease Mother   . COPD Mother   . Arthritis Mother   . Cancer Mother   . Hypertension Mother   . Arthritis Paternal Grandmother   . Heart disease Paternal Grandmother   . Heart disease Paternal Grandfather     Social History Social History  Substance Use Topics  . Smoking status: Never Smoker  . Smokeless tobacco:  Never Used  . Alcohol use No     Allergies   Dilaudid [hydromorphone hcl]; Pregabalin; Metoclopramide hcl; Neurontin [gabapentin]; Other; and Serotonin reuptake inhibitors (ssris)   Review of Systems Review of Systems  All other systems reviewed and are negative.    Physical Exam Updated Vital Signs BP 110/67 (BP Location: Right Arm)   Pulse 62   Temp 97.8 F (36.6 C) (Oral)   Resp 16   SpO2 100%   Physical Exam  Constitutional: She is oriented to person, place, and time. She appears well-developed and well-nourished.  HENT:  Head: Normocephalic and atraumatic.  Cardiovascular: Normal rate and regular rhythm.   No murmur heard. Pulmonary/Chest: Effort normal and breath sounds normal. No respiratory distress.  Abdominal: Soft. There is no rebound and no guarding.  Mild right upper quadrant tenderness  Musculoskeletal: She exhibits no edema or tenderness.  Neurological: She is alert and oriented to person, place, and time.  Skin: Skin is warm and dry.  Psychiatric: She has a normal mood and affect. Her behavior is normal.  Nursing note and vitals reviewed.    ED Treatments / Results  Labs (all labs ordered are listed, but only abnormal results are displayed) Labs Reviewed  COMPREHENSIVE METABOLIC PANEL - Abnormal; Notable for the following:       Result Value   Glucose, Bld 108 (*)    BUN 25 (*)    Creatinine, Ser 1.28 (*)    GFR calc non Af Amer 47 (*)    GFR calc Af Amer 54 (*)    All other components within normal limits  CBC WITH  DIFFERENTIAL/PLATELET - Abnormal; Notable for the following:    WBC 13.1 (*)    Neutro Abs 10.1 (*)    All other components within normal limits  LIPASE, BLOOD  URINALYSIS, ROUTINE W REFLEX MICROSCOPIC  CBC    EKG  EKG Interpretation None       Radiology Ct Abdomen Pelvis Wo Contrast  Result Date: 11/18/2016 CLINICAL DATA:  Right-sided abdominal pain. EXAM: CT ABDOMEN AND PELVIS WITHOUT CONTRAST TECHNIQUE: Multidetector CT imaging of the abdomen and pelvis was performed following the standard protocol without IV contrast. COMPARISON:  CT 09/24/2016 FINDINGS: Lower chest: The lung bases are clear. No consolidation or pleural fluid. Hepatobiliary: No focal liver abnormality is seen. Status post cholecystectomy. No biliary dilatation. Pancreas: No peripancreatic soft tissue stranding to suggest pancreatitis. No peripancreatic fluid collection. No ductal dilatation or evidence of pancreatic mass. Spleen: Normal in size without focal abnormality. Adrenals/Urinary Tract: Normal adrenal glands. No hydronephrosis or urolithiasis. No perinephric edema. Ureters are decompressed. Urinary bladder is physiologically distended without wall thickening. Stomach/Bowel: The stomach is decompressed. No bowel wall thickening, distention or inflammation. Moderate stool burden throughout the colon. No colonic wall thickening. Previous descending colonic wall thickening has resolved. There a few scattered descending and sigmoid colonic diverticula, no acute diverticulitis. Normal appendix. Vascular/Lymphatic: Increased number of small mesenteric lymph nodes in the left central mesentery with faint mesenteric edema. No retroperitoneal or pelvic adenopathy. Abdominal aorta is normal in caliber. Reproductive: Uterus and bilateral adnexa are unremarkable. Other: No free air, free fluid, or intra-abdominal fluid collection. Musculoskeletal: There are no acute or suspicious osseous abnormalities. IMPRESSION: 1. Faint  mesenteric edema and mesenteric nodes in the left central abdomen, may be mild panniculitis or reactive. 2. No CT findings of acute pancreatitis. 3. No explanation for right-sided abdominal pain. Electronically Signed   By: Jeb Levering M.D.   On: 11/18/2016 21:16  Dg Chest 2 View  Result Date: 11/18/2016 CLINICAL DATA:  Right upper quadrant abdominal pain.  Leukocytosis. EXAM: CHEST  2 VIEW COMPARISON:  02/03/2014 and chest CTA dated 02/03/2014. FINDINGS: Normal sized heart. Interval minimal linear scarring at the left lung base. Otherwise, clear lungs. Mild lower thoracic spine degenerative changes. Bilateral shoulder fixation anchors. IMPRESSION: No acute abnormality. Electronically Signed   By: Claudie Revering M.D.   On: 11/18/2016 19:58    Procedures Procedures (including critical care time)  Medications Ordered in ED Medications  sodium chloride 0.9 % bolus 1,000 mL (0 mLs Intravenous Stopped 11/18/16 2237)  ondansetron (ZOFRAN) injection 4 mg (4 mg Intravenous Given 11/18/16 2237)  amoxicillin-clavulanate (AUGMENTIN) 875-125 MG per tablet 1 tablet (1 tablet Oral Given 11/18/16 2242)     Initial Impression / Assessment and Plan / ED Course  I have reviewed the triage vital signs and the nursing notes.  Pertinent labs & imaging results that were available during my care of the patient were reviewed by me and considered in my medical decision making (see chart for details).     Pt with hx/o MPA, chronic pancreatitis here with RUQ pain, referral for elevated lipase.  On review of outside labs lipase mildly elevated and improved on recheck.  BMP with stable renal function.  CBC with mild leukocytosis on initial check, improved on recheck.  No evidence of serious bacterial infection. CT abd with possible panniculitis, not in the patient's region of pain.  D/w on call for [facility] - recommend outpatient follow up.  D/w pt findings of studies and plan to follow up with PCP and GI.  Pt declined  pain meds in ED or on discharge, has anti emetics at home.  Home care and return precautions discussed.    Final Clinical Impressions(s) / ED Diagnoses   Final diagnoses:  Right upper quadrant abdominal pain  Panniculitis    New Prescriptions Discharge Medication List as of 11/19/2016 12:35 AM       Quintella Reichert, MD 11/19/16 [phone]

## 2016-11-19 DIAGNOSIS — F4321 Adjustment disorder with depressed mood: Secondary | ICD-10-CM | POA: Diagnosis present

## 2016-11-19 LAB — CBC
HCT: 37.2 % (ref 36.0–46.0)
Hemoglobin: 12.1 g/dL (ref 12.0–15.0)
MCH: 30.1 pg (ref 26.0–34.0)
MCHC: 32.5 g/dL (ref 30.0–36.0)
MCV: 92.5 fL (ref 78.0–100.0)
PLATELETS: 256 10*3/uL (ref 150–400)
RBC: 4.02 MIL/uL (ref 3.87–5.11)
RDW: 14.1 % (ref 11.5–15.5)
WBC: 10.5 10*3/uL (ref 4.0–10.5)

## 2016-11-22 DIAGNOSIS — R768 Other specified abnormal immunological findings in serum: Secondary | ICD-10-CM | POA: Diagnosis not present

## 2016-11-22 NOTE — Telephone Encounter (Signed)
error 

## 2016-11-23 DIAGNOSIS — M792 Neuralgia and neuritis, unspecified: Secondary | ICD-10-CM | POA: Diagnosis not present

## 2016-11-23 DIAGNOSIS — R768 Other specified abnormal immunological findings in serum: Secondary | ICD-10-CM | POA: Diagnosis not present

## 2016-11-25 ENCOUNTER — Ambulatory Visit (INDEPENDENT_AMBULATORY_CARE_PROVIDER_SITE_OTHER): Payer: Medicare Other | Admitting: Family Medicine

## 2016-11-25 ENCOUNTER — Encounter: Payer: Self-pay | Admitting: Family Medicine

## 2016-11-25 VITALS — BP 113/74 | HR 78 | Temp 98.7°F | Resp 16 | Ht 64.0 in | Wt 286.0 lb

## 2016-11-25 DIAGNOSIS — K50111 Crohn's disease of large intestine with rectal bleeding: Secondary | ICD-10-CM

## 2016-11-25 DIAGNOSIS — F4321 Adjustment disorder with depressed mood: Secondary | ICD-10-CM

## 2016-11-25 DIAGNOSIS — F4320 Adjustment disorder, unspecified: Secondary | ICD-10-CM

## 2016-11-25 DIAGNOSIS — K580 Irritable bowel syndrome with diarrhea: Secondary | ICD-10-CM | POA: Diagnosis not present

## 2016-11-25 DIAGNOSIS — R809 Proteinuria, unspecified: Secondary | ICD-10-CM | POA: Diagnosis not present

## 2016-11-25 DIAGNOSIS — R1084 Generalized abdominal pain: Secondary | ICD-10-CM | POA: Diagnosis not present

## 2016-11-25 DIAGNOSIS — R7302 Impaired glucose tolerance (oral): Secondary | ICD-10-CM | POA: Diagnosis not present

## 2016-11-25 DIAGNOSIS — R768 Other specified abnormal immunological findings in serum: Secondary | ICD-10-CM

## 2016-11-25 DIAGNOSIS — M317 Microscopic polyangiitis: Secondary | ICD-10-CM | POA: Diagnosis not present

## 2016-11-25 DIAGNOSIS — R197 Diarrhea, unspecified: Secondary | ICD-10-CM

## 2016-11-25 DIAGNOSIS — N183 Chronic kidney disease, stage 3 unspecified: Secondary | ICD-10-CM

## 2016-11-25 DIAGNOSIS — K3184 Gastroparesis: Secondary | ICD-10-CM | POA: Diagnosis not present

## 2016-11-25 LAB — POCT URINALYSIS DIP (MANUAL ENTRY)
Bilirubin, UA: NEGATIVE
GLUCOSE UA: NEGATIVE mg/dL
Ketones, POC UA: NEGATIVE mg/dL
Leukocytes, UA: NEGATIVE
NITRITE UA: NEGATIVE
PH UA: 5 (ref 5.0–8.0)
RBC UA: NEGATIVE
Spec Grav, UA: 1.01 (ref 1.010–1.025)
UROBILINOGEN UA: 0.2 U/dL

## 2016-11-25 LAB — IFOBT (OCCULT BLOOD): IFOBT: NEGATIVE

## 2016-11-25 LAB — POCT CBC
GRANULOCYTE PERCENT: 72.2 % (ref 37–80)
HCT, POC: 35.3 % — AB (ref 37.7–47.9)
HEMOGLOBIN: 12.4 g/dL (ref 12.2–16.2)
Lymph, poc: 2.6 (ref 0.6–3.4)
MCH, POC: 31.3 pg — AB (ref 27–31.2)
MCHC: 35.1 g/dL (ref 31.8–35.4)
MCV: 89.1 fL (ref 80–97)
MID (cbc): 0.5 (ref 0–0.9)
MPV: 8.4 fL (ref 0–99.8)
PLATELET COUNT, POC: 299 10*3/uL (ref 142–424)
POC Granulocyte: 8.1 — AB (ref 2–6.9)
POC LYMPH PERCENT: 23.3 %L (ref 10–50)
POC MID %: 4.5 %M (ref 0–12)
RBC: 3.97 M/uL — AB (ref 4.04–5.48)
RDW, POC: 14.9 %
WBC: 11.2 10*3/uL — AB (ref 4.6–10.2)

## 2016-11-25 LAB — POC MICROSCOPIC URINALYSIS (UMFC): Mucus: ABSENT

## 2016-11-25 MED ORDER — PREDNISONE 20 MG PO TABS: ORAL_TABLET | ORAL | 0 refills | Status: DC

## 2016-11-25 NOTE — Progress Notes (Addendum)
Subjective:  By signing my name below, I, Essence Howell, attest that this documentation has been prepared under the direction and in the presence of Delman Cheadle, MD Electronically Signed: Ladene Artist, ED Scribe 11/25/2016 at 3:49 PM.   Patient ID: Valerie [name], female    DOB: 02-Oct-1960, 56 y.o.   MRN: [id number]  Chief Complaint  Patient presents with  . Abdominal Pain  . elevated WBC  . Diarrhea   HPI Valerie [name] is a 56 y.o. female who presents to Primary Care at [facility]. Pt has a h/o microscopic angiopathy and chronic pancreatitis which flared with nausea and vomiting approximately 3 weeks ago 5/23, self resolved, flared again 10 days ago. Seen in the office for the second time by my partner Clark. She had been switched back to Prednisone from methotrexate due to developing renal insufficiency. On recheck in office 2 days after her last episode of nausea and vomiting. Lipase was mildly elevated at 89 which was new. Blood cell count was normal but slight left shift with 7.2% granulocytes so advised to go to the ER for repeat labs and further evaluation and treated with Zofran in the office and antiemetics. On recheck in ER, lipase had normalized, WBC was mildly elevated at 13.1, CT abdomen showed possible panniculitis but didn't coordinate with region of pt's pain in RUQ. Pt was on Augmentin for dental work which was continued and GI recommended outpatient follow-up. She was seen by her rheumatologist Dr Graylin Shiver 2 days ago. Most recent episode of hospitalization was for ischemic colitis when she came off Prednisone. Pt did respond to methotrexate. Labs repeated 3 days prior showed WBC count 11.1, hgb 12.5, platelets 288, creatinine 1.38, BUN 30, K 5.1, sodium 136, transaminases normal, normal albumin protein, normal UA, ESR 33. Continued Predisone 4 mg daily and advised to continue with nephrology referral but has not had a consult yet. Dr. Graylin Shiver wanted to see colon biopsy report so I will  have this faxed over. Advised to repeat labs in 2 weeks. She is to recheck with rheumatology in September.   Pt finished Augmentin yesterday and states dental pain resolved last week. She is still experiencing mild RUQ abdominal pain, nausea, diarrhea (occasionally with mucus), intermittent vomiting, increased fluctuance, fatigue and loss of appetite. Pt states that she has only been consuming enough scrambled eggs, Gatorade and protein shakes to take her medication. Pt does not feel like she is getting full quickly while eating. States symptoms do not feel similar to pancreatis or ischemic colitis. She denies fever, chills, diaphoresis, melena, blood in stools, changes in urine, HA, sinus pain/pressure, postnasal drip. Sometimes pt uses Carafate several days in a row but sometimes she does not use it for a month. She alternated between Phenergan and Zofran, depending on the time of day and activities that she has planned for the day. No recent changes in doses of pain medications other than Hydrocodone which she increased to tid for back pain but decreased again last week. Pt is still taking methotrexate; she has not noticed any significant improvement in leg pain with switching to Prednisone.   Leg Pain Pt reports constant bilateral leg pain, right worse than left, for several weeks. She states that lower leg pain has gradually improved, currently rating pain 3/10 after taking Morphine this morning, 10/10 at worst. She denies muscle cramps.   Anxiety Hydroxyzine has improved stress, anxiety and difficulty sleeping. States she has still been in touch with her psychologist Sabra Heck for  PTSD; next appointment on 6/19. She denies side-effects of dry mouth or blurred vision.   Wt Readings from Last 3 Encounters:  11/25/16 286 lb (129.7 kg)  11/17/16 287 lb 12.8 oz (130.5 kg)  11/07/16 292 lb (132.5 kg)   Past Medical History:  Diagnosis Date  . Acute ischemic colitis ([place])   . Anemia 05/01/2012  .  Anxiety   . ARF (acute renal failure) ([place]) 12/28/2015  . Arthritis    "basically ~ q joint" (12/28/2015)  . Asthma   . Basal cell carcinoma of upper back ~ 2011  . DDD (degenerative disc disease), lumbar   . Diarrhea    EPISODIC  . Fibromyalgia   . First degree heart block   . Gastro-esophageal reflux   . Headache(784.0)    "muscular w/migraine intensity; ~ 5-6 months" (12/28/2015)  . Hypertension   . Mental disorder    reports PTSD  . Microscopic polyangiitis ([place])   . Nerve pain    nerve damage to right arm  . Neuromuscular disorder ([place])   . Pancreatitis   . Pneumonia ~ 2014; 08/2015  . Polymyalgia rheumatica ([place]) onset 08/2015   changed diagnosis to MPA  . Precancerous melanosis    "2nd toe left foot; 5th toe of right foot; right temple; had them all cut off"  . Sleep apnea    CPAP-9   Current Outpatient Prescriptions on File Prior to Visit  Medication Sig Dispense Refill  . allopurinol (ZYLOPRIM) 300 MG tablet Take 0.5 tablets (150 mg total) by mouth daily. 90 tablet 1  . ALPRAZolam (XANAX) 1 MG tablet Take 1 tablet (1 mg total) by mouth 3 (three) times daily as needed for anxiety. 90 tablet 1  . benazepril (LOTENSIN) 40 MG tablet Take 1 tablet (40 mg total) by mouth daily. 90 tablet 3  . Biotin 5000 MCG CAPS Take 10,000 mcg by mouth daily.     . calcium carbonate (OSCAL) 1500 (600 Ca) MG TABS tablet Take 600 mg of elemental calcium by mouth daily with breakfast.    . cetirizine (ZYRTEC) 10 MG tablet Take 10 mg by mouth daily. Alternates between Allegra and Zyrtec as needed for allergies    . dexlansoprazole (DEXILANT) 60 MG capsule Take 60 mg by mouth 2 (two) times daily.    . fexofenadine (ALLEGRA) 180 MG tablet Take 180 mg by mouth daily as needed. Switches between Allegra and Zyrtec as needed for allergies    . fluticasone (FLONASE) 50 MCG/ACT nasal spray Place 2 sprays into both nostrils daily. 48 g 2  . folic acid (FOLVITE) 1 MG tablet Take 1 mg by mouth daily.    Marland Kitchen  HYDROcodone-acetaminophen (NORCO) 10-325 MG per tablet Take 1-2 tablets by mouth every 6 (six) hours as needed for moderate pain (pain.).     Marland Kitchen hydrOXYzine (ATARAX/VISTARIL) 10 MG tablet Take 1-3 tablets (10-30 mg total) by mouth 3 (three) times daily as needed. 90 tablet 0  . methotrexate (RHEUMATREX) 2.5 MG tablet Take 10 mg by mouth once a week.     . metoprolol succinate (TOPROL-XL) 100 MG 24 hr tablet Take 1 tablet (100 mg total) by mouth daily. Take with or immediately following a meal. 90 tablet 3  . modafinil (PROVIGIL) 200 MG tablet Take 0.5 tablets (100 mg total) by mouth 2 (two) times daily. 90 tablet 1  . montelukast (SINGULAIR) 10 MG tablet TAKE 1 TABLET (10 MG TOTAL) BY MOUTH AT BEDTIME 90 tablet 3  . morphine (MS CONTIN) 30  MG 12 hr tablet Take 30 mg by mouth every 8 (eight) hours. Not from [place]    . Multiple Vitamins-Minerals (MULTIVITAMIN PO) Take 1 tablet by mouth daily.    . Olopatadine HCl (PAZEO) 0.7 % SOLN Apply 1 drop to eye daily. 2.5 mL 11  . ondansetron (ZOFRAN) 4 MG tablet Take 1 tablet (4 mg total) by mouth every 8 (eight) hours as needed for nausea or vomiting. 20 tablet 0  . Pancrelipase, Lip-Prot-Amyl, (CREON) 24000 UNITS CPEP Take 72,000 Units by mouth 3 (three) times daily as needed (for pancreatitis).     . predniSONE (DELTASONE) 1 MG tablet Take 4 mg by mouth daily.     Marland Kitchen PROAIR HFA 108 (90 Base) MCG/ACT inhaler INHALE 2 PUFFS INTO THE LUNGS AS NEEDED FOR WHEEZING OR SHORTNESS OF BREATH. 8.5 each 0  . promethazine (PHENERGAN) 25 MG tablet Take 1 tablet (25 mg total) by mouth every 6 (six) hours as needed for nausea or vomiting. 30 tablet 1  . spironolactone (ALDACTONE) 100 MG tablet Take 1 tablet (100 mg total) by mouth daily. 90 tablet 3  . sucralfate (CARAFATE) 1 GM/10ML suspension Take 10 mLs (1 g total) by mouth 4 (four) times daily as needed (stomach pain). 420 mL 1   No current facility-administered medications on file prior to visit.    Allergies  Allergen  Reactions  . Dilaudid [Hydromorphone Hcl] Other (See Comments)    Headache   . Pregabalin Other (See Comments)    Drug induced stupor  . Metoclopramide Hcl Other (See Comments)    "crawling inside"  . Neurontin [Gabapentin] Other (See Comments)    Tremors   . Other Other (See Comments)    Avoid all SSRI's and SNRI's- damaged pancreas  . Serotonin Reuptake Inhibitors (Ssris) Other (See Comments)    Damage to pancreas and bowel/urinary incontinence    Past Medical History:  Diagnosis Date  . Acute ischemic colitis ([place])   . Anemia 05/01/2012  . Anxiety   . ARF (acute renal failure) ([place]) 12/28/2015  . Arthritis    "basically ~ q joint" (12/28/2015)  . Asthma   . Basal cell carcinoma of upper back ~ 2011  . DDD (degenerative disc disease), lumbar   . Diarrhea    EPISODIC  . Fibromyalgia   . First degree heart block   . Gastro-esophageal reflux   . Headache(784.0)    "muscular w/migraine intensity; ~ 5-6 months" (12/28/2015)  . Hypertension   . Mental disorder    reports PTSD  . Microscopic polyangiitis ([place])   . Nerve pain    nerve damage to right arm  . Neuromuscular disorder ([place])   . Pancreatitis   . Pneumonia ~ 2014; 08/2015  . Polymyalgia rheumatica ([place]) onset 08/2015   changed diagnosis to MPA  . Precancerous melanosis    "2nd toe left foot; 5th toe of right foot; right temple; had them all cut off"  . Sleep apnea    CPAP-9   Social History   Social History  . Marital status: Single    Spouse name: N/A  . Number of children: 0  . Years of education: College   Occupational History  . disabled    Social History Main Topics  . Smoking status: Never Smoker  . Smokeless tobacco: Never Used  . Alcohol use No  . Drug use: No  . Sexual activity: Not Currently   Other Topics Concern  . None   Social History Narrative   Caffeine 2  cups iced tea daily avg.   Family History  Problem Relation Age of Onset  . Heart disease Mother   . COPD Mother   .  Arthritis Mother   . Cancer Mother   . Hypertension Mother   . Arthritis Paternal Grandmother   . Heart disease Paternal Grandmother   . Heart disease Paternal Grandfather    Depression screen [facility] 2/9 11/25/2016 11/17/2016 10/23/2016 10/09/2016 09/18/2016  Decreased Interest 1 2 0 0 0  Down, Depressed, Hopeless 1 2 0 0 0  PHQ - 2 Score 2 4 0 0 0  Altered sleeping 0 2 - - -  Tired, decreased energy 0 2 - - -  Change in appetite 0 2 - - -  Feeling bad or failure about yourself  0 1 - - -  Trouble concentrating 0 2 - - -  Moving slowly or fidgety/restless 0 1 - - -  Suicidal thoughts 0 0 - - -  PHQ-9 Score 2 14 - - -  Difficult doing work/chores Somewhat difficult Very difficult - - -  Some recent data might be hidden    Review of Systems  Constitutional: Positive for appetite change and fatigue. Negative for chills, diaphoresis and fever.  HENT: Negative for postnasal drip, sinus pain and sinus pressure.   Eyes: Negative for visual disturbance.  Gastrointestinal: Positive for abdominal pain, diarrhea, nausea and vomiting. Negative for blood in stool.  Endocrine: Negative for polydipsia.  Musculoskeletal: Positive for myalgias.  Neurological: Negative for headaches.      Objective:   Physical Exam  Constitutional: She is oriented to person, place, and time. She appears well-developed and well-nourished. No distress.  HENT:  Head: Normocephalic and atraumatic.  Eyes: Conjunctivae and EOM are normal.  Neck: Neck supple. No tracheal deviation present.  Cardiovascular: Normal rate, regular rhythm, S1 normal, S2 normal and normal heart sounds.   Pulses:      Dorsalis pedis pulses are 2+ on the right side, and 2+ on the left side.  Pulmonary/Chest: Effort normal and breath sounds normal. No respiratory distress.  Bowel sounds heard in chest. Lungs clear.  Abdominal: Soft. She exhibits distension (mild). There is generalized tenderness. There is guarding (mild). There is no rebound.    Tympanic bowel sounds. Most pain in the RUQ and epigastric. No referred pain.  Genitourinary: Rectal exam shows external hemorrhoid.  Genitourinary Comments: Chaperone present. Non-inflamed external hemorrhoid. Normal rectum. Tiny amount of soft stool in vault. No gross bleeding.  Musculoskeletal: Normal range of motion. She exhibits no edema.  Neurological: She is alert and oriented to person, place, and time.  Skin: Skin is warm and dry.  Psychiatric: She has a normal mood and affect. Her behavior is normal.  Nursing note and vitals reviewed.  BP 113/74   Pulse 78   Temp 98.7 F (37.1 C) (Oral)   Resp 16   Ht '5\' 4"'  (1.626 m)   Wt 286 lb (129.7 kg)   SpO2 95%   BMI 49.09 kg/m     Results for orders placed or performed in visit on 11/25/16  POCT CBC  Result Value Ref Range   WBC 11.2 (A) 4.6 - 10.2 K/uL   Lymph, poc 2.6 0.6 - 3.4   POC LYMPH PERCENT 23.3 10 - 50 %L   MID (cbc) 0.5 0 - 0.9   POC MID % 4.5 0 - 12 %M   POC Granulocyte 8.1 (A) 2 - 6.9   Granulocyte percent 72.2 37 - 80 %  G   RBC 3.97 (A) 4.04 - 5.48 M/uL   Hemoglobin 12.4 12.2 - 16.2 g/dL   HCT, POC 35.3 (A) 37.7 - 47.9 %   MCV 89.1 80 - 97 fL   MCH, POC 31.3 (A) 27 - 31.2 pg   MCHC 35.1 31.8 - 35.4 g/dL   RDW, POC 14.9 %   Platelet Count, POC 299 142 - 424 K/uL   MPV 8.4 0 - 99.8 fL   Assessment & Plan:  . 1. Generalized abdominal pain   2. Diarrhea, unspecified type   3. Proteinuria, unspecified type   4. Stage 3 chronic kidney disease   5. Microscopic polyangiitis ([place])   6. Irritable bowel syndrome with diarrhea   7. Gastroparesis   8. Colitis, regional, with rectal bleeding ([place])   9. CKD (chronic kidney disease) stage 3, GFR 30-59 ml/min   10. P-ANCA and MPO antibodies positive   11. Grief reaction   12. Impaired glucose tolerance     Orders Placed This Encounter  Procedures  . CBC with Differential/Platelet  . Sedimentation Rate  . C-reactive protein  . Comprehensive metabolic panel   . Lipase  . GI Profile, Stool, PCR  . POCT urinalysis dipstick  . POCT Microscopic Urinalysis ([facility])  . IFOBT POC (occult bld, rslt in office)    Standing Status:   Future    Number of Occurrences:   1    Standing Expiration Date:   11/25/2017  . POCT CBC    Meds ordered this encounter  Medications  . Cholecalciferol (VITAMIN D3) 5000 units CAPS    Sig: Take 1 capsule by mouth.  . predniSONE (DELTASONE) 20 MG tablet    Sig: Take 3 tabs qd x 3d, then 2 tabs qd x 3d then 1 tab qd x 3d.    Dispense:  18 tablet    Refill:  0   Over 40 min spent in face-to-face evaluation of and consultation with patient and coordination of care.  Over 50% of this time was spent counseling this patient.  I personally performed the services described in this documentation, which was scribed in my presence. The recorded information has been reviewed and considered, and addended by me as needed.   Delman Cheadle, M.D.  Primary Care at [facility] [address] [place], [place] [address] [phone] phone [phone] fax  11/28/16 12:19 AM

## 2016-11-25 NOTE — Patient Instructions (Addendum)
Lets try a higher dose of prednisone in hopes of getting a flair of the autoimmune portion of your condition to calm down.  I have a VERY low threshold to start an antibiotic (likely ciprofloxacin and possibly metronidazole).  So if you are having any worse abdominal pain, diarrhea, nausea, tenderness on your abdomen or any redness or warmth to your abdomen, please call AND mychart in immediately.     IF you received an x-ray today, you will receive an invoice from [facility]. Please contact [facility] at [phone] with questions or concerns regarding your invoice.   IF you received labwork today, you will receive an invoice from [facility]. Please contact [facility] at [phone] with questions or concerns regarding your invoice.   Our billing staff will not be able to assist you with questions regarding bills from these companies.  You will be contacted with the lab results as soon as they are available. The fastest way to get your results is to activate your My Chart account. Instructions are located on the last page of this paperwork. If you have not heard from [place] regarding the results in 2 weeks, please contact this office.

## 2016-11-26 ENCOUNTER — Encounter: Payer: Self-pay | Admitting: Family Medicine

## 2016-11-26 ENCOUNTER — Encounter: Payer: Self-pay | Admitting: Physician Assistant

## 2016-11-26 LAB — CBC WITH DIFFERENTIAL/PLATELET
Basophils Absolute: 0 10*3/uL (ref 0.0–0.2)
Basos: 0 %
EOS (ABSOLUTE): 0.1 10*3/uL (ref 0.0–0.4)
EOS: 1 %
HEMATOCRIT: 36.9 % (ref 34.0–46.6)
HEMOGLOBIN: 12.4 g/dL (ref 11.1–15.9)
IMMATURE GRANULOCYTES: 0 %
Immature Grans (Abs): 0 10*3/uL (ref 0.0–0.1)
Lymphocytes Absolute: 2.6 10*3/uL (ref 0.7–3.1)
Lymphs: 24 %
MCH: 30.9 pg (ref 26.6–33.0)
MCHC: 33.6 g/dL (ref 31.5–35.7)
MCV: 92 fL (ref 79–97)
MONOCYTES: 6 %
MONOS ABS: 0.6 10*3/uL (ref 0.1–0.9)
NEUTROS PCT: 69 %
Neutrophils Absolute: 7.4 10*3/uL — ABNORMAL HIGH (ref 1.4–7.0)
Platelets: 323 10*3/uL (ref 150–379)
RBC: 4.01 x10E6/uL (ref 3.77–5.28)
RDW: 14.2 % (ref 12.3–15.4)
WBC: 10.7 10*3/uL (ref 3.4–10.8)

## 2016-11-26 LAB — COMPREHENSIVE METABOLIC PANEL
ALT: 14 IU/L (ref 0–32)
AST: 14 IU/L (ref 0–40)
Albumin/Globulin Ratio: 1.5 (ref 1.2–2.2)
Albumin: 4.3 g/dL (ref 3.5–5.5)
Alkaline Phosphatase: 55 IU/L (ref 39–117)
BUN/Creatinine Ratio: 22 (ref 9–23)
BUN: 40 mg/dL — AB (ref 6–24)
CHLORIDE: 99 mmol/L (ref 96–106)
CO2: 21 mmol/L (ref 20–29)
Calcium: 9.6 mg/dL (ref 8.7–10.2)
Creatinine, Ser: 1.85 mg/dL — ABNORMAL HIGH (ref 0.57–1.00)
GFR calc non Af Amer: 30 mL/min/{1.73_m2} — ABNORMAL LOW (ref >59)
GFR, EST AFRICAN AMERICAN: 35 mL/min/{1.73_m2} — AB (ref >59)
GLUCOSE: 120 mg/dL — AB (ref 65–99)
Globulin, Total: 2.8 g/dL (ref 1.5–4.5)
Potassium: 5.6 mmol/L — ABNORMAL HIGH (ref 3.5–5.2)
Sodium: 134 mmol/L (ref 134–144)
TOTAL PROTEIN: 7.1 g/dL (ref 6.0–8.5)

## 2016-11-26 LAB — LIPASE: LIPASE: 63 U/L (ref 14–72)

## 2016-11-26 LAB — C-REACTIVE PROTEIN: CRP: 23.4 mg/L — AB (ref 0.0–4.9)

## 2016-11-26 LAB — SEDIMENTATION RATE: SED RATE: 9 mm/h (ref 0–40)

## 2016-11-27 ENCOUNTER — Encounter: Payer: Self-pay | Admitting: Family Medicine

## 2016-11-27 ENCOUNTER — Ambulatory Visit (INDEPENDENT_AMBULATORY_CARE_PROVIDER_SITE_OTHER): Payer: Medicare Other | Admitting: Family Medicine

## 2016-11-27 VITALS — BP 108/71 | HR 57 | Resp 16 | Ht 64.0 in | Wt 286.0 lb

## 2016-11-27 DIAGNOSIS — N179 Acute kidney failure, unspecified: Secondary | ICD-10-CM | POA: Diagnosis not present

## 2016-11-27 DIAGNOSIS — E860 Dehydration: Secondary | ICD-10-CM

## 2016-11-27 DIAGNOSIS — M317 Microscopic polyangiitis: Secondary | ICD-10-CM

## 2016-11-27 DIAGNOSIS — R110 Nausea: Secondary | ICD-10-CM

## 2016-11-27 DIAGNOSIS — R809 Proteinuria, unspecified: Secondary | ICD-10-CM | POA: Diagnosis not present

## 2016-11-27 DIAGNOSIS — K580 Irritable bowel syndrome with diarrhea: Secondary | ICD-10-CM

## 2016-11-27 DIAGNOSIS — R768 Other specified abnormal immunological findings in serum: Secondary | ICD-10-CM

## 2016-11-27 DIAGNOSIS — E875 Hyperkalemia: Secondary | ICD-10-CM

## 2016-11-27 DIAGNOSIS — N183 Chronic kidney disease, stage 3 (moderate): Secondary | ICD-10-CM

## 2016-11-27 DIAGNOSIS — R197 Diarrhea, unspecified: Secondary | ICD-10-CM | POA: Diagnosis not present

## 2016-11-27 LAB — POCT URINALYSIS DIP (MANUAL ENTRY)
Bilirubin, UA: NEGATIVE
GLUCOSE UA: NEGATIVE mg/dL
Ketones, POC UA: NEGATIVE mg/dL
Nitrite, UA: NEGATIVE
Protein Ur, POC: NEGATIVE mg/dL
RBC UA: NEGATIVE
SPEC GRAV UA: 1.01 (ref 1.010–1.025)
UROBILINOGEN UA: 0.2 U/dL
pH, UA: 5.5 (ref 5.0–8.0)

## 2016-11-27 NOTE — Progress Notes (Signed)
Peripheral IV started in left arm per Dr. Raul Del VO. Charges placed. No blood drawn.  Philis Fendt, MS, PA-C 8:58 AM, 11/27/2016

## 2016-11-27 NOTE — Progress Notes (Signed)
Subjective:    Patient ID: Valerie [name], female    DOB: Nov 01, 1960, 56 y.o.   MRN: [id number] No chief complaint on file.   HPI   Valerie [name] is a 56 year old woman  Past Medical History:  Diagnosis Date  . Acute ischemic colitis ([place])   . Anemia 05/01/2012  . Anxiety   . ARF (acute renal failure) ([place]) 12/28/2015  . Arthritis    "basically ~ q joint" (12/28/2015)  . Asthma   . Basal cell carcinoma of upper back ~ 2011  . DDD (degenerative disc disease), lumbar   . Diarrhea    EPISODIC  . Fibromyalgia   . First degree heart block   . Gastro-esophageal reflux   . Headache(784.0)    "muscular w/migraine intensity; ~ 5-6 months" (12/28/2015)  . Hypertension   . Mental disorder    reports PTSD  . Microscopic polyangiitis ([place])   . Nerve pain    nerve damage to right arm  . Neuromuscular disorder ([place])   . Pancreatitis   . Pneumonia ~ 2014; 08/2015  . Polymyalgia rheumatica ([place]) onset 08/2015   changed diagnosis to MPA  . Precancerous melanosis    "2nd toe left foot; 5th toe of right foot; right temple; had them all cut off"  . Sleep apnea    CPAP-9   Past Surgical History:  Procedure Laterality Date  . BASAL CELL CARCINOMA EXCISION  ~ 2011   back  . BOTOX INJECTION N/A 11/06/2012   Procedure: BOTOX INJECTION;  Surgeon: Missy Sabins, MD;  Location: [facility];  Service: Endoscopy;  Laterality: N/A;  . BOTOX INJECTION N/A 12/04/2013   Procedure: BOTOX INJECTION;  Surgeon: Missy Sabins, MD;  Location: [facility];  Service: Endoscopy;  Laterality: N/A;  . BOTOX INJECTION N/A 02/26/2014   Procedure: BOTOX INJECTION;  Surgeon: Missy Sabins, MD;  Location: [facility];  Service: Endoscopy;  Laterality: N/A;  . BOTOX INJECTION N/A 07/08/2015   Procedure: BOTOX INJECTION;  Surgeon: Teena Irani, MD;  Location: [facility];  Service: Endoscopy;  Laterality: N/A;  . BOTOX INJECTION N/A 04/27/2016   Procedure: BOTOX INJECTION;  Surgeon: Teena Irani, MD;  Location: [facility];  Service: Endoscopy;  Laterality: N/A;  . ESOPHAGOGASTRODUODENOSCOPY  12/21/2011   Procedure: ESOPHAGOGASTRODUODENOSCOPY (EGD);  Surgeon: Missy Sabins, MD;  Location[facility] ENDOSCOPY;  Service: Endoscopy;  Laterality: N/A;  . ESOPHAGOGASTRODUODENOSCOPY N/A 11/06/2012   Procedure: ESOPHAGOGASTRODUODENOSCOPY (EGD);  Surgeon: Missy Sabins, MD;  Location: [facility];  Service: Endoscopy;  Laterality: N/A;  . ESOPHAGOGASTRODUODENOSCOPY N/A 12/04/2013   Procedure: ESOPHAGOGASTRODUODENOSCOPY (EGD);  Surgeon: Missy Sabins, MD;  Location: [facility] ENDOSCOPY;  Service: Endoscopy;  Laterality: N/A;  . ESOPHAGOGASTRODUODENOSCOPY N/A 02/26/2014   Procedure: ESOPHAGOGASTRODUODENOSCOPY (EGD);  Surgeon: Missy Sabins, MD;  Location: [facility] ENDOSCOPY;  Service: Endoscopy;  Laterality: N/A;  . ESOPHAGOGASTRODUODENOSCOPY (EGD) WITH PROPOFOL N/A 07/08/2015   Procedure: ESOPHAGOGASTRODUODENOSCOPY (EGD) WITH PROPOFOL;  Surgeon: Teena Irani, MD;  Location: [facility];  Service: Endoscopy;  Laterality: N/A;  . ESOPHAGOGASTRODUODENOSCOPY (EGD) WITH PROPOFOL N/A 04/27/2016   Procedure: ESOPHAGOGASTRODUODENOSCOPY (EGD) WITH PROPOFOL;  Surgeon: Teena Irani, MD;  Location: [facility];  Service: Endoscopy;  Laterality: N/A;  . FLEXIBLE SIGMOIDOSCOPY  05/01/2012   Procedure: FLEXIBLE SIGMOIDOSCOPY;  Surgeon: Jeryl Columbia, MD;  Location: [facility];  Service: Endoscopy;  Laterality: N/A;  unprepped  . FLEXIBLE SIGMOIDOSCOPY Left 09/28/2016   Procedure: FLEXIBLE SIGMOIDOSCOPY;  Surgeon: Wilford Corner, MD;  Location: [facility] ENDOS[facility];  Service: Endoscopy;  Laterality: Left;  . LAPAROSCOPIC CHOLECYSTECTOMY  01/2002  . NASAL SEPTOPLASTY W/ TURBINOPLASTY  1998  . SHOULDER ARTHROSCOPY WITH ROTATOR CUFF REPAIR AND OPEN BICEPS TENODESIS Bilateral 2010-2013   left-right   Current Outpatient Prescriptions on File Prior to Visit  Medication Sig Dispense Refill  . allopurinol (ZYLOPRIM) 300 MG tablet Take 0.5 tablets (150 mg total) by mouth  daily. 90 tablet 1  . ALPRAZolam (XANAX) 1 MG tablet Take 1 tablet (1 mg total) by mouth 3 (three) times daily as needed for anxiety. 90 tablet 1  . benazepril (LOTENSIN) 40 MG tablet Take 1 tablet (40 mg total) by mouth daily. 90 tablet 3  . Biotin 5000 MCG CAPS Take 10,000 mcg by mouth daily.     . calcium carbonate (OSCAL) 1500 (600 Ca) MG TABS tablet Take 600 mg of elemental calcium by mouth daily with breakfast.    . cetirizine (ZYRTEC) 10 MG tablet Take 10 mg by mouth daily. Alternates between Allegra and Zyrtec as needed for allergies    . Cholecalciferol (VITAMIN D3) 5000 units CAPS Take 1 capsule by mouth.    . dexlansoprazole (DEXILANT) 60 MG capsule Take 60 mg by mouth 2 (two) times daily.    . fexofenadine (ALLEGRA) 180 MG tablet Take 180 mg by mouth daily as needed. Switches between Allegra and Zyrtec as needed for allergies    . fluticasone (FLONASE) 50 MCG/ACT nasal spray Place 2 sprays into both nostrils daily. 48 g 2  . folic acid (FOLVITE) 1 MG tablet Take 1 mg by mouth daily.    Marland Kitchen HYDROcodone-acetaminophen (NORCO) 10-325 MG per tablet Take 1-2 tablets by mouth every 6 (six) hours as needed for moderate pain (pain.).     Marland Kitchen hydrOXYzine (ATARAX/VISTARIL) 10 MG tablet Take 1-3 tablets (10-30 mg total) by mouth 3 (three) times daily as needed. 90 tablet 0  . metoprolol succinate (TOPROL-XL) 100 MG 24 hr tablet Take 1 tablet (100 mg total) by mouth daily. Take with or immediately following a meal. 90 tablet 3  . modafinil (PROVIGIL) 200 MG tablet Take 0.5 tablets (100 mg total) by mouth 2 (two) times daily. 90 tablet 1  . montelukast (SINGULAIR) 10 MG tablet TAKE 1 TABLET (10 MG TOTAL) BY MOUTH AT BEDTIME 90 tablet 3  . morphine (MS CONTIN) 30 MG 12 hr tablet Take 30 mg by mouth every 8 (eight) hours. Not from [place]    . Multiple Vitamins-Minerals (MULTIVITAMIN PO) Take 1 tablet by mouth daily.    . Olopatadine HCl (PAZEO) 0.7 % SOLN Apply 1 drop to eye daily. 2.5 mL 11  . ondansetron  (ZOFRAN) 4 MG tablet Take 1 tablet (4 mg total) by mouth every 8 (eight) hours as needed for nausea or vomiting. 20 tablet 0  . Pancrelipase, Lip-Prot-Amyl, (CREON) 24000 UNITS CPEP Take 72,000 Units by mouth 3 (three) times daily as needed (for pancreatitis).     . predniSONE (DELTASONE) 1 MG tablet Take 4 mg by mouth daily.     . predniSONE (DELTASONE) 20 MG tablet Take 3 tabs qd x 3d, then 2 tabs qd x 3d then 1 tab qd x 3d. 18 tablet 0  . PROAIR HFA 108 (90 Base) MCG/ACT inhaler INHALE 2 PUFFS INTO THE LUNGS AS NEEDED FOR WHEEZING OR SHORTNESS OF BREATH. 8.5 each 0  . promethazine (PHENERGAN) 25 MG tablet Take 1 tablet (25 mg total) by mouth every 6 (six) hours as needed for nausea or vomiting. 30 tablet 1  . spironolactone (ALDACTONE)  100 MG tablet Take 1 tablet (100 mg total) by mouth daily. 90 tablet 3  . sucralfate (CARAFATE) 1 GM/10ML suspension Take 10 mLs (1 g total) by mouth 4 (four) times daily as needed (stomach pain). 420 mL 1   No current facility-administered medications on file prior to visit.    Allergies  Allergen Reactions  . Dilaudid [Hydromorphone Hcl] Other (See Comments)    Headache   . Pregabalin Other (See Comments)    Drug induced stupor  . Metoclopramide Hcl Other (See Comments)    "crawling inside"  . Neurontin [Gabapentin] Other (See Comments)    Tremors   . Other Other (See Comments)    Avoid all SSRI's and SNRI's- damaged pancreas  . Serotonin Reuptake Inhibitors (Ssris) Other (See Comments)    Damage to pancreas and bowel/urinary incontinence    Family History  Problem Relation Age of Onset  . Heart disease Mother   . COPD Mother   . Arthritis Mother   . Cancer Mother   . Hypertension Mother   . Arthritis Paternal Grandmother   . Heart disease Paternal Grandmother   . Heart disease Paternal Grandfather    Social History   Social History  . Marital status: Single    Spouse name: N/A  . Number of children: 0  . Years of education: College    Occupational History  . disabled    Social History Main Topics  . Smoking status: Never Smoker  . Smokeless tobacco: Never Used  . Alcohol use No  . Drug use: No  . Sexual activity: Not Currently   Other Topics Concern  . None   Social History Narrative   Caffeine 2 cups iced tea daily avg.   Depression screen [facility] 2/9 11/25/2016 11/17/2016 10/23/2016 10/09/2016 09/18/2016  Decreased Interest 1 2 0 0 0  Down, Depressed, Hopeless 1 2 0 0 0  PHQ - 2 Score 2 4 0 0 0  Altered sleeping 0 2 - - -  Tired, decreased energy 0 2 - - -  Change in appetite 0 2 - - -  Feeling bad or failure about yourself  0 1 - - -  Trouble concentrating 0 2 - - -  Moving slowly or fidgety/restless 0 1 - - -  Suicidal thoughts 0 0 - - -  PHQ-9 Score 2 14 - - -  Difficult doing work/chores Somewhat difficult Very difficult - - -  Some recent data might be hidden     Review of Systems See hpi    Objective:   Physical Exam  Constitutional: Valerie [name] is oriented to person, place, and time. Valerie [name] appears well-developed and well-nourished. No distress.  HENT:  Head: Normocephalic and atraumatic.  Right Ear: External ear normal.  Left Ear: External ear normal.  Eyes: Conjunctivae are normal. No scleral icterus.  Neck: Normal range of motion. Neck supple. No thyromegaly present.  Cardiovascular: Normal rate, regular rhythm, normal heart sounds and intact distal pulses.   Pulmonary/Chest: Effort normal and breath sounds normal. No respiratory distress.  Musculoskeletal: Valerie [name] exhibits no edema.  Lymphadenopathy:    Valerie [name] has no cervical adenopathy.  Neurological: Valerie [name] is alert and oriented to person, place, and time.  Skin: Skin is warm and dry. Valerie [name] is not diaphoretic. No erythema.  Psychiatric: Valerie [name] has a normal mood and affect. Her behavior is normal.   BP 108/71 (BP Location: Right Arm, Patient Position: Sitting, Cuff Size: Large)   Pulse (!) 57   Resp 16  Ht 5\' 4"  (1.626 m)   Wt 286 lb (129.7 kg)   SpO2 99%    BMI 49.09 kg/m   Results for orders placed or performed in visit on 11/27/16  POCT urinalysis dipstick  Result Value Ref Range   Color, UA yellow yellow   Clarity, UA clear clear   Glucose, UA negative negative mg/dL   Bilirubin, UA negative negative   Ketones, POC UA negative negative mg/dL   Spec Grav, UA 1.010 1.010 - 1.025   Blood, UA negative negative   pH, UA 5.5 5.0 - 8.0   Protein Ur, POC negative negative mg/dL   Urobilinogen, UA 0.2 0.2 or 1.0 E.U./dL   Nitrite, UA Negative Negative   Leukocytes, UA Trace (A) Negative    Assessment & Plan:   1. Dehydration - suspect due to recent diarrhea from IBS and nausea and vomiting from stress. However patient disagrees and is concerned there is something else wrong that is driving her symptoms so we are doing further workup. Given 2 L of IV fluid in the office after which Valerie [name] was able to provide a urine sample showed the proteinuria had resolved. repeat BMP drawn after the 2 L of IV fluid showed significant improvement in creatinine though BUN some mildly elevated.  2. Acute renal failure superimposed on stage 3 chronic kidney disease, unspecified acute renal failure type ([place]) - Has follow-up appointment with Dr. Joelyn Oms in several weeks.   3. Proteinuria, unspecified type - has been isolated in past when dehydrated and resolved with rehydration.   4. Diarrhea, unspecified type - suspect due to IBS-D but has had a recent hospitalization and antibiotics and need to rule out C. difficile and other infections. Patient unable to provide sample today but has supplies at home to collect and return   5. Microscopic polyangiitis ([place]) - patient reports Valerie [name] is feeling better with the prednisone taper   6. Irritable bowel syndrome with diarrhea   7. P-ANCA and MPO antibodies positive   8. Nausea without vomiting - resolving, patient now tolerating po.   9. Hyperkalemia - Worsened. Suspect hemolysis. Will have patient come back in for recheck  first thing tomorrow.      Orders Placed This Encounter  Procedures  . Basic metabolic panel    Order Specific Question:   Has the patient fasted?    Answer:   No  . Basic metabolic panel    Standing Status:   Future    Standing Expiration Date:   11/28/2017    Order Specific Question:   Has the patient fasted?    Answer:   No  . Care order/instruction:    Scheduling Instructions:     Recheck BP - please get full vitals  . POCT urinalysis dipstick      Delman Cheadle, M.D.  Primary Care at [facility] [address] [place], [place] [address] [phone] phone [phone] fax  11/28/16 5:20 PM

## 2016-11-28 ENCOUNTER — Encounter: Payer: Self-pay | Admitting: Family Medicine

## 2016-11-28 DIAGNOSIS — F4310 Post-traumatic stress disorder, unspecified: Secondary | ICD-10-CM | POA: Diagnosis not present

## 2016-11-28 DIAGNOSIS — M79605 Pain in left leg: Secondary | ICD-10-CM | POA: Diagnosis not present

## 2016-11-28 DIAGNOSIS — M317 Microscopic polyangiitis: Secondary | ICD-10-CM | POA: Diagnosis not present

## 2016-11-28 DIAGNOSIS — Z7952 Long term (current) use of systemic steroids: Secondary | ICD-10-CM | POA: Diagnosis not present

## 2016-11-28 DIAGNOSIS — K219 Gastro-esophageal reflux disease without esophagitis: Secondary | ICD-10-CM | POA: Diagnosis not present

## 2016-11-28 DIAGNOSIS — M545 Low back pain: Secondary | ICD-10-CM | POA: Diagnosis not present

## 2016-11-28 DIAGNOSIS — K30 Functional dyspepsia: Secondary | ICD-10-CM | POA: Diagnosis not present

## 2016-11-28 DIAGNOSIS — F329 Major depressive disorder, single episode, unspecified: Secondary | ICD-10-CM | POA: Diagnosis not present

## 2016-11-28 DIAGNOSIS — Z8719 Personal history of other diseases of the digestive system: Secondary | ICD-10-CM | POA: Diagnosis not present

## 2016-11-28 DIAGNOSIS — E669 Obesity, unspecified: Secondary | ICD-10-CM | POA: Diagnosis not present

## 2016-11-28 DIAGNOSIS — M461 Sacroiliitis, not elsewhere classified: Secondary | ICD-10-CM | POA: Diagnosis not present

## 2016-11-28 DIAGNOSIS — G4733 Obstructive sleep apnea (adult) (pediatric): Secondary | ICD-10-CM | POA: Diagnosis not present

## 2016-11-28 DIAGNOSIS — Z87448 Personal history of other diseases of urinary system: Secondary | ICD-10-CM | POA: Diagnosis not present

## 2016-11-28 DIAGNOSIS — M79604 Pain in right leg: Secondary | ICD-10-CM | POA: Diagnosis not present

## 2016-11-28 DIAGNOSIS — Z79891 Long term (current) use of opiate analgesic: Secondary | ICD-10-CM | POA: Diagnosis not present

## 2016-11-28 DIAGNOSIS — M79601 Pain in right arm: Secondary | ICD-10-CM | POA: Diagnosis not present

## 2016-11-28 LAB — BASIC METABOLIC PANEL
BUN / CREAT RATIO: 25 — AB (ref 9–23)
BUN: 29 mg/dL — AB (ref 6–24)
CO2: 21 mmol/L (ref 20–29)
CREATININE: 1.16 mg/dL — AB (ref 0.57–1.00)
Calcium: 9.5 mg/dL (ref 8.7–10.2)
Chloride: 106 mmol/L (ref 96–106)
GFR calc non Af Amer: 54 mL/min/{1.73_m2} — ABNORMAL LOW (ref >59)
GFR, EST AFRICAN AMERICAN: 62 mL/min/{1.73_m2} (ref >59)
Glucose: 163 mg/dL — ABNORMAL HIGH (ref 65–99)
Potassium: 6.4 mmol/L — ABNORMAL HIGH (ref 3.5–5.2)
Sodium: 140 mmol/L (ref 134–144)

## 2016-11-29 ENCOUNTER — Other Ambulatory Visit: Payer: Medicare Other | Admitting: Physician Assistant

## 2016-11-29 ENCOUNTER — Encounter: Payer: Self-pay | Admitting: Family Medicine

## 2016-11-29 DIAGNOSIS — N179 Acute kidney failure, unspecified: Secondary | ICD-10-CM

## 2016-11-29 DIAGNOSIS — E860 Dehydration: Secondary | ICD-10-CM | POA: Diagnosis not present

## 2016-11-29 DIAGNOSIS — N183 Chronic kidney disease, stage 3 (moderate): Secondary | ICD-10-CM

## 2016-11-29 DIAGNOSIS — E875 Hyperkalemia: Secondary | ICD-10-CM | POA: Diagnosis not present

## 2016-11-30 LAB — BASIC METABOLIC PANEL
BUN / CREAT RATIO: 17 (ref 9–23)
BUN: 19 mg/dL (ref 6–24)
CHLORIDE: 109 mmol/L — AB (ref 96–106)
CO2: 19 mmol/L — ABNORMAL LOW (ref 20–29)
Calcium: 9.4 mg/dL (ref 8.7–10.2)
Creatinine, Ser: 1.15 mg/dL — ABNORMAL HIGH (ref 0.57–1.00)
GFR calc Af Amer: 62 mL/min/{1.73_m2} (ref >59)
GFR calc non Af Amer: 54 mL/min/{1.73_m2} — ABNORMAL LOW (ref >59)
GLUCOSE: 108 mg/dL — AB (ref 65–99)
POTASSIUM: 4.6 mmol/L (ref 3.5–5.2)
Sodium: 143 mmol/L (ref 134–144)

## 2016-12-01 ENCOUNTER — Encounter: Payer: Self-pay | Admitting: Family Medicine

## 2016-12-01 ENCOUNTER — Encounter: Payer: Self-pay | Admitting: Physician Assistant

## 2016-12-01 ENCOUNTER — Telehealth: Payer: Self-pay | Admitting: Family Medicine

## 2016-12-01 ENCOUNTER — Telehealth: Payer: Self-pay | Admitting: Emergency Medicine

## 2016-12-01 NOTE — Telephone Encounter (Signed)
Pt called in very upset she did not receive Potassium results from Wednesday.  Apologized and reassured results are back to normal

## 2016-12-01 NOTE — Telephone Encounter (Signed)
PT IS VERY VERY UPSET AND CRYING SHE HAS LEFT PLENTY OF MESSAGES FOR SHAW TO TELL HER ABOUT HER LABS STATING THAT SHE KNOW THAT ITS IMPORTANT THAT THEY FIND OUT WHAT'S WRONG WITH HER SHE ALSO WANTED MICHAEL CLARK TO GIVE HER A CALL ABOUT LABS SHE STATED THAT DR SHAW ASKED HER TO TALK TO MICHAEL IF SHE WASN'T AROUND

## 2016-12-01 NOTE — Telephone Encounter (Signed)
Valerie [name] did you talk to this patient ?

## 2016-12-05 ENCOUNTER — Telehealth: Payer: Self-pay | Admitting: Family Medicine

## 2016-12-05 NOTE — Telephone Encounter (Signed)
Please advise 

## 2016-12-05 NOTE — Telephone Encounter (Signed)
Pt is needing to know from Dr. Brigitte Pulse when to come back in for blood work check her next appt with her isnt until aug   Best number [phone]

## 2016-12-06 NOTE — Telephone Encounter (Signed)
Spoke with patient and advised that August was okay for next blood work. Asked pt when her nephrologist appointment was and she stated that it was tomorrow morning. Pt stated that she is still feeling bad, the nausea is always there even when she doesn't eat and she has been taking the nausea medication more than once a day and she doesn't like to but it is needed at times. Patient stated that she would like a refill on her xanax and hydroxyzine. Please advise.

## 2016-12-06 NOTE — Telephone Encounter (Signed)
August is fine. Her labs were all at her baseline and she has an appt with nephrology sched as well as the second opinion at [place] and I'm sure both of those physicians will want to do lab work. Of course, if she begins feeling poorly and/or has a recurrence of GI sxs, come back in sooner.

## 2016-12-07 ENCOUNTER — Encounter: Payer: Self-pay | Admitting: Physician Assistant

## 2016-12-07 ENCOUNTER — Encounter: Payer: Self-pay | Admitting: Family Medicine

## 2016-12-07 DIAGNOSIS — I10 Essential (primary) hypertension: Secondary | ICD-10-CM | POA: Diagnosis not present

## 2016-12-07 DIAGNOSIS — Z87828 Personal history of other (healed) physical injury and trauma: Secondary | ICD-10-CM | POA: Diagnosis not present

## 2016-12-07 DIAGNOSIS — R768 Other specified abnormal immunological findings in serum: Secondary | ICD-10-CM | POA: Diagnosis not present

## 2016-12-08 ENCOUNTER — Ambulatory Visit (INDEPENDENT_AMBULATORY_CARE_PROVIDER_SITE_OTHER): Payer: Medicare Other | Admitting: Family Medicine

## 2016-12-08 ENCOUNTER — Encounter: Payer: Self-pay | Admitting: Family Medicine

## 2016-12-08 ENCOUNTER — Telehealth: Payer: Self-pay | Admitting: Family Medicine

## 2016-12-08 VITALS — BP 136/89 | HR 90 | Temp 98.1°F | Resp 16 | Ht 64.0 in | Wt 291.0 lb

## 2016-12-08 DIAGNOSIS — Z7952 Long term (current) use of systemic steroids: Secondary | ICD-10-CM

## 2016-12-08 DIAGNOSIS — T380X5A Adverse effect of glucocorticoids and synthetic analogues, initial encounter: Secondary | ICD-10-CM

## 2016-12-08 DIAGNOSIS — M317 Microscopic polyangiitis: Secondary | ICD-10-CM

## 2016-12-08 DIAGNOSIS — R110 Nausea: Secondary | ICD-10-CM

## 2016-12-08 DIAGNOSIS — R768 Other specified abnormal immunological findings in serum: Secondary | ICD-10-CM

## 2016-12-08 DIAGNOSIS — R5383 Other fatigue: Secondary | ICD-10-CM

## 2016-12-08 DIAGNOSIS — F411 Generalized anxiety disorder: Secondary | ICD-10-CM | POA: Diagnosis not present

## 2016-12-08 DIAGNOSIS — R7989 Other specified abnormal findings of blood chemistry: Secondary | ICD-10-CM

## 2016-12-08 DIAGNOSIS — K580 Irritable bowel syndrome with diarrhea: Secondary | ICD-10-CM

## 2016-12-08 DIAGNOSIS — R5382 Chronic fatigue, unspecified: Secondary | ICD-10-CM | POA: Diagnosis not present

## 2016-12-08 DIAGNOSIS — R7689 Other specified abnormal immunological findings in serum: Secondary | ICD-10-CM

## 2016-12-08 DIAGNOSIS — E2740 Unspecified adrenocortical insufficiency: Secondary | ICD-10-CM

## 2016-12-08 DIAGNOSIS — Z8639 Personal history of other endocrine, nutritional and metabolic disease: Secondary | ICD-10-CM

## 2016-12-08 DIAGNOSIS — E875 Hyperkalemia: Secondary | ICD-10-CM | POA: Diagnosis not present

## 2016-12-08 DIAGNOSIS — R194 Change in bowel habit: Secondary | ICD-10-CM | POA: Diagnosis not present

## 2016-12-08 DIAGNOSIS — F4320 Adjustment disorder, unspecified: Secondary | ICD-10-CM | POA: Diagnosis not present

## 2016-12-08 DIAGNOSIS — R7303 Prediabetes: Secondary | ICD-10-CM

## 2016-12-08 DIAGNOSIS — K529 Noninfective gastroenteritis and colitis, unspecified: Secondary | ICD-10-CM

## 2016-12-08 DIAGNOSIS — G4701 Insomnia due to medical condition: Secondary | ICD-10-CM | POA: Diagnosis not present

## 2016-12-08 DIAGNOSIS — E099 Drug or chemical induced diabetes mellitus without complications: Secondary | ICD-10-CM

## 2016-12-08 DIAGNOSIS — Z9225 Personal history of immunosupression therapy: Secondary | ICD-10-CM | POA: Diagnosis not present

## 2016-12-08 DIAGNOSIS — R809 Proteinuria, unspecified: Secondary | ICD-10-CM

## 2016-12-08 DIAGNOSIS — E66813 Obesity, class 3: Secondary | ICD-10-CM

## 2016-12-08 DIAGNOSIS — F4321 Adjustment disorder with depressed mood: Secondary | ICD-10-CM

## 2016-12-08 DIAGNOSIS — Z6841 Body Mass Index (BMI) 40.0 and over, adult: Secondary | ICD-10-CM

## 2016-12-08 LAB — POCT URINALYSIS DIP (MANUAL ENTRY)
Bilirubin, UA: NEGATIVE
Glucose, UA: NEGATIVE mg/dL
Ketones, POC UA: NEGATIVE mg/dL
Leukocytes, UA: NEGATIVE
Nitrite, UA: NEGATIVE
PROTEIN UA: NEGATIVE mg/dL
RBC UA: NEGATIVE
SPEC GRAV UA: 1.015 (ref 1.010–1.025)
UROBILINOGEN UA: 0.2 U/dL
pH, UA: 5.5 (ref 5.0–8.0)

## 2016-12-08 LAB — POC MICROSCOPIC URINALYSIS (UMFC): Mucus: ABSENT

## 2016-12-08 MED ORDER — ALPRAZOLAM 1 MG PO TABS: 1.0000 mg | ORAL_TABLET | Freq: Three times a day (TID) | ORAL | 1 refills | Status: DC | PRN

## 2016-12-08 MED ORDER — HYDROXYZINE HCL 10 MG PO TABS: 10.0000 mg | ORAL_TABLET | Freq: Three times a day (TID) | ORAL | 1 refills | Status: DC | PRN

## 2016-12-08 NOTE — Telephone Encounter (Signed)
Follow Dr. Mack Guise advice. Leave the toprol at 100 for now.

## 2016-12-08 NOTE — Telephone Encounter (Signed)
Spoke with patient and gave her Dr. Raul Del instructions. Patient stated that she would call if she needs to come in sooner.

## 2016-12-08 NOTE — Telephone Encounter (Signed)
Please advise 

## 2016-12-08 NOTE — Telephone Encounter (Signed)
Leave toprol alone for now at 100mg  and follow Dr. Mack Guise plan.

## 2016-12-08 NOTE — Patient Instructions (Addendum)
I recommend doing a miralax clean-out. You can put up to 14 (not kidding) doses of miralax (polyethylene glycol) into 64 oz of any clear, non-carbonated liquid (apple juice, gatorade, water) and drink this WITHIN 24 HOURS!!!! For the next 2d, plan to stay home and just hang around the toilet as you will hopefully be having 8 BM/day of LIQUID stool.  If the diarrhea occurs soon after starting process without passing a significant stool volume or if you are having any fecal incontinence you should keep going as this may be the initial miralax washing around the larger stools.   Consider doing a low dose trial of 10mg  of amitryptiline ( a tricyclic anti-depressent at night to help sleep, mood, dreams, and chronic pain) every night before bed.  Cut your Toprol XL in half - you can double it up again if you have palpitations.  Try doubling your provigil to see if it will help   We could consider higher dose prednisone with a longer taper - a la polymyalgia rheumatica but I am concerned about the prednisone side effects on your bowels.  Let Dr. Amedeo Plenty know about your continuing nausea.  Consider an elimination diet with either dairy or gluten for a week.  Also IBS has a weird elimination diet called FODMAPs based upon certain types of sugars - connects foods you would never think of.  If no cause can be found for your GI symptoms, this would be a good next step.  Your insurance will only pay for 1 vitamin B12 a year and in [place] it was last done 12/28/15 so we will check at your next visit in August. Please make sure you have not had one drawn in [place] in the past year.   IF you received an x-ray today, you will receive an invoice from [facility]. Please contact [facility] at [phone] with questions or concerns regarding your invoice.   IF you received labwork today, you will receive an invoice from [facility]. Please contact [facility] at [phone] with questions or concerns  regarding your invoice.   Our billing staff will not be able to assist you with questions regarding bills from these companies.  You will be contacted with the lab results as soon as they are available. The fastest way to get your results is to activate your My Chart account. Instructions are located on the last page of this paperwork. If you have not heard from [place] regarding the results in 2 weeks, please contact this office.

## 2016-12-08 NOTE — Progress Notes (Addendum)
By signing my name below, I, Mesha Guinyard, attest that this documentation has been prepared under the direction and in the presence of Delman Cheadle, MD.  Electronically Signed: Verlee Monte, Medical Scribe. 12/08/16. 9:30 AM.  Subjective:    Patient ID: Valerie [name], female    DOB: [birth year], 56 y.o.   MRN: [id number]  HPI Chief Complaint  Patient presents with  . Follow-up    HPI Comments: Valerie [name] is a 56 y.o. female who presents to Primary Care at [facility] for follow-up. Ms. Brune has been dealing with possible autoimmune diease. Seen by rheumatologist, Dr. Graylin Shiver. Positive P-ANCA and MPO antibodies. She has been maintained on low dose prednisone for some time due to a variety of autoimmune complaints and some atypical labs, as well as some improvement while on prednisone. When she went off of prednisone initially, she had a severe episode of acute renal failure, she was restarted and a year later she had a colitis, both times required hospitalization. There was a concern her sxs improvement could be steroid dependent rather than vasculitis, so she was transitioned to methotrexate, which did show improvement. However shortly after she was on abx for sinusitis. When her mother passed away, pt was then started on another round of abx for tooth abscess. During these several weeks she developed vomiting and diarrhea, again, requiring hospitalization for acute renal failure, secondary to dehydration, during which methotrexate was stopped and she was restarted on prednisone 4 mg daily. Last saw Dr. Graylin Shiver on 6/14 and recently saw a nephrologist Dr. Inocente Salles, he recommended continued chronic treatment and follow-up in 3 months. Pt emailed stating that nephrologist does not think she has any systemic disease or vasculitis. Dr. Graylin Shiver referred her to [facility] rheumatology for a second opinion her apt is on 10/8. She request a second opinion at [facility] nephrology. She came in today feeling rotten because  she is extremely fatigue, no energy, she feels like her life is destroyed and she can no longer to the things she enjoys.  New Constipation: Pt hasn't used the bathroom since Sunday (5 days ago). Reports her "insides", bottom, and "sides" are still sore - didn't have diarrhea, or vomiting yesterday morning. Pt plans on getting miralax when she leaves this appt. She has tried a enema, eating fiber, and drinking fluids (water and gatorade) without relief of her sxs. Pt still has nausea but reports it went away with "the fluids". Her urine has a yellow tint in the morning (pt suspects it's from her vitamins), at her urologist is was a darker tint, and later in the day her urine is clear. Pt is drinking "everything she can", and she's not over eating and maintains a balanced diet. Pt followed up with Dr. Amedeo Plenty for her GI bleed. Pt has had bowel fluctuations from diarrhea to constipation ever since her gall bladder was removed in 2003. She notices when she's extremely stresses, she'll have diarrhea - had diarrhea when her grandmother died. Pt uses morphine in the morning, never during the day - denies any increase in narcotic use or change in med regimen.    Pt took promethazine BID yesterday, but she typically takes it QD. Her nausea occurs "once in a blue moon", onset since May 23rd (5-6 wks prior) when she was at [facility] with her mother who passed away that weekend.  Sxs began prior to her mother's passing which she feels like she is handling relatively well all considering. She feels fuller quicker - so early  satiety at times. Pt has not been able to produce a diarrhea stool sample since we gave her the specimen cup 2 wks prior (6/16) as bowel have normalized. Pt only has abdominal cramping during GI bleed 2/2 colitis sev mos ago. Denies bowel urgency, narcotics use, abdominal cramping.  Palpitations: Pt missed about 3-4 days of toprol and has felt palpitations while sitting down. From her mid 16s until 3  years ago, she had fluid retention. Now she doesn't have fluid retention and she's frequently dehydrated as well as constipated. Pt has been on metoprolol since 2004.   HTN: Pt's bp while at the pain clinic 2 wks ago was 152/100 (but was then 108/71 at our office the day prior). She took her medications 2 hours prior to her pain clinic visit and she wasn't stressed as she had had a relaxing scenic drive there.  Myalgias: Pt was put on prednisone 60mg  9d taper to try to improve sxs - finished 5d prior.  Pt did find some negligible relief of myalgias with prednisone 20 mg, high dose taper, but not enough to make it worth the side effects.  Main complaint is persistent lower leg pain which nothing has helped  Sleep Disturbace: Pt was having repeated "back to back" PTSD flashbacks last night and took hydroxyzine and xanax for relief of her sxs. Pt has ben using xanax and hydroxyzine the past few weeks due to worsening PTSD. Pt is still learning to cope by "removing herself from the situation" when her anxiety is "picking up". Pt's psychologist has been keeping in touch, she speaks with her minister, and speaks to hospice. Pt would like to rewind time to go back to the time when she was symptom free. Reports her mothers death is the trigger for her anxiety and sleep disturbance.  Fatigue: Pt has been on provigil 100mg  bid "for a long time". Pt doesn't always take the second 100mg  dose, but when she does she'll take the second dose in the afternoon. There was a time she took a whole 200mg  pill (cuts it in half always) and she was even more sleepy than usual - paradoxical reaction.   She also uses 1-2 xanax a day in the past mo - has been using prn for decades but has had to use more than prior since her mother's passing. Reports her fatigue become worse when she got off, and while she was on it, her fatigue didn't go away but she felt like doing things. Has had to start hydroxyzine since her mother's passing  last mo but mainly uses just at night to help sleep. Needs refill on xanax and hydroxyzine today.  Doesn't think he could have made it through the past mo without the help of these meds. Her mother's death relatively well considering how much she expected it to affect her. She has a supportive religious impairment and has been able talk with her pastor as well as a therapist and has an appointment with hospice to start grief counseling. She is very convinced that her grief has anything to do with her current GI, pain, and fatigue symptoms.  Patient Active Problem List   Diagnosis Date Noted  . Microscopic polyangiitis ([place]) 11/25/2016  . Grief reaction 11/19/2016  . P-ANCA and MPO antibodies positive 10/11/2016  . Dehydration 09/25/2016  . Impaired glucose tolerance 09/25/2016  . Lower GI bleed   . Colitis 09/24/2016  . Colitis, regional, with rectal bleeding ([place]) 09/24/2016  . Gastroparesis 09/18/2016  . Flank pain  09/18/2016  . Nausea without vomiting 09/18/2016  . CKD (chronic kidney disease) stage 3, GFR 30-59 ml/min   . Neoplasm of uncertain behavior of skin 02/03/2014  . Obesity, morbid ([place]) 10/21/2012  . OSA on CPAP 10/21/2012  . Fibromyalgia 06/22/2012  . Back pain 06/22/2012  . Recurrent pancreatitis ([place]) 06/22/2012  . Anemia 05/01/2012  . Leukocytosis 04/28/2012  . Colon polyp 08/01/2011  . PTSD (post-traumatic stress disorder) 08/01/2011  . Hypertension 08/01/2011  . IBS (irritable bowel syndrome) 08/01/2011   Past Medical History:  Diagnosis Date  . Acute ischemic colitis ([place])   . Anemia 05/01/2012  . Anxiety   . ARF (acute renal failure) ([place]) 12/28/2015  . Arthritis    "basically ~ q joint" (12/28/2015)  . Asthma   . Basal cell carcinoma of upper back ~ 2011  . DDD (degenerative disc disease), lumbar   . Diarrhea    EPISODIC  . Fibromyalgia   . First degree heart block   . Gastro-esophageal reflux   . Headache(784.0)    "muscular w/migraine intensity; ~  5-6 months" (12/28/2015)  . Hypertension   . Mental disorder    reports PTSD  . Microscopic polyangiitis ([place])   . Nerve pain    nerve damage to right arm  . Neuromuscular disorder ([place])   . Pancreatitis   . Pneumonia ~ 2014; 08/2015  . Polymyalgia rheumatica ([place]) onset 08/2015   changed diagnosis to MPA  . Precancerous melanosis    "2nd toe left foot; 5th toe of right foot; right temple; had them all cut off"  . Sleep apnea    CPAP-9   Past Surgical History:  Procedure Laterality Date  . BASAL CELL CARCINOMA EXCISION  ~ 2011   back  . BOTOX INJECTION N/A 11/06/2012   Procedure: BOTOX INJECTION;  Surgeon: Missy Sabins, MD;  Location: [facility];  Service: Endoscopy;  Laterality: N/A;  . BOTOX INJECTION N/A 12/04/2013   Procedure: BOTOX INJECTION;  Surgeon: Missy Sabins, MD;  Location: [place];  Service: Endoscopy;  Laterality: N/A;  . BOTOX INJECTION N/A 02/26/2014   Procedure: BOTOX INJECTION;  Surgeon: Missy Sabins, MD;  Location: [place];  Service: Endoscopy;  Laterality: N/A;  . BOTOX INJECTION N/A 07/08/2015   Procedure: BOTOX INJECTION;  Surgeon: Teena Irani, MD;  Location: [facility];  Service: Endoscopy;  Laterality: N/A;  . BOTOX INJECTION N/A 04/27/2016   Procedure: BOTOX INJECTION;  Surgeon: Teena Irani, MD;  Location: [facility];  Service: Endoscopy;  Laterality: N/A;  . ESOPHAGOGASTRODUODENOSCOPY  12/21/2011   Procedure: ESOPHAGOGASTRODUODENOSCOPY (EGD);  Surgeon: Missy Sabins, MD;  Location[facility] END[facility]COPY;  Service: Endoscopy;  Laterality: N/A;  . ESOPHAGOGASTRODUODENOSCOPY N/A 11/06/2012   Procedure: ESOPHAGOGASTRODUODENOSCOPY (EGD);  Surgeon: Missy Sabins, MD;  Location: [facility];  Service: Endoscopy;  Laterality: N/A;  . ESOPHAGOGASTRODUODENOSCOPY N/A 12/04/2013   Procedure: ESOPHAGOGASTRODUODENOSCOPY (EGD);  Surgeon: Missy Sabins, MD;  Location: [facility] ENDOSCOPY;  Service: Endoscopy;  Laterality: N/A;  . ESOPHAGOGASTRODUODENOSCOPY N/A 02/26/2014   Procedure:  ESOPHAGOGASTRODUODENOSCOPY (EGD);  Surgeon: Missy Sabins, MD;  Location: [facility] ENDOSCOPY;  Service: Endoscopy;  Laterality: N/A;  . ESOPHAGOGASTRODUODENOSCOPY (EGD) WITH PROPOFOL N/A 07/08/2015   Procedure: ESOPHAGOGASTRODUODENOSCOPY (EGD) WITH PROPOFOL;  Surgeon: Teena Irani, MD;  [facility];  Service: Endoscopy;  Laterality: N/A;  . ESOPHAGOGASTRODUODENOSCOPY (EGD) WITH PROPOFOL N/A 04/27/2016   Procedure: ESOPHAGOGASTRODUODENOSCOPY (EGD) WITH PROPOFOL;  Surgeon: Teena Irani, MD;  Location: [facility];  Service: Endoscopy;  Laterality: N/A;  . FLEXIBLE  SIGMOIDOSCOPY  05/01/2012   Procedure: FLEXIBLE SIGMOIDOSCOPY;  Surgeon: Jeryl Columbia, MD;  Location: [facility];  Service: Endoscopy;  Laterality: N/A;  unprepped  . FLEXIBLE SIGMOIDOSCOPY Left 09/28/2016   Procedure: FLEXIBLE SIGMOIDOSCOPY;  Surgeon: Wilford Corner, MD;  Location: [facility];  Service: Endoscopy;  Laterality: Left;  . LAPAROSCOPIC CHOLECYSTECTOMY  01/2002  . NASAL SEPTOPLASTY W/ TURBINOPLASTY  1998  . SHOULDER ARTHROSCOPY WITH ROTATOR CUFF REPAIR AND OPEN BICEPS TENODESIS Bilateral 2010-2013   left-right   Allergies  Allergen Reactions  . Dilaudid [Hydromorphone Hcl] Other (See Comments)    Headache   . Pregabalin Other (See Comments)    Drug induced stupor  . Metoclopramide Hcl Other (See Comments)    "crawling inside"  . Neurontin [Gabapentin] Other (See Comments)    Tremors   . Other Other (See Comments)    Avoid all SSRI's and SNRI's- damaged pancreas  . Serotonin Reuptake Inhibitors (Ssris) Other (See Comments)    Damage to pancreas and bowel/urinary incontinence    Prior to Admission medications   Medication Sig Start Date End Date Taking? Authorizing Provider  allopurinol (ZYLOPRIM) 300 MG tablet Take 0.5 tablets (150 mg total) by mouth daily. 07/31/16  Yes Shawnee Knapp, MD  ALPRAZolam Duanne Moron) 1 MG tablet Take 1 tablet (1 mg total) by mouth 3 (three) times daily as needed for anxiety. 07/31/16  Yes  Shawnee Knapp, MD  benazepril (LOTENSIN) 40 MG tablet Take 1 tablet (40 mg total) by mouth daily. 02/18/16  Yes Darlyne Russian, MD  Biotin 5000 MCG CAPS Take 10,000 mcg by mouth daily.    Yes [provider]  calcium carbonate (OSCAL) 1500 (600 Ca) MG TABS tablet Take 600 mg of elemental calcium by mouth daily with breakfast.   Yes [provider]  cetirizine (ZYRTEC) 10 MG tablet Take 10 mg by mouth daily. Alternates between Allegra and Zyrtec as needed for allergies   Yes [provider]  Cholecalciferol (VITAMIN D3) 5000 units CAPS Take 1 capsule by mouth.   Yes [provider]  dexlansoprazole (DEXILANT) 60 MG capsule Take 60 mg by mouth 2 (two) times daily.   Yes [provider]  fexofenadine (ALLEGRA) 180 MG tablet Take 180 mg by mouth daily as needed. Switches between Allegra and Zyrtec as needed for allergies   Yes [provider]  fluticasone (FLONASE) 50 MCG/ACT nasal spray Place 2 sprays into both nostrils daily. 07/31/16  Yes Shawnee Knapp, MD  folic acid (FOLVITE) 1 MG tablet Take 1 mg by mouth daily. 10/10/16  Yes [provider]  HYDROcodone-acetaminophen (NORCO) 10-325 MG per tablet Take 1-2 tablets by mouth every 6 (six) hours as needed for moderate pain (pain.).    Yes [provider]  hydrOXYzine (ATARAX/VISTARIL) 10 MG tablet Take 1-3 tablets (10-30 mg total) by mouth 3 (three) times daily as needed. 11/04/16  Yes Tereasa Coop, PA-C  metoprolol succinate (TOPROL-XL) 100 MG 24 hr tablet Take 1 tablet (100 mg total) by mouth daily. Take with or immediately following a meal. 07/31/16  Yes Shawnee Knapp, MD  modafinil (PROVIGIL) 200 MG tablet Take 0.5 tablets (100 mg total) by mouth 2 (two) times daily. 10/11/16  Yes Dohmeier, Asencion Partridge, MD  montelukast (SINGULAIR) 10 MG tablet TAKE 1 TABLET (10 MG TOTAL) BY MOUTH AT BEDTIME 07/31/16  Yes Shawnee Knapp, MD  morphine (MS CONTIN) 30 MG 12 hr tablet Take 30 mg by mouth every 8 (eight)  hours. Not from  [facility] 09/23/12  Yes [provider]  Multiple Vitamins-Minerals (MULTIVITAMIN PO) Take 1 tablet by mouth daily.   Yes [provider]  Olopatadine HCl (PAZEO) 0.7 % SOLN Apply 1 drop to eye daily. 08/02/16  Yes Shawnee Knapp, MD  ondansetron (ZOFRAN) 4 MG tablet Take 1 tablet (4 mg total) by mouth every 8 (eight) hours as needed for nausea or vomiting. 11/18/16  Yes Tereasa Coop, PA-C  Pancrelipase, Lip-Prot-Amyl, (CREON) 24000 UNITS CPEP Take 72,000 Units by mouth 3 (three) times daily as needed (for pancreatitis).    Yes [provider]  predniSONE (DELTASONE) 1 MG tablet Take 4 mg by mouth daily.    Yes [provider]  PROAIR HFA 108 (90 Base) MCG/ACT inhaler INHALE 2 PUFFS INTO THE LUNGS AS NEEDED FOR WHEEZING OR SHORTNESS OF BREATH. 05/29/16  Yes Shawnee Knapp, MD  promethazine (PHENERGAN) 25 MG tablet Take 1 tablet (25 mg total) by mouth every 6 (six) hours as needed for nausea or vomiting. 11/18/16  Yes Tereasa Coop, PA-C  spironolactone (ALDACTONE) 100 MG tablet Take 1 tablet (100 mg total) by mouth daily. 07/31/16  Yes Shawnee Knapp, MD  sucralfate (CARAFATE) 1 GM/10ML suspension Take 10 mLs (1 g total) by mouth 4 (four) times daily as needed (stomach pain). 07/31/16  Yes Shawnee Knapp, MD  predniSONE (DELTASONE) 20 MG tablet Take 3 tabs qd x 3d, then 2 tabs qd x 3d then 1 tab qd x 3d. Patient not taking: Reported on 12/08/2016 11/25/16   Shawnee Knapp, MD   Social History   Social History  . Marital status: Single    Spouse name: N/A  . Number of children: 0  . Years of education: College   Occupational History  . disabled    Social History Main Topics  . Smoking status: Never Smoker  . Smokeless tobacco: Never Used  . Alcohol use No  . Drug use: No  . Sexual activity: Not Currently   Other Topics Concern  . Not on file   Social History Narrative   Caffeine 2 cups iced tea daily avg.   Depression screen [facility] 2/9 11/25/2016 11/17/2016  10/23/2016 10/09/2016 09/18/2016  Decreased Interest 1 2 0 0 0  Down, Depressed, Hopeless 1 2 0 0 0  PHQ - 2 Score 2 4 0 0 0  Altered sleeping 0 2 - - -  Tired, decreased energy 0 2 - - -  Change in appetite 0 2 - - -  Feeling bad or failure about yourself  0 1 - - -  Trouble concentrating 0 2 - - -  Moving slowly or fidgety/restless 0 1 - - -  Suicidal thoughts 0 0 - - -  PHQ-9 Score 2 14 - - -  Difficult doing work/chores Somewhat difficult Very difficult - - -  Some recent data might be hidden    Review of Systems  Constitutional: Positive for fatigue.  Cardiovascular: Positive for palpitations. Negative for leg swelling.  Gastrointestinal: Positive for constipation, diarrhea, nausea and rectal pain. Negative for abdominal pain.  Psychiatric/Behavioral: Positive for sleep disturbance. The patient is nervous/anxious.    Objective:  Physical Exam  Constitutional: She appears well-developed and well-nourished. No distress.  HENT:  Head: Normocephalic and atraumatic.  Eyes: Conjunctivae are normal.  Neck: Neck supple.  Pulmonary/Chest: Effort normal.  Neurological: She is alert.  Skin: Skin is warm and dry.  Psychiatric: She has a normal mood and affect. Her behavior is  normal.  Nursing note and vitals reviewed.   Vitals:   12/08/16 0909  BP: 136/89  Pulse: 90  Resp: 16  Temp: 98.1 F (36.7 C)  TempSrc: Oral  SpO2: 96%  Weight: 291 lb (132 kg)  Height: 5\' 4"  (1.626 m)  Body mass index is 49.95 kg/m.   Results for orders placed or performed in visit on 12/08/16  POCT urinalysis dipstick  Result Value Ref Range   Color, UA yellow yellow   Clarity, UA clear clear   Glucose, UA negative negative mg/dL   Bilirubin, UA negative negative   Ketones, POC UA negative negative mg/dL   Spec Grav, UA 1.015 1.010 - 1.025   Blood, UA negative negative   pH, UA 5.5 5.0 - 8.0   Protein Ur, POC negative negative mg/dL   Urobilinogen, UA 0.2 0.2 or 1.0 E.U./dL   Nitrite, UA  Negative Negative   Leukocytes, UA Negative Negative  POCT Microscopic Urinalysis ([facility])  Result Value Ref Range   WBC,UR,HPF,POC None None WBC/hpf   RBC,UR,HPF,POC None None RBC/hpf   Bacteria None None, Too numerous to count   Mucus Absent Absent   Epithelial Cells, UR Per Microscopy Few (A) None, Too numerous to count cells/hpf   Assessment & Plan:  B12 will be approved wtih ICD10 and Z86.39 but only 1x per year and was last done on 12/28/2015.  1. P-ANCA and MPO antibodies positive   2. Nausea without vomiting  - Need to inform GI doctor about continuing symptoms   3. Proteinuria, unspecified type  - resolved. Saw Dr. Joelyn Oms at [facility] yesterday who does not see a possibility for any autoimmune/vasculitic component to her chronic kidney disease. Will proceed with second opinion referral to [facility] nephrology.  4. Change in bowel habits - new constipation - try miralax, high dose if needed. patient to call GI Dr. Amedeo Plenty at [place]. Encouraged patient to consider trial of elimination diet with lactose, gluten, or FODMAPs.   5. Irritable bowel syndrome with diarrhea  - This has been noted on patient's problem list prior but patient adamantly denies any history of consistent symptoms and does not agree with prior tentative diagnosis. Has followed closely with GI Dr. Amedeo Plenty at [place] for decades   6. Recurrent hyperkalemia - pt to hold spironolactone 100mg  per Dr. Joelyn Oms and had labs rechecked in 1 wk.  7. Fatigue, unspecified type - reviewed possibility of medications worsening including Toprol, Xanax, and hydroxyzine. Patient currently dependent on the anxiety meds and due to the loss of her mother last month. Initially advised trial of decreasing Toprol by half  - patient is on due to symptomatic palpitations and could increase back if those occur  - but will hold off for now since having to stop spironolactone  Consider repeat trial of Provigil 200 mg every morning at least once - 1 paradoxical  reaction many years ago makes patient reluctant.  8. Prediabetes -> now DM - a1c increased to 6.5 today, pt's diet is quite healthy but no activity and chronic prednisone dependence even at low dose likely contributing  9. Insomnia due to medical condition - declines trx, she expects to resolve as she cont to process her grief  10. Anxiety state   11. Grief   12. Personal history of immunosuppressive therapy   13. Colitis   14. Personal history of other endocrine, nutritional and metabolic disease   15.    Low serum cortisol level - level was drawn around 10:30 a.m. but is  2.9 mcg/dL with nml AM range of 6.2-19.4 and pm range 2.3-11.9. Do not know if pt is fasting. Will have pt repeat 8 am fasting labs with cortisol, ACTH (cons checking serum aldosterone/plasma renin levels? Will her coming off of spironolactone this wk affect those levels and for how long?) and will proceed with referral to endocrinology due to severity of fatigue, GI, musculoskeletal, and depression symptoms. Patient has been on prednisone 4 mg a day for many months - however, it looks like this is typically not thought to be a significant enough dose to cause HPA suppression/adrenal insufficiency (unless it is taken NIGHTLY). Patient was on a 60mg  9 day prednisone taper which she completed 4 days prior (60 x 3d, 40 x3d, 20 x3d) and did only minimally improve her adrenal insufficiency symptoms. Electrolytes normal other than high K which is likely due to spironolactone. No weight loss - BMI 50 19.     Myalgias - consider trial of elavil 10mg  qhs - pt reluctant due to h/o serotonin o/d sxs so now all ssri/snri are on allergy list. . .  .  Seems that symptoms are not as well controlled on prednisone 4 mg daily as they were prior to last hospitalization several months ago. Sxs also did not respond to the prednisone burst near as well as they did several years prior when she was diagnosed with PMR after she had really significant  improvement in symptoms after prednisone course.  Patient also with fibromyalgia and narcotic dependence. May need to consider bumping up daily prednisone dose if there are no other options.  Orders Placed This Encounter  Procedures  . Comprehensive metabolic panel  . Sedimentation Rate  . C-reactive protein  . CBC with Differential/Platelet  . Cortisol  . Thyroid Panel With TSH  . Thyroid antibodies  . Hemoglobin A1c  . Ambulatory referral to Nephrology    Referral Priority:   Routine    Referral Type:   Consultation    Referral Reason:   Specialty Services Required    Requested Specialty:   Nephrology    Number of Visits Requested:   1  . Care order/instruction:    AVS printed - let patient go!  Marland Kitchen POCT urinalysis dipstick  . POCT Microscopic Urinalysis ([facility])  . POC Hemoccult Bld/Stl (3-Cd Home Screen)    Standing Status:   Future    Standing Expiration Date:   12/08/2017    Meds ordered this encounter  Medications  . ALPRAZolam (XANAX) 1 MG tablet    Sig: Take 1 tablet (1 mg total) by mouth 3 (three) times daily as needed for anxiety.    Dispense:  90 tablet    Refill:  1  . hydrOXYzine (ATARAX/VISTARIL) 10 MG tablet    Sig: Take 1-3 tablets (10-30 mg total) by mouth 3 (three) times daily as needed.    Dispense:  90 tablet    Refill:  1   Over 40 min spent in face-to-face evaluation of and consultation with patient and coordination of care.  Over 50% of this time was spent counseling this patient.  I personally performed the services described in this documentation, which was scribed in my presence. The recorded information has been reviewed and considered, and addended by me as needed.   Delman Cheadle, M.D.  Primary Care at [facility] [address] [place], [place] [address] [phone] phone [phone] fax  12/10/16 12:13 AM

## 2016-12-08 NOTE — Telephone Encounter (Signed)
Pt saw Dr. Brigitte Pulse today and when she got home she had a message from dr Joelyn Oms stating that her potassium was 5.4 and creatine was 1.6 and to stop the spironolactone and repeat labs in a week, but when she saw Brigitte Pulse today she said to cut the toporal in half   Pt is wanting to know what to do   Best number [phone]

## 2016-12-09 LAB — THYROID ANTIBODIES: THYROID PEROXIDASE ANTIBODY: 54 [IU]/mL — AB (ref 0–34)

## 2016-12-09 LAB — CBC WITH DIFFERENTIAL/PLATELET
BASOS ABS: 0 10*3/uL (ref 0.0–0.2)
Basos: 0 %
EOS (ABSOLUTE): 0.1 10*3/uL (ref 0.0–0.4)
EOS: 2 %
HEMATOCRIT: 38.8 % (ref 34.0–46.6)
HEMOGLOBIN: 12.8 g/dL (ref 11.1–15.9)
IMMATURE GRANS (ABS): 0 10*3/uL (ref 0.0–0.1)
IMMATURE GRANULOCYTES: 0 %
LYMPHS ABS: 1.7 10*3/uL (ref 0.7–3.1)
LYMPHS: 25 %
MCH: 30.5 pg (ref 26.6–33.0)
MCHC: 33 g/dL (ref 31.5–35.7)
MCV: 92 fL (ref 79–97)
MONOCYTES: 9 %
Monocytes Absolute: 0.6 10*3/uL (ref 0.1–0.9)
Neutrophils Absolute: 4.4 10*3/uL (ref 1.4–7.0)
Neutrophils: 64 %
Platelets: 274 10*3/uL (ref 150–379)
RBC: 4.2 x10E6/uL (ref 3.77–5.28)
RDW: 15 % (ref 12.3–15.4)
WBC: 6.7 10*3/uL (ref 3.4–10.8)

## 2016-12-09 LAB — COMPREHENSIVE METABOLIC PANEL
ALT: 14 IU/L (ref 0–32)
AST: 14 IU/L (ref 0–40)
Albumin/Globulin Ratio: 1.8 (ref 1.2–2.2)
Albumin: 4.4 g/dL (ref 3.5–5.5)
Alkaline Phosphatase: 64 IU/L (ref 39–117)
BUN/Creatinine Ratio: 31 — ABNORMAL HIGH (ref 9–23)
BUN: 36 mg/dL — ABNORMAL HIGH (ref 6–24)
Bilirubin Total: <0.2 mg/dL (ref 0.0–1.2)
CO2: 22 mmol/L (ref 20–29)
CREATININE: 1.17 mg/dL — AB (ref 0.57–1.00)
Calcium: 10.1 mg/dL (ref 8.7–10.2)
Chloride: 100 mmol/L (ref 96–106)
GFR, EST AFRICAN AMERICAN: 61 mL/min/{1.73_m2} (ref >59)
GFR, EST NON AFRICAN AMERICAN: 53 mL/min/{1.73_m2} — AB (ref >59)
GLOBULIN, TOTAL: 2.5 g/dL (ref 1.5–4.5)
Glucose: 125 mg/dL — ABNORMAL HIGH (ref 65–99)
POTASSIUM: 5.6 mmol/L — AB (ref 3.5–5.2)
Sodium: 137 mmol/L (ref 134–144)
TOTAL PROTEIN: 6.9 g/dL (ref 6.0–8.5)

## 2016-12-09 LAB — THYROID PANEL WITH TSH
FREE THYROXINE INDEX: 1.9 (ref 1.2–4.9)
T3 UPTAKE RATIO: 28 % (ref 24–39)
T4 TOTAL: 6.9 ug/dL (ref 4.5–12.0)
TSH: 1.55 u[IU]/mL (ref 0.450–4.500)

## 2016-12-09 LAB — HEMOGLOBIN A1C
Est. average glucose Bld gHb Est-mCnc: 140 mg/dL
Hgb A1c MFr Bld: 6.5 % — ABNORMAL HIGH (ref 4.8–5.6)

## 2016-12-09 LAB — CORTISOL: Cortisol: 2.9 ug/dL

## 2016-12-09 LAB — C-REACTIVE PROTEIN: CRP: 28.1 mg/L — ABNORMAL HIGH (ref 0.0–4.9)

## 2016-12-09 LAB — SEDIMENTATION RATE: Sed Rate: 14 mm/hr (ref 0–40)

## 2016-12-10 ENCOUNTER — Encounter: Payer: Self-pay | Admitting: Family Medicine

## 2016-12-10 NOTE — Addendum Note (Signed)
Addended by: Shawnee Knapp on: 12/10/2016 04:51 PM   Modules accepted: Orders

## 2016-12-11 ENCOUNTER — Encounter: Payer: Self-pay | Admitting: Physician Assistant

## 2016-12-11 ENCOUNTER — Ambulatory Visit: Payer: Medicare Other | Admitting: Emergency Medicine

## 2016-12-11 DIAGNOSIS — Z5181 Encounter for therapeutic drug level monitoring: Secondary | ICD-10-CM

## 2016-12-11 DIAGNOSIS — N183 Chronic kidney disease, stage 3 unspecified: Secondary | ICD-10-CM

## 2016-12-11 DIAGNOSIS — R7989 Other specified abnormal findings of blood chemistry: Secondary | ICD-10-CM

## 2016-12-11 DIAGNOSIS — E2740 Unspecified adrenocortical insufficiency: Secondary | ICD-10-CM | POA: Diagnosis not present

## 2016-12-12 ENCOUNTER — Encounter: Payer: Self-pay | Admitting: Family Medicine

## 2016-12-12 NOTE — Progress Notes (Signed)
Lab only 

## 2016-12-12 NOTE — Addendum Note (Signed)
Addended by: Shawnee Knapp on: 12/12/2016 02:20 PM   Modules accepted: Orders

## 2016-12-13 ENCOUNTER — Encounter: Payer: Self-pay | Admitting: Family Medicine

## 2016-12-15 DIAGNOSIS — Z87828 Personal history of other (healed) physical injury and trauma: Secondary | ICD-10-CM | POA: Diagnosis not present

## 2016-12-15 DIAGNOSIS — E875 Hyperkalemia: Secondary | ICD-10-CM | POA: Diagnosis not present

## 2016-12-15 DIAGNOSIS — N183 Chronic kidney disease, stage 3 (moderate): Secondary | ICD-10-CM | POA: Diagnosis not present

## 2016-12-15 DIAGNOSIS — R768 Other specified abnormal immunological findings in serum: Secondary | ICD-10-CM | POA: Diagnosis not present

## 2016-12-15 DIAGNOSIS — I10 Essential (primary) hypertension: Secondary | ICD-10-CM | POA: Diagnosis not present

## 2016-12-15 LAB — CORTISOL, SALIVARY

## 2016-12-15 LAB — STATUS REPORT

## 2016-12-16 ENCOUNTER — Encounter: Payer: Self-pay | Admitting: Family Medicine

## 2016-12-18 ENCOUNTER — Encounter: Payer: Self-pay | Admitting: Physician Assistant

## 2016-12-18 LAB — SPECIMEN STATUS REPORT

## 2016-12-19 LAB — BASIC METABOLIC PANEL
BUN/Creatinine Ratio: 16 (ref 9–23)
BUN: 29 mg/dL — AB (ref 6–24)
CALCIUM: 9.3 mg/dL (ref 8.7–10.2)
CO2: 19 mmol/L — AB (ref 20–29)
CREATININE: 1.78 mg/dL — AB (ref 0.57–1.00)
Chloride: 98 mmol/L (ref 96–106)
GFR calc Af Amer: 37 mL/min/{1.73_m2} — ABNORMAL LOW (ref >59)
GFR, EST NON AFRICAN AMERICAN: 32 mL/min/{1.73_m2} — AB (ref >59)
GLUCOSE: 65 mg/dL (ref 65–99)
Potassium: 4.9 mmol/L (ref 3.5–5.2)
Sodium: 141 mmol/L (ref 134–144)

## 2016-12-19 LAB — ACTH: ACTH: 23.8 pg/mL (ref 7.2–63.3)

## 2016-12-19 LAB — ALDOSTERONE + RENIN ACTIVITY W/ RATIO: ALDOSTERONE: 61 ng/dL — ABNORMAL HIGH (ref 0.0–30.0)

## 2016-12-19 LAB — CORTISOL-AM, BLOOD: CORTISOL - AM: 5.1 ug/dL — AB (ref 6.2–19.4)

## 2016-12-21 ENCOUNTER — Telehealth: Payer: Self-pay | Admitting: Family Medicine

## 2016-12-21 NOTE — Telephone Encounter (Signed)
Dr Brigitte Pulse Pt calling to get results of her Cortisol am results and Salivary Cortisol test please respond pt has an appointment at [facility] July 31st she need these results

## 2016-12-22 ENCOUNTER — Ambulatory Visit (INDEPENDENT_AMBULATORY_CARE_PROVIDER_SITE_OTHER): Payer: Medicare Other | Admitting: Family Medicine

## 2016-12-22 DIAGNOSIS — Z5181 Encounter for therapeutic drug level monitoring: Secondary | ICD-10-CM | POA: Diagnosis not present

## 2016-12-22 DIAGNOSIS — N183 Chronic kidney disease, stage 3 unspecified: Secondary | ICD-10-CM

## 2016-12-22 LAB — BASIC METABOLIC PANEL
BUN/Creatinine Ratio: 19 (ref 9–23)
BUN: 22 mg/dL (ref 6–24)
CALCIUM: 10 mg/dL (ref 8.7–10.2)
CO2: 21 mmol/L (ref 20–29)
CREATININE: 1.15 mg/dL — AB (ref 0.57–1.00)
Chloride: 100 mmol/L (ref 96–106)
GFR calc Af Amer: 62 mL/min/{1.73_m2} (ref >59)
GFR calc non Af Amer: 54 mL/min/{1.73_m2} — ABNORMAL LOW (ref >59)
GLUCOSE: 118 mg/dL — AB (ref 65–99)
Potassium: 4.3 mmol/L (ref 3.5–5.2)
Sodium: 137 mmol/L (ref 134–144)

## 2016-12-22 NOTE — Telephone Encounter (Signed)
Patient was here this morning for OV. I am sure that results were discussed with patient then. Will confirm with Dr. Brigitte Pulse

## 2016-12-22 NOTE — Progress Notes (Signed)
Labs sent 12/22/16 10:07am

## 2016-12-23 ENCOUNTER — Encounter: Payer: Self-pay | Admitting: Family Medicine

## 2016-12-23 DIAGNOSIS — N183 Chronic kidney disease, stage 3 unspecified: Secondary | ICD-10-CM

## 2016-12-23 DIAGNOSIS — Z5181 Encounter for therapeutic drug level monitoring: Secondary | ICD-10-CM

## 2016-12-23 DIAGNOSIS — E875 Hyperkalemia: Secondary | ICD-10-CM

## 2016-12-25 ENCOUNTER — Encounter: Payer: Self-pay | Admitting: Physician Assistant

## 2016-12-25 ENCOUNTER — Ambulatory Visit (INDEPENDENT_AMBULATORY_CARE_PROVIDER_SITE_OTHER): Payer: Medicare Other | Admitting: Physician Assistant

## 2016-12-25 VITALS — BP 165/101 | HR 84 | Temp 98.0°F | Resp 18 | Ht 64.0 in | Wt 287.6 lb

## 2016-12-25 DIAGNOSIS — I10 Essential (primary) hypertension: Secondary | ICD-10-CM | POA: Diagnosis not present

## 2016-12-25 NOTE — Patient Instructions (Addendum)
  Increase your spironolactone to 1/2 of your original dose. I will check you Creatinine and you potassium and will follow up with you via phone. Keep all of your other medications the same for now.    IF you received an x-ray today, you will receive an invoice from [facility]. Please contact [facility] at [phone] with questions or concerns regarding your invoice.   IF you received labwork today, you will receive an invoice from [facility]. Please contact [facility] at [phone] with questions or concerns regarding your invoice.   Our billing staff will not be able to assist you with questions regarding bills from these companies.  You will be contacted with the lab results as soon as they are available. The fastest way to get your results is to activate your My Chart account. Instructions are located on the last page of this paperwork. If you have not heard from [place] regarding the results in 2 weeks, please contact this office.

## 2016-12-25 NOTE — Progress Notes (Signed)
Lab only visit today. Pt not seen by provider.  Results for orders placed or performed in visit on 55/37/48  Basic metabolic panel  Result Value Ref Range   Glucose 118 (H) 65 - 99 mg/dL   BUN 22 6 - 24 mg/dL   Creatinine, Ser 1.15 (H) 0.57 - 1.00 mg/dL   GFR calc non Af Amer 54 (L) >59 mL/min/1.73   GFR calc Af Amer 62 >59 mL/min/1.73   BUN/Creatinine Ratio 19 9 - 23   Sodium 137 134 - 144 mmol/L   Potassium 4.3 3.5 - 5.2 mmol/L   Chloride 100 96 - 106 mmol/L   CO2 21 20 - 29 mmol/L   Calcium 10.0 8.7 - 10.2 mg/dL   There were no vitals taken for this visit.

## 2016-12-25 NOTE — Progress Notes (Signed)
Results forwarded to Dr. Joelyn Oms 12/25/16 12:09pm

## 2016-12-25 NOTE — Progress Notes (Signed)
12/25/2016 5:39 PM   DOB: 1960/10/16 / MRN: [id number]  SUBJECTIVE:  Valerie [name] is a 56 y.o. female presenting to talk about her medications.  She was recently taken off of her spironolactone for a week and her feet swelled and it made it difficult for her to walk.  She was started back on 1/4 of her previous dose of spironolactone and ACE was cut in half.  She feels that her Dr. Joelyn Oms her nephrologist is not listening to her. She was placed on spironolactone for swelling and a low potassium. She tells me that her potassium was mildly increased and this precipitated the medication changes.    She has an appointment with [facility] rheumatologist in October and has a new nephrology appointment.   She is allergic to dilaudid [hydromorphone hcl]; pregabalin; metoclopramide hcl; neurontin [gabapentin]; other; and serotonin reuptake inhibitors (ssris).   She  has a past medical history of Acute ischemic colitis ([place]); Anemia (05/01/2012); Anxiety; ARF (acute renal failure) ([place]) (12/28/2015); Arthritis; Asthma; Basal cell carcinoma of upper back (~ 2011); DDD (degenerative disc disease), lumbar; Diarrhea; Fibromyalgia; First degree heart block; Gastro-esophageal reflux; Headache(784.0); Hypertension; Mental disorder; Microscopic polyangiitis ([place]); Nerve pain; Neuromuscular disorder ([place]); Pancreatitis; Pneumonia (~ 2014; 08/2015); Polymyalgia rheumatica ([place]) (onset 08/2015); Precancerous melanosis; and Sleep apnea.    She  reports that she has never smoked. She has never used smokeless tobacco. She reports that she does not drink alcohol or use drugs. She  reports that she does not currently engage in sexual activity. The patient  has a past surgical history that includes Shoulder arthroscopy with rotator cuff repair and open biceps tenodesis (Bilateral, 2010-2013); Nasal septoplasty w/ turbinoplasty (1998); Esophagogastroduodenoscopy (12/21/2011); Flexible sigmoidoscopy (05/01/2012); Excision basal  cell carcinoma (~ 2011); Esophagogastroduodenoscopy (N/A, 11/06/2012); Botox injection (N/A, 11/06/2012); Esophagogastroduodenoscopy (N/A, 12/04/2013); Botox injection (N/A, 12/04/2013); Esophagogastroduodenoscopy (N/A, 02/26/2014); Botox injection (N/A, 02/26/2014); Esophagogastroduodenoscopy (egd) with propofol (N/A, 07/08/2015); Botox injection (N/A, 07/08/2015); Laparoscopic cholecystectomy (01/2002); Esophagogastroduodenoscopy (egd) with propofol (N/A, 04/27/2016); Botox injection (N/A, 04/27/2016); and Flexible sigmoidoscopy (Left, 09/28/2016).  Her family history includes Arthritis in her mother and paternal grandmother; COPD in her mother; Cancer in her mother; Heart disease in her mother, paternal grandfather, and paternal grandmother; Hypertension in her mother.  Review of Systems  Constitutional: Negative for chills, diaphoresis and fever.  Eyes: Negative.   Respiratory: Negative for cough, hemoptysis, sputum production, shortness of breath and wheezing.   Cardiovascular: Negative for chest pain, orthopnea and leg swelling.  Gastrointestinal: Negative for nausea.  Skin: Negative for rash.  Neurological: Negative for dizziness, sensory change, speech change, focal weakness and headaches.      The problem list and medications were reviewed and updated by myself where necessary and exist elsewhere in the encounter.   OBJECTIVE:  BP (!) 165/101   Pulse 84   Temp 98 F (36.7 C) (Oral)   Resp 18   Ht 5\' 4"  (1.626 m)   Wt 287 lb 9.6 oz (130.5 kg)   SpO2 98%   BMI 49.37 kg/m   BP Readings from Last 3 Encounters:  12/25/16 (!) 165/101  12/08/16 136/89  11/27/16 108/71     Lab Results  Component Value Date   CREATININE 1.15 (H) 12/22/2016     Wt Readings from Last 3 Encounters:  12/25/16 287 lb 9.6 oz (130.5 kg)  12/08/16 291 lb (132 kg)  11/27/16 286 lb (129.7 kg)      Physical Exam  Constitutional: She is active.  Non-toxic appearance.  Cardiovascular: Normal rate,  regular rhythm, S1 normal, S2 normal, normal heart sounds and intact distal pulses.  Exam reveals no gallop, no friction rub and no decreased pulses.   No murmur heard. Pulmonary/Chest: Effort normal. No stridor. No tachypnea. No respiratory distress. She has no wheezes. She has no rales.  Abdominal: She exhibits no distension.  Musculoskeletal: She exhibits no edema.  Neurological: She is alert.  Skin: Skin is warm and dry. She is not diaphoretic. No pallor.    No results found for this or any previous visit (from the past 72 hour(s)).  No results found.  ASSESSMENT AND PLAN:  Valerie [name] was seen today for results and follow-up.  Diagnoses and all orders for this visit:  Uncontrolled hypertension: She will increase her potassium sparing diuretic to half of the original dose while I await her labs.   -     Creatinine, serum -     Potassium    The patient is advised to call or return to clinic if she does not see an improvement in symptoms, or to seek the care of the closest emergency department if she worsens with the above plan.   Philis Fendt, MHS, PA-C [facility] 12/25/2016 5:38 PM

## 2016-12-26 ENCOUNTER — Encounter: Payer: Self-pay | Admitting: Physician Assistant

## 2016-12-27 ENCOUNTER — Encounter: Payer: Self-pay | Admitting: Physician Assistant

## 2016-12-27 LAB — CREATININE, SERUM
CREATININE: 1 mg/dL (ref 0.57–1.00)
GFR calc Af Amer: 74 mL/min/{1.73_m2} (ref >59)
GFR, EST NON AFRICAN AMERICAN: 64 mL/min/{1.73_m2} (ref >59)

## 2016-12-27 LAB — POTASSIUM: Potassium: 4.5 mmol/L (ref 3.5–5.2)

## 2016-12-29 LAB — ALDOSTERONE + RENIN ACTIVITY W/ RATIO
ALDOS/RENIN RATIO: 2 (ref 0.0–30.0)
ALDOSTERONE: 6.4 ng/dL (ref 0.0–30.0)
Renin: 3.155 ng/mL/hr (ref 0.167–5.380)

## 2016-12-31 ENCOUNTER — Encounter: Payer: Self-pay | Admitting: Physician Assistant

## 2017-01-01 ENCOUNTER — Telehealth: Payer: Self-pay

## 2017-01-01 DIAGNOSIS — R7989 Other specified abnormal findings of blood chemistry: Secondary | ICD-10-CM

## 2017-01-01 NOTE — Telephone Encounter (Signed)
Order for BMET placed in chart by Dr Brigitte Pulse, patient has been made aware./ S.Providence Stivers,CMA

## 2017-01-01 NOTE — Telephone Encounter (Signed)
Lets have her come in for a BMET only please in about 7 days.  PT made aware via mychart.  Please place the future order. Philis Fendt, MS, PA-C 10:34 AM, 01/01/2017

## 2017-01-02 LAB — CORTISOL, SALIVARY: Salivary Cortisol, MS: 0.293 ug/dL

## 2017-01-03 ENCOUNTER — Encounter: Payer: Self-pay | Admitting: Family Medicine

## 2017-01-08 ENCOUNTER — Encounter: Payer: Self-pay | Admitting: Family Medicine

## 2017-01-08 ENCOUNTER — Other Ambulatory Visit: Payer: Medicare Other | Admitting: Family Medicine

## 2017-01-08 ENCOUNTER — Encounter: Payer: Self-pay | Admitting: Physician Assistant

## 2017-01-08 DIAGNOSIS — E875 Hyperkalemia: Secondary | ICD-10-CM | POA: Diagnosis not present

## 2017-01-08 DIAGNOSIS — N183 Chronic kidney disease, stage 3 unspecified: Secondary | ICD-10-CM

## 2017-01-08 DIAGNOSIS — Z5181 Encounter for therapeutic drug level monitoring: Secondary | ICD-10-CM

## 2017-01-09 DIAGNOSIS — T380X5D Adverse effect of glucocorticoids and synthetic analogues, subsequent encounter: Secondary | ICD-10-CM | POA: Diagnosis not present

## 2017-01-09 DIAGNOSIS — R768 Other specified abnormal immunological findings in serum: Secondary | ICD-10-CM | POA: Diagnosis not present

## 2017-01-09 DIAGNOSIS — E233 Hypothalamic dysfunction, not elsewhere classified: Secondary | ICD-10-CM | POA: Diagnosis not present

## 2017-01-09 DIAGNOSIS — E279 Disorder of adrenal gland, unspecified: Secondary | ICD-10-CM | POA: Diagnosis not present

## 2017-01-09 LAB — BASIC METABOLIC PANEL
BUN / CREAT RATIO: 18 (ref 9–23)
BUN: 21 mg/dL (ref 6–24)
CO2: 21 mmol/L (ref 20–29)
CREATININE: 1.16 mg/dL — AB (ref 0.57–1.00)
Calcium: 9.6 mg/dL (ref 8.7–10.2)
Chloride: 104 mmol/L (ref 96–106)
GFR, EST AFRICAN AMERICAN: 62 mL/min/{1.73_m2} (ref >59)
GFR, EST NON AFRICAN AMERICAN: 54 mL/min/{1.73_m2} — AB (ref >59)
Glucose: 121 mg/dL — ABNORMAL HIGH (ref 65–99)
POTASSIUM: 4.6 mmol/L (ref 3.5–5.2)
SODIUM: 140 mmol/L (ref 134–144)

## 2017-01-10 ENCOUNTER — Encounter: Payer: Self-pay | Admitting: Family Medicine

## 2017-01-10 NOTE — Telephone Encounter (Signed)
Pt states that she is not feeling any better and would like you to give her a call.

## 2017-01-11 ENCOUNTER — Ambulatory Visit (INDEPENDENT_AMBULATORY_CARE_PROVIDER_SITE_OTHER): Payer: Medicare Other | Admitting: Family Medicine

## 2017-01-11 ENCOUNTER — Other Ambulatory Visit: Payer: Self-pay | Admitting: Physician Assistant

## 2017-01-11 ENCOUNTER — Encounter: Payer: Self-pay | Admitting: Family Medicine

## 2017-01-11 VITALS — BP 126/85 | HR 103 | Temp 98.9°F | Resp 18 | Ht 64.0 in | Wt 286.8 lb

## 2017-01-11 DIAGNOSIS — R1031 Right lower quadrant pain: Secondary | ICD-10-CM

## 2017-01-11 DIAGNOSIS — R1011 Right upper quadrant pain: Secondary | ICD-10-CM

## 2017-01-11 DIAGNOSIS — G8929 Other chronic pain: Secondary | ICD-10-CM

## 2017-01-11 LAB — POCT URINALYSIS DIP (MANUAL ENTRY)
Bilirubin, UA: NEGATIVE
Blood, UA: NEGATIVE
Glucose, UA: NEGATIVE mg/dL
Ketones, POC UA: NEGATIVE mg/dL
LEUKOCYTES UA: NEGATIVE
NITRITE UA: NEGATIVE
PH UA: 5.5 (ref 5.0–8.0)
PROTEIN UA: NEGATIVE mg/dL
Spec Grav, UA: 1.01 (ref 1.010–1.025)
Urobilinogen, UA: 0.2 E.U./dL

## 2017-01-11 MED ORDER — FUROSEMIDE 20 MG PO TABS: 20.0000 mg | ORAL_TABLET | Freq: Every day | ORAL | 1 refills | Status: DC

## 2017-01-11 NOTE — Patient Instructions (Signed)
     IF you received an x-ray today, you will receive an invoice from [facility]. Please contact [facility] at [phone] with questions or concerns regarding your invoice.   IF you received labwork today, you will receive an invoice from [facility]. Please contact [facility] at [phone] with questions or concerns regarding your invoice.   Our billing staff will not be able to assist you with questions regarding bills from these companies.  You will be contacted with the lab results as soon as they are available. The fastest way to get your results is to activate your My Chart account. Instructions are located on the last page of this paperwork. If you have not heard from us regarding the results in 2 weeks, please contact this office.     

## 2017-01-11 NOTE — Progress Notes (Signed)
Subjective:    Patient ID: Valerie [name], female    DOB: Jul 17, 1960, 56 y.o.   MRN: [id number] Chief Complaint  Patient presents with  . Abdominal Pain    for a couple weeks now     HPI Has to be off of prednisone for 30d before further testing. Her next appt is scheduled for 8/28 with Dr. Marcie Mowers. She can restart methotrexate at 7.5 qd rather than 10 but she wanted to be checked for other infxns due to pain before restarting that. Stopped biotin as well per Dr. Michaelyn Barter  She repeated the bmp on Friday. Stopped the spironolactone but now has weaned back up to 50mg  (was on 100mg ) and the benazepril has been cut to 20mg .   Normal BM RUQ boring pain radiating to right flank. Urine darker than normal today. Right pelvic stabbing pain.   Night sweats which started when she went off the prednisone.  Anhedonia - couldn't make it to choir practice.  Fearful about starting an antidepressant due to the numerous amout  Making herself get out of bed Working with her psychologist   08/16/2015 she felt fine and after that it has continued to go worse.   Past Medical History:  Diagnosis Date  . Acute ischemic colitis ([place])   . Anemia 05/01/2012  . Anxiety   . ARF (acute renal failure) ([place]) 12/28/2015  . Arthritis    "basically ~ q joint" (12/28/2015)  . Asthma   . Basal cell carcinoma of upper back ~ 2011  . DDD (degenerative disc disease), lumbar   . Diarrhea    EPISODIC  . Fibromyalgia   . First degree heart block   . Gastro-esophageal reflux   . Headache(784.0)    "muscular w/migraine intensity; ~ 5-6 months" (12/28/2015)  . Hypertension   . Mental disorder    reports PTSD  . Microscopic polyangiitis ([place])   . Nerve pain    nerve damage to right arm  . Neuromuscular disorder ([place])   . Pancreatitis   . Pneumonia ~ 2014; 08/2015  . Polymyalgia rheumatica ([place]) onset 08/2015   changed diagnosis to MPA  . Precancerous melanosis    "2nd toe left foot; 5th toe of right  foot; right temple; had them all cut off"  . Sleep apnea    CPAP-9   Past Surgical History:  Procedure Laterality Date  . BASAL CELL CARCINOMA EXCISION  ~ 2011   back  . BOTOX INJECTION N/A 11/06/2012   Procedure: BOTOX INJECTION;  Surgeon: Missy Sabins, MD;  Location: [facility];  Service: Endoscopy;  Laterality: N/A;  . BOTOX INJECTION N/A 12/04/2013   Procedure: BOTOX INJECTION;  Surgeon: Missy Sabins, MD;  Location: [place];  Service: Endoscopy;  Laterality: N/A;  . BOTOX INJECTION N/A 02/26/2014   Procedure: BOTOX INJECTION;  Surgeon: Missy Sabins, MD;  Location: [place];  Service: Endoscopy;  Laterality: N/A;  . BOTOX INJECTION N/A 07/08/2015   Procedure: BOTOX INJECTION;  Surgeon: Teena Irani, MD;  Location: [facility];  Service: Endoscopy;  Laterality: N/A;  . BOTOX INJECTION N/A 04/27/2016   Procedure: BOTOX INJECTION;  Surgeon: Teena Irani, MD;  Location: [facility];  Service: Endoscopy;  Laterality: N/A;  . ESOPHAGOGASTRODUODENOSCOPY  12/21/2011   Procedure: ESOPHAGOGASTRODUODENOSCOPY (EGD);  Surgeon: Missy Sabins, MD;  [facility]COPY;  Service: Endoscopy;  Laterality: N/A;  . ESOPHAGOGASTRODUODENOSCOPY N/A 11/06/2012   Procedure: ESOPHAGOGASTRODUODENOSCOPY (EGD);  Surgeon: Missy Sabins, MD;  Location: [facility];  Service: Endoscopy;  Laterality: N/A;  . ESOPHAGOGASTRODUODENOSCOPY N/A 12/04/2013   Procedure: ESOPHAGOGASTRODUODENOSCOPY (EGD);  Surgeon: Missy Sabins, MD;  Location: [facility] ENDOSCOPY;  Service: Endoscopy;  Laterality: N/A;  . ESOPHAGOGASTRODUODENOSCOPY N/A 02/26/2014   Procedure: ESOPHAGOGASTRODUODENOSCOPY (EGD);  Surgeon: Missy Sabins, MD;  Location[facility]COPY;  Service: Endoscopy;  Laterality: N/A;  . ESOPHAGOGASTRODUODENOSCOPY (EGD) WITH PROPOFOL N/A 07/08/2015   Procedure: ESOPHAGOGASTRODUODENOSCOPY (EGD) WITH PROPOFOL;  Surgeon: Teena Irani, MD;  Location: [facility];  Service: Endoscopy;  Laterality: N/A;  . ESOPHAGOGASTRODUODENOSCOPY (EGD)  WITH PROPOFOL N/A 04/27/2016   Procedure: ESOPHAGOGASTRODUODENOSCOPY (EGD) WITH PROPOFOL;  Surgeon: Teena Irani, MD;  Location: [facility];  Service: Endoscopy;  Laterality: N/A;  . FLEXIBLE SIGMOIDOSCOPY  05/01/2012   Procedure: FLEXIBLE SIGMOIDOSCOPY;  Surgeon: Jeryl Columbia, MD;  Location: [facility];  Service: Endoscopy;  Laterality: N/A;  unprepped  . FLEXIBLE SIGMOIDOSCOPY Left 09/28/2016   Procedure: FLEXIBLE SIGMOIDOSCOPY;  Surgeon: Wilford Corner, MD;  Location: [facility];  Service: Endoscopy;  Laterality: Left;  . LAPAROSCOPIC CHOLECYSTECTOMY  01/2002  . NASAL SEPTOPLASTY W/ TURBINOPLASTY  1998  . SHOULDER ARTHROSCOPY WITH ROTATOR CUFF REPAIR AND OPEN BICEPS TENODESIS Bilateral 2010-2013   left-right   Current Outpatient Prescriptions on File Prior to Visit  Medication Sig Dispense Refill  . allopurinol (ZYLOPRIM) 300 MG tablet Take 0.5 tablets (150 mg total) by mouth daily. 90 tablet 1  . ALPRAZolam (XANAX) 1 MG tablet Take 1 tablet (1 mg total) by mouth 3 (three) times daily as needed for anxiety. 90 tablet 1  . benazepril (LOTENSIN) 40 MG tablet Take 1 tablet (40 mg total) by mouth daily. (Patient taking differently: Take 20 mg by mouth daily. ) 90 tablet 3  . calcium carbonate (OSCAL) 1500 (600 Ca) MG TABS tablet Take 600 mg of elemental calcium by mouth daily with breakfast.    . cetirizine (ZYRTEC) 10 MG tablet Take 10 mg by mouth daily. Alternates between Allegra and Zyrtec as needed for allergies    . Cholecalciferol (VITAMIN D3) 5000 units CAPS Take 1 capsule by mouth.    . dexlansoprazole (DEXILANT) 60 MG capsule Take 60 mg by mouth 2 (two) times daily.    . fexofenadine (ALLEGRA) 180 MG tablet Take 180 mg by mouth daily as needed. Switches between Allegra and Zyrtec as needed for allergies    . fluticasone (FLONASE) 50 MCG/ACT nasal spray Place 2 sprays into both nostrils daily. 48 g 2  . folic acid (FOLVITE) 1 MG tablet Take 1 mg by mouth daily.    Marland Kitchen  HYDROcodone-acetaminophen (NORCO) 10-325 MG per tablet Take 1-2 tablets by mouth every 6 (six) hours as needed for moderate pain (pain.).     Marland Kitchen hydrOXYzine (ATARAX/VISTARIL) 10 MG tablet Take 1-3 tablets (10-30 mg total) by mouth 3 (three) times daily as needed. 90 tablet 1  . metoprolol succinate (TOPROL-XL) 100 MG 24 hr tablet Take 1 tablet (100 mg total) by mouth daily. Take with or immediately following a meal. 90 tablet 3  . modafinil (PROVIGIL) 200 MG tablet Take 0.5 tablets (100 mg total) by mouth 2 (two) times daily. 90 tablet 1  . montelukast (SINGULAIR) 10 MG tablet TAKE 1 TABLET (10 MG TOTAL) BY MOUTH AT BEDTIME 90 tablet 3  . morphine (MS CONTIN) 30 MG 12 hr tablet Take 30 mg by mouth every 8 (eight) hours. Not from [place]    . Multiple Vitamins-Minerals (MULTIVITAMIN PO) Take 1 tablet by mouth daily.    . Olopatadine HCl (PAZEO) 0.7 % SOLN Apply  1 drop to eye daily. 2.5 mL 11  . ondansetron (ZOFRAN) 4 MG tablet Take 1 tablet (4 mg total) by mouth every 8 (eight) hours as needed for nausea or vomiting. 20 tablet 0  . Pancrelipase, Lip-Prot-Amyl, (CREON) 24000 UNITS CPEP Take 72,000 Units by mouth 3 (three) times daily as needed (for pancreatitis).     Marland Kitchen PROAIR HFA 108 (90 Base) MCG/ACT inhaler INHALE 2 PUFFS INTO THE LUNGS AS NEEDED FOR WHEEZING OR SHORTNESS OF BREATH. 8.5 each 0  . promethazine (PHENERGAN) 25 MG tablet Take 1 tablet (25 mg total) by mouth every 6 (six) hours as needed for nausea or vomiting. 30 tablet 1  . spironolactone (ALDACTONE) 100 MG tablet Take 1 tablet (100 mg total) by mouth daily. (Patient taking differently: Take 25 mg by mouth daily. ) 90 tablet 3  . sucralfate (CARAFATE) 1 GM/10ML suspension Take 10 mLs (1 g total) by mouth 4 (four) times daily as needed (stomach pain). 420 mL 1   No current facility-administered medications on file prior to visit.    Allergies  Allergen Reactions  . Dilaudid [Hydromorphone Hcl] Other (See Comments)    Headache   .  Pregabalin Other (See Comments)    Drug induced stupor  . Metoclopramide Hcl Other (See Comments)    "crawling inside"  . Neurontin [Gabapentin] Other (See Comments)    Tremors   . Other Other (See Comments)    Avoid all SSRI's and SNRI's- damaged pancreas  . Serotonin Reuptake Inhibitors (Ssris) Other (See Comments)    Damage to pancreas and bowel/urinary incontinence    Family History  Problem Relation Age of Onset  . Heart disease Mother   . COPD Mother   . Arthritis Mother   . Cancer Mother   . Hypertension Mother   . Arthritis Paternal Grandmother   . Heart disease Paternal Grandmother   . Heart disease Paternal Grandfather    Social History   Social History  . Marital status: Single    Spouse name: N/A  . Number of children: 0  . Years of education: College   Occupational History  . disabled    Social History Main Topics  . Smoking status: Never Smoker  . Smokeless tobacco: Never Used  . Alcohol use No  . Drug use: No  . Sexual activity: Not Currently   Other Topics Concern  . None   Social History Narrative   Caffeine 2 cups iced tea daily avg.   Depression screen [facility] 2/9 11/25/2016 11/17/2016 10/23/2016 10/09/2016 09/18/2016  Decreased Interest 1 2 0 0 0  Down, Depressed, Hopeless 1 2 0 0 0  PHQ - 2 Score 2 4 0 0 0  Altered sleeping 0 2 - - -  Tired, decreased energy 0 2 - - -  Change in appetite 0 2 - - -  Feeling bad or failure about yourself  0 1 - - -  Trouble concentrating 0 2 - - -  Moving slowly or fidgety/restless 0 1 - - -  Suicidal thoughts 0 0 - - -  PHQ-9 Score 2 14 - - -  Difficult doing work/chores Somewhat difficult Very difficult - - -  Some recent data might be hidden    Review of Systems See hpi    Objective:   Physical Exam  Constitutional: She is oriented to person, place, and time. She appears well-developed and well-nourished. No distress.  HENT:  Head: Normocephalic and atraumatic.  Right Ear: External ear normal.  Eyes:  Conjunctivae are normal. No scleral icterus.  Pulmonary/Chest: Effort normal.  Neurological: She is alert and oriented to person, place, and time.  Skin: Skin is warm and dry. She is not diaphoretic. No erythema.  Psychiatric: She is withdrawn. She exhibits a depressed mood. She exhibits normal recent memory and normal remote memory.     Results for orders placed or performed in visit on 01/11/17  POCT urinalysis dipstick  Result Value Ref Range   Color, UA yellow yellow   Clarity, UA clear clear   Glucose, UA negative negative mg/dL   Bilirubin, UA negative negative   Ketones, POC UA negative negative mg/dL   Spec Grav, UA 1.010 1.010 - 1.025   Blood, UA negative negative   pH, UA 5.5 5.0 - 8.0   Protein Ur, POC negative negative mg/dL   Urobilinogen, UA 0.2 0.2 or 1.0 E.U./dL   Nitrite, UA Negative Negative   Leukocytes, UA Negative Negative       BP 126/85   Pulse (!) 103   Temp 98.9 F (37.2 C) (Oral)   Resp 18   Ht 5\' 4"  (1.626 m)   Wt 286 lb 12.8 oz (130.1 kg)   SpO2 95%   BMI 49.23 kg/m   Assessment & Plan:   1. Right upper quadrant abdominal pain   2. Abdominal pain, chronic, right lower quadrant     Orders Placed This Encounter  Procedures  . Urine Culture  . CBC with Differential/Platelet  . Comprehensive metabolic panel  . POCT urinalysis dipstick    Meds ordered this encounter  Medications  . methotrexate (RHEUMATREX) 2.5 MG tablet    Sig: Take 2.5 mg by mouth 3 (three) times daily. Caution:Chemotherapy. Protect from light.  . furosemide (LASIX) 20 MG tablet    Sig: Take 1 tablet (20 mg total) by mouth daily. As needed For edema    Dispense:  30 tablet    Refill:  1      Delman Cheadle, M.D.  Primary Care at [facility] [address] [place], [place] [address] [phone] phone [phone] fax  01/12/17 2:03 AM

## 2017-01-12 ENCOUNTER — Telehealth: Payer: Self-pay

## 2017-01-12 LAB — URINE CULTURE

## 2017-01-12 LAB — CBC WITH DIFFERENTIAL/PLATELET
BASOS ABS: 0 10*3/uL (ref 0.0–0.2)
Basos: 1 %
EOS (ABSOLUTE): 0.1 10*3/uL (ref 0.0–0.4)
EOS: 1 %
HEMATOCRIT: 41 % (ref 34.0–46.6)
HEMOGLOBIN: 13 g/dL (ref 11.1–15.9)
IMMATURE GRANULOCYTES: 0 %
Immature Grans (Abs): 0 10*3/uL (ref 0.0–0.1)
Lymphocytes Absolute: 2.3 10*3/uL (ref 0.7–3.1)
Lymphs: 31 %
MCH: 29 pg (ref 26.6–33.0)
MCHC: 31.7 g/dL (ref 31.5–35.7)
MCV: 92 fL (ref 79–97)
MONOCYTES: 5 %
Monocytes Absolute: 0.4 10*3/uL (ref 0.1–0.9)
NEUTROS PCT: 62 %
Neutrophils Absolute: 4.7 10*3/uL (ref 1.4–7.0)
Platelets: 314 10*3/uL (ref 150–379)
RBC: 4.48 x10E6/uL (ref 3.77–5.28)
RDW: 14 % (ref 12.3–15.4)
WBC: 7.5 10*3/uL (ref 3.4–10.8)

## 2017-01-12 LAB — COMPREHENSIVE METABOLIC PANEL
ALBUMIN: 4.5 g/dL (ref 3.5–5.5)
ALT: 18 IU/L (ref 0–32)
AST: 17 IU/L (ref 0–40)
Albumin/Globulin Ratio: 1.7 (ref 1.2–2.2)
Alkaline Phosphatase: 49 IU/L (ref 39–117)
BUN/Creatinine Ratio: 17 (ref 9–23)
BUN: 18 mg/dL (ref 6–24)
Bilirubin Total: <0.2 mg/dL (ref 0.0–1.2)
CALCIUM: 10.1 mg/dL (ref 8.7–10.2)
CO2: 22 mmol/L (ref 20–29)
CREATININE: 1.08 mg/dL — AB (ref 0.57–1.00)
Chloride: 102 mmol/L (ref 96–106)
GFR calc Af Amer: 67 mL/min/{1.73_m2} (ref >59)
GFR, EST NON AFRICAN AMERICAN: 58 mL/min/{1.73_m2} — AB (ref >59)
GLOBULIN, TOTAL: 2.7 g/dL (ref 1.5–4.5)
GLUCOSE: 101 mg/dL — AB (ref 65–99)
POTASSIUM: 4.8 mmol/L (ref 3.5–5.2)
SODIUM: 141 mmol/L (ref 134–144)
Total Protein: 7.2 g/dL (ref 6.0–8.5)

## 2017-01-12 NOTE — Progress Notes (Signed)
Faxed 01/12/17

## 2017-01-12 NOTE — Telephone Encounter (Signed)
Spoke with patient and called to see if she was able to see the message left on Mychart. Patient verbalized that she did and that she started the lasix and the methotrexate today. Advised patient to call back if she had any other concerns or questions.

## 2017-01-14 DIAGNOSIS — E279 Disorder of adrenal gland, unspecified: Secondary | ICD-10-CM

## 2017-01-14 DIAGNOSIS — T380X5A Adverse effect of glucocorticoids and synthetic analogues, initial encounter: Secondary | ICD-10-CM | POA: Insufficient documentation

## 2017-01-14 DIAGNOSIS — E233 Hypothalamic dysfunction, not elsewhere classified: Secondary | ICD-10-CM | POA: Insufficient documentation

## 2017-01-15 DIAGNOSIS — M542 Cervicalgia: Secondary | ICD-10-CM | POA: Diagnosis not present

## 2017-01-16 ENCOUNTER — Encounter: Payer: Self-pay | Admitting: Family Medicine

## 2017-01-16 DIAGNOSIS — M47818 Spondylosis without myelopathy or radiculopathy, sacral and sacrococcygeal region: Secondary | ICD-10-CM

## 2017-01-16 DIAGNOSIS — M47819 Spondylosis without myelopathy or radiculopathy, site unspecified: Secondary | ICD-10-CM

## 2017-01-25 DIAGNOSIS — M542 Cervicalgia: Secondary | ICD-10-CM | POA: Diagnosis not present

## 2017-01-25 DIAGNOSIS — M4698 Unspecified inflammatory spondylopathy, sacral and sacrococcygeal region: Secondary | ICD-10-CM | POA: Diagnosis not present

## 2017-01-25 DIAGNOSIS — M47819 Spondylosis without myelopathy or radiculopathy, site unspecified: Secondary | ICD-10-CM | POA: Diagnosis not present

## 2017-01-26 DIAGNOSIS — R768 Other specified abnormal immunological findings in serum: Secondary | ICD-10-CM | POA: Diagnosis not present

## 2017-01-29 ENCOUNTER — Encounter: Payer: Self-pay | Admitting: Family Medicine

## 2017-01-29 ENCOUNTER — Ambulatory Visit (INDEPENDENT_AMBULATORY_CARE_PROVIDER_SITE_OTHER): Payer: Medicare Other | Admitting: Family Medicine

## 2017-01-29 VITALS — BP 122/84 | HR 71 | Temp 98.2°F | Resp 18 | Ht 64.0 in | Wt 289.0 lb

## 2017-01-29 DIAGNOSIS — M2560 Stiffness of unspecified joint, not elsewhere classified: Secondary | ICD-10-CM

## 2017-01-29 DIAGNOSIS — N183 Chronic kidney disease, stage 3 unspecified: Secondary | ICD-10-CM

## 2017-01-29 DIAGNOSIS — M317 Microscopic polyangiitis: Secondary | ICD-10-CM | POA: Diagnosis not present

## 2017-01-29 DIAGNOSIS — R609 Edema, unspecified: Secondary | ICD-10-CM | POA: Diagnosis not present

## 2017-01-29 DIAGNOSIS — M542 Cervicalgia: Secondary | ICD-10-CM | POA: Diagnosis not present

## 2017-01-29 DIAGNOSIS — M797 Fibromyalgia: Secondary | ICD-10-CM | POA: Diagnosis not present

## 2017-01-29 DIAGNOSIS — I159 Secondary hypertension, unspecified: Secondary | ICD-10-CM

## 2017-01-29 DIAGNOSIS — E2749 Other adrenocortical insufficiency: Secondary | ICD-10-CM

## 2017-01-29 NOTE — Patient Instructions (Addendum)
Ask Valerie [name] what he thinks about CranioSacral therapy for you - I am curious.     Weight yourself daily right after your shower and use the restroom. If your weight increases by 3 lbs overnight or 5 lbs in a week, then start the lasix daily until you get back to your baseline dry weight of 281 lbs. When you are back to between 281-283 lbs, then stop the lasix.    IF you received an x-ray today, you will receive an invoice from [facility]. Please contact [facility] at [phone] with questions or concerns regarding your invoice.   IF you received labwork today, you will receive an invoice from [facility]. Please contact [facility] at [phone] with questions or concerns regarding your invoice.   Our billing staff will not be able to assist you with questions regarding bills from these companies.  You will be contacted with the lab results as soon as they are available. The fastest way to get your results is to activate your My Chart account. Instructions are located on the last page of this paperwork. If you have not heard from [place] regarding the results in 2 weeks, please contact this office.     Peripheral Edema Peripheral edema is swelling that is caused by a buildup of fluid. Peripheral edema most often affects the lower legs, ankles, and feet. It can also develop in the arms, hands, and face. The area of the body that has peripheral edema will look swollen. It may also feel heavy or warm. Your clothes may start to feel tight. Pressing on the area may make a temporary dent in your skin. You may not be able to move your arm or leg as much as usual. There are many causes of peripheral edema. It can be a complication of other diseases, such as congestive heart failure, kidney disease, or a problem with your blood circulation. It also can be a side effect of certain medicines. It often happens to women during pregnancy. Sometimes, the cause is not known. Treating the underlying  condition is often the only treatment for peripheral edema. Follow these instructions at home: Pay attention to any changes in your symptoms. Take these actions to help with your discomfort:  Raise (elevate) your legs while you are sitting or lying down.  Move around often to prevent stiffness and to lessen swelling. Do not sit or stand for long periods of time.  Wear support stockings as told by your health care provider.  Follow instructions from your health care provider about limiting salt (sodium) in your diet. Sometimes eating less salt can reduce swelling.  Take over-the-counter and prescription medicines only as told by your health care provider. Your health care provider may prescribe medicine to help your body get rid of excess water (diuretic).  Keep all follow-up visits as told by your health care provider. This is important.  Contact a health care provider if:  You have a fever.  Your edema starts suddenly or is getting worse, especially if you are pregnant or have a medical condition.  You have swelling in only one leg.  You have increased swelling and pain in your legs. Get help right away if:  You develop shortness of breath, especially when you are lying down.  You have pain in your chest or abdomen.  You feel weak.  You faint. This information is not intended to replace advice given to you by your health care provider. Make sure you discuss any questions you have with  your health care provider. Document Released: 07/06/2004 Document Revised: 11/01/2015 Document Reviewed: 12/09/2014 Elsevier Interactive Patient Education  [employer].

## 2017-01-29 NOTE — Progress Notes (Deleted)
   Subjective:    Patient ID: Valerie [name], female    DOB: 1960-07-29, 56 y.o.   MRN: [id number]   Chief Complaint  Patient presents with  . Follow-up   HPI  HTN: spironolactone but now has weaned back up to 50mg  (was on 100mg ) and the benazepril has been cut to 20mg .   Review of Systems     Objective:   Physical Exam       BP 122/84   Pulse 71   Temp 98.2 F (36.8 C) (Oral)   Resp 18   Ht 5\' 4"  (1.626 m)   Wt 289 lb (131.1 kg)   SpO2 97%   BMI 49.61 kg/m   Assessment & Plan:    Delman Cheadle, M.D.  Primary Care at [facility] [address] [place], [place] [address] [phone] phone [phone] fax  01/31/17 11:29 PM

## 2017-01-29 NOTE — Progress Notes (Signed)
Subjective:    Patient ID: Valerie [name], female    DOB: [birth year], 56 y.o.   MRN: [id number] Chief Complaint  Patient presents with  . Follow-up    HPI   HTN: spironolactone but now has weaned back up to 50mg  (was on 100mg ) and the benazepril has been cut to 20mg .  Had labs done at [facility] with Dr. Eber Hong. Cr down to 1.0 with prot 7, albumin 4.2. LFTs nml, cbc with diff normal.  Has a 8 lbs weight variatio nfrom one day to the next which she is attributing to fliud shifts.  Had lasix to bring it down.   spironolactone but now has weaned back up to 50mg  (was on 100mg ) and the benazepril has been cut to 20mg .   MPO ab elev at 5.3 <-4.5 < 5 last done 10/02/16 (<1 nml)  Moving up appt with Dr. Eber Hong as she has developed joints that have frozen up randomly - she'll wake up in the morning with torticollis, rhomboid fixation, frozen shoulder, fixed ribs upon awakening. Tends to have a lot of problems around T3 that sometimes will wake her up from sleep but last week came to the point where she could hardly get out of bed, can't take a deep breath without severe pain. Her PT Jenny Reichmann will be able to manually loosen/unlock the joints though might only last for a few days.  She will go for a while without any problems but then just randomly she will wake up with a locked up joint.  About a year ago was occurring just several times sporadically but then over the past 6 months has been increasing in frequency then really noticed over the past week has had a lot of problems.  She has worked with PT to make sure she has a really supportive good sleeping position/bed and does the appropriate stretching.    Mentally better perhaps with fatigue improving on the methotrexate which has been a good thin.  Baclofen 20mg  prn.  Night sweats improving.  Nausea improving.   Lasix is being taken for several days and then randomly.   Past Medical History:  Diagnosis Date  . Acute ischemic colitis ([place])   .  Anemia 05/01/2012  . Anxiety   . ARF (acute renal failure) ([place]) 12/28/2015  . Arthritis    "basically ~ q joint" (12/28/2015)  . Asthma   . Basal cell carcinoma of upper back ~ 2011  . DDD (degenerative disc disease), lumbar   . Diarrhea    EPISODIC  . Fibromyalgia   . First degree heart block   . Gastro-esophageal reflux   . Headache(784.0)    "muscular w/migraine intensity; ~ 5-6 months" (12/28/2015)  . Hypertension   . Mental disorder    reports PTSD  . Microscopic polyangiitis ([place])   . Nerve pain    nerve damage to right arm  . Neuromuscular disorder ([place])   . Pancreatitis   . Pneumonia ~ 2014; 08/2015  . Polymyalgia rheumatica ([place]) onset 08/2015   changed diagnosis to MPA  . Precancerous melanosis    "2nd toe left foot; 5th toe of right foot; right temple; had them all cut off"  . Sleep apnea    CPAP-9   Past Surgical History:  Procedure Laterality Date  . BASAL CELL CARCINOMA EXCISION  ~ 2011   back  . BOTOX INJECTION N/A 11/06/2012   Procedure: BOTOX INJECTION;  Surgeon: Missy Sabins, MD;  Location: [facility];  Service: Endoscopy;  Laterality:  N/A;  . BOTOX INJECTION N/A 12/04/2013   Procedure: BOTOX INJECTION;  Surgeon: Missy Sabins, MD;  Location: [facility];  Service: Endoscopy;  Laterality: N/A;  . BOTOX INJECTION N/A 02/26/2014   Procedure: BOTOX INJECTION;  Surgeon: Missy Sabins, MD;  Location: [facility];  Service: Endoscopy;  Laterality: N/A;  . BOTOX INJECTION N/A 07/08/2015   Procedure: BOTOX INJECTION;  Surgeon: Teena Irani, MD;  Location: [facility];  Service: Endoscopy;  Laterality: N/A;  . BOTOX INJECTION N/A 04/27/2016   Procedure: BOTOX INJECTION;  Surgeon: Teena Irani, MD;  Location: [facility];  Service: Endoscopy;  Laterality: N/A;  . ESOPHAGOGASTRODUODENOSCOPY  12/21/2011   Procedure: ESOPHAGOGASTRODUODENOSCOPY (EGD);  Surgeon: Missy Sabins, MD;  Location[facility]COPY;  Service: Endoscopy;  Laterality: N/A;  . ESOPHAGOGASTRODUODENOSCOPY N/A  11/06/2012   Procedure: ESOPHAGOGASTRODUODENOSCOPY (EGD);  Surgeon: Missy Sabins, MD;  Location: [facility];  Service: Endoscopy;  Laterality: N/A;  . ESOPHAGOGASTRODUODENOSCOPY N/A 12/04/2013   Procedure: ESOPHAGOGASTRODUODENOSCOPY (EGD);  Surgeon: Missy Sabins, MD;  Location: [facility] ENDOSCOPY;  Service: Endoscopy;  Laterality: N/A;  . ESOPHAGOGASTRODUODENOSCOPY N/A 02/26/2014   Procedure: ESOPHAGOGASTRODUODENOSCOPY (EGD);  Surgeon: Missy Sabins, MD;  Location: [facility] ENDOSCOPY;  Service: Endoscopy;  Laterality: N/A;  . ESOPHAGOGASTRODUODENOSCOPY (EGD) WITH PROPOFOL N/A 07/08/2015   Procedure: ESOPHAGOGASTRODUODENOSCOPY (EGD) WITH PROPOFOL;  Surgeon: Teena Irani, MD;  Location: [facility];  Service: Endoscopy;  Laterality: N/A;  . ESOPHAGOGASTRODUODENOSCOPY (EGD) WITH PROPOFOL N/A 04/27/2016   Procedure: ESOPHAGOGASTRODUODENOSCOPY (EGD) WITH PROPOFOL;  Surgeon: Teena Irani, MD;  Location: [facility];  Service: Endoscopy;  Laterality: N/A;  . FLEXIBLE SIGMOIDOSCOPY  05/01/2012   Procedure: FLEXIBLE SIGMOIDOSCOPY;  Surgeon: Jeryl Columbia, MD;  Location: [facility];  Service: Endoscopy;  Laterality: N/A;  unprepped  . FLEXIBLE SIGMOIDOSCOPY Left 09/28/2016   Procedure: FLEXIBLE SIGMOIDOSCOPY;  Surgeon: Wilford Corner, MD;  Location: [facility];  Service: Endoscopy;  Laterality: Left;  . LAPAROSCOPIC CHOLECYSTECTOMY  01/2002  . NASAL SEPTOPLASTY W/ TURBINOPLASTY  1998  . SHOULDER ARTHROSCOPY WITH ROTATOR CUFF REPAIR AND OPEN BICEPS TENODESIS Bilateral 2010-2013   left-right   Current Outpatient Prescriptions on File Prior to Visit  Medication Sig Dispense Refill  . allopurinol (ZYLOPRIM) 300 MG tablet Take 0.5 tablets (150 mg total) by mouth daily. 90 tablet 1  . ALPRAZolam (XANAX) 1 MG tablet Take 1 tablet (1 mg total) by mouth 3 (three) times daily as needed for anxiety. 90 tablet 1  . benazepril (LOTENSIN) 40 MG tablet Take 1 tablet (40 mg total) by mouth daily. (Patient taking differently: Take 20  mg by mouth daily. ) 90 tablet 3  . calcium carbonate (OSCAL) 1500 (600 Ca) MG TABS tablet Take 600 mg of elemental calcium by mouth daily with breakfast.    . cetirizine (ZYRTEC) 10 MG tablet Take 10 mg by mouth daily. Alternates between Allegra and Zyrtec as needed for allergies    . Cholecalciferol (VITAMIN D3) 5000 units CAPS Take 1 capsule by mouth.    . dexlansoprazole (DEXILANT) 60 MG capsule Take 60 mg by mouth 2 (two) times daily.    . fexofenadine (ALLEGRA) 180 MG tablet Take 180 mg by mouth daily as needed. Switches between Allegra and Zyrtec as needed for allergies    . fluticasone (FLONASE) 50 MCG/ACT nasal spray Place 2 sprays into both nostrils daily. 48 g 2  . folic acid (FOLVITE) 1 MG tablet Take 1 mg by mouth daily.    . furosemide (LASIX) 20 MG tablet Take 1 tablet (20  mg total) by mouth daily. As needed For edema 30 tablet 1  . HYDROcodone-acetaminophen (NORCO) 10-325 MG per tablet Take 1-2 tablets by mouth every 6 (six) hours as needed for moderate pain (pain.).     Marland Kitchen hydrOXYzine (ATARAX/VISTARIL) 10 MG tablet Take 1-3 tablets (10-30 mg total) by mouth 3 (three) times daily as needed. 90 tablet 1  . methotrexate (RHEUMATREX) 2.5 MG tablet Take 2.5 mg by mouth 3 (three) times daily. Caution:Chemotherapy. Protect from light.    . metoprolol succinate (TOPROL-XL) 100 MG 24 hr tablet Take 1 tablet (100 mg total) by mouth daily. Take with or immediately following a meal. 90 tablet 3  . modafinil (PROVIGIL) 200 MG tablet Take 0.5 tablets (100 mg total) by mouth 2 (two) times daily. 90 tablet 1  . montelukast (SINGULAIR) 10 MG tablet TAKE 1 TABLET (10 MG TOTAL) BY MOUTH AT BEDTIME 90 tablet 3  . morphine (MS CONTIN) 30 MG 12 hr tablet Take 30 mg by mouth every 8 (eight) hours. Not from [place]    . Multiple Vitamins-Minerals (MULTIVITAMIN PO) Take 1 tablet by mouth daily.    . Olopatadine HCl (PAZEO) 0.7 % SOLN Apply 1 drop to eye daily. 2.5 mL 11  . ondansetron (ZOFRAN) 4 MG tablet Take  1 tablet (4 mg total) by mouth every 8 (eight) hours as needed for nausea or vomiting. 20 tablet 0  . Pancrelipase, Lip-Prot-Amyl, (CREON) 24000 UNITS CPEP Take 72,000 Units by mouth 3 (three) times daily as needed (for pancreatitis).     Marland Kitchen PROAIR HFA 108 (90 Base) MCG/ACT inhaler INHALE 2 PUFFS INTO THE LUNGS AS NEEDED FOR WHEEZING OR SHORTNESS OF BREATH. 8.5 each 0  . promethazine (PHENERGAN) 25 MG tablet Take 1 tablet (25 mg total) by mouth every 6 (six) hours as needed for nausea or vomiting. 30 tablet 1  . spironolactone (ALDACTONE) 100 MG tablet Take 1 tablet (100 mg total) by mouth daily. (Patient taking differently: Take 50 mg by mouth daily. ) 90 tablet 3  . sucralfate (CARAFATE) 1 GM/10ML suspension Take 10 mLs (1 g total) by mouth 4 (four) times daily as needed (stomach pain). 420 mL 1   No current facility-administered medications on file prior to visit.    Allergies  Allergen Reactions  . Dilaudid [Hydromorphone Hcl] Other (See Comments)    Headache   . Pregabalin Other (See Comments)    Drug induced stupor  . Metoclopramide Hcl Other (See Comments)    "crawling inside"  . Neurontin [Gabapentin] Other (See Comments)    Tremors   . Other Other (See Comments)    Avoid all SSRI's and SNRI's- damaged pancreas  . Serotonin Reuptake Inhibitors (Ssris) Other (See Comments)    Damage to pancreas and bowel/urinary incontinence    Family History  Problem Relation Age of Onset  . Heart disease Mother   . COPD Mother   . Arthritis Mother   . Cancer Mother   . Hypertension Mother   . Arthritis Paternal Grandmother   . Heart disease Paternal Grandmother   . Heart disease Paternal Grandfather    Social History   Social History  . Marital status: Single    Spouse name: N/A  . Number of children: 0  . Years of education: College   Occupational History  . disabled    Social History Main Topics  . Smoking status: Never Smoker  . Smokeless tobacco: Never Used  . Alcohol use  No  . Drug use: No  .  Sexual activity: Not Currently   Other Topics Concern  . None   Social History Narrative   Caffeine 2 cups iced tea daily avg.   Depression screen [facility] 2/9 01/29/2017 11/25/2016 11/17/2016 10/23/2016 10/09/2016  Decreased Interest 0 1 2 0 0  Down, Depressed, Hopeless 0 1 2 0 0  PHQ - 2 Score 0 2 4 0 0  Altered sleeping - 0 2 - -  Tired, decreased energy - 0 2 - -  Change in appetite - 0 2 - -  Feeling bad or failure about yourself  - 0 1 - -  Trouble concentrating - 0 2 - -  Moving slowly or fidgety/restless - 0 1 - -  Suicidal thoughts - 0 0 - -  PHQ-9 Score - 2 14 - -  Difficult doing work/chores - Somewhat difficult Very difficult - -  Some recent data might be hidden    Review of Systems See hpi    Objective:   Physical Exam  Constitutional: She is oriented to person, place, and time. She appears well-developed and well-nourished. No distress.  HENT:  Head: Normocephalic and atraumatic.  Right Ear: External ear normal.  Left Ear: External ear normal.  Eyes: Conjunctivae are normal. No scleral icterus.  Neck: Normal range of motion. Neck supple. No thyromegaly present.  Cardiovascular: Normal rate, regular rhythm, normal heart sounds and intact distal pulses.   Pulmonary/Chest: Effort normal and breath sounds normal. No respiratory distress.  Musculoskeletal: She exhibits no edema.  Lymphadenopathy:    She has no cervical adenopathy.  Neurological: She is alert and oriented to person, place, and time.  Skin: Skin is warm and dry. She is not diaphoretic. No erythema.  Psychiatric: She has a normal mood and affect. Her behavior is normal.   BP 122/84   Pulse 71   Temp 98.2 F (36.8 C) (Oral)   Resp 18   Ht 5\' 4"  (1.626 m)   Wt 289 lb (131.1 kg)   SpO2 97%   BMI 49.61 kg/m    Assessment & Plan:   1. Secondary hypertension   2. Microscopic polyangiitis ([place])   3. CKD (chronic kidney disease) stage 3, GFR 30-59 ml/min   4. Fibromyalgia   5.  Cortisol deficiency ([place]) - appt with endocrine for repeat testing since off the prednisone for >30 d next week  6. Multiple stiff joints - seeing PT (declines info about craniosacral therapy).  7. Edema, unspecified type - rec continuing lasix if gaines >3 lbs from base weight o/n or 5 lbs in 1 wk - then take until gets down to base weight.      Delman Cheadle, M.D.  Primary Care at [facility] [address] [place], [place] [address] [phone] phone [phone] fax  01/31/17 11:39 PM

## 2017-01-31 DIAGNOSIS — M2560 Stiffness of unspecified joint, not elsewhere classified: Secondary | ICD-10-CM | POA: Insufficient documentation

## 2017-01-31 DIAGNOSIS — E2749 Other adrenocortical insufficiency: Secondary | ICD-10-CM | POA: Insufficient documentation

## 2017-02-02 DIAGNOSIS — M542 Cervicalgia: Secondary | ICD-10-CM | POA: Diagnosis not present

## 2017-02-06 ENCOUNTER — Encounter: Payer: Self-pay | Admitting: Family Medicine

## 2017-02-06 DIAGNOSIS — E279 Disorder of adrenal gland, unspecified: Secondary | ICD-10-CM | POA: Diagnosis not present

## 2017-02-06 DIAGNOSIS — E233 Hypothalamic dysfunction, not elsewhere classified: Secondary | ICD-10-CM | POA: Diagnosis not present

## 2017-02-06 DIAGNOSIS — R768 Other specified abnormal immunological findings in serum: Secondary | ICD-10-CM | POA: Diagnosis not present

## 2017-02-06 DIAGNOSIS — T380X5D Adverse effect of glucocorticoids and synthetic analogues, subsequent encounter: Secondary | ICD-10-CM | POA: Diagnosis not present

## 2017-02-07 DIAGNOSIS — M542 Cervicalgia: Secondary | ICD-10-CM | POA: Diagnosis not present

## 2017-02-09 DIAGNOSIS — M542 Cervicalgia: Secondary | ICD-10-CM | POA: Diagnosis not present

## 2017-02-13 DIAGNOSIS — M542 Cervicalgia: Secondary | ICD-10-CM | POA: Diagnosis not present

## 2017-02-19 ENCOUNTER — Encounter: Payer: Self-pay | Admitting: Family Medicine

## 2017-02-19 ENCOUNTER — Ambulatory Visit (INDEPENDENT_AMBULATORY_CARE_PROVIDER_SITE_OTHER): Payer: Medicare Other | Admitting: Family Medicine

## 2017-02-19 VITALS — BP 135/85 | HR 71 | Temp 98.0°F | Resp 18 | Ht 64.0 in | Wt 288.8 lb

## 2017-02-19 DIAGNOSIS — Z23 Encounter for immunization: Secondary | ICD-10-CM

## 2017-02-19 DIAGNOSIS — I159 Secondary hypertension, unspecified: Secondary | ICD-10-CM | POA: Diagnosis not present

## 2017-02-19 DIAGNOSIS — M317 Microscopic polyangiitis: Secondary | ICD-10-CM

## 2017-02-19 DIAGNOSIS — E2749 Other adrenocortical insufficiency: Secondary | ICD-10-CM | POA: Diagnosis not present

## 2017-02-19 NOTE — Patient Instructions (Signed)
     IF you received an x-ray today, you will receive an invoice from [facility]. Please contact [facility] at [phone] with questions or concerns regarding your invoice.   IF you received labwork today, you will receive an invoice from [facility]. Please contact [facility] at [phone] with questions or concerns regarding your invoice.   Our billing staff will not be able to assist you with questions regarding bills from these companies.  You will be contacted with the lab results as soon as they are available. The fastest way to get your results is to activate your My Chart account. Instructions are located on the last page of this paperwork. If you have not heard from us regarding the results in 2 weeks, please contact this office.     

## 2017-02-19 NOTE — Progress Notes (Signed)
Subjective:  By signing my name below, I, Valerie [name], attest that this documentation has been prepared under the direction and in the presence of Valerie Cheadle, MD. Electronically Signed: Moises [name], [place]. 02/19/2017 , 2:21 PM .  Patient was seen in Room 2 .   Patient ID: Valerie [name], female    DOB: February 25, 1961, 56 y.o.   MRN: [id number] Chief Complaint  Patient presents with  . Hypertension  . Follow-up   HPI Valerie [name] is a 56 y.o. female who presents to [facility] Care at [name] for follow up.   HTN She is taking spironolactone 50mg  and benazepril 20mg  with PRN lasix which is taken if weight increases more than 3 lbs over night or 5 lbs in 1 week, until weight returns to normal. Unable to increase spironolactone due to hyperkalemia.   She's been checking her BP at home, running 120s/70s-80s. She denies any side effects or known issues with her medications.   Psoriasis She states still having a small patch of fine blisters over her left wrist. She informs using a cream for this and still has some available.   Immunization: She received a flu shot today.   Microscopic polyangiitis due to MPO antibody elevation Last done 10/02/16 at 5.3 with <1 normal. She follows up with [name] at [name]. She's been on low dose prednisone for several years but had to come off due to work up of cortisol suppression, so started on methotrexate which has helped some. She would have very sudden onset of very stiff joints with unknown reasons, increasing in frequency, and closely working with physical therapy. She's taking baclofen 20mg  prn.    Cortisol suppression She had follow up with endocrinologist, [name], 2 weeks prior. She has been off of prednisone since 7/20. [name] thought that the prednisone likely was partially suppressing her HPA axis. Advised that any testing that the HPA axis is misleading still.   Her Cortrosyn Stimulation test is  scheduled for 9/18. [name] noted okay to resume prednisone if rheumatologic indication, and increase if stress coverage needed for acute illness. Patient plans for [name] 2nd opinion scheduled for 10/8, and [name] nephrology 2nd opinion scheduled for 9/20. She will see [name] on 9/26.   Patient reports that she hasn't restarted her prednisone. She also contacted [name] and recommended not to restart prednisone. She confirms Controsyn Stimulation test next Tuesday, Sept 18th.    Mood She notes her mood "hasn't been the best in the world". She's trying her best. She mentions trouble sleeping at night, but relief with xanax or hydroxyzine.   She mentions having night sweats, and unsure why it's returned. She didn't have any while on prednisone. Her last visit with [name] was on 01/26/17.   She also complains of lower leg pain worsening, more so over the front of her shins. She denies any rash, swelling or redness over the area.   Past Medical History:  Diagnosis Date  . Acute ischemic colitis ([place])   . Anemia 05/01/2012  . Anxiety   . ARF (acute renal failure) ([place]) 12/28/2015  . Arthritis    "basically ~ q joint" (12/28/2015)  . Asthma   . Basal cell carcinoma of upper back ~ 2011  . DDD (degenerative disc disease), lumbar   . Diarrhea    EPISODIC  . Fibromyalgia   . First degree heart block   . Gastro-esophageal reflux   . Headache(784.0)    "muscular  w/migraine intensity; ~ 5-6 months" (12/28/2015)  . Hypertension   . Mental disorder    reports PTSD  . Microscopic polyangiitis ([place])   . Nerve pain    nerve damage to right arm  . Neuromuscular disorder ([place])   . Pancreatitis   . Pneumonia ~ 2014; 08/2015  . Polymyalgia rheumatica ([place]) onset 08/2015   changed diagnosis to MPA  . Precancerous melanosis    "2nd toe left foot; 5th toe of right foot; right temple; had them all cut off"  . Sleep apnea    CPAP-9   Prior to Admission medications     Medication Sig Start Date End Date Taking? Authorizing Provider  allopurinol (ZYLOPRIM) 300 MG tablet Take 0.5 tablets (150 mg total) by mouth daily. 07/31/16   Valerie Knapp, MD  ALPRAZolam Duanne Moron) 1 MG tablet Take 1 tablet (1 mg total) by mouth 3 (three) times daily as needed for anxiety. 12/08/16   Valerie Knapp, MD  benazepril (LOTENSIN) 40 MG tablet Take 1 tablet (40 mg total) by mouth daily. Patient taking differently: Take 20 mg by mouth daily.  02/18/16   Valerie Russian, MD  calcium carbonate (OSCAL) 1500 (600 Ca) MG TABS tablet Take 600 mg of elemental calcium by mouth daily with breakfast.    [provider]  cetirizine (ZYRTEC) 10 MG tablet Take 10 mg by mouth daily. Alternates between Allegra and Zyrtec as needed for allergies    [provider]  Cholecalciferol (VITAMIN D3) 5000 units CAPS Take 1 capsule by mouth.    [provider]  dexlansoprazole (DEXILANT) 60 MG capsule Take 60 mg by mouth 2 (two) times daily.    [provider]  fexofenadine (ALLEGRA) 180 MG tablet Take 180 mg by mouth daily as needed. Switches between Allegra and Zyrtec as needed for allergies    [provider]  fluticasone (FLONASE) 50 MCG/ACT nasal spray Place 2 sprays into both nostrils daily. 07/31/16   Valerie Knapp, MD  folic acid (FOLVITE) 1 MG tablet Take 1 mg by mouth daily. 10/10/16   [provider]  furosemide (LASIX) 20 MG tablet Take 1 tablet (20 mg total) by mouth daily. As needed For edema 01/11/17   Valerie Knapp, MD  HYDROcodone-acetaminophen [facility]) 10-325 MG per tablet Take 1-2 tablets by mouth every 6 (six) hours as needed for moderate pain (pain.).     [provider]  hydrOXYzine (ATARAX/VISTARIL) 10 MG tablet Take 1-3 tablets (10-30 mg total) by mouth 3 (three) times daily as needed. 12/08/16   Valerie Knapp, MD  methotrexate (RHEUMATREX) 2.5 MG tablet Take 2.5 mg by mouth 3 (three) times daily. Caution:Chemotherapy. Protect from light.     [provider]  metoprolol succinate (TOPROL-XL) 100 MG 24 hr tablet Take 1 tablet (100 mg total) by mouth daily. Take with or immediately following a meal. 07/31/16   Valerie Knapp, MD  modafinil (PROVIGIL) 200 MG tablet Take 0.5 tablets (100 mg total) by mouth 2 (two) times daily. 10/11/16   Dohmeier, Asencion Partridge, MD  montelukast (SINGULAIR) 10 MG tablet TAKE 1 TABLET (10 MG TOTAL) BY MOUTH AT BEDTIME 07/31/16   Valerie Knapp, MD  morphine (MS CONTIN) 30 MG 12 hr tablet Take 30 mg by mouth every 8 (eight) hours. Not from [facility] 09/23/12   [provider]  Multiple Vitamins-Minerals (MULTIVITAMIN PO) Take 1 tablet by mouth daily.    [provider]  Olopatadine HCl (PAZEO) 0.7 % SOLN Apply  1 drop to eye daily. 08/02/16   Valerie Knapp, MD  ondansetron (ZOFRAN) 4 MG tablet Take 1 tablet (4 mg total) by mouth every 8 (eight) hours as needed for nausea or vomiting. 11/18/16   Tereasa Coop, PA-C  Pancrelipase, Lip-Prot-Amyl, (CREON) 24000 UNITS CPEP Take 72,000 Units by mouth 3 (three) times daily as needed (for pancreatitis).     [provider]  PROAIR HFA 108 [phone] Base) MCG/ACT inhaler INHALE 2 PUFFS INTO THE LUNGS AS NEEDED FOR WHEEZING OR SHORTNESS OF BREATH. 05/29/16   Valerie Knapp, MD  promethazine (PHENERGAN) 25 MG tablet Take 1 tablet (25 mg total) by mouth every 6 (six) hours as needed for nausea or vomiting. 11/18/16   Tereasa Coop, PA-C  spironolactone (ALDACTONE) 100 MG tablet Take 1 tablet (100 mg total) by mouth daily. Patient taking differently: Take 50 mg by mouth daily.  07/31/16   Valerie Knapp, MD  sucralfate (CARAFATE) 1 GM/10ML suspension Take 10 mLs (1 g total) by mouth 4 (four) times daily as needed (stomach pain). 07/31/16   Valerie Knapp, MD   Allergies  Allergen Reactions  . Dilaudid [Hydromorphone Hcl] Other (See Comments)    Headache   . Pregabalin Other (See Comments)    Drug induced stupor  . Metoclopramide Hcl Other (See Comments)    "crawling inside"    . Neurontin [Gabapentin] Other (See Comments)    Tremors   . Other Other (See Comments)    Avoid all SSRI's and SNRI's- damaged pancreas  . Serotonin Reuptake Inhibitors (Ssris) Other (See Comments)    Damage to pancreas and bowel/urinary incontinence    Past Surgical History:  Procedure Laterality Date  . BASAL CELL CARCINOMA EXCISION  ~ 2011   back  . BOTOX INJECTION N/A 11/06/2012   Procedure: BOTOX INJECTION;  Surgeon: Missy Sabins, MD;  Location: [facility];  Service: Endoscopy;  Laterality: N/A;  . BOTOX INJECTION N/A 12/04/2013   Procedure: BOTOX INJECTION;  Surgeon: Missy Sabins, MD;  Location: [facility];  Service: Endoscopy;  Laterality: N/A;  . BOTOX INJECTION N/A 02/26/2014   Procedure: BOTOX INJECTION;  Surgeon: Missy Sabins, MD;  Location: [facility];  Service: Endoscopy;  Laterality: N/A;  . BOTOX INJECTION N/A 07/08/2015   Procedure: BOTOX INJECTION;  Surgeon: Teena Irani, MD;  Location: [facility];  Service: Endoscopy;  Laterality: N/A;  . BOTOX INJECTION N/A 04/27/2016   Procedure: BOTOX INJECTION;  Surgeon: Teena Irani, MD;  Location: [facility];  Service: Endoscopy;  Laterality: N/A;  . ESOPHAGOGASTRODUODENOSCOPY  12/21/2011   Procedure: ESOPHAGOGASTRODUODENOSCOPY (EGD);  Surgeon: Missy Sabins, MD;  [facility]COPY;  Service: Endoscopy;  Laterality: N/A;  . ESOPHAGOGASTRODUODENOSCOPY N/A 11/06/2012   Procedure: ESOPHAGOGASTRODUODENOSCOPY (EGD);  Surgeon: Missy Sabins, MD;  Location: [facility];  Service: Endoscopy;  Laterality: N/A;  . ESOPHAGOGASTRODUODENOSCOPY N/A 12/04/2013   Procedure: ESOPHAGOGASTRODUODENOSCOPY (EGD);  Surgeon: Missy Sabins, MD;  Location[facility] ENDOSCOPY;  Service: Endoscopy;  Laterality: N/A;  . ESOPHAGOGASTRODUODENOSCOPY N/A 02/26/2014   Procedure: ESOPHAGOGASTRODUODENOSCOPY (EGD);  Surgeon: Missy Sabins, MD;  Location[facility]COPY;  Service: Endoscopy;  Laterality: N/A;  . ESOPHAGOGASTRODUODENOSCOPY (EGD) WITH PROPOFOL N/A 07/08/2015    Procedure: ESOPHAGOGASTRODUODENOSCOPY (EGD) WITH PROPOFOL;  Surgeon: Teena Irani, MD;  [facility];  Service: Endoscopy;  Laterality: N/A;  . ESOPHAGOGASTRODUODENOSCOPY (EGD) WITH PROPOFOL N/A 04/27/2016   Procedure: ESOPHAGOGASTRODUODENOSCOPY (EGD) WITH PROPOFOL;  Surgeon: Teena Irani, MD;  [facility];  Service: Endoscopy;  Laterality: N/A;  . FLEXIBLE SIGMOIDOSCOPY  05/01/2012   Procedure: FLEXIBLE SIGMOIDOSCOPY;  Surgeon: Jeryl Columbia, MD;  Location: [facility];  Service: Endoscopy;  Laterality: N/A;  unprepped  . FLEXIBLE SIGMOIDOSCOPY Left 09/28/2016   Procedure: FLEXIBLE SIGMOIDOSCOPY;  Surgeon: Wilford Corner, MD;  Location: [facility];  Service: Endoscopy;  Laterality: Left;  . LAPAROSCOPIC CHOLECYSTECTOMY  01/2002  . NASAL SEPTOPLASTY W/ TURBINOPLASTY  1998  . SHOULDER ARTHROSCOPY WITH ROTATOR CUFF REPAIR AND OPEN BICEPS TENODESIS Bilateral 2010-2013   left-right   Family History  Problem Relation Age of Onset  . Heart disease Mother   . COPD Mother   . Arthritis Mother   . Cancer Mother   . Hypertension Mother   . Arthritis Paternal Grandmother   . Heart disease Paternal Grandmother   . Heart disease Paternal Grandfather    Social History   Social History  . Marital status: Single    Spouse name: N/A  . Number of children: 0  . Years of education: College   Occupational History  . disabled    Social History Main Topics  . Smoking status: Never Smoker  . Smokeless tobacco: Never Used  . Alcohol use No  . Drug use: No  . Sexual activity: Not Currently   Other Topics Concern  . None   Social History Narrative   Caffeine 2 cups iced tea daily avg.   Depression screen [facility] 2/9 02/19/2017 01/29/2017 11/25/2016 11/17/2016 10/23/2016  Decreased Interest 0 0 1 2 0  Down, Depressed, Hopeless 0 0 1 2 0  PHQ - 2 Score 0 0 2 4 0  Altered sleeping - - 0 2 -  Tired, decreased energy - - 0 2 -  Change in appetite - - 0 2 -  Feeling bad or failure  about yourself  - - 0 1 -  Trouble concentrating - - 0 2 -  Moving slowly or fidgety/restless - - 0 1 -  Suicidal thoughts - - 0 0 -  PHQ-9 Score - - 2 14 -  Difficult doing work/chores - - Somewhat difficult Very difficult -  Some recent data might be hidden    Review of Systems  Constitutional: Negative for chills, fatigue, fever and unexpected weight change.  Respiratory: Negative for cough, chest tightness and shortness of breath.   Cardiovascular: Negative for chest pain, palpitations and leg swelling.  Gastrointestinal: Negative for abdominal pain, [name] in stool, constipation, diarrhea, nausea and vomiting.  Musculoskeletal: Positive for myalgias. Negative for gait problem and joint swelling.  Skin: Positive for rash. Negative for wound.  Neurological: Negative for dizziness, syncope, weakness, light-headedness and headaches.  Psychiatric/Behavioral: Positive for sleep disturbance.       Objective:   Physical Exam  Constitutional: She is oriented to person, place, and time. She appears well-developed and well-nourished. No distress.  HENT:  Head: Normocephalic and atraumatic.  Eyes: Pupils are equal, round, and reactive to light. EOM are normal.  Neck: Neck supple.  Cardiovascular: Normal rate.   Pulmonary/Chest: Effort normal. No respiratory distress.  Musculoskeletal: Normal range of motion.  Neurological: She is alert and oriented to person, place, and time.  Skin: Skin is warm and dry.  0.5 x 1 cm fine plaque with central petechiae over left wrist  Psychiatric: She has a normal mood and affect. Her behavior is normal.  Nursing note and vitals reviewed.   BP (!) 145/89   Pulse 71   Temp 98 [name] (36.7 C) (Oral)   Resp 18  Ht 5\' 4"  (1.626 m)   Wt 288 lb 12.8 oz (131 kg)   SpO2 96%   BMI 49.57 kg/m      Assessment & Plan:   1. Adrenal suppression (HCC) - has cosyntropyn stim test next wk. Stay off prednisone until then.  2. Secondary hypertension   3. Need  for prophylactic vaccination and inoculation against influenza   4. Microscopic polyangiitis ([place])     Orders Placed This Encounter  Procedures  . Flu Vaccine QUAD 36+ mos IM     I personally performed the services described in this documentation, which was scribed in my presence. The recorded information has been reviewed and considered, and addended by me as needed.   [facility]erie [name], M.D.  Primary Care at [facility] [address] [place], [place] [address] [phone] phone [phone] fax  02/21/17 11:40 PM

## 2017-02-27 DIAGNOSIS — E2740 Unspecified adrenocortical insufficiency: Secondary | ICD-10-CM | POA: Diagnosis not present

## 2017-03-01 DIAGNOSIS — G43909 Migraine, unspecified, not intractable, without status migrainosus: Secondary | ICD-10-CM | POA: Diagnosis not present

## 2017-03-01 DIAGNOSIS — G4733 Obstructive sleep apnea (adult) (pediatric): Secondary | ICD-10-CM | POA: Diagnosis not present

## 2017-03-01 DIAGNOSIS — N183 Chronic kidney disease, stage 3 (moderate): Secondary | ICD-10-CM | POA: Diagnosis not present

## 2017-03-01 DIAGNOSIS — I129 Hypertensive chronic kidney disease with stage 1 through stage 4 chronic kidney disease, or unspecified chronic kidney disease: Secondary | ICD-10-CM | POA: Diagnosis not present

## 2017-03-01 DIAGNOSIS — G8929 Other chronic pain: Secondary | ICD-10-CM | POA: Diagnosis not present

## 2017-03-01 DIAGNOSIS — Z79899 Other long term (current) drug therapy: Secondary | ICD-10-CM | POA: Diagnosis not present

## 2017-03-01 DIAGNOSIS — K219 Gastro-esophageal reflux disease without esophagitis: Secondary | ICD-10-CM | POA: Diagnosis not present

## 2017-03-01 DIAGNOSIS — G894 Chronic pain syndrome: Secondary | ICD-10-CM | POA: Diagnosis not present

## 2017-03-01 DIAGNOSIS — Z885 Allergy status to narcotic agent status: Secondary | ICD-10-CM | POA: Diagnosis not present

## 2017-03-01 DIAGNOSIS — Z9989 Dependence on other enabling machines and devices: Secondary | ICD-10-CM | POA: Diagnosis not present

## 2017-03-01 DIAGNOSIS — Z888 Allergy status to other drugs, medicaments and biological substances status: Secondary | ICD-10-CM | POA: Diagnosis not present

## 2017-03-01 DIAGNOSIS — E790 Hyperuricemia without signs of inflammatory arthritis and tophaceous disease: Secondary | ICD-10-CM | POA: Diagnosis not present

## 2017-03-01 DIAGNOSIS — M797 Fibromyalgia: Secondary | ICD-10-CM | POA: Diagnosis not present

## 2017-03-01 DIAGNOSIS — Z6841 Body Mass Index (BMI) 40.0 and over, adult: Secondary | ICD-10-CM | POA: Diagnosis not present

## 2017-03-01 DIAGNOSIS — N179 Acute kidney failure, unspecified: Secondary | ICD-10-CM | POA: Diagnosis not present

## 2017-03-07 DIAGNOSIS — R768 Other specified abnormal immunological findings in serum: Secondary | ICD-10-CM | POA: Diagnosis not present

## 2017-03-07 DIAGNOSIS — Z79899 Other long term (current) drug therapy: Secondary | ICD-10-CM | POA: Diagnosis not present

## 2017-03-12 DIAGNOSIS — M542 Cervicalgia: Secondary | ICD-10-CM | POA: Diagnosis not present

## 2017-03-19 DIAGNOSIS — M797 Fibromyalgia: Secondary | ICD-10-CM | POA: Diagnosis not present

## 2017-03-19 DIAGNOSIS — I129 Hypertensive chronic kidney disease with stage 1 through stage 4 chronic kidney disease, or unspecified chronic kidney disease: Secondary | ICD-10-CM | POA: Diagnosis not present

## 2017-03-19 DIAGNOSIS — M79661 Pain in right lower leg: Secondary | ICD-10-CM | POA: Diagnosis not present

## 2017-03-19 DIAGNOSIS — Z79899 Other long term (current) drug therapy: Secondary | ICD-10-CM | POA: Diagnosis not present

## 2017-03-19 DIAGNOSIS — K861 Other chronic pancreatitis: Secondary | ICD-10-CM | POA: Diagnosis not present

## 2017-03-19 DIAGNOSIS — M79606 Pain in leg, unspecified: Secondary | ICD-10-CM | POA: Diagnosis not present

## 2017-03-19 DIAGNOSIS — N182 Chronic kidney disease, stage 2 (mild): Secondary | ICD-10-CM | POA: Diagnosis not present

## 2017-03-19 DIAGNOSIS — M1990 Unspecified osteoarthritis, unspecified site: Secondary | ICD-10-CM | POA: Diagnosis not present

## 2017-03-19 DIAGNOSIS — R21 Rash and other nonspecific skin eruption: Secondary | ICD-10-CM | POA: Diagnosis not present

## 2017-03-19 DIAGNOSIS — R768 Other specified abnormal immunological findings in serum: Secondary | ICD-10-CM | POA: Diagnosis not present

## 2017-03-19 DIAGNOSIS — M2550 Pain in unspecified joint: Secondary | ICD-10-CM | POA: Diagnosis not present

## 2017-03-19 DIAGNOSIS — M79662 Pain in left lower leg: Secondary | ICD-10-CM | POA: Diagnosis not present

## 2017-03-20 DIAGNOSIS — M79604 Pain in right leg: Secondary | ICD-10-CM | POA: Diagnosis not present

## 2017-03-20 DIAGNOSIS — M317 Microscopic polyangiitis: Secondary | ICD-10-CM | POA: Diagnosis not present

## 2017-03-20 DIAGNOSIS — F4312 Post-traumatic stress disorder, chronic: Secondary | ICD-10-CM | POA: Diagnosis not present

## 2017-03-20 DIAGNOSIS — Z8639 Personal history of other endocrine, nutritional and metabolic disease: Secondary | ICD-10-CM | POA: Diagnosis not present

## 2017-03-20 DIAGNOSIS — G4733 Obstructive sleep apnea (adult) (pediatric): Secondary | ICD-10-CM | POA: Diagnosis not present

## 2017-03-20 DIAGNOSIS — Z79891 Long term (current) use of opiate analgesic: Secondary | ICD-10-CM | POA: Diagnosis not present

## 2017-03-20 DIAGNOSIS — M461 Sacroiliitis, not elsewhere classified: Secondary | ICD-10-CM | POA: Diagnosis not present

## 2017-03-20 DIAGNOSIS — F332 Major depressive disorder, recurrent severe without psychotic features: Secondary | ICD-10-CM | POA: Diagnosis not present

## 2017-03-20 DIAGNOSIS — Z9989 Dependence on other enabling machines and devices: Secondary | ICD-10-CM | POA: Diagnosis not present

## 2017-03-20 DIAGNOSIS — Z8669 Personal history of other diseases of the nervous system and sense organs: Secondary | ICD-10-CM | POA: Diagnosis not present

## 2017-03-20 DIAGNOSIS — M79621 Pain in right upper arm: Secondary | ICD-10-CM | POA: Diagnosis not present

## 2017-03-20 DIAGNOSIS — K559 Vascular disorder of intestine, unspecified: Secondary | ICD-10-CM | POA: Diagnosis not present

## 2017-03-20 DIAGNOSIS — K30 Functional dyspepsia: Secondary | ICD-10-CM | POA: Diagnosis not present

## 2017-03-20 DIAGNOSIS — M79605 Pain in left leg: Secondary | ICD-10-CM | POA: Diagnosis not present

## 2017-03-20 DIAGNOSIS — F329 Major depressive disorder, single episode, unspecified: Secondary | ICD-10-CM | POA: Diagnosis not present

## 2017-03-20 DIAGNOSIS — E669 Obesity, unspecified: Secondary | ICD-10-CM | POA: Diagnosis not present

## 2017-03-20 DIAGNOSIS — M546 Pain in thoracic spine: Secondary | ICD-10-CM | POA: Diagnosis not present

## 2017-03-20 DIAGNOSIS — M545 Low back pain: Secondary | ICD-10-CM | POA: Diagnosis not present

## 2017-03-23 ENCOUNTER — Telehealth: Payer: Self-pay | Admitting: Family Medicine

## 2017-03-23 DIAGNOSIS — D485 Neoplasm of uncertain behavior of skin: Secondary | ICD-10-CM

## 2017-03-23 DIAGNOSIS — L409 Psoriasis, unspecified: Secondary | ICD-10-CM

## 2017-03-23 DIAGNOSIS — R21 Rash and other nonspecific skin eruption: Secondary | ICD-10-CM

## 2017-03-23 NOTE — Telephone Encounter (Signed)
Referral placed.

## 2017-03-23 NOTE — Telephone Encounter (Signed)
Pt called and left vm stated she would like a referral for dermatology to [facility] here in [place].Marland Kitchen  Please advise

## 2017-03-27 NOTE — Telephone Encounter (Signed)
Referral sent to [facility] in [place] on 10/16. Thanks!

## 2017-04-02 ENCOUNTER — Other Ambulatory Visit: Payer: Self-pay | Admitting: Gastroenterology

## 2017-04-02 DIAGNOSIS — R768 Other specified abnormal immunological findings in serum: Secondary | ICD-10-CM | POA: Diagnosis not present

## 2017-04-02 DIAGNOSIS — K3184 Gastroparesis: Secondary | ICD-10-CM | POA: Diagnosis not present

## 2017-04-06 ENCOUNTER — Encounter (HOSPITAL_COMMUNITY): Payer: Self-pay | Admitting: *Deleted

## 2017-04-06 ENCOUNTER — Other Ambulatory Visit: Payer: Self-pay | Admitting: Gastroenterology

## 2017-04-06 ENCOUNTER — Encounter: Payer: Self-pay | Admitting: Family Medicine

## 2017-04-06 DIAGNOSIS — K559 Vascular disorder of intestine, unspecified: Secondary | ICD-10-CM | POA: Diagnosis not present

## 2017-04-06 DIAGNOSIS — K219 Gastro-esophageal reflux disease without esophagitis: Secondary | ICD-10-CM

## 2017-04-06 DIAGNOSIS — M79606 Pain in leg, unspecified: Secondary | ICD-10-CM | POA: Diagnosis not present

## 2017-04-06 DIAGNOSIS — F1190 Opioid use, unspecified, uncomplicated: Secondary | ICD-10-CM | POA: Insufficient documentation

## 2017-04-06 DIAGNOSIS — R768 Other specified abnormal immunological findings in serum: Secondary | ICD-10-CM | POA: Diagnosis not present

## 2017-04-06 DIAGNOSIS — M2550 Pain in unspecified joint: Secondary | ICD-10-CM | POA: Diagnosis not present

## 2017-04-06 DIAGNOSIS — N182 Chronic kidney disease, stage 2 (mild): Secondary | ICD-10-CM | POA: Diagnosis not present

## 2017-04-06 HISTORY — DX: Gastro-esophageal reflux disease without esophagitis: K21.9

## 2017-04-06 NOTE — Progress Notes (Signed)
Spoke with pt for pre-op call. Pt denies cardiac disease. Takes Metoprolol for an irregular heart rate. Pt denies diabetes.   Pt has sleep apnea and uses a CPAP.  CXR - 11/18/16 - in EPIC EKG - 11/07/16 - in EPIC  Pt states she does not have anyone to stay with her for the 24 hours after her procedure. She states "I've done this plenty of times with no one staying with me and I do fine".

## 2017-04-09 ENCOUNTER — Ambulatory Visit (HOSPITAL_COMMUNITY): Payer: Medicare Other | Admitting: Anesthesiology

## 2017-04-09 ENCOUNTER — Encounter (HOSPITAL_COMMUNITY)
Admission: RE | Disposition: A | Discharge status: Home / Self Care | Payer: Self-pay | Source: Ambulatory Visit | Attending: Gastroenterology

## 2017-04-09 ENCOUNTER — Encounter (HOSPITAL_COMMUNITY): Payer: Self-pay | Admitting: Anesthesiology

## 2017-04-09 ENCOUNTER — Ambulatory Visit (HOSPITAL_COMMUNITY)
Admission: RE | Admit: 2017-04-09 | Discharge: 2017-04-09 | Disposition: A | Discharge status: Home / Self Care | Payer: Medicare Other | Source: Ambulatory Visit | Attending: Gastroenterology | Admitting: Gastroenterology

## 2017-04-09 DIAGNOSIS — Z79899 Other long term (current) drug therapy: Secondary | ICD-10-CM | POA: Diagnosis not present

## 2017-04-09 DIAGNOSIS — J45909 Unspecified asthma, uncomplicated: Secondary | ICD-10-CM | POA: Insufficient documentation

## 2017-04-09 DIAGNOSIS — K2951 Unspecified chronic gastritis with bleeding: Secondary | ICD-10-CM | POA: Diagnosis not present

## 2017-04-09 DIAGNOSIS — I10 Essential (primary) hypertension: Secondary | ICD-10-CM | POA: Insufficient documentation

## 2017-04-09 DIAGNOSIS — Z9049 Acquired absence of other specified parts of digestive tract: Secondary | ICD-10-CM | POA: Insufficient documentation

## 2017-04-09 DIAGNOSIS — K861 Other chronic pancreatitis: Secondary | ICD-10-CM | POA: Insufficient documentation

## 2017-04-09 DIAGNOSIS — K589 Irritable bowel syndrome without diarrhea: Secondary | ICD-10-CM | POA: Diagnosis not present

## 2017-04-09 DIAGNOSIS — F329 Major depressive disorder, single episode, unspecified: Secondary | ICD-10-CM | POA: Insufficient documentation

## 2017-04-09 DIAGNOSIS — Z7952 Long term (current) use of systemic steroids: Secondary | ICD-10-CM | POA: Insufficient documentation

## 2017-04-09 DIAGNOSIS — Z6841 Body Mass Index (BMI) 40.0 and over, adult: Secondary | ICD-10-CM | POA: Diagnosis not present

## 2017-04-09 DIAGNOSIS — Z791 Long term (current) use of non-steroidal anti-inflammatories (NSAID): Secondary | ICD-10-CM | POA: Diagnosis not present

## 2017-04-09 DIAGNOSIS — M797 Fibromyalgia: Secondary | ICD-10-CM | POA: Diagnosis not present

## 2017-04-09 DIAGNOSIS — K2950 Unspecified chronic gastritis without bleeding: Secondary | ICD-10-CM | POA: Diagnosis not present

## 2017-04-09 DIAGNOSIS — K219 Gastro-esophageal reflux disease without esophagitis: Secondary | ICD-10-CM | POA: Diagnosis not present

## 2017-04-09 DIAGNOSIS — K635 Polyp of colon: Secondary | ICD-10-CM | POA: Diagnosis not present

## 2017-04-09 DIAGNOSIS — G4730 Sleep apnea, unspecified: Secondary | ICD-10-CM | POA: Diagnosis not present

## 2017-04-09 DIAGNOSIS — F419 Anxiety disorder, unspecified: Secondary | ICD-10-CM | POA: Diagnosis not present

## 2017-04-09 DIAGNOSIS — K3184 Gastroparesis: Secondary | ICD-10-CM | POA: Diagnosis not present

## 2017-04-09 HISTORY — PX: ESOPHAGOGASTRODUODENOSCOPY: SHX5428

## 2017-04-09 HISTORY — DX: Other fatigue: R53.83

## 2017-04-09 HISTORY — DX: Headache: R51

## 2017-04-09 HISTORY — DX: Personal history of other diseases of the digestive system: Z87.19

## 2017-04-09 HISTORY — DX: Headache, unspecified: R51.9

## 2017-04-09 HISTORY — DX: Other chronic pain: G89.29

## 2017-04-09 HISTORY — PX: BOTOX INJECTION: SHX5754

## 2017-04-09 SURGERY — EGD (ESOPHAGOGASTRODUODENOSCOPY)
Anesthesia: Monitor Anesthesia Care

## 2017-04-09 MED ORDER — SODIUM CHLORIDE 0.9 % IV SOLN
INTRAVENOUS | Status: DC
  Administered 2017-04-09: 500 mL via INTRAVENOUS

## 2017-04-09 MED ORDER — PROPOFOL 500 MG/50ML IV EMUL
INTRAVENOUS | Status: DC | PRN
  Administered 2017-04-09: 75 ug/kg/min via INTRAVENOUS

## 2017-04-09 MED ORDER — SODIUM CHLORIDE 0.9 % IV SOLN
INTRAVENOUS | Status: DC
  Administered 2017-04-09: 10:00:00 via INTRAVENOUS

## 2017-04-09 MED ORDER — SODIUM CHLORIDE 0.9 % IJ SOLN
INTRAMUSCULAR | Status: DC | PRN
  Administered 2017-04-09: 4 mL via SUBMUCOSAL

## 2017-04-09 MED ORDER — SODIUM CHLORIDE 0.9 % IJ SOLN
100.0000 [IU] | Freq: Once | INTRAMUSCULAR | Status: DC
  Filled 2017-04-09: qty 100

## 2017-04-09 MED ORDER — ONDANSETRON HCL 4 MG/2ML IJ SOLN
INTRAMUSCULAR | Status: DC | PRN
  Administered 2017-04-09: 4 mg via INTRAVENOUS

## 2017-04-09 MED ORDER — LACTATED RINGERS IV SOLN: INTRAVENOUS | Status: DC

## 2017-04-09 MED ORDER — SODIUM CHLORIDE 0.9 % IJ SOLN
INTRAMUSCULAR | Status: AC
  Filled 2017-04-09: qty 10

## 2017-04-09 MED ORDER — BUTAMBEN-TETRACAINE-BENZOCAINE 2-2-14 % EX AERO
INHALATION_SPRAY | CUTANEOUS | Status: DC | PRN
  Administered 2017-04-09: 2 via TOPICAL

## 2017-04-09 MED ORDER — PROPOFOL 10 MG/ML IV BOLUS
INTRAVENOUS | Status: DC | PRN
  Administered 2017-04-09 (×2): 20 mg via INTRAVENOUS

## 2017-04-09 NOTE — Discharge Instructions (Signed)
Call if question or problem particularly if no benefit in the next few weeks and otherwise follow-up as needed with either myself or Dr. Amedeo Plenty   YOU HAD AN ENDOSCOPIC PROCEDURE TODAY: Refer to the procedure report and other information in the discharge instructions given to you for any specific questions about what was found during the examination. If this information does not answer your questions, please call [facility] office at [phone] to clarify.   YOU SHOULD EXPECT: Some feelings of bloating in the abdomen. Passage of more gas than usual. Walking can help get rid of the air that was put into your GI tract during the procedure and reduce the bloating. If you had a lower endoscopy (such as a colonoscopy or flexible sigmoidoscopy) you may notice spotting of blood in your stool or on the toilet paper. Some abdominal soreness may be present for a day or two, also.  DIET: Your first meal following the procedure should be a light meal and then it is ok to progress to your normal diet. A half-sandwich or bowl of soup is an example of a good first meal. Heavy or fried foods are harder to digest and may make you feel nauseous or bloated. Drink plenty of fluids but you should avoid alcoholic beverages for 24 hours. If you had a esophageal dilation, please see attached instructions for diet.   ACTIVITY: Your care partner should take you home directly after the procedure. You should plan to take it easy, moving slowly for the rest of the day. You can resume normal activity the day after the procedure however YOU SHOULD NOT DRIVE, use power tools, machinery or perform tasks that involve climbing or major physical exertion for 24 hours (because of the sedation medicines used during the test).   SYMPTOMS TO REPORT IMMEDIATELY: A gastroenterologist can be reached at any hour. Please call [phone]  for any of the following symptoms:  Following lower endoscopy (colonoscopy, flexible sigmoidoscopy) Excessive  amounts of blood in the stool  Significant tenderness, worsening of abdominal pains  Swelling of the abdomen that is new, acute  Fever of 100 or higher  Following upper endoscopy (EGD, EUS, ERCP, esophageal dilation) Vomiting of blood or coffee ground material  New, significant abdominal pain  New, significant chest pain or pain under the shoulder blades  Painful or persistently difficult swallowing  New shortness of breath  Black, tarry-looking or red, bloody stools  FOLLOW UP:  If any biopsies were taken you will be contacted by phone or by letter within the next 1-3 weeks. Call [phone]  if you have not heard about the biopsies in 3 weeks.  Please also call with any specific questions about appointments or follow up tests.

## 2017-04-09 NOTE — Transfer of Care (Signed)
Immediate Anesthesia Transfer of Care Note  Patient: Valerie [name]  Procedure(s) Performed: ESOPHAGOGASTRODUODENOSCOPY (EGD) (N/A ) BOTOX INJECTION (N/A )  Patient Location: Endoscopy Unit  Anesthesia Type:MAC  Level of Consciousness: awake, oriented and patient cooperative  Airway & Oxygen Therapy: Patient Spontanous Breathing and Patient connected to nasal cannula oxygen  Post-op Assessment: Report given to RN and Post -op Vital signs reviewed and stable  Post vital signs: Reviewed  Last Vitals:  Vitals:   04/09/17 0904  BP: 124/60  Pulse: 66  Resp: 12  Temp: 36.6 C  SpO2: 97%    Last Pain:  Vitals:   04/09/17 0904  TempSrc: Oral         Complications: No apparent anesthesia complications

## 2017-04-09 NOTE — Anesthesia Preprocedure Evaluation (Addendum)
Anesthesia Evaluation  Patient identified by MRN, date of birth, ID band Patient awake    Reviewed: Allergy & Precautions, NPO status , Patient's Chart, lab work & pertinent test results, reviewed documented beta blocker date and time   Airway Mallampati: II  TM Distance: >3 FB Neck ROM: Full    Dental no notable dental hx. (+) Teeth Intact, Dental Advisory Given   Pulmonary asthma , sleep apnea and Continuous Positive Airway Pressure Ventilation ,    breath sounds clear to auscultation       Cardiovascular hypertension, Pt. on medications and Pt. on home beta blockers + dysrhythmias  Rhythm:Regular Rate:Normal  ECG: SR, rate 69   Neuro/Psych Anxiety Depression PTSD Neuromuscular disease    GI/Hepatic GERD  Medicated and Controlled,Hx of colitis gastroparesis   Endo/Other  Morbid obesity  Renal/GU Renal InsufficiencyRenal disease     Musculoskeletal  (+) Arthritis , Fibromyalgia -, narcotic dependentChronic pain   Abdominal (+) + obese,   Peds  Hematology   Anesthesia Other Findings   Reproductive/Obstetrics                           Anesthesia Physical  Anesthesia Plan  ASA: III  Anesthesia Plan: MAC   Post-op Pain Management:    Induction: Intravenous  PONV Risk Score and Plan: 2 and Propofol infusion and Treatment may vary due to age or medical condition  Airway Management Planned: Natural Airway  Additional Equipment:   Intra-op Plan:   Post-operative Plan:   Informed Consent: I have reviewed the patients History and Physical, chart, labs and discussed the procedure including the risks, benefits and alternatives for the proposed anesthesia with the patient or authorized representative who has indicated his/her understanding and acceptance.   Dental advisory given  Plan Discussed with: CRNA  Anesthesia Plan Comments:         Anesthesia Quick Evaluation

## 2017-04-09 NOTE — Progress Notes (Signed)
Valerie [name] 10:07 AM  Subjective: Patient doing well without any problems since we last saw her recently in the office  Objective: Vital signs stable afebrile no acute distress exam please see preassessment evaluation  Assessment: Gastroparesis helped with Botox in the past  Plan: Okay to proceed with endoscopy with anesthesia assistance and probable Botox of her pylorus  [facility]  Pager [phone] After 5PM or if no answer call [phone]

## 2017-04-09 NOTE — Op Note (Signed)
[facility] Patient Name: Valerie [name] Procedure Date : 04/09/2017 MRN: [id number] Attending MD: Clarene Essex , MD Date of Birth: Jan 17, 1961 CSN: [id number] Age: 56 Admit Type: Outpatient Procedure:                Upper GI endoscopy Indications:              For therapy of gastroparesis Providers:                Clarene Essex, MD, Carmie End, RN, Cletis Athens,                            Technician Referring MD:              Medicines:                Propofol total dose 280 mg IV, Ondansetron 4 mg IV Complications:            No immediate complications. Estimated Blood Loss:     Estimated blood loss: none. Procedure:                Pre-Anesthesia Assessment:                           - Prior to the procedure, a History and Physical                            was performed, and patient medications and                            allergies were reviewed. The patient's tolerance of                            previous anesthesia was also reviewed. The risks                            and benefits of the procedure and the sedation                            options and risks were discussed with the patient.                            All questions were answered, and informed consent                            was obtained. Prior Anticoagulants: The patient has                            taken no previous anticoagulant or antiplatelet                            agents. ASA Grade Assessment: II - A patient with                            mild systemic disease. After reviewing the risks  and benefits, the patient was deemed in                            satisfactory condition to undergo the procedure.                           After obtaining informed consent, the endoscope was                            passed under direct vision. Throughout the                            procedure, the patient's blood pressure, pulse, and   oxygen saturations were monitored continuously. The                            EG-[id number] ([id number]) scope was introduced through the                            mouth, and advanced to the third part of duodenum.                            The upper GI endoscopy was accomplished without                            difficulty. The patient tolerated the procedure                            well. Scope In: Scope Out: Findings:      The larynx was normal.      The examined esophagus was normal.      Localized mild inflammation characterized by erythema was found in the       gastric antrum.      The prepyloric region of the stomach and pylorus were normal. Area was       successfully injected with 100 units botulinum toxin over 4 injections       in the customary fashion.      The duodenal bulb, first portion of the duodenum, second portion of the       duodenum and third portion of the duodenum were normal.      The exam was otherwise without abnormality. Impression:               - Normal larynx.                           - Normal esophagus.                           - Chronic gastritis.                           - Normal prepyloric region of the stomach and                            pylorus. Injected with botulinum toxin.                           -  Normal duodenal bulb, first portion of the                            duodenum, second portion of the duodenum and third                            portion of the duodenum.                           - The examination was otherwise normal.                           - No specimens collected. Moderate Sedation:      moderate sedation-none Recommendation:           - Patient has a contact number available for                            emergencies. The signs and symptoms of potential                            delayed complications were discussed with the                            patient. Return to normal activities tomorrow.                             Written discharge instructions were provided to the                            patient.                           - Soft diet today.                           - Continue present medications.                           - Return to GI clinic PRN.                           - Telephone GI clinic if symptomatic PRN. Procedure Code(s):        --- Professional ---                           [phone], Esophagogastroduodenoscopy, flexible,                            transoral; with directed submucosal injection(s),                            any substance Diagnosis Code(s):        --- Professional ---                           K29.50, Unspecified chronic gastritis without  bleeding                           K31.84, Gastroparesis CPT copyright 2016 [employer]. All rights reserved. The codes documented in this report are preliminary and upon coder review may  be revised to meet current compliance requirements. Clarene Essex, MD 04/09/2017 10:35:22 AM This report has been signed electronically. Number of Addenda: 0

## 2017-04-09 NOTE — Anesthesia Procedure Notes (Signed)
Procedure Name: MAC Date/Time: 04/09/2017 10:10 AM Performed by: Jenne Campus Pre-anesthesia Checklist: Patient identified, Emergency Drugs available, Suction available and Patient being monitored Oxygen Delivery Method: Simple face mask

## 2017-04-10 ENCOUNTER — Encounter (HOSPITAL_COMMUNITY): Payer: Self-pay | Admitting: Gastroenterology

## 2017-04-10 NOTE — Anesthesia Postprocedure Evaluation (Signed)
Anesthesia Post Note  Patient: Valerie [name]  Procedure(s) Performed: ESOPHAGOGASTRODUODENOSCOPY (EGD) (N/A ) BOTOX INJECTION (N/A )     Patient location during evaluation: Endoscopy Anesthesia Type: MAC Level of consciousness: awake and alert Pain management: pain level controlled Vital Signs Assessment: post-procedure vital signs reviewed and stable Respiratory status: spontaneous breathing, nonlabored ventilation, respiratory function stable and patient connected to nasal cannula oxygen Cardiovascular status: stable and blood pressure returned to baseline Postop Assessment: no apparent nausea or vomiting Anesthetic complications: no    Last Vitals:  Vitals:   04/09/17 1045 04/09/17 1055  BP: 107/64 120/69  Pulse: (!) 57 63  Resp: 14 (!) 21  Temp:    SpO2: 97% 100%    Last Pain:  Vitals:   04/09/17 1035  TempSrc: Oral                 [name] [name]

## 2017-04-22 NOTE — Progress Notes (Signed)
Subjective:    Patient ID: Valerie [name], female    DOB: [birth year], 56 y.o.   MRN: [id number]   Chief Complaint  Patient presents with  . Follow-up    HPI  Valerie [name] is a 56 yo woman here for a 2 mo follow-up on her numerous debilitating chronic medical conditions.  Lower leg pain: Bilateral deep constant aching pain below the knees initially started when she had persistent flu-like sxs 08/2015. Improves some on pred but nothing completely relieves it - constant. Sometimes radiates up to SI joints. Now moving to constant bad ache in shins. Also got partial relief on the MTX.  H/o SI joint dislocation after fall long ago - s/p injections. Shin pain partially responds to high dose steroids but not as well as other areas like SI joint or hands.  [facility] c/s Dr. Evette Cristal is checking ABIs to r/o vascular insufficiency - this unlikely to have a medium vessel vasculitis responsible for her leg pain but will start r/o this out by checking MRA abd to r/o medium vessel vasculitis there.   PolyArthralgias/DJD:  Hands hurt mostly. Will wake up with severe stiffness - joints require her to manually loosen/unlock them - moves around and certain places will only by locked up for a few days but no inciting etiology to it occurring and has been worsening over the past 9 mos. Rheum does not think any psoriatic arthritis - dry rash on palms/soles under-whelming. Referred pt to derm for their opinion. Working w/ PT - [facility] [employer].  Taking calcium 600 qd and vit D 5000u qd Chronic pain syndrome: Follows w/ pain mngmnt clinic on chronic narcotics - MS Contin 30 tid and hydrocodone 10-20 q6hrs. Baclofen 5m prn. Hyperuricemia: Stays on allopurinol 1546mqd as h/o worsening arthralgias in hands and feet when uric acid level is high but denies h/o gout.  Last uric acid 5.5 02/2016 Fibromyalgia: Dx 1998 - tenderness to outer thighs, arms, lower neck, trigger points.  Gabapentin caused muscle  spasms/tremors, pregabalin -> drug-induced stupor. Cervical DDD normal EMG/NCS in legs and XRs.    ?Microscopic polyangiitis: + low p-ANCA titer; +MPO: Found +p-ANCA (perinuclear antineutrophil cytoplasmic antibodies) 1:80 (12/2015) while in hosp with AKI. [facility] who found +MPO (myeloperoxidase antibodies). MPO ab elev at 5.3 <-4.5 < 5 last done 10/02/16 (<1 nml).   Tried trx for vasculitis w/ MTX 1059m initially quickly went off as coincidentally developed severe GI issues when her mother past but retried sev mos ago and pt noted it did help her palmar "psoriatic" rash, leg and hand pain (though not resolved by any means.) Last saw Dr. ThaGraylin Shiver26 Very atypical picture so Dr. ThaGraylin Shivernted second opinion so pt had [facility] c/s 1 mo ago - 10/8 with Dr. KaiEvette Cristalo did not see any sig disease to suggest underlying ANCA related vasculitis and didn't think the ischemic colitis episodes were necessary related to a small vessel vasculitis but will r/o a medium vessel vasculitis in the abd w/ a MRI/MRA abd w/ & w/o contrast. +TPO, normal TFTs, p-ANCA 1:80 (12/2015), neg ANA, dsDNA, ENA, RF, HCV, Scl70, SPEP, MPO 5.3 (09/2016, normal <1). ESR 14 11/2016. Recently normal UA, UPC, CXR.  Pt is supposed to f/u with [facility] early Dec to review results. Dr. SunNancy Fetterd propose increasing MTX to see if additional pain relief if no etiology of sxs are found after the additional  blood tests, ABIs, and MRA abd done 10/26 NORMAL.   At last visit w/ Dr. Graylin Shiver 03/07/17 she was on MTX 35TD wkly w/ folic acid 63m qd - MTX helps but not as much as the prednisone. However, we had to take pt off of prednisone 7/20 as it was causing partial suppression of the hypothalamic-pituitary-adrenal axis and concern that was exacerbating her fatigue as cortisol levels were very low. Pt had STIM test 02/27/2017 Adrenocorticotropic Hormon nml at 18 (nml 7.2-63) and cortisol 6.3 nml (nml  5.5-20.0);  Pt administered cosyntropin 0.270m30 min cortisol 20 mcg/dL 60 min cortisol 21.2 - I do not see any endocrinology Dr. Altheimer's interpretation of these results. . .  Pt reports the results were in the normal range.  Still off the prednisone.  Dr. ThGraylin Shiveras standing labs for pt every 8 wks to monitor the MTX. Has f/u with Dr. ThGraylin Shiver11/26 and with Dr. SuNancy Fetter 12/8. [facility] so at her next visit there are going to do more testing. Has not had any axial/spine imaging lately - MRI lumbar was 2013 (showed small far lateral bulge of the L2-3 disc into the left neural foramen and lateral to it w/o neural impingement.) Did have nml NCV/EMG of her lower extrimities 1 yr ago 04/28/2016.   She is now wondering if her Rt>Lt upper shin pain could be due to her back. While she was having workin on her mid-upper back at PT, her leg pain afterward almost disappeared and has been sig improved since.  Stiffness has progressively worsened - after sitting on the computer or when she awakes in the morning her hands will be very stiff for 10 min - more unbearable. She doesn't think the worsening is asstd to the colder weather. Never a problems until progressively worsening over the past 6-7 months.  CKDII: 2/2 scarring from prev AKI. Minimal proteinuria. [facility] who saw no renal abnml and no concern for renal vasculitis. [facility] by nephrology fellow who agreed that there were no sxs/concern for renal vasculitis. Avoids NSAIDS (only uses ibuprofen for severe rare migraines).   Severe fatigue: started around 08/2015 and worsened. pt adament that depression/mood d/o not exacerbating despite signs otherwise and refuses to consider any mood trx due to h/o pancreatitis 2/2 effexor.  Was having some adrenal suppression secondary to chronic prednisone use but felt MUCH worse off of the prednisone for  2 mos. Actually on provigil 10084mid from [facility]) for OSA and nocturnal eneuresis for many years but pt has paradoxical reaction to it - makes her tired - so her fatigue sxs actually got worse when we tried to increase this med in hopes of trx'ing the fatigue.  Gastroparesis: Had EGD by Dr. MagWatt Climes/29/2018 w/ botox injection to the pyloric area to try to trx.  Could not tolerate reglan -> made her feel like her insides were crawling Gastritis: Did see some mild localized gastritis on EGD 04/09/17 but overall upper GI area looked good. On dexilant.  TAKING CARAFATE SOMETIMES?? Chronic pancreatitis: from Effexor so will not take any ssri/snri now. Requires pancreatic enzymes intermittently.  2013; 09/2016: hosp for ischemic colitis with GI bleed - however, rheum reviewed biopsy reports which was NOT c/w ischemic colitis States she has "genetic marker for Crohn's". S/p cholecystecomy  [facility]: [facility] [place] - h/o  basal cell and 3 "pre-melanomas". ? Psoriasis - 03/2015 - dry small patchy rash of fine blisters on palms/wrists occ. Improved w/ methotrexate. Also on tops of feet. Trx'ed effectively w/ topical steroid cream. Has been told by prior providers that it was psoriasis. . .  Depression/Anxiety: Seeing therapist. Knows she is down but doesn't think it is worsening/triggering/etiology of any of her somatic sxs. Does have some insomnia treated with xanax or hydroxyzine 10-75m prn. On xanax 138mtid prn.  Has been trying to stay busy and hydroxyzine has helped with the flashbacks since her mother has died from her PTSD - truly prn - will go for several weeks w/o then need several days in a row. Will initially take an alprazolam and then it if doesn't work, adds in 1-2 hydroxyzine and does take during the day. The most she has ever taken of the alprazolam is bid. She tries not to use them at night for sleep but on the rare occasion it is necessary.   Tries melatonin for insomnia first.   Asthma: Zyrtec 10 or Allegra 180, flonase, singulair 10, pazeo eye gtts, - currently on the zyrtec. Has the pro-air at home but hasn't needed it recently. Has had several days of post-nasal drip  HTN/Edema: Well-controlled on toprol XL 100 qd, benazepril 2021mspironolactone 50, with lasix 19m61mn weight >3lbs o/n which she does need every few days at [facility] she take the lasix roughly qod. Unable to increase spironolactone due to hyperkalemia. Checks BP at home and well controlled - 120s/60-70s.  OSA: on CPAP. On provigil 100mg38m [facility] Dr. DoheiWestley HummerOSA and nocturnal enuresis  Morbid Obesity:  Past Medical History:  Diagnosis Date  . Acute ischemic colitis (HCC) [place] Anemia 05/01/2012  . Anxiety   . ARF (acute renal failure) (HCC) [place]8/2017  . Arthritis    "basically ~ q joint" (12/28/2015)  . Asthma   . Basal cell carcinoma of upper back ~ 2011  . Chronic right-sided headaches    and neck pain, relieved wiht PT and chronic practic manipulation  . DDD (degenerative disc disease), lumbar   . Diarrhea    EPISODIC  . Fatigue    h/o excessive sedation, etiology unknown  . Fibromyalgia   . First degree heart block   . GERD (gastroesophageal reflux disease) 04/06/2017  . Headache(784.0)    "muscular w/migraine intensity; ~ 5-6 months" (12/28/2015)  . History of ischemic colitis 09/24/2016  . Hypertension   . Mental disorder    reports PTSD  . Microscopic polyangiitis (HCC) [place] Nerve pain 2011   nerve damage to right arm; normal EMG/NCV to RUExt after shoulder surgery 12/2009  . Neuromuscular disorder (HCC) [place] Pancreatitis    due to Effexor  . Pneumonia ~ 2014; 08/2015   [place]  . Polymyalgia rheumatica (HCC) [place] 08/2015   changed diagnosis to MPA  . Precancerous melanosis (HCC) [place]"2nd toe left foot; 5th toe of right foot; right temple; had them all cut off"  . Sleep apnea    CPAP-9     Past Surgical History:  Procedure Laterality Date  . BASAL CELL CARCINOMA EXCISION  ~ 2011   back  . LAPAROSCOPIC CHOLECYSTECTOMY  01/2002  . NASAL SEPTOPLASTY W/ TURBINOPLASTY  1998  . SHOULDER ARTHROSCOPY WITH ROTATOR CUFF REPAIR AND OPEN BICEPS TENODESIS Bilateral 2010-2013   left-right   Current Outpatient Medications on File Prior to Visit  Medication  Sig Dispense Refill  . allopurinol (ZYLOPRIM) 300 MG tablet Take 0.5 tablets (150 mg total) by mouth daily. 90 tablet 1  . ALPRAZolam (XANAX) 1 MG tablet Take 1 tablet (1 mg total) by mouth 3 (three) times daily as needed for anxiety. 90 tablet 1  . baclofen (LIORESAL) 10 MG tablet Take 10 mg by mouth 3 (three) times daily. Takes as needed    . benazepril (LOTENSIN) 40 MG tablet Take 1 tablet (40 mg total) by mouth daily. (Patient taking differently: Take 20 mg by mouth daily. ) 90 tablet 3  . calcium carbonate (OSCAL) 1500 (600 Ca) MG TABS tablet Take 600 mg of elemental calcium by mouth daily with breakfast.    . cetirizine (ZYRTEC) 10 MG tablet Take 10 mg by mouth daily. Alternates between Allegra and Zyrtec as needed for allergies    . Cholecalciferol (VITAMIN D3) 5000 units CAPS Take 1 capsule by mouth.    . dexlansoprazole (DEXILANT) 60 MG capsule Take 60 mg by mouth 2 (two) times daily.    . fexofenadine (ALLEGRA) 180 MG tablet Take 180 mg by mouth daily as needed. Switches between Allegra and Zyrtec as needed for allergies    . fluticasone (FLONASE) 50 MCG/ACT nasal spray Place 2 sprays into both nostrils daily. 48 g 2  . folic acid (FOLVITE) 1 MG tablet Take 1 mg by mouth daily.    . furosemide (LASIX) 20 MG tablet Take 1 tablet (20 mg total) by mouth daily. As needed For edema 30 tablet 1  . HYDROcodone-acetaminophen (NORCO) 10-325 MG per tablet Take 1-2 tablets by mouth every 6 (six) hours as needed for moderate pain (pain.).     Marland Kitchen hydrOXYzine (ATARAX/VISTARIL) 10 MG tablet Take 1-3 tablets (10-30 mg total) by mouth 3 (three)  times daily as needed. 90 tablet 1  . methotrexate (RHEUMATREX) 2.5 MG tablet Take 2.5 mg by mouth 3 (three) times daily. Caution:Chemotherapy. Protect from light.    . metoprolol succinate (TOPROL-XL) 100 MG 24 hr tablet Take 1 tablet (100 mg total) by mouth daily. Take with or immediately following a meal. 90 tablet 3  . modafinil (PROVIGIL) 200 MG tablet Take 0.5 tablets (100 mg total) by mouth 2 (two) times daily. 90 tablet 1  . montelukast (SINGULAIR) 10 MG tablet TAKE 1 TABLET (10 MG TOTAL) BY MOUTH AT BEDTIME 90 tablet 3  . morphine (MS CONTIN) 30 MG 12 hr tablet Take 30 mg by mouth every 8 (eight) hours. Not from [place]    . Multiple Vitamins-Minerals (MULTIVITAMIN PO) Take 1 tablet by mouth daily.    . Olopatadine HCl (PAZEO) 0.7 % SOLN Apply 1 drop to eye daily. 2.5 mL 11  . ondansetron (ZOFRAN) 4 MG tablet Take 1 tablet (4 mg total) by mouth every 8 (eight) hours as needed for nausea or vomiting. 20 tablet 0  . Pancrelipase, Lip-Prot-Amyl, (CREON) 24000 UNITS CPEP Take 72,000 Units by mouth 3 (three) times daily as needed (for pancreatitis).     Marland Kitchen PROAIR HFA 108 (90 Base) MCG/ACT inhaler INHALE 2 PUFFS INTO THE LUNGS AS NEEDED FOR WHEEZING OR SHORTNESS OF BREATH. 8.5 each 0  . promethazine (PHENERGAN) 25 MG tablet Take 1 tablet (25 mg total) by mouth every 6 (six) hours as needed for nausea or vomiting. 30 tablet 1  . spironolactone (ALDACTONE) 100 MG tablet Take 1 tablet (100 mg total) by mouth daily. (Patient taking differently: Take 50 mg by mouth daily. ) 90 tablet 3  . sucralfate (CARAFATE)  1 GM/10ML suspension Take 10 mLs (1 g total) by mouth 4 (four) times daily as needed (stomach pain). 420 mL 1   No current facility-administered medications on file prior to visit.    Allergies  Allergen Reactions  . Dilaudid [Hydromorphone Hcl] Other (See Comments)    Headache   . Effexor [Venlafaxine]     pancreatitis  . Pregabalin Other (See Comments)    Drug induced stupor  .  Metoclopramide Hcl Other (See Comments)    "crawling inside"  . Neurontin [Gabapentin] Other (See Comments)    Tremors   . Other Other (See Comments)    Avoid all SSRI's and SNRI's- damaged pancreas  . Serotonin Reuptake Inhibitors (Ssris) Other (See Comments)    Damage to pancreas and bowel/urinary incontinence    Family History  Problem Relation Age of Onset  . Heart disease Mother   . COPD Mother   . Arthritis Mother   . Cancer Mother   . Hypertension Mother   . Arthritis Paternal Grandmother   . Heart disease Paternal Grandmother   . Heart disease Paternal Grandfather    Social History   Socioeconomic History  . Marital status: Single    Spouse name: None  . Number of children: 0  . Years of education: College  . Highest education level: None  Social Needs  . Financial resource strain: None  . Food insecurity - worry: None  . Food insecurity - inability: None  . Transportation needs - medical: None  . Transportation needs - non-medical: None  Occupational History  . Occupation: disabled  Tobacco Use  . Smoking status: Never Smoker  . Smokeless tobacco: Never Used  Substance and Sexual Activity  . Alcohol use: No  . Drug use: No  . Sexual activity: Not Currently  Other Topics Concern  . None  Social History Narrative   Caffeine 2 cups iced tea daily avg.   Depression screen [facility] 2/9 04/23/2017 02/19/2017 01/29/2017 11/25/2016 11/17/2016  Decreased Interest 0 0 0 1 2  Down, Depressed, Hopeless 0 0 0 1 2  PHQ - 2 Score 0 0 0 2 4  Altered sleeping - - - 0 2  Tired, decreased energy - - - 0 2  Change in appetite - - - 0 2  Feeling bad or failure about yourself  - - - 0 1  Trouble concentrating - - - 0 2  Moving slowly or fidgety/restless - - - 0 1  Suicidal thoughts - - - 0 0  PHQ-9 Score - - - 2 14  Difficult doing work/chores - - - Somewhat difficult Very difficult  Some recent data might be hidden    Review of Systems Night sweats???    Objective:    Physical Exam  Constitutional: She is oriented to person, place, and time. She appears well-developed and well-nourished. No distress.  HENT:  Head: Normocephalic and atraumatic.  Right Ear: Tympanic membrane, external ear and ear canal normal.  Left Ear: Tympanic membrane, external ear and ear canal normal.  Nose: Nose normal. No mucosal edema or rhinorrhea.  Mouth/Throat: Uvula is midline, oropharynx is clear and moist and mucous membranes are normal. No oropharyngeal exudate.  Eyes: Conjunctivae are normal. Right eye exhibits no discharge. Left eye exhibits no discharge. No scleral icterus.  Neck: Normal range of motion. Neck supple.  Cardiovascular: Normal rate, regular rhythm, normal heart sounds and intact distal pulses.  Pulmonary/Chest: Effort normal and breath sounds normal.  Lymphadenopathy:    She has  no cervical adenopathy.  Neurological: She is alert and oriented to person, place, and time.  Skin: Skin is warm and dry. She is not diaphoretic. No erythema.  Psychiatric: She has a normal mood and affect. Her behavior is normal.         BP 132/80   Pulse 88   Temp 98.4 F (36.9 C) (Oral)   Resp 17   Ht '5\' 5"'  (1.651 m)   Wt 294 lb (133.4 kg)   SpO2 98%   BMI 48.92 kg/m   Results for orders placed or performed in visit on 04/23/17  Culture, Group A Strep  Result Value Ref Range   Strep A Culture Comment   POCT rapid strep A  Result Value Ref Range   Rapid Strep A Screen Negative Negative    Assessment & Plan:  Xanax refills, Benazepril, hydroxyzine, lasix,  - needing a lot less of these since the botox injection - zofran?, creon?, promethazine? carafate? Pro-air Check uric acid level w/ next labs  1. Upper respiratory tract infection, unspecified type   2. Grief   3. Anxiety state   4. Acute pharyngitis, unspecified etiology     Orders Placed This Encounter  Procedures  . Culture, Group A Strep    Order Specific Question:   Source    Answer:    oropharynx  . POCT rapid strep A    Meds ordered this encounter  Medications  . hydrOXYzine (ATARAX/VISTARIL) 10 MG tablet    Sig: Take 1-3 tablets (10-30 mg total) 3 (three) times daily as needed by mouth.    Dispense:  90 tablet    Refill:  1  . ALPRAZolam (XANAX) 1 MG tablet    Sig: Take 1 tablet (1 mg total) 3 (three) times daily as needed by mouth for anxiety.    Dispense:  90 tablet    Refill:  1  . benazepril (LOTENSIN) 20 MG tablet    Sig: Take 1 tablet (20 mg total) daily by mouth.    Dispense:  90 tablet    Refill:  3    Please hold on file until pt requests. D/C prior rx for the 67m.  . furosemide (LASIX) 20 MG tablet    Sig: Take 1 tablet (20 mg total) every other day by mouth. But increase to daily As needed For edema    Dispense:  90 tablet    Refill:  1  . albuterol (PROAIR HFA) 108 (90 Base) MCG/ACT inhaler    Sig: Inhale 2 puffs every 4 (four) hours as needed into the lungs for wheezing or shortness of breath.    Dispense:  8.5 each    Refill:  11  . ipratropium (ATROVENT) 0.03 % nasal spray    Sig: Place 2 sprays 4 (four) times daily into the nose.    Dispense:  30 mL    Refill:  1    EDelman Cheadle M.D.  [facility] [phone]  04/25/17 10:59 PM

## 2017-04-23 ENCOUNTER — Encounter: Payer: Self-pay | Admitting: Family Medicine

## 2017-04-23 ENCOUNTER — Ambulatory Visit (INDEPENDENT_AMBULATORY_CARE_PROVIDER_SITE_OTHER): Payer: Medicare Other | Admitting: Family Medicine

## 2017-04-23 VITALS — BP 132/80 | HR 88 | Temp 98.4°F | Resp 17 | Ht 65.0 in | Wt 294.0 lb

## 2017-04-23 DIAGNOSIS — F4321 Adjustment disorder with depressed mood: Secondary | ICD-10-CM

## 2017-04-23 DIAGNOSIS — F411 Generalized anxiety disorder: Secondary | ICD-10-CM | POA: Diagnosis not present

## 2017-04-23 DIAGNOSIS — J069 Acute upper respiratory infection, unspecified: Secondary | ICD-10-CM

## 2017-04-23 DIAGNOSIS — J029 Acute pharyngitis, unspecified: Secondary | ICD-10-CM | POA: Diagnosis not present

## 2017-04-23 LAB — POCT RAPID STREP A (OFFICE): RAPID STREP A SCREEN: NEGATIVE

## 2017-04-23 MED ORDER — BENAZEPRIL HCL 20 MG PO TABS: 20.0000 mg | ORAL_TABLET | Freq: Every day | ORAL | 3 refills | Status: DC

## 2017-04-23 MED ORDER — HYDROXYZINE HCL 10 MG PO TABS: 10.0000 mg | ORAL_TABLET | Freq: Three times a day (TID) | ORAL | 1 refills | Status: DC | PRN

## 2017-04-23 MED ORDER — ALPRAZOLAM 1 MG PO TABS: 1.0000 mg | ORAL_TABLET | Freq: Three times a day (TID) | ORAL | 1 refills | Status: DC | PRN

## 2017-04-23 MED ORDER — IPRATROPIUM BROMIDE 0.03 % NA SOLN: 2.0000 | Freq: Four times a day (QID) | NASAL | 1 refills | Status: DC

## 2017-04-23 MED ORDER — ALBUTEROL SULFATE HFA 108 (90 BASE) MCG/ACT IN AERS: 2.0000 | INHALATION_SPRAY | RESPIRATORY_TRACT | 11 refills | Status: DC | PRN

## 2017-04-23 MED ORDER — FUROSEMIDE 20 MG PO TABS: 20.0000 mg | ORAL_TABLET | ORAL | 1 refills | Status: DC

## 2017-04-23 NOTE — Patient Instructions (Addendum)
Try increasing heating pads and hot showers in the a.m. for the stiffness. Try taking 2 tabs of the baclofen 10mg  before bed at night to see if it will help with a.m. Stiffness.  Increase lasix to 3d x 1 whenever you have acute fluid retention.  ook to call for GI med refills prn.  IF you received an x-ray today, you will receive an invoice from [facility]. Please contact [facility] at [phone] with questions or concerns regarding your invoice.   IF you received labwork today, you will receive an invoice from [facility]. Please contact [facility] at [phone] with questions or concerns regarding your invoice.   Our billing staff will not be able to assist you with questions regarding bills from these companies.  You will be contacted with the lab results as soon as they are available. The fastest way to get your results is to activate your My Chart account. Instructions are located on the last page of this paperwork. If you have not heard from [place] regarding the results in 2 weeks, please contact this office.

## 2017-04-26 LAB — CULTURE, GROUP A STREP: Strep A Culture: NEGATIVE

## 2017-04-27 DIAGNOSIS — D229 Melanocytic nevi, unspecified: Secondary | ICD-10-CM | POA: Diagnosis not present

## 2017-04-27 DIAGNOSIS — L249 Irritant contact dermatitis, unspecified cause: Secondary | ICD-10-CM | POA: Diagnosis not present

## 2017-04-27 DIAGNOSIS — Z85828 Personal history of other malignant neoplasm of skin: Secondary | ICD-10-CM | POA: Diagnosis not present

## 2017-04-30 ENCOUNTER — Telehealth: Payer: Self-pay | Admitting: Family Medicine

## 2017-04-30 DIAGNOSIS — R768 Other specified abnormal immunological findings in serum: Secondary | ICD-10-CM | POA: Diagnosis not present

## 2017-04-30 DIAGNOSIS — M792 Neuralgia and neuritis, unspecified: Secondary | ICD-10-CM | POA: Diagnosis not present

## 2017-04-30 DIAGNOSIS — M2550 Pain in unspecified joint: Secondary | ICD-10-CM | POA: Diagnosis not present

## 2017-04-30 DIAGNOSIS — R5383 Other fatigue: Secondary | ICD-10-CM | POA: Diagnosis not present

## 2017-04-30 DIAGNOSIS — Z79899 Other long term (current) drug therapy: Secondary | ICD-10-CM | POA: Diagnosis not present

## 2017-04-30 DIAGNOSIS — Z5181 Encounter for therapeutic drug level monitoring: Secondary | ICD-10-CM

## 2017-04-30 MED ORDER — FUROSEMIDE 40 MG PO TABS: 40.0000 mg | ORAL_TABLET | Freq: Every day | ORAL | 0 refills | Status: DC

## 2017-04-30 NOTE — Telephone Encounter (Signed)
The demadex is in the same class as lasix - a sister medication - so no reason to bother switching at this point as she is still on the lowest dose of lasix. Lets increase the lasix dose to 40mg  and have her take it daily (which is the normal dose).  Normally we have people on that dose take a K supplement daily - in her case we will hold off but will want to have her K rechecked with her next labs - she certainly doesn't need any more muscle cramps from that dropping.  New lasix sent to pharm but can double up on her current lasix if she prefers.

## 2017-04-30 NOTE — Telephone Encounter (Signed)
Called pt-was driving and will call back when safe to talk

## 2017-04-30 NOTE — Telephone Encounter (Signed)
Patient is using Lasix and is still retaining fluid. Patient tried taking it for 3 days in a row then back to every other day and she has gained 8 pounds between Friday and today. She wants to know what to do. Patient is having swelling in her hands, feet, and ankles.( it causing pain in the joints to get worse) She did note that in the past she had used Demadex to get the swelling down. (short term)

## 2017-04-30 NOTE — Telephone Encounter (Signed)
Copied from [place] [id number]. Topic: Quick Communication - See Telephone Encounter >> Apr 30, 2017  9:52 AM Ahmed Prima L wrote: CRM for notification. See Telephone encounter for:  Patient said that she is still retaining fluid and weight is going up and is still taking lasik and took it 3 days in a row and went back to every other day. She has gained 8 pounds in two days. She wants to know is there another pill she could switch too to help with this.   04/30/17.

## 2017-05-01 NOTE — Telephone Encounter (Signed)
Dr. Brigitte Pulse informed patient of new prescription.  Patient also wanted to know if you wanted her to bump her aldactone back up to 100 mg from 50 mg.   States if you want her to come in for a lab only she can do that.  Please advise

## 2017-05-01 NOTE — Telephone Encounter (Signed)
No because of her potassium issue. If her potassium start to drop, then that may be an option rather than adding in an additional K supplement. But on all last labs, her K is still in high normal range.  Would be good to have her come in for a bmp in 1 mo after going up on the lasix. Lab orders in.

## 2017-05-02 NOTE — Telephone Encounter (Signed)
Spoke with pt this Morning about her question and informed her of Dr. Brigitte Pulse advise, she verbalized understanding and informed me she would come in and get the blood drawn in one month.

## 2017-05-08 ENCOUNTER — Encounter: Payer: Self-pay | Admitting: Neurology

## 2017-05-22 DIAGNOSIS — M25541 Pain in joints of right hand: Secondary | ICD-10-CM | POA: Diagnosis not present

## 2017-05-22 DIAGNOSIS — M2550 Pain in unspecified joint: Secondary | ICD-10-CM | POA: Diagnosis not present

## 2017-05-22 DIAGNOSIS — M25542 Pain in joints of left hand: Secondary | ICD-10-CM | POA: Diagnosis not present

## 2017-05-22 DIAGNOSIS — Z1589 Genetic susceptibility to other disease: Secondary | ICD-10-CM | POA: Diagnosis not present

## 2017-05-22 DIAGNOSIS — R600 Localized edema: Secondary | ICD-10-CM | POA: Diagnosis not present

## 2017-05-22 DIAGNOSIS — M79669 Pain in unspecified lower leg: Secondary | ICD-10-CM | POA: Diagnosis not present

## 2017-05-22 DIAGNOSIS — M79671 Pain in right foot: Secondary | ICD-10-CM | POA: Diagnosis not present

## 2017-05-22 DIAGNOSIS — M79672 Pain in left foot: Secondary | ICD-10-CM | POA: Diagnosis not present

## 2017-05-24 ENCOUNTER — Telehealth: Payer: Self-pay

## 2017-05-24 DIAGNOSIS — D72819 Decreased white blood cell count, unspecified: Secondary | ICD-10-CM

## 2017-05-24 NOTE — Telephone Encounter (Signed)
Copied from [place]. Topic: General - Other >> May 24, 2017  9:56 AM Patrice Paradise wrote: Reason for CRM: Patient would like for Dr. Brigitte Pulse to put in for a CBC for her white blood count. Patient was at [facility] Tuesday and her white blood count was down a little

## 2017-05-25 ENCOUNTER — Encounter: Payer: Self-pay | Admitting: Family Medicine

## 2017-05-25 NOTE — Telephone Encounter (Addendum)
Done. And allerted pt in [place] email that There is also a bmp future order I placed last month so when she comes in for labs she should let the lab tech know exactly whether she would like that done as well or not since on different orders.

## 2017-05-25 NOTE — Addendum Note (Signed)
Addended by: Shawnee Knapp on: 05/25/2017 12:24 AM   Modules accepted: Orders

## 2017-05-29 ENCOUNTER — Ambulatory Visit: Payer: Medicare Other | Admitting: Emergency Medicine

## 2017-05-29 DIAGNOSIS — D72819 Decreased white blood cell count, unspecified: Secondary | ICD-10-CM | POA: Diagnosis not present

## 2017-05-29 DIAGNOSIS — Z5181 Encounter for therapeutic drug level monitoring: Secondary | ICD-10-CM

## 2017-05-30 ENCOUNTER — Encounter: Payer: Self-pay | Admitting: Family Medicine

## 2017-05-30 LAB — BASIC METABOLIC PANEL
BUN/Creatinine Ratio: 21 (ref 9–23)
BUN: 23 mg/dL (ref 6–24)
CALCIUM: 9.6 mg/dL (ref 8.7–10.2)
CO2: 24 mmol/L (ref 20–29)
CREATININE: 1.07 mg/dL — AB (ref 0.57–1.00)
Chloride: 102 mmol/L (ref 96–106)
GFR calc Af Amer: 68 mL/min/{1.73_m2} (ref >59)
GFR, EST NON AFRICAN AMERICAN: 59 mL/min/{1.73_m2} — AB (ref >59)
GLUCOSE: 118 mg/dL — AB (ref 65–99)
Potassium: 4.5 mmol/L (ref 3.5–5.2)
SODIUM: 140 mmol/L (ref 134–144)

## 2017-05-30 LAB — CBC WITH DIFFERENTIAL/PLATELET
BASOS ABS: 0 10*3/uL (ref 0.0–0.2)
Basos: 0 %
EOS (ABSOLUTE): 0.2 10*3/uL (ref 0.0–0.4)
EOS: 2 %
HEMATOCRIT: 38.9 % (ref 34.0–46.6)
HEMOGLOBIN: 12.7 g/dL (ref 11.1–15.9)
IMMATURE GRANS (ABS): 0 10*3/uL (ref 0.0–0.1)
IMMATURE GRANULOCYTES: 0 %
LYMPHS ABS: 2.7 10*3/uL (ref 0.7–3.1)
LYMPHS: 26 %
MCH: 29.9 pg (ref 26.6–33.0)
MCHC: 32.6 g/dL (ref 31.5–35.7)
MCV: 92 fL (ref 79–97)
MONOCYTES: 5 %
Monocytes Absolute: 0.5 10*3/uL (ref 0.1–0.9)
NEUTROS PCT: 67 %
Neutrophils Absolute: 7 10*3/uL (ref 1.4–7.0)
Platelets: 314 10*3/uL (ref 150–379)
RBC: 4.25 x10E6/uL (ref 3.77–5.28)
RDW: 15.4 % (ref 12.3–15.4)
WBC: 10.4 10*3/uL (ref 3.4–10.8)

## 2017-05-31 ENCOUNTER — Telehealth: Payer: Self-pay | Admitting: *Deleted

## 2017-05-31 NOTE — Telephone Encounter (Signed)
Copied from [place] [id number]. Topic: Inquiry >> May 31, 2017  9:42 AM Aurelio Brash B wrote: Reason for CRM:  Pt is confused over  her mychart lab results from 12/18  the reason for the visit states dr visit  and she is concerned she will be billed for a dr visit she did not have  she only came in for the lab visit. PT wants to know if she will be charged for a Dr visit

## 2017-06-11 ENCOUNTER — Ambulatory Visit (INDEPENDENT_AMBULATORY_CARE_PROVIDER_SITE_OTHER): Payer: Medicare Other

## 2017-06-11 ENCOUNTER — Ambulatory Visit (INDEPENDENT_AMBULATORY_CARE_PROVIDER_SITE_OTHER): Payer: Medicare Other | Admitting: Family Medicine

## 2017-06-11 ENCOUNTER — Encounter: Payer: Self-pay | Admitting: Family Medicine

## 2017-06-11 VITALS — BP 100/70 | HR 87 | Temp 98.5°F | Resp 18 | Ht 65.0 in | Wt 291.0 lb

## 2017-06-11 DIAGNOSIS — E8881 Metabolic syndrome: Secondary | ICD-10-CM

## 2017-06-11 DIAGNOSIS — D72829 Elevated white blood cell count, unspecified: Secondary | ICD-10-CM

## 2017-06-11 DIAGNOSIS — R1011 Right upper quadrant pain: Secondary | ICD-10-CM

## 2017-06-11 DIAGNOSIS — R7302 Impaired glucose tolerance (oral): Secondary | ICD-10-CM | POA: Diagnosis not present

## 2017-06-11 LAB — POC MICROSCOPIC URINALYSIS (UMFC): MUCUS RE: ABSENT

## 2017-06-11 LAB — POCT URINALYSIS DIP (MANUAL ENTRY)
BILIRUBIN UA: NEGATIVE
BILIRUBIN UA: NEGATIVE mg/dL
Blood, UA: NEGATIVE
GLUCOSE UA: NEGATIVE mg/dL
LEUKOCYTES UA: NEGATIVE
NITRITE UA: NEGATIVE
Protein Ur, POC: NEGATIVE mg/dL
Spec Grav, UA: 1.015 (ref 1.010–1.025)
Urobilinogen, UA: 0.2 E.U./dL
pH, UA: 6 (ref 5.0–8.0)

## 2017-06-11 LAB — POCT CBC
Granulocyte percent: 52.6 %G (ref 37–80)
HEMATOCRIT: 40.7 % (ref 37.7–47.9)
HEMOGLOBIN: 13.2 g/dL (ref 12.2–16.2)
LYMPH, POC: 3.2 (ref 0.6–3.4)
MCH, POC: 29.8 pg (ref 27–31.2)
MCHC: 32.5 g/dL (ref 31.8–35.4)
MCV: 91.5 fL (ref 80–97)
MID (CBC): 0.5 (ref 0–0.9)
MPV: 8.2 fL (ref 0–99.8)
POC GRANULOCYTE: 4.2 (ref 2–6.9)
POC LYMPH %: 41 % (ref 10–50)
POC MID %: 6.4 % (ref 0–12)
Platelet Count, POC: 320 10*3/uL (ref 142–424)
RBC: 4.44 M/uL (ref 4.04–5.48)
RDW, POC: 14.7 %
WBC: 7.9 10*3/uL (ref 4.6–10.2)

## 2017-06-11 NOTE — Patient Instructions (Addendum)
IF you received an x-ray today, you will receive an invoice from [facility]. Please contact [facility] at [phone] with questions or concerns regarding your invoice.   IF you received labwork today, you will receive an invoice from [place]. Please contact [facility] at [phone] with questions or concerns regarding your invoice.   Our billing staff will not be able to assist you with questions regarding bills from these companies.  You will be contacted with the lab results as soon as they are available. The fastest way to get your results is to activate your My Chart account. Instructions are located on the last page of this paperwork. If you have not heard from [place] regarding the results in 2 weeks, please contact this office.     Metabolic Syndrome Metabolic syndrome is the presence of at least three factors that increase your risk of getting cardiovascular disease and diabetes. These factors are:  High blood sugar.  High blood triglyceride level.  High blood pressure.  Low levels of good blood cholesterol (high-density lipoprotein or HDL).  Excess weight around the waist. This factor is present with a waist measurement of: ? More than 40 inches in men. ? More than 35 inches in women.  Metabolic syndrome is sometimes called insulin resistance syndrome and syndrome X. What are the causes? The exact cause is not known, but genetics and lifestyle choices play a role. What increases the risk? You are more likely to develop metabolic syndrome if:  You eat a diet high in calories and saturated fat.  You do not exercise regularly.  You are overweight.  You have a family history of metabolic syndrome.  You are Asian.  You are older in age.  You have insulin resistance.  You use any tobacco products, including cigarettes, chewing tobacco, or electronic cigarettes.  What are the signs or symptoms? Metabolic syndrome has no specific  symptoms. How is this diagnosed? To make a diagnosis, your health care provider will determine whether you have at least three of the factors that make up metabolic syndrome by:  Taking your blood pressure.  Measuring your waist.  Ordering blood tests.  How is this treated? Treatment may include:  Lifestyle changes to reduce your risk for heart disease and stroke, such as: ? Exercise. ? Weight loss. ? Maintaining a healthy diet. ? Quitting the use of any tobacco products, including cigarettes, chewing tobacco, or electronic cigarettes.  Medicines that: ? Help your body to maintain glucose control. ? Reduce your blood pressure and your blood triglyceride levels.  Follow these instructions at home:  Exercise regularly.  Maintain a healthy diet.  Do not use any tobacco products, including cigarettes, chewing tobacco, or electronic cigarettes. If you need help quitting, ask your health care provider.  Keep all follow-up visits as directed by your health care provider. This is important.  Measure your waist regularly and record the measurement. To measure your waist: ? Stand up straight. ? Breathe out. ? Wrap the measuring tape around the part of your waist that is just above your hipbones. ? Read the measurement. Contact a health care provider if:  You feel very tired.  You develop excessive thirst.  You pass large quantities of urine.  You put on weight around your waist.  You have headaches over and over again.  You have a dizzy spell. Get help right away if:  You develop sudden blurred vision.  You develop a sudden dizzy spell.  You have sudden trouble  speaking or swallowing.  You have sudden weakness in your arm or leg.  You have chest pains or trouble breathing.  You feel like your heartbeat is abnormal.  You faint. This information is not intended to replace advice given to you by your health care provider. Make sure you discuss any questions you  have with your health care provider. Document Released: 09/05/2007 Document Revised: 11/04/2015 Document Reviewed: 01/02/2014 Elsevier Interactive Patient Education  2018 [employer].   Diet for Metabolic Syndrome Metabolic syndrome is a disorder that includes at least three of these conditions:  Abdominal obesity.  Too much sugar in your blood.  High blood pressure.  Higher than normal amount of fat (lipids) in your blood.  Lower than normal level of "good" cholesterol (HDL).  Following a healthy diet can help to keep metabolic syndrome under control. It can also help to prevent the development of conditions that are associated with metabolic syndrome, such as diabetes, heart disease, and stroke. Along with exercise, a healthy diet:  Helps to improve the way that the body uses insulin.  Promotes weight loss. A common goal for people with this condition is to lose at least 7 to 10 percent of their starting weight.  What do I need to know about this diet?  Use the glycemic index (GI) to plan your meals. The index tells you how quickly a food will raise your blood sugar. Choose foods that have low GI values. These foods take a longer time to raise blood sugar.  Keep track of how many calories you take in. Eating the right amount of calories will help your achieve a healthy weight.  You may want to follow a Mediterranean diet. This diet includes lots of vegetables, lean meats or fish, whole grains, fruits, and healthy oils and fats. What foods can I eat? Grains Stone-ground whole wheat. Pumpernickel bread. Whole-grain bread, crackers, tortillas, cereal, and pasta. Unsweetened oatmeal.Bulgur.Barley.Quinoa.Brown rice or wild rice. Vegetables Lettuce. Spinach. Peas. Beets. Cauliflower. Cabbage. Broccoli. Carrots. Tomatoes. Squash. Eggplant. Herbs. Peppers. Onions. Cucumbers. Brussels sprouts. Sweet potatoes. Yams. Beans. Lentils. Fruits Berries. Apples. Oranges. Grapes. Mango.  Pomegranate. Kiwi. Cherries. Meats and Other Protein Sources Seafood and shellfish. Lean meats.Poultry. Tofu. Dairy Low-fat or fat-free dairy products, such as milk, yogurt, and cheese. Beverages Water. Low-fat milk. Milk alternatives, like soy milk or almond milk. Real fruit juice. Condiments Low-sugar or sugar-free ketchup, barbecue sauce, and mayonnaise. Mustard. Relish. Fats and Oils Avocado. Canola or olive oil. Nuts and nut butters.Seeds. The items listed above may not be a complete list of recommended foods or beverages. Contact your dietitian for more options. What foods are not recommended? Red meat. Palm oil and coconut oil. Processed foods. Fried foods. Alcohol. Sweetened drinks, such as iced tea and soda. Sweets. Salty foods. The items listed above may not be a complete list of foods and beverages to avoid. Contact your dietitian for more information. This information is not intended to replace advice given to you by your health care provider. Make sure you discuss any questions you have with your health care provider. Document Released: 10/13/2014 Document Revised: 10/08/2015 Document Reviewed: 06/10/2014 Elsevier Interactive Patient Education  2018 [employer].

## 2017-06-11 NOTE — Progress Notes (Signed)
Subjective:    Patient ID: Valerie [name], female    DOB: [birth year], 56 y.o.   MRN: [id number] Chief Complaint  Patient presents with  . WBC count    states wbc is above normal range    HPI Periodically has pain right at the bottom of her rib cage - this is is lower but still upper abdomen and radiates down to the right inguinal area.  Present intermittently for the past month.  A burning constant pain for minutes to hours that gradually is relieved. Varies between every other day to several times daily.  Recently got a little dehydrated so stools became less frequent so added in miralax every few days and increasing hydration while still watching fluid intake.  No urine symptoms.  Appetite has been ok -- has been trying to stick to healthy diet (other than chip binge yesterday followed by some nausea last night and this morning). The pain last night was there when she woke up and was radiating around her RUQ to her back.  Happened again today after lunch - does seems to happen after eating. No GERD/heartburn. Since the botox injection, things are emptying.  A little gas but not much bloating.   Pt is s/p cholecystectomy.  Sphincter of Odi has been "fileted" so it doesn't really work anymore.  No melena, no diarrhea.    She has a weird aftertaste in her mouth.   [facility] Dr. Nancy Fetter has her scheduled for an echo, a bone scan, and an MRI but she is not sure of what.  Past Medical History:  Diagnosis Date  . Acute ischemic colitis ([place])   . Anemia 05/01/2012  . Anxiety   . ARF (acute renal failure) ([place]) 12/28/2015  . Arthritis    "basically ~ q joint" (12/28/2015)  . Asthma   . Basal cell carcinoma of upper back ~ 2011  . Chronic right-sided headaches    and neck pain, relieved wiht PT and chronic practic manipulation  . DDD (degenerative disc disease), lumbar   . Diarrhea    EPISODIC  . Fatigue    h/o excessive sedation, etiology unknown  . Fibromyalgia   . First degree heart  block   . GERD (gastroesophageal reflux disease) 04/06/2017  . Headache(784.0)    "muscular w/migraine intensity; ~ 5-6 months" (12/28/2015)  . History of ischemic colitis 09/24/2016  . Hypertension   . Mental disorder    reports PTSD  . Microscopic polyangiitis ([place])   . Nerve pain 2011   nerve damage to right arm; normal EMG/NCV to RUExt after shoulder surgery 12/2009  . Neuromuscular disorder ([place])   . Pancreatitis    due to Effexor  . Pneumonia ~ 2014; 08/2015   Aspiration  . Polymyalgia rheumatica ([place]) onset 08/2015   changed diagnosis to MPA  . Precancerous melanosis ([place])    "2nd toe left foot; 5th toe of right foot; right temple; had them all cut off"  . Sleep apnea    CPAP-9   Past Surgical History:  Procedure Laterality Date  . BASAL CELL CARCINOMA EXCISION  ~ 2011   back  . BOTOX INJECTION N/A 11/06/2012   Procedure: BOTOX INJECTION;  Surgeon: Missy Sabins, MD;  Location: [facility];  Service: Endoscopy;  Laterality: N/A;  . BOTOX INJECTION N/A 12/04/2013   Procedure: BOTOX INJECTION;  Surgeon: Missy Sabins, MD;  Location: [place];  Service: Endoscopy;  Laterality: N/A;  . BOTOX INJECTION N/A 02/26/2014   Procedure: BOTOX  INJECTION;  Surgeon: Missy Sabins, MD;  [facility] END[facility]COPY;  Service: Endoscopy;  Laterality: N/A;  . BOTOX INJECTION N/A 07/08/2015   Procedure: BOTOX INJECTION;  Surgeon: Teena Irani, MD;  Location: [facility];  Service: Endoscopy;  Laterality: N/A;  . BOTOX INJECTION N/A 04/27/2016   Procedure: BOTOX INJECTION;  Surgeon: Teena Irani, MD;  Location: [facility];  Service: Endoscopy;  Laterality: N/A;  . BOTOX INJECTION N/A 04/09/2017   Procedure: BOTOX INJECTION;  Surgeon: Clarene Essex, MD;  Location: [place];  Service: Endoscopy;  Laterality: N/A;  . ESOPHAGOGASTRODUODENOSCOPY  12/21/2011   Procedure: ESOPHAGOGASTRODUODENOSCOPY (EGD);  Surgeon: Missy Sabins, MD;  [facility];  Service: Endoscopy;  Laterality: N/A;  .  ESOPHAGOGASTRODUODENOSCOPY N/A 11/06/2012   Procedure: ESOPHAGOGASTRODUODENOSCOPY (EGD);  Surgeon: Missy Sabins, MD;  Location: [facility];  Service: Endoscopy;  Laterality: N/A;  . ESOPHAGOGASTRODUODENOSCOPY N/A 12/04/2013   Procedure: ESOPHAGOGASTRODUODENOSCOPY (EGD);  Surgeon: Missy Sabins, MD;  Location: [facility] ENDOSCOPY;  Service: Endoscopy;  Laterality: N/A;  . ESOPHAGOGASTRODUODENOSCOPY N/A 02/26/2014   Procedure: ESOPHAGOGASTRODUODENOSCOPY (EGD);  Surgeon: Missy Sabins, MD;  Location: [facility] ENDOSCOPY;  Service: Endoscopy;  Laterality: N/A;  . ESOPHAGOGASTRODUODENOSCOPY N/A 04/09/2017   Procedure: ESOPHAGOGASTRODUODENOSCOPY (EGD);  Surgeon: Clarene Essex, MD;  Location: [place];  Service: Endoscopy;  Laterality: N/A;  . ESOPHAGOGASTRODUODENOSCOPY (EGD) WITH PROPOFOL N/A 07/08/2015   Procedure: ESOPHAGOGASTRODUODENOSCOPY (EGD) WITH PROPOFOL;  Surgeon: Teena Irani, MD;  Location: [facility];  Service: Endoscopy;  Laterality: N/A;  . ESOPHAGOGASTRODUODENOSCOPY (EGD) WITH PROPOFOL N/A 04/27/2016   Procedure: ESOPHAGOGASTRODUODENOSCOPY (EGD) WITH PROPOFOL;  Surgeon: Teena Irani, MD;  Location: [facility];  Service: Endoscopy;  Laterality: N/A;  . FLEXIBLE SIGMOIDOSCOPY  05/01/2012   Procedure: FLEXIBLE SIGMOIDOSCOPY;  Surgeon: Jeryl Columbia, MD;  Location: [facility];  Service: Endoscopy;  Laterality: N/A;  unprepped  . FLEXIBLE SIGMOIDOSCOPY Left 09/28/2016   Procedure: FLEXIBLE SIGMOIDOSCOPY;  Surgeon: Wilford Corner, MD;  Location: [facility];  Service: Endoscopy;  Laterality: Left;  . LAPAROSCOPIC CHOLECYSTECTOMY  01/2002  . NASAL SEPTOPLASTY W/ TURBINOPLASTY  1998  . SHOULDER ARTHROSCOPY WITH ROTATOR CUFF REPAIR AND OPEN BICEPS TENODESIS Bilateral 2010-2013   left-right   Current Outpatient Medications on File Prior to Visit  Medication Sig Dispense Refill  . albuterol (PROAIR HFA) 108 (90 Base) MCG/ACT inhaler Inhale 2 puffs every 4 (four) hours as needed into the lungs for wheezing or  shortness of breath. 8.5 each 11  . allopurinol (ZYLOPRIM) 300 MG tablet Take 0.5 tablets (150 mg total) by mouth daily. 90 tablet 1  . ALPRAZolam (XANAX) 1 MG tablet Take 1 tablet (1 mg total) 3 (three) times daily as needed by mouth for anxiety. 90 tablet 1  . baclofen (LIORESAL) 10 MG tablet Take 10 mg by mouth 3 (three) times daily. Takes as needed    . benazepril (LOTENSIN) 20 MG tablet Take 1 tablet (20 mg total) daily by mouth. 90 tablet 3  . calcium carbonate (OSCAL) 1500 (600 Ca) MG TABS tablet Take 600 mg of elemental calcium by mouth daily with breakfast.    . cetirizine (ZYRTEC) 10 MG tablet Take 10 mg by mouth daily. Alternates between Allegra and Zyrtec as needed for allergies    . Cholecalciferol (VITAMIN D3) 5000 units CAPS Take 1 capsule by mouth.    . dexlansoprazole (DEXILANT) 60 MG capsule Take 60 mg by mouth 2 (two) times daily.    . fexofenadine (ALLEGRA) 180 MG tablet Take 180 mg by mouth daily as needed. Switches  between [place] and Zyrtec as needed for allergies    . fluticasone (FLONASE) 50 MCG/ACT nasal spray Place 2 sprays into both nostrils daily. 48 g 2  . folic acid (FOLVITE) 1 MG tablet Take 400 mcg by mouth daily.     . furosemide (LASIX) 40 MG tablet Take 1 tablet (40 mg total) daily by mouth. As needed For edema 90 tablet 0  . HYDROcodone-acetaminophen (NORCO) 10-325 MG per tablet Take 1-2 tablets by mouth every 6 (six) hours as needed for moderate pain (pain.).     [place] Kitchen hydrOXYzine (ATARAX/VISTARIL) 10 MG tablet Take 1-3 tablets (10-30 mg total) 3 (three) times daily as needed by mouth. 90 tablet 1  . methotrexate (RHEUMATREX) 2.5 MG tablet Take 2.5 mg by mouth 3 (three) times daily. Caution:Chemotherapy. Protect from light.    . metoprolol succinate (TOPROL-XL) 100 MG 24 hr tablet Take 1 tablet (100 mg total) by mouth daily. Take with or immediately following a meal. 90 tablet 3  . modafinil (PROVIGIL) 200 MG tablet Take 0.5 tablets (100 mg total) by mouth 2 (two)  times daily. 90 tablet 1  . montelukast (SINGULAIR) 10 MG tablet TAKE 1 TABLET (10 MG TOTAL) BY MOUTH AT BEDTIME 90 tablet 3  . morphine (MS CONTIN) 30 MG 12 hr tablet Take 30 mg by mouth every 8 (eight) hours. Not from [place]    . Multiple Vitamins-Minerals (MULTIVITAMIN PO) Take 1 tablet by mouth daily.    . Olopatadine HCl (PAZEO) 0.7 % SOLN Apply 1 drop to eye daily. 2.5 mL 11  . ondansetron (ZOFRAN) 4 MG tablet Take 1 tablet (4 mg total) by mouth every 8 (eight) hours as needed for nausea or vomiting. 20 tablet 0  . Pancrelipase, Lip-Prot-Amyl, (CREON) 24000 UNITS CPEP Take 72,000 Units by mouth 3 (three) times daily as needed (for pancreatitis).     . promethazine (PHENERGAN) 25 MG tablet Take 1 tablet (25 mg total) by mouth every 6 (six) hours as needed for nausea or vomiting. 30 tablet 1  . spironolactone (ALDACTONE) 100 MG tablet Take 1 tablet (100 mg total) by mouth daily. (Patient taking differently: Take 50 mg by mouth daily. ) 90 tablet 3  . sucralfate (CARAFATE) 1 GM/10ML suspension Take 10 mLs (1 g total) by mouth 4 (four) times daily as needed (stomach pain). 420 mL 1  . ipratropium (ATROVENT) 0.03 % nasal spray Place 2 sprays 4 (four) times daily into the nose. (Patient not taking: Reported on 06/11/2017) 30 mL 1   No current facility-administered medications on file prior to visit.    Allergies  Allergen Reactions  . Dilaudid [Hydromorphone Hcl] Other (See Comments)    Headache   . Effexor [Venlafaxine]     pancreatitis  . Pregabalin Other (See Comments)    Drug induced stupor  . Metoclopramide Hcl Other (See Comments)    "crawling inside"  . Neurontin [Gabapentin] Other (See Comments)    Tremors   . Other Other (See Comments)    Avoid all SSRI's and SNRI's- damaged pancreas  . Serotonin Reuptake Inhibitors (Ssris) Other (See Comments)    Damage to pancreas and bowel/urinary incontinence    Family History  Problem Relation Age of Onset  . Heart disease Mother   . COPD  Mother   . Arthritis Mother   . Cancer Mother   . Hypertension Mother   . Arthritis Paternal Grandmother   . Heart disease Paternal Grandmother   . Heart disease Paternal Grandfather    Social  History   Socioeconomic History  . Marital status: Single    Spouse name: Not on file  . Number of children: 0  . Years of education: College  . Highest education level: Not on file  Social Needs  . Financial resource strain: Not on file  . Food insecurity - worry: Not on file  . Food insecurity - inability: Not on file  . Transportation needs - medical: Not on file  . Transportation needs - non-medical: Not on file  Occupational History  . Occupation: disabled  Tobacco Use  . Smoking status: Never Smoker  . Smokeless tobacco: Never Used  Substance and Sexual Activity  . Alcohol use: No  . Drug use: No  . Sexual activity: Not Currently  Other Topics Concern  . Not on file  Social History Narrative   Caffeine 2 cups iced tea daily avg.   Depression screen [facility] 2/9 04/23/2017 02/19/2017 01/29/2017 11/25/2016 11/17/2016  Decreased Interest 0 0 0 1 2  Down, Depressed, Hopeless 0 0 0 1 2  PHQ - 2 Score 0 0 0 2 4  Altered sleeping - - - 0 2  Tired, decreased energy - - - 0 2  Change in appetite - - - 0 2  Feeling bad or failure about yourself  - - - 0 1  Trouble concentrating - - - 0 2  Moving slowly or fidgety/restless - - - 0 1  Suicidal thoughts - - - 0 0  PHQ-9 Score - - - 2 14  Difficult doing work/chores - - - Somewhat difficult Very difficult  Some recent data might be hidden     Review of Systems See hpi    Objective:   Physical Exam  Constitutional: She is oriented to person, place, and time. She appears well-developed and well-nourished. No distress.  HENT:  Head: Normocephalic and atraumatic.  Neck: Normal range of motion. Neck supple. No thyromegaly present.  Cardiovascular: Normal rate, regular rhythm, normal heart sounds and intact distal pulses.    Pulmonary/Chest: Effort normal and breath sounds normal. No respiratory distress.  Abdominal: Soft. Normal appearance. She exhibits no distension and no mass. Bowel sounds are increased. There is no hepatosplenomegaly. There is tenderness (mild) in the right upper quadrant. There is no rebound, no guarding, no CVA tenderness, no tenderness at McBurney's point and negative Murphy's sign.  Musculoskeletal: She exhibits no edema.  Lymphadenopathy:    She has no cervical adenopathy.  Neurological: She is alert and oriented to person, place, and time.  Skin: Skin is warm and dry. She is not diaphoretic. No erythema.  Psychiatric: She has a normal mood and affect. Her behavior is normal.      BP 100/70   Pulse 87   Temp 98.5 F (36.9 C) (Oral)   Resp 18   Ht 5\' 5"  (1.651 m)   Wt 291 lb (132 kg)   SpO2 94%   BMI 48.42 kg/m   Results for orders placed or performed in visit on 06/11/17  POCT urinalysis dipstick  Result Value Ref Range   Color, UA yellow yellow   Clarity, UA clear clear   Glucose, UA negative negative mg/dL   Bilirubin, UA negative negative   Ketones, POC UA negative negative mg/dL   Spec Grav, UA 1.015 1.010 - 1.025   Blood, UA negative negative   pH, UA 6.0 5.0 - 8.0   Protein Ur, POC negative negative mg/dL   Urobilinogen, UA 0.2 0.2 or 1.0 E.U./dL  Nitrite, UA Negative Negative   Leukocytes, UA Negative Negative  POCT Microscopic Urinalysis ([facility])  Result Value Ref Range   WBC,UR,HPF,POC None None WBC/hpf   RBC,UR,HPF,POC None None RBC/hpf   Bacteria None None, Too numerous to count   Mucus Absent Absent   Epithelial Cells, UR Per Microscopy None None, Too numerous to count cells/hpf  POCT CBC  Result Value Ref Range   WBC 7.9 4.6 - 10.2 K/uL   Lymph, poc 3.2 0.6 - 3.4   POC LYMPH PERCENT 41.0 10 - 50 %L   MID (cbc) 0.5 0 - 0.9   POC MID % 6.4 0 - 12 %M   POC Granulocyte 4.2 2 - 6.9   Granulocyte percent 52.6 37 - 80 %G   RBC 4.44 4.04 - 5.48 M/uL    Hemoglobin 13.2 12.2 - 16.2 g/dL   HCT, POC 40.7 37.7 - 47.9 %   MCV 91.5 80 - 97 fL   MCH, POC 29.8 27 - 31.2 pg   MCHC 32.5 31.8 - 35.4 g/dL   RDW, POC 14.7 %   Platelet Count, POC 320 142 - 424 K/uL   MPV 8.2 0 - 99.8 fL       Assessment & Plan:   1. Leukocytosis, unspecified type - resolved, not clincally significant  2. Abdominal pain, right upper quadrant - unknown etiology - w/u thus far completely nml - poss scar tissue from prior cholecystectomy or colitis?  If sxs cont, can check stool studies for wbc & rbc to r/o recurrence of colitis and get [place].  3. Impaired glucose tolerance - developed borderline DM 6 mos ago - a1c stable Lab Results  Component Value Date   HGBA1C 6.5 (H) 06/11/2017   HGBA1C 6.5 (H) 12/08/2016   HGBA1C 6.2 (H) 07/31/2016    4. Metabolic syndrome     Orders Placed This Encounter  Procedures  . Urine Culture  . DG Abd 2 Views    Standing Status:   Future    Number of Occurrences:   1    Standing Expiration Date:   06/11/2018    Order Specific Question:   Reason for Exam (SYMPTOM  OR DIAGNOSIS REQUIRED)    Answer:   Colicky right upper quandrant abd pain x 1 mo, hyperactive BS    Order Specific Question:   Is the patient pregnant?    Answer:   No    Order Specific Question:   Preferred imaging location?    Answer:   External  . Comprehensive metabolic panel  . Lipase  . Hemoglobin A1c  . Fecal lactoferrin, quant  . POCT urinalysis dipstick  . POCT Microscopic Urinalysis ([facility])  . POCT CBC  . POC Hemoccult Bld/Stl (3-Cd Home Screen)    Standing Status:   Future    Standing Expiration Date:   06/11/2018     Delman Cheadle, M.D.  Primary Care at [facility] [address] [place], [place] [address] [phone] phone [phone] fax  06/13/17 8:45 PM

## 2017-06-12 LAB — COMPREHENSIVE METABOLIC PANEL
ALBUMIN: 4.6 g/dL (ref 3.5–5.5)
ALK PHOS: 60 IU/L (ref 39–117)
ALT: 15 IU/L (ref 0–32)
AST: 21 IU/L (ref 0–40)
Albumin/Globulin Ratio: 1.6 (ref 1.2–2.2)
BUN / CREAT RATIO: 24 — AB (ref 9–23)
BUN: 28 mg/dL — AB (ref 6–24)
Bilirubin Total: 0.3 mg/dL (ref 0.0–1.2)
CO2: 23 mmol/L (ref 20–29)
CREATININE: 1.16 mg/dL — AB (ref 0.57–1.00)
Calcium: 9.7 mg/dL (ref 8.7–10.2)
Chloride: 98 mmol/L (ref 96–106)
GFR calc non Af Amer: 53 mL/min/{1.73_m2} — ABNORMAL LOW (ref >59)
GFR, EST AFRICAN AMERICAN: 61 mL/min/{1.73_m2} (ref >59)
GLUCOSE: 96 mg/dL (ref 65–99)
Globulin, Total: 2.8 g/dL (ref 1.5–4.5)
Potassium: 4.7 mmol/L (ref 3.5–5.2)
SODIUM: 139 mmol/L (ref 134–144)
TOTAL PROTEIN: 7.4 g/dL (ref 6.0–8.5)

## 2017-06-12 LAB — LIPASE: Lipase: 51 U/L (ref 14–72)

## 2017-06-12 LAB — URINE CULTURE: ORGANISM ID, BACTERIA: NO GROWTH

## 2017-06-12 LAB — HEMOGLOBIN A1C
Est. average glucose Bld gHb Est-mCnc: 140 mg/dL
Hgb A1c MFr Bld: 6.5 % — ABNORMAL HIGH (ref 4.8–5.6)

## 2017-06-16 IMAGING — DX DG KNEE COMPLETE 4+V*L*
2 series · 2 of 2 positions shown · non-contrast
Comparison: None available

CLINICAL DATA: Fall, twisting injury, left knee pain

EXAM:
LEFT KNEE - COMPLETE 4+ VIEW

[knee ap]
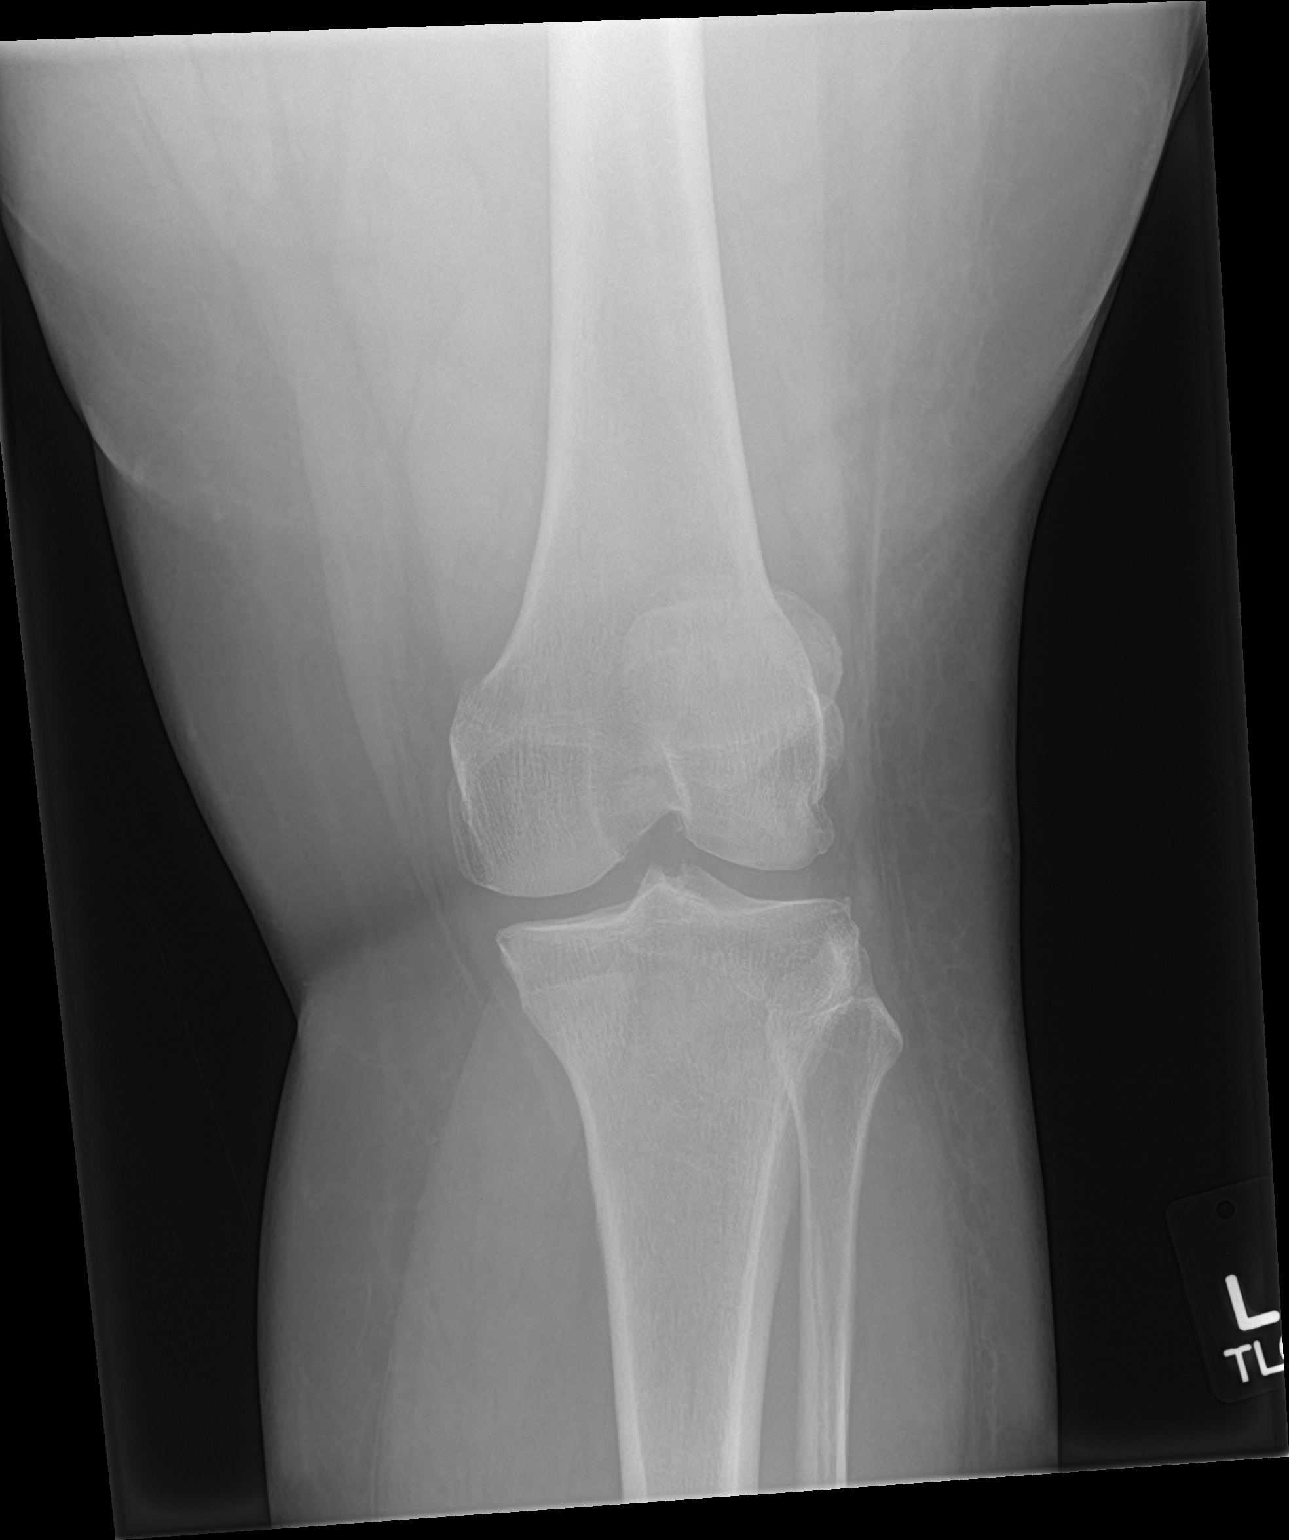

[knee obl]
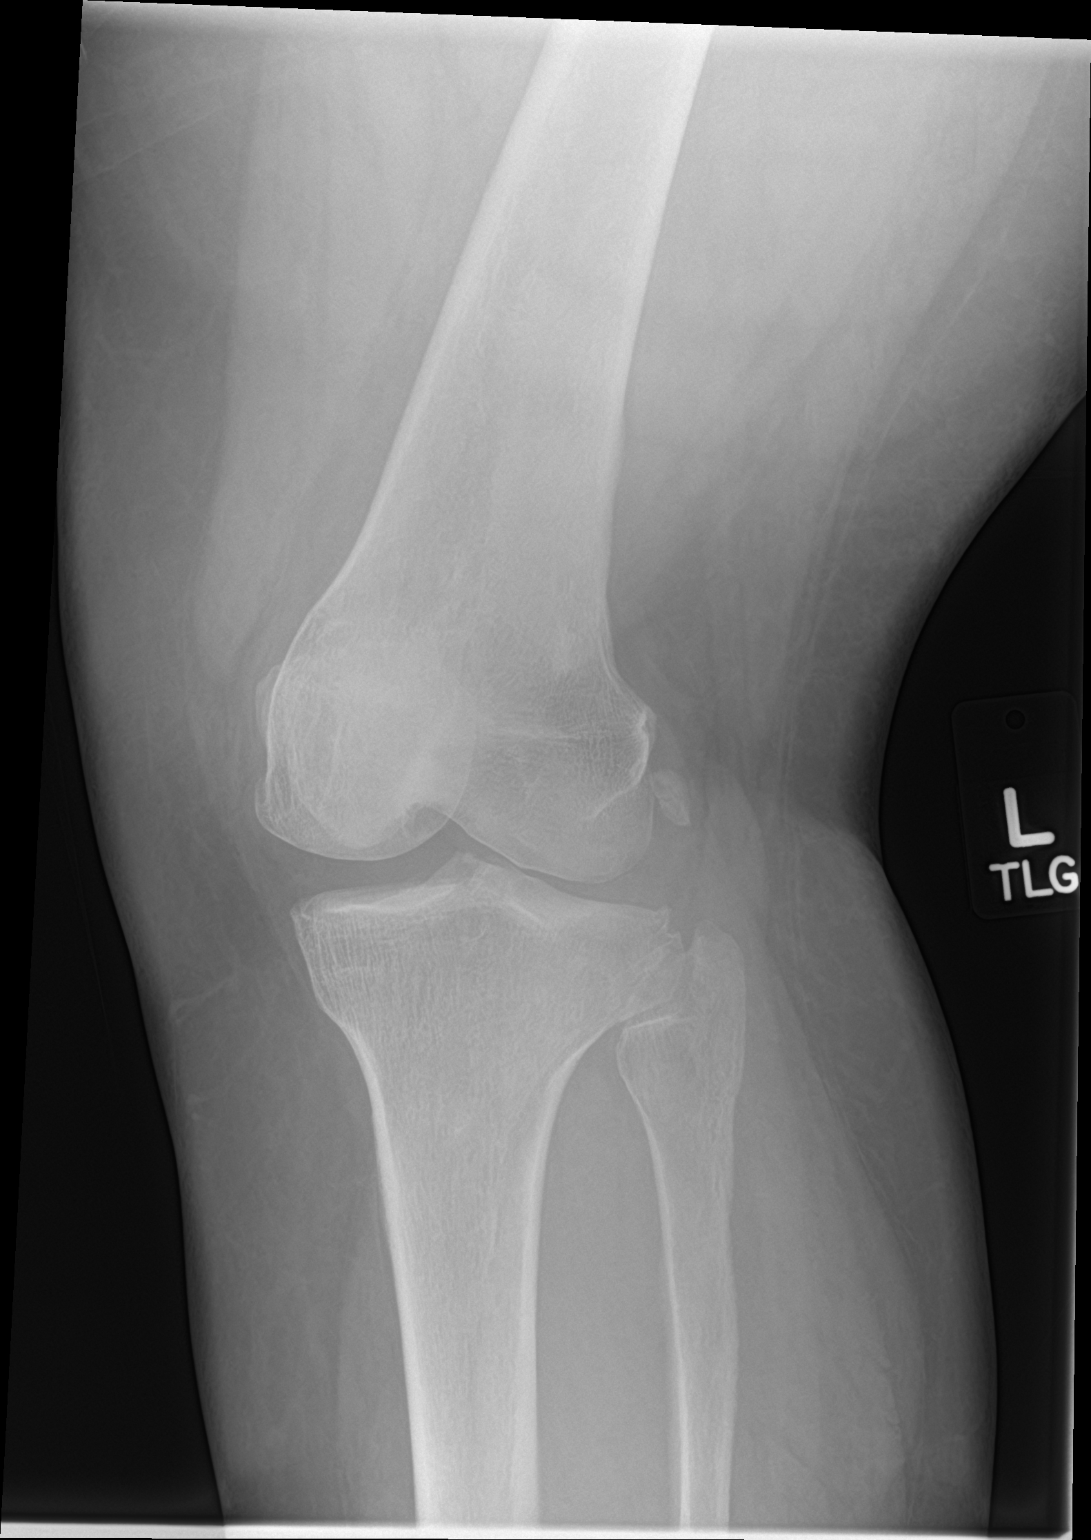

[2 of 2 positions shown; findings below may reference images not displayed]

FINDINGS: Very minor degenerative arthritic changes of the lateral and
patellofemoral compartments with bony spurring. Relatively preserved
joint spaces. Normal alignment without acute fracture or effusion.
No definite soft tissue abnormality.
IMPRESSION: Minor lateral and patellofemoral compartment early degenerative
changes. No acute osseous finding.

## 2017-06-20 DIAGNOSIS — M542 Cervicalgia: Secondary | ICD-10-CM | POA: Diagnosis not present

## 2017-06-21 ENCOUNTER — Telehealth: Payer: Self-pay | Admitting: Family Medicine

## 2017-06-21 NOTE — Telephone Encounter (Signed)
Pt is wondering if she needs to come in on 06/25/17, she was just here on 06/11/17. Please advise at [phone]

## 2017-06-22 LAB — POC HEMOCCULT BLD/STL (HOME/3-CARD/SCREEN)
Card #3 Fecal Occult Blood, POC: NEGATIVE
FECAL OCCULT BLD: NEGATIVE
FECAL OCCULT BLD: NEGATIVE

## 2017-06-22 NOTE — Telephone Encounter (Signed)
As long as she is feeling better, does not need to come.

## 2017-06-22 NOTE — Addendum Note (Signed)
Addended by: Ileana Roup on: 06/22/2017 08:34 AM   Modules accepted: Orders

## 2017-06-23 LAB — FECAL LACTOFERRIN, QUANT: Lactoferrin, Fecal, Quant.: 4.62 ug/mL(g) (ref 0.00–7.24)

## 2017-06-23 NOTE — Telephone Encounter (Signed)
Called pt to remind of appt for 06/25/17 and she was asking if she needed to come in. She stated she left a message asking this and I advised her of Dr. Raul Del note that if she was feeling better she didn't have to come in. The pt rescheduled for 07/09/17 because she wanted to wait to be seen until her lab results come back.

## 2017-06-25 ENCOUNTER — Ambulatory Visit: Payer: Medicare Other | Admitting: Family Medicine

## 2017-06-27 DIAGNOSIS — M79601 Pain in right arm: Secondary | ICD-10-CM | POA: Diagnosis not present

## 2017-06-27 DIAGNOSIS — M317 Microscopic polyangiitis: Secondary | ICD-10-CM | POA: Diagnosis not present

## 2017-06-27 DIAGNOSIS — F332 Major depressive disorder, recurrent severe without psychotic features: Secondary | ICD-10-CM | POA: Diagnosis not present

## 2017-06-27 DIAGNOSIS — F329 Major depressive disorder, single episode, unspecified: Secondary | ICD-10-CM | POA: Diagnosis not present

## 2017-06-27 DIAGNOSIS — M461 Sacroiliitis, not elsewhere classified: Secondary | ICD-10-CM | POA: Diagnosis not present

## 2017-06-27 DIAGNOSIS — Z79891 Long term (current) use of opiate analgesic: Secondary | ICD-10-CM | POA: Diagnosis not present

## 2017-06-27 DIAGNOSIS — K219 Gastro-esophageal reflux disease without esophagitis: Secondary | ICD-10-CM | POA: Diagnosis not present

## 2017-06-27 DIAGNOSIS — M545 Low back pain: Secondary | ICD-10-CM | POA: Diagnosis not present

## 2017-06-27 DIAGNOSIS — E669 Obesity, unspecified: Secondary | ICD-10-CM | POA: Diagnosis not present

## 2017-06-27 DIAGNOSIS — K30 Functional dyspepsia: Secondary | ICD-10-CM | POA: Diagnosis not present

## 2017-06-27 DIAGNOSIS — G4733 Obstructive sleep apnea (adult) (pediatric): Secondary | ICD-10-CM | POA: Diagnosis not present

## 2017-06-27 DIAGNOSIS — F4310 Post-traumatic stress disorder, unspecified: Secondary | ICD-10-CM | POA: Diagnosis not present

## 2017-06-27 DIAGNOSIS — K559 Vascular disorder of intestine, unspecified: Secondary | ICD-10-CM | POA: Diagnosis not present

## 2017-06-29 DIAGNOSIS — M79661 Pain in right lower leg: Secondary | ICD-10-CM | POA: Diagnosis not present

## 2017-06-29 DIAGNOSIS — R600 Localized edema: Secondary | ICD-10-CM | POA: Diagnosis not present

## 2017-06-29 DIAGNOSIS — Z0389 Encounter for observation for other suspected diseases and conditions ruled out: Secondary | ICD-10-CM | POA: Diagnosis not present

## 2017-06-29 DIAGNOSIS — Z1589 Genetic susceptibility to other disease: Secondary | ICD-10-CM | POA: Diagnosis not present

## 2017-06-29 DIAGNOSIS — M2550 Pain in unspecified joint: Secondary | ICD-10-CM | POA: Diagnosis not present

## 2017-06-29 DIAGNOSIS — M79669 Pain in unspecified lower leg: Secondary | ICD-10-CM | POA: Diagnosis not present

## 2017-06-29 DIAGNOSIS — M79662 Pain in left lower leg: Secondary | ICD-10-CM | POA: Diagnosis not present

## 2017-06-29 DIAGNOSIS — I517 Cardiomegaly: Secondary | ICD-10-CM | POA: Diagnosis not present

## 2017-07-09 ENCOUNTER — Encounter: Payer: Self-pay | Admitting: Family Medicine

## 2017-07-09 ENCOUNTER — Ambulatory Visit (INDEPENDENT_AMBULATORY_CARE_PROVIDER_SITE_OTHER): Payer: Medicare Other | Admitting: Family Medicine

## 2017-07-09 VITALS — BP 122/74 | HR 92 | Temp 97.8°F | Resp 16 | Ht 65.0 in | Wt 294.0 lb

## 2017-07-09 DIAGNOSIS — R601 Generalized edema: Secondary | ICD-10-CM | POA: Diagnosis not present

## 2017-07-09 DIAGNOSIS — F332 Major depressive disorder, recurrent severe without psychotic features: Secondary | ICD-10-CM

## 2017-07-09 DIAGNOSIS — N183 Chronic kidney disease, stage 3 unspecified: Secondary | ICD-10-CM

## 2017-07-09 DIAGNOSIS — I10 Essential (primary) hypertension: Secondary | ICD-10-CM

## 2017-07-09 DIAGNOSIS — F4310 Post-traumatic stress disorder, unspecified: Secondary | ICD-10-CM

## 2017-07-09 DIAGNOSIS — M317 Microscopic polyangiitis: Secondary | ICD-10-CM | POA: Diagnosis not present

## 2017-07-09 DIAGNOSIS — K580 Irritable bowel syndrome with diarrhea: Secondary | ICD-10-CM | POA: Diagnosis not present

## 2017-07-09 LAB — COMPREHENSIVE METABOLIC PANEL
ALBUMIN: 4.6 g/dL (ref 3.5–5.5)
ALK PHOS: 66 IU/L (ref 39–117)
ALT: 26 IU/L (ref 0–32)
AST: 19 IU/L (ref 0–40)
Albumin/Globulin Ratio: 1.7 (ref 1.2–2.2)
BILIRUBIN TOTAL: 0.3 mg/dL (ref 0.0–1.2)
BUN / CREAT RATIO: 22 (ref 9–23)
BUN: 29 mg/dL — ABNORMAL HIGH (ref 6–24)
CHLORIDE: 98 mmol/L (ref 96–106)
CO2: 24 mmol/L (ref 20–29)
Calcium: 9.8 mg/dL (ref 8.7–10.2)
Creatinine, Ser: 1.32 mg/dL — ABNORMAL HIGH (ref 0.57–1.00)
GFR calc Af Amer: 52 mL/min/{1.73_m2} — ABNORMAL LOW (ref >59)
GFR calc non Af Amer: 45 mL/min/{1.73_m2} — ABNORMAL LOW (ref >59)
GLOBULIN, TOTAL: 2.7 g/dL (ref 1.5–4.5)
GLUCOSE: 143 mg/dL — AB (ref 65–99)
Potassium: 4.6 mmol/L (ref 3.5–5.2)
SODIUM: 138 mmol/L (ref 134–144)
Total Protein: 7.3 g/dL (ref 6.0–8.5)

## 2017-07-09 LAB — CBC WITH DIFFERENTIAL/PLATELET
BASOS: 0 %
Basophils Absolute: 0 10*3/uL (ref 0.0–0.2)
EOS (ABSOLUTE): 0.1 10*3/uL (ref 0.0–0.4)
Eos: 2 %
HEMATOCRIT: 39.5 % (ref 34.0–46.6)
HEMOGLOBIN: 13.3 g/dL (ref 11.1–15.9)
Immature Grans (Abs): 0 10*3/uL (ref 0.0–0.1)
Immature Granulocytes: 0 %
LYMPHS ABS: 2.1 10*3/uL (ref 0.7–3.1)
Lymphs: 27 %
MCH: 30 pg (ref 26.6–33.0)
MCHC: 33.7 g/dL (ref 31.5–35.7)
MCV: 89 fL (ref 79–97)
MONOCYTES: 6 %
MONOS ABS: 0.5 10*3/uL (ref 0.1–0.9)
Neutrophils Absolute: 4.9 10*3/uL (ref 1.4–7.0)
Neutrophils: 65 %
Platelets: 296 10*3/uL (ref 150–379)
RBC: 4.44 x10E6/uL (ref 3.77–5.28)
RDW: 13.7 % (ref 12.3–15.4)
WBC: 7.6 10*3/uL (ref 3.4–10.8)

## 2017-07-09 LAB — POCT URINALYSIS DIP (MANUAL ENTRY)
BILIRUBIN UA: NEGATIVE mg/dL
Bilirubin, UA: NEGATIVE
Blood, UA: NEGATIVE
Glucose, UA: NEGATIVE mg/dL
Leukocytes, UA: NEGATIVE
Nitrite, UA: NEGATIVE
PH UA: 6 (ref 5.0–8.0)
PROTEIN UA: NEGATIVE mg/dL
SPEC GRAV UA: 1.015 (ref 1.010–1.025)
Urobilinogen, UA: 0.2 E.U./dL

## 2017-07-09 MED ORDER — LAMOTRIGINE 25 MG PO TABS: ORAL_TABLET | ORAL | 0 refills | Status: DC

## 2017-07-09 MED ORDER — FUROSEMIDE 20 MG PO TABS: 20.0000 mg | ORAL_TABLET | Freq: Every day | ORAL | 0 refills | Status: DC

## 2017-07-09 MED ORDER — OLOPATADINE HCL 0.7 % OP SOLN: 1.0000 [drp] | Freq: Every day | OPHTHALMIC | 11 refills | Status: DC

## 2017-07-09 MED ORDER — METOPROLOL SUCCINATE ER 100 MG PO TB24: 100.0000 mg | ORAL_TABLET | Freq: Every day | ORAL | 3 refills | Status: DC

## 2017-07-09 MED ORDER — SPIRONOLACTONE 100 MG PO TABS: 100.0000 mg | ORAL_TABLET | Freq: Every day | ORAL | 0 refills | Status: DC

## 2017-07-09 MED ORDER — SPIRONOLACTONE 50 MG PO TABS: 50.0000 mg | ORAL_TABLET | Freq: Every day | ORAL | 1 refills | Status: DC

## 2017-07-09 NOTE — Patient Instructions (Addendum)
     IF you received an x-ray today, you will receive an invoice from [facility]. Please contact [facility] at [phone] with questions or concerns regarding your invoice.   IF you received labwork today, you will receive an invoice from [facility]. Please contact [facility] at [phone] with questions or concerns regarding your invoice.   Our billing staff will not be able to assist you with questions regarding bills from these companies.  You will be contacted with the lab results as soon as they are available. The fastest way to get your results is to activate your My Chart account. Instructions are located on the last page of this paperwork. If you have not heard from [place] regarding the results in 2 weeks, please contact this office.    You can call the 24-hour [place] at [phone] or [phone] for immediate assistance. Among several different types of services, they offer an Intensive oupatient program for mood disorders - which is a group type setting Monday-Friday 9-noon.  You can schedule an assessment by calling the above numbers during which the costs for the program and insurance benefits will be reviewed.    No psychological or psychiatric services take physician referrals - they always want the patient to call. Some excellent private psychiatrists for individual counseling are:  [facility] [place]  [place], [place] [address] Phone: [phone]  [facility] in [place], [place] [address] [phone]  [facility] [place], [place] [address] [phone]  You can always go to [name] if suicidal or there are walk-in crisis services available at: [facility] [place], [place] [address] [phone]  Hour of operations: Monday-Friday, 8:30-5 p.m.  They take  medicare/medicaid and the patient can contact [facility] referral department at [phone] or [email or web address] for more information.

## 2017-07-09 NOTE — Progress Notes (Signed)
Subjective:    Patient ID: Valerie [name], female    DOB: 1960-12-20, 57 y.o.   MRN: [id number] Chief Complaint  Patient presents with  . GI Issues    & Kidney Follow up    HPI  Valerie [name] is a 57 yo woman here for a 1 month follow-up on her chronic medical conditions.  She has had an echo - looking for problems that the HLA B27 would cause - she knows she has degenerative disc disease and had a bone scan of her pelvis.  Wakes up with her stomach burning over the past few days - after she eats it gets better. Her bowels are "as normal as I can get." Her weight doesn't respond consistently to the lasix which is frustrating but taking the lasix every other day which is for the most part working to keep the fluid off. What she has dealt with the last 22 mos has caught up to her.Still seeing therapist regularly - last on the 16th.   She is wondering if she does have PMR (like Dr. Joseph Art suspected) as her initial sxs seem to be resolved in her larger muscle groups. Still with some pain in the lower right leg occasionally. Wanted to see what her baseline was without the methotrexate.  She felt like all hell broke loose after the spironolactone was pulled. Has never taken the xanax regularly but is taking it more freq over the last few months with the hydroxyzine as more irritability.  She was rx'd lamictal for a time which did help for a while Takes a lot before she gets to the point where she doesn't want to keep on fighting.  Chills the other night Thyroid occasionally tender. Several episodes of heart beating harder - not faster - which is what she feels like when she runs out of toprol.  Past Medical History:  Diagnosis Date  . Acute ischemic colitis ([place])   . Anemia 05/01/2012  . Anxiety   . ARF (acute renal failure) ([place]) 12/28/2015  . Arthritis    "basically ~ q joint" (12/28/2015)  . Asthma   . Basal cell carcinoma of upper back ~ 2011  . Chronic right-sided headaches    and neck pain, relieved wiht PT and chronic practic manipulation  . DDD (degenerative disc disease), lumbar   . Diarrhea    EPISODIC  . Fatigue    h/o excessive sedation, etiology unknown  . Fibromyalgia   . First degree heart block   . GERD (gastroesophageal reflux disease) 04/06/2017  . Headache(784.0)    "muscular w/migraine intensity; ~ 5-6 months" (12/28/2015)  . History of ischemic colitis 09/24/2016  . Hypertension   . Mental disorder    reports PTSD  . Microscopic polyangiitis ([place])   . Nerve pain 2011   nerve damage to right arm; normal EMG/NCV to RUExt after shoulder surgery 12/2009  . Neuromuscular disorder ([place])   . Pancreatitis    due to Effexor  . Pneumonia ~ 2014; 08/2015   Aspiration  . Polymyalgia rheumatica ([place]) onset 08/2015   changed diagnosis to MPA  . Precancerous melanosis ([place])    "2nd toe left foot; 5th toe of right foot; right temple; had them all cut off"  . Sleep apnea    CPAP-9   Past Surgical History:  Procedure Laterality Date  . BASAL CELL CARCINOMA EXCISION  ~ 2011   back  . BOTOX INJECTION N/A 11/06/2012   Procedure: BOTOX INJECTION;  Surgeon: Missy Sabins,  MD;  Location: [facility];  Service: Endoscopy;  Laterality: N/A;  . BOTOX INJECTION N/A 12/04/2013   Procedure: BOTOX INJECTION;  Surgeon: Missy Sabins, MD;  Location: [facility];  Service: Endoscopy;  Laterality: N/A;  . BOTOX INJECTION N/A 02/26/2014   Procedure: BOTOX INJECTION;  Surgeon: Missy Sabins, MD;  Location: [facility];  Service: Endoscopy;  Laterality: N/A;  . BOTOX INJECTION N/A 07/08/2015   Procedure: BOTOX INJECTION;  Surgeon: Teena Irani, MD;  Location: [facility];  Service: Endoscopy;  Laterality: N/A;  . BOTOX INJECTION N/A 04/27/2016   Procedure: BOTOX INJECTION;  Surgeon: Teena Irani, MD;  Location: [facility];  Service: Endoscopy;  Laterality: N/A;  . BOTOX INJECTION N/A 04/09/2017   Procedure: BOTOX INJECTION;  Surgeon: Clarene Essex, MD;  Location: [place] [facility];   Service: Endoscopy;  Laterality: N/A;  . ESOPHAGOGASTRODUODENOSCOPY  12/21/2011   Procedure: ESOPHAGOGASTRODUODENOSCOPY (EGD);  Surgeon: Missy Sabins, MD;  Location[facility] END[facility];  Service: Endoscopy;  Laterality: N/A;  . ESOPHAGOGASTRODUODENOSCOPY N/A 11/06/2012   Procedure: ESOPHAGOGASTRODUODENOSCOPY (EGD);  Surgeon: Missy Sabins, MD;  Location: [facility];  Service: Endoscopy;  Laterality: N/A;  . ESOPHAGOGASTRODUODENOSCOPY N/A 12/04/2013   Procedure: ESOPHAGOGASTRODUODENOSCOPY (EGD);  Surgeon: Missy Sabins, MD;  [facility];  Service: Endoscopy;  Laterality: N/A;  . ESOPHAGOGASTRODUODENOSCOPY N/A 02/26/2014   Procedure: ESOPHAGOGASTRODUODENOSCOPY (EGD);  Surgeon: Missy Sabins, MD;  Location: [facility] ENDOSCOPY;  Service: Endoscopy;  Laterality: N/A;  . ESOPHAGOGASTRODUODENOSCOPY N/A 04/09/2017   Procedure: ESOPHAGOGASTRODUODENOSCOPY (EGD);  Surgeon: Clarene Essex, MD;  Location: [facility];  Service: Endoscopy;  Laterality: N/A;  . ESOPHAGOGASTRODUODENOSCOPY (EGD) WITH PROPOFOL N/A 07/08/2015   Procedure: ESOPHAGOGASTRODUODENOSCOPY (EGD) WITH PROPOFOL;  Surgeon: Teena Irani, MD;  [facility];  Service: Endoscopy;  Laterality: N/A;  . ESOPHAGOGASTRODUODENOSCOPY (EGD) WITH PROPOFOL N/A 04/27/2016   Procedure: ESOPHAGOGASTRODUODENOSCOPY (EGD) WITH PROPOFOL;  Surgeon: Teena Irani, MD;  [facility];  Service: Endoscopy;  Laterality: N/A;  . FLEXIBLE SIGMOIDOSCOPY  05/01/2012   Procedure: FLEXIBLE SIGMOIDOSCOPY;  Surgeon: Jeryl Columbia, MD;  Location: [facility];  Service: Endoscopy;  Laterality: N/A;  unprepped  . FLEXIBLE SIGMOIDOSCOPY Left 09/28/2016   Procedure: FLEXIBLE SIGMOIDOSCOPY;  Surgeon: Wilford Corner, MD;  Location: [facility];  Service: Endoscopy;  Laterality: Left;  . LAPAROSCOPIC CHOLECYSTECTOMY  01/2002  . NASAL SEPTOPLASTY W/ TURBINOPLASTY  1998  . SHOULDER ARTHROSCOPY WITH ROTATOR CUFF REPAIR AND OPEN BICEPS TENODESIS Bilateral 2010-2013   left-right     Current Outpatient Medications on File Prior to Visit  Medication Sig Dispense Refill  . albuterol (PROAIR HFA) 108 (90 Base) MCG/ACT inhaler Inhale 2 puffs every 4 (four) hours as needed into the lungs for wheezing or shortness of breath. 8.5 each 11  . allopurinol (ZYLOPRIM) 300 MG tablet Take 0.5 tablets (150 mg total) by mouth daily. 90 tablet 1  . ALPRAZolam (XANAX) 1 MG tablet Take 1 tablet (1 mg total) 3 (three) times daily as needed by mouth for anxiety. 90 tablet 1  . baclofen (LIORESAL) 10 MG tablet Take 10 mg by mouth 3 (three) times daily. Takes as needed    . benazepril (LOTENSIN) 20 MG tablet Take 1 tablet (20 mg total) daily by mouth. 90 tablet 3  . calcium carbonate (OSCAL) 1500 (600 Ca) MG TABS tablet Take 600 mg of elemental calcium by mouth daily with breakfast.    . cetirizine (ZYRTEC) 10 MG tablet Take 10 mg by mouth daily. Alternates between Allegra and Zyrtec as needed for allergies    .  Cholecalciferol (VITAMIN D3) 5000 units CAPS Take 1 capsule by mouth.    . dexlansoprazole (DEXILANT) 60 MG capsule Take 60 mg by mouth 2 (two) times daily.    . fexofenadine (ALLEGRA) 180 MG tablet Take 180 mg by mouth daily as needed. Switches between Allegra and Zyrtec as needed for allergies    . fluticasone (FLONASE) 50 MCG/ACT nasal spray Place 2 sprays into both nostrils daily. 48 g 2  . folic acid (FOLVITE) 1 MG tablet Take 400 mcg by mouth daily.     . furosemide (LASIX) 40 MG tablet Take 1 tablet (40 mg total) daily by mouth. As needed For edema 90 tablet 0  . HYDROcodone-acetaminophen (NORCO) 10-325 MG per tablet Take 1-2 tablets by mouth every 6 (six) hours as needed for moderate pain (pain.).     [place] hydrOXYzine (ATARAX/VISTARIL) 10 MG tablet Take 1-3 tablets (10-30 mg total) 3 (three) times daily as needed by mouth. 90 tablet 1  . ipratropium (ATROVENT) 0.03 % nasal spray Place 2 sprays 4 (four) times daily into the nose. 30 mL 1  . metoprolol succinate (TOPROL-XL) 100 MG 24  hr tablet Take 1 tablet (100 mg total) by mouth daily. Take with or immediately following a meal. 90 tablet 3  . modafinil (PROVIGIL) 200 MG tablet Take 0.5 tablets (100 mg total) by mouth 2 (two) times daily. 90 tablet 1  . montelukast (SINGULAIR) 10 MG tablet TAKE 1 TABLET (10 MG TOTAL) BY MOUTH AT BEDTIME 90 tablet 3  . morphine (MS CONTIN) 30 MG 12 hr tablet Take 30 mg by mouth every 8 (eight) hours. Not from [place]    . Multiple Vitamins-Minerals (MULTIVITAMIN PO) Take 1 tablet by mouth daily.    . Olopatadine HCl (PAZEO) 0.7 % SOLN Apply 1 drop to eye daily. 2.5 mL 11  . ondansetron (ZOFRAN) 4 MG tablet Take 1 tablet (4 mg total) by mouth every 8 (eight) hours as needed for nausea or vomiting. 20 tablet 0  . Pancrelipase, Lip-Prot-Amyl, (CREON) 24000 UNITS CPEP Take 72,000 Units by mouth 3 (three) times daily as needed (for pancreatitis).     . promethazine (PHENERGAN) 25 MG tablet Take 1 tablet (25 mg total) by mouth every 6 (six) hours as needed for nausea or vomiting. 30 tablet 1  . spironolactone (ALDACTONE) 100 MG tablet Take 1 tablet (100 mg total) by mouth daily. (Patient taking differently: Take 50 mg by mouth daily. ) 90 tablet 3  . sucralfate (CARAFATE) 1 GM/10ML suspension Take 10 mLs (1 g total) by mouth 4 (four) times daily as needed (stomach pain). 420 mL 1   No current facility-administered medications on file prior to visit.    Allergies  Allergen Reactions  . Dilaudid [Hydromorphone Hcl] Other (See Comments)    Headache   . Effexor [Venlafaxine]     pancreatitis  . Pregabalin Other (See Comments)    Drug induced stupor  . Metoclopramide Hcl Other (See Comments)    "crawling inside"  . Neurontin [Gabapentin] Other (See Comments)    Tremors   . Other Other (See Comments)    Avoid all SSRI's and SNRI's- damaged pancreas  . Serotonin Reuptake Inhibitors (Ssris) Other (See Comments)    Damage to pancreas and bowel/urinary incontinence    Family History  Problem Relation  Age of Onset  . Heart disease Mother   . COPD Mother   . Arthritis Mother   . Cancer Mother   . Hypertension Mother   . Arthritis Paternal  Grandmother   . Heart disease Paternal Grandmother   . Heart disease Paternal Grandfather    Social History   Socioeconomic History  . Marital status: Single    Spouse name: None  . Number of children: 0  . Years of education: College  . Highest education level: None  Social Needs  . Financial resource strain: None  . Food insecurity - worry: None  . Food insecurity - inability: None  . Transportation needs - medical: None  . Transportation needs - non-medical: None  Occupational History  . Occupation: disabled  Tobacco Use  . Smoking status: Never Smoker  . Smokeless tobacco: Never Used  Substance and Sexual Activity  . Alcohol use: No  . Drug use: No  . Sexual activity: Not Currently  Other Topics Concern  . None  Social History Narrative   Caffeine 2 cups iced tea daily avg.   Depression screen [facility] 2/9 04/23/2017 02/19/2017 01/29/2017 11/25/2016 11/17/2016  Decreased Interest 0 0 0 1 2  Down, Depressed, Hopeless 0 0 0 1 2  PHQ - 2 Score 0 0 0 2 4  Altered sleeping - - - 0 2  Tired, decreased energy - - - 0 2  Change in appetite - - - 0 2  Feeling bad or failure about yourself  - - - 0 1  Trouble concentrating - - - 0 2  Moving slowly or fidgety/restless - - - 0 1  Suicidal thoughts - - - 0 0  PHQ-9 Score - - - 2 14  Difficult doing work/chores - - - Somewhat difficult Very difficult  Some recent data might be hidden     Review of Systems  Constitutional: Positive for activity change, appetite change, chills, fatigue and unexpected weight change (variable for no decernible reason). Negative for fever.  Cardiovascular: Positive for palpitations and leg swelling.  Gastrointestinal: Positive for abdominal pain. Negative for vomiting.  Genitourinary: Positive for flank pain.  Musculoskeletal: Positive for arthralgias and back  pain. Negative for gait problem and myalgias.  Skin: Negative for rash.  Neurological: Positive for weakness, numbness and headaches.  Hematological: Positive for adenopathy.  Psychiatric/Behavioral: Positive for behavioral problems, decreased concentration, dysphoric mood and sleep disturbance. Negative for self-injury and suicidal ideas. The patient is nervous/anxious.    See hpi    Objective:   Physical Exam  Constitutional: She is oriented to person, place, and time. She appears well-developed and well-nourished. No distress.  HENT:  Head: Normocephalic and atraumatic.  Right Ear: External ear normal.  Left Ear: External ear normal.  Eyes: Conjunctivae are normal. No scleral icterus.  Neck: Normal range of motion. Neck supple. No thyromegaly present.  Cardiovascular: Normal rate, regular rhythm, normal heart sounds and intact distal pulses.  Pulmonary/Chest: Effort normal and breath sounds normal. No respiratory distress.  Musculoskeletal: She exhibits no edema.  Lymphadenopathy:    She has no cervical adenopathy.  Neurological: She is alert and oriented to person, place, and time.  Skin: Skin is warm and dry. She is not diaphoretic. No erythema.  Psychiatric: Her speech is normal and behavior is normal. Judgment normal. Cognition and memory are normal. She exhibits a depressed mood. She expresses no homicidal and no suicidal ideation. She expresses no suicidal plans and no homicidal plans.  Tearful at time. No SI due to faith but makes several statements alluding to "wish I wasn't here", "which it would all just end".    BP 122/74   Pulse 92   Temp 97.8  F (36.6 C) (Oral)   Resp 16   Ht _0  (1.651 m)   Wt 294 lb (133.4 kg)   SpO2 98%   BMI 48.92 kg/m      Assessment & Plan:   1. Microscopic polyangiitis ([place])   2. Essential hypertension - cont toprol  3. Irritable bowel syndrome with diarrhea   4. CKD (chronic kidney disease) stage 3, GFR 30-59 ml/min (H[facility])   5.  PTSD (post-traumatic stress disorder) - refuses anything that might poss increase serotonin due to h/o serotonin syndrome but states she had a positive result on lamictal prior so brings up that she would be willing to try that. Aware of need to gradually wean up on lamictal and RTC immed if any rash. Has been on xanax tid for many yrs but uses less than rx'd though more recently.  VERY reluctant to see psychiatrist. Works with therapist out of town for [place] yrs.  6. Severe episode of recurrent major depressive disorder, without psychotic features ([place])   7.      Generalized edema - recalls feeling better on spironolactone 100 (currently on 50 as had recurrent hyperkalemia on 100). Taking lasix 40 qod but feels the fluid status is bouncing around to much, to variable - so change to lasix 20 qd and retry increasing spironolactone back to 100 now that lasix will hopefully balance K.    Orders Placed This Encounter  Procedures  . CBC with Differential/Platelet  . Comprehensive metabolic panel  . Ambulatory referral to Psychiatry    Referral Priority:   Routine    Referral Type:   Psychiatric    Requested Specialty:   Psychiatry    Number of Visits Requested:   1  . POCT urinalysis dipstick    Meds ordered this encounter  Medications  . DISCONTD: spironolactone (ALDACTONE) 50 MG tablet    Sig: Take 1 tablet (50 mg total) by mouth daily.    Dispense:  90 tablet    Refill:  1  . furosemide (LASIX) 20 MG tablet    Sig: Take 1 tablet (20 mg total) by mouth daily.    Dispense:  90 tablet    Refill:  0  . lamoTRIgine (LAMICTAL) 25 MG tablet    Sig: Take 1 tablet (25 mg total) by mouth daily for 14 days, THEN 2 tablets (50 mg total) daily for 14 days, THEN 4 tablets (100 mg total) daily for 7 days.    Dispense:  70 tablet    Refill:  0  . spironolactone (ALDACTONE) 100 MG tablet    Sig: Take 1 tablet (100 mg total) by mouth daily.    Dispense:  90 tablet    Refill:  0    D/c prior 37m dose  just sent in  . metoprolol succinate (TOPROL-XL) 100 MG 24 hr tablet    Sig: Take 1 tablet (100 mg total) by mouth daily. Take with or immediately following a meal.    Dispense:  90 tablet    Refill:  3  . Olopatadine HCl (PAZEO) 0.7 % SOLN    Sig: Apply 1 drop to eye daily.    Dispense:  2.5 mL    Refill:  11    EDelman Cheadle M.D.  [facility] [phone]  07/11/17 11:58 PM

## 2017-07-09 NOTE — Telephone Encounter (Signed)
Valerie [name] was here for a visit on 07/09/2017. She is wanting to get an answer about a question she has about her medication. Her telephone number on file is correct [phone].

## 2017-08-09 ENCOUNTER — Other Ambulatory Visit: Payer: Self-pay

## 2017-08-09 ENCOUNTER — Encounter: Payer: Self-pay | Admitting: Family Medicine

## 2017-08-09 ENCOUNTER — Ambulatory Visit (INDEPENDENT_AMBULATORY_CARE_PROVIDER_SITE_OTHER): Payer: Medicare Other | Admitting: Family Medicine

## 2017-08-09 VITALS — BP 136/84 | HR 87 | Temp 98.6°F | Resp 18 | Ht 65.0 in | Wt 297.8 lb

## 2017-08-09 DIAGNOSIS — F332 Major depressive disorder, recurrent severe without psychotic features: Secondary | ICD-10-CM

## 2017-08-09 DIAGNOSIS — R109 Unspecified abdominal pain: Secondary | ICD-10-CM

## 2017-08-09 DIAGNOSIS — N183 Chronic kidney disease, stage 3 unspecified: Secondary | ICD-10-CM

## 2017-08-09 DIAGNOSIS — R601 Generalized edema: Secondary | ICD-10-CM | POA: Diagnosis not present

## 2017-08-09 DIAGNOSIS — Z5181 Encounter for therapeutic drug level monitoring: Secondary | ICD-10-CM

## 2017-08-09 LAB — POCT CBC
Granulocyte percent: 62.6 %G (ref 37–80)
HCT, POC: 41 % (ref 37.7–47.9)
HEMOGLOBIN: 13.5 g/dL (ref 12.2–16.2)
Lymph, poc: 2.5 (ref 0.6–3.4)
MCH: 29.7 pg (ref 27–31.2)
MCHC: 33 g/dL (ref 31.8–35.4)
MCV: 90.1 fL (ref 80–97)
MID (cbc): 0.2 (ref 0–0.9)
MPV: 7.9 fL (ref 0–99.8)
PLATELET COUNT, POC: 287 10*3/uL (ref 142–424)
POC Granulocyte: 4.5 (ref 2–6.9)
POC LYMPH PERCENT: 34.5 %L (ref 10–50)
POC MID %: 2.9 % (ref 0–12)
RBC: 4.55 M/uL (ref 4.04–5.48)
RDW, POC: 13.3 %
WBC: 7.2 10*3/uL (ref 4.6–10.2)

## 2017-08-09 LAB — POCT URINALYSIS DIP (MANUAL ENTRY)
BILIRUBIN UA: NEGATIVE
Glucose, UA: NEGATIVE mg/dL
Ketones, POC UA: NEGATIVE mg/dL
Leukocytes, UA: NEGATIVE
NITRITE UA: NEGATIVE
PH UA: 6 (ref 5.0–8.0)
Protein Ur, POC: NEGATIVE mg/dL
RBC UA: NEGATIVE
Urobilinogen, UA: 0.2 E.U./dL

## 2017-08-09 LAB — BASIC METABOLIC PANEL
BUN/Creatinine Ratio: 20 (ref 9–23)
BUN: 21 mg/dL (ref 6–24)
CALCIUM: 9.7 mg/dL (ref 8.7–10.2)
CHLORIDE: 98 mmol/L (ref 96–106)
CO2: 24 mmol/L (ref 20–29)
Creatinine, Ser: 1.07 mg/dL — ABNORMAL HIGH (ref 0.57–1.00)
GFR calc non Af Amer: 59 mL/min/{1.73_m2} — ABNORMAL LOW (ref >59)
GFR, EST AFRICAN AMERICAN: 68 mL/min/{1.73_m2} (ref >59)
Glucose: 133 mg/dL — ABNORMAL HIGH (ref 65–99)
POTASSIUM: 4.6 mmol/L (ref 3.5–5.2)
SODIUM: 138 mmol/L (ref 134–144)

## 2017-08-09 LAB — POC MICROSCOPIC URINALYSIS (UMFC): Mucus: ABSENT

## 2017-08-09 MED ORDER — TORSEMIDE 10 MG PO TABS: 10.0000 mg | ORAL_TABLET | Freq: Every day | ORAL | 1 refills | Status: DC

## 2017-08-09 NOTE — Patient Instructions (Addendum)
   IF you received an x-ray today, you will receive an invoice from [facility]. Please contact [facility] at [phone] with questions or concerns regarding your invoice.   IF you received labwork today, you will receive an invoice from [facility]. Please contact [facility] at [phone] with questions or concerns regarding your invoice.   Our billing staff will not be able to assist you with questions regarding bills from these companies.  You will be contacted with the lab results as soon as they are available. The fastest way to get your results is to activate your My Chart account. Instructions are located on the last page of this paperwork. If you have not heard from us regarding the results in 2 weeks, please contact this office.      Edema Edema is an abnormal buildup of fluids in your bodytissues. Edema is somewhatdependent on gravity to pull the fluid to the lowest place in your body. That makes the condition more common in the legs and thighs (lower extremities). Painless swelling of the feet and ankles is common and becomes more likely as you get older. It is also common in looser tissues, like around your eyes. When the affected area is squeezed, the fluid may move out of that spot and leave a dent for a few moments. This dent is called pitting. What are the causes? There are many possible causes of edema. Eating too much salt and being on your feet or sitting for a long time can cause edema in your legs and ankles. Hot weather may make edema worse. Common medical causes of edema include:  Heart failure.  Liver disease.  Kidney disease.  Weak blood vessels in your legs.  Cancer.  An injury.  Pregnancy.  Some medications.  Obesity.  What are the signs or symptoms? Edema is usually painless.Your skin may look swollen or shiny. How is this diagnosed? Your health care provider may be able to diagnose edema by asking about your medical history  and doing a physical exam. You may need to have tests such as X-rays, an electrocardiogram, or blood tests to check for medical conditions that may cause edema. How is this treated? Edema treatment depends on the cause. If you have heart, liver, or kidney disease, you need the treatment appropriate for these conditions. General treatment may include:  Elevation of the affected body part above the level of your heart.  Compression of the affected body part. Pressure from elastic bandages or support stockings squeezes the tissues and forces fluid back into the blood vessels. This keeps fluid from entering the tissues.  Restriction of fluid and salt intake.  Use of a water pill (diuretic). These medications are appropriate only for some types of edema. They pull fluid out of your body and make you urinate more often. This gets rid of fluid and reduces swelling, but diuretics can have side effects. Only use diuretics as directed by your health care provider.  Follow these instructions at home:  Keep the affected body part above the level of your heart when you are lying down.  Do not sit still or stand for prolonged periods.  Do not put anything directly under your knees when lying down.  Do not wear constricting clothing or garters on your upper legs.  Exercise your legs to work the fluid back into your blood vessels. This may help the swelling go down.  Wear elastic bandages or support stockings to reduce ankle swelling as directed by your health   care provider.  Eat a low-salt diet to reduce fluid if your health care provider recommends it.  Only take medicines as directed by your health care provider. Contact a health care provider if:  Your edema is not responding to treatment.  You have heart, liver, or kidney disease and notice symptoms of edema.  You have edema in your legs that does not improve after elevating them.  You have sudden and unexplained weight gain. Get help  right away if:  You develop shortness of breath or chest pain.  You cannot breathe when you lie down.  You develop pain, redness, or warmth in the swollen areas.  You have heart, liver, or kidney disease and suddenly get edema.  You have a fever and your symptoms suddenly get worse. This information is not intended to replace advice given to you by your health care provider. Make sure you discuss any questions you have with your health care provider. Document Released: 05/29/2005 Document Revised: 11/04/2015 Document Reviewed: 03/21/2013 Elsevier Interactive Patient Education  2017 [employer]  

## 2017-08-09 NOTE — Progress Notes (Signed)
Subjective:  By signing my name below, I, Valerie [name], attest that this documentation has been prepared under the direction and in the presence of Delman Cheadle, MD Electronically Signed: Ladene Artist, ED Scribe 08/09/2017 at 10:13 AM.   Patient ID: Valerie [name], female    DOB: 05-Dec-1960, 57 y.o.   MRN: [id number]  Chief Complaint  Patient presents with  . Kidney Issue  . Mood    Depression scale score 0, GAD 7 score 5  . Follow-up   HPI Valerie [name] is a 57 y.o. female who presents to Primary Care at [facility] for f/u. At last visit we started pt on Lamictal which she did well on prior (pt never started) and changed increased spironolactone from 50 to 100, Lasix from 40 every other day to 20 daily (which she has changed to qod).  Pt states that she is still having some tightness but edema has improved some. She states that she went to Lasix daily x 1 wk but noticed R flank pain. She backed lasix off to every other day which she states improved pain, but noticed that flank pain worsened 3 days ago when she started taking the lasix 20 daily again. She has also noticed nausea and HA since last night. Pt reports experiencing leg swelling in 2012 after taking Toradol which resolved with Demadex.  Anxiety/Depression Pt states that she called her psychologist who she is worked with for MANY years to discuss whether she is experiencing depression, anxiety or just a build-up of frustration. States she has some days where everything is fine but then she has days where she is just extremely frustrated, more so than depressed. She decided against starting Lamictal. Pt thought it was best to increase hydroxyzine and xanax as she was using both only once every 5 days. Reports some difficulty sleeping; states she doesn't fall asleep until 3-4 AM due to her mind racing. She has tried 1-2 hydroxyzine tabs, rarely 3, xanax and melatonin. She has an appointment with a psychiatrist at [facility] in April.  Past  Medical History:  Diagnosis Date  . Acute ischemic colitis ([place])   . Anemia 05/01/2012  . Anxiety   . ARF (acute renal failure) ([place]) 12/28/2015  . Arthritis    "basically ~ q joint" (12/28/2015)  . Asthma   . Basal cell carcinoma of upper back ~ 2011  . Chronic right-sided headaches    and neck pain, relieved wiht PT and chronic practic manipulation  . DDD (degenerative disc disease), lumbar   . Diarrhea    EPISODIC  . Fatigue    h/o excessive sedation, etiology unknown  . Fibromyalgia   . First degree heart block   . GERD (gastroesophageal reflux disease) 04/06/2017  . Headache(784.0)    "muscular w/migraine intensity; ~ 5-6 months" (12/28/2015)  . History of ischemic colitis 09/24/2016  . Hypertension   . Mental disorder    reports PTSD  . Microscopic polyangiitis ([place])   . Nerve pain 2011   nerve damage to right arm; normal EMG/NCV to RUExt after shoulder surgery 12/2009  . Neuromuscular disorder ([place])   . Pancreatitis    due to Effexor  . Pneumonia ~ 2014; 08/2015   Aspiration  . Polymyalgia rheumatica ([place]) onset 08/2015   changed diagnosis to MPA  . Precancerous melanosis ([place])    "2nd toe left foot; 5th toe of right foot; right temple; had them all cut off"  . Sleep apnea    CPAP-9   Current  Outpatient Medications on File Prior to Visit  Medication Sig Dispense Refill  . albuterol (PROAIR HFA) 108 (90 Base) MCG/ACT inhaler Inhale 2 puffs every 4 (four) hours as needed into the lungs for wheezing or shortness of breath. 8.5 each 11  . allopurinol (ZYLOPRIM) 300 MG tablet Take 0.5 tablets (150 mg total) by mouth daily. 90 tablet 1  . ALPRAZolam (XANAX) 1 MG tablet Take 1 tablet (1 mg total) 3 (three) times daily as needed by mouth for anxiety. 90 tablet 1  . baclofen (LIORESAL) 10 MG tablet Take 10 mg by mouth 3 (three) times daily. Takes as needed    . benazepril (LOTENSIN) 20 MG tablet Take 1 tablet (20 mg total) daily by mouth. 90 tablet 3  . calcium carbonate  (OSCAL) 1500 (600 Ca) MG TABS tablet Take 600 mg of elemental calcium by mouth daily with breakfast.    . cetirizine (ZYRTEC) 10 MG tablet Take 10 mg by mouth daily. Alternates between Allegra and Zyrtec as needed for allergies    . Cholecalciferol (VITAMIN D3) 5000 units CAPS Take 1 capsule by mouth.    . dexlansoprazole (DEXILANT) 60 MG capsule Take 60 mg by mouth 2 (two) times daily.    . fexofenadine (ALLEGRA) 180 MG tablet Take 180 mg by mouth daily as needed. Switches between Allegra and Zyrtec as needed for allergies    . fluticasone (FLONASE) 50 MCG/ACT nasal spray Place 2 sprays into both nostrils daily. 48 g 2  . furosemide (LASIX) 20 MG tablet Take 1 tablet (20 mg total) by mouth daily. 90 tablet 0  . HYDROcodone-acetaminophen (NORCO) 10-325 MG per tablet Take 1-2 tablets by mouth every 6 (six) hours as needed for moderate pain (pain.).     Marland Kitchen hydrOXYzine (ATARAX/VISTARIL) 10 MG tablet Take 1-3 tablets (10-30 mg total) 3 (three) times daily as needed by mouth. 90 tablet 1  . ipratropium (ATROVENT) 0.03 % nasal spray Place 2 sprays 4 (four) times daily into the nose. 30 mL 1  . lamoTRIgine (LAMICTAL) 25 MG tablet Take 1 tablet (25 mg total) by mouth daily for 14 days, THEN 2 tablets (50 mg total) daily for 14 days, THEN 4 tablets (100 mg total) daily for 7 days. 70 tablet 0  . metoprolol succinate (TOPROL-XL) 100 MG 24 hr tablet Take 1 tablet (100 mg total) by mouth daily. Take with or immediately following a meal. 90 tablet 3  . modafinil (PROVIGIL) 200 MG tablet Take 0.5 tablets (100 mg total) by mouth 2 (two) times daily. 90 tablet 1  . montelukast (SINGULAIR) 10 MG tablet TAKE 1 TABLET (10 MG TOTAL) BY MOUTH AT BEDTIME 90 tablet 3  . morphine (MS CONTIN) 30 MG 12 hr tablet Take 30 mg by mouth every 8 (eight) hours. Not from [place]    . Multiple Vitamins-Minerals (MULTIVITAMIN PO) Take 1 tablet by mouth daily.    . Olopatadine HCl (PAZEO) 0.7 % SOLN Apply 1 drop to eye daily. 2.5 mL 11  .  ondansetron (ZOFRAN) 4 MG tablet Take 1 tablet (4 mg total) by mouth every 8 (eight) hours as needed for nausea or vomiting. 20 tablet 0  . Pancrelipase, Lip-Prot-Amyl, (CREON) 24000 UNITS CPEP Take 72,000 Units by mouth 3 (three) times daily as needed (for pancreatitis).     . promethazine (PHENERGAN) 25 MG tablet Take 1 tablet (25 mg total) by mouth every 6 (six) hours as needed for nausea or vomiting. 30 tablet 1  . spironolactone (ALDACTONE) 100 MG tablet  Take 1 tablet (100 mg total) by mouth daily. 90 tablet 0  . sucralfate (CARAFATE) 1 GM/10ML suspension Take 10 mLs (1 g total) by mouth 4 (four) times daily as needed (stomach pain). 420 mL 1   No current facility-administered medications on file prior to visit.    Allergies  Allergen Reactions  . Dilaudid [Hydromorphone Hcl] Other (See Comments)    Headache   . Effexor [Venlafaxine]     pancreatitis  . Pregabalin Other (See Comments)    Drug induced stupor  . Metoclopramide Hcl Other (See Comments)    "crawling inside"  . Neurontin [Gabapentin] Other (See Comments)    Tremors   . Other Other (See Comments)    Avoid all SSRI's and SNRI's- damaged pancreas  . Serotonin Reuptake Inhibitors (Ssris) Other (See Comments)    Damage to pancreas and bowel/urinary incontinence    Past Surgical History:  Procedure Laterality Date  . BASAL CELL CARCINOMA EXCISION  ~ 2011   back  . BOTOX INJECTION N/A 11/06/2012   Procedure: BOTOX INJECTION;  Surgeon: Missy Sabins, MD;  Location: [facility];  Service: Endoscopy;  Laterality: N/A;  . BOTOX INJECTION N/A 12/04/2013   Procedure: BOTOX INJECTION;  Surgeon: Missy Sabins, MD;  Location: [place];  Service: Endoscopy;  Laterality: N/A;  . BOTOX INJECTION N/A 02/26/2014   Procedure: BOTOX INJECTION;  Surgeon: Missy Sabins, MD;  Location: [place];  Service: Endoscopy;  Laterality: N/A;  . BOTOX INJECTION N/A 07/08/2015   Procedure: BOTOX INJECTION;  Surgeon: Teena Irani, MD;  Location: [facility]  ENDOSCOPY;  Service: Endoscopy;  Laterality: N/A;  . BOTOX INJECTION N/A 04/27/2016   Procedure: BOTOX INJECTION;  Surgeon: Teena Irani, MD;  Location: [facility];  Service: Endoscopy;  Laterality: N/A;  . BOTOX INJECTION N/A 04/09/2017   Procedure: BOTOX INJECTION;  Surgeon: Clarene Essex, MD;  Location: [place];  Service: Endoscopy;  Laterality: N/A;  . ESOPHAGOGASTRODUODENOSCOPY  12/21/2011   Procedure: ESOPHAGOGASTRODUODENOSCOPY (EGD);  Surgeon: Missy Sabins, MD;  Location: [facility] ENDOSCOPY;  Service: Endoscopy;  Laterality: N/A;  . ESOPHAGOGASTRODUODENOSCOPY N/A 11/06/2012   Procedure: ESOPHAGOGASTRODUODENOSCOPY (EGD);  Surgeon: Missy Sabins, MD;  Location: [facility];  Service: Endoscopy;  Laterality: N/A;  . ESOPHAGOGASTRODUODENOSCOPY N/A 12/04/2013   Procedure: ESOPHAGOGASTRODUODENOSCOPY (EGD);  Surgeon: Missy Sabins, MD;  Location: [facility] ENDOSCOPY;  Service: Endoscopy;  Laterality: N/A;  . ESOPHAGOGASTRODUODENOSCOPY N/A 02/26/2014   Procedure: ESOPHAGOGASTRODUODENOSCOPY (EGD);  Surgeon: Missy Sabins, MD;  Location: [facility] ENDOSCOPY;  Service: Endoscopy;  Laterality: N/A;  . ESOPHAGOGASTRODUODENOSCOPY N/A 04/09/2017   Procedure: ESOPHAGOGASTRODUODENOSCOPY (EGD);  Surgeon: Clarene Essex, MD;  Location: [place];  Service: Endoscopy;  Laterality: N/A;  . ESOPHAGOGASTRODUODENOSCOPY (EGD) WITH PROPOFOL N/A 07/08/2015   Procedure: ESOPHAGOGASTRODUODENOSCOPY (EGD) WITH PROPOFOL;  Surgeon: Teena Irani, MD;  [facility]: [facility];  Service: Endoscopy;  Laterality: N/A;  . ESOPHAGOGASTRODUODENOSCOPY (EGD) WITH PROPOFOL N/A 04/27/2016   Procedure: ESOPHAGOGASTRODUODENOSCOPY (EGD) WITH PROPOFOL;  Surgeon: Teena Irani, MD;  Location: [facility];  Service: Endoscopy;  Laterality: N/A;  . FLEXIBLE SIGMOIDOSCOPY  05/01/2012   Procedure: FLEXIBLE SIGMOIDOSCOPY;  Surgeon: Jeryl Columbia, MD;  Location: [facility];  Service: Endoscopy;  Laterality: N/A;  unprepped  . FLEXIBLE SIGMOIDOSCOPY Left 09/28/2016    Procedure: FLEXIBLE SIGMOIDOSCOPY;  Surgeon: Wilford Corner, MD;  Location: [facility] END[facility];  Service: Endoscopy;  Laterality: Left;  . LAPAROSCOPIC CHOLECYSTECTOMY  01/2002  . NASAL SEPTOPLASTY W/ TURBINOPLASTY  1998  . SHOULDER ARTHROSCOPY WITH ROTATOR CUFF REPAIR AND OPEN BICEPS  TENODESIS Bilateral 2010-2013   left-right   Family History  Problem Relation Age of Onset  . Heart disease Mother   . COPD Mother   . Arthritis Mother   . Cancer Mother   . Hypertension Mother   . Arthritis Paternal Grandmother   . Heart disease Paternal Grandmother   . Heart disease Paternal Grandfather    Social History   Socioeconomic History  . Marital status: Single    Spouse name: None  . Number of children: 0  . Years of education: College  . Highest education level: None  Social Needs  . Financial resource strain: None  . Food insecurity - worry: None  . Food insecurity - inability: None  . Transportation needs - medical: None  . Transportation needs - non-medical: None  Occupational History  . Occupation: disabled  Tobacco Use  . Smoking status: Never Smoker  . Smokeless tobacco: Never Used  Substance and Sexual Activity  . Alcohol use: No  . Drug use: No  . Sexual activity: Not Currently  Other Topics Concern  . None  Social History Narrative   Caffeine 2 cups iced tea daily avg.   Depression screen [facility] 2/9 08/09/2017 04/23/2017 02/19/2017 01/29/2017 11/25/2016  Decreased Interest 0 0 0 0 1  Down, Depressed, Hopeless 0 0 0 0 1  PHQ - 2 Score 0 0 0 0 2  Altered sleeping - - - - 0  Tired, decreased energy - - - - 0  Change in appetite - - - - 0  Feeling bad or failure about yourself  - - - - 0  Trouble concentrating - - - - 0  Moving slowly or fidgety/restless - - - - 0  Suicidal thoughts - - - - 0  PHQ-9 Score - - - - 2  Difficult doing work/chores - - - - Somewhat difficult  Some recent data might be hidden    Review of Systems  Gastrointestinal: Positive for nausea.    Genitourinary: Positive for flank pain.  Neurological: Positive for headaches.  Psychiatric/Behavioral: Positive for sleep disturbance.      Objective:   Physical Exam  Constitutional: She is oriented to person, place, and time. She appears well-developed and well-nourished. No distress.  HENT:  Head: Normocephalic and atraumatic.  Eyes: Conjunctivae and EOM are normal.  Neck: Neck supple. No tracheal deviation present.  Cardiovascular: Normal rate.  Pulmonary/Chest: Effort normal. No respiratory distress.  Musculoskeletal: Normal range of motion.  Neurological: She is alert and oriented to person, place, and time.  Skin: Skin is warm and dry.  Psychiatric: She has a normal mood and affect. Her behavior is normal.  Nursing note and vitals reviewed.  BP 136/84 (BP Location: Left Arm, Patient Position: Sitting, Cuff Size: Normal)   Pulse 87   Temp 98.6 F (37 C) (Oral)   Resp 18   Ht 5\' 5"  (1.651 m)   Wt 297 lb 12.8 oz (135.1 kg)   SpO2 95%   BMI 49.56 kg/m     Results for orders placed or performed in visit on 08/09/17  POCT urinalysis dipstick  Result Value Ref Range   Color, UA yellow yellow   Clarity, UA clear clear   Glucose, UA negative negative mg/dL   Bilirubin, UA negative negative   Ketones, POC UA negative negative mg/dL   Spec Grav, UA <=1.005 (A) 1.010 - 1.025   Blood, UA negative negative   pH, UA 6.0 5.0 - 8.0   Protein Ur, POC  negative negative mg/dL   Urobilinogen, UA 0.2 0.2 or 1.0 E.U./dL   Nitrite, UA Negative Negative   Leukocytes, UA Negative Negative  POCT Microscopic Urinalysis ([facility])  Result Value Ref Range   WBC,UR,HPF,POC None None WBC/hpf   RBC,UR,HPF,POC None None RBC/hpf   Bacteria None None, Too numerous to count   Mucus Absent Absent   Epithelial Cells, UR Per Microscopy Few (A) None, Too numerous to count cells/hpf  POCT CBC  Result Value Ref Range   WBC 7.2 4.6 - 10.2 K/uL   Lymph, poc 2.5 0.6 - 3.4   POC LYMPH PERCENT 34.5 10  - 50 %L   MID (cbc) 0.2 0 - 0.9   POC MID % 2.9 0 - 12 %M   POC Granulocyte 4.5 2 - 6.9   Granulocyte percent 62.6 37 - 80 %G   RBC 4.55 4.04 - 5.48 M/uL   Hemoglobin 13.5 12.2 - 16.2 g/dL   HCT, POC 41.0 37.7 - 47.9 %   MCV 90.1 80 - 97 fL   MCH, POC 29.7 27 - 31.2 pg   MCHC 33.0 31.8 - 35.4 g/dL   RDW, POC 13.3 %   Platelet Count, POC 287 142 - 424 K/uL   MPV 7.9 0 - 99.8 fL   Assessment & Plan:   1. CKD (chronic kidney disease) stage 3, GFR 30-59 ml/min (HCC)   2. Medication monitoring encounter   3. Right flank discomfort - poss gas or from chronic back pain - no sign of kidney problems - pt reassured  4.      Generalized edema - doing MUCH better on current regimen of spironolactone 100mg  qd but had to decrease lasix 20 to qod because has flank pain when taking daily so will need to monitor bmp regularly for hyperkalemia (which is why she was taken off this regimen a year ago by nephrology). However, occ has some edema build up in her hands that really bothers her when taking lasix 20 qod and remembers responding very well, quickly, effectively to demadex prior - would like to retry demadex instead of lasix for diuresis.  Recheck w/ repeat bmp in 1-2 mos. 5.      Depression - has appt w/ [facility] psychiatry in April - very resistant to changing any of her med regimen but realizes that her psychiatric state is holding her back so has taken my advise and decided she is willing to just go talk to a psychiatrist to see if they have any medication recommendations for her which she would be amenable to - does not have to take their advice but just at least needs to ask what it is - don't know if she could be on a better med regimen if she never asks.  Orders Placed This Encounter  Procedures  . Basic metabolic panel    Order Specific Question:   Has the patient fasted?    Answer:   No  . POCT urinalysis dipstick  . POCT Microscopic Urinalysis ([facility])  . POCT CBC    Meds ordered this  encounter  Medications  . torsemide (DEMADEX) 10 MG tablet    Sig: Take 1 tablet (10 mg total) by mouth daily.    Dispense:  30 tablet    Refill:  1    D/c furosemide    I personally performed the services described in this documentation, which was scribed in my presence. The recorded information has been reviewed and considered, and addended by me as needed.  Delman Cheadle, M.D.  Primary Care at [facility] [address] [place], [place] [address] [phone] phone [phone] fax  08/15/17 4:09 AM

## 2017-08-14 ENCOUNTER — Other Ambulatory Visit: Payer: Self-pay | Admitting: Family Medicine

## 2017-08-14 DIAGNOSIS — E790 Hyperuricemia without signs of inflammatory arthritis and tophaceous disease: Secondary | ICD-10-CM

## 2017-08-20 DIAGNOSIS — M797 Fibromyalgia: Secondary | ICD-10-CM | POA: Diagnosis not present

## 2017-08-20 DIAGNOSIS — Z6841 Body Mass Index (BMI) 40.0 and over, adult: Secondary | ICD-10-CM | POA: Diagnosis not present

## 2017-08-20 DIAGNOSIS — M2550 Pain in unspecified joint: Secondary | ICD-10-CM | POA: Diagnosis not present

## 2017-08-22 DIAGNOSIS — M542 Cervicalgia: Secondary | ICD-10-CM | POA: Diagnosis not present

## 2017-08-28 DIAGNOSIS — M542 Cervicalgia: Secondary | ICD-10-CM | POA: Diagnosis not present

## 2017-09-04 DIAGNOSIS — M542 Cervicalgia: Secondary | ICD-10-CM | POA: Diagnosis not present

## 2017-09-13 DIAGNOSIS — M542 Cervicalgia: Secondary | ICD-10-CM | POA: Diagnosis not present

## 2017-09-18 DIAGNOSIS — M461 Sacroiliitis, not elsewhere classified: Secondary | ICD-10-CM | POA: Diagnosis not present

## 2017-09-18 DIAGNOSIS — F332 Major depressive disorder, recurrent severe without psychotic features: Secondary | ICD-10-CM | POA: Diagnosis not present

## 2017-09-18 DIAGNOSIS — Z79891 Long term (current) use of opiate analgesic: Secondary | ICD-10-CM | POA: Diagnosis not present

## 2017-09-18 DIAGNOSIS — M79661 Pain in right lower leg: Secondary | ICD-10-CM | POA: Diagnosis not present

## 2017-09-18 DIAGNOSIS — E669 Obesity, unspecified: Secondary | ICD-10-CM | POA: Diagnosis not present

## 2017-09-18 DIAGNOSIS — M79662 Pain in left lower leg: Secondary | ICD-10-CM | POA: Diagnosis not present

## 2017-09-18 DIAGNOSIS — K219 Gastro-esophageal reflux disease without esophagitis: Secondary | ICD-10-CM | POA: Diagnosis not present

## 2017-09-18 DIAGNOSIS — Z87441 Personal history of nephrotic syndrome: Secondary | ICD-10-CM | POA: Diagnosis not present

## 2017-09-18 DIAGNOSIS — K30 Functional dyspepsia: Secondary | ICD-10-CM | POA: Diagnosis not present

## 2017-09-18 DIAGNOSIS — M79601 Pain in right arm: Secondary | ICD-10-CM | POA: Diagnosis not present

## 2017-09-18 DIAGNOSIS — K559 Vascular disorder of intestine, unspecified: Secondary | ICD-10-CM | POA: Diagnosis not present

## 2017-09-18 DIAGNOSIS — F4312 Post-traumatic stress disorder, chronic: Secondary | ICD-10-CM | POA: Diagnosis not present

## 2017-09-18 DIAGNOSIS — Z9989 Dependence on other enabling machines and devices: Secondary | ICD-10-CM | POA: Diagnosis not present

## 2017-09-18 DIAGNOSIS — Z8639 Personal history of other endocrine, nutritional and metabolic disease: Secondary | ICD-10-CM | POA: Diagnosis not present

## 2017-09-18 DIAGNOSIS — F329 Major depressive disorder, single episode, unspecified: Secondary | ICD-10-CM | POA: Diagnosis not present

## 2017-09-18 DIAGNOSIS — Z9889 Other specified postprocedural states: Secondary | ICD-10-CM | POA: Diagnosis not present

## 2017-09-18 DIAGNOSIS — M545 Low back pain: Secondary | ICD-10-CM | POA: Diagnosis not present

## 2017-09-18 DIAGNOSIS — Z8739 Personal history of other diseases of the musculoskeletal system and connective tissue: Secondary | ICD-10-CM | POA: Diagnosis not present

## 2017-09-18 DIAGNOSIS — Z8709 Personal history of other diseases of the respiratory system: Secondary | ICD-10-CM | POA: Diagnosis not present

## 2017-09-18 DIAGNOSIS — M300 Polyarteritis nodosa: Secondary | ICD-10-CM | POA: Diagnosis not present

## 2017-09-18 DIAGNOSIS — G4733 Obstructive sleep apnea (adult) (pediatric): Secondary | ICD-10-CM | POA: Diagnosis not present

## 2017-09-19 ENCOUNTER — Encounter: Payer: Self-pay | Admitting: Physician Assistant

## 2017-09-20 ENCOUNTER — Other Ambulatory Visit: Payer: Self-pay

## 2017-09-20 ENCOUNTER — Encounter: Payer: Self-pay | Admitting: Family Medicine

## 2017-09-20 ENCOUNTER — Ambulatory Visit (INDEPENDENT_AMBULATORY_CARE_PROVIDER_SITE_OTHER): Payer: Medicare Other | Admitting: Family Medicine

## 2017-09-20 VITALS — BP 118/73 | HR 84 | Temp 98.6°F | Resp 16 | Ht 65.0 in | Wt 297.6 lb

## 2017-09-20 DIAGNOSIS — N183 Chronic kidney disease, stage 3 unspecified: Secondary | ICD-10-CM

## 2017-09-20 DIAGNOSIS — F411 Generalized anxiety disorder: Secondary | ICD-10-CM | POA: Diagnosis not present

## 2017-09-20 DIAGNOSIS — F332 Major depressive disorder, recurrent severe without psychotic features: Secondary | ICD-10-CM | POA: Diagnosis not present

## 2017-09-20 DIAGNOSIS — R1084 Generalized abdominal pain: Secondary | ICD-10-CM | POA: Diagnosis not present

## 2017-09-20 DIAGNOSIS — K219 Gastro-esophageal reflux disease without esophagitis: Secondary | ICD-10-CM | POA: Diagnosis not present

## 2017-09-20 DIAGNOSIS — J9809 Other diseases of bronchus, not elsewhere classified: Secondary | ICD-10-CM

## 2017-09-20 DIAGNOSIS — G8929 Other chronic pain: Secondary | ICD-10-CM | POA: Insufficient documentation

## 2017-09-20 DIAGNOSIS — M2560 Stiffness of unspecified joint, not elsewhere classified: Secondary | ICD-10-CM

## 2017-09-20 DIAGNOSIS — K3184 Gastroparesis: Secondary | ICD-10-CM

## 2017-09-20 DIAGNOSIS — R110 Nausea: Secondary | ICD-10-CM | POA: Diagnosis not present

## 2017-09-20 DIAGNOSIS — K859 Acute pancreatitis without necrosis or infection, unspecified: Secondary | ICD-10-CM | POA: Diagnosis not present

## 2017-09-20 DIAGNOSIS — R601 Generalized edema: Secondary | ICD-10-CM | POA: Insufficient documentation

## 2017-09-20 DIAGNOSIS — F4321 Adjustment disorder with depressed mood: Secondary | ICD-10-CM

## 2017-09-20 DIAGNOSIS — Z5181 Encounter for therapeutic drug level monitoring: Secondary | ICD-10-CM

## 2017-09-20 DIAGNOSIS — J301 Allergic rhinitis due to pollen: Secondary | ICD-10-CM | POA: Diagnosis not present

## 2017-09-20 DIAGNOSIS — R1013 Epigastric pain: Secondary | ICD-10-CM

## 2017-09-20 LAB — POCT URINALYSIS DIP (MANUAL ENTRY)
BILIRUBIN UA: NEGATIVE mg/dL
Bilirubin, UA: NEGATIVE
Glucose, UA: NEGATIVE mg/dL
Leukocytes, UA: NEGATIVE
Nitrite, UA: NEGATIVE
PROTEIN UA: NEGATIVE mg/dL
RBC UA: NEGATIVE
UROBILINOGEN UA: 0.2 U/dL
pH, UA: 5.5 (ref 5.0–8.0)

## 2017-09-20 LAB — POC MICROSCOPIC URINALYSIS (UMFC): Mucus: ABSENT

## 2017-09-20 MED ORDER — METHYLPREDNISOLONE ACETATE 80 MG/ML IJ SUSP
120.0000 mg | Freq: Once | INTRAMUSCULAR | Status: AC
  Administered 2017-09-20: 120 mg via INTRAMUSCULAR

## 2017-09-20 MED ORDER — ALPRAZOLAM 1 MG PO TABS: 1.0000 mg | ORAL_TABLET | Freq: Three times a day (TID) | ORAL | 1 refills | Status: DC | PRN

## 2017-09-20 MED ORDER — HYDROXYZINE HCL 10 MG PO TABS: 10.0000 mg | ORAL_TABLET | Freq: Three times a day (TID) | ORAL | 1 refills | Status: DC | PRN

## 2017-09-20 MED ORDER — SUCRALFATE 1 GM/10ML PO SUSP: 1.0000 g | Freq: Four times a day (QID) | ORAL | 1 refills | Status: DC | PRN

## 2017-09-20 NOTE — Patient Instructions (Addendum)
   IF you received an x-ray today, you will receive an invoice from [facility]. Please contact [facility] at [phone] with questions or concerns regarding your invoice.   IF you received labwork today, you will receive an invoice from [facility]. Please contact [facility] at [phone] with questions or concerns regarding your invoice.   Our billing staff will not be able to assist you with questions regarding bills from these companies.  You will be contacted with the lab results as soon as they are available. The fastest way to get your results is to activate your My Chart account. Instructions are located on the last page of this paperwork. If you have not heard from us regarding the results in 2 weeks, please contact this office.    Allergic Rhinitis, Adult Allergic rhinitis is an allergic reaction that affects the mucous membrane inside the nose. It causes sneezing, a runny or stuffy nose, and the feeling of mucus going down the back of the throat (postnasal drip). Allergic rhinitis can be mild to severe. There are two types of allergic rhinitis:  Seasonal. This type is also called hay fever. It happens only during certain seasons.  Perennial. This type can happen at any time of the year.  What are the causes? This condition happens when the body's defense system (immune system) responds to certain harmless substances called allergens as though they were germs.  Seasonal allergic rhinitis is triggered by pollen, which can come from grasses, trees, and weeds. Perennial allergic rhinitis may be caused by:  House dust mites.  Pet dander.  Mold spores.  What are the signs or symptoms? Symptoms of this condition include:  Sneezing.  Runny or stuffy nose (nasal congestion).  Postnasal drip.  Itchy nose.  Tearing of the eyes.  Trouble sleeping.  Daytime sleepiness.  How is this diagnosed? This condition may be diagnosed based on:  Your medical  history.  A physical exam.  Tests to check for related conditions, such as: ? Asthma. ? Pink eye. ? Ear infection. ? Upper respiratory infection.  Tests to find out which allergens trigger your symptoms. These may include skin or blood tests.  How is this treated? There is no cure for this condition, but treatment can help control symptoms. Treatment may include:  Taking medicines that block allergy symptoms, such as antihistamines. Medicine may be given as a shot, nasal spray, or pill.  Avoiding the allergen.  Desensitization. This treatment involves getting ongoing shots until your body becomes less sensitive to the allergen. This treatment may be done if other treatments do not help.  If taking medicine and avoiding the allergen does not work, new, stronger medicines may be prescribed.  Follow these instructions at home:  Find out what you are allergic to. Common allergens include smoke, dust, and pollen.  Avoid the things you are allergic to. These are some things you can do to help avoid allergens: ? Replace carpet with wood, tile, or vinyl flooring. Carpet can trap dander and dust. ? Do not smoke. Do not allow smoking in your home. ? Change your heating and air conditioning filter at least once a month. ? During allergy season:  Keep windows closed as much as possible.  Plan outdoor activities when pollen counts are lowest. This is usually during the evening hours.  When coming indoors, change clothing and shower before sitting on furniture or bedding.  Take over-the-counter and prescription medicines only as told by your health care provider.  Keep all   follow-up visits as told by your health care provider. This is important. Contact a health care provider if:  You have a fever.  You develop a persistent cough.  You make whistling sounds when you breathe (you wheeze).  Your symptoms interfere with your normal daily activities. Get help right away if:  You  have shortness of breath. Summary  This condition can be managed by taking medicines as directed and avoiding allergens.  Contact your health care provider if you develop a persistent cough or fever.  During allergy season, keep windows closed as much as possible. This information is not intended to replace advice given to you by your health care provider. Make sure you discuss any questions you have with your health care provider. Document Released: 02/21/2001 Document Revised: 07/06/2016 Document Reviewed: 07/06/2016 Elsevier Interactive Patient Education  2018 [employer]  

## 2017-09-20 NOTE — Progress Notes (Signed)
Subjective:  By signing my name below, I, Essence Howell, attest that this documentation has been prepared under the direction and in the presence of Delman Cheadle, MD Electronically Signed: Ladene Artist, ED Scribe 09/20/2017 at 8:31 AM.   Patient ID: Valerie [name], female    DOB: 1961/06/09, 57 y.o.   MRN: [id number] Chief Complaint  Patient presents with  . Edema    follow up   . Hypertension    follow up    HPI Valerie [name] is a 57 y.o. female who presents to Primary Care at [facility] for f/u. Pt started taking torsemide daily with initial improvement of arthritic pain and flank pain but then noticed that she was sleeping 10+ hours and fatigued after 2-3 hours of being awake, so she began alternating 10 and 5 mg over the past 2.5 wks. She does report nausea and stomach burning as well and requests a refill of Carafate. Denies recent esophageal burning, change in food, radiating abdominal pain into back, loose/fatty/floating stools. Pt has not needed Pancrelipase lately. Has been followed by Dr. Aurelio Brash since her previous GI doctor is no longer in practice.  She has been using eye drops, Flonase, Zyrtec, Singulair and Atrovent for allergies. Has also been using albuterol inhaler more frequently due to wheezing.  States she has been taking hydroxyzine (1-2 during the day) and xanax more than usual. Pt has an upcoming appointment with [facility] on 4/15.  Past Medical History:  Diagnosis Date  . Acute ischemic colitis ([place])   . Anemia 05/01/2012  . Anxiety   . ARF (acute renal failure) ([place]) 12/28/2015  . Arthritis    "basically ~ q joint" (12/28/2015)  . Asthma   . Basal cell carcinoma of upper back ~ 2011  . Chronic right-sided headaches    and neck pain, relieved wiht PT and chronic practic manipulation  . DDD (degenerative disc disease), lumbar   . Diarrhea    EPISODIC  . Fatigue    h/o excessive sedation, etiology unknown  . Fibromyalgia   . First degree heart  block   . GERD (gastroesophageal reflux disease) 04/06/2017  . Headache(784.0)    "muscular w/migraine intensity; ~ 5-6 months" (12/28/2015)  . History of ischemic colitis 09/24/2016  . Hypertension   . Mental disorder    reports PTSD  . Microscopic polyangiitis ([place])   . Nerve pain 2011   nerve damage to right arm; normal EMG/NCV to RUExt after shoulder surgery 12/2009  . Neuromuscular disorder ([place])   . Pancreatitis    due to Effexor  . Pneumonia ~ 2014; 08/2015   Aspiration  . Polymyalgia rheumatica ([place]) onset 08/2015   changed diagnosis to MPA  . Precancerous melanosis ([place])    "2nd toe left foot; 5th toe of right foot; right temple; had them all cut off"  . Sleep apnea    CPAP-9   Past Surgical History:  Procedure Laterality Date  . BASAL CELL CARCINOMA EXCISION  ~ 2011   back  . BOTOX INJECTION N/A 11/06/2012   Procedure: BOTOX INJECTION;  Surgeon: Missy Sabins, MD;  Location: [facility];  Service: Endoscopy;  Laterality: N/A;  . BOTOX INJECTION N/A 12/04/2013   Procedure: BOTOX INJECTION;  Surgeon: Missy Sabins, MD;  Location: [place];  Service: Endoscopy;  Laterality: N/A;  . BOTOX INJECTION N/A 02/26/2014   Procedure: BOTOX INJECTION;  Surgeon: Missy Sabins, MD;  Location: [place];  Service: Endoscopy;  Laterality: N/A;  . BOTOX  INJECTION N/A 07/08/2015   Procedure: BOTOX INJECTION;  Surgeon: Teena Irani, MD;  [facility]: [facility];  Service: Endoscopy;  Laterality: N/A;  . BOTOX INJECTION N/A 04/27/2016   Procedure: BOTOX INJECTION;  Surgeon: Teena Irani, MD;  [facility];  Service: Endoscopy;  Laterality: N/A;  . BOTOX INJECTION N/A 04/09/2017   Procedure: BOTOX INJECTION;  Surgeon: Clarene Essex, MD;  Location: [place];  Service: Endoscopy;  Laterality: N/A;  . ESOPHAGOGASTRODUODENOSCOPY  12/21/2011   Procedure: ESOPHAGOGASTRODUODENOSCOPY (EGD);  Surgeon: Missy Sabins, MD;  Location: [facility] ENDOSCOPY;  Service: Endoscopy;  Laterality: N/A;  .  ESOPHAGOGASTRODUODENOSCOPY N/A 11/06/2012   Procedure: ESOPHAGOGASTRODUODENOSCOPY (EGD);  Surgeon: Missy Sabins, MD;  Location: [facility];  Service: Endoscopy;  Laterality: N/A;  . ESOPHAGOGASTRODUODENOSCOPY N/A 12/04/2013   Procedure: ESOPHAGOGASTRODUODENOSCOPY (EGD);  Surgeon: Missy Sabins, MD;  Location: [facility] ENDOSCOPY;  Service: Endoscopy;  Laterality: N/A;  . ESOPHAGOGASTRODUODENOSCOPY N/A 02/26/2014   Procedure: ESOPHAGOGASTRODUODENOSCOPY (EGD);  Surgeon: Missy Sabins, MD;  Location: [facility] ENDOSCOPY;  Service: Endoscopy;  Laterality: N/A;  . ESOPHAGOGASTRODUODENOSCOPY N/A 04/09/2017   Procedure: ESOPHAGOGASTRODUODENOSCOPY (EGD);  Surgeon: Clarene Essex, MD;  Location: [place];  Service: Endoscopy;  Laterality: N/A;  . ESOPHAGOGASTRODUODENOSCOPY (EGD) WITH PROPOFOL N/A 07/08/2015   Procedure: ESOPHAGOGASTRODUODENOSCOPY (EGD) WITH PROPOFOL;  Surgeon: Teena Irani, MD;  [facility];  Service: Endoscopy;  Laterality: N/A;  . ESOPHAGOGASTRODUODENOSCOPY (EGD) WITH PROPOFOL N/A 04/27/2016   Procedure: ESOPHAGOGASTRODUODENOSCOPY (EGD) WITH PROPOFOL;  Surgeon: Teena Irani, MD;  [facility];  Service: Endoscopy;  Laterality: N/A;  . FLEXIBLE SIGMOIDOSCOPY  05/01/2012   Procedure: FLEXIBLE SIGMOIDOSCOPY;  Surgeon: Jeryl Columbia, MD;  [facility];  Service: Endoscopy;  Laterality: N/A;  unprepped  . FLEXIBLE SIGMOIDOSCOPY Left 09/28/2016   Procedure: FLEXIBLE SIGMOIDOSCOPY;  Surgeon: Wilford Corner, MD;  [facility];  Service: Endoscopy;  Laterality: Left;  . LAPAROSCOPIC CHOLECYSTECTOMY  01/2002  . NASAL SEPTOPLASTY W/ TURBINOPLASTY  1998  . SHOULDER ARTHROSCOPY WITH ROTATOR CUFF REPAIR AND OPEN BICEPS TENODESIS Bilateral 2010-2013   left-right   Current Outpatient Medications on File Prior to Visit  Medication Sig Dispense Refill  . albuterol (PROAIR HFA) 108 (90 Base) MCG/ACT inhaler Inhale 2 puffs every 4 (four) hours as needed into the lungs for wheezing or  shortness of breath. 8.5 each 11  . allopurinol (ZYLOPRIM) 300 MG tablet TAKE 1/2 TABLET BY MOUTH ONCE A DAY 45 tablet 0  . baclofen (LIORESAL) 10 MG tablet Take 10 mg by mouth 3 (three) times daily. Takes as needed    . benazepril (LOTENSIN) 20 MG tablet Take 1 tablet (20 mg total) daily by mouth. 90 tablet 3  . calcium carbonate (OSCAL) 1500 (600 Ca) MG TABS tablet Take 600 mg of elemental calcium by mouth daily with breakfast.    . cetirizine (ZYRTEC) 10 MG tablet Take 10 mg by mouth daily. Alternates between Allegra and Zyrtec as needed for allergies    . Cholecalciferol (VITAMIN D3) 5000 units CAPS Take 1 capsule by mouth.    . dexlansoprazole (DEXILANT) 60 MG capsule Take 60 mg by mouth 2 (two) times daily.    . fexofenadine (ALLEGRA) 180 MG tablet Take 180 mg by mouth daily as needed. Switches between Allegra and Zyrtec as needed for allergies    . fluticasone (FLONASE) 50 MCG/ACT nasal spray Place 2 sprays into both nostrils daily. 48 g 2  . HYDROcodone-acetaminophen (NORCO) 10-325 MG per tablet Take 1-2 tablets by mouth every 6 (six) hours as needed  for moderate pain (pain.).     Marland Kitchen ipratropium (ATROVENT) 0.03 % nasal spray Place 2 sprays 4 (four) times daily into the nose. 30 mL 1  . metoprolol succinate (TOPROL-XL) 100 MG 24 hr tablet Take 1 tablet (100 mg total) by mouth daily. Take with or immediately following a meal. 90 tablet 3  . modafinil (PROVIGIL) 200 MG tablet Take 0.5 tablets (100 mg total) by mouth 2 (two) times daily. 90 tablet 1  . montelukast (SINGULAIR) 10 MG tablet TAKE ONE TABLET BY MOUTH EVERY NIGHT AT BEDTIME 90 tablet 2  . morphine (MS CONTIN) 30 MG 12 hr tablet Take 30 mg by mouth every 8 (eight) hours. Not from [place]    . Multiple Vitamins-Minerals (MULTIVITAMIN PO) Take 1 tablet by mouth daily.    . Olopatadine HCl (PAZEO) 0.7 % SOLN Apply 1 drop to eye daily. 2.5 mL 11  . ondansetron (ZOFRAN) 4 MG tablet Take 1 tablet (4 mg total) by mouth every 8 (eight) hours as  needed for nausea or vomiting. 20 tablet 0  . Pancrelipase, Lip-Prot-Amyl, (CREON) 24000 UNITS CPEP Take 72,000 Units by mouth 3 (three) times daily as needed (for pancreatitis).     . promethazine (PHENERGAN) 25 MG tablet Take 1 tablet (25 mg total) by mouth every 6 (six) hours as needed for nausea or vomiting. 30 tablet 1  . spironolactone (ALDACTONE) 100 MG tablet Take 1 tablet (100 mg total) by mouth daily. 90 tablet 0  . torsemide (DEMADEX) 10 MG tablet Take 1 tablet (10 mg total) by mouth daily. 30 tablet 1   No current facility-administered medications on file prior to visit.    Allergies  Allergen Reactions  . Dilaudid [Hydromorphone Hcl] Other (See Comments)    Headache   . Effexor [Venlafaxine]     pancreatitis  . Pregabalin Other (See Comments)    Drug induced stupor  . Metoclopramide Hcl Other (See Comments)    "crawling inside"  . Neurontin [Gabapentin] Other (See Comments)    Tremors   . Other Other (See Comments)    Avoid all SSRI's and SNRI's- damaged pancreas  . Serotonin Reuptake Inhibitors (Ssris) Other (See Comments)    Damage to pancreas and bowel/urinary incontinence    Family History  Problem Relation Age of Onset  . Heart disease Mother   . COPD Mother   . Arthritis Mother   . Cancer Mother   . Hypertension Mother   . Arthritis Paternal Grandmother   . Heart disease Paternal Grandmother   . Heart disease Paternal Grandfather    Social History   Socioeconomic History  . Marital status: Single    Spouse name: Not on file  . Number of children: 0  . Years of education: College  . Highest education level: Not on file  Occupational History  . Occupation: disabled  Social Needs  . Financial resource strain: Not on file  . Food insecurity:    Worry: Not on file    Inability: Not on file  . Transportation needs:    Medical: Not on file    Non-medical: Not on file  Tobacco Use  . Smoking status: Never Smoker  . Smokeless tobacco: Never Used    Substance and Sexual Activity  . Alcohol use: No  . Drug use: No  . Sexual activity: Not Currently  Lifestyle  . Physical activity:    Days per week: Not on file    Minutes per session: Not on file  . Stress: Not  on file  Relationships  . Social connections:    Talks on phone: Not on file    Gets together: Not on file    Attends religious service: Not on file    Active member of club or organization: Not on file    Attends meetings of clubs or organizations: Not on file    Relationship status: Not on file  Other Topics Concern  . Not on file  Social History Narrative   Caffeine 2 cups iced tea daily avg.   Depression screen [facility] 2/9 09/20/2017 08/09/2017 04/23/2017 02/19/2017 01/29/2017  Decreased Interest 0 0 0 0 0  Down, Depressed, Hopeless 0 0 0 0 0  PHQ - 2 Score 0 0 0 0 0  Altered sleeping - - - - -  Tired, decreased energy - - - - -  Change in appetite - - - - -  Feeling bad or failure about yourself  - - - - -  Trouble concentrating - - - - -  Moving slowly or fidgety/restless - - - - -  Suicidal thoughts - - - - -  PHQ-9 Score - - - - -  Difficult doing work/chores - - - - -  Some recent data might be hidden      Review of Systems  Constitutional: Positive for fatigue.  Respiratory: Positive for wheezing.   Gastrointestinal: Positive for abdominal pain and nausea. Negative for diarrhea.  Allergic/Immunologic: Positive for environmental allergies.  Psychiatric/Behavioral: The patient is nervous/anxious.       Objective:   Physical Exam  Constitutional: She is oriented to person, place, and time. She appears well-developed and well-nourished. No distress.  HENT:  Head: Normocephalic and atraumatic.  Nose: Rhinorrhea present.  Mouth/Throat: thick white postnasal drip.  Eyes: Conjunctivae and EOM are normal.  Neck: Neck supple. No tracheal deviation present.  Cardiovascular: Normal rate.  Pulmonary/Chest: Effort normal. No respiratory distress.  Abdominal:  Bowel sounds are normal. There is generalized tenderness.  Subjectively worse in LLQ  Musculoskeletal: Normal range of motion.  Trace petal edema  Neurological: She is alert and oriented to person, place, and time.  Skin: Skin is warm and dry.  Psychiatric: She has a normal mood and affect. Her behavior is normal.  Nursing note and vitals reviewed.     Assessment & Plan:   1. Nausea without vomiting   2. Generalized edema   3. Medication monitoring encounter   4. CKD (chronic kidney disease) stage 3, GFR 30-59 ml/min (HCC)   5. Gastroesophageal reflux disease, esophagitis presence not specified   6. Gastroparesis   7. Recurrent pancreatitis   8. Abdominal pain, chronic, generalized   9. Severe episode of recurrent major depressive disorder, without psychotic features ([place])   10. Anxiety state   11. Obesity, morbid ([place])   12. Seasonal allergic rhinitis due to pollen   13. Multiple stiff joints   14. Recurrent bronchospasm   15. Abdominal pain, epigastric   16. Grief     Orders Placed This Encounter  Procedures  . CBC with Differential/Platelet  . Comprehensive metabolic panel  . Lipase  . Sedimentation Rate  . POCT urinalysis dipstick  . POCT Microscopic Urinalysis ([facility])    Meds ordered this encounter  Medications  . methylPREDNISolone acetate (DEPO-MEDROL) injection 120 mg  . sucralfate (CARAFATE) 1 GM/10ML suspension    Sig: Take 10 mLs (1 g total) by mouth 4 (four) times daily as needed (stomach pain).    Dispense:  420 mL  Refill:  1  . hydrOXYzine (ATARAX/VISTARIL) 10 MG tablet    Sig: Take 1-3 tablets (10-30 mg total) by mouth 3 (three) times daily as needed.    Dispense:  90 tablet    Refill:  1  . ALPRAZolam (XANAX) 1 MG tablet    Sig: Take 1 tablet (1 mg total) by mouth 3 (three) times daily as needed for anxiety.    Dispense:  90 tablet    Refill:  1    I personally performed the services described in this documentation, which was scribed in my  presence. The recorded information has been reviewed and considered, and addended by me as needed.   Delman Cheadle, M.D.  Primary Care at [facility] [address] [place], [place] [address] [phone] phone [phone] fax  09/23/17 11:40 PM

## 2017-09-21 DIAGNOSIS — M542 Cervicalgia: Secondary | ICD-10-CM | POA: Diagnosis not present

## 2017-09-21 LAB — CBC WITH DIFFERENTIAL/PLATELET
BASOS: 0 %
Basophils Absolute: 0 10*3/uL (ref 0.0–0.2)
EOS (ABSOLUTE): 0.1 10*3/uL (ref 0.0–0.4)
Eos: 2 %
Hematocrit: 40.9 % (ref 34.0–46.6)
Hemoglobin: 13.2 g/dL (ref 11.1–15.9)
Immature Grans (Abs): 0 10*3/uL (ref 0.0–0.1)
Immature Granulocytes: 0 %
LYMPHS ABS: 2.6 10*3/uL (ref 0.7–3.1)
Lymphs: 34 %
MCH: 29.4 pg (ref 26.6–33.0)
MCHC: 32.3 g/dL (ref 31.5–35.7)
MCV: 91 fL (ref 79–97)
MONOS ABS: 0.5 10*3/uL (ref 0.1–0.9)
Monocytes: 7 %
NEUTROS ABS: 4.4 10*3/uL (ref 1.4–7.0)
NEUTROS PCT: 57 %
Platelets: 305 10*3/uL (ref 150–379)
RBC: 4.49 x10E6/uL (ref 3.77–5.28)
RDW: 13.7 % (ref 12.3–15.4)
WBC: 7.8 10*3/uL (ref 3.4–10.8)

## 2017-09-21 LAB — COMPREHENSIVE METABOLIC PANEL
A/G RATIO: 1.5 (ref 1.2–2.2)
ALT: 14 IU/L (ref 0–32)
AST: 15 IU/L (ref 0–40)
Albumin: 4.6 g/dL (ref 3.5–5.5)
Alkaline Phosphatase: 63 IU/L (ref 39–117)
BUN/Creatinine Ratio: 19 (ref 9–23)
BUN: 25 mg/dL — ABNORMAL HIGH (ref 6–24)
CALCIUM: 10.5 mg/dL — AB (ref 8.7–10.2)
CHLORIDE: 95 mmol/L — AB (ref 96–106)
CO2: 27 mmol/L (ref 20–29)
Creatinine, Ser: 1.29 mg/dL — ABNORMAL HIGH (ref 0.57–1.00)
GFR calc Af Amer: 54 mL/min/{1.73_m2} — ABNORMAL LOW (ref >59)
GFR, EST NON AFRICAN AMERICAN: 47 mL/min/{1.73_m2} — AB (ref >59)
GLOBULIN, TOTAL: 3 g/dL (ref 1.5–4.5)
Glucose: 128 mg/dL — ABNORMAL HIGH (ref 65–99)
POTASSIUM: 5 mmol/L (ref 3.5–5.2)
SODIUM: 136 mmol/L (ref 134–144)
Total Protein: 7.6 g/dL (ref 6.0–8.5)

## 2017-09-21 LAB — SEDIMENTATION RATE: SED RATE: 18 mm/h (ref 0–40)

## 2017-09-21 LAB — LIPASE: Lipase: 58 U/L (ref 14–72)

## 2017-09-24 ENCOUNTER — Telehealth: Payer: Self-pay | Admitting: Family Medicine

## 2017-09-24 DIAGNOSIS — F4310 Post-traumatic stress disorder, unspecified: Secondary | ICD-10-CM | POA: Diagnosis not present

## 2017-09-24 DIAGNOSIS — F331 Major depressive disorder, recurrent, moderate: Secondary | ICD-10-CM | POA: Diagnosis not present

## 2017-09-24 NOTE — Telephone Encounter (Signed)
Copied from [place] [id number]. Topic: General - Other >> Sep 24, 2017  2:16 PM Yvette Rack wrote: Reason for CRM: patient calling for lab results

## 2017-09-25 NOTE — Telephone Encounter (Signed)
Please on labs from 4/11

## 2017-09-26 NOTE — Telephone Encounter (Signed)
Please schedule visit with Carlis Abbott per Brigitte Pulse.

## 2017-09-26 NOTE — Telephone Encounter (Signed)
I'm so sorry that she got the impression that I didn't want to see her for 3 months. The follow-up time is the MAXIMUM amount of time to go before I feel it would be unsafe to see her later. She is more than welcome to come in ANYTIME she has concerns or even if she isn't and just feels it would be prudent to get checked out - I just know she has a lot of appointments and didn't want to add to her burden but w/ her complex med regimen we shouldn't ever go LONGER than 3 mos.   She is more than welcome to schedule a visit every month if she is able so that way we can stay really on top of things.  But with constant nausea I don't even want her to wait a month, she should make an appointment to see Philis Fendt asap (I will be out of the office until 4/29).  I will release her labs immediately and will write comments on them today.

## 2017-09-26 NOTE — Telephone Encounter (Signed)
Phone call from pt.  Requested lab results from 09/20/17.  Advised pt., the labs from 09/20/17 have not been released.  Pt. verb. disappointment.  Stated she received the Urinalysis results.    Pt. c/o nausea being "more constant", since Friday.  Reported having "dry heaves" today.  Stated she has had to take either Zofran or Phenergan since Friday, to be able to eat.  Reported concern that she is not supposed to f/u for 3 months.  Stated she questioned if she should be watched more closely.  Advised will send message to Dr. Brigitte Pulse.  Verb. Understanding; agreed.

## 2017-09-27 NOTE — Telephone Encounter (Signed)
Mychart message sent to pt about making an apt with Valerie [name]

## 2017-09-28 ENCOUNTER — Ambulatory Visit (INDEPENDENT_AMBULATORY_CARE_PROVIDER_SITE_OTHER): Payer: Medicare Other | Admitting: Physician Assistant

## 2017-09-28 ENCOUNTER — Other Ambulatory Visit: Payer: Self-pay

## 2017-09-28 ENCOUNTER — Encounter: Payer: Self-pay | Admitting: Physician Assistant

## 2017-09-28 VITALS — BP 126/80 | HR 89 | Temp 97.9°F | Resp 18 | Ht 65.0 in | Wt 292.8 lb

## 2017-09-28 DIAGNOSIS — D72820 Lymphocytosis (symptomatic): Secondary | ICD-10-CM

## 2017-09-28 DIAGNOSIS — R197 Diarrhea, unspecified: Secondary | ICD-10-CM

## 2017-09-28 DIAGNOSIS — R110 Nausea: Secondary | ICD-10-CM

## 2017-09-28 LAB — POCT URINALYSIS DIP (MANUAL ENTRY)
BILIRUBIN UA: NEGATIVE
BILIRUBIN UA: NEGATIVE mg/dL
Blood, UA: NEGATIVE
GLUCOSE UA: NEGATIVE mg/dL
LEUKOCYTES UA: NEGATIVE
NITRITE UA: NEGATIVE
Protein Ur, POC: NEGATIVE mg/dL
Spec Grav, UA: 1.015 (ref 1.010–1.025)
Urobilinogen, UA: 0.2 E.U./dL
pH, UA: 5.5 (ref 5.0–8.0)

## 2017-09-28 LAB — POCT CBC
GRANULOCYTE PERCENT: 65.5 % (ref 37–80)
HEMATOCRIT: 40.8 % (ref 37.7–47.9)
HEMOGLOBIN: 13.4 g/dL (ref 12.2–16.2)
Lymph, poc: 3.8 — AB (ref 0.6–3.4)
MCH: 29.6 pg (ref 27–31.2)
MCHC: 33 g/dL (ref 31.8–35.4)
MCV: 89.7 fL (ref 80–97)
MID (cbc): 0.2 (ref 0–0.9)
MPV: 7.9 fL (ref 0–99.8)
PLATELET COUNT, POC: 310 10*3/uL (ref 142–424)
POC GRANULOCYTE: 7.7 — AB (ref 2–6.9)
POC LYMPH %: 32.6 % (ref 10–50)
POC MID %: 1.9 %M (ref 0–12)
RBC: 4.54 M/uL (ref 4.04–5.48)
RDW, POC: 14 %
WBC: 11.7 10*3/uL — AB (ref 4.6–10.2)

## 2017-09-28 NOTE — Progress Notes (Signed)
09/28/2017 9:01 AM   DOB: 1961-01-08 / MRN: [id number]  SUBJECTIVE:  Valerie [name] is a 57 y.o. female presenting for nausea x7 days.  Patient denies any recent changes in medication.  Just saw her primary and all labs were within normal limits in comparison to previous measures.  Patient denies chest pain shortness of breath.  However, she is concerned given her chronic history of nausea, that something else could be going on today.  She would like her heart checked.  She does associate mildly loose stools.  She is allergic to dilaudid [hydromorphone hcl]; effexor [venlafaxine]; pregabalin; metoclopramide hcl; neurontin [gabapentin]; other; and serotonin reuptake inhibitors (ssris).   She  has a past medical history of Acute ischemic colitis ([place]), Anemia (05/01/2012), Anxiety, ARF (acute renal failure) ([place]) (12/28/2015), Arthritis, Asthma, Basal cell carcinoma of upper back (~ 2011), Chronic right-sided headaches, DDD (degenerative disc disease), lumbar, Diarrhea, Fatigue, Fibromyalgia, First degree heart block, GERD (gastroesophageal reflux disease) (04/06/2017), Headache(784.0), History of ischemic colitis (09/24/2016), Hypertension, Mental disorder, Microscopic polyangiitis ([place]), Nerve pain (2011), Neuromuscular disorder ([place]), Pancreatitis, Pneumonia (~ 2014; 08/2015), Polymyalgia rheumatica ([place]) (onset 08/2015), Precancerous melanosis ([place]), and Sleep apnea.    She  reports that she has never smoked. She has never used smokeless tobacco. She reports that she does not drink alcohol or use drugs. She  reports that she does not currently engage in sexual activity. The patient  has a past surgical history that includes Shoulder arthroscopy with rotator cuff repair and open biceps tenodesis (Bilateral, 2010-2013); Nasal septoplasty w/ turbinoplasty (1998); Esophagogastroduodenoscopy (12/21/2011); Flexible sigmoidoscopy (05/01/2012); Excision basal cell carcinoma (~ 2011);  Esophagogastroduodenoscopy (N/A, 11/06/2012); Botox injection (N/A, 11/06/2012); Esophagogastroduodenoscopy (N/A, 12/04/2013); Botox injection (N/A, 12/04/2013); Esophagogastroduodenoscopy (N/A, 02/26/2014); Botox injection (N/A, 02/26/2014); Esophagogastroduodenoscopy (egd) with propofol (N/A, 07/08/2015); Botox injection (N/A, 07/08/2015); Laparoscopic cholecystectomy (01/2002); Esophagogastroduodenoscopy (egd) with propofol (N/A, 04/27/2016); Botox injection (N/A, 04/27/2016); Flexible sigmoidoscopy (Left, 09/28/2016); Esophagogastroduodenoscopy (N/A, 04/09/2017); and Botox injection (N/A, 04/09/2017).  Her family history includes Arthritis in her mother and paternal grandmother; COPD in her mother; Cancer in her mother; Heart disease in her mother, paternal grandfather, and paternal grandmother; Hypertension in her mother.  Review of Systems  Constitutional: Negative for chills and fever.  Respiratory: Negative for cough.   Cardiovascular: Negative for chest pain and leg swelling.  Gastrointestinal: Positive for diarrhea. Negative for abdominal pain, blood in stool, constipation and melena.  Skin: Negative for itching and rash.  Neurological: Negative for dizziness.    The problem list and medications were reviewed and updated by myself where necessary and exist elsewhere in the encounter.   OBJECTIVE:  BP 126/80 (BP Location: Left Arm, Patient Position: Sitting, Cuff Size: Normal)   Pulse 89   Temp 97.9 F (36.6 C) (Oral)   Resp 18   Ht 5\' 5"  (1.651 m)   Wt 292 lb 12.8 oz (132.8 kg)   SpO2 95%   BMI 48.72 kg/m   Physical Exam  Constitutional: She is oriented to person, place, and time. She appears well-nourished. No distress.  Eyes: Pupils are equal, round, and reactive to light. EOM are normal.  Cardiovascular: Normal rate, regular rhythm, S1 normal, S2 normal, normal heart sounds and intact distal pulses. Exam reveals no gallop, no friction rub and no decreased pulses.  No murmur  heard. Pulmonary/Chest: Effort normal. She has no rales.  Abdominal: Soft. Normal appearance and bowel sounds are normal. She exhibits no distension and no mass. There is no tenderness. There is no rigidity,  no rebound, no guarding and no CVA tenderness.  Musculoskeletal: She exhibits no edema.  Neurological: She is alert and oriented to person, place, and time. No cranial nerve deficit. Gait normal.  Skin: Skin is dry. She is not diaphoretic.  Psychiatric: She has a normal mood and affect.  Vitals reviewed.   Results for orders placed or performed in visit on 09/28/17 (from the past 72 hour(s))  POCT CBC     Status: Abnormal   Collection Time: 09/28/17  8:39 AM  Result Value Ref Range   WBC 11.7 (A) 4.6 - 10.2 K/uL   Lymph, poc 3.8 (A) 0.6 - 3.4   POC LYMPH PERCENT 32.6 10 - 50 %L   MID (cbc) 0.2 0 - 0.9   POC MID % 1.9 0 - 12 %M   POC Granulocyte 7.7 (A) 2 - 6.9   Granulocyte percent 65.5 37 - 80 %G   RBC 4.54 4.04 - 5.48 M/uL   Hemoglobin 13.4 12.2 - 16.2 g/dL   HCT, POC 40.8 37.7 - 47.9 %   MCV 89.7 80 - 97 fL   MCH, POC 29.6 27 - 31.2 pg   MCHC 33.0 31.8 - 35.4 g/dL   RDW, POC 14.0 %   Platelet Count, POC 310 142 - 424 K/uL   MPV 7.9 0 - 99.8 fL  POCT urinalysis dipstick     Status: None   Collection Time: 09/28/17  8:42 AM  Result Value Ref Range   Color, UA yellow yellow   Clarity, UA clear clear   Glucose, UA negative negative mg/dL   Bilirubin, UA negative negative   Ketones, POC UA negative negative mg/dL   Spec Grav, UA 1.015 1.010 - 1.025   Blood, UA negative negative   pH, UA 5.5 5.0 - 8.0   Protein Ur, POC negative negative mg/dL   Urobilinogen, UA 0.2 0.2 or 1.0 E.U./dL   Nitrite, UA Negative Negative   Leukocytes, UA Negative Negative   CBC Latest Ref Rng & Units 09/28/2017 09/20/2017 08/09/2017  WBC 4.6 - 10.2 K/uL 11.7(A) 7.8 7.2  Hemoglobin 12.2 - 16.2 g/dL 13.4 13.2 13.5  Hematocrit 37.7 - 47.9 % 40.8 40.9 41.0  Platelets 150 - 379 x10E3/uL - 305 -      No results found.  ASSESSMENT AND PLAN:  Arthelia was seen today for abdominal pain.  Diagnoses and all orders for this visit:  Nausea without vomiting: EKG unchanged from previous visits.  This is noncardiac.  Patient has been having loose stools and her CBC appears reactive.  Will screen a peripheral smear to ensure there is no bands.  GI profile PCR out as well.  Advised Imodium if the diarrhea persist. -     EKG 12-Lead -     POCT urinalysis dipstick -     POCT CBC -     CMP and Liver -     Lipase -     GI Profile, Stool, PCR  Lymphocytosis -     Pathologist smear review    The patient is advised to call or return to clinic if she does not see an improvement in symptoms, or to seek the care of the closest emergency department if she worsens with the above plan.   Philis Fendt, MHS, PA-C [facility] at [facility] 09/28/2017 9:01 AM

## 2017-09-28 NOTE — Patient Instructions (Addendum)
  Lab is giving you a kit and instructions to take home with you. We will be elevating your stool and your smear.   IF you received an x-ray today, you will receive an invoice from [facility]. Please contact [facility] at [phone] with questions or concerns regarding your invoice.   IF you received labwork today, you will receive an invoice from [facility]. Please contact [facility] at [phone] with questions or concerns regarding your invoice.   Our billing staff will not be able to assist you with questions regarding bills from these companies.  You will be contacted with the lab results as soon as they are available. The fastest way to get your results is to activate your My Chart account. Instructions are located on the last page of this paperwork. If you have not heard from [place] regarding the results in 2 weeks, please contact this office.

## 2017-09-29 LAB — CMP AND LIVER
ALT: 13 IU/L (ref 0–32)
AST: 16 IU/L (ref 0–40)
Albumin: 4.6 g/dL (ref 3.5–5.5)
Alkaline Phosphatase: 66 IU/L (ref 39–117)
BILIRUBIN TOTAL: 0.3 mg/dL (ref 0.0–1.2)
BUN: 30 mg/dL — AB (ref 6–24)
Bilirubin, Direct: 0.08 mg/dL (ref 0.00–0.40)
CHLORIDE: 96 mmol/L (ref 96–106)
CO2: 22 mmol/L (ref 20–29)
Calcium: 10.1 mg/dL (ref 8.7–10.2)
Creatinine, Ser: 1.31 mg/dL — ABNORMAL HIGH (ref 0.57–1.00)
GFR calc Af Amer: 53 mL/min/{1.73_m2} — ABNORMAL LOW (ref >59)
GFR, EST NON AFRICAN AMERICAN: 46 mL/min/{1.73_m2} — AB (ref >59)
GLUCOSE: 124 mg/dL — AB (ref 65–99)
Potassium: 4.7 mmol/L (ref 3.5–5.2)
Sodium: 136 mmol/L (ref 134–144)
Total Protein: 7.6 g/dL (ref 6.0–8.5)

## 2017-09-29 LAB — LIPASE: Lipase: 59 U/L (ref 14–72)

## 2017-10-01 DIAGNOSIS — R197 Diarrhea, unspecified: Secondary | ICD-10-CM | POA: Diagnosis not present

## 2017-10-01 DIAGNOSIS — R110 Nausea: Secondary | ICD-10-CM | POA: Diagnosis not present

## 2017-10-01 DIAGNOSIS — D72820 Lymphocytosis (symptomatic): Secondary | ICD-10-CM | POA: Diagnosis not present

## 2017-10-02 DIAGNOSIS — M542 Cervicalgia: Secondary | ICD-10-CM | POA: Diagnosis not present

## 2017-10-03 ENCOUNTER — Encounter: Payer: Self-pay | Admitting: Physician Assistant

## 2017-10-03 LAB — GI PROFILE, STOOL, PCR
Adenovirus F 40/41: NOT DETECTED
Astrovirus: NOT DETECTED
C difficile toxin A/B: NOT DETECTED
CYCLOSPORA CAYETANENSIS: NOT DETECTED
Campylobacter: NOT DETECTED
Cryptosporidium: NOT DETECTED
ENTAMOEBA HISTOLYTICA: NOT DETECTED
Enteroaggregative E coli: NOT DETECTED
Enteropathogenic E coli: NOT DETECTED
Enterotoxigenic E coli: NOT DETECTED
GIARDIA LAMBLIA: NOT DETECTED
Norovirus GI/GII: NOT DETECTED
PLESIOMONAS SHIGELLOIDES: NOT DETECTED
Rotavirus A: NOT DETECTED
SALMONELLA: NOT DETECTED
SAPOVIRUS: NOT DETECTED
SHIGELLA/ENTEROINVASIVE E COLI: NOT DETECTED
Shiga-toxin-producing E coli: NOT DETECTED
VIBRIO CHOLERAE: NOT DETECTED
VIBRIO: NOT DETECTED
YERSINIA ENTEROCOLITICA: NOT DETECTED

## 2017-10-04 ENCOUNTER — Other Ambulatory Visit: Payer: Self-pay

## 2017-10-04 ENCOUNTER — Other Ambulatory Visit: Payer: Self-pay | Admitting: Family Medicine

## 2017-10-04 DIAGNOSIS — Z9109 Other allergy status, other than to drugs and biological substances: Secondary | ICD-10-CM

## 2017-10-05 DIAGNOSIS — Z79899 Other long term (current) drug therapy: Secondary | ICD-10-CM | POA: Diagnosis not present

## 2017-10-05 DIAGNOSIS — G894 Chronic pain syndrome: Secondary | ICD-10-CM | POA: Diagnosis not present

## 2017-10-05 DIAGNOSIS — G4733 Obstructive sleep apnea (adult) (pediatric): Secondary | ICD-10-CM | POA: Diagnosis not present

## 2017-10-05 DIAGNOSIS — N183 Chronic kidney disease, stage 3 (moderate): Secondary | ICD-10-CM | POA: Diagnosis not present

## 2017-10-05 DIAGNOSIS — I129 Hypertensive chronic kidney disease with stage 1 through stage 4 chronic kidney disease, or unspecified chronic kidney disease: Secondary | ICD-10-CM | POA: Diagnosis not present

## 2017-10-05 DIAGNOSIS — Z6841 Body Mass Index (BMI) 40.0 and over, adult: Secondary | ICD-10-CM | POA: Diagnosis not present

## 2017-10-07 LAB — PATHOLOGIST SMEAR REVIEW
BASOS: 0 %
Basophils Absolute: 0 10*3/uL (ref 0.0–0.2)
EOS (ABSOLUTE): 0.2 10*3/uL (ref 0.0–0.4)
Eos: 2 %
HEMOGLOBIN: 14.6 g/dL (ref 11.1–15.9)
Hematocrit: 44 % (ref 34.0–46.6)
IMMATURE GRANULOCYTES: 0 %
Immature Grans (Abs): 0 10*3/uL (ref 0.0–0.1)
LYMPHS ABS: 3.7 10*3/uL — AB (ref 0.7–3.1)
Lymphs: 33 %
MCH: 30.2 pg (ref 26.6–33.0)
MCHC: 33.2 g/dL (ref 31.5–35.7)
MCV: 91 fL (ref 79–97)
MONOS ABS: 0.7 10*3/uL (ref 0.1–0.9)
Monocytes: 6 %
NEUTROS PCT: 59 %
Neutrophils Absolute: 6.7 10*3/uL (ref 1.4–7.0)
PATH REV RBC: NORMAL
Path Rev PLTs: NORMAL
Platelets: 287 10*3/uL (ref 150–379)
RBC: 4.84 x10E6/uL (ref 3.77–5.28)
RDW: 13.9 % (ref 12.3–15.4)
WBC: 11.4 10*3/uL — ABNORMAL HIGH (ref 3.4–10.8)

## 2017-10-09 ENCOUNTER — Encounter: Payer: Self-pay | Admitting: Neurology

## 2017-10-11 ENCOUNTER — Ambulatory Visit (INDEPENDENT_AMBULATORY_CARE_PROVIDER_SITE_OTHER): Payer: Medicare Other | Admitting: Neurology

## 2017-10-11 ENCOUNTER — Ambulatory Visit: Payer: Medicare Other | Admitting: Neurology

## 2017-10-11 ENCOUNTER — Encounter: Payer: Self-pay | Admitting: Neurology

## 2017-10-11 VITALS — BP 105/70 | HR 66 | Ht 65.0 in | Wt 297.0 lb

## 2017-10-11 DIAGNOSIS — M318 Other specified necrotizing vasculopathies: Secondary | ICD-10-CM

## 2017-10-11 DIAGNOSIS — G4710 Hypersomnia, unspecified: Secondary | ICD-10-CM

## 2017-10-11 DIAGNOSIS — Z1589 Genetic susceptibility to other disease: Secondary | ICD-10-CM | POA: Diagnosis not present

## 2017-10-11 DIAGNOSIS — Z9989 Dependence on other enabling machines and devices: Secondary | ICD-10-CM | POA: Diagnosis not present

## 2017-10-11 DIAGNOSIS — G4733 Obstructive sleep apnea (adult) (pediatric): Secondary | ICD-10-CM

## 2017-10-11 DIAGNOSIS — M542 Cervicalgia: Secondary | ICD-10-CM | POA: Diagnosis not present

## 2017-10-11 DIAGNOSIS — I7782 Antineutrophilic cytoplasmic antibody (ANCA) vasculitis: Secondary | ICD-10-CM

## 2017-10-11 MED ORDER — MODAFINIL 200 MG PO TABS: 100.0000 mg | ORAL_TABLET | Freq: Two times a day (BID) | ORAL | 3 refills | Status: DC

## 2017-10-11 NOTE — Addendum Note (Signed)
Addended by: Larey Seat on: 10/11/2017 11:29 AM   Modules accepted: Orders

## 2017-10-11 NOTE — Progress Notes (Signed)
[facility]  Provider:  Dr Santiaga Butzin Referring Provider: Shawnee Knapp, MD Primary Care Physician:  [name], MD  Chief Complaint  Patient presents with  . Follow-up    pt alone, rm 10. pt states things are going ok. she has only went to the ER since last year. D[facility]    This patient's primary neurologist is Dr. Floyde Parkins.   Interval history from 10/11/2016. 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 She had just been discharged after a prolonged hospitalization  On April 14 th - 21 2018, for bleeding. and in the meantime has actually learned her diagnosis of microscopic polyangiitis. This explains her renal disorder, fatigue, GI bleeds and overall pain. Her rheumatologist just started her on methotrexate. She continues now to use CPAP endorsed the Epworth sleepiness score at 11 points fatigue severity at 48 points, she is highly compliant patient uses the machine 93% of the last 30 days with an average of 8 hours and 1 minute, the machine is an AutoSet between 5 and 15 cm water pressure with 2 cm expiratory pressure relief. Residual AHI is 2.0 pressure at the 91st percentile is 10 cm water she does have mild to moderate air leaks.      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 Joseph Art for  OSA , diagnosed after a  sleep study on  12-22-2008 at [facility]. She underwent a SPLIT study 02-18-13  here at [place] . The patient underwent a retitration here at [facility] lab  on 9-9 2014,  with Dr. Joseph Art listed as the referring physician.  She could not tolerate the CPAP settings that she had moved here with from [place] in early 2010.  At the time at [facility] she was titrated to 9 cm water- now for night use later re\re  titration resulted in a pressure of 20 and an AHI of 20.6 RDI of 33.8 REM AHI of 21.2. There was no tachybradycardia arrhythmia and altered no prolonged desaturation altered, and no periodic limb movements seen. The patient was now actually titrated to 12 cm water she is now using a ResMed Mirage mask in a small size and uses heated humidity. The machine is the same she had 2010 .  Valerie [name] will need a prescription for the full facemask that she has used before and that she prefers using in times of nasal allergic rhinitis. She also will have cushions refilled for her nasal pillow mask. She reports that over the last week for 2 weeks she has become excessively daytime sleepy again currently she endorsed the Epworth sleepiness score on at 13 points and the fatigue severity score at 53-54 points. This is dated 08-19-14     Her sleep habits and social habits have not changed in any way to explain the increase in daytime sleepiness and fatigue. She takes Zyrtec in the morning during her allergy season which should not give her this degree of sleepiness. She had no infection or inflammatory disorders that she is aware of. He is easier short of breath and she does have some underlying wheezing which have been mentioned by Dr. Verline Lema. She never had wheezing before 2015 there of. This may be an asthmatic component  and she has Proventil as an inhaler available .her voice in the choir is affected. Her endurance span is shorter.  Interval history from 12/30/2015. Valerie [name] was just released yesterday from a hospital stay necessitated by repeated loss of consciousness or loss of awareness, interpreted as sleep attacks!!!. She was admitted with acute renal failure and was confused. She states she spent the evening at a friend's house where she seemed to lose awareness of her surroundings, she reports felt falling asleep in the dry for. And neighbor knocked on the window of her car and woke her up. There have been  several of these spells on Saturday last. By Monday she was admitted to hospital. This is not related to a sleep disorder with a metabolic problem with acute renal failure. 1. Confusion 2. Drowsiness 3. Dehydration 4. Abnormal urinalysis - Will cover patient for possible pyelonephritis with IM ceftriaxone given UA. We will also obtain head CT to r/o intracranial process. Consider using ciprofloxacin to supplement ceftriaxone. Urine culture pending. Differential includes meningitis, adverse effects from multiple strong pain medications/CS. 5. Chronic pain syndrome - Counseled on use of controlled substances, patient insists that this is not a contributing factor. P ANCA  Positive.    Interval history from 11 Oct 2017 I had the pleasure of meeting with Valerie [name] today who has been a long-standing CPAP user.  Mrs. Smart has been compliant her download confirms 97% compliance with an average usage time of 8 hours and 10 minutes, she is using an AutoSet CPAP between 5 and 15 cmH2O is 2 cm expiratory pressure relief.  Her residual AHI is only 2.5 there are no significant central apneas emergent, 95th percentile pressure is 11.3 cmH2O, well was in the pressure window was given.  She does have some minor air leaks.  She continues to be excessively daytime sleepy her Epworth score is between 10 and 12, she also struggles with fatigue and endorses fatigue severity score of 51 points.  Last year she had tested positive for a P ANCA, a repeat test however was negative she was referred to [facility] rheumatology and was diagnosed with an HLA B 27, likely reflecting ankylosing spondylitis.  She underwent ECHO cardiography, LV hypertrophy, normal EF.   This may explain a lot of her joint pain and some of the residual fatigue as well.  Autoimmune diseases all contribute to fatigue syndromes.  Her persistent hypersomnia and fatigue do not bear the hallmarks of narcolepsy also she does struggle to stay awake and physically  not active and mentally not stimulated. She does not endorse vivid dreams and rare, isolated  sleep paralysis. She feels sometimes frozen in her joints, not a muscle weakness.    ROS , her Epworth score and her fatigue severity have not changed over the last 18 months. Patients with renal failure are still allowed to take modafinil as it is a hepatic metabolized medication. The same is true for her sleep and pain medication. Epworth 11,  FSS 48 ,  she is using Provigil with some success .  She is taking morphine 60 mg 3 times a day but she takes it only once a day and has been monitored by Dr. Verline Lema. Baclofen does help very little, makes her more sleepy. She is actively followed by pain clinic.Intercostal pain, spasms , joint pain.     Current Outpatient Medications  Medication Sig Dispense Refill  . albuterol (PROAIR HFA) 108 (90 Base) MCG/ACT inhaler Inhale 2 puffs every 4 (four) hours as  needed into the lungs for wheezing or shortness of breath. 8.5 each 11  . allopurinol (ZYLOPRIM) 300 MG tablet TAKE 1/2 TABLET BY MOUTH ONCE A DAY 45 tablet 0  . ALPRAZolam (XANAX) 1 MG tablet Take 1 tablet (1 mg total) by mouth 3 (three) times daily as needed for anxiety. 90 tablet 1  . baclofen (LIORESAL) 10 MG tablet Take 10 mg by mouth 3 (three) times daily. Takes as needed    . benazepril (LOTENSIN) 20 MG tablet Take 1 tablet (20 mg total) daily by mouth. 90 tablet 3  . calcium carbonate (OSCAL) 1500 (600 Ca) MG TABS tablet Take 600 mg of elemental calcium by mouth daily with breakfast.    . cetirizine (ZYRTEC) 10 MG tablet Take 10 mg by mouth daily. Alternates between Allegra and Zyrtec as needed for allergies    . Cholecalciferol (VITAMIN D3) 5000 units CAPS Take 1 capsule by mouth.    . dexlansoprazole (DEXILANT) 60 MG capsule Take 60 mg by mouth 2 (two) times daily.    . fexofenadine (ALLEGRA) 180 MG tablet Take 180 mg by mouth daily as needed. Switches between Allegra and Zyrtec as needed for  allergies    . fluticasone (FLONASE) 50 MCG/ACT nasal spray PLACE 2 SPRAYS INTO BOTH NOSTRILS DAILY 48 g 1  . HYDROcodone-acetaminophen (NORCO) 10-325 MG per tablet Take 1-2 tablets by mouth every 6 (six) hours as needed for moderate pain (pain.).     Marland Kitchen hydrOXYzine (ATARAX/VISTARIL) 10 MG tablet Take 1-3 tablets (10-30 mg total) by mouth 3 (three) times daily as needed. 90 tablet 1  . ipratropium (ATROVENT) 0.03 % nasal spray Place 2 sprays 4 (four) times daily into the nose. 30 mL 1  . metoprolol succinate (TOPROL-XL) 100 MG 24 hr tablet Take 1 tablet (100 mg total) by mouth daily. Take with or immediately following a meal. 90 tablet 3  . modafinil (PROVIGIL) 200 MG tablet Take 0.5 tablets (100 mg total) by mouth 2 (two) times daily. 90 tablet 1  . montelukast (SINGULAIR) 10 MG tablet TAKE ONE TABLET BY MOUTH EVERY NIGHT AT BEDTIME 90 tablet 2  . morphine (MS CONTIN) 30 MG 12 hr tablet Take 30 mg by mouth every 8 (eight) hours. Not from [place]    . Multiple Vitamins-Minerals (MULTIVITAMIN PO) Take 1 tablet by mouth daily.    . Olopatadine HCl (PAZEO) 0.7 % SOLN Apply 1 drop to eye daily. 2.5 mL 11  . ondansetron (ZOFRAN) 4 MG tablet Take 1 tablet (4 mg total) by mouth every 8 (eight) hours as needed for nausea or vomiting. 20 tablet 0  . Pancrelipase, Lip-Prot-Amyl, (CREON) 24000 UNITS CPEP Take 72,000 Units by mouth 3 (three) times daily as needed (for pancreatitis).     . promethazine (PHENERGAN) 25 MG tablet Take 1 tablet (25 mg total) by mouth every 6 (six) hours as needed for nausea or vomiting. 30 tablet 1  . spironolactone (ALDACTONE) 100 MG tablet Take 1 tablet (100 mg total) by mouth daily. 90 tablet 0  . sucralfate (CARAFATE) 1 GM/10ML suspension Take 10 mLs (1 g total) by mouth 4 (four) times daily as needed (stomach pain). 420 mL 1  . torsemide (DEMADEX) 10 MG tablet Take 1 tablet (10 mg total) by mouth daily. 30 tablet 1   No current facility-administered medications for this visit.      Allergies as of 10/11/2017 - Review Complete 10/11/2017  Allergen Reaction Noted  . Dilaudid [hydromorphone hcl] Other (See Comments) 08/01/2011  .  Effexor [venlafaxine]  03/09/2017  . Pregabalin Other (See Comments) 08/01/2011  . Metoclopramide hcl Other (See Comments) 08/01/2011  . Neurontin [gabapentin] Other (See Comments) 08/01/2011  . Other Other (See Comments) 08/01/2011  . Serotonin reuptake inhibitors (ssris) Other (See Comments) 11/18/2016   Creatinine, Ser 0.44 - 1.00 mg/dL 1.82 (H) 2.53 (H) 5.58 (H)R, CM 1.27 (H)R, CM     GFR calc non Af Amer >60 mL/min 31 (L) 60 mL/min" class="rz_[id number]" style="cursor: pointer;" onmouseover='jscript: var varStyle="underline"; var bgColor="#[id number]"; this.style.backgroundColor=bgColor; var children=this.getElementsByTagName("div"); for(var child=0;child 21 (L)      GFR calc Af Amer >60 mL/min 35 (L) 60 mL/min" class="rz_1b" style="cursor: pointer;" onmouseover='jscript: var varStyle="underline"; var bgColor="#[id number]"; this.style.backgroundColor=bgColor; var children=this.getElementsByTagName("div"); for(var child=0;child 24 (L)CM     Comments: (NOTE)  The eGFR has been calculated using the CKD EPI equation.  This calculation has not been validated in all clinical situations.  eGFR's persistently <60 mL/min signify possible Chronic Kidney  Disease.           Vitals: BP 105/70   Pulse 66   Ht '5\' 5"'  (1.651 m)   Wt 297 lb (134.7 kg)   BMI 49.42 kg/m  Last Weight:  Wt Readings from Last 1 Encounters:  10/11/17 297 lb (134.7 kg)   Last Height:   Ht Readings from Last 1 Encounters:  10/11/17 '5\' 5"'  (1.651 m)   Vision Screening:  Vitals   Physical exam:  General: The patient is awake, alert and appears not in acute distress. The patient is well groomed. Head: Normocephalic, atraumatic. Neck is supple. Large neck but low gradeupper airway restriction. Mallampati 2, narrow laterally , but high , peaked palate - short airway ,  reddened mucosa.   neck circumference:18. 5 inches! . Cardiovascular:  Regular rate and rhythm, without  murmurs or carotid bruit, and without distended neck veins. Respiratory: Lungs are clear to auscultation. Skin:  Without evidence of edema, or rash Trunk: BMI is severely elevated ,  Stooped  posture.morbidly obese.   Neurologic exam : The patient is awake and alert, oriented to place and time.  Memory subjective  described as intact. There is a normal attention span & concentration ability.  Speech is fluent without dysarthria, dysphonia or aphasia. Mood and affect are appropriate.  Cranial nerves: Sense of smell is impaired , and she reported last year a metallic taste in her mouth. Tongue no longer " metallic "- recovered with the recovery from acute renal failure. . Pupils are equal and briskly reactive to light. Extraocular movements  in vertical and horizontal planes intact and without nystagmus. Visual fields by finger perimetry are intact.Hearing to finger rub intact. Facial sensation intact to fine touch. Facial motor strength is symmetric and tongue and uvula move midline.  Motor exam:   She reports pain in both legs with Valsalva!  Normal tone and normal muscle bulk and symmetric normal strength in all extremities. ROM limitation in  Right hip,  Left and right shoulder.  Sensory:  Fine touch, pinprick and vibration were tested in all extremities. Proprioception is tested in the upper extremities only. This was normal. Coordination: Rapid alternating movements in the fingers/hands is tested and normal.  Gait and station: Patient walks without assistive device and is able unassisted to climb up to the exam table. Deep tendon reflexes: in the  upper and lower extremities are symmetric and intact. Babinski maneuver response is downgoing.   Assessment:  After physical and neurologic examination, review of laboratory studies, imaging, neurophysiology testing and pre-existing records,  assessment  is that :   1) OSA with CPAP at 5- 15  cm water, auto papa , EPR at 2 cm water and highly compliant, but fatigued and persistently sleepy.     2) HLA B 27 positive, suspected to have ankylosing spondylitis. Question : peripheral neuropathy, brachial plexus injury - seen by Dr. Jannifer Franklin. Not improved .  chronic pain , in pain therapy - non narcotics from [facility] - related to autoimmune disorder.   3) acute renal injury - recovered, but has hypokalemia on meds, had significant edema , needs diuretics- vascultis.   ischemic colitis twice- vasculitis?    Plan:  Continue CPAP and obtain new supplies - sleep clinic follow up once a year. See Dr Jannifer Franklin for neuropathy as needed.   Visit 35 minutes with obtaining ED notes and [facility] progress notes, nephrology notes, discussion of newly diagnosed autoimmune disorder and follow up on acute renal injury.    Pain, arthralgia and autoimmune diseases can cause colitis / renal disease,  I follow for OSA only,    Larey Seat, MD   Cc Dr Jannifer Franklin, Dr Joseph Art

## 2017-10-12 ENCOUNTER — Telehealth: Payer: Self-pay

## 2017-10-12 NOTE — Telephone Encounter (Signed)
We received a prior authorization request for this medication. I have completed and submitted the PA on Cover My Meds and should have a determination within 48-72 hours.  Cover My Meds Key: [id number]

## 2017-10-15 NOTE — Telephone Encounter (Signed)
PA Approved for the pt until 06/11/2018.  Referral # [id number] Approved through [place]

## 2017-10-18 DIAGNOSIS — M542 Cervicalgia: Secondary | ICD-10-CM | POA: Diagnosis not present

## 2017-10-19 ENCOUNTER — Other Ambulatory Visit: Payer: Self-pay

## 2017-10-19 ENCOUNTER — Encounter: Payer: Self-pay | Admitting: Family Medicine

## 2017-10-19 ENCOUNTER — Ambulatory Visit (INDEPENDENT_AMBULATORY_CARE_PROVIDER_SITE_OTHER): Payer: Medicare Other

## 2017-10-19 ENCOUNTER — Ambulatory Visit (INDEPENDENT_AMBULATORY_CARE_PROVIDER_SITE_OTHER): Payer: Medicare Other | Admitting: Family Medicine

## 2017-10-19 VITALS — BP 126/78 | HR 74 | Temp 98.7°F | Resp 18 | Ht 65.0 in | Wt 296.4 lb

## 2017-10-19 DIAGNOSIS — N183 Chronic kidney disease, stage 3 unspecified: Secondary | ICD-10-CM

## 2017-10-19 DIAGNOSIS — F411 Generalized anxiety disorder: Secondary | ICD-10-CM

## 2017-10-19 DIAGNOSIS — F418 Other specified anxiety disorders: Secondary | ICD-10-CM

## 2017-10-19 DIAGNOSIS — M25562 Pain in left knee: Secondary | ICD-10-CM

## 2017-10-19 DIAGNOSIS — R195 Other fecal abnormalities: Secondary | ICD-10-CM | POA: Diagnosis not present

## 2017-10-19 DIAGNOSIS — R110 Nausea: Secondary | ICD-10-CM | POA: Diagnosis not present

## 2017-10-19 DIAGNOSIS — M1712 Unilateral primary osteoarthritis, left knee: Secondary | ICD-10-CM | POA: Diagnosis not present

## 2017-10-19 LAB — POCT CBC
Granulocyte percent: 64.6 %G (ref 37–80)
HEMATOCRIT: 38.2 % (ref 37.7–47.9)
HEMOGLOBIN: 12.6 g/dL (ref 12.2–16.2)
LYMPH, POC: 2.6 (ref 0.6–3.4)
MCH, POC: 29.5 pg (ref 27–31.2)
MCHC: 33.1 g/dL (ref 31.8–35.4)
MCV: 89 fL (ref 80–97)
MID (cbc): 0.4 (ref 0–0.9)
MPV: 7.5 fL (ref 0–99.8)
PLATELET COUNT, POC: 312 10*3/uL (ref 142–424)
POC GRANULOCYTE: 5.6 (ref 2–6.9)
POC LYMPH %: 30.8 % (ref 10–50)
POC MID %: 4.6 %M (ref 0–12)
RBC: 4.29 M/uL (ref 4.04–5.48)
RDW, POC: 14.5 %
WBC: 8.6 10*3/uL (ref 4.6–10.2)

## 2017-10-19 LAB — COMPREHENSIVE METABOLIC PANEL
A/G RATIO: 1.6 (ref 1.2–2.2)
ALBUMIN: 4.3 g/dL (ref 3.5–5.5)
ALK PHOS: 55 IU/L (ref 39–117)
ALT: 14 IU/L (ref 0–32)
AST: 18 IU/L (ref 0–40)
BUN / CREAT RATIO: 17 (ref 9–23)
BUN: 26 mg/dL — ABNORMAL HIGH (ref 6–24)
Bilirubin Total: <0.2 mg/dL (ref 0.0–1.2)
CALCIUM: 9.5 mg/dL (ref 8.7–10.2)
CO2: 21 mmol/L (ref 20–29)
Chloride: 102 mmol/L (ref 96–106)
Creatinine, Ser: 1.56 mg/dL — ABNORMAL HIGH (ref 0.57–1.00)
GFR calc Af Amer: 43 mL/min/{1.73_m2} — ABNORMAL LOW (ref >59)
GFR, EST NON AFRICAN AMERICAN: 37 mL/min/{1.73_m2} — AB (ref >59)
GLOBULIN, TOTAL: 2.7 g/dL (ref 1.5–4.5)
Glucose: 121 mg/dL — ABNORMAL HIGH (ref 65–99)
POTASSIUM: 4.6 mmol/L (ref 3.5–5.2)
SODIUM: 137 mmol/L (ref 134–144)
Total Protein: 7 g/dL (ref 6.0–8.5)

## 2017-10-19 LAB — POCT URINALYSIS DIP (MANUAL ENTRY)
Bilirubin, UA: NEGATIVE
Blood, UA: NEGATIVE
Glucose, UA: NEGATIVE mg/dL
Ketones, POC UA: NEGATIVE mg/dL
LEUKOCYTES UA: NEGATIVE
NITRITE UA: NEGATIVE
PH UA: 5.5 (ref 5.0–8.0)
PROTEIN UA: NEGATIVE mg/dL
Spec Grav, UA: 1.015 (ref 1.010–1.025)
UROBILINOGEN UA: 0.2 U/dL

## 2017-10-19 MED ORDER — HYDROXYZINE HCL 10 MG PO TABS
10.0000 mg | ORAL_TABLET | Freq: Three times a day (TID) | ORAL | 1 refills | Status: DC | PRN
Start: 2017-10-19 — End: 2018-02-22

## 2017-10-19 NOTE — Patient Instructions (Addendum)
Dg Knee 1-2 Views Left  Result Date: 10/19/2017 CLINICAL DATA:  Left knee pain of unknown etiology. EXAM: LEFT KNEE - 1-2 VIEW COMPARISON:  None. FINDINGS: No acute bony or joint abnormality is identified. Medial and lateral compartment joint spaces are preserved osteophytosis most prominent laterally. There is also osteophytosis about the patellofemoral joint. No joint effusion. No focal bony lesion. No chondrocalcinosis. 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 Electronically Signed   By: Inge Rise M.D.   On: 10/19/2017 09:20    IF you received an x-ray today, you will receive an invoice from [facility]. Please contact [facility] at [phone] with questions or concerns regarding your invoice.   IF you received labwork today, you will receive an invoice from [facility]. Please contact [facility] at [phone] with questions or concerns regarding your invoice.   Our billing staff will not be able to assist you with questions regarding bills from these companies.  You will be contacted with the lab results as soon as they are available. The fastest way to get your results is to activate your My Chart account. Instructions are located on the last page of this paperwork. If you have not heard from [place] regarding the results in 2 weeks, please contact this office.     Meniscus Tear A meniscus tear is a knee injury in which a piece of the meniscus is torn. The meniscus is a thick, rubbery, wedge-shaped cartilage in the knee. Two menisci are located in each knee. They sit between the upper bone (femur) and lower bone (tibia) that make up the knee joint. Each meniscus acts as a shock absorber for the knee. A torn meniscus is one of the most common types of knee injuries. This injury can range from mild to severe. Surgery may be needed for a severe tear. What are the causes? This injury may be caused by any squatting, twisting, or pivoting  movement. Sports-related injuries are the most common cause. These often occur from:  Running and stopping suddenly.  Changing direction.  Being tackled or knocked off your feet.  As people get older, their meniscus gets thinner and weaker. In these people, tears can happen more easily, such as from climbing stairs. What increases the risk? This injury is more likely to happen to:  People who play contact sports.  Males.  People who are 67?56 years of age.  What are the signs or symptoms? Symptoms of this injury include:  Knee pain, especially at the side of the knee joint. You may feel pain when the injury occurs, or you may only hear a pop and feel pain later.  A feeling that your knee is clicking, catching, locking, or giving way.  Not being able to fully bend or extend your knee.  Bruising or swelling in your knee.  How is this diagnosed? This injury may be diagnosed based on your symptoms and a physical exam. The physical exam may include:  Moving your knee in different ways.  Feeling for tenderness.  Listening for a clicking sound.  Checking if your knee locks or catches.  You may also have tests, such as:  X-rays.  MRI.  A procedure to look inside your knee with a narrow surgical telescope (arthroscopy).  You may be referred to a knee specialist (orthopedic surgeon). How is this treated? Treatment for this injury depends on the severity of the tear. Treatment for a mild tear may include:  Rest.  Medicine to reduce pain and swelling. This  is usually a nonsteroidal anti-inflammatory drug (NSAID).  A knee brace or an elastic sleeve or wrap.  Using crutches or a walker to keep weight off your knee and to help you walk.  Exercises to strengthen your knee (physical therapy).  You may need surgery if you have a severe tear or if other treatments are not working. Follow these instructions at home: Managing pain and swelling  Take over-the-counter and  prescription medicines only as told by your health care provider.  If directed, apply ice to the injured area: ? Put ice in a plastic bag. ? Place a towel between your skin and the bag. ? Leave the ice on for 20 minutes, 2-3 times per day.  Raise (elevate) the injured area above the level of your heart while you are sitting or lying down. Activity  Do not use the injured limb to support your body weight until your health care provider says that you can. Use crutches or a walker as told by your health care provider.  Return to your normal activities as told by your health care provider. Ask your health care provider what activities are safe for you.  Perform range-of-motion exercises only as told by your health care provider.  Begin doing exercises to strengthen your knee and leg muscles only as told by your health care provider. After you recover, your health care provider may recommend these exercises to help prevent another injury. General instructions  Use a knee brace or elastic wrap as told by your health care provider.  Keep all follow-up visits as told by your health care provider. This is important. Contact a health care provider if:  You have a fever.  Your knee becomes red, tender, or swollen.  Your pain medicine is not helping.  Your symptoms get worse or do not improve after 2 weeks of home care. This information is not intended to replace advice given to you by your health care provider. Make sure you discuss any questions you have with your health care provider. Document Released: 08/19/2002 Document Revised: 11/04/2015 Document Reviewed: 09/21/2014 Elsevier Interactive Patient Education  2018 [employer].  What You Need to Know About Osteoarthritis Osteoarthritis is a type of arthritis that affects tissue that covers the ends of bones in joints (cartilage). Cartilage acts as a cushion between the bones and helps them move smoothly. Osteoarthritis results when  cartilage in the joints gets worn down. Osteoarthritis is sometimes called "wear and tear" arthritis. Osteoarthritis can affect any joint and can make movement painful. Hips, knees, fingers, and toes are some of the joints that are most often affected by osteoarthritis. You may be more likely to develop osteoarthritis if:  You are middle-aged or older.  You are obese.  You have injured a joint or had surgery on a joint.  You have a family history of osteoarthritis.  How can osteoarthritis affect me? Osteoarthritis can cause:  Pain and swelling in your joint.  Difficulty moving your joint.  A grating or scraping feeling inside the joint when you move it.  Popping or creaking sounds when you move.  This condition can make it harder to do things that you need or want to do each day. Osteoarthritis in a major joint, such as your knee or hip, can make it painful to walk or exercise. If you have osteoarthritis in your hands, you might not be able to grip items, twist your hand, or control small movements of your hands and fingers (fine motor skills). Over  time, osteoarthritis could cause you to be less physically active. Being less active increases your risk for other long-term (chronic) health problems, such as diabetes and heart disease. What lifestyle changes can be made? You can lessen the impact that osteoarthritis has on your daily life by:  Switching to low-impact activities that do not put repeated pressure on your joints. For example, if you usually run or jog for exercise, try swimming or riding a bike instead.  Staying active. Build up to at least 150 minutes of physical activity each week to keep your joints healthy and keep your body strong.  Losing weight. If you are overweight or obese, losing weight can take pressure off of your joints. If you need help with weight loss, talk with your health care provider or a diet and nutrition specialist (dietitian).  What other changes  can be made? You can also lessen the effect of osteoarthritis by:  Using assistive devices. Sometimes a brace, wrap, splint, specialized glove, or cane can help. Talk with your health care provider or physical therapist about when and how to use these.  Working with a physical therapist who can help you find ways to do your daily activities without harming your joints. A physical therapist can also teach you exercises and stretches to strengthen the muscles that support your joints.  Treating pain and inflammation. Take over-the-counter and prescription medicines for pain and inflammation only as told by your health care provider. If directed, you may put ice on the affected joint: ? If you have a removable assistive device, remove it as told by your health care provider. ? Put ice in a plastic bag. ? Place a towel between your skin and the bag. ? Leave the ice on for 20 minutes, 2-3 times a day.  If other measures do not work, you may need joint surgery, such as joint replacement. What can happen if changes are not made? Osteoarthritis is a condition that gets worse over time (a progressive condition). If you do not take steps to strengthen your body and to slow down the progress of the disease, your condition may get worse more quickly. Your joints may stiffen and become swollen, which will make them painful and hard to move. Where to find more information: You can learn more about osteoarthritis from:  [employer] [place]: www.RadioSc[employer].is  [employer] of Arthritis and Musculoskeletal and Skin Diseases: www.niams.CityPerson.tn  Contact a health care provider if:  You cannot do your normal activities comfortably.  Your joint does not function at all.  Your pain is interfering with your sleep.  You are gaining weight.  Your joint appears to be changing in shape, instead of just being swollen  and sore. Summary  Osteoarthritis is a painful joint disease that gets worse over time.  This condition can lead to other long-term (chronic) health problems.  There are changes that you can make to slow down the progression of the disease. This information is not intended to replace advice given to you by your health care provider. Make sure you discuss any questions you have with your health care provider. Document Released: 01/18/2016 Document Revised: 01/20/2016 Document Reviewed: 01/18/2016 Elsevier Interactive Patient Education  2018 [employer].  How to Use a Knee Brace A knee brace is a device that you wear to support your knee, especially if the knee is healing after an injury or surgery. There are several types of knee braces. Some are designed to prevent an injury (prophylactic brace). These are  often worn during sports. Others support an injured knee (functional brace) or keep it still while it heals (rehabilitative brace). People with severe arthritis of the knee may benefit from a brace that takes some pressure off the knee (unloader brace). Most knee braces are made from a combination of cloth and metal or plastic. You may need to wear a knee brace to:  Relieve knee pain.  Help your knee support your weight (improve stability).  Help you walk farther (improve mobility).  Prevent injury.  Support your knee while it heals from surgery or from an injury.  What are the risks? Generally, knee braces are very safe to wear. However, problems may occur, including:  Skin irritation that may lead to infection.  Making your condition worse if you wear the brace in the wrong way.  How to use a knee brace Different braces will have different instructions for use. Your health care provider will tell you or show you:  How to put on your brace.  How to adjust the brace.  When and how often to wear the brace.  How to remove the brace.  If you will need any assistive  devices in addition to the brace, such as crutches or a cane.  In general, your brace should:  Have the hinge of the brace line up with the bend of your knee.  Have straps, hooks, or tapes that fasten snugly around your leg.  Not feel too tight or too loose.  How to care for a knee brace  Check your brace often for signs of damage, such as loose connections or attachments. Your knee brace may get damaged or wear out during normal use.  Wash the fabric parts of your brace with soap and water.  Read the insert that comes with your brace for other specific care instructions. Contact a health care provider if:  Your knee brace is too loose or too tight and you cannot adjust it.  Your knee brace causes skin redness, swelling, bruising, or irritation.  Your knee brace is not helping.  Your knee brace is making your knee pain worse. This information is not intended to replace advice given to you by your health care provider. Make sure you discuss any questions you have with your health care provider. Document Released: 08/19/2003 Document Revised: 11/04/2015 Document Reviewed: 09/21/2014 Elsevier Interactive Patient Education  [employer].
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
## 2017-10-23 DIAGNOSIS — R195 Other fecal abnormalities: Secondary | ICD-10-CM | POA: Diagnosis not present

## 2017-10-25 DIAGNOSIS — F4310 Post-traumatic stress disorder, unspecified: Secondary | ICD-10-CM | POA: Diagnosis not present

## 2017-10-25 DIAGNOSIS — F3341 Major depressive disorder, recurrent, in partial remission: Secondary | ICD-10-CM | POA: Diagnosis not present

## 2017-10-28 LAB — PANCREATIC ELASTASE, FECAL: Pancreatic Elastase, Fecal: >500 ug Elast./g (ref >200)

## 2017-11-01 DIAGNOSIS — M542 Cervicalgia: Secondary | ICD-10-CM | POA: Diagnosis not present

## 2017-11-06 DIAGNOSIS — H2513 Age-related nuclear cataract, bilateral: Secondary | ICD-10-CM | POA: Diagnosis not present

## 2017-11-06 DIAGNOSIS — H524 Presbyopia: Secondary | ICD-10-CM | POA: Diagnosis not present

## 2017-11-24 ENCOUNTER — Other Ambulatory Visit: Payer: Self-pay

## 2017-11-24 ENCOUNTER — Ambulatory Visit (INDEPENDENT_AMBULATORY_CARE_PROVIDER_SITE_OTHER): Payer: Medicare Other | Admitting: Physician Assistant

## 2017-11-24 ENCOUNTER — Encounter: Payer: Self-pay | Admitting: Physician Assistant

## 2017-11-24 VITALS — BP 118/70 | HR 79 | Temp 98.0°F | Resp 16 | Ht 64.96 in | Wt 292.0 lb

## 2017-11-24 DIAGNOSIS — M1712 Unilateral primary osteoarthritis, left knee: Secondary | ICD-10-CM

## 2017-11-24 MED ORDER — PREDNISONE 20 MG PO TABS: ORAL_TABLET | ORAL | 0 refills | Status: AC

## 2017-11-24 NOTE — Progress Notes (Signed)
11/24/2017 8:54 AM   DOB: 05/04/1961 / MRN: [id number]  SUBJECTIVE:  Valerie [name] is a 57 y.o. female presenting for Worsening left knee pain.  Describes the pain as an ache in the burn and worse with movement.  Patient denies fever, chills.  She was seen recently for same and was found to have osteoarthritis in the joint with interval worsening.  She does not take NSAIDs secondary to renal insufficiency.  Tylenol does not really help.  She does have chronic pain meds which has been helpful.  She is requesting a round of prednisone today.  Most recent A1c shows borderline diabetes  She is allergic to dilaudid [hydromorphone hcl]; effexor [venlafaxine]; pregabalin; metoclopramide hcl; neurontin [gabapentin]; other; and serotonin reuptake inhibitors (ssris).   She  has a past medical history of Acute ischemic colitis ([place]), Anemia (05/01/2012), Anxiety, ARF (acute renal failure) ([place]) (12/28/2015), Arthritis, Asthma, Basal cell carcinoma of upper back (~ 2011), Chronic right-sided headaches, DDD (degenerative disc disease), lumbar, Diarrhea, Fatigue, Fibromyalgia, First degree heart block, GERD (gastroesophageal reflux disease) (04/06/2017), Headache(784.0), History of ischemic colitis (09/24/2016), Hypertension, Mental disorder, Microscopic polyangiitis ([place]), Nerve pain (2011), Pancreatitis, Pneumonia (~ 2014; 08/2015), Polymyalgia rheumatica ([place]) (onset 08/2015), Precancerous melanosis ([place]), and Sleep apnea.    She  reports that she has never smoked. She has never used smokeless tobacco. She reports that she does not drink alcohol or use drugs. She  reports that she does not currently engage in sexual activity. The patient  has a past surgical history that includes Shoulder arthroscopy with rotator cuff repair and open biceps tenodesis (Bilateral, 2010-2013); Nasal septoplasty w/ turbinoplasty (1998); Esophagogastroduodenoscopy (12/21/2011); Flexible sigmoidoscopy (05/01/2012); Excision basal cell  carcinoma (~ 2011); Esophagogastroduodenoscopy (N/A, 11/06/2012); Botox injection (N/A, 11/06/2012); Esophagogastroduodenoscopy (N/A, 12/04/2013); Botox injection (N/A, 12/04/2013); Esophagogastroduodenoscopy (N/A, 02/26/2014); Botox injection (N/A, 02/26/2014); Esophagogastroduodenoscopy (egd) with propofol (N/A, 07/08/2015); Botox injection (N/A, 07/08/2015); Laparoscopic cholecystectomy (01/2002); Esophagogastroduodenoscopy (egd) with propofol (N/A, 04/27/2016); Botox injection (N/A, 04/27/2016); Flexible sigmoidoscopy (Left, 09/28/2016); Esophagogastroduodenoscopy (N/A, 04/09/2017); and Botox injection (N/A, 04/09/2017).  Her family history includes Arthritis in her mother and paternal grandmother; COPD in her mother; Cancer in her mother; Heart disease in her mother, paternal grandfather, and paternal grandmother; Hypertension in her mother.  Review of Systems  Constitutional: Negative for diaphoresis.  Eyes: Negative.   Respiratory: Negative for cough, hemoptysis, sputum production, shortness of breath and wheezing.   Cardiovascular: Negative for chest pain, orthopnea and leg swelling.  Gastrointestinal: Negative for nausea.  Neurological: Negative for dizziness, sensory change, speech change, focal weakness and headaches.    The problem list and medications were reviewed and updated by myself where necessary and exist elsewhere in the encounter.   OBJECTIVE:  BP 118/70   Pulse 79   Temp 98 F (36.7 C) (Oral)   Resp 16   Ht 5' 4.96" (1.65 m)   Wt 292 lb (132.5 kg)   SpO2 99%   BMI 48.65 kg/m    Physical Exam  Constitutional: She is oriented to person, place, and time. She appears well-nourished. No distress.  Eyes: Pupils are equal, round, and reactive to light. EOM are normal.  Cardiovascular: Normal rate.  Pulmonary/Chest: Effort normal.  Abdominal: She exhibits no distension.  Musculoskeletal:       Right knee: Normal.       Left knee: She exhibits bony tenderness. She exhibits  normal range of motion, no swelling, no effusion, no ecchymosis, no deformity, no laceration, no erythema, normal alignment and normal patellar  mobility. Tenderness found. Medial joint line, lateral joint line, MCL and LCL tenderness noted. No patellar tendon tenderness noted.  Neurological: She is alert and oriented to person, place, and time. No cranial nerve deficit. Gait normal.  Skin: Skin is dry. She is not diaphoretic.  Psychiatric: She has a normal mood and affect.  Vitals reviewed.   No results found for this or any previous visit (from the past 72 hour(s)).  No results found.  ASSESSMENT AND PLAN:  Valerie [name] was seen today for knee pain.  Diagnoses and all orders for this visit:  Primary osteoarthritis of left knee -     predniSONE (DELTASONE) 20 MG tablet; Take 3 in the morning for 3 days, then 2 in the morning for 3 days, and then 1 in the morning for 3 days.    The patient is advised to call or return to clinic if she does not see an improvement in symptoms, or to seek the care of the closest emergency department if she worsens with the above plan.   Philis Fendt, MHS, PA-C Primary Care at [facility] 11/24/2017 8:54 AM

## 2017-11-24 NOTE — Patient Instructions (Addendum)
Go ahead and schedule a follow-up appointment with me in 3 weeks.  If you are significantly better please cancel that appointment within 3 to 4 days.  If you are not better I can inject the knee with a steroid.  Hopefully we get the pain under control with by mouth prednisone.  Always good to see [name].    IF you received an x-ray today, you will receive an invoice from [facility]. Please contact [facility] at [phone] with questions or concerns regarding your invoice.   IF you received labwork today, you will receive an invoice from [facility]. Please contact [facility] at [phone] with questions or concerns regarding your invoice.   Our billing staff will not be able to assist you with questions regarding bills from these companies.  You will be contacted with the lab results as soon as they are available. The fastest way to get your results is to activate your My Chart account. Instructions are located on the last page of this paperwork. If you have not heard from [place] regarding the results in 2 weeks, please contact this office.

## 2017-11-25 ENCOUNTER — Other Ambulatory Visit: Payer: Self-pay | Admitting: Family Medicine

## 2017-11-25 DIAGNOSIS — Z9109 Other allergy status, other than to drugs and biological substances: Secondary | ICD-10-CM

## 2017-11-29 DIAGNOSIS — F4310 Post-traumatic stress disorder, unspecified: Secondary | ICD-10-CM | POA: Diagnosis not present

## 2017-12-03 ENCOUNTER — Other Ambulatory Visit: Payer: Self-pay

## 2017-12-03 ENCOUNTER — Encounter: Payer: Self-pay | Admitting: Family Medicine

## 2017-12-03 ENCOUNTER — Ambulatory Visit (INDEPENDENT_AMBULATORY_CARE_PROVIDER_SITE_OTHER): Payer: Medicare Other | Admitting: Family Medicine

## 2017-12-03 VITALS — BP 121/77 | HR 64 | Temp 98.3°F | Resp 17 | Ht 64.96 in | Wt 297.0 lb

## 2017-12-03 DIAGNOSIS — R7302 Impaired glucose tolerance (oral): Secondary | ICD-10-CM

## 2017-12-03 DIAGNOSIS — I10 Essential (primary) hypertension: Secondary | ICD-10-CM

## 2017-12-03 DIAGNOSIS — E790 Hyperuricemia without signs of inflammatory arthritis and tophaceous disease: Secondary | ICD-10-CM | POA: Diagnosis not present

## 2017-12-03 DIAGNOSIS — N183 Chronic kidney disease, stage 3 unspecified: Secondary | ICD-10-CM

## 2017-12-03 LAB — POCT GLYCOSYLATED HEMOGLOBIN (HGB A1C): HEMOGLOBIN A1C: 6.7 % — AB (ref 4.0–5.6)

## 2017-12-03 LAB — POCT URINALYSIS DIP (MANUAL ENTRY)
Bilirubin, UA: NEGATIVE
Blood, UA: NEGATIVE
Glucose, UA: NEGATIVE mg/dL
Ketones, POC UA: NEGATIVE mg/dL
Leukocytes, UA: NEGATIVE
Nitrite, UA: NEGATIVE
PH UA: 6 (ref 5.0–8.0)
PROTEIN UA: NEGATIVE mg/dL
Spec Grav, UA: 1.02 (ref 1.010–1.025)
Urobilinogen, UA: 0.2 E.U./dL

## 2017-12-03 NOTE — Patient Instructions (Addendum)
IF you received an x-ray today, you will receive an invoice from [facility]. Please contact [facility] at [phone] with questions or concerns regarding your invoice.   IF you received labwork today, you will receive an invoice from [facility]. Please contact [facility] at [phone] with questions or concerns regarding your invoice.   Our billing staff will not be able to assist you with questions regarding bills from these companies.  You will be contacted with the lab results as soon as they are available. The fastest way to get your results is to activate your My Chart account. Instructions are located on the last page of this paperwork. If you have not heard from [place] regarding the results in 2 weeks, please contact this office.      Edema Edema is an abnormal buildup of fluids in your bodytissues. Edema is somewhatdependent on gravity to pull the fluid to the lowest place in your body. That makes the condition more common in the legs and thighs (lower extremities). Painless swelling of the feet and ankles is common and becomes more likely as you get older. It is also common in looser tissues, like around your eyes. When the affected area is squeezed, the fluid may move out of that spot and leave a dent for a few moments. This dent is called pitting. What are the causes? There are many possible causes of edema. Eating too much salt and being on your feet or sitting for a long time can cause edema in your legs and ankles. Hot weather may make edema worse. Common medical causes of edema include:  Heart failure.  Liver disease.  Kidney disease.  Weak blood vessels in your legs.  Cancer.  An injury.  Pregnancy.  Some medications.  Obesity.  What are the signs or symptoms? Edema is usually painless.Your skin may look swollen or shiny. How is this diagnosed? Your health care provider may be able to diagnose edema by asking about your medical history  and doing a physical exam. You may need to have tests such as X-rays, an electrocardiogram, or blood tests to check for medical conditions that may cause edema. How is this treated? Edema treatment depends on the cause. If you have heart, liver, or kidney disease, you need the treatment appropriate for these conditions. General treatment may include:  Elevation of the affected body part above the level of your heart.  Compression of the affected body part. Pressure from elastic bandages or support stockings squeezes the tissues and forces fluid back into the blood vessels. This keeps fluid from entering the tissues.  Restriction of fluid and salt intake.  Use of a water pill (diuretic). These medications are appropriate only for some types of edema. They pull fluid out of your body and make you urinate more often. This gets rid of fluid and reduces swelling, but diuretics can have side effects. Only use diuretics as directed by your health care provider.  Follow these instructions at home:  Keep the affected body part above the level of your heart when you are lying down.  Do not sit still or stand for prolonged periods.  Do not put anything directly under your knees when lying down.  Do not wear constricting clothing or garters on your upper legs.  Exercise your legs to work the fluid back into your blood vessels. This may help the swelling go down.  Wear elastic bandages or support stockings to reduce ankle swelling as directed by your health  care provider.  Eat a low-salt diet to reduce fluid if your health care provider recommends it.  Only take medicines as directed by your health care provider. Contact a health care provider if:  Your edema is not responding to treatment.  You have heart, liver, or kidney disease and notice symptoms of edema.  You have edema in your legs that does not improve after elevating them.  You have sudden and unexplained weight gain. Get help  right away if:  You develop shortness of breath or chest pain.  You cannot breathe when you lie down.  You develop pain, redness, or warmth in the swollen areas.  You have heart, liver, or kidney disease and suddenly get edema.  You have a fever and your symptoms suddenly get worse. This information is not intended to replace advice given to you by your health care provider. Make sure you discuss any questions you have with your health care provider. Document Released: 05/29/2005 Document Revised: 11/04/2015 Document Reviewed: 03/21/2013 Elsevier Interactive Patient Education  2017 [employer]  Chronic Kidney Disease, Adult Chronic kidney disease (CKD) occurs when the kidneys become damaged slowly over a long period of time. The kidneys are a pair of organs that do many important jobs in the body, including:  Removing waste and extra fluid from the blood to make urine.  Making hormones that maintain the amount of fluid in tissues and blood vessels.  Maintaining the right amount of fluids and chemicals in the body.  A small amount of kidney damage may not cause problems, but a large amount of damage may make it hard or impossible for the kidneys to work the way they should. If steps are not taken to slow down kidney damage or to stop it from getting worse, the kidneys may stop working permanently (end-stage renal disease or ESRD). Most of the time, CKD does not go away, but it can often be controlled. People who have CKD are usually able to live normal lives. What are the causes? The most common causes of this condition are diabetes and high blood pressure (hypertension). Other causes include:  Heart and blood vessel (cardiovascular) disease.  Kidney diseases, such as: ? Glomerulonephritis. ? Interstitial nephritis. ? Polycystic kidney disease. ? Renal vascular disease.  Diseases that affect the immune system.  Genetic diseases.  Medicines that damage the kidneys, such as  anti-inflammatory medicines.  Being around or being in contact with poisonous (toxic) substances.  A kidney or urinary infection that occurs again and again (recurs).  Vasculitis. This is swelling or inflammation of the blood vessels.  A problem with urine flow that may be caused by: ? Cancer. ? Having kidney stones more than one time. ? An enlarged prostate, in males.  What increases the risk? You are more likely to develop this condition if you:  Are older than age 78.  Are female.  Are African-American, Hispanic, Asian, [place], or American [place].  Are a current or former smoker.  Are obese.  Have a family history of kidney disease or failure.  Often take medicines that are damaging to the kidneys.  What are the signs or symptoms? Symptoms of this condition include:  Swelling (edema) of the face, legs, ankles, or feet.  Tiredness (lethargy) and having less energy.  Nausea or vomiting.  Confusion or trouble concentrating.  Problems with urination, such as: ? Painful or burning feeling during urination. ? Decreased urine production. ? Frequent urination, especially at night. ? Bloody urine.  Muscle  twitches and cramps, especially in the legs.  Shortness of breath.  Weakness.  Loss of appetite.  Metallic taste in the mouth.  Trouble sleeping.  Dry, itchy skin.  A low blood count (anemia).  Pale lining of the eyelids and surface of the eye (conjunctiva).  Symptoms develop slowly and may not be obvious until the kidney damage becomes severe. It is possible to have kidney disease for years without having any symptoms. How is this diagnosed? This condition may be diagnosed based on:  Blood tests.  Urine tests.  Imaging tests, such as an ultrasound or CT scan.  A test in which a sample of tissue is removed from the kidneys to be examined under a microscope (kidney biopsy).  These test results will help your health care provider  determine how serious the CKD is. How is this treated? There is no cure for most cases of this condition, but treatment usually relieves symptoms and prevents or slows the progression of the disease. Treatment may include:  Making diet changes, which may require you to avoid alcohol, salty foods (sodium), and foods that are high in potassium, calcium, and protein.  Medicines: ? To lower blood pressure. ? To control blood glucose. ? To relieve anemia. ? To relieve swelling. ? To protect your bones. ? To improve the balance of electrolytes in your blood.  Removing toxic waste from the body through types of dialysis, if the kidneys can no longer do their job (kidney failure).  Managing any other conditions that are causing your CKD or making it worse.  Follow these instructions at home: Medicines  Take over-the-counter and prescription medicines only as told by your health care provider. The dose of some medicines that you take may need to be adjusted.  Do not take any new medicines unless approved by your health care provider. Many medicines can worsen your kidney damage.  Do not take any vitamin and mineral supplements unless approved by your health care provider. Many nutritional supplements can worsen your kidney damage. General instructions  Follow your prescribed diet as told by your health care provider.  Do not use any products that contain nicotine or tobacco, such as cigarettes and e-cigarettes. If you need help quitting, ask your health care provider.  Monitor and track your blood pressure at home. Report changes in your blood pressure as told by your health care provider.  If you are being treated for diabetes, monitor and track your blood sugar (blood glucose) levels as told by your health care provider.  Maintain a healthy weight. If you need help with this, ask your health care provider.  Start or continue an exercise plan. Exercise at least 30 minutes a day, 5 days  a week.  Keep your immunizations up to date as told by your health care provider.  Keep all follow-up visits as told by your health care provider. This is important. Where to find more information:  American Association of Kidney Patients: BombTimer.gl  National Kidney Foundation: www.kidney.[place]: https://mathis.com/  [facility]: www.lifeoptions.org and www.kidneyschool.org Contact a health care provider if:  Your symptoms get worse.  You develop new symptoms. Get help right away if:  You develop symptoms of ESRD, which include: ? Headaches. ? Numbness in the hands or feet. ? Easy bruising. ? Frequent hiccups. ? Chest pain. ? Shortness of breath. ? Lack of menstruation, in women.  You have a fever.  You have decreased urine production.  You have pain or  bleeding when you urinate. Summary  Chronic kidney disease (CKD) occurs when the kidneys become damaged slowly over a long period of time.  The most common causes of this condition are diabetes and high blood pressure (hypertension).  There is no cure for most cases of this condition, but treatment usually relieves symptoms and prevents or slows the progression of the disease. Treatment may include a combination of medicines and lifestyle changes. This information is not intended to replace advice given to you by your health care provider. Make sure you discuss any questions you have with your health care provider. Document Released: 03/07/2008 Document Revised: 07/06/2016 Document Reviewed: 07/06/2016 Elsevier Interactive Patient Education  [employer].

## 2017-12-03 NOTE — Progress Notes (Signed)
Subjective:  By signing my name below, I, Moises Blood, attest that this documentation has been prepared under the direction and in the presence of Delman Cheadle, MD. Electronically Signed: Moises Blood, [place]. 12/03/2017 , 9:40 AM .  Patient was seen in Room 2 .   Patient ID: Valerie [name], female    DOB: 08/04/1960, 57 y.o.   MRN: [id number] Chief Complaint  Patient presents with  . renal function    6 week f/u   HPI Valerie [name] is a 57 y.o. female who presents to Primary Care at [facility] for follow up. She isn't fasting today. She states her general swelling has reduced, but has been sweating more. She tried taking lower dose of torsemide half tablet every other day, but she had lower leg pain with ankle swelling after lowering doses, so went back to doing 1 tablet every other day. She was drinking plenty of fluids throughout the day yesterday, but didn't urinate much during the day; however, she kept going to the bathroom at night. She's been taking Vitamin D 5000 units. She finished her prednisone; was taking it as she was having pain around her knees. She denies gout flare, no redness or warmth. 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 She takes Dexilant bid for heartburn.   She mentions seeing psychiatrist at [facility] last week; was going to start medication but will need to watch kidney and liver. Patient declined medication because she didn't want to throw in another wrench. She has another appointment next month.   Past Medical History:  Diagnosis Date  . Acute ischemic colitis ([place])   . Anemia 05/01/2012  . Anxiety   . ARF (acute renal failure) ([place]) 12/28/2015  . Arthritis    "basically ~ q joint" (12/28/2015)  . Asthma   . Basal cell carcinoma of upper back ~ 2011  . Chronic right-sided headaches    and neck pain, relieved wiht PT and chronic practic manipulation  . DDD (degenerative disc disease), lumbar   . Diarrhea    EPISODIC    . Fatigue    h/o excessive sedation, etiology unknown  . Fibromyalgia   . First degree heart block   . GERD (gastroesophageal reflux disease) 04/06/2017  . Headache(784.0)    "muscular w/migraine intensity; ~ 5-6 months" (12/28/2015)  . History of ischemic colitis 09/24/2016  . Hypertension   . Mental disorder    reports PTSD  . Microscopic polyangiitis ([place])   . Nerve pain 2011   nerve damage to right arm; normal EMG/NCV to RUExt after shoulder surgery 12/2009  . Pancreatitis    due to Effexor  . Pneumonia ~ 2014; 08/2015   Aspiration  . Polymyalgia rheumatica ([place]) onset 08/2015   changed diagnosis to MPA  . Precancerous melanosis ([place])    "2nd toe left foot; 5th toe of right foot; right temple; had them all cut off"  . Sleep apnea    CPAP-9   Past Surgical History:  Procedure Laterality Date  . BASAL CELL CARCINOMA EXCISION  ~ 2011   back  . BOTOX INJECTION N/A 11/06/2012   Procedure: BOTOX INJECTION;  Surgeon: Missy Sabins, MD;  Location: [facility];  Service: Endoscopy;  Laterality: N/A;  . BOTOX INJECTION N/A 12/04/2013   Procedure: BOTOX INJECTION;  Surgeon: Missy Sabins, MD;  Location: [place];  Service: Endoscopy;  Laterality: N/A;  . BOTOX INJECTION N/A 02/26/2014   Procedure: BOTOX  INJECTION;  Surgeon: Missy Sabins, MD;  Location: [facility] ENDOSCOPY;  Service: Endoscopy;  Laterality: N/A;  . BOTOX INJECTION N/A 07/08/2015   Procedure: BOTOX INJECTION;  Surgeon: Teena Irani, MD;  Location: [facility];  Service: Endoscopy;  Laterality: N/A;  . BOTOX INJECTION N/A 04/27/2016   Procedure: BOTOX INJECTION;  Surgeon: Teena Irani, MD;  Location: [facility];  Service: Endoscopy;  Laterality: N/A;  . BOTOX INJECTION N/A 04/09/2017   Procedure: BOTOX INJECTION;  Surgeon: Clarene Essex, MD;  Location: [place];  Service: Endoscopy;  Laterality: N/A;  . ESOPHAGOGASTRODUODENOSCOPY  12/21/2011   Procedure: ESOPHAGOGASTRODUODENOSCOPY (EGD);  Surgeon: Missy Sabins, MD;  Location: [facility]  ENDOSCOPY;  Service: Endoscopy;  Laterality: N/A;  . ESOPHAGOGASTRODUODENOSCOPY N/A 11/06/2012   Procedure: ESOPHAGOGASTRODUODENOSCOPY (EGD);  Surgeon: Missy Sabins, MD;  Location: [facility];  Service: Endoscopy;  Laterality: N/A;  . ESOPHAGOGASTRODUODENOSCOPY N/A 12/04/2013   Procedure: ESOPHAGOGASTRODUODENOSCOPY (EGD);  Surgeon: Missy Sabins, MD;  Location: [facility] ENDOSCOPY;  Service: Endoscopy;  Laterality: N/A;  . ESOPHAGOGASTRODUODENOSCOPY N/A 02/26/2014   Procedure: ESOPHAGOGASTRODUODENOSCOPY (EGD);  Surgeon: Missy Sabins, MD;  Location: [facility] ENDOSCOPY;  Service: Endoscopy;  Laterality: N/A;  . ESOPHAGOGASTRODUODENOSCOPY N/A 04/09/2017   Procedure: ESOPHAGOGASTRODUODENOSCOPY (EGD);  Surgeon: Clarene Essex, MD;  Location: [place];  Service: Endoscopy;  Laterality: N/A;  . ESOPHAGOGASTRODUODENOSCOPY (EGD) WITH PROPOFOL N/A 07/08/2015   Procedure: ESOPHAGOGASTRODUODENOSCOPY (EGD) WITH PROPOFOL;  Surgeon: Teena Irani, MD;  [facility];  Service: Endoscopy;  Laterality: N/A;  . ESOPHAGOGASTRODUODENOSCOPY (EGD) WITH PROPOFOL N/A 04/27/2016   Procedure: ESOPHAGOGASTRODUODENOSCOPY (EGD) WITH PROPOFOL;  Surgeon: Teena Irani, MD;  [facility];  Service: Endoscopy;  Laterality: N/A;  . FLEXIBLE SIGMOIDOSCOPY  05/01/2012   Procedure: FLEXIBLE SIGMOIDOSCOPY;  Surgeon: Jeryl Columbia, MD;  Location: [facility];  Service: Endoscopy;  Laterality: N/A;  unprepped  . FLEXIBLE SIGMOIDOSCOPY Left 09/28/2016   Procedure: FLEXIBLE SIGMOIDOSCOPY;  Surgeon: Wilford Corner, MD;  Location: [facility];  Service: Endoscopy;  Laterality: Left;  . LAPAROSCOPIC CHOLECYSTECTOMY  01/2002  . NASAL SEPTOPLASTY W/ TURBINOPLASTY  1998  . SHOULDER ARTHROSCOPY WITH ROTATOR CUFF REPAIR AND OPEN BICEPS TENODESIS Bilateral 2010-2013   left-right   Prior to Admission medications   Medication Sig Start Date End Date Taking? Authorizing Provider  albuterol (PROAIR HFA) 108 (90 Base) MCG/ACT inhaler Inhale 2 puffs  every 4 (four) hours as needed into the lungs for wheezing or shortness of breath. 04/23/17  Yes Shawnee Knapp, MD  allopurinol (ZYLOPRIM) 300 MG tablet TAKE 1/2 TABLET BY MOUTH ONCE A DAY 08/15/17  Yes Shawnee Knapp, MD  ALPRAZolam Duanne Moron) 1 MG tablet Take 1 tablet (1 mg total) by mouth 3 (three) times daily as needed for anxiety. 09/20/17  Yes Shawnee Knapp, MD  baclofen (LIORESAL) 10 MG tablet Take 10 mg by mouth 3 (three) times daily. Takes as needed   Yes [provider]  benazepril (LOTENSIN) 20 MG tablet Take 1 tablet (20 mg total) daily by mouth. 04/23/17  Yes Shawnee Knapp, MD  cetirizine (ZYRTEC) 10 MG tablet Take 10 mg by mouth daily. Alternates between Allegra and Zyrtec as needed for allergies   Yes [provider]  Cholecalciferol (VITAMIN D3) 5000 units CAPS Take 1 capsule by mouth.   Yes [provider]  dexlansoprazole (DEXILANT) 60 MG capsule Take 60 mg by mouth 2 (two) times daily.   Yes [provider]  fexofenadine (ALLEGRA) 180 MG tablet Take 180 mg by mouth daily as needed. Switches  between [place] and Zyrtec as needed for allergies   Yes [provider]  fluticasone (FLONASE) 50 MCG/ACT nasal spray PLACE 2 SPRAYS INTO BOTH NOSTRILS DAILY 10/04/17  Yes Shawnee Knapp, MD  fluticasone [facility]) 50 MCG/ACT nasal spray PLACE 2 SPRAYS INTO BOTH NOSTRILS DAILY 11/28/17  Yes Shawnee Knapp, MD  HYDROcodone-acetaminophen (NORCO) 10-325 MG per tablet Take 1-2 tablets by mouth every 6 (six) hours as needed for moderate pain (pain.).    Yes [provider]  hydrOXYzine (ATARAX/VISTARIL) 10 MG tablet Take 1-3 tablets (10-30 mg total) by mouth 3 (three) times daily as needed. 10/19/17  Yes Shawnee Knapp, MD  ipratropium (ATROVENT) 0.03 % nasal spray Place 2 sprays 4 (four) times daily into the nose. 04/23/17  Yes Shawnee Knapp, MD  metoprolol succinate (TOPROL-XL) 100 MG 24 hr tablet Take 1 tablet (100 mg total) by mouth daily. Take with or immediately following  a meal. 07/09/17  Yes Shawnee Knapp, MD  modafinil (PROVIGIL) 200 MG tablet Take 0.5 tablets (100 mg total) by mouth 2 (two) times daily. 10/11/17  Yes Dohmeier, Asencion Partridge, MD  montelukast (SINGULAIR) 10 MG tablet TAKE ONE TABLET BY MOUTH EVERY NIGHT AT BEDTIME 08/15/17  Yes Shawnee Knapp, MD  morphine (MS CONTIN) 30 MG 12 hr tablet Take 30 mg by mouth every 8 (eight) hours. Not from [place] 09/23/12  Yes [provider]  Multiple Vitamins-Minerals (MULTIVITAMIN PO) Take 1 tablet by mouth daily.   Yes [provider]  Olopatadine HCl (PAZEO) 0.7 % SOLN Apply 1 drop to eye daily. 07/09/17  Yes Shawnee Knapp, MD  ondansetron (ZOFRAN) 4 MG tablet Take 1 tablet (4 mg total) by mouth every 8 (eight) hours as needed for nausea or vomiting. 11/18/16  Yes Tereasa Coop, PA-C  Pancrelipase, Lip-Prot-Amyl, (CREON) 24000 UNITS CPEP Take 72,000 Units by mouth 3 (three) times daily as needed (for pancreatitis).    Yes [provider]  predniSONE (DELTASONE) 20 MG tablet Take 3 in the morning for 3 days, then 2 in the morning for 3 days, and then 1 in the morning for 3 days. 11/24/17 12/03/17 Yes Tereasa Coop, PA-C  promethazine (PHENERGAN) 25 MG tablet Take 1 tablet (25 mg total) by mouth every 6 (six) hours as needed for nausea or vomiting. 11/18/16  Yes Tereasa Coop, PA-C  spironolactone (ALDACTONE) 100 MG tablet Take 1 tablet (100 mg total) by mouth daily. 07/09/17  Yes Shawnee Knapp, MD  sucralfate (CARAFATE) 1 GM/10ML suspension Take 10 mLs (1 g total) by mouth 4 (four) times daily as needed (stomach pain). 09/20/17  Yes Shawnee Knapp, MD  torsemide (DEMADEX) 10 MG tablet TAKE ONE TABLET BY MOUTH DAILY (DISCONTINUE USE OF FUROSEMIDE) 11/28/17  Yes Shawnee Knapp, MD  calcium carbonate (OSCAL) 1500 (600 Ca) MG TABS tablet Take 600 mg of elemental calcium by mouth daily with breakfast.    [provider]   Allergies  Allergen Reactions  . Dilaudid [Hydromorphone Hcl] Other (See Comments)     Headache   . Effexor [Venlafaxine]     pancreatitis  . Pregabalin Other (See Comments)    Drug induced stupor  . Metoclopramide Hcl Other (See Comments)    "crawling inside"  . Neurontin [Gabapentin] Other (See Comments)    Tremors   . Other Other (See Comments)    Avoid all SSRI's and SNRI's- damaged pancreas  . Serotonin Reuptake Inhibitors (Ssris) Other (See Comments)  Damage to pancreas and bowel/urinary incontinence    Family History  Problem Relation Age of Onset  . Heart disease Mother   . COPD Mother   . Arthritis Mother   . Cancer Mother   . Hypertension Mother   . Arthritis Paternal Grandmother   . Heart disease Paternal Grandmother   . Heart disease Paternal Grandfather    Social History   Socioeconomic History  . Marital status: Single    Spouse name: Not on file  . Number of children: 0  . Years of education: College  . Highest education level: Not on file  Occupational History  . Occupation: disabled  Social Needs  . Financial resource strain: Not on file  . Food insecurity:    Worry: Not on file    Inability: Not on file  . Transportation needs:    Medical: Not on file    Non-medical: Not on file  Tobacco Use  . Smoking status: Never Smoker  . Smokeless tobacco: Never Used  Substance and Sexual Activity  . Alcohol use: No  . Drug use: No  . Sexual activity: Not Currently  Lifestyle  . Physical activity:    Days per week: Not on file    Minutes per session: Not on file  . Stress: Not on file  Relationships  . Social connections:    Talks on phone: Not on file    Gets together: Not on file    Attends religious service: Not on file    Active member of club or organization: Not on file    Attends meetings of clubs or organizations: Not on file    Relationship status: Not on file  Other Topics Concern  . Not on file  Social History Narrative   Caffeine 2 cups iced tea daily avg.   Depression screen [facility] 2/9 12/03/2017 11/24/2017 10/19/2017  09/28/2017 09/20/2017  Decreased Interest 0 0 0 0 0  Down, Depressed, Hopeless 0 0 0 0 0  PHQ - 2 Score 0 0 0 0 0  Altered sleeping - - - - -  Tired, decreased energy - - - - -  Change in appetite - - - - -  Feeling bad or failure about yourself  - - - - -  Trouble concentrating - - - - -  Moving slowly or fidgety/restless - - - - -  Suicidal thoughts - - - - -  PHQ-9 Score - - - - -  Difficult doing work/chores - - - - -  Some recent data might be hidden    Review of Systems  Constitutional: Negative for chills, fatigue, fever and unexpected weight change.  Respiratory: Negative for cough.   Gastrointestinal: Negative for constipation, diarrhea, nausea and vomiting.  Skin: Negative for rash and wound.  Neurological: Negative for dizziness, weakness and headaches.       Objective:   Physical Exam  Constitutional: She is oriented to person, place, and time. She appears well-developed and well-nourished. No distress.  HENT:  Head: Normocephalic and atraumatic.  Eyes: Pupils are equal, round, and reactive to light. EOM are normal.  Neck: Neck supple.  Cardiovascular: Normal rate.  Pulmonary/Chest: Effort normal. No respiratory distress.  Musculoskeletal: Normal range of motion.  Neurological: She is alert and oriented to person, place, and time.  Skin: Skin is warm and dry.  Psychiatric: She has a normal mood and affect. Her behavior is normal.  Nursing note and vitals reviewed.   BP 121/77 (BP Location: Left Arm,  Patient Position: Sitting, Cuff Size: Normal)   Pulse 64   Temp 98.3 F (36.8 C) (Oral)   Resp 17   Ht 5' 4.96" (1.65 m)   Wt 297 lb (134.7 kg)   SpO2 95%   BMI 49.48 kg/m      Assessment & Plan:  Sed rate last checked 2 months prior, was normal. Thyroid 1 year prior was normal. B-12 2 years prior was high end of normal. If BUN >25, creatinine >1.3, then will switch back to torsemide.   1. CKD (chronic kidney disease) stage 3, GFR 30-59 ml/min (HCC)   2.  Impaired glucose tolerance   3. Essential hypertension   4. Elevated uric acid in blood     Orders Placed This Encounter  Procedures  . Comprehensive metabolic panel  . VITAMIN D 25 Hydroxy (Vit-D Deficiency, Fractures)  . Uric acid  . Comprehensive metabolic panel  . Care order/instruction:    Scheduling Instructions:     Recheck BP  . POCT urinalysis dipstick  . POCT glycosylated hemoglobin (Hb A1C)     I personally performed the services described in this documentation, which was scribed in my presence. The recorded information has been reviewed and considered, and addended by me as needed.   Delman Cheadle, M.D.  Primary Care at [facility] [address] [place], [place] [address] [phone] phone [phone] fax  12/29/17 8:55 AM

## 2017-12-04 LAB — COMPREHENSIVE METABOLIC PANEL
ALK PHOS: 64 IU/L (ref 39–117)
ALT: 14 IU/L (ref 0–32)
AST: 13 IU/L (ref 0–40)
Albumin/Globulin Ratio: 1.5 (ref 1.2–2.2)
Albumin: 4.3 g/dL (ref 3.5–5.5)
BUN/Creatinine Ratio: 28 — ABNORMAL HIGH (ref 9–23)
BUN: 37 mg/dL — AB (ref 6–24)
CHLORIDE: 99 mmol/L (ref 96–106)
CO2: 22 mmol/L (ref 20–29)
CREATININE: 1.33 mg/dL — AB (ref 0.57–1.00)
Calcium: 9.6 mg/dL (ref 8.7–10.2)
GFR calc Af Amer: 52 mL/min/{1.73_m2} — ABNORMAL LOW (ref >59)
GFR calc non Af Amer: 45 mL/min/{1.73_m2} — ABNORMAL LOW (ref >59)
GLUCOSE: 119 mg/dL — AB (ref 65–99)
Globulin, Total: 2.8 g/dL (ref 1.5–4.5)
Potassium: 4.2 mmol/L (ref 3.5–5.2)
Sodium: 135 mmol/L (ref 134–144)
Total Protein: 7.1 g/dL (ref 6.0–8.5)

## 2017-12-04 LAB — VITAMIN D 25 HYDROXY (VIT D DEFICIENCY, FRACTURES): Vit D, 25-Hydroxy: 45 ng/mL (ref 30.0–100.0)

## 2017-12-04 LAB — URIC ACID: Uric Acid: 6.9 mg/dL (ref 2.5–7.1)

## 2017-12-06 ENCOUNTER — Encounter: Payer: Self-pay | Admitting: Family Medicine

## 2017-12-15 ENCOUNTER — Ambulatory Visit (INDEPENDENT_AMBULATORY_CARE_PROVIDER_SITE_OTHER): Payer: Medicare Other | Admitting: Physician Assistant

## 2017-12-15 ENCOUNTER — Encounter: Payer: Self-pay | Admitting: Physician Assistant

## 2017-12-15 ENCOUNTER — Other Ambulatory Visit: Payer: Self-pay

## 2017-12-15 VITALS — BP 128/80 | HR 82 | Temp 98.6°F | Resp 18 | Ht 64.96 in | Wt 295.2 lb

## 2017-12-15 DIAGNOSIS — G8929 Other chronic pain: Secondary | ICD-10-CM | POA: Diagnosis not present

## 2017-12-15 DIAGNOSIS — M25562 Pain in left knee: Secondary | ICD-10-CM

## 2017-12-15 NOTE — Progress Notes (Signed)
12/15/2017 8:39 AM   DOB: 28-Mar-1961 / MRN: [id number]  SUBJECTIVE:  Valerie [name] is a 57 y.o. female presenting for left knee pain. Symptoms present chronically worse lately.  We discussed a trial of prednisone which did help however her pain is starting with return.  The problem is worsening.  She would like to try a intra-articular steroid shot.  She is allergic to dilaudid [hydromorphone hcl]; effexor [venlafaxine]; pregabalin; metoclopramide hcl; neurontin [gabapentin]; other; and serotonin reuptake inhibitors (ssris).   She  has a past medical history of Acute ischemic colitis ([place]), Anemia (05/01/2012), Anxiety, ARF (acute renal failure) ([place]) (12/28/2015), Arthritis, Asthma, Basal cell carcinoma of upper back (~ 2011), Chronic right-sided headaches, DDD (degenerative disc disease), lumbar, Diarrhea, Fatigue, Fibromyalgia, First degree heart block, GERD (gastroesophageal reflux disease) (04/06/2017), Headache(784.0), History of ischemic colitis (09/24/2016), Hypertension, Mental disorder, Microscopic polyangiitis ([place]), Nerve pain (2011), Pancreatitis, Pneumonia (~ 2014; 08/2015), Polymyalgia rheumatica ([place]) (onset 08/2015), Precancerous melanosis ([place]), and Sleep apnea.    She  reports that she has never smoked. She has never used smokeless tobacco. She reports that she does not drink alcohol or use drugs. She  reports that she does not currently engage in sexual activity. The patient  has a past surgical history that includes Shoulder arthroscopy with rotator cuff repair and open biceps tenodesis (Bilateral, 2010-2013); Nasal septoplasty w/ turbinoplasty (1998); Esophagogastroduodenoscopy (12/21/2011); Flexible sigmoidoscopy (05/01/2012); Excision basal cell carcinoma (~ 2011); Esophagogastroduodenoscopy (N/A, 11/06/2012); Botox injection (N/A, 11/06/2012); Esophagogastroduodenoscopy (N/A, 12/04/2013); Botox injection (N/A, 12/04/2013); Esophagogastroduodenoscopy (N/A, 02/26/2014); Botox injection  (N/A, 02/26/2014); Esophagogastroduodenoscopy (egd) with propofol (N/A, 07/08/2015); Botox injection (N/A, 07/08/2015); Laparoscopic cholecystectomy (01/2002); Esophagogastroduodenoscopy (egd) with propofol (N/A, 04/27/2016); Botox injection (N/A, 04/27/2016); Flexible sigmoidoscopy (Left, 09/28/2016); Esophagogastroduodenoscopy (N/A, 04/09/2017); and Botox injection (N/A, 04/09/2017).  Her family history includes Arthritis in her mother and paternal grandmother; COPD in her mother; Cancer in her mother; Heart disease in her mother, paternal grandfather, and paternal grandmother; Hypertension in her mother.  Review of Systems  Constitutional: Negative for chills, diaphoresis and fever.  Gastrointestinal: Negative for nausea.  Musculoskeletal: Positive for joint pain and myalgias. Negative for back pain, falls and neck pain.  Skin: Negative for rash.  Neurological: Negative for dizziness.    The problem list and medications were reviewed and updated by myself where necessary and exist elsewhere in the encounter.   OBJECTIVE:  BP 128/80   Pulse 82   Temp 98.6 F (37 C) (Oral)   Resp 18   Ht 5' 4.96" (1.65 m)   Wt 295 lb 3.2 oz (133.9 kg)   SpO2 96%   BMI 49.18 kg/m   Wt Readings from Last 3 Encounters:  12/15/17 295 lb 3.2 oz (133.9 kg)  12/03/17 297 lb (134.7 kg)  11/24/17 292 lb (132.5 kg)   Temp Readings from Last 3 Encounters:  12/15/17 98.6 F (37 C) (Oral)  12/03/17 98.3 F (36.8 C) (Oral)  11/24/17 98 F (36.7 C) (Oral)   BP Readings from Last 3 Encounters:  12/15/17 128/80  12/03/17 121/77  11/24/17 118/70   Pulse Readings from Last 3 Encounters:  12/15/17 82  12/03/17 64  11/24/17 79    Physical Exam  Constitutional: She is oriented to person, place, and time. She appears well-nourished.  Non-toxic appearance. No distress.  Eyes: Pupils are equal, round, and reactive to light. EOM are normal.  Cardiovascular: Normal rate, regular rhythm, S1 normal, S2 normal,  normal heart sounds and intact distal pulses. Exam reveals no gallop,  no friction rub and no decreased pulses.  No murmur heard. Pulmonary/Chest: Effort normal. No stridor. No respiratory distress. She has no wheezes. She has no rales.  Abdominal: She exhibits no distension.  Musculoskeletal: She exhibits no edema.  Neurological: She is alert and oriented to person, place, and time. No cranial nerve deficit. Gait normal.  Skin: Skin is warm and dry. She is not diaphoretic. No pallor.  Psychiatric: She has a normal mood and affect.  Vitals reviewed.   Lab Results  Component Value Date   HGBA1C 6.7 (A) 12/03/2017    Lab Results  Component Value Date   WBC 8.6 10/19/2017   HGB 12.6 10/19/2017   HCT 38.2 10/19/2017   MCV 89.0 10/19/2017   PLT 287 09/28/2017    Lab Results  Component Value Date   CREATININE 1.33 (H) 12/03/2017   BUN 37 (H) 12/03/2017   NA 135 12/03/2017   K 4.2 12/03/2017   CL 99 12/03/2017   CO2 22 12/03/2017    Lab Results  Component Value Date   ALT 14 12/03/2017   AST 13 12/03/2017   ALKPHOS 64 12/03/2017   BILITOT <0.2 12/03/2017    Lab Results  Component Value Date   TSH 1.550 12/08/2016    Lab Results  Component Value Date   CHOL 188 07/31/2016   HDL 37 (L) 07/31/2016   LDLCALC 90 07/31/2016   TRIG 305 (H) 07/31/2016   CHOLHDL 5.1 (H) 07/31/2016   Procedure: Risk and benefits discussed and verbal consent obtained.  Betadine prep x3 about the superolateral knee.  Cold spray x10 seconds for anesthesia.  25-gauge needle inserted into the suprapatellar bursa using a supra lateral approach.  Triamcinolone lidocaine mixed injected into the bursa.  There was no resistance to the push.  Patient with immediate relief of symptoms upon ambulation.  She tolerated the procedure without complaint.  ASSESSMENT AND PLAN:  Dorella was seen today for knee pain.  Diagnoses and all orders for this visit:  Chronic pain of left knee: Patient with mild  relief with p.o. steroid from previous visit.  joint injected today.  Patient tolerated procedure without complaint.  RTC in ED precautions discussed.    The patient is advised to call or return to clinic if she does not see an improvement in symptoms, or to seek the care of the closest emergency department if she worsens with the above plan.   Philis Fendt, MHS, PA-C Primary Care at [facility] 12/15/2017 8:39 AM

## 2017-12-15 NOTE — Patient Instructions (Addendum)
If you have any fever, chills, nausea, redness emanating from the knee please come back immediately or go to the emergency department.    IF you received an x-ray today, you will receive an invoice from [facility]. Please contact [facility] at [phone] with questions or concerns regarding your invoice.   IF you received labwork today, you will receive an invoice from [facility]. Please contact [facility] at [phone] with questions or concerns regarding your invoice.   Our billing staff will not be able to assist you with questions regarding bills from these companies.  You will be contacted with the lab results as soon as they are available. The fastest way to get your results is to activate your My Chart account. Instructions are located on the last page of this paperwork. If you have not heard from [place] regarding the results in 2 weeks, please contact this office.

## 2017-12-24 DIAGNOSIS — K219 Gastro-esophageal reflux disease without esophagitis: Secondary | ICD-10-CM | POA: Diagnosis not present

## 2017-12-24 DIAGNOSIS — K559 Vascular disorder of intestine, unspecified: Secondary | ICD-10-CM | POA: Diagnosis not present

## 2017-12-24 DIAGNOSIS — Z8639 Personal history of other endocrine, nutritional and metabolic disease: Secondary | ICD-10-CM | POA: Diagnosis not present

## 2017-12-24 DIAGNOSIS — Z8739 Personal history of other diseases of the musculoskeletal system and connective tissue: Secondary | ICD-10-CM | POA: Diagnosis not present

## 2017-12-24 DIAGNOSIS — F329 Major depressive disorder, single episode, unspecified: Secondary | ICD-10-CM | POA: Diagnosis not present

## 2017-12-24 DIAGNOSIS — F332 Major depressive disorder, recurrent severe without psychotic features: Secondary | ICD-10-CM | POA: Diagnosis not present

## 2017-12-24 DIAGNOSIS — G4733 Obstructive sleep apnea (adult) (pediatric): Secondary | ICD-10-CM | POA: Diagnosis not present

## 2017-12-24 DIAGNOSIS — F4312 Post-traumatic stress disorder, chronic: Secondary | ICD-10-CM | POA: Diagnosis not present

## 2017-12-24 DIAGNOSIS — Z8719 Personal history of other diseases of the digestive system: Secondary | ICD-10-CM | POA: Diagnosis not present

## 2017-12-24 DIAGNOSIS — Z79891 Long term (current) use of opiate analgesic: Secondary | ICD-10-CM | POA: Diagnosis not present

## 2017-12-24 DIAGNOSIS — M461 Sacroiliitis, not elsewhere classified: Secondary | ICD-10-CM | POA: Diagnosis not present

## 2017-12-24 DIAGNOSIS — E669 Obesity, unspecified: Secondary | ICD-10-CM | POA: Diagnosis not present

## 2017-12-24 DIAGNOSIS — M317 Microscopic polyangiitis: Secondary | ICD-10-CM | POA: Diagnosis not present

## 2017-12-24 DIAGNOSIS — R7989 Other specified abnormal findings of blood chemistry: Secondary | ICD-10-CM | POA: Diagnosis not present

## 2017-12-24 DIAGNOSIS — M545 Low back pain: Secondary | ICD-10-CM | POA: Diagnosis not present

## 2017-12-24 DIAGNOSIS — Z87448 Personal history of other diseases of urinary system: Secondary | ICD-10-CM | POA: Diagnosis not present

## 2017-12-24 DIAGNOSIS — Z9989 Dependence on other enabling machines and devices: Secondary | ICD-10-CM | POA: Diagnosis not present

## 2017-12-24 DIAGNOSIS — Z8709 Personal history of other diseases of the respiratory system: Secondary | ICD-10-CM | POA: Diagnosis not present

## 2017-12-25 ENCOUNTER — Other Ambulatory Visit: Payer: Self-pay | Admitting: Family Medicine

## 2017-12-25 DIAGNOSIS — I10 Essential (primary) hypertension: Secondary | ICD-10-CM

## 2017-12-26 ENCOUNTER — Ambulatory Visit: Payer: Medicare Other | Admitting: Neurology

## 2017-12-26 DIAGNOSIS — F3341 Major depressive disorder, recurrent, in partial remission: Secondary | ICD-10-CM | POA: Diagnosis not present

## 2017-12-26 DIAGNOSIS — F4310 Post-traumatic stress disorder, unspecified: Secondary | ICD-10-CM | POA: Diagnosis not present

## 2017-12-26 NOTE — Telephone Encounter (Signed)
Spironolactone refill Last OV:12/03/17; Upcoming 12/29/17 Last refill:07/09/17 90 tab/2 refill DLK:[id number]: [place], [place] [phone] (Phone) [phone] (Fax)

## 2017-12-27 ENCOUNTER — Ambulatory Visit: Payer: Medicare Other | Admitting: Family Medicine

## 2017-12-28 DIAGNOSIS — M542 Cervicalgia: Secondary | ICD-10-CM | POA: Diagnosis not present

## 2017-12-29 ENCOUNTER — Ambulatory Visit (INDEPENDENT_AMBULATORY_CARE_PROVIDER_SITE_OTHER): Payer: Medicare Other | Admitting: Family Medicine

## 2017-12-29 ENCOUNTER — Encounter: Payer: Self-pay | Admitting: Family Medicine

## 2017-12-29 ENCOUNTER — Other Ambulatory Visit: Payer: Self-pay

## 2017-12-29 VITALS — BP 110/62 | HR 78 | Temp 98.4°F | Resp 16 | Wt 297.2 lb

## 2017-12-29 DIAGNOSIS — N183 Chronic kidney disease, stage 3 unspecified: Secondary | ICD-10-CM

## 2017-12-29 DIAGNOSIS — F332 Major depressive disorder, recurrent severe without psychotic features: Secondary | ICD-10-CM | POA: Diagnosis not present

## 2017-12-29 DIAGNOSIS — I10 Essential (primary) hypertension: Secondary | ICD-10-CM

## 2017-12-29 DIAGNOSIS — F411 Generalized anxiety disorder: Secondary | ICD-10-CM | POA: Diagnosis not present

## 2017-12-29 DIAGNOSIS — R1084 Generalized abdominal pain: Secondary | ICD-10-CM | POA: Diagnosis not present

## 2017-12-29 DIAGNOSIS — M2560 Stiffness of unspecified joint, not elsewhere classified: Secondary | ICD-10-CM

## 2017-12-29 DIAGNOSIS — R601 Generalized edema: Secondary | ICD-10-CM | POA: Diagnosis not present

## 2017-12-29 DIAGNOSIS — G8929 Other chronic pain: Secondary | ICD-10-CM

## 2017-12-29 MED ORDER — SPIRONOLACTONE 100 MG PO TABS: 100.0000 mg | ORAL_TABLET | Freq: Every day | ORAL | 1 refills | Status: DC

## 2017-12-29 NOTE — Progress Notes (Signed)
Subjective:    Patient ID: Valerie [name], female    DOB: 1960-12-06, 57 y.o.   MRN: [id number] Chief Complaint  Patient presents with  . kidney problems    follow up    HPI  Good things:  Gastroparesis sxs have gotten severe and BMs stopped. She developed severe peripheral edema to mid calves so she increased her miralax which was helpful and then the torsemide can kick in and so she has lost 7 lbs.  She stopped off on the miralax after the initial megadose so now off and had nml BM last night.  Not spending much time outside in the heat though she is dog and cat sitting this week so will walk more and plans to start doing some more water therapy with her back.  THinking about buying a place rather then renting and she is looking. She stopped down hard and developed pain across the sacrum - she is very tight on the left side. She is seeing PT and her session yesterday helped immensely and she could sleep last night for the first time. The pain was radiating down from her back to her left knee to her left ankle. She had a cortisone injection by Carlis Abbott 2 wks ago which helped initially until she does stairs on Sunday but thinks it is soft tissue rather than bone.   She is taking the torsemide qod and the spironolactone daily.  She saw psych at [facility] Dr. Donneta Romberg who recommended a medication of "mira" something to take. Or "Nuri".  She does have another appointment for psychiatrsit but hasn't found it particulalry helpful as he just asks her how she feels about things and doesn't offer feedback.   Past Medical History:  Diagnosis Date  . Acute ischemic colitis ([place])   . Anemia 05/01/2012  . Anxiety   . ARF (acute renal failure) ([place]) 12/28/2015  . Arthritis    "basically ~ q joint" (12/28/2015)  . Asthma   . Basal cell carcinoma of upper back ~ 2011  . Chronic right-sided headaches    and neck pain, relieved wiht PT and chronic practic manipulation  . DDD (degenerative disc disease),  lumbar   . Diarrhea    EPISODIC  . Fatigue    h/o excessive sedation, etiology unknown  . Fibromyalgia   . First degree heart block   . GERD (gastroesophageal reflux disease) 04/06/2017  . Headache(784.0)    "muscular w/migraine intensity; ~ 5-6 months" (12/28/2015)  . History of ischemic colitis 09/24/2016  . Hypertension   . Mental disorder    reports PTSD  . Microscopic polyangiitis ([place])   . Nerve pain 2011   nerve damage to right arm; normal EMG/NCV to RUExt after shoulder surgery 12/2009  . Pancreatitis    due to Effexor  . Pneumonia ~ 2014; 08/2015   Aspiration  . Polymyalgia rheumatica ([place]) onset 08/2015   changed diagnosis to MPA  . Precancerous melanosis ([place])    "2nd toe left foot; 5th toe of right foot; right temple; had them all cut off"  . Sleep apnea    CPAP-9   Past Surgical History:  Procedure Laterality Date  . BASAL CELL CARCINOMA EXCISION  ~ 2011   back  . BOTOX INJECTION N/A 11/06/2012   Procedure: BOTOX INJECTION;  Surgeon: Missy Sabins, MD;  Location: [facility]COPY;  Service: Endoscopy;  Laterality: N/A;  . BOTOX INJECTION N/A 12/04/2013   Procedure: BOTOX INJECTION;  Surgeon: Missy Sabins, MD;  Location:  [place] ENDOSCOPY;  Service: Endoscopy;  Laterality: N/A;  . BOTOX INJECTION N/A 02/26/2014   Procedure: BOTOX INJECTION;  Surgeon: Missy Sabins, MD;  Location: [place];  Service: Endoscopy;  Laterality: N/A;  . BOTOX INJECTION N/A 07/08/2015   Procedure: BOTOX INJECTION;  Surgeon: Teena Irani, MD;  Location: [facility];  Service: Endoscopy;  Laterality: N/A;  . BOTOX INJECTION N/A 04/27/2016   Procedure: BOTOX INJECTION;  Surgeon: Teena Irani, MD;  Location: [facility];  Service: Endoscopy;  Laterality: N/A;  . BOTOX INJECTION N/A 04/09/2017   Procedure: BOTOX INJECTION;  Surgeon: Clarene Essex, MD;  Location: [place];  Service: Endoscopy;  Laterality: N/A;  . ESOPHAGOGASTRODUODENOSCOPY  12/21/2011   Procedure: ESOPHAGOGASTRODUODENOSCOPY (EGD);   Surgeon: Missy Sabins, MD;  Location: [facility] ENDOSCOPY;  Service: Endoscopy;  Laterality: N/A;  . ESOPHAGOGASTRODUODENOSCOPY N/A 11/06/2012   Procedure: ESOPHAGOGASTRODUODENOSCOPY (EGD);  Surgeon: Missy Sabins, MD;  Location: [facility];  Service: Endoscopy;  Laterality: N/A;  . ESOPHAGOGASTRODUODENOSCOPY N/A 12/04/2013   Procedure: ESOPHAGOGASTRODUODENOSCOPY (EGD);  Surgeon: Missy Sabins, MD;  Location: [facility] ENDOSCOPY;  Service: Endoscopy;  Laterality: N/A;  . ESOPHAGOGASTRODUODENOSCOPY N/A 02/26/2014   Procedure: ESOPHAGOGASTRODUODENOSCOPY (EGD);  Surgeon: Missy Sabins, MD;  Location: [facility] ENDOSCOPY;  Service: Endoscopy;  Laterality: N/A;  . ESOPHAGOGASTRODUODENOSCOPY N/A 04/09/2017   Procedure: ESOPHAGOGASTRODUODENOSCOPY (EGD);  Surgeon: Clarene Essex, MD;  Location: [place];  Service: Endoscopy;  Laterality: N/A;  . ESOPHAGOGASTRODUODENOSCOPY (EGD) WITH PROPOFOL N/A 07/08/2015   Procedure: ESOPHAGOGASTRODUODENOSCOPY (EGD) WITH PROPOFOL;  Surgeon: Teena Irani, MD;  Location: [facility];  Service: Endoscopy;  Laterality: N/A;  . ESOPHAGOGASTRODUODENOSCOPY (EGD) WITH PROPOFOL N/A 04/27/2016   Procedure: ESOPHAGOGASTRODUODENOSCOPY (EGD) WITH PROPOFOL;  Surgeon: Teena Irani, MD;  Location: [facility];  Service: Endoscopy;  Laterality: N/A;  . FLEXIBLE SIGMOIDOSCOPY  05/01/2012   Procedure: FLEXIBLE SIGMOIDOSCOPY;  Surgeon: Jeryl Columbia, MD;  Location: [facility];  Service: Endoscopy;  Laterality: N/A;  unprepped  . FLEXIBLE SIGMOIDOSCOPY Left 09/28/2016   Procedure: FLEXIBLE SIGMOIDOSCOPY;  Surgeon: Wilford Corner, MD;  Location: [facility]OSCOPY;  Service: Endoscopy;  Laterality: Left;  . LAPAROSCOPIC CHOLECYSTECTOMY  01/2002  . NASAL SEPTOPLASTY W/ TURBINOPLASTY  1998  . SHOULDER ARTHROSCOPY WITH ROTATOR CUFF REPAIR AND OPEN BICEPS TENODESIS Bilateral 2010-2013   left-right   Current Outpatient Medications on File Prior to Visit  Medication Sig Dispense Refill  . albuterol (PROAIR HFA) 108 (90  Base) MCG/ACT inhaler Inhale 2 puffs every 4 (four) hours as needed into the lungs for wheezing or shortness of breath. 8.5 each 11  . allopurinol (ZYLOPRIM) 300 MG tablet TAKE 1/2 TABLET BY MOUTH ONCE A DAY 45 tablet 0  . ALPRAZolam (XANAX) 1 MG tablet Take 1 tablet (1 mg total) by mouth 3 (three) times daily as needed for anxiety. 90 tablet 1  . baclofen (LIORESAL) 10 MG tablet Take 10 mg by mouth 3 (three) times daily. Takes as needed    . benazepril (LOTENSIN) 20 MG tablet Take 1 tablet (20 mg total) daily by mouth. 90 tablet 3  . calcium carbonate (OSCAL) 1500 (600 Ca) MG TABS tablet Take 600 mg of elemental calcium by mouth daily with breakfast.    . cetirizine (ZYRTEC) 10 MG tablet Take 10 mg by mouth daily. Alternates between Allegra and Zyrtec as needed for allergies    . Cholecalciferol (VITAMIN D3) 5000 units CAPS Take 1 capsule by mouth.    . dexlansoprazole (DEXILANT) 60 MG capsule Take 60 mg by mouth 2 (two) times daily.    Marland Kitchen  fexofenadine (ALLEGRA) 180 MG tablet Take 180 mg by mouth daily as needed. Switches between Allegra and Zyrtec as needed for allergies    . fluticasone (FLONASE) 50 MCG/ACT nasal spray PLACE 2 SPRAYS INTO BOTH NOSTRILS DAILY 48 g 1  . HYDROcodone-acetaminophen (NORCO) 10-325 MG per tablet Take 1-2 tablets by mouth every 6 (six) hours as needed for moderate pain (pain.).     Marland Kitchen hydrOXYzine (ATARAX/VISTARIL) 10 MG tablet Take 1-3 tablets (10-30 mg total) by mouth 3 (three) times daily as needed. 90 tablet 1  . ipratropium (ATROVENT) 0.03 % nasal spray Place 2 sprays 4 (four) times daily into the nose. 30 mL 1  . metoprolol succinate (TOPROL-XL) 100 MG 24 hr tablet Take 1 tablet (100 mg total) by mouth daily. Take with or immediately following a meal. 90 tablet 3  . modafinil (PROVIGIL) 200 MG tablet Take 0.5 tablets (100 mg total) by mouth 2 (two) times daily. 90 tablet 3  . montelukast (SINGULAIR) 10 MG tablet TAKE ONE TABLET BY MOUTH EVERY NIGHT AT BEDTIME 90 tablet  2  . morphine (MS CONTIN) 30 MG 12 hr tablet Take 30 mg by mouth every 8 (eight) hours. Not from [facility]    . Multiple Vitamins-Minerals (MULTIVITAMIN PO) Take 1 tablet by mouth daily.    . Olopatadine HCl (PAZEO) 0.7 % SOLN Apply 1 drop to eye daily. 2.5 mL 11  . ondansetron (ZOFRAN) 4 MG tablet Take 1 tablet (4 mg total) by mouth every 8 (eight) hours as needed for nausea or vomiting. 20 tablet 0  . Pancrelipase, Lip-Prot-Amyl, (CREON) 24000 UNITS CPEP Take 72,000 Units by mouth 3 (three) times daily as needed (for pancreatitis).     . promethazine (PHENERGAN) 25 MG tablet Take 1 tablet (25 mg total) by mouth every 6 (six) hours as needed for nausea or vomiting. 30 tablet 1  . spironolactone (ALDACTONE) 100 MG tablet Take 1 tablet (100 mg total) by mouth daily. 90 tablet 0  . sucralfate (CARAFATE) 1 GM/10ML suspension Take 10 mLs (1 g total) by mouth 4 (four) times daily as needed (stomach pain). 420 mL 1  . torsemide (DEMADEX) 10 MG tablet TAKE ONE TABLET BY MOUTH DAILY (DISCONTINUE USE OF FUROSEMIDE) 30 tablet 0  . fluticasone (FLONASE) 50 MCG/ACT nasal spray PLACE 2 SPRAYS INTO BOTH NOSTRILS DAILY 48 g 1   No current facility-administered medications on file prior to visit.    Allergies  Allergen Reactions  . Dilaudid [Hydromorphone Hcl] Other (See Comments)    Headache   . Effexor [Venlafaxine]     pancreatitis  . Pregabalin Other (See Comments)    Drug induced stupor  . Metoclopramide Hcl Other (See Comments)    "crawling inside"  . Neurontin [Gabapentin] Other (See Comments)    Tremors   . Other Other (See Comments)    Avoid all SSRI's and SNRI's- damaged pancreas  . Serotonin Reuptake Inhibitors (Ssris) Other (See Comments)    Damage to pancreas and bowel/urinary incontinence    Family History  Problem Relation Age of Onset  . Heart disease Mother   . COPD Mother   . Arthritis Mother   . Cancer Mother   . Hypertension Mother   . Arthritis Paternal Grandmother   . Heart  disease Paternal Grandmother   . Heart disease Paternal Grandfather    Social History   Socioeconomic History  . Marital status: Single    Spouse name: Not on file  . Number of children: 0  . Years  of education: College  . Highest education level: Not on file  Occupational History  . Occupation: disabled  Social Needs  . Financial resource strain: Not on file  . Food insecurity:    Worry: Not on file    Inability: Not on file  . Transportation needs:    Medical: Not on file    Non-medical: Not on file  Tobacco Use  . Smoking status: Never Smoker  . Smokeless tobacco: Never Used  Substance and Sexual Activity  . Alcohol use: No  . Drug use: No  . Sexual activity: Not Currently  Lifestyle  . Physical activity:    Days per week: Not on file    Minutes per session: Not on file  . Stress: Not on file  Relationships  . Social connections:    Talks on phone: Not on file    Gets together: Not on file    Attends religious service: Not on file    Active member of club or organization: Not on file    Attends meetings of clubs or organizations: Not on file    Relationship status: Not on file  Other Topics Concern  . Not on file  Social History Narrative   Caffeine 2 cups iced tea daily avg.   Depression screen [facility] 2/9 12/29/2017 12/15/2017 12/03/2017 11/24/2017 10/19/2017  Decreased Interest 0 0 0 0 0  Down, Depressed, Hopeless 0 0 0 0 0  PHQ - 2 Score 0 0 0 0 0  Altered sleeping - - - - -  Tired, decreased energy - - - - -  Change in appetite - - - - -  Feeling bad or failure about yourself  - - - - -  Trouble concentrating - - - - -  Moving slowly or fidgety/restless - - - - -  Suicidal thoughts - - - - -  PHQ-9 Score - - - - -  Difficult doing work/chores - - - - -  Some recent data might be hidden      Review of Systems See hpi    Objective:   Physical Exam  Constitutional: She is oriented to person, place, and time. She appears well-developed and  well-nourished. No distress.  HENT:  Head: Normocephalic and atraumatic.  Right Ear: External ear normal.  Left Ear: External ear normal.  Eyes: Conjunctivae are normal. No scleral icterus.  Neck: Normal range of motion. Neck supple. No thyromegaly present.  Cardiovascular: Normal rate, regular rhythm, normal heart sounds and intact distal pulses.  Pulmonary/Chest: Effort normal and breath sounds normal. No respiratory distress.  Musculoskeletal: She exhibits no edema.  Lymphadenopathy:    She has no cervical adenopathy.  Neurological: She is alert and oriented to person, place, and time.  Skin: Skin is warm and dry. She is not diaphoretic. No erythema.  Psychiatric: Her speech is normal and behavior is normal. She is not agitated, not aggressive, not withdrawn and not combative. Cognition and memory are not impaired. She does not express impulsivity or inappropriate judgment. She exhibits a depressed mood (mild - smiles and chuckles occasionally but mainly about how poorly things are going or the unfortunate irony of life). She expresses no homicidal and no suicidal ideation. She expresses no suicidal plans and no homicidal plans. She exhibits normal recent memory and normal remote memory. She is attentive.      BP 110/62 (BP Location: Left Wrist)   Pulse 78   Temp 98.4 F (36.9 C) (Oral)   Resp 16  Wt 297 lb 3.2 oz (134.8 kg)   SpO2 95%   BMI 49.52 kg/m   Assessment & Plan:   1. CKD (chronic kidney disease) stage 3, GFR 30-59 ml/min (HCC)   2. Essential hypertension   3. Severe episode of recurrent major depressive disorder, without psychotic features ([place]) - saw [facility] who was able to recommend a depression med that she could try despite her allergies but very reluctant - they are supposed to send me a note but are not in [place] due to sensitive nature I suspect and I have not seen the c/s note yet so not sure what med we are discussing but likely [name] and I will  keep it as a "safety" plan that we could start if ever her mood sxs sig worsened. Does not have to continue seeing psych at [facility] since she is not finding the therapy part beneficial as she is seeing her own therapist for many yrs regularly still which she feels is still a very beneficial therapeutic partnership. Very proud of her for being willing to see psych on my request despite her PTSD/reluctance and yrs of refusal so will not force her to cont.  4. Multiple stiff joints - on chronic pain medicine  5. Generalized edema - pt constantly balancing her diuretics to not keep her hands and feet swollen while trying to avoid further renal/electrolyte imbalance so will occ make small adjustments on her own to keep sxs controlled.  FOr now, cont torsemide qod and spiro 100 qd  6. Abdominal pain, chronic, generalized   7. Anxiety state     Orders Placed This Encounter  Procedures  . Basic metabolic panel    Order Specific Question:   Has the patient fasted?    Answer:   No    Meds ordered this encounter  Medications  . spironolactone (ALDACTONE) 100 MG tablet    Sig: Take 1 tablet (100 mg total) by mouth daily.    Dispense:  90 tablet    Refill:  1    Delman Cheadle, M.D.  Primary Care at [facility] [address] [place], [place] [address] [phone] phone [phone] fax  12/29/17 8:54 AM

## 2017-12-29 NOTE — Patient Instructions (Addendum)
You can reduce your uric acid level by avoiding red meat, saturated fats (dairy fats, butter fats, animal fats), high fructose corn syrup, and beer and drinking more water. .   IF you received an x-ray today, you will receive an invoice from [facility]. Please contact [facility] at [phone] with questions or concerns regarding your invoice.   IF you received labwork today, you will receive an invoice from [facility]. Please contact [facility] at [phone] with questions or concerns regarding your invoice.   Our billing staff will not be able to assist you with questions regarding bills from these companies.  You will be contacted with the lab results as soon as they are available. The fastest way to get your results is to activate your My Chart account. Instructions are located on the last page of this paperwork. If you have not heard from [place] regarding the results in 2 weeks, please contact this office.    There are some easy diet adjustments that you can make to lower your blood uric acid level and thereby hopefully never have to suffer from another gout flair (or at least less frequently).  You should avoid alcohol, drink plenty of water, and try to follow a "low purine" diet as your body produces uric acid when it breaks down prurines-substances that are found naturally in your body, as well as in certain foods such as organ meats, anchioves, herring, asparagus, and mushrooms. Also, increasing your diet in certain foods that may lower uric acid levels is a pretty safe way to decrease your likelihood of gout so consider drinking coffee (regular and/or decaf), eating fruits with Vitamin C in them such as citrus fruits, strawberries, broccoli,  brussel sprouts, papaya, and cantaloupe (though megadoses of vitamin C supplements may do the opposite and increase your body's uric acid levels), and/or eating more cherries and other dark-colored fruits, such as blackberries,  blueberries, purple grapes and raspberries. In addition, getting plenty of vitamin A though yellow fruits, or dark green/yellow vegetables at least every other day is good.  Other general diet guidelines for people with gout who need to lower their blood uric acid levels are as follows:  Drink 8 to 16 cups ( about 2 to 4 liters) of fluid each day, with at least half being water. Eat a moderate amount of protein, preferably from healthy sources, such as low-fat or fat-free dairy, tofu, eggs, and nut butters. Limit your daily intake of meat, fish, and poultry to 4 to 6 ounces. Avoid high fat meats and desserts. Decrease your intake of shellfish, beef, lamb, pork, eggs and cheese. Avoid drastic weight reduction or fasting.  If weight loss is desired lose it over a period of several months.  Low-Purine Diet Purines are compounds that affect the level of uric acid in your body. A low-purine diet is a diet that is low in purines. Eating a low-purine diet can prevent the level of uric acid in your body from getting too high and causing gout or kidney stones or both. What do I need to know about this diet?  Choose low-purine foods. Examples of low-purine foods are listed in the next section.  Drink plenty of fluids, especially water. Fluids can help remove uric acid from your body. Try to drink 8-16 cups (1.9-3.8 L) a day.  Limit foods high in fat, especially saturated fat, as fat makes it harder for the body to get rid of uric acid. Foods high in saturated fat include pizza, cheese, ice cream,  whole milk, fried foods, and gravies. Choose foods that are lower in fat and lean sources of protein. Use olive oil when cooking as it contains healthy fats that are not high in saturated fat.  Limit alcohol. Alcohol interferes with the elimination of uric acid from your body. If you are having a gout attack, avoid all alcohol.  Keep in mind that different people's bodies react differently to different foods.  You will probably learn over time which foods do or do not affect you. If you discover that a food tends to cause your gout to flare up, avoid eating that food. You can more freely enjoy foods that do not cause problems. If you have any questions about a food item, talk to your dietitian or health care provider. Which foods are low, moderate, and high in purines? The following is a list of foods that are low, moderate, and high in purines. You can eat any amount of the foods that are low in purines. You may be able to have small amounts of foods that are moderate in purines. Ask your health care provider how much of a food moderate in purines you can have. Avoid foods high in purines. Grains  Foods low in purines: Enriched white bread, pasta, rice, cake, cornbread, popcorn.  Foods moderate in purines: Whole-grain breads and cereals, wheat germ, bran, oatmeal. Uncooked oatmeal. Dry wheat bran or wheat germ.  Foods high in purines: Pancakes, [place] toast, biscuits, muffins. Vegetables  Foods low in purines: All vegetables, except those that are moderate in purines.  Foods moderate in purines: Asparagus, cauliflower, spinach, mushrooms, green peas. Fruits  All fruits are low in purines. Meats and other Protein Foods  Foods low in purines: Eggs, nuts, peanut butter.  Foods moderate in purines: 80-90% lean beef, lamb, veal, pork, poultry, fish, eggs, peanut butter, nuts. Crab, lobster, oysters, and shrimp. Cooked dried beans, peas, and lentils.  Foods high in purines: Anchovies, sardines, herring, mussels, tuna, codfish, scallops, trout, and haddock. Berniece Salines. Organ meats (such as liver or kidney). Tripe. Game meat. Goose. Sweetbreads. Dairy  All dairy foods are low in purines. Low-fat and fat-free dairy products are best because they are low in saturated fat. Beverages  Drinks low in purines: Water, carbonated beverages, tea, coffee, cocoa.  Drinks moderate in purines: Soft drinks and other  drinks sweetened with high-fructose corn syrup. Juices. To find whether a food or drink is sweetened with high-fructose corn syrup, look at the ingredients list.  Drinks high in purines: Alcoholic beverages (such as beer). Condiments  Foods low in purines: Salt, herbs, olives, pickles, relishes, vinegar.  Foods moderate in purines: Butter, margarine, oils, mayonnaise. Fats and Oils  Foods low in purines: All types, except gravies and sauces made with meat.  Foods high in purines: Gravies and sauces made with meat. Other Foods  Foods low in purines: Sugars, sweets, gelatin. Cake. Soups made without meat.  Foods moderate in purines: Meat-based or fish-based soups, broths, or bouillons. Foods and drinks sweetened with high-fructose corn syrup.  Foods high in purines: High-fat desserts (such as ice cream, cookies, cakes, pies, doughnuts, and chocolate). Contact your dietitian for more information on foods that are not listed here. This information is not intended to replace advice given to you by your health care provider. Make sure you discuss any questions you have with your health care provider. Document Released: 09/23/2010 Document Revised: 11/04/2015 Document Reviewed: 05/05/2013 Elsevier Interactive Patient Education  2017 [employer].

## 2017-12-30 ENCOUNTER — Other Ambulatory Visit: Payer: Self-pay | Admitting: Family Medicine

## 2017-12-30 DIAGNOSIS — E790 Hyperuricemia without signs of inflammatory arthritis and tophaceous disease: Secondary | ICD-10-CM

## 2017-12-30 LAB — BASIC METABOLIC PANEL
BUN/Creatinine Ratio: 17 (ref 9–23)
BUN: 23 mg/dL (ref 6–24)
CALCIUM: 9.8 mg/dL (ref 8.7–10.2)
CHLORIDE: 97 mmol/L (ref 96–106)
CO2: 22 mmol/L (ref 20–29)
Creatinine, Ser: 1.36 mg/dL — ABNORMAL HIGH (ref 0.57–1.00)
GFR calc Af Amer: 51 mL/min/{1.73_m2} — ABNORMAL LOW (ref >59)
GFR calc non Af Amer: 44 mL/min/{1.73_m2} — ABNORMAL LOW (ref >59)
GLUCOSE: 140 mg/dL — AB (ref 65–99)
POTASSIUM: 4.7 mmol/L (ref 3.5–5.2)
SODIUM: 137 mmol/L (ref 134–144)

## 2018-01-04 DIAGNOSIS — M542 Cervicalgia: Secondary | ICD-10-CM | POA: Diagnosis not present

## 2018-01-11 DIAGNOSIS — M542 Cervicalgia: Secondary | ICD-10-CM | POA: Diagnosis not present

## 2018-01-17 DIAGNOSIS — M542 Cervicalgia: Secondary | ICD-10-CM | POA: Diagnosis not present

## 2018-01-24 DIAGNOSIS — M542 Cervicalgia: Secondary | ICD-10-CM | POA: Diagnosis not present

## 2018-02-06 DIAGNOSIS — M542 Cervicalgia: Secondary | ICD-10-CM | POA: Diagnosis not present

## 2018-02-08 ENCOUNTER — Other Ambulatory Visit: Payer: Self-pay | Admitting: Family Medicine

## 2018-02-08 NOTE — Progress Notes (Signed)
Subjective:    Patient ID: Valerie [name], female    DOB: August 07, 1960, 57 y.o.   MRN: [id number] Chief Complaint  Patient presents with  . Chronic Kidney Disease    per patient follow up  . Hypertension    follow up    HPI  Still changing the torsemide as she will skip several days when she feels like she is been dehydrated. She sweats very easily with minimal activity.   She has IT band syndrome causing knee pain and had started stretches from PT - she saw her PT [place] sev d ago which helped.   GERD controlled unless makes a dietary mistake - only needing to take dexilant once a day but occ twice a day for break through sxs. nexium is the only other thing that helps. Omeprazole, zantac, ranitidine did not work.  Psych suggested mirtazapine if ever needed. . ..  Has needed more of the xanax and hydoxyzine in the past few mos than she has in a long time. She did have phone visit with the psychiatrist which helped her understand what was going on - she felt it was helpful but admits that her sxs haven't changed. Sees psych every 3 mos but can call if needs to which she usually doesn't have to.  Having to use the xanax 1-2x/d and the hydroxyzine 1-2x/d more at bedtime as can't fall asleep. Her mother's death sev years ago and now the minister that was her good friend retired sev mos ago and so she has been having to work more with the [employer] who is not very nice to her - in fact says outright insulting things about her to others in her presence! - so working more with the senior minister who is new so she is having to teach him about her PTSD.  Technical brewer asked to set up a standing monthly meeting with her which causes some anxiety but I reinforced that she does so much this is probably a way for him to formally validate that and intentionally build a good relationship  Used to be on tekturna but had to change to benazepril due to insurance.   Has been tyring to go to  the indoor salt water pool at [facility] with a friend 2-3x/wk to keep her back looser. She is walking and doing some in water exercises.  Past Medical History:  Diagnosis Date  . Acute ischemic colitis ([place])   . Anemia 05/01/2012  . Anxiety   . ARF (acute renal failure) ([place]) 12/28/2015  . Arthritis    "basically ~ q joint" (12/28/2015)  . Asthma   . Basal cell carcinoma of upper back ~ 2011  . Chronic right-sided headaches    and neck pain, relieved wiht PT and chronic practic manipulation  . DDD (degenerative disc disease), lumbar   . Diarrhea    EPISODIC  . Fatigue    h/o excessive sedation, etiology unknown  . Fibromyalgia   . First degree heart block   . GERD (gastroesophageal reflux disease) 04/06/2017  . Headache(784.0)    "muscular w/migraine intensity; ~ 5-6 months" (12/28/2015)  . History of ischemic colitis 09/24/2016  . Hypertension   . Mental disorder    reports PTSD  . Microscopic polyangiitis ([place])   . Nerve pain 2011   nerve damage to right arm; normal EMG/NCV to RUExt after shoulder surgery 12/2009  . Pancreatitis    due to Effexor  . Pneumonia ~ 2014; 08/2015  Aspiration  . Polymyalgia rheumatica ([place]) onset 08/2015   changed diagnosis to MPA  . Precancerous melanosis ([place])    "2nd toe left foot; 5th toe of right foot; right temple; had them all cut off"  . Sleep apnea    CPAP-9   Past Surgical History:  Procedure Laterality Date  . BASAL CELL CARCINOMA EXCISION  ~ 2011   back  . BOTOX INJECTION N/A 11/06/2012   Procedure: BOTOX INJECTION;  Surgeon: Missy Sabins, MD;  Location: [facility];  Service: Endoscopy;  Laterality: N/A;  . BOTOX INJECTION N/A 12/04/2013   Procedure: BOTOX INJECTION;  Surgeon: Missy Sabins, MD;  Location: [place];  Service: Endoscopy;  Laterality: N/A;  . BOTOX INJECTION N/A 02/26/2014   Procedure: BOTOX INJECTION;  Surgeon: Missy Sabins, MD;  Location: [place];  Service: Endoscopy;  Laterality: N/A;  . BOTOX  INJECTION N/A 07/08/2015   Procedure: BOTOX INJECTION;  Surgeon: Teena Irani, MD;  Location: [facility];  Service: Endoscopy;  Laterality: N/A;  . BOTOX INJECTION N/A 04/27/2016   Procedure: BOTOX INJECTION;  Surgeon: Teena Irani, MD;  Location: [facility];  Service: Endoscopy;  Laterality: N/A;  . BOTOX INJECTION N/A 04/09/2017   Procedure: BOTOX INJECTION;  Surgeon: Clarene Essex, MD;  Location: [place];  Service: Endoscopy;  Laterality: N/A;  . ESOPHAGOGASTRODUODENOSCOPY  12/21/2011   Procedure: ESOPHAGOGASTRODUODENOSCOPY (EGD);  Surgeon: Missy Sabins, MD;  Location: [facility] ENDOSCOPY;  Service: Endoscopy;  Laterality: N/A;  . ESOPHAGOGASTRODUODENOSCOPY N/A 11/06/2012   Procedure: ESOPHAGOGASTRODUODENOSCOPY (EGD);  Surgeon: Missy Sabins, MD;  Location: [facility];  Service: Endoscopy;  Laterality: N/A;  . ESOPHAGOGASTRODUODENOSCOPY N/A 12/04/2013   Procedure: ESOPHAGOGASTRODUODENOSCOPY (EGD);  Surgeon: Missy Sabins, MD;  Location: [facility] ENDOSCOPY;  Service: Endoscopy;  Laterality: N/A;  . ESOPHAGOGASTRODUODENOSCOPY N/A 02/26/2014   Procedure: ESOPHAGOGASTRODUODENOSCOPY (EGD);  Surgeon: Missy Sabins, MD;  Location: [facility] ENDOSCOPY;  Service: Endoscopy;  Laterality: N/A;  . ESOPHAGOGASTRODUODENOSCOPY N/A 04/09/2017   Procedure: ESOPHAGOGASTRODUODENOSCOPY (EGD);  Surgeon: Clarene Essex, MD;  Location: [place];  Service: Endoscopy;  Laterality: N/A;  . ESOPHAGOGASTRODUODENOSCOPY (EGD) WITH PROPOFOL N/A 07/08/2015   Procedure: ESOPHAGOGASTRODUODENOSCOPY (EGD) WITH PROPOFOL;  Surgeon: Teena Irani, MD;  [facility];  Service: Endoscopy;  Laterality: N/A;  . ESOPHAGOGASTRODUODENOSCOPY (EGD) WITH PROPOFOL N/A 04/27/2016   Procedure: ESOPHAGOGASTRODUODENOSCOPY (EGD) WITH PROPOFOL;  Surgeon: Teena Irani, MD;  [facility];  Service: Endoscopy;  Laterality: N/A;  . FLEXIBLE SIGMOIDOSCOPY  05/01/2012   Procedure: FLEXIBLE SIGMOIDOSCOPY;  Surgeon: Jeryl Columbia, MD;  Location: [facility];   Service: Endoscopy;  Laterality: N/A;  unprepped  . FLEXIBLE SIGMOIDOSCOPY Left 09/28/2016   Procedure: FLEXIBLE SIGMOIDOSCOPY;  Surgeon: Wilford Corner, MD;  Location: [facility];  Service: Endoscopy;  Laterality: Left;  . LAPAROSCOPIC CHOLECYSTECTOMY  01/2002  . NASAL SEPTOPLASTY W/ TURBINOPLASTY  1998  . SHOULDER ARTHROSCOPY WITH ROTATOR CUFF REPAIR AND OPEN BICEPS TENODESIS Bilateral 2010-2013   left-right   Current Outpatient Medications on File Prior to Visit  Medication Sig Dispense Refill  . albuterol (PROAIR HFA) 108 (90 Base) MCG/ACT inhaler Inhale 2 puffs every 4 (four) hours as needed into the lungs for wheezing or shortness of breath. 8.5 each 11  . allopurinol (ZYLOPRIM) 300 MG tablet TAKE 1/2 TABLET BY MOUTH ONCE A DAY 45 tablet 0  . ALPRAZolam (XANAX) 1 MG tablet Take 1 tablet (1 mg total) by mouth 3 (three) times daily as needed for anxiety. 90 tablet 1  . baclofen (LIORESAL) 10 MG tablet  Take 10 mg by mouth 3 (three) times daily. Takes as needed    . benazepril (LOTENSIN) 20 MG tablet Take 1 tablet (20 mg total) daily by mouth. 90 tablet 3  . calcium carbonate (OSCAL) 1500 (600 Ca) MG TABS tablet Take 600 mg of elemental calcium by mouth daily with breakfast.    . cetirizine (ZYRTEC) 10 MG tablet Take 10 mg by mouth daily. Alternates between Allegra and Zyrtec as needed for allergies    . Cholecalciferol (VITAMIN D3) 5000 units CAPS Take 1 capsule by mouth.    . dexlansoprazole (DEXILANT) 60 MG capsule Take 60 mg by mouth 2 (two) times daily.    . fexofenadine (ALLEGRA) 180 MG tablet Take 180 mg by mouth daily as needed. Switches between Allegra and Zyrtec as needed for allergies    . fluticasone (FLONASE) 50 MCG/ACT nasal spray PLACE 2 SPRAYS INTO BOTH NOSTRILS DAILY 48 g 1  . HYDROcodone-acetaminophen (NORCO) 10-325 MG per tablet Take 1-2 tablets by mouth every 6 (six) hours as needed for moderate pain (pain.).     Marland Kitchen hydrOXYzine (ATARAX/VISTARIL) 10 MG tablet Take 1-3  tablets (10-30 mg total) by mouth 3 (three) times daily as needed. 90 tablet 1  . ipratropium (ATROVENT) 0.03 % nasal spray Place 2 sprays 4 (four) times daily into the nose. 30 mL 1  . metoprolol succinate (TOPROL-XL) 100 MG 24 hr tablet Take 1 tablet (100 mg total) by mouth daily. Take with or immediately following a meal. 90 tablet 3  . modafinil (PROVIGIL) 200 MG tablet Take 0.5 tablets (100 mg total) by mouth 2 (two) times daily. 90 tablet 3  . montelukast (SINGULAIR) 10 MG tablet TAKE ONE TABLET BY MOUTH EVERY NIGHT AT BEDTIME 90 tablet 2  . morphine (MS CONTIN) 30 MG 12 hr tablet Take 30 mg by mouth every 8 (eight) hours. Not from [place]    . Multiple Vitamins-Minerals (MULTIVITAMIN PO) Take 1 tablet by mouth daily.    . Olopatadine HCl (PAZEO) 0.7 % SOLN Apply 1 drop to eye daily. 2.5 mL 11  . ondansetron (ZOFRAN) 4 MG tablet Take 1 tablet (4 mg total) by mouth every 8 (eight) hours as needed for nausea or vomiting. 20 tablet 0  . Pancrelipase, Lip-Prot-Amyl, (CREON) 24000 UNITS CPEP Take 72,000 Units by mouth 3 (three) times daily as needed (for pancreatitis).     . promethazine (PHENERGAN) 25 MG tablet Take 1 tablet (25 mg total) by mouth every 6 (six) hours as needed for nausea or vomiting. 30 tablet 1  . Pseudoephedrine HCl (SUDAFED PO) Take by mouth as needed.    Marland Kitchen spironolactone (ALDACTONE) 100 MG tablet Take 1 tablet (100 mg total) by mouth daily. 90 tablet 1  . sucralfate (CARAFATE) 1 GM/10ML suspension Take 10 mLs (1 g total) by mouth 4 (four) times daily as needed (stomach pain). 420 mL 1  . torsemide (DEMADEX) 10 MG tablet TAKE ONE TABLET BY MOUTH DAILY (DISCONTINUE USE OF FUROSEMIDE) 30 tablet 0  . fluticasone (FLONASE) 50 MCG/ACT nasal spray PLACE 2 SPRAYS INTO BOTH NOSTRILS DAILY 48 g 1   No current facility-administered medications on file prior to visit.    Allergies  Allergen Reactions  . Dilaudid [Hydromorphone Hcl] Other (See Comments)    Headache   . Effexor  [Venlafaxine]     pancreatitis  . Pregabalin Other (See Comments)    Drug induced stupor  . Metoclopramide Hcl Other (See Comments)    "crawling inside"  . Neurontin [Gabapentin]  Other (See Comments)    Tremors   . Other Other (See Comments)    Avoid all SSRI's and SNRI's- damaged pancreas  . Serotonin Reuptake Inhibitors (Ssris) Other (See Comments)    Damage to pancreas and bowel/urinary incontinence    Family History  Problem Relation Age of Onset  . Heart disease Mother   . COPD Mother   . Arthritis Mother   . Cancer Mother   . Hypertension Mother   . Arthritis Paternal Grandmother   . Heart disease Paternal Grandmother   . Heart disease Paternal Grandfather    Social History   Socioeconomic History  . Marital status: Single    Spouse name: Not on file  . Number of children: 0  . Years of education: College  . Highest education level: Not on file  Occupational History  . Occupation: disabled  Social Needs  . Financial resource strain: Not on file  . Food insecurity:    Worry: Not on file    Inability: Not on file  . Transportation needs:    Medical: Not on file    Non-medical: Not on file  Tobacco Use  . Smoking status: Never Smoker  . Smokeless tobacco: Never Used  Substance and Sexual Activity  . Alcohol use: No  . Drug use: No  . Sexual activity: Not Currently  Lifestyle  . Physical activity:    Days per week: Not on file    Minutes per session: Not on file  . Stress: Not on file  Relationships  . Social connections:    Talks on phone: Not on file    Gets together: Not on file    Attends religious service: Not on file    Active member of club or organization: Not on file    Attends meetings of clubs or organizations: Not on file    Relationship status: Not on file  Other Topics Concern  . Not on file  Social History Narrative   Caffeine 2 cups iced tea daily avg.   Depression screen [facility] 2/9 02/09/2018 12/29/2017 12/15/2017 12/03/2017 11/24/2017    Decreased Interest 0 0 0 0 0  Down, Depressed, Hopeless 0 0 0 0 0  PHQ - 2 Score 0 0 0 0 0  Altered sleeping - - - - -  Tired, decreased energy - - - - -  Change in appetite - - - - -  Feeling bad or failure about yourself  - - - - -  Trouble concentrating - - - - -  Moving slowly or fidgety/restless - - - - -  Suicidal thoughts - - - - -  PHQ-9 Score - - - - -  Difficult doing work/chores - - - - -  Some recent data might be hidden    Review of Systems See hpi    Objective:   Physical Exam  Constitutional: She is oriented to person, place, and time. She appears well-developed and well-nourished. No distress.  HENT:  Head: Normocephalic and atraumatic.  Right Ear: External ear normal.  Left Ear: External ear normal.  Eyes: Conjunctivae are normal. No scleral icterus.  Neck: Normal range of motion. Neck supple. No thyromegaly present.  Cardiovascular: Normal rate, regular rhythm, normal heart sounds and intact distal pulses.  Pulmonary/Chest: Effort normal and breath sounds normal. No respiratory distress.  Musculoskeletal: She exhibits no edema.  Lymphadenopathy:    She has no cervical adenopathy.  Neurological: She is alert and oriented to person, place, and time.  Skin:  Skin is warm and dry. She is not diaphoretic. No erythema.  Psychiatric: She has a normal mood and affect. Her behavior is normal.      BP 135/87   Pulse 69   Temp 98.5 F (36.9 C) (Oral)   Resp 16   Wt 299 lb 6.4 oz (135.8 kg)   SpO2 97%   BMI 49.88 kg/m     Dg Chest 2 View  Result Date: 02/09/2018 CLINICAL DATA:  The patient noticed a right supraclavicular mass last night. EXAM: CHEST - 2 VIEW COMPARISON:  PA and lateral chest 11/04/2016. FINDINGS: Lungs clear. Heart size normal. No pneumothorax or pleural effusion. Supraclavicular structures are unchanged in appearance and symmetric from right to left. No acute or focal bony abnormality. IMPRESSION: Negative exam. Electronically Signed   By:  Inge Rise M.D.   On: 02/09/2018 09:08    Assessment & Plan:    Over 40 min spent in face-to-face evaluation of and consultation with patient and coordination of care.  Over 50% of this time was spent counseling this patient regarding worsening anxiety, ppi effect on CKD - could be worsening, acei use in CKD and need to stop for dehydration, supraclavicular mass.  VERY mild normocytic anemia on labs today - likely ACD/CKD but recheck cbc at next routine OV  1. Essential hypertension - doing well on benazepril - cont current regimen  2. CKD (chronic kidney disease) stage 3, GFR 30-59 ml/min (HCC)   3. Supraclavicular mass - On my way out the room, with the door open, pt asked me to quickly evaluate a mass on her right shoulder above her mid right clavicle. CXR, esr/crp, cbc all normal reassuring - suspect muscle spasm/trigger point vs lipoma - pt has PT appointment coming up with [name] so ask him to eval and if he has any concerns or persists at next appt in 4-6 wks, will proceed with additional imaging   4. Impaired glucose tolerance   5. Obesity, morbid ([place])   6. Hypertriglyceridemia - SIG IMPROVING!!!! On labs today  7. Metabolic syndrome   8. OSA on CPAP   9. Severe episode of recurrent major depressive disorder, without psychotic features ([place])   10. Generalized edema - prn torsemide working well -taking most days as if skips will quickly notice puffiness in hands and stiffness with increased pain.  11. Anxiety state - increased use of hydroxyzine and alprazolam due to emotional/social stressors - reviewed in detail - she had phone appt w/ long time therapist - reviewed need to consider alternative providers who she could see face to face or more freq if stress does not abate - watching alprazolam use not to be punitive - ok to use when need it (traditionally taking true prn style but averages out to ~1 tab/d traditionally so reviewed that if use continues to increase and doesn't  naturally trend back to her baseline as she finds ways to deal with stress, than that can just be a way to remind [place] that perhaps it is time to try something else to deal with her mood issues like a in person/new therapist.)  Psych rec she try mirtazapine but I am VERY reluctant due to weight gain her past poor experiences with antidepressents so will hold off at this point - pt thinks things will improve so will give time    Orders Placed This Encounter  Procedures  . DG Chest 2 View    Standing Status:   Future    Number of  Occurrences:   1    Standing Expiration Date:   02/09/2019    Order Specific Question:   Reason for Exam (SYMPTOM  OR DIAGNOSIS REQUIRED)    Answer:   right supraclavicular mass    Order Specific Question:   Is the patient pregnant?    Answer:   No    Order Specific Question:   Preferred imaging location?    Answer:   External  . Basic metabolic panel    Order Specific Question:   Has the patient fasted?    Answer:   No  . Lipid panel    Order Specific Question:   Has the patient fasted?    Answer:   No  . TSH  . Sedimentation Rate  . C-reactive protein  . POCT urinalysis dipstick  . POCT CBC      Delman Cheadle, M.D.  Primary Care at [facility] [address] [place], [place] [address] [phone] phone [phone] fax  02/22/18 12:50 PM

## 2018-02-08 NOTE — Telephone Encounter (Signed)
Pt has appt scheduled on tomorrow with Dr. Brigitte Pulse.

## 2018-02-09 ENCOUNTER — Encounter: Payer: Self-pay | Admitting: Family Medicine

## 2018-02-09 ENCOUNTER — Ambulatory Visit (INDEPENDENT_AMBULATORY_CARE_PROVIDER_SITE_OTHER): Payer: Medicare Other

## 2018-02-09 ENCOUNTER — Other Ambulatory Visit: Payer: Self-pay

## 2018-02-09 ENCOUNTER — Ambulatory Visit (INDEPENDENT_AMBULATORY_CARE_PROVIDER_SITE_OTHER): Payer: Medicare Other | Admitting: Family Medicine

## 2018-02-09 VITALS — BP 101/70 | HR 69 | Temp 98.5°F | Resp 16 | Wt 299.4 lb

## 2018-02-09 DIAGNOSIS — E781 Pure hyperglyceridemia: Secondary | ICD-10-CM

## 2018-02-09 DIAGNOSIS — R601 Generalized edema: Secondary | ICD-10-CM

## 2018-02-09 DIAGNOSIS — F332 Major depressive disorder, recurrent severe without psychotic features: Secondary | ICD-10-CM

## 2018-02-09 DIAGNOSIS — R222 Localized swelling, mass and lump, trunk: Secondary | ICD-10-CM | POA: Diagnosis not present

## 2018-02-09 DIAGNOSIS — R7302 Impaired glucose tolerance (oral): Secondary | ICD-10-CM | POA: Diagnosis not present

## 2018-02-09 DIAGNOSIS — N183 Chronic kidney disease, stage 3 unspecified: Secondary | ICD-10-CM

## 2018-02-09 DIAGNOSIS — F411 Generalized anxiety disorder: Secondary | ICD-10-CM

## 2018-02-09 DIAGNOSIS — G4733 Obstructive sleep apnea (adult) (pediatric): Secondary | ICD-10-CM | POA: Diagnosis not present

## 2018-02-09 DIAGNOSIS — E8881 Metabolic syndrome: Secondary | ICD-10-CM

## 2018-02-09 DIAGNOSIS — Z9989 Dependence on other enabling machines and devices: Secondary | ICD-10-CM

## 2018-02-09 DIAGNOSIS — R918 Other nonspecific abnormal finding of lung field: Secondary | ICD-10-CM | POA: Diagnosis not present

## 2018-02-09 DIAGNOSIS — I10 Essential (primary) hypertension: Secondary | ICD-10-CM

## 2018-02-09 LAB — POCT CBC
Granulocyte percent: 52.8 %G (ref 37–80)
HCT, POC: 39.5 % (ref 37.7–47.9)
Hemoglobin: 12 g/dL — AB (ref 12.2–16.2)
LYMPH, POC: 2.7 (ref 0.6–3.4)
MCH: 27.8 pg (ref 27–31.2)
MCHC: 30.4 g/dL — AB (ref 31.8–35.4)
MCV: 91.3 fL (ref 80–97)
MID (CBC): 0.4 (ref 0–0.9)
MPV: 8.2 fL (ref 0–99.8)
PLATELET COUNT, POC: 271 10*3/uL (ref 142–424)
POC Granulocyte: 3.5 (ref 2–6.9)
POC LYMPH PERCENT: 40.7 %L (ref 10–50)
POC MID %: 6.5 %M (ref 0–12)
RBC: 4.33 M/uL (ref 4.04–5.48)
RDW, POC: 13.6 %
WBC: 6.6 10*3/uL (ref 4.6–10.2)

## 2018-02-09 LAB — POCT URINALYSIS DIP (MANUAL ENTRY)
BILIRUBIN UA: NEGATIVE mg/dL
Bilirubin, UA: NEGATIVE
Glucose, UA: NEGATIVE mg/dL
Leukocytes, UA: NEGATIVE
Nitrite, UA: NEGATIVE
PH UA: 5.5 (ref 5.0–8.0)
PROTEIN UA: NEGATIVE mg/dL
RBC UA: NEGATIVE
SPEC GRAV UA: 1.015 (ref 1.010–1.025)
UROBILINOGEN UA: 0.2 U/dL

## 2018-02-09 NOTE — Patient Instructions (Addendum)
I suspect the right shoulder mass is a muscle spasm/trigger point. Please go see [facility] about it ot see if they can work it out. If they can't, and it is still there in another month, please make an appointment with me asap for additional evaluation and to consider the next step in imaging of either CT or MRI.  If you have lab work done today you will be contacted with your lab results within the next 2 weeks.  If you have not heard from [place] then please contact us. The fastest way to get your results is to register for My Chart.   IF you received an x-ray today, you will receive an invoice from  [facility]. Please contact [facility] at [phone] with questions or concerns regarding your invoice.   IF you received labwork today, you will receive an invoice from [facility]. Please contact [facility] at [phone] with questions or concerns regarding your invoice.   Our billing staff will not be able to assist you with questions regarding bills from these companies.  You will be contacted with the lab results as soon as they are available. The fastest way to get your results is to activate your My Chart account. Instructions are located on the last page of this paperwork. If you have not heard from [place] regarding the results in 2 weeks, please contact this office.     Living With Anxiety After being diagnosed with an anxiety disorder, you may be relieved to know why you have felt or behaved a certain way. It is natural to also feel overwhelmed about the treatment ahead and what it will mean for your life. With care and support, you can manage this condition and recover from it. How to cope with anxiety Dealing with stress Stress is your body's reaction to life changes and events, both good and bad. Stress can last just a few hours or it can be ongoing. Stress can play a major role in anxiety, so it is important to learn both how to cope with stress and how to think about it  differently. Talk with your health care provider or a counselor to learn more about stress reduction. He or she may suggest some stress reduction techniques, such as:  Music therapy. This can include creating or listening to music that you enjoy and that inspires you.  Mindfulness-based meditation. This involves being aware of your normal breaths, rather than trying to control your breathing. It can be done while sitting or walking.  Centering prayer. This is a kind of meditation that involves focusing on a word, phrase, or sacred image that is meaningful to you and that brings you peace.  Deep breathing. To do this, expand your stomach and inhale slowly through your nose. Hold your breath for 3-5 seconds. Then exhale slowly, allowing your stomach muscles to relax.  Self-talk. This is a skill where you identify thought patterns that lead to anxiety reactions and correct those thoughts.  Muscle relaxation. This involves tensing muscles then relaxing them.  Choose a stress reduction technique that fits your lifestyle and personality. Stress reduction techniques take time and practice. Set aside 5-15 minutes a day to do them. Therapists can offer training in these techniques. The training may be covered by some insurance plans. Other things you can do to manage stress include:  Keeping a stress diary. This can help you learn what triggers your stress and ways to control your response.  Thinking about how you respond to certain situations. You  may not be able to control everything, but you can control your reaction.  Making time for activities that help you relax, and not feeling guilty about spending your time in this way.  Therapy combined with coping and stress-reduction skills provides the best chance for successful treatment. Medicines Medicines can help ease symptoms. Medicines for anxiety include:  Anti-anxiety drugs.  Antidepressants.  Beta-blockers.  Medicines may be used as the  main treatment for anxiety disorder, along with therapy, or if other treatments are not working. Medicines should be prescribed by a health care provider. Relationships Relationships can play a big part in helping you recover. Try to spend more time connecting with trusted friends and family members. Consider going to couples counseling, taking family education classes, or going to family therapy. Therapy can help you and others better understand the condition. How to recognize changes in your condition Everyone has a different response to treatment for anxiety. Recovery from anxiety happens when symptoms decrease and stop interfering with your daily activities at home or work. This may mean that you will start to:  Have better concentration and focus.  Sleep better.  Be less irritable.  Have more energy.  Have improved memory.  It is important to recognize when your condition is getting worse. Contact your health care provider if your symptoms interfere with home or work and you do not feel like your condition is improving. Where to find help and support: You can get help and support from these sources:  Self-help groups.  Online and [employer].  A trusted spiritual leader.  Couples counseling.  Family education classes.  Family therapy.  Follow these instructions at home:  Eat a healthy diet that includes plenty of vegetables, fruits, whole grains, low-fat dairy products, and lean protein. Do not eat a lot of foods that are high in solid fats, added sugars, or salt.  Exercise. Most adults should do the following: ? Exercise for at least 150 minutes each week. The exercise should increase your heart rate and make you sweat (moderate-intensity exercise). ? Strengthening exercises at least twice a week.  Cut down on caffeine, tobacco, alcohol, and other potentially harmful substances.  Get the right amount and quality of sleep. Most adults need 7-9 hours of sleep  each night.  Make choices that simplify your life.  Take over-the-counter and prescription medicines only as told by your health care provider.  Avoid caffeine, alcohol, and certain over-the-counter cold medicines. These may make you feel worse. Ask your pharmacist which medicines to avoid.  Keep all follow-up visits as told by your health care provider. This is important. Questions to ask your health care provider  Would I benefit from therapy?  How often should I follow up with a health care provider?  How long do I need to take medicine?  Are there any long-term side effects of my medicine?  Are there any alternatives to taking medicine? Contact a health care provider if:  You have a hard time staying focused or finishing daily tasks.  You spend many hours a day feeling worried about everyday life.  You become exhausted by worry.  You start to have headaches, feel tense, or have nausea.  You urinate more than normal.  You have diarrhea. Get help right away if:  You have a racing heart and shortness of breath.  You have thoughts of hurting yourself or others. If you ever feel like you may hurt yourself or others, or have thoughts about taking your own  life, get help right away. You can go to your nearest emergency department or call:  Your local emergency services (911 in the [place]  A suicide crisis helpline, such as the [facility] at (657)608-0947. This is open 24-hours a day.  Summary  Taking steps to deal with stress can help calm you.  Medicines cannot cure anxiety disorders, but they can help ease symptoms.  Family, friends, and partners can play a big part in helping you recover from an anxiety disorder. This information is not intended to replace advice given to you by your health care provider. Make sure you discuss any questions you have with your health care provider. Document Released: 05/23/2016 Document Revised: 05/23/2016  Document Reviewed: 05/23/2016 Elsevier Interactive Patient Education  [employer].

## 2018-02-10 LAB — LIPID PANEL
CHOL/HDL RATIO: 3.7 ratio (ref 0.0–4.4)
CHOLESTEROL TOTAL: 157 mg/dL (ref 100–199)
HDL: 43 mg/dL (ref >39)
LDL CALC: 76 mg/dL (ref 0–99)
Triglycerides: 188 mg/dL — ABNORMAL HIGH (ref 0–149)
VLDL Cholesterol Cal: 38 mg/dL (ref 5–40)

## 2018-02-10 LAB — BASIC METABOLIC PANEL
BUN / CREAT RATIO: 15 (ref 9–23)
BUN: 17 mg/dL (ref 6–24)
CO2: 25 mmol/L (ref 20–29)
CREATININE: 1.12 mg/dL — AB (ref 0.57–1.00)
Calcium: 9.8 mg/dL (ref 8.7–10.2)
Chloride: 100 mmol/L (ref 96–106)
GFR calc Af Amer: 64 mL/min/{1.73_m2} (ref >59)
GFR calc non Af Amer: 55 mL/min/{1.73_m2} — ABNORMAL LOW (ref >59)
GLUCOSE: 132 mg/dL — AB (ref 65–99)
Potassium: 4.5 mmol/L (ref 3.5–5.2)
SODIUM: 138 mmol/L (ref 134–144)

## 2018-02-10 LAB — TSH: TSH: 2.66 u[IU]/mL (ref 0.450–4.500)

## 2018-02-10 LAB — SEDIMENTATION RATE: SED RATE: 10 mm/h (ref 0–40)

## 2018-02-10 LAB — C-REACTIVE PROTEIN: CRP: 14 mg/L — AB (ref 0–10)

## 2018-02-13 ENCOUNTER — Telehealth: Payer: Self-pay | Admitting: Family Medicine

## 2018-02-13 NOTE — Telephone Encounter (Signed)
Patient had DR.  Adela Ports look at mass on R shoulder.He agrees a node or form lump. Not good news. Not muscular. Patient just keeping you in loop.

## 2018-02-19 ENCOUNTER — Other Ambulatory Visit: Payer: Self-pay | Admitting: Family Medicine

## 2018-02-19 ENCOUNTER — Telehealth: Payer: Self-pay | Admitting: Family Medicine

## 2018-02-19 DIAGNOSIS — F411 Generalized anxiety disorder: Secondary | ICD-10-CM

## 2018-02-19 NOTE — Telephone Encounter (Signed)
Alprazolan 1 mg  refill Last Refill:09/20/17 # 90 with 1 refill Last OV: 02/09/18 PCP: [place]: [place]  Controlled medication

## 2018-02-19 NOTE — Telephone Encounter (Signed)
Copied from [place] [id number]. Topic: Quick Communication - Rx Refill/Question >> Feb 19, 2018  3:19 PM Synthia Innocent wrote: Medication: torsemide (DEMADEX) 10 MG tablet, ALPRAZolam (XANAX) 1 MG tablet and hydrOXYzine (ATARAX/VISTARIL) 10 MG tablet   Has the patient contacted their pharmacy? Yes.    Preferred Pharmacy (with phone number or street name): [place]

## 2018-02-19 NOTE — Telephone Encounter (Signed)
Please advise 

## 2018-02-20 NOTE — Telephone Encounter (Signed)
Patient is requesting a refill of the following medications: Requested Prescriptions   Pending Prescriptions Disp Refills  . ALPRAZolam (XANAX) 1 MG tablet [Pharmacy Med Name: ALPRAZOLAM 1 MG TABLET] 90 tablet 0    Sig: TAKE ONE TABLET BY MOUTH THREE TIMES A DAY AS NEEDED FOR ANXIETY    Date of patient request: 02/19/2018 Last office visit: 02/09/2018 Date of last refill: 09/20/2017 Last refill amount:90 RF 1  Follow up time period per chart:

## 2018-02-22 ENCOUNTER — Encounter: Payer: Self-pay | Admitting: Family Medicine

## 2018-02-22 MED ORDER — HYDROXYZINE HCL 10 MG PO TABS: 10.0000 mg | ORAL_TABLET | Freq: Four times a day (QID) | ORAL | 1 refills | Status: DC | PRN

## 2018-02-22 MED ORDER — TORSEMIDE 10 MG PO TABS: 10.0000 mg | ORAL_TABLET | Freq: Every day | ORAL | 0 refills | Status: DC

## 2018-02-22 NOTE — Telephone Encounter (Signed)
Sent to pharmacy and pt notified over [place]

## 2018-02-22 NOTE — Telephone Encounter (Signed)
Refill sent in.  Today I have utilized the [place] Controlled Substance Registry's online query to confirm compliance regarding the patient's controlled medications. My review reveals appropriate prescription fills and that I am the sole provider of these medications. Rechecks will occur regularly and the patie  nt is aware of our use of the system. She is receiving hydrocodone 10 #90/mo and MSER 30mg  #90/mo from Dr. Juliette Alcide in [place] but getting ALL meds filled at same [facility] and using her medicare insurance. Also filled modafinil 200mg  #90 from Dr. Brett Fairy 5/10 and it has 3 additional refills on it which have not been filled.  Filled alprazolam 1mg  #90 that I wrote on 09/20/17 on 4/11 with refill on 7/23. No other BZDs.  We did discuss her increase freq of use at her last OV.

## 2018-02-23 ENCOUNTER — Encounter: Payer: Self-pay | Admitting: *Deleted

## 2018-02-28 ENCOUNTER — Ambulatory Visit (INDEPENDENT_AMBULATORY_CARE_PROVIDER_SITE_OTHER): Payer: Medicare Other | Admitting: Family Medicine

## 2018-02-28 ENCOUNTER — Encounter: Payer: Self-pay | Admitting: Family Medicine

## 2018-02-28 ENCOUNTER — Other Ambulatory Visit: Payer: Self-pay

## 2018-02-28 VITALS — BP 126/82 | HR 75 | Temp 98.9°F | Ht 65.0 in | Wt 300.0 lb

## 2018-02-28 DIAGNOSIS — R222 Localized swelling, mass and lump, trunk: Secondary | ICD-10-CM

## 2018-02-28 DIAGNOSIS — R221 Localized swelling, mass and lump, neck: Secondary | ICD-10-CM

## 2018-02-28 NOTE — Progress Notes (Signed)
9/19/201911:46 AM  Valerie [name] 07-Jun-1961, 57 y.o. female [id number]  Chief Complaint  Patient presents with  . Follow-up    has a supraclavicular mass on the right side base of the neck, Not causing  pain just tenderness and aching    HPI:   Patient is a 57 y.o. female with past medical history significant for per below who presents today for follow up on right sided supraclavicular mass  PCP Dr Brigitte Pulse First evaluated by PCP about 2 weeks ago CXR, CBC, esr, crp all normal Though benign wanted PT to comment as might be muscle spasm PT does not think it was muscle, it felt like a node First noted it night of 02/08/18 Reports night sweats, decreased appetite, recent cough and wheezing - responded well to albuterol, + nausea (has gastroparesis) Denies any fever, chills, hemoptysis, chest pain, palpitation, no vomiting, no changes to bowel movements, no unitentional weight loss, no blood in stool, no black tarry stools  Fall Risk  02/28/2018 02/09/2018 12/29/2017 12/15/2017 12/03/2017  Falls in the past year? No Yes No No No  Comment - - - - -  Number falls in past yr: - 1 - - -  Injury with Fall? - No - - -  Comment - - - - -     Depression screen [facility] 2/9 02/28/2018 02/09/2018 12/29/2017  Decreased Interest 0 0 0  Down, Depressed, Hopeless 0 0 0  PHQ - 2 Score 0 0 0  Altered sleeping - - -  Tired, decreased energy - - -  Change in appetite - - -  Feeling bad or failure about yourself  - - -  Trouble concentrating - - -  Moving slowly or fidgety/restless - - -  Suicidal thoughts - - -  PHQ-9 Score - - -  Difficult doing work/chores - - -  Some recent data might be hidden    Allergies  Allergen Reactions  . Dilaudid [Hydromorphone Hcl] Other (See Comments)    Headache   . Effexor [Venlafaxine]     pancreatitis  . Pregabalin Other (See Comments)    Drug induced stupor  . Metoclopramide Hcl Other (See Comments)    "crawling inside"  . Neurontin [Gabapentin] Other (See  Comments)    Tremors   . Other Other (See Comments)    Avoid all SSRI's and SNRI's- damaged pancreas  . Serotonin Reuptake Inhibitors (Ssris) Other (See Comments)    Damage to pancreas and bowel/urinary incontinence     Prior to Admission medications   Medication Sig Start Date End Date Taking? Authorizing Provider  albuterol (PROAIR HFA) 108 (90 Base) MCG/ACT inhaler Inhale 2 puffs every 4 (four) hours as needed into the lungs for wheezing or shortness of breath. 04/23/17  Yes Shawnee Knapp, MD  allopurinol (ZYLOPRIM) 300 MG tablet TAKE 1/2 TABLET BY MOUTH ONCE A DAY 12/31/17  Yes Shawnee Knapp, MD  ALPRAZolam Duanne Moron) 1 MG tablet TAKE ONE TABLET BY MOUTH THREE TIMES A DAY AS NEEDED FOR ANXIETY 02/22/18  Yes Shawnee Knapp, MD  baclofen (LIORESAL) 10 MG tablet Take 10 mg by mouth 3 (three) times daily. Takes as needed   Yes [provider]  benazepril (LOTENSIN) 20 MG tablet Take 1 tablet (20 mg total) daily by mouth. 04/23/17  Yes Shawnee Knapp, MD  calcium carbonate (OSCAL) 1500 (600 Ca) MG TABS tablet Take 1.5 tablets by mouth daily with breakfast.   Yes [provider]  cetirizine (ZYRTEC) 10 MG  tablet Take 10 mg by mouth daily. Alternates between Allegra and Zyrtec as needed for allergies   Yes [provider]  Cholecalciferol (VITAMIN D3) 5000 units CAPS Take 1 capsule by mouth.   Yes [provider]  dexlansoprazole (DEXILANT) 60 MG capsule Take 60 mg by mouth 2 (two) times daily.   Yes [provider]  fexofenadine (ALLEGRA) 180 MG tablet Take 180 mg by mouth daily as needed. Switches between Allegra and Zyrtec as needed for allergies   Yes [provider]  fluticasone (FLONASE) 50 MCG/ACT nasal spray PLACE 2 SPRAYS INTO BOTH NOSTRILS DAILY 10/04/17  Yes Shawnee Knapp, MD  fluticasone [facility]) 50 MCG/ACT nasal spray PLACE 2 SPRAYS INTO BOTH NOSTRILS DAILY 11/28/17  Yes Shawnee Knapp, MD  HYDROcodone-acetaminophen (NORCO) 10-325 MG per tablet Take  1-2 tablets by mouth every 6 (six) hours as needed for moderate pain (pain.).    Yes [provider]  hydrOXYzine (ATARAX/VISTARIL) 10 MG tablet Take 1-3 tablets (10-30 mg total) by mouth every 6 (six) hours as needed for anxiety. 02/22/18  Yes Shawnee Knapp, MD  ipratropium (ATROVENT) 0.03 % nasal spray Place 2 sprays 4 (four) times daily into the nose. 04/23/17  Yes Shawnee Knapp, MD  metoprolol succinate (TOPROL-XL) 100 MG 24 hr tablet Take 1 tablet (100 mg total) by mouth daily. Take with or immediately following a meal. 07/09/17  Yes Shawnee Knapp, MD  modafinil (PROVIGIL) 200 MG tablet Take 0.5 tablets (100 mg total) by mouth 2 (two) times daily. 10/11/17  Yes Dohmeier, Asencion Partridge, MD  montelukast (SINGULAIR) 10 MG tablet TAKE ONE TABLET BY MOUTH EVERY NIGHT AT BEDTIME 08/15/17  Yes Shawnee Knapp, MD  morphine (MS CONTIN) 30 MG 12 hr tablet Take 30 mg by mouth every 8 (eight) hours. Not from [place] 09/23/12  Yes [provider]  Multiple Vitamins-Minerals (MULTIVITAMIN PO) Take 1 tablet by mouth daily.   Yes [provider]  Olopatadine HCl (PAZEO) 0.7 % SOLN Apply 1 drop to eye daily. 07/09/17  Yes Shawnee Knapp, MD  ondansetron (ZOFRAN) 4 MG tablet Take 1 tablet (4 mg total) by mouth every 8 (eight) hours as needed for nausea or vomiting. 11/18/16  Yes Tereasa Coop, PA-C  Pancrelipase, Lip-Prot-Amyl, (CREON) 24000 UNITS CPEP Take 72,000 Units by mouth 3 (three) times daily as needed (for pancreatitis).    Yes [provider]  promethazine (PHENERGAN) 25 MG tablet Take 1 tablet (25 mg total) by mouth every 6 (six) hours as needed for nausea or vomiting. 11/18/16  Yes Tereasa Coop, PA-C  Pseudoephedrine HCl (SUDAFED PO) Take by mouth as needed.   Yes [provider]  spironolactone (ALDACTONE) 100 MG tablet Take 1 tablet (100 mg total) by mouth daily. 12/29/17  Yes Shawnee Knapp, MD  sucralfate (CARAFATE) 1 GM/10ML suspension Take 10 mLs (1 g total) by mouth 4 (four) times  daily as needed (stomach pain). 09/20/17  Yes Shawnee Knapp, MD  torsemide (DEMADEX) 10 MG tablet Take 1 tablet (10 mg total) by mouth daily. 02/22/18  Yes Shawnee Knapp, MD    Past Medical History:  Diagnosis Date  . Acute ischemic colitis ([place])   . Anemia 05/01/2012  . Anxiety   . ARF (acute renal failure) ([place]) 12/28/2015  . Arthritis    "basically ~ q joint" (12/28/2015)  . Asthma   . Basal cell carcinoma of upper back ~ 2011  . Chronic right-sided headaches  and neck pain, relieved wiht PT and chronic practic manipulation  . DDD (degenerative disc disease), lumbar   . Diarrhea    EPISODIC  . Fatigue    h/o excessive sedation, etiology unknown  . Fibromyalgia   . First degree heart block   . GERD (gastroesophageal reflux disease) 04/06/2017  . Headache(784.0)    "muscular w/migraine intensity; ~ 5-6 months" (12/28/2015)  . History of ischemic colitis 09/24/2016  . Hypertension   . Mental disorder    reports PTSD  . Microscopic polyangiitis ([place])   . Nerve pain 2011   nerve damage to right arm; normal EMG/NCV to RUExt after shoulder surgery 12/2009  . Pancreatitis    due to Effexor  . Pneumonia ~ 2014; 08/2015   Aspiration  . Polymyalgia rheumatica ([place]) onset 08/2015   changed diagnosis to MPA  . Precancerous melanosis ([place])    "2nd toe left foot; 5th toe of right foot; right temple; had them all cut off"  . Sleep apnea    CPAP-9    Past Surgical History:  Procedure Laterality Date  . BASAL CELL CARCINOMA EXCISION  ~ 2011   back  . BOTOX INJECTION N/A 11/06/2012   Procedure: BOTOX INJECTION;  Surgeon: Missy Sabins, MD;  Location: [facility];  Service: Endoscopy;  Laterality: N/A;  . BOTOX INJECTION N/A 12/04/2013   Procedure: BOTOX INJECTION;  Surgeon: Missy Sabins, MD;  Location: [facility];  Service: Endoscopy;  Laterality: N/A;  . BOTOX INJECTION N/A 02/26/2014   Procedure: BOTOX INJECTION;  Surgeon: Missy Sabins, MD;  Location: [facility];  Service: Endoscopy;   Laterality: N/A;  . BOTOX INJECTION N/A 07/08/2015   Procedure: BOTOX INJECTION;  Surgeon: Teena Irani, MD;  Location: [facility];  Service: Endoscopy;  Laterality: N/A;  . BOTOX INJECTION N/A 04/27/2016   Procedure: BOTOX INJECTION;  Surgeon: Teena Irani, MD;  Location: [facility];  Service: Endoscopy;  Laterality: N/A;  . BOTOX INJECTION N/A 04/09/2017   Procedure: BOTOX INJECTION;  Surgeon: Clarene Essex, MD;  Location: [facility];  Service: Endoscopy;  Laterality: N/A;  . ESOPHAGOGASTRODUODENOSCOPY  12/21/2011   Procedure: ESOPHAGOGASTRODUODENOSCOPY (EGD);  Surgeon: Missy Sabins, MD;  Location: [facility];  Service: Endoscopy;  Laterality: N/A;  . ESOPHAGOGASTRODUODENOSCOPY N/A 11/06/2012   Procedure: ESOPHAGOGASTRODUODENOSCOPY (EGD);  Surgeon: Missy Sabins, MD;  Location: [facility];  Service: Endoscopy;  Laterality: N/A;  . ESOPHAGOGASTRODUODENOSCOPY N/A 12/04/2013   Procedure: ESOPHAGOGASTRODUODENOSCOPY (EGD);  Surgeon: Missy Sabins, MD;  [facility];  Service: Endoscopy;  Laterality: N/A;  . ESOPHAGOGASTRODUODENOSCOPY N/A 02/26/2014   Procedure: ESOPHAGOGASTRODUODENOSCOPY (EGD);  Surgeon: Missy Sabins, MD;  [facility];  Service: Endoscopy;  Laterality: N/A;  . ESOPHAGOGASTRODUODENOSCOPY N/A 04/09/2017   Procedure: ESOPHAGOGASTRODUODENOSCOPY (EGD);  Surgeon: Clarene Essex, MD;  Location: [place];  Service: Endoscopy;  Laterality: N/A;  . ESOPHAGOGASTRODUODENOSCOPY (EGD) WITH PROPOFOL N/A 07/08/2015   Procedure: ESOPHAGOGASTRODUODENOSCOPY (EGD) WITH PROPOFOL;  Surgeon: Teena Irani, MD;  [facility];  Service: Endoscopy;  Laterality: N/A;  . ESOPHAGOGASTRODUODENOSCOPY (EGD) WITH PROPOFOL N/A 04/27/2016   Procedure: ESOPHAGOGASTRODUODENOSCOPY (EGD) WITH PROPOFOL;  Surgeon: Teena Irani, MD;  Location: [facility];  Service: Endoscopy;  Laterality: N/A;  . FLEXIBLE SIGMOIDOSCOPY  05/01/2012   Procedure: FLEXIBLE SIGMOIDOSCOPY;  Surgeon: Jeryl Columbia, MD;   Location: [facility];  Service: Endoscopy;  Laterality: N/A;  unprepped  . FLEXIBLE SIGMOIDOSCOPY Left 09/28/2016   Procedure: FLEXIBLE SIGMOIDOSCOPY;  Surgeon: Wilford Corner, MD;  Location: [facility];  Service: Endoscopy;  Laterality: Left;  . LAPAROSCOPIC CHOLECYSTECTOMY  01/2002  . NASAL SEPTOPLASTY W/ TURBINOPLASTY  1998  . SHOULDER ARTHROSCOPY WITH ROTATOR CUFF REPAIR AND OPEN BICEPS TENODESIS Bilateral 2010-2013   left-right    Social History   Tobacco Use  . Smoking status: Never Smoker  . Smokeless tobacco: Never Used  Substance Use Topics  . Alcohol use: No    Family History  Problem Relation Age of Onset  . Heart disease Mother   . COPD Mother   . Arthritis Mother   . Cancer Mother   . Hypertension Mother   . Arthritis Paternal Grandmother   . Heart disease Paternal Grandmother   . Heart disease Paternal Grandfather     ROS Per hpi  OBJECTIVE:  Blood pressure 126/82, pulse 75, temperature 98.9 F (37.2 C), temperature source Oral, height 5' 5"  (1.651 m), weight 300 lb (136.1 kg), SpO2 96 %. Body mass index is 49.92 kg/m.   Wt Readings from Last 3 Encounters:  02/28/18 300 lb (136.1 kg)  02/09/18 299 lb 6.4 oz (135.8 kg)  12/29/17 297 lb 3.2 oz (134.8 kg)    Physical Exam  Constitutional: She appears well-developed and well-nourished.  HENT:  Head: Normocephalic and atraumatic.  Pulmonary/Chest: Right breast exhibits no inverted nipple, no mass, no nipple discharge and no skin change. Left breast exhibits no inverted nipple, no mass, no nipple discharge and no skin change.  Lymphadenopathy:    She has no cervical adenopathy.    She has no axillary adenopathy.  Right supraclavicular fossa with a firm mobile 1cm mass  Vitals reviewed.   ASSESSMENT and PLAN  1. Subcutaneous mass of supraclavicular area followup pending results - US SOFT TISSUE NECK; Future - [place] CHEST SOFT TISSUE; Future    Return pending [place] results.    Rutherford Guys, MD Primary Care at [place], [place] [address] Ph.  [phone] Fax [phone]

## 2018-02-28 NOTE — Patient Instructions (Signed)
° ° ° °  If you have lab work done today you will be contacted with your lab results within the next 2 weeks.  If you have not heard from us then please contact us. The fastest way to get your results is to register for My Chart. ° ° °IF you received an x-ray today, you will receive an invoice from [facility]. Please contact [facility] at [phone] with questions or concerns regarding your invoice.  ° °IF you received labwork today, you will receive an invoice from [facility]. Please contact [facility] at [phone] with questions or concerns regarding your invoice.  ° °Our billing staff will not be able to assist you with questions regarding bills from these companies. ° °You will be contacted with the lab results as soon as they are available. The fastest way to get your results is to activate your My Chart account. Instructions are located on the last page of this paperwork. If you have not heard from us regarding the results in 2 weeks, please contact this office. °  ° ° ° °

## 2018-03-06 ENCOUNTER — Ambulatory Visit
Admission: RE | Admit: 2018-03-06 | Discharge: 2018-03-06 | Disposition: A | Discharge status: Home / Self Care | Payer: Medicare Other | Source: Ambulatory Visit | Attending: Family Medicine | Admitting: Family Medicine

## 2018-03-06 DIAGNOSIS — R222 Localized swelling, mass and lump, trunk: Secondary | ICD-10-CM

## 2018-03-06 DIAGNOSIS — R221 Localized swelling, mass and lump, neck: Secondary | ICD-10-CM | POA: Diagnosis not present

## 2018-03-07 ENCOUNTER — Telehealth: Payer: Self-pay | Admitting: *Deleted

## 2018-03-07 ENCOUNTER — Encounter: Payer: Self-pay | Admitting: Family Medicine

## 2018-03-07 NOTE — Addendum Note (Signed)
Addended by: Rutherford Guys on: 03/07/2018 01:41 PM   Modules accepted: Orders

## 2018-03-07 NOTE — Telephone Encounter (Signed)
I have called the patient and she stated that she was aware that she has a CT scheduled and an appointment scheduled with Dr. Pamella Pert afterwards, however she wants to know what is being suspected. Pt stated that she has high anxiety and not knowing something is better than knowing something bad. Please advise. Pt is very emotional and would like some answers prior to the CT if at all possible.  Thanks

## 2018-03-07 NOTE — Telephone Encounter (Signed)
Please advise patient that she has a CT scheduled for Monday at 4;30  Nothing to eat 4 hours prior to exam   [place] [facility].   Please schedule an appointment with Dr. Pamella Pert for after the appointment.

## 2018-03-07 NOTE — Telephone Encounter (Signed)
Disclosed below information to the patient and she would like a call to discuss why more testing is needing to be done.

## 2018-03-08 ENCOUNTER — Telehealth: Payer: Self-pay | Admitting: Family Medicine

## 2018-03-08 ENCOUNTER — Encounter: Payer: Self-pay | Admitting: Family Medicine

## 2018-03-08 NOTE — Telephone Encounter (Signed)
Patient has been scheduled to see me on 10/1, day after her CT scan

## 2018-03-08 NOTE — Telephone Encounter (Signed)
Called pt and advised her of the appts that we had available for after her CT scan on Monday. I explained that I was not sure that the results would be in. Pt stated that there was no reason to make an appt then. The phone was hung up on me and I went to talk to Dr. Pamella Pert to see what her thoughts were on seeing her on Tuesday (would the results be in). Dr. Pamella Pert instructed me to make the latest appt that she had available on Tuesday 10/1. I made the appt for 5:20 on 10/1. Pt was advised that we could not promise that the results would be in.

## 2018-03-11 ENCOUNTER — Ambulatory Visit
Admission: RE | Admit: 2018-03-11 | Discharge: 2018-03-11 | Disposition: A | Discharge status: Home / Self Care | Payer: Medicare Other | Source: Ambulatory Visit | Attending: Family Medicine | Admitting: Family Medicine

## 2018-03-11 DIAGNOSIS — R221 Localized swelling, mass and lump, neck: Secondary | ICD-10-CM | POA: Diagnosis not present

## 2018-03-11 MED ORDER — IOPAMIDOL (ISOVUE-300) INJECTION 61%
75.0000 mL | Freq: Once | INTRAVENOUS | Status: AC | PRN
  Administered 2018-03-11: 75 mL via INTRAVENOUS

## 2018-03-12 ENCOUNTER — Encounter: Payer: Self-pay | Admitting: Family Medicine

## 2018-03-12 ENCOUNTER — Ambulatory Visit (INDEPENDENT_AMBULATORY_CARE_PROVIDER_SITE_OTHER): Payer: Medicare Other | Admitting: Family Medicine

## 2018-03-12 VITALS — BP 132/82 | HR 70 | Temp 98.1°F | Ht 65.0 in | Wt 301.2 lb

## 2018-03-12 DIAGNOSIS — M25511 Pain in right shoulder: Secondary | ICD-10-CM | POA: Diagnosis not present

## 2018-03-12 DIAGNOSIS — G8929 Other chronic pain: Secondary | ICD-10-CM

## 2018-03-12 DIAGNOSIS — M7551 Bursitis of right shoulder: Secondary | ICD-10-CM

## 2018-03-12 NOTE — Patient Instructions (Signed)
° ° ° °  If you have lab work done today you will be contacted with your lab results within the next 2 weeks.  If you have not heard from us then please contact us. The fastest way to get your results is to register for My Chart. ° ° °IF you received an x-ray today, you will receive an invoice from [facility]. Please contact  Radiology at [phone] with questions or concerns regarding your invoice.  ° °IF you received labwork today, you will receive an invoice from [facility]. Please contact [facility] at [phone] with questions or concerns regarding your invoice.  ° °Our billing staff will not be able to assist you with questions regarding bills from these companies. ° °You will be contacted with the lab results as soon as they are available. The fastest way to get your results is to activate your My Chart account. Instructions are located on the last page of this paperwork. If you have not heard from us regarding the results in 2 weeks, please contact this office. °  ° ° ° °

## 2018-03-12 NOTE — Progress Notes (Signed)
10/1/20196:00 PM  Valerie [name] 07-23-1960, 57 y.o. female [id number]  Chief Complaint  Patient presents with  . Follow-up    follwo up on mask, has ct and ultra sound done    HPI:   Patient is a 57 y.o. female  who presents today for followup on CT neck  She has h/o bilateral rotator cuff and biceps tendons repairs For past several weeks has been having different type of shoulder pain, achiness behind the scapula and along proximal clavicle.  Cant get arm comfortable for sleep  Her shoulder surgeon has retired  Fall Risk  03/12/2018 02/28/2018 02/09/2018 12/29/2017 12/15/2017  Falls in the past year? No No Yes No No  Comment - - - - -  Number falls in past yr: - - 1 - -  Injury with Fall? - - No - -  Comment - - - - -     Depression screen [facility] 2/9 03/12/2018 02/28/2018 02/09/2018  Decreased Interest 0 0 0  Down, Depressed, Hopeless 0 0 0  PHQ - 2 Score 0 0 0  Altered sleeping - - -  Tired, decreased energy - - -  Change in appetite - - -  Feeling bad or failure about yourself  - - -  Trouble concentrating - - -  Moving slowly or fidgety/restless - - -  Suicidal thoughts - - -  PHQ-9 Score - - -  Difficult doing work/chores - - -  Some recent data might be hidden    Allergies  Allergen Reactions  . Dilaudid [Hydromorphone Hcl] Other (See Comments)    Headache   . Effexor [Venlafaxine]     pancreatitis  . Pregabalin Other (See Comments)    Drug induced stupor  . Metoclopramide Hcl Other (See Comments)    "crawling inside"  . Neurontin [Gabapentin] Other (See Comments)    Tremors   . Other Other (See Comments)    Avoid all SSRI's and SNRI's- damaged pancreas  . Serotonin Reuptake Inhibitors (Ssris) Other (See Comments)    Damage to pancreas and bowel/urinary incontinence     Prior to Admission medications   Medication Sig Start Date End Date Taking? Authorizing Provider  albuterol (PROAIR HFA) 108 (90 Base) MCG/ACT inhaler Inhale 2 puffs every 4 (four)  hours as needed into the lungs for wheezing or shortness of breath. 04/23/17  Yes Shawnee Knapp, MD  allopurinol (ZYLOPRIM) 300 MG tablet TAKE 1/2 TABLET BY MOUTH ONCE A DAY 12/31/17  Yes Shawnee Knapp, MD  ALPRAZolam Duanne Moron) 1 MG tablet TAKE ONE TABLET BY MOUTH THREE TIMES A DAY AS NEEDED FOR ANXIETY 02/22/18  Yes Shawnee Knapp, MD  baclofen (LIORESAL) 10 MG tablet Take 10 mg by mouth 3 (three) times daily. Takes as needed   Yes [provider]  benazepril (LOTENSIN) 20 MG tablet Take 1 tablet (20 mg total) daily by mouth. 04/23/17  Yes Shawnee Knapp, MD  calcium carbonate (OSCAL) 1500 (600 Ca) MG TABS tablet Take 0.5 tablets by mouth daily with breakfast.   Yes [provider]  cetirizine (ZYRTEC) 10 MG tablet Take 10 mg by mouth daily. Alternates between Allegra and Zyrtec as needed for allergies   Yes [provider]  Cholecalciferol (VITAMIN D3) 5000 units CAPS Take 1 capsule by mouth.   Yes [provider]  dexlansoprazole (DEXILANT) 60 MG capsule Take 60 mg by mouth 2 (two) times daily.   Yes [provider]  fexofenadine (ALLEGRA) 180 MG tablet Take  180 mg by mouth daily as needed. Switches between Allegra and Zyrtec as needed for allergies   Yes [provider]  fluticasone (FLONASE) 50 MCG/ACT nasal spray PLACE 2 SPRAYS INTO BOTH NOSTRILS DAILY 10/04/17  Yes Shawnee Knapp, MD  fluticasone [facility]) 50 MCG/ACT nasal spray PLACE 2 SPRAYS INTO BOTH NOSTRILS DAILY 11/28/17  Yes Shawnee Knapp, MD  HYDROcodone-acetaminophen (NORCO) 10-325 MG per tablet Take 1-2 tablets by mouth every 6 (six) hours as needed for moderate pain (pain.).    Yes [provider]  hydrOXYzine (ATARAX/VISTARIL) 10 MG tablet Take 1-3 tablets (10-30 mg total) by mouth every 6 (six) hours as needed for anxiety. 02/22/18  Yes Shawnee Knapp, MD  ipratropium (ATROVENT) 0.03 % nasal spray Place 2 sprays 4 (four) times daily into the nose. 04/23/17  Yes Shawnee Knapp, MD  metoprolol  succinate (TOPROL-XL) 100 MG 24 hr tablet Take 1 tablet (100 mg total) by mouth daily. Take with or immediately following a meal. 07/09/17  Yes Shawnee Knapp, MD  modafinil (PROVIGIL) 200 MG tablet Take 0.5 tablets (100 mg total) by mouth 2 (two) times daily. 10/11/17  Yes Dohmeier, Asencion Partridge, MD  montelukast (SINGULAIR) 10 MG tablet TAKE ONE TABLET BY MOUTH EVERY NIGHT AT BEDTIME 08/15/17  Yes Shawnee Knapp, MD  morphine (MS CONTIN) 30 MG 12 hr tablet Take 30 mg by mouth every 8 (eight) hours. Not from [place] 09/23/12  Yes [provider]  Multiple Vitamins-Minerals (MULTIVITAMIN PO) Take 1 tablet by mouth daily.   Yes [provider]  Olopatadine HCl (PAZEO) 0.7 % SOLN Apply 1 drop to eye daily. 07/09/17  Yes Shawnee Knapp, MD  ondansetron (ZOFRAN) 4 MG tablet Take 1 tablet (4 mg total) by mouth every 8 (eight) hours as needed for nausea or vomiting. 11/18/16  Yes Tereasa Coop, PA-C  Pancrelipase, Lip-Prot-Amyl, (CREON) 24000 UNITS CPEP Take 72,000 Units by mouth 3 (three) times daily as needed (for pancreatitis).    Yes [provider]  promethazine (PHENERGAN) 25 MG tablet Take 1 tablet (25 mg total) by mouth every 6 (six) hours as needed for nausea or vomiting. 11/18/16  Yes Tereasa Coop, PA-C  Pseudoephedrine HCl (SUDAFED PO) Take by mouth as needed.   Yes [provider]  spironolactone (ALDACTONE) 100 MG tablet Take 1 tablet (100 mg total) by mouth daily. 12/29/17  Yes Shawnee Knapp, MD  sucralfate (CARAFATE) 1 GM/10ML suspension Take 10 mLs (1 g total) by mouth 4 (four) times daily as needed (stomach pain). 09/20/17  Yes Shawnee Knapp, MD  torsemide (DEMADEX) 10 MG tablet Take 1 tablet (10 mg total) by mouth daily. 02/22/18  Yes Shawnee Knapp, MD    Past Medical History:  Diagnosis Date  . Acute ischemic colitis ([place])   . Anemia 05/01/2012  . Anxiety   . ARF (acute renal failure) ([place]) 12/28/2015  . Arthritis    "basically ~ q joint" (12/28/2015)  . Asthma   . Basal cell  carcinoma of upper back ~ 2011  . Chronic right-sided headaches    and neck pain, relieved wiht PT and chronic practic manipulation  . DDD (degenerative disc disease), lumbar   . Diarrhea    EPISODIC  . Fatigue    h/o excessive sedation, etiology unknown  . Fibromyalgia   . First degree heart block   . GERD (gastroesophageal reflux disease) 04/06/2017  . Headache(784.0)    "muscular w/migraine intensity; ~ 5-6 months" (12/28/2015)  .  History of ischemic colitis 09/24/2016  . Hypertension   . Mental disorder    reports PTSD  . Microscopic polyangiitis ([place])   . Nerve pain 2011   nerve damage to right arm; normal EMG/NCV to RUExt after shoulder surgery 12/2009  . Pancreatitis    due to Effexor  . Pneumonia ~ 2014; 08/2015   Aspiration  . Polymyalgia rheumatica ([place]) onset 08/2015   changed diagnosis to MPA  . Precancerous melanosis ([place])    "2nd toe left foot; 5th toe of right foot; right temple; had them all cut off"  . Sleep apnea    CPAP-9    Past Surgical History:  Procedure Laterality Date  . BASAL CELL CARCINOMA EXCISION  ~ 2011   back  . BOTOX INJECTION N/A 11/06/2012   Procedure: BOTOX INJECTION;  Surgeon: Missy Sabins, MD;  Location: [facility];  Service: Endoscopy;  Laterality: N/A;  . BOTOX INJECTION N/A 12/04/2013   Procedure: BOTOX INJECTION;  Surgeon: Missy Sabins, MD;  Location: [place];  Service: Endoscopy;  Laterality: N/A;  . BOTOX INJECTION N/A 02/26/2014   Procedure: BOTOX INJECTION;  Surgeon: Missy Sabins, MD;  Location: [place];  Service: Endoscopy;  Laterality: N/A;  . BOTOX INJECTION N/A 07/08/2015   Procedure: BOTOX INJECTION;  Surgeon: Teena Irani, MD;  Location: [facility];  Service: Endoscopy;  Laterality: N/A;  . BOTOX INJECTION N/A 04/27/2016   Procedure: BOTOX INJECTION;  Surgeon: Teena Irani, MD;  Location: [facility];  Service: Endoscopy;  Laterality: N/A;  . BOTOX INJECTION N/A 04/09/2017   Procedure: BOTOX INJECTION;  Surgeon:  Clarene Essex, MD;  Location: [place];  Service: Endoscopy;  Laterality: N/A;  . ESOPHAGOGASTRODUODENOSCOPY  12/21/2011   Procedure: ESOPHAGOGASTRODUODENOSCOPY (EGD);  Surgeon: Missy Sabins, MD;  Location: [facility] ENDOSCOPY;  Service: Endoscopy;  Laterality: N/A;  . ESOPHAGOGASTRODUODENOSCOPY N/A 11/06/2012   Procedure: ESOPHAGOGASTRODUODENOSCOPY (EGD);  Surgeon: Missy Sabins, MD;  Location: [facility];  Service: Endoscopy;  Laterality: N/A;  . ESOPHAGOGASTRODUODENOSCOPY N/A 12/04/2013   Procedure: ESOPHAGOGASTRODUODENOSCOPY (EGD);  Surgeon: Missy Sabins, MD;  Location: [facility] ENDOSCOPY;  Service: Endoscopy;  Laterality: N/A;  . ESOPHAGOGASTRODUODENOSCOPY N/A 02/26/2014   Procedure: ESOPHAGOGASTRODUODENOSCOPY (EGD);  Surgeon: Missy Sabins, MD;  Location: [facility] ENDOSCOPY;  Service: Endoscopy;  Laterality: N/A;  . ESOPHAGOGASTRODUODENOSCOPY N/A 04/09/2017   Procedure: ESOPHAGOGASTRODUODENOSCOPY (EGD);  Surgeon: Clarene Essex, MD;  Location: [place];  Service: Endoscopy;  Laterality: N/A;  . ESOPHAGOGASTRODUODENOSCOPY (EGD) WITH PROPOFOL N/A 07/08/2015   Procedure: ESOPHAGOGASTRODUODENOSCOPY (EGD) WITH PROPOFOL;  Surgeon: Teena Irani, MD;  [facility];  Service: Endoscopy;  Laterality: N/A;  . ESOPHAGOGASTRODUODENOSCOPY (EGD) WITH PROPOFOL N/A 04/27/2016   Procedure: ESOPHAGOGASTRODUODENOSCOPY (EGD) WITH PROPOFOL;  Surgeon: Teena Irani, MD;  [facility];  Service: Endoscopy;  Laterality: N/A;  . FLEXIBLE SIGMOIDOSCOPY  05/01/2012   Procedure: FLEXIBLE SIGMOIDOSCOPY;  Surgeon: Jeryl Columbia, MD;  Location: [facility];  Service: Endoscopy;  Laterality: N/A;  unprepped  . FLEXIBLE SIGMOIDOSCOPY Left 09/28/2016   Procedure: FLEXIBLE SIGMOIDOSCOPY;  Surgeon: Wilford Corner, MD;  [facility];  Service: Endoscopy;  Laterality: Left;  . LAPAROSCOPIC CHOLECYSTECTOMY  01/2002  . NASAL SEPTOPLASTY W/ TURBINOPLASTY  1998  . SHOULDER ARTHROSCOPY WITH ROTATOR CUFF REPAIR AND OPEN BICEPS  TENODESIS Bilateral 2010-2013   left-right    Social History   Tobacco Use  . Smoking status: Never Smoker  . Smokeless tobacco: Never Used  Substance Use Topics  . Alcohol use: No    Family History  Problem Relation Age of Onset  . Heart disease Mother   . COPD Mother   . Arthritis Mother   . Cancer Mother   . Hypertension Mother   . Arthritis Paternal Grandmother   . Heart disease Paternal Grandmother   . Heart disease Paternal Grandfather     ROS Per hpi  OBJECTIVE:  Blood pressure 132/82, pulse 70, temperature 98.1 F (36.7 C), temperature source Oral, height 5\' 5"  (1.651 m), weight (!) 301 lb 3.2 oz (136.6 kg), SpO2 96 %. Body mass index is 50.12 kg/m.   Physical Exam  Gen: AAox3, NAD  No results found for this or any previous visit (from the past 24 hour(s)).  Ct Soft Tissue Neck W Contrast  Addendum Date: 03/12/2018   ADDENDUM REPORT: 03/12/2018 04:51 IMPRESSION: 1. No acute process in the neck. 2. 1.6 x 3.1 cm cyst/bursal collection arising from RIGHT AC joint within RIGHT trapezius muscle corresponding to palpable abnormality. Electronically Signed   By: Elon Alas M.D.   On: 03/12/2018 04:51   Result Date: 03/12/2018 CLINICAL DATA:  RIGHT clavicle and arm pain for 1 month. History of cyst upper back skin cancer. EXAM: CT NECK WITH CONTRAST TECHNIQUE: Multidetector CT imaging of the neck was performed using the standard protocol following the bolus administration of intravenous contrast. CONTRAST:  57mL ISOVUE-300 IOPAMIDOL (ISOVUE-300) INJECTION 61% COMPARISON:  None. FINDINGS: PHARYNX AND LARYNX: Normal.  Widely patent airway. SALIVARY GLANDS: Normal. THYROID: Normal. LYMPH NODES: No lymphadenopathy by CT size criteria. VASCULAR: Normal. LIMITED INTRACRANIAL: Normal. VISUALIZED ORBITS: Normal. MASTOIDS AND VISUALIZED PARANASAL SINUSES: Well-aerated. Hypoplastic RIGHT maxillary sinus. SKELETON: Nonacute. UPPER CHEST: Lung apices are clear. No superior  mediastinal lymphadenopathy. OTHER: RIGHT humeral head suture anchor. Widened RIGHT AC joint with fragmentation, associated teardrop shaped 1.6 x 3.1 cm cyst extending into RIGHT trapezius muscle. Electronically Signed: By: Elon Alas M.D. On: 03/12/2018 04:47     ASSESSMENT and PLAN  1. Chronic right shoulder pain 2. Bursitis of shoulder, right  Patient will get back to me regarding which orthopedist she wants to see   Return if symptoms worsen or fail to improve.    Rutherford Guys, MD Primary Care at [facility], [place] [address] Ph.  [phone] Fax [phone]

## 2018-03-13 ENCOUNTER — Encounter: Payer: Self-pay | Admitting: Family Medicine

## 2018-03-13 DIAGNOSIS — M25511 Pain in right shoulder: Principal | ICD-10-CM

## 2018-03-13 DIAGNOSIS — M7551 Bursitis of right shoulder: Secondary | ICD-10-CM

## 2018-03-13 DIAGNOSIS — G8929 Other chronic pain: Secondary | ICD-10-CM

## 2018-03-13 DIAGNOSIS — R222 Localized swelling, mass and lump, trunk: Secondary | ICD-10-CM

## 2018-03-14 DIAGNOSIS — M542 Cervicalgia: Secondary | ICD-10-CM | POA: Diagnosis not present

## 2018-03-17 DIAGNOSIS — Z23 Encounter for immunization: Secondary | ICD-10-CM | POA: Diagnosis not present

## 2018-03-18 DIAGNOSIS — M25511 Pain in right shoulder: Secondary | ICD-10-CM | POA: Diagnosis not present

## 2018-03-19 ENCOUNTER — Other Ambulatory Visit: Payer: Self-pay | Admitting: Orthopedic Surgery

## 2018-03-19 DIAGNOSIS — M25511 Pain in right shoulder: Secondary | ICD-10-CM

## 2018-03-20 ENCOUNTER — Ambulatory Visit
Admission: RE | Admit: 2018-03-20 | Discharge: 2018-03-20 | Disposition: A | Discharge status: Home / Self Care | Payer: Medicare Other | Source: Ambulatory Visit | Attending: Orthopedic Surgery | Admitting: Orthopedic Surgery

## 2018-03-20 DIAGNOSIS — M75121 Complete rotator cuff tear or rupture of right shoulder, not specified as traumatic: Secondary | ICD-10-CM | POA: Diagnosis not present

## 2018-03-20 DIAGNOSIS — M25511 Pain in right shoulder: Secondary | ICD-10-CM | POA: Diagnosis not present

## 2018-03-20 MED ORDER — IOPAMIDOL (ISOVUE-M 200) INJECTION 41%
15.0000 mL | Freq: Once | INTRAMUSCULAR | Status: AC
  Administered 2018-03-20: 15 mL via INTRA_ARTICULAR

## 2018-03-21 ENCOUNTER — Ambulatory Visit: Payer: Medicare Other | Admitting: Family Medicine

## 2018-03-25 ENCOUNTER — Encounter: Payer: Self-pay | Admitting: Family Medicine

## 2018-03-28 ENCOUNTER — Telehealth: Payer: Self-pay | Admitting: Family Medicine

## 2018-03-28 NOTE — Telephone Encounter (Signed)
Called and spoke with pt regarding their appt with Dr. Brigitte Pulse on 04/18/18. Due to her schedule changing, we need the pt to come in 10 minutes earlier than the scheduled time.   OLD APPT: 8:40 NEW APPT: 8:30  Thank you!

## 2018-03-29 DIAGNOSIS — M25511 Pain in right shoulder: Secondary | ICD-10-CM | POA: Diagnosis not present

## 2018-04-03 ENCOUNTER — Other Ambulatory Visit: Payer: Self-pay | Admitting: Orthopedic Surgery

## 2018-04-04 NOTE — Progress Notes (Signed)
Spoke with anesthesiologist Dr. Royce Macadamia about patient being appropriate for [facility]. With patient having a BMI of 50.12 and comorbidity's of HTN, asthma and sleep apnea. Dr. Royce Macadamia stated she is not an appropriate candidate for the surgery center, and taht her surgery needed to be done in the main OR.Left message with Nickola Major surgery scheduler at Dr. Luanna Cole office to make them aware. Awaiting return call.

## 2018-04-05 NOTE — Progress Notes (Signed)
02/09/2018- noted in Epic-CXR 2 view  09/28/2017- noted in Epic and on chart-EKG

## 2018-04-05 NOTE — Patient Instructions (Addendum)
Valerie [name]  04/05/2018   Your procedure is scheduled on: Tuesday 04/09/2018  Report to [facility] Main  Entrance  Report to admitting at  1115 AM    Call this number if you have problems the morning of surgery [phone]    Remember: Do not eat food  :After Midnight.  May have clear liquids from 12 midnight up until 0715 am then nothing until after surgery.                         Bring CPAP mask and tubing with you to the hospital the morning of surgery!   CLEAR LIQUID DIET   Foods Allowed                                                                     Foods Excluded  Coffee and tea, regular and decaf                             liquids that you cannot  Plain Jell-O in any flavor                                             see through such as: Fruit ices (not with fruit pulp)                                     milk, soups, orange juice  Iced Popsicles                                    All solid food Carbonated beverages, regular and diet                                    Cranberry, grape and apple juices Sports drinks like Gatorade Lightly seasoned clear broth or consume(fat free) Sugar, honey syrup  Sample Menu Breakfast                                Lunch                                     Supper Cranberry juice                    Beef broth                            Chicken broth Jell-O  Grape juice                           Apple juice Coffee or tea                        Jell-O                                      Popsicle                                                Coffee or tea                        Coffee or tea  _____________________________________________________________________                BRUSH YOUR TEETH MORNING OF SURGERY AND RINSE YOUR MOUTH OUT, NO CHEWING GUM CANDY OR MINTS.     Take these medicines the morning of surgery with A SIP OF WATER: Dexlansoprazole   (Dexilant), Metoprolol Succinate( Toprol XL),  Zyrtec ,use Albuterol inhaler if needed and bring it with you to the hospital                                You may not have any metal on your body including hair pins and              piercings  Do not wear jewelry, make-up, lotions, powders or perfumes, deodorant             Do not wear nail polish.  Do not shave  48 hours prior to surgery.                Do not bring valuables to the hospital. [place].  Contacts, dentures or bridgework may not be worn into surgery.  Leave suitcase in the car. After surgery it may be brought to your room.     Patients discharged the day of surgery will not be allowed to drive home.  Name and phone number of your driver:  Still working on transportation if going home day of surgery               Please read over the following fact sheets you were given: _____________________________________________________________________             [facility] - Preparing for Surgery Before surgery, you can play an important role.  Because skin is not sterile, your skin needs to be as free of germs as possible.  You can reduce the number of germs on your skin by washing with CHG (chlorahexidine gluconate) soap before surgery.  CHG is an antiseptic cleaner which kills germs and bonds with the skin to continue killing germs even after washing. Please DO NOT use if you have an allergy to CHG or antibacterial soaps.  If your skin becomes reddened/irritated stop using the CHG and inform your nurse when you arrive at Short Stay. Do not shave (including legs and underarms) for at least 48 hours  prior to the first CHG shower.  You may shave your face/neck. Please follow these instructions carefully:  1.  Shower with CHG Soap the night before surgery and the  morning of Surgery.  2.  If you choose to wash your hair, wash your hair first as usual with your  normal  shampoo.  3.   After you shampoo, rinse your hair and body thoroughly to remove the  shampoo.                           4.  Use CHG as you would any other liquid soap.  You can apply chg directly  to the skin and wash                       Gently with a scrungie or clean washcloth.  5.  Apply the CHG Soap to your body ONLY FROM THE NECK DOWN.   Do not use on face/ open                           Wound or open sores. Avoid contact with eyes, ears mouth and genitals (private parts).                       Wash face,  Genitals (private parts) with your normal soap.             6.  Wash thoroughly, paying special attention to the area where your surgery  will be performed.  7.  Thoroughly rinse your body with warm water from the neck down.  8.  DO NOT shower/wash with your normal soap after using and rinsing off  the CHG Soap.                9.  Pat yourself dry with a clean towel.            10.  Wear clean pajamas.            11.  Place clean sheets on your bed the night of your first shower and do not  sleep with pets. Day of Surgery : Do not apply any lotions/deodorants the morning of surgery.  Please wear clean clothes to the hospital/surgery center.  FAILURE TO FOLLOW THESE INSTRUCTIONS MAY RESULT IN THE CANCELLATION OF YOUR SURGERY PATIENT SIGNATURE_________________________________  NURSE SIGNATURE__________________________________  ________________________________________________________________________   Valerie [name]  An incentive spirometer is a tool that can help keep your lungs clear and active. This tool measures how well you are filling your lungs with each breath. Taking long deep breaths may help reverse or decrease the chance of developing breathing (pulmonary) problems (especially infection) following:  A long period of time when you are unable to move or be active. BEFORE THE PROCEDURE   If the spirometer includes an indicator to show your best effort, your nurse or respiratory  therapist will set it to a desired goal.  If possible, sit up straight or lean slightly forward. Try not to slouch.  Hold the incentive spirometer in an upright position. INSTRUCTIONS FOR USE  1. Sit on the edge of your bed if possible, or sit up as far as you can in bed or on a chair. 2. Hold the incentive spirometer in an upright position. 3. Breathe out normally. 4. Place the mouthpiece in your mouth and seal your lips tightly around it. 5. Breathe  in slowly and as deeply as possible, raising the piston or the ball toward the top of the column. 6. Hold your breath for 3-5 seconds or for as long as possible. Allow the piston or ball to fall to the bottom of the column. 7. Remove the mouthpiece from your mouth and breathe out normally. 8. Rest for a few seconds and repeat Steps 1 through 7 at least 10 times every 1-2 hours when you are awake. Take your time and take a few normal breaths between deep breaths. 9. The spirometer may include an indicator to show your best effort. Use the indicator as a goal to work toward during each repetition. 10. After each set of 10 deep breaths, practice coughing to be sure your lungs are clear. If you have an incision (the cut made at the time of surgery), support your incision when coughing by placing a pillow or rolled up towels firmly against it. Once you are able to get out of bed, walk around indoors and cough well. You may stop using the incentive spirometer when instructed by your caregiver.  RISKS AND COMPLICATIONS  Take your time so you do not get dizzy or light-headed.  If you are in pain, you may need to take or ask for pain medication before doing incentive spirometry. It is harder to take a deep breath if you are having pain. AFTER USE  Rest and breathe slowly and easily.  It can be helpful to keep track of a log of your progress. Your caregiver can provide you with a simple table to help with this. If you are using the spirometer at home,  follow these instructions: [place] IF:   You are having difficultly using the spirometer.  You have trouble using the spirometer as often as instructed.  Your pain medication is not giving enough relief while using the spirometer.  You develop fever of 100.5 F (38.1 C) or higher. SEEK IMMEDIATE MEDICAL CARE IF:   You cough up bloody sputum that had not been present before.  You develop fever of 102 F (38.9 C) or greater.  You develop worsening pain at or near the incision site. MAKE SURE YOU:   Understand these instructions.  Will watch your condition.  Will get help right away if you are not doing well or get worse. Document Released: 10/09/2006 Document Revised: 08/21/2011 Document Reviewed: 12/10/2006 [facility] Patient Information 2014 [place], [place].   ________________________________________________________________________

## 2018-04-08 ENCOUNTER — Encounter (HOSPITAL_COMMUNITY): Payer: Self-pay

## 2018-04-08 ENCOUNTER — Other Ambulatory Visit: Payer: Self-pay

## 2018-04-08 ENCOUNTER — Encounter (HOSPITAL_COMMUNITY)
Admission: RE | Admit: 2018-04-08 | Discharge: 2018-04-08 | Disposition: A | Discharge status: Home / Self Care | Payer: Medicare Other | Source: Ambulatory Visit | Attending: Orthopedic Surgery | Admitting: Orthopedic Surgery

## 2018-04-08 DIAGNOSIS — G4730 Sleep apnea, unspecified: Secondary | ICD-10-CM | POA: Diagnosis not present

## 2018-04-08 DIAGNOSIS — Z6841 Body Mass Index (BMI) 40.0 and over, adult: Secondary | ICD-10-CM | POA: Diagnosis not present

## 2018-04-08 DIAGNOSIS — Z01812 Encounter for preprocedural laboratory examination: Secondary | ICD-10-CM

## 2018-04-08 DIAGNOSIS — I10 Essential (primary) hypertension: Secondary | ICD-10-CM | POA: Diagnosis not present

## 2018-04-08 DIAGNOSIS — M25511 Pain in right shoulder: Secondary | ICD-10-CM | POA: Diagnosis not present

## 2018-04-08 DIAGNOSIS — F419 Anxiety disorder, unspecified: Secondary | ICD-10-CM | POA: Diagnosis not present

## 2018-04-08 DIAGNOSIS — M797 Fibromyalgia: Secondary | ICD-10-CM | POA: Diagnosis not present

## 2018-04-08 DIAGNOSIS — Z79899 Other long term (current) drug therapy: Secondary | ICD-10-CM | POA: Diagnosis not present

## 2018-04-08 DIAGNOSIS — M25811 Other specified joint disorders, right shoulder: Secondary | ICD-10-CM | POA: Diagnosis not present

## 2018-04-08 DIAGNOSIS — M353 Polymyalgia rheumatica: Secondary | ICD-10-CM | POA: Diagnosis not present

## 2018-04-08 DIAGNOSIS — K219 Gastro-esophageal reflux disease without esophagitis: Secondary | ICD-10-CM | POA: Diagnosis not present

## 2018-04-08 LAB — BASIC METABOLIC PANEL
Anion gap: 9 (ref 5–15)
BUN: 19 mg/dL (ref 6–20)
CHLORIDE: 105 mmol/L (ref 98–111)
CO2: 26 mmol/L (ref 22–32)
Calcium: 9.7 mg/dL (ref 8.9–10.3)
Creatinine, Ser: 1.18 mg/dL — ABNORMAL HIGH (ref 0.44–1.00)
GFR calc Af Amer: 59 mL/min — ABNORMAL LOW (ref >60)
GFR calc non Af Amer: 51 mL/min — ABNORMAL LOW (ref >60)
GLUCOSE: 166 mg/dL — AB (ref 70–99)
POTASSIUM: 4.6 mmol/L (ref 3.5–5.1)
Sodium: 140 mmol/L (ref 135–145)

## 2018-04-08 LAB — CBC
HEMATOCRIT: 40.6 % (ref 36.0–46.0)
Hemoglobin: 12.9 g/dL (ref 12.0–15.0)
MCH: 29.5 pg (ref 26.0–34.0)
MCHC: 31.8 g/dL (ref 30.0–36.0)
MCV: 92.7 fL (ref 80.0–100.0)
Platelets: 293 10*3/uL (ref 150–400)
RBC: 4.38 MIL/uL (ref 3.87–5.11)
RDW: 13.4 % (ref 11.5–15.5)
WBC: 7.2 10*3/uL (ref 4.0–10.5)
nRBC: 0 % (ref 0.0–0.2)

## 2018-04-08 MED ORDER — DEXTROSE 5 % IV SOLN
3.0000 g | INTRAVENOUS | Status: AC
  Administered 2018-04-09: 3 g via INTRAVENOUS
  Filled 2018-04-08: qty 3

## 2018-04-09 ENCOUNTER — Ambulatory Visit (HOSPITAL_COMMUNITY)
Admission: RE | Admit: 2018-04-09 | Discharge: 2018-04-09 | Disposition: A | Discharge status: Home / Self Care | Payer: Medicare Other | Source: Ambulatory Visit | Attending: Orthopedic Surgery | Admitting: Orthopedic Surgery

## 2018-04-09 ENCOUNTER — Ambulatory Visit (HOSPITAL_COMMUNITY): Payer: Medicare Other | Admitting: Anesthesiology

## 2018-04-09 ENCOUNTER — Encounter (HOSPITAL_COMMUNITY)
Admission: RE | Disposition: A | Discharge status: Home / Self Care | Payer: Self-pay | Source: Ambulatory Visit | Attending: Orthopedic Surgery

## 2018-04-09 ENCOUNTER — Encounter (HOSPITAL_COMMUNITY): Payer: Self-pay

## 2018-04-09 DIAGNOSIS — Z6841 Body Mass Index (BMI) 40.0 and over, adult: Secondary | ICD-10-CM | POA: Diagnosis not present

## 2018-04-09 DIAGNOSIS — M25811 Other specified joint disorders, right shoulder: Secondary | ICD-10-CM | POA: Insufficient documentation

## 2018-04-09 DIAGNOSIS — M353 Polymyalgia rheumatica: Secondary | ICD-10-CM | POA: Diagnosis not present

## 2018-04-09 DIAGNOSIS — M25511 Pain in right shoulder: Secondary | ICD-10-CM | POA: Diagnosis not present

## 2018-04-09 DIAGNOSIS — G8918 Other acute postprocedural pain: Secondary | ICD-10-CM | POA: Diagnosis not present

## 2018-04-09 DIAGNOSIS — I10 Essential (primary) hypertension: Secondary | ICD-10-CM | POA: Diagnosis not present

## 2018-04-09 DIAGNOSIS — M24111 Other articular cartilage disorders, right shoulder: Secondary | ICD-10-CM | POA: Diagnosis not present

## 2018-04-09 DIAGNOSIS — F419 Anxiety disorder, unspecified: Secondary | ICD-10-CM | POA: Insufficient documentation

## 2018-04-09 DIAGNOSIS — S46011A Strain of muscle(s) and tendon(s) of the rotator cuff of right shoulder, initial encounter: Secondary | ICD-10-CM | POA: Diagnosis not present

## 2018-04-09 DIAGNOSIS — Z79899 Other long term (current) drug therapy: Secondary | ICD-10-CM | POA: Diagnosis not present

## 2018-04-09 DIAGNOSIS — M797 Fibromyalgia: Secondary | ICD-10-CM | POA: Insufficient documentation

## 2018-04-09 DIAGNOSIS — M71311 Other bursal cyst, right shoulder: Secondary | ICD-10-CM | POA: Diagnosis present

## 2018-04-09 DIAGNOSIS — G4730 Sleep apnea, unspecified: Secondary | ICD-10-CM | POA: Insufficient documentation

## 2018-04-09 DIAGNOSIS — K219 Gastro-esophageal reflux disease without esophagitis: Secondary | ICD-10-CM | POA: Diagnosis not present

## 2018-04-09 DIAGNOSIS — M19011 Primary osteoarthritis, right shoulder: Secondary | ICD-10-CM | POA: Diagnosis not present

## 2018-04-09 DIAGNOSIS — M75121 Complete rotator cuff tear or rupture of right shoulder, not specified as traumatic: Secondary | ICD-10-CM

## 2018-04-09 HISTORY — DX: Complete rotator cuff tear or rupture of right shoulder, not specified as traumatic: M75.121

## 2018-04-09 HISTORY — DX: Other bursal cyst, right shoulder: M71.311

## 2018-04-09 HISTORY — PX: SHOULDER ARTHROSCOPY WITH ROTATOR CUFF REPAIR: SHX5685

## 2018-04-09 HISTORY — PX: EXCISION MASS UPPER EXTREMETIES: SHX6704

## 2018-04-09 SURGERY — ARTHROSCOPY, SHOULDER, WITH ROTATOR CUFF REPAIR
Anesthesia: General | Site: Shoulder | Laterality: Right

## 2018-04-09 MED ORDER — OXYCODONE HCL 5 MG PO TABS
ORAL_TABLET | ORAL | Status: AC
  Filled 2018-04-09: qty 1

## 2018-04-09 MED ORDER — MIDAZOLAM HCL 2 MG/2ML IJ SOLN
1.0000 mg | INTRAMUSCULAR | Status: DC | PRN
  Administered 2018-04-09: 2 mg via INTRAVENOUS
  Filled 2018-04-09: qty 2

## 2018-04-09 MED ORDER — MIDAZOLAM HCL 2 MG/2ML IJ SOLN
INTRAMUSCULAR | Status: AC
  Filled 2018-04-09: qty 2

## 2018-04-09 MED ORDER — PROPOFOL 10 MG/ML IV BOLUS
INTRAVENOUS | Status: DC | PRN
  Administered 2018-04-09: 200 mg via INTRAVENOUS

## 2018-04-09 MED ORDER — BUPIVACAINE LIPOSOME 1.3 % IJ SUSP
INTRAMUSCULAR | Status: DC | PRN
  Administered 2018-04-09: 10 mL via PERINEURAL

## 2018-04-09 MED ORDER — 0.9 % SODIUM CHLORIDE (POUR BTL) OPTIME
TOPICAL | Status: DC | PRN
  Administered 2018-04-09: 1000 mL

## 2018-04-09 MED ORDER — ONDANSETRON HCL 4 MG/2ML IJ SOLN
INTRAMUSCULAR | Status: DC | PRN
  Administered 2018-04-09: 4 mg via INTRAVENOUS

## 2018-04-09 MED ORDER — OXYCODONE HCL 5 MG/5ML PO SOLN
5.0000 mg | Freq: Once | ORAL | Status: AC | PRN
  Filled 2018-04-09: qty 5

## 2018-04-09 MED ORDER — MEPERIDINE HCL 50 MG/ML IJ SOLN: 6.2500 mg | INTRAMUSCULAR | Status: DC | PRN

## 2018-04-09 MED ORDER — FENTANYL CITRATE (PF) 100 MCG/2ML IJ SOLN
25.0000 ug | INTRAMUSCULAR | Status: DC | PRN
  Administered 2018-04-09 (×2): 50 ug via INTRAVENOUS

## 2018-04-09 MED ORDER — CHLORHEXIDINE GLUCONATE 4 % EX LIQD: 60.0000 mL | Freq: Once | CUTANEOUS | Status: DC

## 2018-04-09 MED ORDER — SUCCINYLCHOLINE CHLORIDE 20 MG/ML IJ SOLN
INTRAMUSCULAR | Status: DC | PRN
  Administered 2018-04-09: 120 mg via INTRAVENOUS

## 2018-04-09 MED ORDER — PROMETHAZINE HCL 25 MG/ML IJ SOLN: 6.2500 mg | INTRAMUSCULAR | Status: DC | PRN

## 2018-04-09 MED ORDER — FENTANYL CITRATE (PF) 100 MCG/2ML IJ SOLN
INTRAMUSCULAR | Status: DC | PRN
  Administered 2018-04-09 (×2): 50 ug via INTRAVENOUS

## 2018-04-09 MED ORDER — PROPOFOL 10 MG/ML IV BOLUS
INTRAVENOUS | Status: AC
  Filled 2018-04-09: qty 20

## 2018-04-09 MED ORDER — SODIUM CHLORIDE 0.9 % IV SOLN
INTRAVENOUS | Status: DC | PRN
  Administered 2018-04-09: 40 ug/min via INTRAVENOUS

## 2018-04-09 MED ORDER — SUGAMMADEX SODIUM 500 MG/5ML IV SOLN
INTRAVENOUS | Status: DC | PRN
  Administered 2018-04-09: 200 mg via INTRAVENOUS

## 2018-04-09 MED ORDER — LACTATED RINGERS IV SOLN
INTRAVENOUS | Status: DC
  Administered 2018-04-09: 12:00:00 via INTRAVENOUS

## 2018-04-09 MED ORDER — OXYCODONE HCL 5 MG PO TABS
5.0000 mg | ORAL_TABLET | Freq: Once | ORAL | Status: AC | PRN
  Administered 2018-04-09: 5 mg via ORAL

## 2018-04-09 MED ORDER — LIDOCAINE HCL (CARDIAC) PF 100 MG/5ML IV SOSY
PREFILLED_SYRINGE | INTRAVENOUS | Status: DC | PRN
  Administered 2018-04-09: 50 mg via INTRAVENOUS

## 2018-04-09 MED ORDER — LACTATED RINGERS IR SOLN
Status: DC | PRN
  Administered 2018-04-09: 6000 mL

## 2018-04-09 MED ORDER — FENTANYL CITRATE (PF) 100 MCG/2ML IJ SOLN
50.0000 ug | INTRAMUSCULAR | Status: DC | PRN
  Administered 2018-04-09: 100 ug via INTRAVENOUS
  Filled 2018-04-09: qty 2

## 2018-04-09 MED ORDER — MIDAZOLAM HCL 5 MG/5ML IJ SOLN
INTRAMUSCULAR | Status: DC | PRN
  Administered 2018-04-09 (×2): 1 mg via INTRAVENOUS

## 2018-04-09 MED ORDER — STERILE WATER FOR IRRIGATION IR SOLN
Status: DC | PRN
  Administered 2018-04-09: 1000 mL

## 2018-04-09 MED ORDER — BUPIVACAINE HCL (PF) 0.5 % IJ SOLN
INTRAMUSCULAR | Status: DC | PRN
  Administered 2018-04-09: 20 mL via PERINEURAL

## 2018-04-09 MED ORDER — DEXAMETHASONE SODIUM PHOSPHATE 10 MG/ML IJ SOLN
INTRAMUSCULAR | Status: DC | PRN
  Administered 2018-04-09: 10 mg via INTRAVENOUS

## 2018-04-09 MED ORDER — ROCURONIUM BROMIDE 100 MG/10ML IV SOLN
INTRAVENOUS | Status: DC | PRN
  Administered 2018-04-09: 10 mg via INTRAVENOUS
  Administered 2018-04-09: 50 mg via INTRAVENOUS

## 2018-04-09 MED ORDER — FENTANYL CITRATE (PF) 100 MCG/2ML IJ SOLN
INTRAMUSCULAR | Status: AC
  Filled 2018-04-09: qty 2

## 2018-04-09 MED ORDER — LABETALOL HCL 5 MG/ML IV SOLN
INTRAVENOUS | Status: DC | PRN
  Administered 2018-04-09 (×4): 5 mg via INTRAVENOUS

## 2018-04-09 SURGICAL SUPPLY — 29 items
BLADE CUTTER GATOR 3.5 (BLADE) ×2 IMPLANT
BLADE SURG SZ11 CARB STEEL (BLADE) ×2 IMPLANT
COVER SURGICAL LIGHT HANDLE (MISCELLANEOUS) ×2 IMPLANT
COVER WAND RF STERILE (DRAPES) ×2 IMPLANT
DRSG PAD ABDOMINAL 8X10 ST (GAUZE/BANDAGES/DRESSINGS) IMPLANT
GAUZE SPONGE 4X4 12PLY STRL (GAUZE/BANDAGES/DRESSINGS) ×2 IMPLANT
GLOVE BIOGEL PI IND STRL 7.0 (GLOVE) ×2 IMPLANT
GLOVE BIOGEL PI IND STRL 7.5 (GLOVE) ×1 IMPLANT
GLOVE BIOGEL PI IND STRL 8 (GLOVE) ×2 IMPLANT
GLOVE BIOGEL PI INDICATOR 7.0 (GLOVE) ×2
GLOVE BIOGEL PI INDICATOR 7.5 (GLOVE) ×1
GLOVE BIOGEL PI INDICATOR 8 (GLOVE) ×2
GLOVE ECLIPSE 7.5 STRL STRAW (GLOVE) ×2 IMPLANT
GLOVE ECLIPSE 8.0 STRL XLNG CF (GLOVE) ×2 IMPLANT
GOWN STRL REUS W/TWL LRG LVL3 (GOWN DISPOSABLE) ×2 IMPLANT
GOWN STRL REUS W/TWL XL LVL3 (GOWN DISPOSABLE) ×4 IMPLANT
KIT BASIN OR (CUSTOM PROCEDURE TRAY) ×2 IMPLANT
NEEDLE SPNL 18GX3.5 QUINCKE PK (NEEDLE) ×2 IMPLANT
PAD ABD 8X10 STRL (GAUZE/BANDAGES/DRESSINGS) ×4 IMPLANT
POSITIONER SURGICAL ARM (MISCELLANEOUS) ×2 IMPLANT
PROBE BIPOLAR ATHRO 135MM 90D (MISCELLANEOUS) ×4 IMPLANT
SLING ARM IMMOBILIZER XL (CAST SUPPLIES) ×2 IMPLANT
STRIP CLOSURE SKIN 1/2X4 (GAUZE/BANDAGES/DRESSINGS) ×2 IMPLANT
SUPPORT WRAP ARM LG (MISCELLANEOUS) ×2 IMPLANT
SUT MNCRL AB 4-0 PS2 18 (SUTURE) ×2 IMPLANT
SUT VIC AB 3-0 SH 8-18 (SUTURE) ×2 IMPLANT
SUT VICRYL 0 UR6 27IN ABS (SUTURE) ×2 IMPLANT
TAPE CLOTH SURG 6X10 WHT LF (GAUZE/BANDAGES/DRESSINGS) ×2 IMPLANT
TOWEL OR 17X26 10 PK STRL BLUE (TOWEL DISPOSABLE) ×4 IMPLANT

## 2018-04-09 NOTE — Op Note (Signed)
04/09/2018  3:45 PM  PATIENT:  Valerie [name]    PRE-OPERATIVE DIAGNOSIS: Right shoulder pain with acromioclavicular joint cyst, history of rotator cuff tear, possible recurrent tear  POST-OPERATIVE DIAGNOSIS:   1.  Right shoulder acromioclavicular joint cyst, 3 x 5 cm 2.  Right shoulder glenohumeral articular fraying, grade 2 and some areas of grade 3 on the humeral head with some grade 2 chondral changes on the glenoid 3.  Rotator cuff fraying, but no evidence for recurrent full-thickness tear  PROCEDURE:    1.  Right shoulder arthroscopy with extensive debridement of chondral surface of the humeral head, glenoid face, and a small area of the supraspinatus from the undersurface. 2.  Open right acromioclavicular joint cyst excision, subfascial below the trapezius  SURGEON:  Johnny Bridge, MD  PHYSICIAN ASSISTANT: Joya Gaskins, OPA-C, present and scrubbed throughout the case, critical for completion in a timely fashion, and for retraction, instrumentation, and closure.  ANESTHESIA:   General with Exparel interscalene block  PREOPERATIVE INDICATIONS:  Valerie [name] is a  57 y.o. female who had a right shoulder rotator cuff repair performed 8 years ago and presented with increased shoulder pain as well as a mass with a cyst seen on MRI and a possible recurrent tear.  She elected for surgical management.  The risks benefits and alternatives were discussed with the patient preoperatively including but not limited to the risks of infection, bleeding, nerve injury, cardiopulmonary complications, the need for revision surgery, incomplete relief of pain, recurrent joint cyst, progression of rotator cuff arthropathy, among others, and the patient was willing to proceed.  ESTIMATED BLOOD LOSS: 50 mL  OPERATIVE IMPLANTS: None.  I used a Vicryl suture, 0, for the closure of the stalk of the acromial clavicular joint cyst.  OPERATIVE FINDINGS: The shoulder had full motion during  examination under anesthesia.  The glenohumeral articular cartilage had some fraying on the humeral head, as well as to a lesser degree on the glenoid face.  The supraspinatus had some fenestration but no significant recurrent tear as seen either from below or above.  There was no significant subacromial spurring, and I did not expose the acromioclavicular joint from the arthroscopic side because the capsular scar was pristine.  OPERATIVE PROCEDURE: The patient is brought to the operating room and placed in the supine position.  General anesthesia was administered.  IV antibiotics were given.  The right upper extremity was examined and had full motion.  The upper extremity was prepped and draped in usual sterile fashion, she was in a beachchair position.  Diagnostic arthroscopy carried out with the above-named findings noted.  Used the arthroscopic shaver to debride any loose bodies or chondral debris or chondral fragmentation from within the joint.  I also debrided the undersurface of the supraspinatus slightly.  The biceps tendon had already been released.  I went to the subacromial space, and it was found to have intact rotator cuff tendon from above.  The tendon itself was very thin, and fairly dysfunctional, but there was nothing really to sew back.  She had good preoperative motion up to 175 degrees, and I suspect the majority of her symptoms were coming from her cyst.  I then made an incision over the location of the [facility] joint cyst, dissected down through the trapezius musculature, and below this exposed a very clean appearing acromioclavicular joint cyst.  There was no concerning features about this.  I mobilized the cysts both medially and laterally and posteriorly, and truncated  the cyst, and then excised it completely, and then closed the stalk of the cyst with 0 Vicryl and a total of 2 figure-of-eight sutures.  The wounds were irrigated, the fascia of the trapezius closed, subcutaneous tissue  closed with Vicryl and Monocryl for the skin and the portals.  Sterile gauze was applied and she was awakened and returned to the PACU in stable and satisfactory condition.  There were no complications and she tolerated the procedure well.

## 2018-04-09 NOTE — Anesthesia Procedure Notes (Signed)
Procedure Name: Intubation Date/Time: 04/09/2018 2:14 PM Performed by: Lynda Rainwater, MD Pre-anesthesia Checklist: Patient identified, Suction available, Emergency Drugs available, Patient being monitored and Timeout performed Patient Re-evaluated:Patient Re-evaluated prior to induction Oxygen Delivery Method: Circle system utilized Preoxygenation: Pre-oxygenation with 100% oxygen Induction Type: IV induction Ventilation: Two handed mask ventilation required Laryngoscope Size: Mac and 4 Grade View: Grade III Tube type: Oral Tube size: 7.0 mm Number of attempts: 1 Airway Equipment and Method: Stylet Placement Confirmation: ETT inserted through vocal cords under direct vision,  positive ETCO2 and breath sounds checked- equal and bilateral Secured at: 21 cm Tube secured with: Tape Dental Injury: Teeth and Oropharynx as per pre-operative assessment  Difficulty Due To: Difficulty was anticipated, Difficult Airway- due to large tongue, Difficult Airway- due to reduced neck mobility, Difficult Airway- due to dentition, Difficult Airway- due to limited oral opening and Difficult Airway- due to anterior larynx Future Recommendations: Recommend- induction with short-acting agent, and alternative techniques readily available

## 2018-04-09 NOTE — Anesthesia Preprocedure Evaluation (Signed)
Anesthesia Evaluation  Patient identified by MRN, date of birth, ID band Patient awake    Reviewed: Allergy & Precautions, NPO status , Patient's Chart, lab work & pertinent test results, reviewed documented beta blocker date and time   Airway Mallampati: II  TM Distance: >3 FB Neck ROM: Full    Dental no notable dental hx. (+) Teeth Intact, Dental Advisory Given   Pulmonary asthma , sleep apnea and Continuous Positive Airway Pressure Ventilation ,    breath sounds clear to auscultation       Cardiovascular hypertension, Pt. on medications and Pt. on home beta blockers + dysrhythmias  Rhythm:Regular Rate:Normal  ECG: SR, rate 69   Neuro/Psych Anxiety Depression PTSD Neuromuscular disease    GI/Hepatic GERD  Medicated and Controlled,Hx of colitis gastroparesis   Endo/Other  Morbid obesity  Renal/GU Renal InsufficiencyRenal disease     Musculoskeletal  (+) Arthritis , Fibromyalgia -Chronic pain   Abdominal (+) + obese,   Peds  Hematology   Anesthesia Other Findings   Reproductive/Obstetrics                             Anesthesia Physical  Anesthesia Plan  ASA: III  Anesthesia Plan: General   Post-op Pain Management:  Regional for Post-op pain   Induction: Intravenous  PONV Risk Score and Plan: 3 and Ondansetron, Dexamethasone and Midazolam  Airway Management Planned: Oral ETT  Additional Equipment:   Intra-op Plan:   Post-operative Plan: Extubation in OR  Informed Consent: I have reviewed the patients History and Physical, chart, labs and discussed the procedure including the risks, benefits and alternatives for the proposed anesthesia with the patient or authorized representative who has indicated his/her understanding and acceptance.   Dental advisory given  Plan Discussed with: CRNA  Anesthesia Plan Comments:         Anesthesia Quick Evaluation

## 2018-04-09 NOTE — H&P (Signed)
PREOPERATIVE H&P  Chief Complaint: right shoulder pain  HPI: Valerie [name] is a 57 y.o. female who presents for preoperative history and physical with a diagnosis of right shoulder rotator cuff tear with AC joint cyst. Symptoms are rated as moderate to severe, and have been worsening.  This is significantly impairing activities of daily living.  She has elected for surgical management.   She has had previous RC repair, and presented with increasing pain over the trapezius.  She was pushing some books and felt pain laterally.    Past Medical History:  Diagnosis Date  . Acute ischemic colitis ([place])   . Anemia 05/01/2012  . Anxiety   . ARF (acute renal failure) ([place]) 12/28/2015  . Arthritis    "basically ~ q joint" (12/28/2015)  . Asthma   . Basal cell carcinoma of upper back ~ 2011  . Chronic right-sided headaches    and neck pain, relieved wiht PT and chronic practic manipulation  . Complete tear of right rotator cuff, recurrent 04/09/2018  . DDD (degenerative disc disease), lumbar   . Diarrhea    EPISODIC  . Fatigue    h/o excessive sedation, etiology unknown  . Fibromyalgia   . First degree heart block   . GERD (gastroesophageal reflux disease) 04/06/2017  . Headache(784.0)    "muscular w/migraine intensity; ~ 5-6 months" (12/28/2015)  . History of ischemic colitis 09/24/2016  . Hypertension   . Mental disorder    reports PTSD  . Microscopic polyangiitis ([place])   . Nerve pain 2011   nerve damage to right arm; normal EMG/NCV to RUExt after shoulder surgery 12/2009  . Other bursal cyst, right shoulder 04/09/2018  . Pancreatitis    due to Effexor  . Pneumonia ~ 2014; 08/2015   Aspiration  . Polymyalgia rheumatica ([place]) onset 08/2015   changed diagnosis to MPA  . Precancerous melanosis ([place])    "2nd toe left foot; 5th toe of right foot; right temple; had them all cut off"  . Sleep apnea    CPAP-9   Past Surgical History:  Procedure Laterality Date  . BASAL CELL CARCINOMA  EXCISION  ~ 2011   back  . BOTOX INJECTION N/A 11/06/2012   Procedure: BOTOX INJECTION;  Surgeon: Missy Sabins, MD;  Location: [facility];  Service: Endoscopy;  Laterality: N/A;  . BOTOX INJECTION N/A 12/04/2013   Procedure: BOTOX INJECTION;  Surgeon: Missy Sabins, MD;  Location: [place];  Service: Endoscopy;  Laterality: N/A;  . BOTOX INJECTION N/A 02/26/2014   Procedure: BOTOX INJECTION;  Surgeon: Missy Sabins, MD;  Location: [place];  Service: Endoscopy;  Laterality: N/A;  . BOTOX INJECTION N/A 07/08/2015   Procedure: BOTOX INJECTION;  Surgeon: Teena Irani, MD;  Location: [facility];  Service: Endoscopy;  Laterality: N/A;  . BOTOX INJECTION N/A 04/27/2016   Procedure: BOTOX INJECTION;  Surgeon: Teena Irani, MD;  Location: [facility];  Service: Endoscopy;  Laterality: N/A;  . BOTOX INJECTION N/A 04/09/2017   Procedure: BOTOX INJECTION;  Surgeon: Clarene Essex, MD;  Location: [place];  Service: Endoscopy;  Laterality: N/A;  . ESOPHAGOGASTRODUODENOSCOPY  12/21/2011   Procedure: ESOPHAGOGASTRODUODENOSCOPY (EGD);  Surgeon: Missy Sabins, MD;  Location[facility];  Service: Endoscopy;  Laterality: N/A;  . ESOPHAGOGASTRODUODENOSCOPY N/A 11/06/2012   Procedure: ESOPHAGOGASTRODUODENOSCOPY (EGD);  Surgeon: Missy Sabins, MD;  Location: [facility];  Service: Endoscopy;  Laterality: N/A;  . ESOPHAGOGASTRODUODENOSCOPY N/A 12/04/2013   Procedure: ESOPHAGOGASTRODUODENOSCOPY (EGD);  Surgeon: Missy Sabins, MD;  Location: [facility];  Service: Endoscopy;  Laterality: N/A;  . ESOPHAGOGASTRODUODENOSCOPY N/A 02/26/2014   Procedure: ESOPHAGOGASTRODUODENOSCOPY (EGD);  Surgeon: Missy Sabins, MD;  Location: [facility] ENDOSCOPY;  Service: Endoscopy;  Laterality: N/A;  . ESOPHAGOGASTRODUODENOSCOPY N/A 04/09/2017   Procedure: ESOPHAGOGASTRODUODENOSCOPY (EGD);  Surgeon: Clarene Essex, MD;  Location: [place];  Service: Endoscopy;  Laterality: N/A;  . ESOPHAGOGASTRODUODENOSCOPY (EGD) WITH PROPOFOL N/A  07/08/2015   Procedure: ESOPHAGOGASTRODUODENOSCOPY (EGD) WITH PROPOFOL;  Surgeon: Teena Irani, MD;  [facility];  Service: Endoscopy;  Laterality: N/A;  . ESOPHAGOGASTRODUODENOSCOPY (EGD) WITH PROPOFOL N/A 04/27/2016   Procedure: ESOPHAGOGASTRODUODENOSCOPY (EGD) WITH PROPOFOL;  Surgeon: Teena Irani, MD;  [facility];  Service: Endoscopy;  Laterality: N/A;  . FLEXIBLE SIGMOIDOSCOPY  05/01/2012   Procedure: FLEXIBLE SIGMOIDOSCOPY;  Surgeon: Jeryl Columbia, MD;  Location: [facility];  Service: Endoscopy;  Laterality: N/A;  unprepped  . FLEXIBLE SIGMOIDOSCOPY Left 09/28/2016   Procedure: FLEXIBLE SIGMOIDOSCOPY;  Surgeon: Wilford Corner, MD;  Location: [facility];  Service: Endoscopy;  Laterality: Left;  . LAPAROSCOPIC CHOLECYSTECTOMY  01/2002  . NASAL SEPTOPLASTY W/ TURBINOPLASTY  1998  . SHOULDER ARTHROSCOPY WITH ROTATOR CUFF REPAIR AND OPEN BICEPS TENODESIS Bilateral 2010-2013   left-right   Social History   Socioeconomic History  . Marital status: Single    Spouse name: Not on file  . Number of children: 0  . Years of education: College  . Highest education level: Not on file  Occupational History  . Occupation: disabled  Social Needs  . Financial resource strain: Not on file  . Food insecurity:    Worry: Not on file    Inability: Not on file  . Transportation needs:    Medical: Not on file    Non-medical: Not on file  Tobacco Use  . Smoking status: Never Smoker  . Smokeless tobacco: Never Used  Substance and Sexual Activity  . Alcohol use: No  . Drug use: No  . Sexual activity: Not Currently  Lifestyle  . Physical activity:    Days per week: Not on file    Minutes per session: Not on file  . Stress: Not on file  Relationships  . Social connections:    Talks on phone: Not on file    Gets together: Not on file    Attends religious service: Not on file    Active member of club or organization: Not on file    Attends meetings of clubs or  organizations: Not on file    Relationship status: Not on file  Other Topics Concern  . Not on file  Social History Narrative   Caffeine 2 cups iced tea daily avg.   Family History  Problem Relation Age of Onset  . Heart disease Mother   . COPD Mother   . Arthritis Mother   . Cancer Mother   . Hypertension Mother   . Arthritis Paternal Grandmother   . Heart disease Paternal Grandmother   . Heart disease Paternal Grandfather    Allergies  Allergen Reactions  . Dilaudid [Hydromorphone Hcl] Other (See Comments)    Headache   . Effexor [Venlafaxine]     pancreatitis  . Pregabalin Other (See Comments)    Drug induced stupor  . Adhesive [Tape]     Paper tape causes blood red marks  . Metoclopramide Hcl Other (See Comments)    "crawling inside"  . Neurontin [Gabapentin] Other (See Comments)    Tremors   . Other Other (See Comments)    Avoid all  SSRI's and SNRI's- damaged pancreas  . Serotonin Reuptake Inhibitors (Ssris) Other (See Comments)    Damage to pancreas and bowel/urinary incontinence    Prior to Admission medications   Medication Sig Start Date End Date Taking? Authorizing Provider  albuterol (PROAIR HFA) 108 (90 Base) MCG/ACT inhaler Inhale 2 puffs every 4 (four) hours as needed into the lungs for wheezing or shortness of breath. 04/23/17  Yes Shawnee Knapp, MD  allopurinol (ZYLOPRIM) 300 MG tablet TAKE 1/2 TABLET BY MOUTH ONCE A DAY Patient taking differently: Take 150-300 mg by mouth daily.  12/31/17  Yes Shawnee Knapp, MD  ALPRAZolam Duanne Moron) 1 MG tablet TAKE ONE TABLET BY MOUTH THREE TIMES A DAY AS NEEDED FOR ANXIETY 02/22/18  Yes Shawnee Knapp, MD  baclofen (LIORESAL) 10 MG tablet Take 10 mg by mouth 3 (three) times daily as needed for muscle spasms.    Yes [provider]  benazepril (LOTENSIN) 20 MG tablet Take 1 tablet (20 mg total) daily by mouth. 04/23/17  Yes Shawnee Knapp, MD  Calcium Carb-Cholecalciferol (CALCIUM 600 + D PO) Take 0.5 tablets by mouth daily.    Yes [provider]  cetirizine (ZYRTEC) 10 MG tablet Take 10 mg by mouth daily.    Yes [provider]  Cholecalciferol (VITAMIN D3) 5000 units CAPS Take 5,000 Units by mouth daily.    Yes [provider]  dexlansoprazole (DEXILANT) 60 MG capsule Take 60 mg by mouth 2 (two) times daily.   Yes [provider]  fexofenadine (ALLEGRA) 180 MG tablet Take 180 mg by mouth daily. Switches between Allegra and Zyrtec as needed for allergies   Yes [provider]  fluticasone (FLONASE) 50 MCG/ACT nasal spray PLACE 2 SPRAYS INTO BOTH NOSTRILS DAILY Patient taking differently: Place 2 sprays into both nostrils at bedtime.  11/28/17  Yes Shawnee Knapp, MD  HYDROcodone-acetaminophen [facility]) 10-325 MG per tablet Take 1-2 tablets by mouth every 6 (six) hours as needed for moderate pain (pain.).    Yes [provider]  hydrOXYzine (ATARAX/VISTARIL) 10 MG tablet Take 1-3 tablets (10-30 mg total) by mouth every 6 (six) hours as needed for anxiety. Patient taking differently: Take 10-30 mg by mouth 3 (three) times daily as needed for anxiety.  02/22/18  Yes Shawnee Knapp, MD  Melatonin 5 MG TABS Take 5-10 mg by mouth at bedtime as needed (sleep).   Yes [provider]  metoprolol succinate (TOPROL-XL) 100 MG 24 hr tablet Take 1 tablet (100 mg total) by mouth daily. Take with or immediately following a meal. 07/09/17  Yes Shawnee Knapp, MD  modafinil (PROVIGIL) 200 MG tablet Take 0.5 tablets (100 mg total) by mouth 2 (two) times daily. Patient taking differently: Take 100 mg by mouth See admin instructions. Take 100 mg every morning may take a second 100 mg dose as needed for narcolepsy 10/11/17  Yes Dohmeier, Asencion Partridge, MD  montelukast (SINGULAIR) 10 MG tablet TAKE ONE TABLET BY MOUTH EVERY NIGHT AT BEDTIME 08/15/17  Yes Shawnee Knapp, MD  morphine (MS CONTIN) 30 MG 12 hr tablet Take 30 mg by mouth every 8 (eight) hours. Not from [place] 09/23/12  Yes [provider]   Multiple Vitamins-Minerals (MULTIVITAMIN PO) Take 1 tablet by mouth daily.   Yes [provider]  Olopatadine HCl (PAZEO) 0.7 % SOLN Apply 1 drop to eye daily. Patient taking differently: Apply 1 drop to eye daily as needed (allergies).  07/09/17  Yes Shawnee Knapp,  MD  ondansetron (ZOFRAN) 4 MG tablet Take 1 tablet (4 mg total) by mouth every 8 (eight) hours as needed for nausea or vomiting. 11/18/16  Yes Tereasa Coop, PA-C  promethazine (PHENERGAN) 25 MG tablet Take 1 tablet (25 mg total) by mouth every 6 (six) hours as needed for nausea or vomiting. 11/18/16  Yes Tereasa Coop, PA-C  pseudoephedrine (SUDAFED) 120 MG 12 hr tablet Take 120 mg by mouth daily as needed for congestion.   Yes [provider]  spironolactone (ALDACTONE) 100 MG tablet Take 1 tablet (100 mg total) by mouth daily. 12/29/17  Yes Shawnee Knapp, MD  sucralfate (CARAFATE) 1 GM/10ML suspension Take 10 mLs (1 g total) by mouth 4 (four) times daily as needed (stomach pain). Patient taking differently: Take 1 g by mouth 2 (two) times daily as needed (stomach pain).  09/20/17  Yes Shawnee Knapp, MD  torsemide (DEMADEX) 10 MG tablet Take 1 tablet (10 mg total) by mouth daily. Patient taking differently: Take 10 mg by mouth daily as needed (edema).  02/22/18  Yes Shawnee Knapp, MD  ibuprofen (ADVIL,MOTRIN) 600 MG tablet Take 600 mg by mouth daily as needed (migraines).    [provider]  ipratropium (ATROVENT) 0.03 % nasal spray Place 2 sprays 4 (four) times daily into the nose. Patient taking differently: Place 2 sprays into the nose as needed for rhinitis.  04/23/17   Shawnee Knapp, MD  Pancrelipase, Lip-Prot-Amyl, (CREON) 24000 UNITS CPEP Take 72,000 Units by mouth 3 (three) times daily as needed (for pancreatitis).     [provider]     Positive ROS: All other systems have been reviewed and were otherwise negative with the exception of those mentioned in the HPI and as above.  Physical  Exam: General: Alert, no acute distress Cardiovascular: No pedal edema Respiratory: No cyanosis, no use of accessory musculature GI: No organomegaly, abdomen is soft and non-tender Skin: No lesions in the area of chief complaint Neurologic: Sensation intact distally Psychiatric: Patient is competent for consent with normal mood and affect Lymphatic: No axillary or cervical lymphadenopathy  MUSCULOSKELETAL: AROM 0-175, well healed previous surgical wounds, positive cyst with pain to palpation.    Assessment: Right rotator cuff tear with cyst formation, recurrent   Plan: Plan for Procedure(s): RIGHT SHOULDER ARTHROSCOPY WITH ROTATOR CUFF REPAIR, EXTENSIVE DEBRIDEMENT, DISTAL CLAVICULECTOMY EXCISION SOFT TISSUE TUMOR RIGHT SHOULDER AREA SUBCUTANEOUS  The risks benefits and alternatives were discussed with the patient including but not limited to the risks of nonoperative treatment, versus surgical intervention including infection, bleeding, nerve injury,  Recurrent tear, recurrent cyst formation, blood clots, cardiopulmonary complications, morbidity, mortality, among others, and they were willing to proceed.      Johnny Bridge, MD Cell [phone]   04/09/2018 1:07 PM

## 2018-04-09 NOTE — Anesthesia Procedure Notes (Signed)
Anesthesia Regional Block: Interscalene brachial plexus block   Pre-Anesthetic Checklist: ,, timeout performed, Correct Patient, Correct Site, Correct Laterality, Correct Procedure, Correct Position, site marked, Risks and benefits discussed,  Surgical consent,  Pre-op evaluation,  At surgeon's request and post-op pain management  Laterality: Right  Prep: chloraprep       Needles:  Injection technique: Single-shot  Needle Type: Stimiplex     Needle Length: 9cm  Needle Gauge: 21     Additional Needles:   Procedures:,,,, ultrasound used (permanent image in chart),,,,  Narrative:  Start time: 04/09/2018 12:43 PM End time: 04/09/2018 12:48 PM Injection made incrementally with aspirations every 5 mL.  Performed by: Personally  Anesthesiologist: Lynda Rainwater, MD

## 2018-04-09 NOTE — Discharge Instructions (Signed)
Diet: As you were doing prior to hospitalization   Shower:  May shower but keep the wounds dry, use an occlusive plastic wrap, NO SOAKING IN TUB.  If the bandage gets wet, change with a clean dry gauze.  If you have a splint on, leave the splint in place and keep the splint dry with a plastic bag.  Dressing:  You may change your dressing 3-5 days after surgery, unless you have a splint.  If you have a splint, then just leave the splint in place and we will change your bandages during your first follow-up appointment.    If you had hand or foot surgery, we will plan to remove your stitches in about 2 weeks in the office.  For all other surgeries, there are sticky tapes (steri-strips) on your wounds and all the stitches are absorbable.  Leave the steri-strips in place when changing your dressings, they will peel off with time, usually 2-3 weeks.  Activity:  Increase activity slowly as tolerated, but follow the weight bearing instructions below.  The rules on driving is that you can not be taking narcotics while you drive, and you must feel in control of the vehicle.    Weight Bearing:   Okay to use your right upper extremity as soon as you are comfortable, but do not get the wounds wet..    To prevent constipation: you may use a stool softener such as -  Colace (over the counter) 100 mg by mouth twice a day  Drink plenty of fluids (prune juice may be helpful) and high fiber foods Miralax (over the counter) for constipation as needed.    Itching:  If you experience itching with your medications, try taking only a single pain pill, or even half a pain pill at a time.  You may take up to 10 pain pills per day, and you can also use benadryl over the counter for itching or also to help with sleep.   Precautions:  If you experience chest pain or shortness of breath - call 911 immediately for transfer to the hospital emergency department!!  If you develop a fever greater that 101 F, purulent drainage  from wound, increased redness or drainage from wound, or calf pain -- Call the office at [phone]                                                Follow- Up Appointment:  Please call for an appointment to be seen in 2 weeks [place] - [phone]

## 2018-04-09 NOTE — Progress Notes (Signed)
Assisted Dr. Miller with right, ultrasound guided, interscalene  block. Side rails up, monitors on throughout procedure. See vital signs in flow sheet. Tolerated Procedure well. 

## 2018-04-09 NOTE — Transfer of Care (Signed)
Immediate Anesthesia Transfer of Care Note  Patient: Valerie [name]  Procedure(s) Performed: RIGHT SHOULDER ARTHROSCOPY WITH EXTENSIVE DEBRIDEMENT (Right Shoulder) EXCISION RIGHT SHOULDER CYST (Right Shoulder)  Patient Location: PACU  Anesthesia Type:General  Level of Consciousness: awake, drowsy, patient cooperative, lethargic and responds to stimulation  Airway & Oxygen Therapy: Patient Spontanous Breathing and Patient connected to face mask oxygen  Post-op Assessment: Report given to RN, Post -op Vital signs reviewed and stable and Patient moving all extremities  Post vital signs: Reviewed and stable  Last Vitals:  Vitals Value Taken Time  BP 146/96 04/09/2018  4:09 PM  Temp    Pulse 92 04/09/2018  4:12 PM  Resp 18 04/09/2018  4:12 PM  SpO2 98 % 04/09/2018  4:12 PM  Vitals shown include unvalidated device data.  Last Pain:  Vitals:   04/09/18 1154  TempSrc:   PainSc: 5       Patients Stated Pain Goal: 5 (16/07/37 1062)  Complications: No apparent anesthesia complications

## 2018-04-10 ENCOUNTER — Encounter (HOSPITAL_COMMUNITY): Payer: Self-pay | Admitting: Orthopedic Surgery

## 2018-04-10 NOTE — Anesthesia Postprocedure Evaluation (Signed)
Anesthesia Post Note  Patient: Valerie [name]  Procedure(s) Performed: RIGHT SHOULDER ARTHROSCOPY WITH EXTENSIVE DEBRIDEMENT (Right Shoulder) EXCISION RIGHT SHOULDER CYST (Right Shoulder)     Patient location during evaluation: PACU Anesthesia Type: General Level of consciousness: awake and alert Pain management: pain level controlled Vital Signs Assessment: post-procedure vital signs reviewed and stable Respiratory status: spontaneous breathing, nonlabored ventilation and respiratory function stable Cardiovascular status: blood pressure returned to baseline and stable Postop Assessment: no apparent nausea or vomiting Anesthetic complications: no    Last Vitals:  Vitals:   04/09/18 1700 04/09/18 1745  BP: (!) 152/96 (!) 154/99  Pulse: 89 84  Resp: 14 16  Temp: 36.9 C 36.7 C  SpO2: 94% 97%    Last Pain:  Vitals:   04/09/18 1745  TempSrc: Oral  PainSc: 3    Pain Goal: Patients Stated Pain Goal: 5 (04/09/18 1154)               Lynda Rainwater

## 2018-04-11 ENCOUNTER — Other Ambulatory Visit: Payer: Self-pay

## 2018-04-11 ENCOUNTER — Ambulatory Visit: Payer: Self-pay | Admitting: *Deleted

## 2018-04-11 ENCOUNTER — Observation Stay (HOSPITAL_COMMUNITY)
Admission: EM | Admit: 2018-04-11 | Discharge: 2018-04-13 | Disposition: A | Discharge status: Home / Self Care | Payer: Medicare Other | Attending: Internal Medicine | Admitting: Internal Medicine

## 2018-04-11 ENCOUNTER — Emergency Department (HOSPITAL_COMMUNITY): Payer: Medicare Other

## 2018-04-11 ENCOUNTER — Telehealth: Payer: Self-pay | Admitting: Family Medicine

## 2018-04-11 ENCOUNTER — Encounter (HOSPITAL_COMMUNITY): Payer: Self-pay | Admitting: Emergency Medicine

## 2018-04-11 DIAGNOSIS — Z9989 Dependence on other enabling machines and devices: Secondary | ICD-10-CM

## 2018-04-11 DIAGNOSIS — M5136 Other intervertebral disc degeneration, lumbar region: Secondary | ICD-10-CM | POA: Insufficient documentation

## 2018-04-11 DIAGNOSIS — R61 Generalized hyperhidrosis: Secondary | ICD-10-CM | POA: Diagnosis not present

## 2018-04-11 DIAGNOSIS — I130 Hypertensive heart and chronic kidney disease with heart failure and stage 1 through stage 4 chronic kidney disease, or unspecified chronic kidney disease: Secondary | ICD-10-CM | POA: Diagnosis not present

## 2018-04-11 DIAGNOSIS — G4733 Obstructive sleep apnea (adult) (pediatric): Secondary | ICD-10-CM | POA: Insufficient documentation

## 2018-04-11 DIAGNOSIS — Z79899 Other long term (current) drug therapy: Secondary | ICD-10-CM | POA: Diagnosis not present

## 2018-04-11 DIAGNOSIS — F4310 Post-traumatic stress disorder, unspecified: Secondary | ICD-10-CM | POA: Insufficient documentation

## 2018-04-11 DIAGNOSIS — F411 Generalized anxiety disorder: Secondary | ICD-10-CM | POA: Diagnosis present

## 2018-04-11 DIAGNOSIS — Z85828 Personal history of other malignant neoplasm of skin: Secondary | ICD-10-CM | POA: Insufficient documentation

## 2018-04-11 DIAGNOSIS — I5033 Acute on chronic diastolic (congestive) heart failure: Secondary | ICD-10-CM | POA: Diagnosis not present

## 2018-04-11 DIAGNOSIS — K219 Gastro-esophageal reflux disease without esophagitis: Secondary | ICD-10-CM | POA: Insufficient documentation

## 2018-04-11 DIAGNOSIS — I10 Essential (primary) hypertension: Secondary | ICD-10-CM | POA: Diagnosis present

## 2018-04-11 DIAGNOSIS — F329 Major depressive disorder, single episode, unspecified: Secondary | ICD-10-CM | POA: Insufficient documentation

## 2018-04-11 DIAGNOSIS — N183 Chronic kidney disease, stage 3 unspecified: Secondary | ICD-10-CM | POA: Diagnosis present

## 2018-04-11 DIAGNOSIS — Z6841 Body Mass Index (BMI) 40.0 and over, adult: Secondary | ICD-10-CM | POA: Diagnosis not present

## 2018-04-11 DIAGNOSIS — M797 Fibromyalgia: Secondary | ICD-10-CM | POA: Diagnosis not present

## 2018-04-11 DIAGNOSIS — J9601 Acute respiratory failure with hypoxia: Principal | ICD-10-CM | POA: Insufficient documentation

## 2018-04-11 DIAGNOSIS — Z888 Allergy status to other drugs, medicaments and biological substances status: Secondary | ICD-10-CM | POA: Diagnosis not present

## 2018-04-11 DIAGNOSIS — K580 Irritable bowel syndrome with diarrhea: Secondary | ICD-10-CM | POA: Insufficient documentation

## 2018-04-11 DIAGNOSIS — Z885 Allergy status to narcotic agent status: Secondary | ICD-10-CM | POA: Insufficient documentation

## 2018-04-11 DIAGNOSIS — M353 Polymyalgia rheumatica: Secondary | ICD-10-CM | POA: Insufficient documentation

## 2018-04-11 DIAGNOSIS — J45909 Unspecified asthma, uncomplicated: Secondary | ICD-10-CM | POA: Diagnosis not present

## 2018-04-11 DIAGNOSIS — R0602 Shortness of breath: Secondary | ICD-10-CM

## 2018-04-11 DIAGNOSIS — M109 Gout, unspecified: Secondary | ICD-10-CM | POA: Diagnosis not present

## 2018-04-11 NOTE — Telephone Encounter (Signed)
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   Reason for Disposition . [1] MILD difficulty breathing (e.g., minimal/no SOB at rest, SOB with walking, pulse <100) AND [2] NEW-onset or WORSE than normal  Answer Assessment - Initial Assessment Questions 1. RESPIRATORY STATUS: "Describe your breathing?" (e.g., wheezing, shortness of breath, unable to speak, severe coughing)      Short of breath 2. ONSET: "When did this breathing problem begin?"      04/09/18 after right shoulder surgery 3. PATTERN "Does the difficult breathing come and go, or has it been constant since it started?"      Constant; worse with exertion 4. SEVERITY: "How bad is your breathing?" (e.g., mild, moderate, severe)    - MILD: No SOB at rest, mild SOB with walking, speaks normally in sentences, can lay down, no retractions, pulse < 100.    - MODERATE: SOB at rest, SOB with minimal exertion and prefers to sit, cannot lie down flat, speaks in phrases, mild retractions, audible wheezing, pulse 100-120.    - SEVERE: Very SOB at rest, speaks in single words, struggling to breathe, sitting hunched forward, retractions, pulse > 120      Mild; sleeping in recliner due to surgery 5. RECURRENT SYMPTOM: "Have you had difficulty breathing before?" If so, ask: "When was the last time?" and "What happened that time?"      no 6. CARDIAC HISTORY: "Do you have any history of heart disease?"  (e.g., heart attack, angina, bypass surgery, angioplasty)      Hypertension, first degree heart block 7. LUNG HISTORY: "Do you have any history of lung disease?"  (e.g., pulmonary embolus, asthma, emphysema)    Asthma, sleep apnea, history of bronchitis 8. CAUSE: "What do you think is causing the breathing problem?"      Not sure 9. OTHER SYMPTOMS: "Do you have any other symptoms? (e.g., dizziness, runny nose, cough, chest pain, fever)     Non-productive cough 10. PREGNANCY: "Is there any chance you are pregnant?" "When was your last menstrual period?"       no 11. TRAVEL: "Have you traveled out of the country in the last month?" (e.g., travel history, exposures)       no  Protocols used: BREATHING DIFFICULTY-A-AH

## 2018-04-11 NOTE — ED Provider Notes (Signed)
[facility] DEPT Provider Note   CSN: [id number] Arrival date & time: 04/11/18  2055     History   Chief Complaint Chief Complaint  Patient presents with  . Shortness of Breath  . Post-op Problem    HPI Valerie [name] is a 57 y.o. female with recent's shoulder surgery on 04/09/2018 who presents with shortness of breath.  She reports she has been short of breath since her admission and it has progressively worsened.  She has had difficulty taking a deep breath.  She denies any chest pain.  She reports she did have a nerve block that is still in effect to some degree on her right arm.  Her pain has been minimal in her shoulder.  She does note that it is a little sore.  She denies any fevers at home.  She has no history of blood clots and she is not on any blood thinners.  She denies any abdominal pain, nausea, vomiting, urinary symptoms.  She notes that she has been sweaty as well.  She notes the day before her surgery she had some pain in her lower legs, however this is resolved.  HPI  Past Medical History:  Diagnosis Date  . Acute ischemic colitis ([place])   . Anemia 05/01/2012  . Anxiety   . ARF (acute renal failure) ([place]) 12/28/2015  . Arthritis    "basically ~ q joint" (12/28/2015)  . Asthma   . Basal cell carcinoma of upper back ~ 2011  . Chronic right-sided headaches    and neck pain, relieved wiht PT and chronic practic manipulation  . Complete tear of right rotator cuff, recurrent 04/09/2018  . DDD (degenerative disc disease), lumbar   . Diarrhea    EPISODIC  . Fatigue    h/o excessive sedation, etiology unknown  . Fibromyalgia   . First degree heart block   . GERD (gastroesophageal reflux disease) 04/06/2017  . Headache(784.0)    "muscular w/migraine intensity; ~ 5-6 months" (12/28/2015)  . History of ischemic colitis 09/24/2016  . Hypertension   . Mental disorder    reports PTSD  . Microscopic polyangiitis ([place])   . Nerve pain 2011    nerve damage to right arm; normal EMG/NCV to RUExt after shoulder surgery 12/2009  . Other bursal cyst, right shoulder 04/09/2018  . Pancreatitis    due to Effexor  . Pneumonia ~ 2014; 08/2015   Aspiration  . Polymyalgia rheumatica ([place]) onset 08/2015   changed diagnosis to MPA  . Precancerous melanosis ([place])    "2nd toe left foot; 5th toe of right foot; right temple; had them all cut off"  . Sleep apnea    CPAP-9    Patient Active Problem List   Diagnosis Date Noted  . HTN (hypertension) 04/12/2018  . SOB (shortness of breath) 04/12/2018  . Acute respiratory failure with hypoxia ([place]) 04/12/2018  . Other bursal cyst, right shoulder, AC joint into trapezius 04/09/2018  . Persistent hypersomnia 10/11/2017  . HLA B27 (HLA B27 positive) 10/11/2017  . 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 Generalized edema 09/20/2017  . Abdominal pain, chronic, generalized 09/20/2017  . Anxiety state 09/20/2017  . Chronic, continuous use of opioids 04/06/2017  . GERD (gastroesophageal reflux disease) 04/06/2017  . Cortisol deficiency ([place]) 01/31/2017  . Multiple stiff joints 01/31/2017  . Glucocorticoids and synthetic analogues causing adverse effect in therapeutic use 01/14/2017  . Hypothalamic-adrenal dysfunction ([place]) 01/14/2017  . Microscopic polyangiitis ([place]) 11/25/2016  .  Neuropathic pain 11/23/2016  . P-ANCA and MPO antibodies positive 10/11/2016  . Dehydration 09/25/2016  . Impaired glucose tolerance 09/25/2016  . Lower GI bleed   . History of ischemic colitis 09/24/2016  . Colitis, regional, with rectal bleeding ([place]) 09/24/2016  . Gastroparesis 09/18/2016  . Flank pain 09/18/2016  . Nausea without vomiting 09/18/2016  . CKD (chronic kidney disease) stage 3, GFR 30-59 ml/min (HCC)   . Obesity, morbid ([place]) 10/21/2012  . OSA on CPAP 10/21/2012  . Fibromyalgia 06/22/2012  . Back pain 06/22/2012  . Recurrent pancreatitis 06/22/2012  .  Depression 06/22/2012  . Anemia 05/01/2012  . Leukocytosis 04/28/2012  . Colon polyp 08/01/2011  . PTSD (post-traumatic stress disorder) 08/01/2011  . Hypertension 08/01/2011  . IBS (irritable bowel syndrome) 08/01/2011    Past Surgical History:  Procedure Laterality Date  . BASAL CELL CARCINOMA EXCISION  ~ 2011   back  . BOTOX INJECTION N/A 11/06/2012   Procedure: BOTOX INJECTION;  Surgeon: Missy Sabins, MD;  Location: [facility];  Service: Endoscopy;  Laterality: N/A;  . BOTOX INJECTION N/A 12/04/2013   Procedure: BOTOX INJECTION;  Surgeon: Missy Sabins, MD;  Location: [facility];  Service: Endoscopy;  Laterality: N/A;  . BOTOX INJECTION N/A 02/26/2014   Procedure: BOTOX INJECTION;  Surgeon: Missy Sabins, MD;  Location: [place] [facility];  Service: Endoscopy;  Laterality: N/A;  . BOTOX INJECTION N/A 07/08/2015   Procedure: BOTOX INJECTION;  Surgeon: Teena Irani, MD;  [facility]: [facility];  Service: Endoscopy;  Laterality: N/A;  . BOTOX INJECTION N/A 04/27/2016   Procedure: BOTOX INJECTION;  Surgeon: Teena Irani, MD;  [facility]: [facility];  Service: Endoscopy;  Laterality: N/A;  . BOTOX INJECTION N/A 04/09/2017   Procedure: BOTOX INJECTION;  Surgeon: Clarene Essex, MD;  Location: [facility];  Service: Endoscopy;  Laterality: N/A;  . ESOPHAGOGASTRODUODENOSCOPY  12/21/2011   Procedure: ESOPHAGOGASTRODUODENOSCOPY (EGD);  Surgeon: Missy Sabins, MD;  Location[facility] ENDOSCOPY;  Service: Endoscopy;  Laterality: N/A;  . ESOPHAGOGASTRODUODENOSCOPY N/A 11/06/2012   Procedure: ESOPHAGOGASTRODUODENOSCOPY (EGD);  Surgeon: Missy Sabins, MD;  Location: [facility];  Service: Endoscopy;  Laterality: N/A;  . ESOPHAGOGASTRODUODENOSCOPY N/A 12/04/2013   Procedure: ESOPHAGOGASTRODUODENOSCOPY (EGD);  Surgeon: Missy Sabins, MD;  Location: [facility] ENDOSCOPY;  Service: Endoscopy;  Laterality: N/A;  . ESOPHAGOGASTRODUODENOSCOPY N/A 02/26/2014   Procedure: ESOPHAGOGASTRODUODENOSCOPY (EGD);  Surgeon: Missy Sabins, MD;   Location: [facility] ENDOSCOPY;  Service: Endoscopy;  Laterality: N/A;  . ESOPHAGOGASTRODUODENOSCOPY N/A 04/09/2017   Procedure: ESOPHAGOGASTRODUODENOSCOPY (EGD);  Surgeon: Clarene Essex, MD;  Location: [place];  Service: Endoscopy;  Laterality: N/A;  . ESOPHAGOGASTRODUODENOSCOPY (EGD) WITH PROPOFOL N/A 07/08/2015   Procedure: ESOPHAGOGASTRODUODENOSCOPY (EGD) WITH PROPOFOL;  Surgeon: Teena Irani, MD;  [facility];  Service: Endoscopy;  Laterality: N/A;  . ESOPHAGOGASTRODUODENOSCOPY (EGD) WITH PROPOFOL N/A 04/27/2016   Procedure: ESOPHAGOGASTRODUODENOSCOPY (EGD) WITH PROPOFOL;  Surgeon: Teena Irani, MD;  [facility];  Service: Endoscopy;  Laterality: N/A;  . EXCISION MASS UPPER EXTREMETIES Right 04/09/2018   Procedure: EXCISION RIGHT SHOULDER CYST;  Surgeon: Marchia Bond, MD;  Location: [facility];  Service: Orthopedics;  Laterality: Right;  Interscalene Block  . FLEXIBLE SIGMOIDOSCOPY  05/01/2012   Procedure: FLEXIBLE SIGMOIDOSCOPY;  Surgeon: Jeryl Columbia, MD;  [facility];  Service: Endoscopy;  Laterality: N/A;  unprepped  . FLEXIBLE SIGMOIDOSCOPY Left 09/28/2016   Procedure: FLEXIBLE SIGMOIDOSCOPY;  Surgeon: Wilford Corner, MD;  Location: [facility];  Service: Endoscopy;  Laterality: Left;  . LAPAROSCOPIC CHOLECYSTECTOMY  01/2002  .  NASAL SEPTOPLASTY W/ TURBINOPLASTY  1998  . SHOULDER ARTHROSCOPY WITH ROTATOR CUFF REPAIR Right 04/09/2018   Procedure: RIGHT SHOULDER ARTHROSCOPY WITH EXTENSIVE DEBRIDEMENT;  Surgeon: Marchia Bond, MD;  Location: [facility];  Service: Orthopedics;  Laterality: Right;  Interscalene Block  . SHOULDER ARTHROSCOPY WITH ROTATOR CUFF REPAIR AND OPEN BICEPS TENODESIS Bilateral 2010-2013   left-right     OB History   None      Home Medications    Prior to Admission medications   Medication Sig Start Date End Date Taking? Authorizing Provider  albuterol (PROAIR HFA) 108 (90 Base) MCG/ACT inhaler Inhale 2 puffs every 4 (four) hours as needed  into the lungs for wheezing or shortness of breath. 04/23/17  Yes Shawnee Knapp, MD  allopurinol (ZYLOPRIM) 300 MG tablet TAKE 1/2 TABLET BY MOUTH ONCE A DAY Patient taking differently: Take 150-300 mg by mouth daily.  12/31/17  Yes Shawnee Knapp, MD  ALPRAZolam Duanne Moron) 1 MG tablet TAKE ONE TABLET BY MOUTH THREE TIMES A DAY AS NEEDED FOR ANXIETY Patient taking differently: 1 mg 3 (three) times daily as needed for anxiety.  02/22/18  Yes Shawnee Knapp, MD  baclofen (LIORESAL) 10 MG tablet Take 10 mg by mouth 3 (three) times daily as needed for muscle spasms.    Yes [provider]  benazepril (LOTENSIN) 20 MG tablet Take 1 tablet (20 mg total) daily by mouth. 04/23/17  Yes Shawnee Knapp, MD  Calcium Carb-Cholecalciferol (CALCIUM 600 + D PO) Take 0.5 tablets by mouth daily.   Yes [provider]  cetirizine (ZYRTEC) 10 MG tablet Take 10 mg by mouth daily.    Yes [provider]  Cholecalciferol (VITAMIN D3) 5000 units CAPS Take 5,000 Units by mouth daily.    Yes [provider]  dexlansoprazole (DEXILANT) 60 MG capsule Take 60 mg by mouth 2 (two) times daily.   Yes [provider]  fluticasone (FLONASE) 50 MCG/ACT nasal spray PLACE 2 SPRAYS INTO BOTH NOSTRILS DAILY Patient taking differently: Place 2 sprays into both nostrils at bedtime.  11/28/17  Yes Shawnee Knapp, MD  HYDROcodone-acetaminophen [facility]) 10-325 MG per tablet Take 1-2 tablets by mouth every 6 (six) hours as needed for moderate pain (pain.).    Yes [provider]  hydrOXYzine (ATARAX/VISTARIL) 10 MG tablet Take 1-3 tablets (10-30 mg total) by mouth every 6 (six) hours as needed for anxiety. Patient taking differently: Take 10-30 mg by mouth 3 (three) times daily as needed for anxiety.  02/22/18  Yes Shawnee Knapp, MD  ibuprofen (ADVIL,MOTRIN) 600 MG tablet Take 600 mg by mouth daily as needed (migraines).   Yes [provider]  Melatonin 5 MG TABS Take 5-10 mg by mouth at bedtime as needed  (sleep).   Yes [provider]  metoprolol succinate (TOPROL-XL) 100 MG 24 hr tablet Take 1 tablet (100 mg total) by mouth daily. Take with or immediately following a meal. 07/09/17  Yes Shawnee Knapp, MD  modafinil (PROVIGIL) 200 MG tablet Take 0.5 tablets (100 mg total) by mouth 2 (two) times daily. Patient taking differently: Take 100 mg by mouth See admin instructions. Take 100 mg every morning may take a second 100 mg dose as needed for narcolepsy 10/11/17  Yes Dohmeier, Asencion Partridge, MD  montelukast (SINGULAIR) 10 MG tablet TAKE ONE TABLET BY MOUTH EVERY NIGHT AT BEDTIME Patient taking differently: Take 10 mg by mouth at bedtime.  08/15/17  Yes Shawnee Knapp, MD  morphine (MS CONTIN)  30 MG 12 hr tablet Take 30 mg by mouth every 8 (eight) hours. Not from [facility] 09/23/12  Yes [provider]  Multiple Vitamins-Minerals (MULTIVITAMIN PO) Take 1 tablet by mouth daily.   Yes [provider]  Olopatadine HCl (PAZEO) 0.7 % SOLN Apply 1 drop to eye daily. Patient taking differently: Apply 1 drop to eye daily as needed (allergies).  07/09/17  Yes Shawnee Knapp, MD  ondansetron (ZOFRAN) 4 MG tablet Take 1 tablet (4 mg total) by mouth every 8 (eight) hours as needed for nausea or vomiting. 11/18/16  Yes Tereasa Coop, PA-C  promethazine (PHENERGAN) 25 MG tablet Take 1 tablet (25 mg total) by mouth every 6 (six) hours as needed for nausea or vomiting. 11/18/16  Yes Tereasa Coop, PA-C  pseudoephedrine (SUDAFED) 120 MG 12 hr tablet Take 120 mg by mouth daily as needed for congestion.   Yes [provider]  spironolactone (ALDACTONE) 100 MG tablet Take 1 tablet (100 mg total) by mouth daily. 12/29/17  Yes Shawnee Knapp, MD  sucralfate (CARAFATE) 1 GM/10ML suspension Take 10 mLs (1 g total) by mouth 4 (four) times daily as needed (stomach pain). Patient taking differently: Take 1 g by mouth 2 (two) times daily as needed (stomach pain).  09/20/17  Yes Shawnee Knapp, MD  torsemide (DEMADEX) 10 MG  tablet Take 1 tablet (10 mg total) by mouth daily. Patient taking differently: Take 10 mg by mouth daily as needed (edema).  02/22/18  Yes Shawnee Knapp, MD  fexofenadine (ALLEGRA) 180 MG tablet Take 180 mg by mouth daily. Switches between Allegra and Zyrtec as needed for allergies    [provider]  ipratropium (ATROVENT) 0.03 % nasal spray Place 2 sprays 4 (four) times daily into the nose. Patient not taking: Reported on 04/12/2018 04/23/17   Shawnee Knapp, MD  Pancrelipase, Lip-Prot-Amyl, (CREON) 24000 UNITS CPEP Take 72,000 Units by mouth 3 (three) times daily as needed (for pancreatitis).     [provider]    Family History Family History  Problem Relation Age of Onset  . Heart disease Mother   . COPD Mother   . Arthritis Mother   . Cancer Mother   . Hypertension Mother   . Arthritis Paternal Grandmother   . Heart disease Paternal Grandmother   . Heart disease Paternal Grandfather     Social History Social History   Tobacco Use  . Smoking status: Never Smoker  . Smokeless tobacco: Never Used  Substance Use Topics  . Alcohol use: No  . Drug use: No     Allergies   Dilaudid [hydromorphone hcl]; Effexor [venlafaxine]; Pregabalin; Adhesive [tape]; Metoclopramide hcl; Neurontin [gabapentin]; Other; and Serotonin reuptake inhibitors (ssris)   Review of Systems Review of Systems  Constitutional: Positive for diaphoresis. Negative for chills and fever.  HENT: Negative for facial swelling and sore throat.   Respiratory: Positive for shortness of breath.   Cardiovascular: Negative for chest pain.  Gastrointestinal: Negative for abdominal pain, nausea and vomiting.  Genitourinary: Negative for dysuria.  Musculoskeletal: Negative for back pain.  Skin: Negative for rash and wound.  Neurological: Negative for headaches.  Psychiatric/Behavioral: The patient is not nervous/anxious.      Physical Exam Updated Vital Signs BP 120/74   Pulse 67   Temp 98.5 F  (36.9 C) (Oral)   Resp 15   SpO2 94%   Physical Exam  Constitutional: She appears well-developed and well-nourished. No distress.  HENT:  Head: Normocephalic  and atraumatic.  Mouth/Throat: Oropharynx is clear and moist. No oropharyngeal exudate.  Eyes: Pupils are equal, round, and reactive to light. Conjunctivae are normal. Right eye exhibits no discharge. Left eye exhibits no discharge. No scleral icterus.  Neck: Normal range of motion. Neck supple. No thyromegaly present.  Cardiovascular: Normal rate, regular rhythm, normal heart sounds and intact distal pulses. Exam reveals no gallop and no friction rub.  No murmur heard. Pulmonary/Chest: Effort normal and breath sounds normal. No stridor. No respiratory distress. She has no wheezes. She has no rales.  Abdominal: Soft. Bowel sounds are normal. She exhibits no distension. There is no tenderness. There is no rebound and no guarding.  Musculoskeletal: She exhibits no edema.       Right lower leg: She exhibits no tenderness and no edema.       Left lower leg: She exhibits no tenderness and no edema.  Large bandage in place to right shoulder; wounds visualized, covered with Steri-Strips, no surrounding erythema or drainage Range of motion intact to the right shoulder without significant pain  Lymphadenopathy:    She has no cervical adenopathy.  Neurological: She is alert. Coordination normal.  Skin: Skin is warm and dry. No rash noted. She is not diaphoretic. No pallor.  Psychiatric: She has a normal mood and affect.  Nursing note and vitals reviewed.    ED Treatments / Results  Labs (all labs ordered are listed, but only abnormal results are displayed) Labs Reviewed  BASIC METABOLIC PANEL - Abnormal; Notable for the following components:      Result Value   Glucose, Bld 116 (*)    BUN 22 (*)    Creatinine, Ser 1.45 (*)    GFR calc non Af Amer 40 (*)    GFR calc Af Amer 46 (*)    All other components within normal limits  CBC  WITH DIFFERENTIAL/PLATELET - Abnormal; Notable for the following components:   WBC 10.6 (*)    All other components within normal limits  I-STAT TROPONIN, ED  POCT I-STAT TROPONIN I    EKG EKG Interpretation  Date/Time:  Thursday April 11 2018 21:11:24 EDT Ventricular Rate:  80 PR Interval:    QRS Duration: 90 QT Interval:  377 QTC Calculation: 435 R Axis:   45 Text Interpretation:  Sinus rhythm Baseline wander in lead(s) II III aVF Since last tracing rate slower Confirmed by Isla Pence [phone]) on 04/11/2018 10:26:39 PM   Radiology Dg Chest 2 View  Result Date: 04/11/2018 CLINICAL DATA:  Initial evaluation for acute shortness of breath. EXAM: CHEST - 2 VIEW COMPARISON:  Prior radiograph from 11/18/2016. FINDINGS: Cardiac and mediastinal silhouettes are stable in size and contour, and remain within normal limits. Lungs are hypoinflated. Scattered linear bibasilar opacities most consistent with atelectasis. No focal infiltrates. No pulmonary edema or pleural effusion. No pneumothorax. No acute osseous abnormality. IMPRESSION: 1. Shallow lung inflation with associated bibasilar atelectasis. 2. No other active cardiopulmonary disease. Electronically Signed   By: Jeannine Boga M.D.   On: 04/11/2018 22:23   Ct Angio Chest Pe W And/or Wo Contrast  Result Date: 04/12/2018 CLINICAL DATA:  57 year old female with shortness of breath. Recent surgery. Concern for pulmonary embolism. EXAM: CT ANGIOGRAPHY CHEST WITH CONTRAST TECHNIQUE: Multidetector CT imaging of the chest was performed using the standard protocol during bolus administration of intravenous contrast. Multiplanar CT image reconstructions and MIPs were obtained to evaluate the vascular anatomy. CONTRAST:  165m ISOVUE-370 IOPAMIDOL (ISOVUE-370) INJECTION 76% COMPARISON:  Chest radiograph  dated 04/11/2018 and chest CT dated 02/03/2014 FINDINGS: Cardiovascular: There is borderline cardiomegaly. No pericardial effusion. The  thoracic aorta is unremarkable. Evaluation of the pulmonary arteries is limited due to suboptimal visualization of the peripheral branches and respiratory motion artifact. No large or central pulmonary artery embolus identified. Mediastinum/Nodes: There is no hilar or mediastinal adenopathy. Esophagus and the thyroid gland are grossly unremarkable. No mediastinal fluid collection. Lungs/Pleura: There is eventration of the right hemidiaphragm. Right lung base linear and streaky densities most consistent with atelectasis/scarring. A subsegmental area of consolidation in the medial basal right lung base most likely atelectasis are low infiltrate is not excluded. Clinical correlation is recommended. There is no pleural effusion or pneumothorax. The central airways are patent. Upper Abdomen: Cholecystectomy. The visualized upper abdomen is otherwise unremarkable. Musculoskeletal: No chest wall abnormality. No acute or significant osseous findings. Review of the MIP images confirms the above findings. IMPRESSION: 1. No CT evidence of central pulmonary artery embolus. 2. Right lung base linear atelectasis/scarring. A subsegmental area of consolidation, likely atelectasis. Pneumonia is less likely. Clinical correlation is recommended. Electronically Signed   By: Anner Crete M.D.   On: 04/12/2018 02:07    Procedures Procedures (including critical care time)  Medications Ordered in ED Medications  sodium chloride 0.9 % injection (has no administration in time range)  morphine 4 MG/ML injection 4 mg (4 mg Intravenous Given 04/12/18 0031)  iopamidol (ISOVUE-370) 76 % injection 100 mL (100 mLs Intravenous Contrast Given 04/12/18 0101)     Initial Impression / Assessment and Plan / ED Course  I have reviewed the triage vital signs and the nursing notes.  Pertinent labs & imaging results that were available during my care of the patient were reviewed by me and considered in my medical decision making (see chart  for details).     Patient with shortness of breath and hypoxia on exertion after shoulder surgery 2 days ago.  Patient is negative for PE.  Her CT angios chest did show some subsegmental consolidation, likely atelectasis.  Low suspicion for pneumonia and pneumonia was less favored on CT.  Patient is afebrile.  Her shortness of breath began after her surgery and has persisted.  She is very short of breath on exertion and dropped to 82% with ambulation in the ED.  Labs are unremarkable, except for BUN 22, creatinine 1.45 and WBC 10.6.  Concern for underlying process and patient now has oxygen requirement and I feel patient should be admitted for further evaluation.  I discussed patient case with Dr. Blaine Hamper with [facility] who accepts patient for admission.  I appreciate his assistance with the patient.  Patient also evaluated by my attending, Dr. Gilford Raid, who guided the patient's management and agrees with plan.  Final Clinical Impressions(s) / ED Diagnoses   Final diagnoses:  Shortness of breath    ED Discharge Orders    None       Frederica Kuster, PA-C 04/12/18 2585    Isla Pence, MD 04/12/18 1515

## 2018-04-11 NOTE — ED Triage Notes (Addendum)
Pt from home following surgery on right shoulder. Pt states she removed a mass in the shoulder. Pt states she has had SOB and diaphoresis since the surgery. Pt states she has not informed her surgeon of this. Pt has o2 sat of 96% RA and is able to speak in complete sentences. Pt is diaphoretic at time of assessment. Pt has clear lung sounds

## 2018-04-11 NOTE — Telephone Encounter (Signed)
Call from answering service, then connected to the patient.  Rotator cuff surgery 2 days ago, no prior dyspnea but history of asthma without recent use of inhaler.  Slight cough after surgery and yesterday, but reports extreme shortness of breath today.  She has not used her inhaler in a year, used inhaler twice today without relief.  Still dyspneic.  Unable to take a full deep breath, rattling with deep expiration.  -Differential discussed over the phone, including asthma exacerbation, less likely pneumothorax or pulmonary embolus, but with recent surgery recommended emergent evaluation through ER tonight.  Advised to have someone take her right away or call 911 if any acute worsening of symptoms.  Understanding expressed.

## 2018-04-12 ENCOUNTER — Other Ambulatory Visit: Payer: Self-pay

## 2018-04-12 ENCOUNTER — Emergency Department (HOSPITAL_COMMUNITY): Payer: Medicare Other

## 2018-04-12 ENCOUNTER — Encounter (HOSPITAL_COMMUNITY): Payer: Self-pay

## 2018-04-12 DIAGNOSIS — I10 Essential (primary) hypertension: Secondary | ICD-10-CM | POA: Diagnosis not present

## 2018-04-12 DIAGNOSIS — N183 Chronic kidney disease, stage 3 (moderate): Secondary | ICD-10-CM

## 2018-04-12 DIAGNOSIS — I5033 Acute on chronic diastolic (congestive) heart failure: Secondary | ICD-10-CM | POA: Diagnosis not present

## 2018-04-12 DIAGNOSIS — K219 Gastro-esophageal reflux disease without esophagitis: Secondary | ICD-10-CM | POA: Diagnosis not present

## 2018-04-12 DIAGNOSIS — R0602 Shortness of breath: Secondary | ICD-10-CM | POA: Diagnosis not present

## 2018-04-12 DIAGNOSIS — Z9989 Dependence on other enabling machines and devices: Secondary | ICD-10-CM | POA: Diagnosis not present

## 2018-04-12 DIAGNOSIS — F411 Generalized anxiety disorder: Secondary | ICD-10-CM | POA: Diagnosis not present

## 2018-04-12 DIAGNOSIS — I5032 Chronic diastolic (congestive) heart failure: Secondary | ICD-10-CM | POA: Insufficient documentation

## 2018-04-12 DIAGNOSIS — G4733 Obstructive sleep apnea (adult) (pediatric): Secondary | ICD-10-CM | POA: Diagnosis not present

## 2018-04-12 DIAGNOSIS — J9601 Acute respiratory failure with hypoxia: Secondary | ICD-10-CM | POA: Diagnosis not present

## 2018-04-12 LAB — CBC WITH DIFFERENTIAL/PLATELET
Abs Immature Granulocytes: 0.05 10*3/uL (ref 0.00–0.07)
Basophils Absolute: 0.1 10*3/uL (ref 0.0–0.1)
Basophils Relative: 1 %
Eosinophils Absolute: 0.2 10*3/uL (ref 0.0–0.5)
Eosinophils Relative: 2 %
HCT: 39.5 % (ref 36.0–46.0)
Hemoglobin: 12.6 g/dL (ref 12.0–15.0)
Immature Granulocytes: 1 %
Lymphocytes Relative: 36 %
Lymphs Abs: 3.8 10*3/uL (ref 0.7–4.0)
MCH: 30 pg (ref 26.0–34.0)
MCHC: 31.9 g/dL (ref 30.0–36.0)
MCV: 94 fL (ref 80.0–100.0)
Monocytes Absolute: 0.5 10*3/uL (ref 0.1–1.0)
Monocytes Relative: 5 %
Neutro Abs: 6 10*3/uL (ref 1.7–7.7)
Neutrophils Relative %: 55 %
Platelets: 282 10*3/uL (ref 150–400)
RBC: 4.2 MIL/uL (ref 3.87–5.11)
RDW: 13.6 % (ref 11.5–15.5)
WBC: 10.6 10*3/uL — ABNORMAL HIGH (ref 4.0–10.5)
nRBC: 0 % (ref 0.0–0.2)

## 2018-04-12 LAB — TROPONIN I: Troponin I: <0.03 ng/mL (ref <0.03)

## 2018-04-12 LAB — HIV ANTIBODY (ROUTINE TESTING W REFLEX): HIV Screen 4th Generation wRfx: NONREACTIVE

## 2018-04-12 LAB — BASIC METABOLIC PANEL
Anion gap: 6 (ref 5–15)
BUN: 22 mg/dL — ABNORMAL HIGH (ref 6–20)
CO2: 26 mmol/L (ref 22–32)
Calcium: 9.3 mg/dL (ref 8.9–10.3)
Chloride: 106 mmol/L (ref 98–111)
Creatinine, Ser: 1.45 mg/dL — ABNORMAL HIGH (ref 0.44–1.00)
GFR calc Af Amer: 46 mL/min — ABNORMAL LOW (ref >60)
GFR calc non Af Amer: 40 mL/min — ABNORMAL LOW (ref >60)
Glucose, Bld: 116 mg/dL — ABNORMAL HIGH (ref 70–99)
Potassium: 4.1 mmol/L (ref 3.5–5.1)
Sodium: 138 mmol/L (ref 135–145)

## 2018-04-12 LAB — POCT I-STAT TROPONIN I: TROPONIN I, POC: 0 ng/mL (ref 0.00–0.08)

## 2018-04-12 LAB — BRAIN NATRIURETIC PEPTIDE: B Natriuretic Peptide: 14.4 pg/mL (ref 0.0–100.0)

## 2018-04-12 MED ORDER — METOPROLOL SUCCINATE ER 100 MG PO TB24
100.0000 mg | ORAL_TABLET | Freq: Every day | ORAL | Status: DC
  Administered 2018-04-12 – 2018-04-13 (×2): 100 mg via ORAL
  Filled 2018-04-12 (×2): qty 1

## 2018-04-12 MED ORDER — HYDRALAZINE HCL 20 MG/ML IJ SOLN: 5.0000 mg | INTRAMUSCULAR | Status: DC | PRN

## 2018-04-12 MED ORDER — MODAFINIL 200 MG PO TABS: 100.0000 mg | ORAL_TABLET | Freq: Every day | ORAL | Status: DC | PRN

## 2018-04-12 MED ORDER — ENOXAPARIN SODIUM 40 MG/0.4ML ~~LOC~~ SOLN
40.0000 mg | Freq: Every day | SUBCUTANEOUS | Status: DC
  Administered 2018-04-12 – 2018-04-13 (×2): 40 mg via SUBCUTANEOUS
  Filled 2018-04-12 (×2): qty 0.4

## 2018-04-12 MED ORDER — SUCRALFATE 1 GM/10ML PO SUSP: 1.0000 g | Freq: Two times a day (BID) | ORAL | Status: DC | PRN

## 2018-04-12 MED ORDER — ONDANSETRON HCL 4 MG PO TABS: 4.0000 mg | ORAL_TABLET | Freq: Three times a day (TID) | ORAL | Status: DC | PRN

## 2018-04-12 MED ORDER — MONTELUKAST SODIUM 10 MG PO TABS
10.0000 mg | ORAL_TABLET | Freq: Every day | ORAL | Status: DC
  Administered 2018-04-12: 10 mg via ORAL
  Filled 2018-04-12: qty 1

## 2018-04-12 MED ORDER — OLOPATADINE HCL 0.1 % OP SOLN: 1.0000 [drp] | Freq: Every day | OPHTHALMIC | Status: DC | PRN

## 2018-04-12 MED ORDER — FUROSEMIDE 10 MG/ML IJ SOLN: 40.0000 mg | Freq: Two times a day (BID) | INTRAMUSCULAR | Status: DC

## 2018-04-12 MED ORDER — SODIUM CHLORIDE 0.9 % IJ SOLN
INTRAMUSCULAR | Status: AC
  Filled 2018-04-12: qty 50

## 2018-04-12 MED ORDER — ALPRAZOLAM 1 MG PO TABS: 1.0000 mg | ORAL_TABLET | Freq: Three times a day (TID) | ORAL | Status: DC | PRN

## 2018-04-12 MED ORDER — VITAMIN D 1000 UNITS PO TABS
5000.0000 [IU] | ORAL_TABLET | Freq: Every day | ORAL | Status: DC
  Administered 2018-04-12 – 2018-04-13 (×2): 5000 [IU] via ORAL
  Filled 2018-04-12 (×2): qty 5

## 2018-04-12 MED ORDER — ALLOPURINOL 300 MG PO TABS
150.0000 mg | ORAL_TABLET | Freq: Every day | ORAL | Status: DC
  Administered 2018-04-13: 150 mg via ORAL
  Filled 2018-04-12: qty 1

## 2018-04-12 MED ORDER — LORATADINE 10 MG PO TABS
10.0000 mg | ORAL_TABLET | Freq: Every day | ORAL | Status: DC
  Administered 2018-04-12 – 2018-04-13 (×2): 10 mg via ORAL
  Filled 2018-04-12 (×2): qty 1

## 2018-04-12 MED ORDER — ALLOPURINOL 300 MG PO TABS
150.0000 mg | ORAL_TABLET | Freq: Every day | ORAL | Status: DC
  Administered 2018-04-12: 150 mg via ORAL
  Filled 2018-04-12: qty 1

## 2018-04-12 MED ORDER — PANTOPRAZOLE SODIUM 40 MG PO TBEC
40.0000 mg | DELAYED_RELEASE_TABLET | Freq: Every day | ORAL | Status: DC
  Administered 2018-04-12 – 2018-04-13 (×2): 40 mg via ORAL
  Filled 2018-04-12 (×2): qty 1

## 2018-04-12 MED ORDER — ADULT MULTIVITAMIN LIQUID CH
15.0000 mL | Freq: Every day | ORAL | Status: DC
  Filled 2018-04-12: qty 15

## 2018-04-12 MED ORDER — BENAZEPRIL HCL 20 MG PO TABS
20.0000 mg | ORAL_TABLET | Freq: Every day | ORAL | Status: DC
  Administered 2018-04-12 – 2018-04-13 (×2): 20 mg via ORAL
  Filled 2018-04-12: qty 1
  Filled 2018-04-12 (×2): qty 2
  Filled 2018-04-12: qty 1

## 2018-04-12 MED ORDER — FLUTICASONE PROPIONATE 50 MCG/ACT NA SUSP
2.0000 | Freq: Every day | NASAL | Status: DC
  Administered 2018-04-12: 2 via NASAL
  Filled 2018-04-12: qty 16

## 2018-04-12 MED ORDER — MELATONIN 5 MG PO TABS
5.0000 mg | ORAL_TABLET | Freq: Every evening | ORAL | Status: DC | PRN
  Filled 2018-04-12: qty 2

## 2018-04-12 MED ORDER — CALCIUM CARBONATE-VITAMIN D 500-200 MG-UNIT PO TABS
1.0000 | ORAL_TABLET | Freq: Every day | ORAL | Status: DC
  Administered 2018-04-12 – 2018-04-13 (×2): 1 via ORAL
  Filled 2018-04-12 (×2): qty 1

## 2018-04-12 MED ORDER — IOPAMIDOL (ISOVUE-370) INJECTION 76%
100.0000 mL | Freq: Once | INTRAVENOUS | Status: AC | PRN
  Administered 2018-04-12: 100 mL via INTRAVENOUS

## 2018-04-12 MED ORDER — SODIUM CHLORIDE 0.9% FLUSH
3.0000 mL | Freq: Two times a day (BID) | INTRAVENOUS | Status: DC
  Administered 2018-04-12 (×2): 3 mL via INTRAVENOUS

## 2018-04-12 MED ORDER — ASPIRIN EC 81 MG PO TBEC: 81.0000 mg | DELAYED_RELEASE_TABLET | Freq: Every day | ORAL | Status: DC

## 2018-04-12 MED ORDER — HYDROXYZINE HCL 10 MG PO TABS
10.0000 mg | ORAL_TABLET | Freq: Three times a day (TID) | ORAL | Status: DC | PRN
  Filled 2018-04-12: qty 3

## 2018-04-12 MED ORDER — BACLOFEN 10 MG PO TABS: 10.0000 mg | ORAL_TABLET | Freq: Three times a day (TID) | ORAL | Status: DC | PRN

## 2018-04-12 MED ORDER — SPIRONOLACTONE 100 MG PO TABS
100.0000 mg | ORAL_TABLET | Freq: Every day | ORAL | Status: DC
  Administered 2018-04-12 – 2018-04-13 (×2): 100 mg via ORAL
  Filled 2018-04-12: qty 4
  Filled 2018-04-12: qty 1
  Filled 2018-04-12: qty 4
  Filled 2018-04-12: qty 1

## 2018-04-12 MED ORDER — SODIUM CHLORIDE 0.9% FLUSH: 3.0000 mL | INTRAVENOUS | Status: DC | PRN

## 2018-04-12 MED ORDER — ADULT MULTIVITAMIN W/MINERALS CH
1.0000 | ORAL_TABLET | Freq: Every day | ORAL | Status: DC
  Administered 2018-04-12 – 2018-04-13 (×2): 1 via ORAL
  Filled 2018-04-12 (×2): qty 1

## 2018-04-12 MED ORDER — SODIUM CHLORIDE 0.9 % IV SOLN: 250.0000 mL | INTRAVENOUS | Status: DC | PRN

## 2018-04-12 MED ORDER — MORPHINE SULFATE ER 30 MG PO TBCR
30.0000 mg | EXTENDED_RELEASE_TABLET | Freq: Three times a day (TID) | ORAL | Status: DC
  Administered 2018-04-12 – 2018-04-13 (×4): 30 mg via ORAL
  Filled 2018-04-12 (×4): qty 1

## 2018-04-12 MED ORDER — MORPHINE SULFATE (PF) 4 MG/ML IV SOLN
4.0000 mg | Freq: Once | INTRAVENOUS | Status: AC
  Administered 2018-04-12: 4 mg via INTRAVENOUS
  Filled 2018-04-12: qty 1

## 2018-04-12 MED ORDER — ALBUTEROL SULFATE (2.5 MG/3ML) 0.083% IN NEBU: 2.5000 mg | INHALATION_SOLUTION | RESPIRATORY_TRACT | Status: DC | PRN

## 2018-04-12 MED ORDER — IOPAMIDOL (ISOVUE-370) INJECTION 76%
INTRAVENOUS | Status: AC
  Filled 2018-04-12: qty 100

## 2018-04-12 MED ORDER — HYDROCODONE-ACETAMINOPHEN 10-325 MG PO TABS
1.0000 | ORAL_TABLET | Freq: Four times a day (QID) | ORAL | Status: DC | PRN
  Administered 2018-04-12 – 2018-04-13 (×2): 2 via ORAL
  Filled 2018-04-12 (×2): qty 2

## 2018-04-12 MED ORDER — ACETAMINOPHEN 325 MG PO TABS: 650.0000 mg | ORAL_TABLET | ORAL | Status: DC | PRN

## 2018-04-12 MED ORDER — MODAFINIL 200 MG PO TABS
100.0000 mg | ORAL_TABLET | Freq: Every day | ORAL | Status: DC
  Administered 2018-04-13: 100 mg via ORAL
  Filled 2018-04-12 (×2): qty 1

## 2018-04-12 MED ORDER — PANCRELIPASE (LIP-PROT-AMYL) 12000-38000 UNITS PO CPEP: 12000.0000 [IU] | ORAL_CAPSULE | Freq: Three times a day (TID) | ORAL | Status: DC | PRN

## 2018-04-12 NOTE — Progress Notes (Signed)
I have seen and assessed patient and agree with Dr.Niu's assessment and plan.  Patient is an obese 57 year old female history of chronic diastolic CHF, hypertension, asthma, GERD, gout, anxiety, fibromyalgia, PMR, OSA on CPAP, chronic kidney disease stage III presented to the ED with worsening shortness of breath.  Patient recently underwent elective right shoulder rotator cuff surgery 2 days prior to admission and presenting with worsening shortness of breath.  CT angiogram chest done in the ED was negative for PE.  Chest x-ray negative for any acute infiltrate.  Patient afebrile, no tachycardia.  Patient admitted for worsening shortness of breath.  Patient on incentive spirometry.  Supportive care.  Follow.  No charge.

## 2018-04-12 NOTE — H&P (Addendum)
History and Physical    [name]:[id number] DOB: 1961/04/22 DOA: 04/11/2018  Referring MD/NP/PA:   PCP: Shawnee Knapp, MD   Patient coming from:  The patient is coming from home.  At baseline, pt is independent for most of ADL.   Chief Complaint: SOB  HPI: Valerie [name] is a 57 y.o. female with medical history significant of dCHF, hypertension, asthma, GERD, gout, anxiety, ischemic colitis, fibromyalgia, pancreatitis, PMR, obesity, OSA on CPAP, CKD-3,who presents with shortness of breath  Patient states that she underwent right shoulder rotator cuff surgery 2 days ago.  She tolerated surgery well.  The surgical site has been healing well.  Patient states that she started having shortness of breath when she woke up from surgery, which has been progressively getting worse.  She has mild dry cough, but no chest pain.  Denies fever or chills.  Using home inhaler has some help.  Patient does not have nausea, vomiting, diarrhea, abdominal pain, symptoms of UTI or unilateral weakness.  Patient states that she did not take his torsemide in the past 7 days. She still takes his spironolactone, last dose was yesterday.  She states that her BW was 290 LBs before the surgery, and now is 293 LBs in ED. Per ED physician, patient had oxygen desaturation to 82% on exertion, currently 96% when resting.   ED Course: pt was found to have WBC 10.6, negative troponin, renal function slightly worsening than baseline, no fever, no tachycardia, negative chest x-ray.  CT angiogram is negative for PE.  Chest x-ray negative.  Patient is placed on telemetry bed of observation.  Review of Systems:   General: no fevers, chills, no body weight gain, has fatigue HEENT: no blurry vision, hearing changes or sore throat Respiratory: has dyspnea, mild coughing, no wheezing CV: no chest pain, no palpitations GI: no nausea, vomiting, abdominal pain, diarrhea, constipation GU: no dysuria, burning on urination,  increased urinary frequency, hematuria  Ext: has leg edema Neuro: no unilateral weakness, numbness, or tingling, no vision change or hearing loss Skin: no rash, no skin tear. MSK: No muscle spasm, no deformity, no limitation of range of movement in spin Heme: No easy bruising.  Travel history: No recent long distant travel.  Allergy:  Allergies  Allergen Reactions  . Dilaudid [Hydromorphone Hcl] Other (See Comments)    Headache   . Effexor [Venlafaxine]     pancreatitis  . Pregabalin Other (See Comments)    Drug induced stupor  . Adhesive [Tape]     Paper tape causes blood red marks  . Metoclopramide Hcl Other (See Comments)    "crawling inside"  . Neurontin [Gabapentin] Other (See Comments)    Tremors   . Other Other (See Comments)    Avoid all SSRI's and SNRI's- damaged pancreas  . Serotonin Reuptake Inhibitors (Ssris) Other (See Comments)    Damage to pancreas and bowel/urinary incontinence     Past Medical History:  Diagnosis Date  . Acute ischemic colitis ([place])   . Anemia 05/01/2012  . Anxiety   . ARF (acute renal failure) ([place]) 12/28/2015  . Arthritis    "basically ~ q joint" (12/28/2015)  . Asthma   . Basal cell carcinoma of upper back ~ 2011  . Chronic right-sided headaches    and neck pain, relieved wiht PT and chronic practic manipulation  . Complete tear of right rotator cuff, recurrent 04/09/2018  . DDD (degenerative disc disease), lumbar   . Diarrhea    EPISODIC  .  Fatigue    h/o excessive sedation, etiology unknown  . Fibromyalgia   . First degree heart block   . GERD (gastroesophageal reflux disease) 04/06/2017  . Headache(784.0)    "muscular w/migraine intensity; ~ 5-6 months" (12/28/2015)  . History of ischemic colitis 09/24/2016  . Hypertension   . Mental disorder    reports PTSD  . Microscopic polyangiitis ([place])   . Nerve pain 2011   nerve damage to right arm; normal EMG/NCV to RUExt after shoulder surgery 12/2009  . Other bursal cyst, right  shoulder 04/09/2018  . Pancreatitis    due to Effexor  . Pneumonia ~ 2014; 08/2015   Aspiration  . Polymyalgia rheumatica ([place]) onset 08/2015   changed diagnosis to MPA  . Precancerous melanosis ([place])    "2nd toe left foot; 5th toe of right foot; right temple; had them all cut off"  . Sleep apnea    CPAP-9    Past Surgical History:  Procedure Laterality Date  . BASAL CELL CARCINOMA EXCISION  ~ 2011   back  . BOTOX INJECTION N/A 11/06/2012   Procedure: BOTOX INJECTION;  Surgeon: Missy Sabins, MD;  [facility]: [facility];  Service: Endoscopy;  Laterality: N/A;  . BOTOX INJECTION N/A 12/04/2013   Procedure: BOTOX INJECTION;  Surgeon: Missy Sabins, MD;  Location: [place];  Service: Endoscopy;  Laterality: N/A;  . BOTOX INJECTION N/A 02/26/2014   Procedure: BOTOX INJECTION;  Surgeon: Missy Sabins, MD;  Location: [place];  Service: Endoscopy;  Laterality: N/A;  . BOTOX INJECTION N/A 07/08/2015   Procedure: BOTOX INJECTION;  Surgeon: Teena Irani, MD;  [facility];  Service: Endoscopy;  Laterality: N/A;  . BOTOX INJECTION N/A 04/27/2016   Procedure: BOTOX INJECTION;  Surgeon: Teena Irani, MD;  [facility];  Service: Endoscopy;  Laterality: N/A;  . BOTOX INJECTION N/A 04/09/2017   Procedure: BOTOX INJECTION;  Surgeon: Clarene Essex, MD;  Location: [place];  Service: Endoscopy;  Laterality: N/A;  . ESOPHAGOGASTRODUODENOSCOPY  12/21/2011   Procedure: ESOPHAGOGASTRODUODENOSCOPY (EGD);  Surgeon: Missy Sabins, MD;  Location[facility] ENDOSCOPY;  Service: Endoscopy;  Laterality: N/A;  . ESOPHAGOGASTRODUODENOSCOPY N/A 11/06/2012   Procedure: ESOPHAGOGASTRODUODENOSCOPY (EGD);  Surgeon: Missy Sabins, MD;  Location: [facility];  Service: Endoscopy;  Laterality: N/A;  . ESOPHAGOGASTRODUODENOSCOPY N/A 12/04/2013   Procedure: ESOPHAGOGASTRODUODENOSCOPY (EGD);  Surgeon: Missy Sabins, MD;  Location: [facility] ENDOSCOPY;  Service: Endoscopy;  Laterality: N/A;  . ESOPHAGOGASTRODUODENOSCOPY N/A  02/26/2014   Procedure: ESOPHAGOGASTRODUODENOSCOPY (EGD);  Surgeon: Missy Sabins, MD;  Location: [facility] ENDOSCOPY;  Service: Endoscopy;  Laterality: N/A;  . ESOPHAGOGASTRODUODENOSCOPY N/A 04/09/2017   Procedure: ESOPHAGOGASTRODUODENOSCOPY (EGD);  Surgeon: Clarene Essex, MD;  Location: [place];  Service: Endoscopy;  Laterality: N/A;  . ESOPHAGOGASTRODUODENOSCOPY (EGD) WITH PROPOFOL N/A 07/08/2015   Procedure: ESOPHAGOGASTRODUODENOSCOPY (EGD) WITH PROPOFOL;  Surgeon: Teena Irani, MD;  [facility];  Service: Endoscopy;  Laterality: N/A;  . ESOPHAGOGASTRODUODENOSCOPY (EGD) WITH PROPOFOL N/A 04/27/2016   Procedure: ESOPHAGOGASTRODUODENOSCOPY (EGD) WITH PROPOFOL;  Surgeon: Teena Irani, MD;  [facility];  Service: Endoscopy;  Laterality: N/A;  . EXCISION MASS UPPER EXTREMETIES Right 04/09/2018   Procedure: EXCISION RIGHT SHOULDER CYST;  Surgeon: Marchia Bond, MD;  [facility];  Service: Orthopedics;  Laterality: Right;  Interscalene Block  . FLEXIBLE SIGMOIDOSCOPY  05/01/2012   Procedure: FLEXIBLE SIGMOIDOSCOPY;  Surgeon: Jeryl Columbia, MD;  [facility];  Service: Endoscopy;  Laterality: N/A;  unprepped  . FLEXIBLE SIGMOIDOSCOPY Left 09/28/2016   Procedure:  FLEXIBLE SIGMOIDOSCOPY;  Surgeon: Wilford Corner, MD;  [facility];  Service: Endoscopy;  Laterality: Left;  . LAPAROSCOPIC CHOLECYSTECTOMY  01/2002  . NASAL SEPTOPLASTY W/ TURBINOPLASTY  1998  . SHOULDER ARTHROSCOPY WITH ROTATOR CUFF REPAIR Right 04/09/2018   Procedure: RIGHT SHOULDER ARTHROSCOPY WITH EXTENSIVE DEBRIDEMENT;  Surgeon: Marchia Bond, MD;  Location[facility];  Service: Orthopedics;  Laterality: Right;  Interscalene Block  . SHOULDER ARTHROSCOPY WITH ROTATOR CUFF REPAIR AND OPEN BICEPS TENODESIS Bilateral 2010-2013   left-right    Social History:  reports that she has never smoked. She has never used smokeless tobacco. She reports that she does not drink alcohol or use drugs.  Family  History:  Family History  Problem Relation Age of Onset  . Heart disease Mother   . COPD Mother   . Arthritis Mother   . Cancer Mother   . Hypertension Mother   . Arthritis Paternal Grandmother   . Heart disease Paternal Grandmother   . Heart disease Paternal Grandfather      Prior to Admission medications   Medication Sig Start Date End Date Taking? Authorizing Provider  albuterol (PROAIR HFA) 108 (90 Base) MCG/ACT inhaler Inhale 2 puffs every 4 (four) hours as needed into the lungs for wheezing or shortness of breath. 04/23/17  Yes Shawnee Knapp, MD  allopurinol (ZYLOPRIM) 300 MG tablet TAKE 1/2 TABLET BY MOUTH ONCE A DAY Patient taking differently: Take 150-300 mg by mouth daily.  12/31/17  Yes Shawnee Knapp, MD  ALPRAZolam Duanne Moron) 1 MG tablet TAKE ONE TABLET BY MOUTH THREE TIMES A DAY AS NEEDED FOR ANXIETY Patient taking differently: 1 mg 3 (three) times daily as needed for anxiety.  02/22/18  Yes Shawnee Knapp, MD  baclofen (LIORESAL) 10 MG tablet Take 10 mg by mouth 3 (three) times daily as needed for muscle spasms.    Yes [provider]  benazepril (LOTENSIN) 20 MG tablet Take 1 tablet (20 mg total) daily by mouth. 04/23/17  Yes Shawnee Knapp, MD  Calcium Carb-Cholecalciferol (CALCIUM 600 + D PO) Take 0.5 tablets by mouth daily.   Yes [provider]  cetirizine (ZYRTEC) 10 MG tablet Take 10 mg by mouth daily.    Yes [provider]  Cholecalciferol (VITAMIN D3) 5000 units CAPS Take 5,000 Units by mouth daily.    Yes [provider]  dexlansoprazole (DEXILANT) 60 MG capsule Take 60 mg by mouth 2 (two) times daily.   Yes [provider]  fluticasone (FLONASE) 50 MCG/ACT nasal spray PLACE 2 SPRAYS INTO BOTH NOSTRILS DAILY Patient taking differently: Place 2 sprays into both nostrils at bedtime.  11/28/17  Yes Shawnee Knapp, MD  HYDROcodone-acetaminophen [facility]) 10-325 MG per tablet Take 1-2 tablets by mouth every 6 (six) hours as needed for moderate  pain (pain.).    Yes [provider]  hydrOXYzine (ATARAX/VISTARIL) 10 MG tablet Take 1-3 tablets (10-30 mg total) by mouth every 6 (six) hours as needed for anxiety. Patient taking differently: Take 10-30 mg by mouth 3 (three) times daily as needed for anxiety.  02/22/18  Yes Shawnee Knapp, MD  ibuprofen (ADVIL,MOTRIN) 600 MG tablet Take 600 mg by mouth daily as needed (migraines).   Yes [provider]  Melatonin 5 MG TABS Take 5-10 mg by mouth at bedtime as needed (sleep).   Yes [provider]  metoprolol succinate (TOPROL-XL) 100 MG 24 hr tablet Take 1 tablet (100 mg total) by mouth daily. Take with or immediately following  a meal. 07/09/17  Yes Shawnee Knapp, MD  modafinil (PROVIGIL) 200 MG tablet Take 0.5 tablets (100 mg total) by mouth 2 (two) times daily. Patient taking differently: Take 100 mg by mouth See admin instructions. Take 100 mg every morning may take a second 100 mg dose as needed for narcolepsy 10/11/17  Yes Dohmeier, Asencion Partridge, MD  montelukast (SINGULAIR) 10 MG tablet TAKE ONE TABLET BY MOUTH EVERY NIGHT AT BEDTIME Patient taking differently: Take 10 mg by mouth at bedtime.  08/15/17  Yes Shawnee Knapp, MD  morphine (MS CONTIN) 30 MG 12 hr tablet Take 30 mg by mouth every 8 (eight) hours. Not from [place] 09/23/12  Yes [provider]  Multiple Vitamins-Minerals (MULTIVITAMIN PO) Take 1 tablet by mouth daily.   Yes [provider]  Olopatadine HCl (PAZEO) 0.7 % SOLN Apply 1 drop to eye daily. Patient taking differently: Apply 1 drop to eye daily as needed (allergies).  07/09/17  Yes Shawnee Knapp, MD  ondansetron (ZOFRAN) 4 MG tablet Take 1 tablet (4 mg total) by mouth every 8 (eight) hours as needed for nausea or vomiting. 11/18/16  Yes Tereasa Coop, PA-C  promethazine (PHENERGAN) 25 MG tablet Take 1 tablet (25 mg total) by mouth every 6 (six) hours as needed for nausea or vomiting. 11/18/16  Yes Tereasa Coop, PA-C  pseudoephedrine (SUDAFED) 120 MG 12  hr tablet Take 120 mg by mouth daily as needed for congestion.   Yes [provider]  spironolactone (ALDACTONE) 100 MG tablet Take 1 tablet (100 mg total) by mouth daily. 12/29/17  Yes Shawnee Knapp, MD  sucralfate (CARAFATE) 1 GM/10ML suspension Take 10 mLs (1 g total) by mouth 4 (four) times daily as needed (stomach pain). Patient taking differently: Take 1 g by mouth 2 (two) times daily as needed (stomach pain).  09/20/17  Yes Shawnee Knapp, MD  torsemide (DEMADEX) 10 MG tablet Take 1 tablet (10 mg total) by mouth daily. Patient taking differently: Take 10 mg by mouth daily as needed (edema).  02/22/18  Yes Shawnee Knapp, MD  fexofenadine (ALLEGRA) 180 MG tablet Take 180 mg by mouth daily. Switches between Allegra and Zyrtec as needed for allergies    [provider]  ipratropium (ATROVENT) 0.03 % nasal spray Place 2 sprays 4 (four) times daily into the nose. Patient not taking: Reported on 04/12/2018 04/23/17   Shawnee Knapp, MD  Pancrelipase, Lip-Prot-Amyl, (CREON) 24000 UNITS CPEP Take 72,000 Units by mouth 3 (three) times daily as needed (for pancreatitis).     [provider]    Physical Exam: Vitals:   04/11/18 2341 04/12/18 0200 04/12/18 0426 04/12/18 0455  BP: 127/73 120/74 (!) 162/77   Pulse: 66 67 79   Resp: (!) 26 15 (!) 22   Temp:      TempSrc:      SpO2: 96% 94% 97%   Weight:    133 kg   General: Not in acute distress HEENT:       Eyes: PERRL, EOMI, no scleral icterus.       ENT: No discharge from the ears and nose, no pharynx injection, no tonsillar enlargement.        Neck: Difficult to assess JVD due to morbid obesity, no bruit, no mass felt. Heme: No neck lymph node enlargement. Cardiac: S1/S2, RRR, No murmurs, No gallops or rubs. Respiratory:  No rales, wheezing, rhonchi or rubs. GI: Soft, nondistended, nontender, no rebound pain, no organomegaly, BS  present. GU: No hematuria Ext: has trace leg edema bilaterally. 2+DP/PT pulse  bilaterally. Musculoskeletal: S/p of surgery in right shoulder with mild tenderness. Skin: No rashes.  Neuro: Alert, oriented X3, cranial nerves II-XII grossly intact, moves all extremities normally. Psych: Patient is not psychotic, no suicidal or hemocidal ideation.  Labs on Admission: I have personally reviewed following labs and imaging studies  CBC: Recent Labs  Lab 04/08/18 0926 04/12/18 0005  WBC 7.2 10.6*  NEUTROABS  --  6.0  HGB 12.9 12.6  HCT 40.6 39.5  MCV 92.7 94.0  PLT 293 627   Basic Metabolic Panel: Recent Labs  Lab 04/08/18 0926 04/12/18 0005  NA 140 138  K 4.6 4.1  CL 105 106  CO2 26 26  GLUCOSE 166* 116*  BUN 19 22*  CREATININE 1.18* 1.45*  CALCIUM 9.7 9.3   GFR: Estimated Creatinine Clearance: 60.5 mL/min (A) (by C-G formula based on SCr of 1.45 mg/dL (H)). Liver Function Tests: No results for input(s): AST, ALT, ALKPHOS, BILITOT, PROT, ALBUMIN in the last 168 hours. No results for input(s): LIPASE, AMYLASE in the last 168 hours. No results for input(s): AMMONIA in the last 168 hours. Coagulation Profile: No results for input(s): INR, PROTIME in the last 168 hours. Cardiac Enzymes: No results for input(s): CKTOTAL, CKMB, CKMBINDEX, TROPONINI in the last 168 hours. BNP (last 3 results) No results for input(s): PROBNP in the last 8760 hours. HbA1C: No results for input(s): HGBA1C in the last 72 hours. CBG: No results for input(s): GLUCAP in the last 168 hours. Lipid Profile: No results for input(s): CHOL, HDL, LDLCALC, TRIG, CHOLHDL, LDLDIRECT in the last 72 hours. Thyroid Function Tests: No results for input(s): TSH, T4TOTAL, FREET4, T3FREE, THYROIDAB in the last 72 hours. Anemia Panel: No results for input(s): VITAMINB12, FOLATE, FERRITIN, TIBC, IRON, RETICCTPCT in the last 72 hours. Urine analysis:    Component Value Date/Time   COLORURINE YELLOW 11/18/2016 1801   APPEARANCEUR CLEAR 11/18/2016 1801   LABSPEC 1.015 11/18/2016 1801    PHURINE 5.0 11/18/2016 1801   GLUCOSEU NEGATIVE 11/18/2016 1801   HGBUR NEGATIVE 11/18/2016 1801   BILIRUBINUR negative 02/09/2018 0837   BILIRUBINUR negative 12/27/2014 0942   KETONESUR negative 02/09/2018 [place] 11/18/2016 1801   PROTEINUR negative 02/09/2018 0837   PROTEINUR NEGATIVE 11/18/2016 1801   UROBILINOGEN 0.2 02/09/2018 0837   UROBILINOGEN 0.2 02/03/2014 2022   NITRITE Negative 02/09/2018 0837   NITRITE NEGATIVE 11/18/2016 1801   LEUKOCYTESUR Negative 02/09/2018 0837   Sepsis Labs: @LABRCNTIP (procalcitonin:4,lacticidven:4) )No results found for this or any previous visit (from the past 240 hour(s)).   Radiological Exams on Admission: Dg Chest 2 View  Result Date: 04/11/2018 CLINICAL DATA:  Initial evaluation for acute shortness of breath. EXAM: CHEST - 2 VIEW COMPARISON:  Prior radiograph from 11/18/2016. FINDINGS: Cardiac and mediastinal silhouettes are stable in size and contour, and remain within normal limits. Lungs are hypoinflated. Scattered linear bibasilar opacities most consistent with atelectasis. No focal infiltrates. No pulmonary edema or pleural effusion. No pneumothorax. No acute osseous abnormality. IMPRESSION: 1. Shallow lung inflation with associated bibasilar atelectasis. 2. No other active cardiopulmonary disease. Electronically Signed   By: Jeannine Boga M.D.   On: 04/11/2018 22:23   Ct Angio Chest Pe W And/or Wo Contrast  Result Date: 04/12/2018 CLINICAL DATA:  57 year old female with shortness of breath. Recent surgery. Concern for pulmonary embolism. EXAM: CT ANGIOGRAPHY CHEST WITH CONTRAST TECHNIQUE: Multidetector CT imaging of the chest was performed using the  standard protocol during bolus administration of intravenous contrast. Multiplanar CT image reconstructions and MIPs were obtained to evaluate the vascular anatomy. CONTRAST:  177mL ISOVUE-370 IOPAMIDOL (ISOVUE-370) INJECTION 76% COMPARISON:  Chest radiograph dated  04/11/2018 and chest CT dated 02/03/2014 FINDINGS: Cardiovascular: There is borderline cardiomegaly. No pericardial effusion. The thoracic aorta is unremarkable. Evaluation of the pulmonary arteries is limited due to suboptimal visualization of the peripheral branches and respiratory motion artifact. No large or central pulmonary artery embolus identified. Mediastinum/Nodes: There is no hilar or mediastinal adenopathy. Esophagus and the thyroid gland are grossly unremarkable. No mediastinal fluid collection. Lungs/Pleura: There is eventration of the right hemidiaphragm. Right lung base linear and streaky densities most consistent with atelectasis/scarring. A subsegmental area of consolidation in the medial basal right lung base most likely atelectasis are low infiltrate is not excluded. Clinical correlation is recommended. There is no pleural effusion or pneumothorax. The central airways are patent. Upper Abdomen: Cholecystectomy. The visualized upper abdomen is otherwise unremarkable. Musculoskeletal: No chest wall abnormality. No acute or significant osseous findings. Review of the MIP images confirms the above findings. IMPRESSION: 1. No CT evidence of central pulmonary artery embolus. 2. Right lung base linear atelectasis/scarring. A subsegmental area of consolidation, likely atelectasis. Pneumonia is less likely. Clinical correlation is recommended. Electronically Signed   By: Anner Crete M.D.   On: 04/12/2018 02:07     EKG: Independently reviewed.  Sinus rhythm, QTC 435, low voltage, Q waves in lead III, nonspecific T wave change.   Assessment/Plan Principal Problem:   Acute respiratory failure with hypoxia (HCC) Active Problems:   Hypertension   Fibromyalgia   OSA on CPAP   CKD (chronic kidney disease) stage 3, GFR 30-59 ml/min (HCC)   GERD (gastroesophageal reflux disease)   Anxiety state   HTN (hypertension)   Acute on chronic diastolic (congestive) heart failure (HCC)   Acute  respiratory failure with hypoxia ([place]): Etiology is not completely clear. Differential diagnosis includes CHF exacerbation given 3 pounds weight gain and not taking torsemide in the past 7 days, asthma exacerbation (less likely, given no wheezing on auscultation), pneumonia (less likely, given no fever, chest pain, WBCs only 10.6) and ACS (less likely given no any chest pain). PE was ruled out by negative CT angiogram.  Another possibility is hypoventilation/OSA.  Patient is very concerned about her kidney function, does not want to start IV Lasix, also not want to restart torsemide. It is OK to continue spironolactone. She does not want to have fluid restriction. She states that she cannot tolerate aspirin due to acid reflux and gastroparesis.    -will place on tele bed for obs -continue home spironolactone (no Lasix, torsemide, fluid restriction, aspirin) -trop x 3 to r/o silent myocardial infarction -check BNP (may not very help since it may be falsely low due to obesity) -Daily weights -Low salt diet -Continue metoprolol -PRN albuterol nebulizer -Nasal cannula oxygen to maintain oxygen saturation above 93%  Possible acute on chronic diastolic (congestive) heart failure [facility]): Patient did not know that she has diastolic CHF, but that she is on as needed torsemide and spironolactone at home.  2D echo on 06/29/2017 showed EF 50% with grade 1 diastolic dysfunction.  Today patient has shortness of breath and oxygen desaturation, 3 pounds with skin, indicating possible mild CHF exacerbation. In addition, pt may have received IV fluid during the surgery. -See above  HTN:  -Continue home medications: Lotensin, spironolactone -IV hydralazine prn  Fibromyalgia: -Continue home MS Contin, Norco  OSA -  on CPAP  CKD (chronic kidney disease) stage 3, GFR 30-59 ml/min ([place]): Slightly worsening.  Baseline creatinine 1.1-1.3.  Her creatinine is 1.45, BUN 22. -Follow-up renal function by  BMP  GERD: -Protonix and Carafate  Anxiety state: -Continue home PRN Xanax and hydroxyzine  Asthma: stable.  Lungs clear to auscultation. - Continue Singulair -PRN albuterol nebulizers.  S/p of right shoulder rotator cuff surgery: -Continue home Norco, MS Contin -PRN morphine for severe pain    DVT ppx: SQ Lovenox Code Status: Full code Family Communication: None at bed side.   Disposition Plan:  Anticipate discharge back to previous home environment Consults called:  none Admission status: Obs / tele     Date of Service 04/12/2018    Ivor Costa [facility] Pager [phone]  If 7PM-7AM, please contact night-coverage [email or web address] Password [email or web address] 04/12/2018, 5:05 AM

## 2018-04-12 NOTE — Progress Notes (Signed)
Oxygen sat 97% on RA at rest.  Ambulated 251ft in the hall and oxygen sat remained 95-97%.  Pt without complaints of SOB but did say she felt fatigued.

## 2018-04-12 NOTE — Progress Notes (Signed)
Patient has home CPAP  

## 2018-04-12 NOTE — Progress Notes (Signed)
Noted 02 sats on ra-no ome 02 needed. No further CM needs.

## 2018-04-12 NOTE — ED Notes (Signed)
Ambulated pt, as soon as pt stood up her O2 sats dropped from 96% to 90%, as she ambulated from her room to bathroom her O2 was as low as 82 %. Pt has to stop and take a deep breath few times which helped bring her O2 up to low 90s. Pt reports feeling very winded.

## 2018-04-12 NOTE — ED Notes (Signed)
Bed: MV36 Expected date:  Expected time:  Means of arrival:  Comments: Hold 1-8

## 2018-04-12 NOTE — Care Management Note (Signed)
Case Management Note  Patient Details  Name: Valerie [name] MRN: [id number] Date of Birth: 03-Oct-1960  Subjective/Objective:  Admitted w/respiratory failure. Hx: CHF,CKD, has ortho sling. From home alone,Indep PTA. CM referral for heart failure screen-1 adm/past 6 months.On 02- Will continue to monitor.                  Action/Plan:d/c home.   Expected Discharge Date:                  Expected Discharge Plan:  Home/Self Care  In-House Referral:     Discharge planning Services  CM Consult  Post Acute Care Choice:    Choice offered to:     DME Arranged:    DME Agency:     HH Arranged:    HH Agency:     Status of Service:  In process, will continue to follow  If discussed at Long Length of Stay Meetings, dates discussed:    Additional Comments:  Dessa Phi, RN 04/12/2018, 9:27 AM

## 2018-04-12 NOTE — ED Notes (Signed)
ED TO INPATIENT HANDOFF REPORT  Name/Age/Gender Valerie [name] 57 y.o. female  Code Status    Code Status Orders  (From admission, onward)         Start     Ordered   04/12/18 0402  Full code  Continuous     04/12/18 0403        Code Status History    Date Active Date Inactive Code Status Order ID Comments User Context   09/24/2016 0328 09/29/2016 1809 Full Code [id number]  Norval Morton, MD ED   12/28/2015 1728 12/29/2015 2156 Full Code [id number]  Vivi Barrack, MD Inpatient   04/28/2012 2237 05/02/2012 1526 Full Code [id number]  Etta Quill., DO ED    Advance Directive Documentation     Most Recent Value  Type of Advance Directive  Healthcare Power of Attorney, Living will  Pre-existing out of facility DNR order (yellow form or pink MOST form)  -  "MOST" Form in Place?  -      Home/SNF/Other Home  Chief Complaint shortness of breath had surgery tuesday  Level of Care/Admitting Diagnosis ED Disposition    ED Disposition Condition [place]: [facility] [[id number]]  Level of Care: Telemetry [5]  Admit to tele based on following criteria: Other see comments  Comments: CHF  Diagnosis: Acute on chronic diastolic (congestive) heart failure [facility]) [2706237]  Admitting Physician: Ivor Costa [[id number]]  Attending Physician: Ivor Costa [[id number]]  PT Class (Do Not Modify): Observation [104]  PT Acc Code (Do Not Modify): Observation [10022]       Medical History Past Medical History:  Diagnosis Date  . Acute ischemic colitis ([place])   . Anemia 05/01/2012  . Anxiety   . ARF (acute renal failure) ([place]) 12/28/2015  . Arthritis    "basically ~ q joint" (12/28/2015)  . Asthma   . Basal cell carcinoma of upper back ~ 2011  . Chronic right-sided headaches    and neck pain, relieved wiht PT and chronic practic manipulation  . Complete tear of right rotator cuff, recurrent 04/09/2018  . DDD (degenerative disc disease), lumbar   .  Diarrhea    EPISODIC  . Fatigue    h/o excessive sedation, etiology unknown  . Fibromyalgia   . First degree heart block   . GERD (gastroesophageal reflux disease) 04/06/2017  . Headache(784.0)    "muscular w/migraine intensity; ~ 5-6 months" (12/28/2015)  . History of ischemic colitis 09/24/2016  . Hypertension   . Mental disorder    reports PTSD  . Microscopic polyangiitis ([place])   . Nerve pain 2011   nerve damage to right arm; normal EMG/NCV to RUExt after shoulder surgery 12/2009  . Other bursal cyst, right shoulder 04/09/2018  . Pancreatitis    due to Effexor  . Pneumonia ~ 2014; 08/2015   Aspiration  . Polymyalgia rheumatica ([place]) onset 08/2015   changed diagnosis to MPA  . Precancerous melanosis ([place])    "2nd toe left foot; 5th toe of right foot; right temple; had them all cut off"  . Sleep apnea    CPAP-9    Allergies Allergies  Allergen Reactions  . Dilaudid [Hydromorphone Hcl] Other (See Comments)    Headache   . Effexor [Venlafaxine]     pancreatitis  . Pregabalin Other (See Comments)    Drug induced stupor  . Adhesive [Tape]     Paper tape causes blood red marks  . Metoclopramide Hcl Other (See Comments)    "  crawling inside"  . Neurontin [Gabapentin] Other (See Comments)    Tremors   . Other Other (See Comments)    Avoid all SSRI's and SNRI's- damaged pancreas  . Serotonin Reuptake Inhibitors (Ssris) Other (See Comments)    Damage to pancreas and bowel/urinary incontinence     IV Location/Drains/Wounds Patient Lines/Drains/Airways Status   Active Line/Drains/Airways    Name:   Placement date:   Placement time:   Site:   Days:   Peripheral IV 04/12/18 Left Antecubital   04/12/18    0004    Antecubital   less than 1   Incision (Closed) 04/09/18 Shoulder Right   04/09/18    1543     3          Labs/Imaging Results for orders placed or performed during the hospital encounter of 04/11/18 (from the past 48 hour(s))  Basic metabolic panel     Status:  Abnormal   Collection Time: 04/12/18 12:05 AM  Result Value Ref Range   Sodium 138 135 - 145 mmol/L   Potassium 4.1 3.5 - 5.1 mmol/L   Chloride 106 98 - 111 mmol/L   CO2 26 22 - 32 mmol/L   Glucose, Bld 116 (H) 70 - 99 mg/dL   BUN 22 (H) 6 - 20 mg/dL   Creatinine, Ser 1.45 (H) 0.44 - 1.00 mg/dL   Calcium 9.3 8.9 - 10.3 mg/dL   GFR calc non Af Amer 40 (L) >60 mL/min   GFR calc Af Amer 46 (L) >60 mL/min    Comment: (NOTE) The eGFR has been calculated using the CKD EPI equation. This calculation has not been validated in all clinical situations. eGFR's persistently <60 mL/min signify possible Chronic Kidney Disease.    Anion gap 6 5 - 15    Comment: Performed at [facility], [place] [address], [place], [place] [address]  CBC with Differential     Status: Abnormal   Collection Time: 04/12/18 12:05 AM  Result Value Ref Range   WBC 10.6 (H) 4.0 - 10.5 K/uL   RBC 4.20 3.87 - 5.11 MIL/uL   Hemoglobin 12.6 12.0 - 15.0 g/dL   HCT 39.5 36.0 - 46.0 %   MCV 94.0 80.0 - 100.0 fL   MCH 30.0 26.0 - 34.0 pg   MCHC 31.9 30.0 - 36.0 g/dL   RDW 13.6 11.5 - 15.5 %   Platelets 282 150 - 400 K/uL   nRBC 0.0 0.0 - 0.2 %   Neutrophils Relative % 55 %   Neutro Abs 6.0 1.7 - 7.7 K/uL   Lymphocytes Relative 36 %   Lymphs Abs 3.8 0.7 - 4.0 K/uL   Monocytes Relative 5 %   Monocytes Absolute 0.5 0.1 - 1.0 K/uL   Eosinophils Relative 2 %   Eosinophils Absolute 0.2 0.0 - 0.5 K/uL   Basophils Relative 1 %   Basophils Absolute 0.1 0.0 - 0.1 K/uL   Immature Granulocytes 1 %   Abs Immature Granulocytes 0.05 0.00 - 0.07 K/uL    Comment: Performed at [facility], [place] [address] [place], [place] [address]  POCT i-Stat troponin I     Status: None   Collection Time: 04/12/18 12:14 AM  Result Value Ref Range   Troponin i, poc 0.00 0.00 - 0.08 ng/mL   Comment 3            Comment: Due to the release kinetics of cTnI, a negative result within the first hours of the  onset  of symptoms does not rule out myocardial infarction with certainty. If myocardial infarction is still suspected, repeat the test at appropriate intervals.    Dg Chest 2 View  Result Date: 04/11/2018 CLINICAL DATA:  Initial evaluation for acute shortness of breath. EXAM: CHEST - 2 VIEW COMPARISON:  Prior radiograph from 11/18/2016. FINDINGS: Cardiac and mediastinal silhouettes are stable in size and contour, and remain within normal limits. Lungs are hypoinflated. Scattered linear bibasilar opacities most consistent with atelectasis. No focal infiltrates. No pulmonary edema or pleural effusion. No pneumothorax. No acute osseous abnormality. IMPRESSION: 1. Shallow lung inflation with associated bibasilar atelectasis. 2. No other active cardiopulmonary disease. Electronically Signed   By: Jeannine Boga M.D.   On: 04/11/2018 22:23   Ct Angio Chest Pe W And/or Wo Contrast  Result Date: 04/12/2018 CLINICAL DATA:  57 year old female with shortness of breath. Recent surgery. Concern for pulmonary embolism. EXAM: CT ANGIOGRAPHY CHEST WITH CONTRAST TECHNIQUE: Multidetector CT imaging of the chest was performed using the standard protocol during bolus administration of intravenous contrast. Multiplanar CT image reconstructions and MIPs were obtained to evaluate the vascular anatomy. CONTRAST:  135m ISOVUE-370 IOPAMIDOL (ISOVUE-370) INJECTION 76% COMPARISON:  Chest radiograph dated 04/11/2018 and chest CT dated 02/03/2014 FINDINGS: Cardiovascular: There is borderline cardiomegaly. No pericardial effusion. The thoracic aorta is unremarkable. Evaluation of the pulmonary arteries is limited due to suboptimal visualization of the peripheral branches and respiratory motion artifact. No large or central pulmonary artery embolus identified. Mediastinum/Nodes: There is no hilar or mediastinal adenopathy. Esophagus and the thyroid gland are grossly unremarkable. No mediastinal fluid collection. Lungs/Pleura:  There is eventration of the right hemidiaphragm. Right lung base linear and streaky densities most consistent with atelectasis/scarring. A subsegmental area of consolidation in the medial basal right lung base most likely atelectasis are low infiltrate is not excluded. Clinical correlation is recommended. There is no pleural effusion or pneumothorax. The central airways are patent. Upper Abdomen: Cholecystectomy. The visualized upper abdomen is otherwise unremarkable. Musculoskeletal: No chest wall abnormality. No acute or significant osseous findings. Review of the MIP images confirms the above findings. IMPRESSION: 1. No CT evidence of central pulmonary artery embolus. 2. Right lung base linear atelectasis/scarring. A subsegmental area of consolidation, likely atelectasis. Pneumonia is less likely. Clinical correlation is recommended. Electronically Signed   By: AAnner CreteM.D.   On: 04/12/2018 02:07   EKG Interpretation  Date/Time:  Thursday April 11 2018 21:11:24 EDT Ventricular Rate:  80 PR Interval:    QRS Duration: 90 QT Interval:  377 QTC Calculation: 435 R Axis:   45 Text Interpretation:  Sinus rhythm Baseline wander in lead(s) II III aVF Since last tracing rate slower Confirmed by HIsla [phone] on 04/11/2018 10:26:39 PM   Pending Labs Unresulted Labs (From admission, onward)    Start     Ordered   04/13/18 07902 Basic metabolic panel  Daily,   R     04/12/18 0403   04/12/18 0404  Brain natriuretic peptide  Once,   R     04/12/18 0403   04/12/18 0403  Troponin I  Now then every 6 hours,   R     04/12/18 0403   04/12/18 0401  HIV antibody (Routine Testing)  Once,   R     04/12/18 0403          Vitals/Pain Today's Vitals   04/12/18 0034 04/12/18 0100 04/12/18 0200 04/12/18 0426  BP:   120/74 (!) 162/77  Pulse:   67 79  Resp:   15 (!) 22  Temp:      TempSrc:      SpO2:   94% 97%  PainSc: 5  3       Isolation Precautions No active  isolations  Medications Medications  sodium chloride 0.9 % injection (has no administration in time range)  allopurinol (ZYLOPRIM) tablet 150-300 mg (has no administration in time range)  HYDROcodone-acetaminophen (NORCO) 10-325 MG per tablet 1-2 tablet (has no administration in time range)  morphine (MS CONTIN) 12 hr tablet 30 mg (has no administration in time range)  benazepril (LOTENSIN) tablet 20 mg (has no administration in time range)  metoprolol succinate (TOPROL-XL) 24 hr tablet 100 mg (has no administration in time range)  ALPRAZolam (XANAX) tablet 1 mg (has no administration in time range)  hydrOXYzine (ATARAX/VISTARIL) tablet 10-30 mg (has no administration in time range)  modafinil (PROVIGIL) tablet 100 mg (has no administration in time range)  pantoprazole (PROTONIX) EC tablet 40 mg (has no administration in time range)  ondansetron (ZOFRAN) tablet 4 mg (has no administration in time range)  lipase/protease/amylase (CREON) capsule 12,000 Units (has no administration in time range)  sucralfate (CARAFATE) 1 GM/10ML suspension 1 g (has no administration in time range)  Melatonin TABS 5-10 mg (has no administration in time range)  baclofen (LIORESAL) tablet 10 mg (has no administration in time range)  Calcium Carbonate-Vitamin D 600-400 MG-UNIT 1 tablet (has no administration in time range)  Vitamin D3 CAPS 5,000 Units (has no administration in time range)  MULTIVITAMIN LIQD (has no administration in time range)  loratadine (CLARITIN) tablet 10 mg (has no administration in time range)  fluticasone (FLONASE) 50 MCG/ACT nasal spray 2 spray (has no administration in time range)  montelukast (SINGULAIR) tablet 10 mg (has no administration in time range)  Olopatadine HCl 0.7 % SOLN 1 drop (has no administration in time range)  sodium chloride flush (NS) 0.9 % injection 3 mL (has no administration in time range)  sodium chloride flush (NS) 0.9 % injection 3 mL (has no administration in  time range)  0.9 %  sodium chloride infusion (has no administration in time range)  acetaminophen (TYLENOL) tablet 650 mg (has no administration in time range)  enoxaparin (LOVENOX) injection 40 mg (has no administration in time range)  aspirin EC tablet 81 mg (has no administration in time range)  hydrALAZINE (APRESOLINE) injection 5 mg (has no administration in time range)  morphine 4 MG/ML injection 4 mg (4 mg Intravenous Given 04/12/18 0031)  iopamidol (ISOVUE-370) 76 % injection 100 mL (100 mLs Intravenous Contrast Given 04/12/18 0101)  morphine 4 MG/ML injection 4 mg (4 mg Intravenous Given 04/12/18 0411)    Mobility walks

## 2018-04-12 NOTE — Progress Notes (Signed)
Spoke with patient.  Readmitted for SOB.  CT negative.  Currently feels ok when resting, SOB with exertion.    Appreciate medical management.    RTC with me as already scheduled, in about 2 weeks, encourage Incentive Spirometer.  Stay in sling until follow up.  Ok to come out of sling for hygiene and bathing, but keep wounds dry.    Johnny Bridge, MD

## 2018-04-13 ENCOUNTER — Ambulatory Visit: Payer: Medicare Other | Admitting: Family Medicine

## 2018-04-13 DIAGNOSIS — F411 Generalized anxiety disorder: Secondary | ICD-10-CM | POA: Diagnosis not present

## 2018-04-13 DIAGNOSIS — I10 Essential (primary) hypertension: Secondary | ICD-10-CM

## 2018-04-13 DIAGNOSIS — I5032 Chronic diastolic (congestive) heart failure: Secondary | ICD-10-CM | POA: Diagnosis not present

## 2018-04-13 DIAGNOSIS — G4733 Obstructive sleep apnea (adult) (pediatric): Secondary | ICD-10-CM | POA: Diagnosis not present

## 2018-04-13 DIAGNOSIS — K219 Gastro-esophageal reflux disease without esophagitis: Secondary | ICD-10-CM

## 2018-04-13 DIAGNOSIS — Z9989 Dependence on other enabling machines and devices: Secondary | ICD-10-CM

## 2018-04-13 DIAGNOSIS — J9601 Acute respiratory failure with hypoxia: Secondary | ICD-10-CM | POA: Diagnosis not present

## 2018-04-13 DIAGNOSIS — M797 Fibromyalgia: Secondary | ICD-10-CM | POA: Diagnosis not present

## 2018-04-13 DIAGNOSIS — N183 Chronic kidney disease, stage 3 (moderate): Secondary | ICD-10-CM | POA: Diagnosis not present

## 2018-04-13 LAB — BASIC METABOLIC PANEL
ANION GAP: 10 (ref 5–15)
BUN: 24 mg/dL — ABNORMAL HIGH (ref 6–20)
CHLORIDE: 104 mmol/L (ref 98–111)
CO2: 23 mmol/L (ref 22–32)
Calcium: 9.3 mg/dL (ref 8.9–10.3)
Creatinine, Ser: 1.15 mg/dL — ABNORMAL HIGH (ref 0.44–1.00)
GFR calc non Af Amer: 53 mL/min — ABNORMAL LOW (ref >60)
Glucose, Bld: 120 mg/dL — ABNORMAL HIGH (ref 70–99)
Potassium: 4.2 mmol/L (ref 3.5–5.1)
SODIUM: 137 mmol/L (ref 135–145)

## 2018-04-13 MED ORDER — TORSEMIDE 10 MG PO TABS: 10.0000 mg | ORAL_TABLET | Freq: Every day | ORAL | Status: DC | PRN

## 2018-04-13 NOTE — Progress Notes (Signed)
Went over discharge papers with patient.  All questions answered.  AVS given to patient.  Pt wheeled out by NT.

## 2018-04-13 NOTE — Progress Notes (Signed)
Wasted 100mg  of modafinil with jess asaro, Therapist, sports.

## 2018-04-13 NOTE — Discharge Summary (Signed)
Physician Discharge Summary  Valerie [name] QVZ:[id number] DOB: [birth year] DOA: 04/11/2018  PCP: Shawnee Knapp, MD  Admit date: 04/11/2018 Discharge date: 04/13/2018  Time spent: 60 minutes  Recommendations for Outpatient Follow-up:  1. Follow-up with Dr. Mardelle Matte, orthopedics as scheduled. 2. Follow-up with Shawnee Knapp, MD as scheduled.   Discharge Diagnoses:  Principal Problem:   Acute respiratory failure with hypoxia (HCC) Active Problems:   Hypertension   Fibromyalgia   OSA on CPAP   CKD (chronic kidney disease) stage 3, GFR 30-59 ml/min (HCC)   GERD (gastroesophageal reflux disease)   Anxiety state   HTN (hypertension)   Acute on chronic diastolic (congestive) heart failure ([place])   Discharge Condition: Stable and improved  Diet recommendation: Heart healthy  Filed Weights   04/12/18 0455 04/12/18 0523 04/13/18 0548  Weight: 133 kg 133.6 kg 132.6 kg    History of present illness:  Per Dr.Niu  Valerie [name] is a 58 y.o. female with medical history significant of dCHF, hypertension, asthma, GERD, gout, anxiety, ischemic colitis, fibromyalgia, pancreatitis, PMR, obesity, OSA on CPAP, CKD-3,who presented with shortness of breath  Patient stated that she underwent right shoulder rotator cuff surgery 2 days prior to admission.  She tolerated surgery well.  The surgical site had been healing well.  Patient stated that she started having shortness of breath when she woke up from surgery, which had been progressively getting worse.  She has mild dry cough, but no chest pain.  Denied fever or chills.  Using home inhaler has some help.  Patient did not have nausea, vomiting, diarrhea, abdominal pain, symptoms of UTI or unilateral weakness.  Patient stated that she did not take her torsemide in the past 7 days. She still takes her spironolactone, last dose was 1 day prior to admission.  She stated that her BW was 290 LBs before the surgery, and now is 293 LBs in ED. Per ED  physician, patient had oxygen desaturation to 82% on exertion, currently 96% when resting.   ED Course: pt was found to have WBC 10.6, negative troponin, renal function slightly worsening than baseline, no fever, no tachycardia, negative chest x-ray.  CT angiogram is negative for PE.  Chest x-ray negative.  Patient is placed on telemetry bed of observation.  Hospital Course:  1 acute respiratory failure with hypoxia Patient presented with acute respiratory failure with hypoxia.  Likely secondary to atelectasis.  Initial concern was for CHF exacerbation given a 3 pound weight gain and not taking her torsemide 7 days prior to admission.  BNP which was obtained was within normal limits.  Patient had no wheezing on examination.  CT angiogram of the chest which was done was negative for PE or pneumonia however it was consistent with atelectasis.  On admission patient was noted to desat to 82% on exertion.  Patient was admitted, given incentive spirometry, resumed back on her home regimen of spironolactone and her other cardiac medications and monitored.  Cardiac enzymes were cycled which were negative x3.  Patient maintained on home regimen of metoprolol.  Patient was placed on nebulizers as needed.  Patient use incentive spirometry during the hospitalization and improved clinically.  Patient was noted to have sats of 97% on room air at rest and 95 to 97% with ambulation.  Patient improved clinically and acute respiratory failure with hypoxia had resolved by day of discharge.  Patient was discharged home in stable and improved condition is to follow-up with PCP in the outpatient setting.  2.  Chronic diastolic CHF Remained stable during the hospitalization.  Patient noted to be on torsemide as needed as well as spironolactone.  2D echo from June 29, 2017 had a EF of 50% with grade 1 diastolic dysfunction.  Patient had presented with shortness of breath with desaturations and a 3 pound weight gain.  Initial  concern was for mild CHF exacerbation.  BNP was within normal limits.  Patient was maintained on home regimen of spironolactone.  Patient remained euvolemic and is stable condition throughout the hospitalization.  Outpatient follow-up.  3.  Hypertension Patient maintained on home regimen of Lotensin and spironolactone.  4.  Fibromyalgia Patient was maintained on her home regimen of pain medications.  5.  Obstructive sleep apnea Patient maintained on CPAP nightly.  6.  Chronic kidney disease stage III Remained stable.  7.  Gastroesophageal reflux disease Patient maintained on a PPI and Carafate.  8.  Anxiety Remained stable.  Patient maintained on home regimen of Xanax as needed as well as hydroxyzine.  9.  Asthma Remained stable.  Patient maintained on singular as well as nebs as needed.  10.  Status post right shoulder rotator cuff surgery Patient maintained on home regimen of pain medication.  Patient stated she spoke with her Ortho pod via telephone and was recommended to remain in sling until follow-up.  Outpatient follow-up with orthopedics as previously scheduled.  Procedures:  CT angiogram chest 04/12/2018  Chest x-ray 04/11/2018  Consultations:  None  Discharge Exam: Vitals:   04/12/18 2018 04/13/18 0548  BP: 127/73 108/76  Pulse: 64 65  Resp: 20 18  Temp: 98.7 F (37.1 C) 98.5 F (36.9 C)  SpO2: 93% 95%    General: NAD Cardiovascular: Regular rate and rhythm no murmurs rubs or gallops.  Distant heart sounds secondary to body habitus. Respiratory: Clear to auscultation bilaterally.  No wheezes, no crackles, no rhonchi.  Discharge Instructions   Discharge Instructions    Diet - low sodium heart healthy   Complete by:  As directed    Increase activity slowly   Complete by:  As directed      Allergies as of 04/13/2018      Reactions   Dilaudid [hydromorphone Hcl] Other (See Comments)   Headache    Effexor [venlafaxine]    pancreatitis    Pregabalin Other (See Comments)   Drug induced stupor   Adhesive [tape]    Paper tape causes blood red marks   Metoclopramide Hcl Other (See Comments)   "crawling inside"   Neurontin [gabapentin] Other (See Comments)   Tremors    Other Other (See Comments)   Avoid all SSRI's and SNRI's- damaged pancreas   Serotonin Reuptake Inhibitors (ssris) Other (See Comments)   Damage to pancreas and bowel/urinary incontinence       Medication List    TAKE these medications   albuterol 108 (90 Base) MCG/ACT inhaler Commonly known as:  PROVENTIL HFA;VENTOLIN HFA Inhale 2 puffs every 4 (four) hours as needed into the lungs for wheezing or shortness of breath.   allopurinol 300 MG tablet Commonly known as:  ZYLOPRIM TAKE 1/2 TABLET BY MOUTH ONCE A DAY What changed:  See the new instructions.   ALPRAZolam 1 MG tablet Commonly known as:  XANAX TAKE ONE TABLET BY MOUTH THREE TIMES A DAY AS NEEDED FOR ANXIETY What changed:    how to take this  reasons to take this  additional instructions   baclofen 10 MG tablet Commonly known as:  LIORESAL Take  10 mg by mouth 3 (three) times daily as needed for muscle spasms.   benazepril 20 MG tablet Commonly known as:  LOTENSIN Take 1 tablet (20 mg total) daily by mouth.   CALCIUM 600 + D PO Take 0.5 tablets by mouth daily.   cetirizine 10 MG tablet Commonly known as:  ZYRTEC Take 10 mg by mouth daily.   CREON 24000-76000 units Cpep Generic drug:  Pancrelipase (Lip-Prot-Amyl) Take 72,000 Units by mouth 3 (three) times daily as needed (for pancreatitis).   dexlansoprazole 60 MG capsule Commonly known as:  DEXILANT Take 60 mg by mouth 2 (two) times daily.   fexofenadine 180 MG tablet Commonly known as:  ALLEGRA Take 180 mg by mouth daily. Switches between Allegra and Zyrtec as needed for allergies   fluticasone 50 MCG/ACT nasal spray Commonly known as:  FLONASE PLACE 2 SPRAYS INTO BOTH NOSTRILS DAILY What changed:  when to take this    HYDROcodone-acetaminophen 10-325 MG tablet Commonly known as:  NORCO Take 1-2 tablets by mouth every 6 (six) hours as needed for moderate pain (pain.).   hydrOXYzine 10 MG tablet Commonly known as:  ATARAX/VISTARIL Take 1-3 tablets (10-30 mg total) by mouth every 6 (six) hours as needed for anxiety. What changed:  when to take this   ibuprofen 600 MG tablet Commonly known as:  ADVIL,MOTRIN Take 600 mg by mouth daily as needed (migraines).   ipratropium 0.03 % nasal spray Commonly known as:  ATROVENT Place 2 sprays 4 (four) times daily into the nose.   Melatonin 5 MG Tabs Take 5-10 mg by mouth at bedtime as needed (sleep).   metoprolol succinate 100 MG 24 hr tablet Commonly known as:  TOPROL-XL Take 1 tablet (100 mg total) by mouth daily. Take with or immediately following a meal.   modafinil 200 MG tablet Commonly known as:  PROVIGIL Take 0.5 tablets (100 mg total) by mouth 2 (two) times daily. What changed:    when to take this  additional instructions   montelukast 10 MG tablet Commonly known as:  SINGULAIR TAKE ONE TABLET BY MOUTH EVERY NIGHT AT BEDTIME What changed:    how much to take  how to take this  when to take this  additional instructions   morphine 30 MG 12 hr tablet Commonly known as:  MS CONTIN Take 30 mg by mouth every 8 (eight) hours. Not from [facility]   MULTIVITAMIN PO Take 1 tablet by mouth daily.   Olopatadine HCl 0.7 % Soln Apply 1 drop to eye daily. What changed:    when to take this  reasons to take this   ondansetron 4 MG tablet Commonly known as:  ZOFRAN Take 1 tablet (4 mg total) by mouth every 8 (eight) hours as needed for nausea or vomiting.   promethazine 25 MG tablet Commonly known as:  PHENERGAN Take 1 tablet (25 mg total) by mouth every 6 (six) hours as needed for nausea or vomiting.   pseudoephedrine 120 MG 12 hr tablet Commonly known as:  SUDAFED Take 120 mg by mouth daily as needed for congestion.    spironolactone 100 MG tablet Commonly known as:  ALDACTONE Take 1 tablet (100 mg total) by mouth daily.   sucralfate 1 GM/10ML suspension Commonly known as:  CARAFATE Take 10 mLs (1 g total) by mouth 4 (four) times daily as needed (stomach pain). What changed:  when to take this   torsemide 10 MG tablet Commonly known as:  DEMADEX Take 1 tablet (10 mg  total) by mouth daily as needed (edema).   Vitamin D3 5000 units Caps Take 5,000 Units by mouth daily.      Allergies  Allergen Reactions  . Dilaudid [Hydromorphone Hcl] Other (See Comments)    Headache   . Effexor [Venlafaxine]     pancreatitis  . Pregabalin Other (See Comments)    Drug induced stupor  . Adhesive [Tape]     Paper tape causes blood red marks  . Metoclopramide Hcl Other (See Comments)    "crawling inside"  . Neurontin [Gabapentin] Other (See Comments)    Tremors   . Other Other (See Comments)    Avoid all SSRI's and SNRI's- damaged pancreas  . Serotonin Reuptake Inhibitors (Ssris) Other (See Comments)    Damage to pancreas and bowel/urinary incontinence    Follow-up Information    Shawnee Knapp, MD Follow up.   Specialty:  Family Medicine Why:  f/u as scheduled. Contact information: [place] [address] [phone]        Marchia Bond, MD Follow up.   Specialty:  Orthopedic Surgery Why:  f/u as scheduled Contact information: [place] [address] [phone]            The results of significant diagnostics from this hospitalization (including imaging, microbiology, ancillary and laboratory) are listed below for reference.    Significant Diagnostic Studies: Dg Chest 2 View  Result Date: 04/11/2018 CLINICAL DATA:  Initial evaluation for acute shortness of breath. EXAM: CHEST - 2 VIEW COMPARISON:  Prior radiograph from 11/18/2016. FINDINGS: Cardiac and mediastinal silhouettes are stable in size and contour, and remain within normal  limits. Lungs are hypoinflated. Scattered linear bibasilar opacities most consistent with atelectasis. No focal infiltrates. No pulmonary edema or pleural effusion. No pneumothorax. No acute osseous abnormality. IMPRESSION: 1. Shallow lung inflation with associated bibasilar atelectasis. 2. No other active cardiopulmonary disease. Electronically Signed   By: Jeannine Boga M.D.   On: 04/11/2018 22:23   Ct Angio Chest Pe W And/or Wo Contrast  Result Date: 04/12/2018 CLINICAL DATA:  57 year old female with shortness of breath. Recent surgery. Concern for pulmonary embolism. EXAM: CT ANGIOGRAPHY CHEST WITH CONTRAST TECHNIQUE: Multidetector CT imaging of the chest was performed using the standard protocol during bolus administration of intravenous contrast. Multiplanar CT image reconstructions and MIPs were obtained to evaluate the vascular anatomy. CONTRAST:  152mL ISOVUE-370 IOPAMIDOL (ISOVUE-370) INJECTION 76% COMPARISON:  Chest radiograph dated 04/11/2018 and chest CT dated 02/03/2014 FINDINGS: Cardiovascular: There is borderline cardiomegaly. No pericardial effusion. The thoracic aorta is unremarkable. Evaluation of the pulmonary arteries is limited due to suboptimal visualization of the peripheral branches and respiratory motion artifact. No large or central pulmonary artery embolus identified. Mediastinum/Nodes: There is no hilar or mediastinal adenopathy. Esophagus and the thyroid gland are grossly unremarkable. No mediastinal fluid collection. Lungs/Pleura: There is eventration of the right hemidiaphragm. Right lung base linear and streaky densities most consistent with atelectasis/scarring. A subsegmental area of consolidation in the medial basal right lung base most likely atelectasis are low infiltrate is not excluded. Clinical correlation is recommended. There is no pleural effusion or pneumothorax. The central airways are patent. Upper Abdomen: Cholecystectomy. The visualized upper abdomen is  otherwise unremarkable. Musculoskeletal: No chest wall abnormality. No acute or significant osseous findings. Review of the MIP images confirms the above findings. IMPRESSION: 1. No CT evidence of central pulmonary artery embolus. 2. Right lung base linear atelectasis/scarring. A subsegmental area of consolidation, likely atelectasis. Pneumonia is  less likely. Clinical correlation is recommended. Electronically Signed   By: Anner Crete M.D.   On: 04/12/2018 02:07   Mr Shoulder Right W Contrast  Result Date: 03/21/2018 CLINICAL DATA:  Worsening right shoulder pain over the past month. Prior rotator cuff and biceps tendon repair. Large acromioclavicular joint cyst on CT. EXAM: MR ARTHROGRAM OF THE RIGHT SHOULDER TECHNIQUE: Multiplanar, multisequence MR imaging of the right shoulder was performed following the administration of intra-articular contrast. CONTRAST:  See Injection Documentation. COMPARISON:  Neck CT dated March 11, 2018. FINDINGS: Rotator cuff: Prior supraspinatus tendon repair with small focal recurrent full-thickness, partial width tear of the far anterior distal tendon fibers at the footprint. The infraspinatus, teres minor, and subscapularis tendons are intact. Muscles:  No focal muscular atrophy or edema. Biceps long head:  Prior biceps tenotomy/tenodesis. Acromioclavicular Joint: The acromion is type II. Widening of the acromioclavicular joint with contrast filling the joint space. 2.5 cm mildly loculated T2 hyperintense cystic lesion arising from the acromioclavicular joint and extending into the trapezius muscle. Small amount of contrast within the subacromial/subdeltoid bursa. Glenohumeral Joint: Distended with intra-articular contrast. Mild diffuse chondral thinning and superficial irregularity without focal defect. Labrum:  No evidence of labral tear. Bones: No acute or significant extra-articular osseous findings. Other: No significant soft tissue findings. IMPRESSION: 1. Prior  supraspinatus tendon repair with small focal recurrent full-thickness tear of the far anterior distal tendon fibers at the footprint. 2. Intra-articular contrast extension into a widened AC joint with unchanged 2.5 cm acromioclavicular joint cyst extending into the trapezius muscle. 3. Mild glenohumeral degenerative changes. Electronically Signed   By: Titus Dubin M.D.   On: 03/21/2018 09:57   Dg Fluoro Guided Needle Plc Aspiration/injection Loc  Result Date: 03/20/2018 CLINICAL DATA:  Chronic right shoulder pain. Prior rotator cuff repair. AC joint cyst. FLUOROSCOPY TIME:  Radiation Exposure Index (as provided by the fluoroscopic device): 1.2 mGy Fluoroscopy Time:  4 seconds Number of Acquired Images:  0 PROCEDURE: The risks and benefits of the procedure were discussed with the patient, and written informed consent was obtained. The patient stated no history of allergy to contrast media. A formal timeout procedure was performed with the patient according to departmental protocol. The patient was placed supine on the fluoroscopy table and the right glenohumeral joint was identified under fluoroscopy. The skin overlying the right glenohumeral joint was subsequently cleaned with Betadine and a sterile drape was placed over the area of interest. 2 ml 1% Lidocaine was used to anesthetize the skin around the needle insertion site. A 22 gauge spinal needle was inserted into the right glenohumeral joint under fluoroscopy. 12 ml of gadolinium mixture (0.1 ml of Multihance mixed with 10 ml of Isovue-M 200 contrast and 10 ml of sterile saline) was injected into the right glenohumeral joint. The needle was removed and hemostasis was achieved. The patient was subsequently transferred to MRI for imaging. IMPRESSION: Technically successful right shoulder injection for MRI. Electronically Signed   By: Titus Dubin M.D.   On: 03/20/2018 15:58    Microbiology: No results found for this or any previous visit (from the  past 240 hour(s)).   Labs: Basic Metabolic Panel: Recent Labs  Lab 04/08/18 0926 04/12/18 0005 04/13/18 0521  NA 140 138 137  K 4.6 4.1 4.2  CL 105 106 104  CO2 26 26 23   GLUCOSE 166* 116* 120*  BUN 19 22* 24*  CREATININE 1.18* 1.45* 1.15*  CALCIUM 9.7 9.3 9.3   Liver Function Tests: No  results for input(s): AST, ALT, ALKPHOS, BILITOT, PROT, ALBUMIN in the last 168 hours. No results for input(s): LIPASE, AMYLASE in the last 168 hours. No results for input(s): AMMONIA in the last 168 hours. CBC: Recent Labs  Lab 04/08/18 0926 04/12/18 0005  WBC 7.2 10.6*  NEUTROABS  --  6.0  HGB 12.9 12.6  HCT 40.6 39.5  MCV 92.7 94.0  PLT 293 282   Cardiac Enzymes: Recent Labs  Lab 04/12/18 0549  TROPONINI <0.03   BNP: BNP (last 3 results) Recent Labs    04/12/18 0549  BNP 14.4    ProBNP (last 3 results) No results for input(s): PROBNP in the last 8760 hours.  CBG: No results for input(s): GLUCAP in the last 168 hours.     Signed:  Irine Seal MD.  [facility] 04/13/2018, 9:39 AM

## 2018-04-14 ENCOUNTER — Encounter: Payer: Self-pay | Admitting: Family Medicine

## 2018-04-17 DIAGNOSIS — F4310 Post-traumatic stress disorder, unspecified: Secondary | ICD-10-CM | POA: Diagnosis not present

## 2018-04-17 DIAGNOSIS — M79661 Pain in right lower leg: Secondary | ICD-10-CM | POA: Diagnosis not present

## 2018-04-17 DIAGNOSIS — E669 Obesity, unspecified: Secondary | ICD-10-CM | POA: Diagnosis not present

## 2018-04-17 DIAGNOSIS — M79662 Pain in left lower leg: Secondary | ICD-10-CM | POA: Diagnosis not present

## 2018-04-17 DIAGNOSIS — K30 Functional dyspepsia: Secondary | ICD-10-CM | POA: Diagnosis not present

## 2018-04-17 DIAGNOSIS — K219 Gastro-esophageal reflux disease without esophagitis: Secondary | ICD-10-CM | POA: Diagnosis not present

## 2018-04-17 DIAGNOSIS — M533 Sacrococcygeal disorders, not elsewhere classified: Secondary | ICD-10-CM | POA: Diagnosis not present

## 2018-04-17 DIAGNOSIS — Z79891 Long term (current) use of opiate analgesic: Secondary | ICD-10-CM | POA: Diagnosis not present

## 2018-04-17 DIAGNOSIS — M300 Polyarteritis nodosa: Secondary | ICD-10-CM | POA: Diagnosis not present

## 2018-04-17 DIAGNOSIS — Z9181 History of falling: Secondary | ICD-10-CM | POA: Diagnosis not present

## 2018-04-17 DIAGNOSIS — M25511 Pain in right shoulder: Secondary | ICD-10-CM | POA: Diagnosis not present

## 2018-04-17 DIAGNOSIS — Z9889 Other specified postprocedural states: Secondary | ICD-10-CM | POA: Diagnosis not present

## 2018-04-17 DIAGNOSIS — G8929 Other chronic pain: Secondary | ICD-10-CM | POA: Diagnosis not present

## 2018-04-17 DIAGNOSIS — K559 Vascular disorder of intestine, unspecified: Secondary | ICD-10-CM | POA: Diagnosis not present

## 2018-04-17 DIAGNOSIS — F329 Major depressive disorder, single episode, unspecified: Secondary | ICD-10-CM | POA: Diagnosis not present

## 2018-04-17 DIAGNOSIS — G4733 Obstructive sleep apnea (adult) (pediatric): Secondary | ICD-10-CM | POA: Diagnosis not present

## 2018-04-17 DIAGNOSIS — Z9989 Dependence on other enabling machines and devices: Secondary | ICD-10-CM | POA: Diagnosis not present

## 2018-04-17 DIAGNOSIS — Z8701 Personal history of pneumonia (recurrent): Secondary | ICD-10-CM | POA: Diagnosis not present

## 2018-04-17 DIAGNOSIS — M545 Low back pain: Secondary | ICD-10-CM | POA: Diagnosis not present

## 2018-04-17 DIAGNOSIS — Z8739 Personal history of other diseases of the musculoskeletal system and connective tissue: Secondary | ICD-10-CM | POA: Diagnosis not present

## 2018-04-17 DIAGNOSIS — F332 Major depressive disorder, recurrent severe without psychotic features: Secondary | ICD-10-CM | POA: Diagnosis not present

## 2018-04-22 DIAGNOSIS — M25511 Pain in right shoulder: Secondary | ICD-10-CM | POA: Diagnosis not present

## 2018-04-22 DIAGNOSIS — M24111 Other articular cartilage disorders, right shoulder: Secondary | ICD-10-CM | POA: Diagnosis not present

## 2018-04-24 ENCOUNTER — Ambulatory Visit (INDEPENDENT_AMBULATORY_CARE_PROVIDER_SITE_OTHER): Payer: Medicare Other | Admitting: Family Medicine

## 2018-04-24 ENCOUNTER — Other Ambulatory Visit: Payer: Self-pay

## 2018-04-24 ENCOUNTER — Encounter: Payer: Self-pay | Admitting: Family Medicine

## 2018-04-24 VITALS — BP 144/91 | HR 83 | Temp 98.0°F | Resp 16 | Ht 65.0 in | Wt 298.0 lb

## 2018-04-24 DIAGNOSIS — R7303 Prediabetes: Secondary | ICD-10-CM

## 2018-04-24 DIAGNOSIS — M797 Fibromyalgia: Secondary | ICD-10-CM

## 2018-04-24 DIAGNOSIS — M71311 Other bursal cyst, right shoulder: Secondary | ICD-10-CM | POA: Diagnosis not present

## 2018-04-24 DIAGNOSIS — M2560 Stiffness of unspecified joint, not elsewhere classified: Secondary | ICD-10-CM

## 2018-04-24 DIAGNOSIS — I10 Essential (primary) hypertension: Secondary | ICD-10-CM

## 2018-04-24 DIAGNOSIS — F332 Major depressive disorder, recurrent severe without psychotic features: Secondary | ICD-10-CM

## 2018-04-24 DIAGNOSIS — N183 Chronic kidney disease, stage 3 unspecified: Secondary | ICD-10-CM

## 2018-04-24 DIAGNOSIS — F1190 Opioid use, unspecified, uncomplicated: Secondary | ICD-10-CM

## 2018-04-24 DIAGNOSIS — R601 Generalized edema: Secondary | ICD-10-CM

## 2018-04-24 LAB — POCT URINALYSIS DIP (MANUAL ENTRY)
BILIRUBIN UA: NEGATIVE
BILIRUBIN UA: NEGATIVE mg/dL
Blood, UA: NEGATIVE
GLUCOSE UA: NEGATIVE mg/dL
Nitrite, UA: POSITIVE — AB
Protein Ur, POC: NEGATIVE mg/dL
Spec Grav, UA: 1.02 (ref 1.010–1.025)
Urobilinogen, UA: 0.2 E.U./dL
pH, UA: 6 (ref 5.0–8.0)

## 2018-04-24 MED ORDER — ALBUTEROL SULFATE HFA 108 (90 BASE) MCG/ACT IN AERS: 2.0000 | INHALATION_SPRAY | RESPIRATORY_TRACT | 11 refills | Status: AC | PRN

## 2018-04-24 NOTE — Progress Notes (Signed)
Subjective:    Patient: Valerie [name] "[name]"  DOB: [birth year]; 57 y.o.   MRN: [id number]  Chief Complaint  Patient presents with  . Hospitalization Follow-up    pt had Surgery on her shoulder on 10/29 and would like to follow-up    HPI She had a very hard time in the hospital with initially being labeled with heart failure after being readmitted on 10/31.  As soon as she woke up from shoulder surgery on 10/29 at [facility]  she was wet, had lost her bowels and was having trouble breathing.  She is telling all the nurses but they sent her home anyway and never told her surgeon or her anesthegioloist about her sxs at all. They didn't send her home from incentive spirometer. After going to the bathroom it took her 45 min to recatch her breath at home.  So then she had to go back to the hosp on 10/31 into the ED - she was struggling and felt starving for air for 4.5 hrs.  she had to work hard to keep O2 sat in the 90s and her O2 sat dropped to 84% upon standing.  So then she was in the hosp x 3d - w/u for CHF, PE but ended up being atelectasis. So now she has an ER bill and another hosp stay that should not have happened.  Her breathing is back to normal and has a little wheezing over the past few days but she is using her inhaler ~1x/d, no PND.  Breathing/DOE/SHOB back to normal now. Has been using the incentive spirometer sev d but once she was able to get it all the way to the top, she stopped.   She held the torsemide for a long time, no fluids in the hosp. She just restrated and took one a couple of days ago and feels her edema is at her baseline.  Needs albuterol daily and req refill.  [name] called her Sat when she got out and then [name] and told her she would keep her on top of the process and be put on top her about but has not heard from anyone now for 10d    Saw [name] on Mon - 2d ago and gave her good report - but keep it dry and not shower. Then f/u 4 wks from [name]. Is going  to have [name] start working on shoulder - has orders from [place] and will see [name] tomorrow to set up future PT appts for shoulder.   SI joint problems Jan 1996 when she fell on ice and when flairs on up L3 nerve root - pain clinic, disc bulge L2-4 Looked size of walnut and  Back killing her  PTSD so the new senior pastor was going to meet with her once a month which isn't adeuqate so has been using the xanax 1 qd.  Has talked with the staff-parish committee as well. Saw therapist last Wed which was very helpful to her - has been talking once a week by phone. Is holding her xanax to her once a day.  Has never done EMDR - she did try it for a while and she did have a good relationship with the therapist who was doing it but never seen to help with her.  Charlett Blake Using hydroxyzing 2-3 tabs every night.  In the late afternoon-evening her mind start going bonkers so that's when she takes  Medical History Past Medical History:  Diagnosis Date  . Acute ischemic colitis ([place])   . Anemia 05/01/2012  . Anxiety   . ARF (acute renal failure) ([place]) 12/28/2015  . Arthritis    "basically ~ q joint" (12/28/2015)  . Asthma   . Basal cell carcinoma of upper back ~ 2011  . Chronic right-sided headaches    and neck pain, relieved wiht PT and chronic practic manipulation  . Complete tear of right rotator cuff, recurrent 04/09/2018  . DDD (degenerative disc disease), lumbar   . Diarrhea    EPISODIC  . Fatigue    h/o excessive sedation, etiology unknown  . Fibromyalgia   . First degree heart block   . GERD (gastroesophageal reflux disease) 04/06/2017  . Headache(784.0)    "muscular w/migraine intensity; ~ 5-6 months" (12/28/2015)  . History of ischemic colitis 09/24/2016  . Hypertension   . Mental disorder    reports PTSD  . Microscopic polyangiitis ([place])   . Nerve pain 2011   nerve damage to right arm; normal EMG/NCV to RUExt after shoulder  surgery 12/2009  . Other bursal cyst, right shoulder 04/09/2018  . Pancreatitis    due to Effexor  . Pneumonia ~ 2014; 08/2015   Aspiration  . Polymyalgia rheumatica ([place]) onset 08/2015   changed diagnosis to MPA  . Precancerous melanosis ([place])    "2nd toe left foot; 5th toe of right foot; right temple; had them all cut off"  . Sleep apnea    CPAP-9   Past Surgical History:  Procedure Laterality Date  . BASAL CELL CARCINOMA EXCISION  ~ 2011   back  . BOTOX INJECTION N/A 11/06/2012   Procedure: BOTOX INJECTION;  Surgeon: Missy Sabins, MD;  Location: [facility];  Service: Endoscopy;  Laterality: N/A;  . BOTOX INJECTION N/A 12/04/2013   Procedure: BOTOX INJECTION;  Surgeon: Missy Sabins, MD;  Location: [place];  Service: Endoscopy;  Laterality: N/A;  . BOTOX INJECTION N/A 02/26/2014   Procedure: BOTOX INJECTION;  Surgeon: Missy Sabins, MD;  Location: [place];  Service: Endoscopy;  Laterality: N/A;  . BOTOX INJECTION N/A 07/08/2015   Procedure: BOTOX INJECTION;  Surgeon: Teena Irani, MD;  [facility];  Service: Endoscopy;  Laterality: N/A;  . BOTOX INJECTION N/A 04/27/2016   Procedure: BOTOX INJECTION;  Surgeon: Teena Irani, MD;  [facility];  Service: Endoscopy;  Laterality: N/A;  . BOTOX INJECTION N/A 04/09/2017   Procedure: BOTOX INJECTION;  Surgeon: Clarene Essex, MD;  Location: [place];  Service: Endoscopy;  Laterality: N/A;  . ESOPHAGOGASTRODUODENOSCOPY  12/21/2011   Procedure: ESOPHAGOGASTRODUODENOSCOPY (EGD);  Surgeon: Missy Sabins, MD;  Location: [facility]COPY;  Service: Endoscopy;  Laterality: N/A;  . ESOPHAGOGASTRODUODENOSCOPY N/A 11/06/2012   Procedure: ESOPHAGOGASTRODUODENOSCOPY (EGD);  Surgeon: Missy Sabins, MD;  Location: [facility];  Service: Endoscopy;  Laterality: N/A;  . ESOPHAGOGASTRODUODENOSCOPY N/A 12/04/2013   Procedure: ESOPHAGOGASTRODUODENOSCOPY (EGD);  Surgeon: Missy Sabins, MD;  Location[facility];  Service: Endoscopy;   Laterality: N/A;  . ESOPHAGOGASTRODUODENOSCOPY N/A 02/26/2014   Procedure: ESOPHAGOGASTRODUODENOSCOPY (EGD);  Surgeon: Missy Sabins, MD;  Location: [facility]COPY;  Service: Endoscopy;  Laterality: N/A;  . ESOPHAGOGASTRODUODENOSCOPY N/A 04/09/2017   Procedure: ESOPHAGOGASTRODUODENOSCOPY (EGD);  Surgeon: Clarene Essex, MD;  Location: [place];  Service: Endoscopy;  Laterality: N/A;  . ESOPHAGOGASTRODUODENOSCOPY (EGD) WITH PROPOFOL N/A 07/08/2015   Procedure: ESOPHAGOGASTRODUODENOSCOPY (EGD) WITH PROPOFOL;  Surgeon: Teena Irani, MD;  [facility];  Service: Endoscopy;  Laterality: N/A;  .  ESOPHAGOGASTRODUODENOSCOPY (EGD) WITH PROPOFOL N/A 04/27/2016   Procedure: ESOPHAGOGASTRODUODENOSCOPY (EGD) WITH PROPOFOL;  Surgeon: Teena Irani, MD;  [facility];  Service: Endoscopy;  Laterality: N/A;  . EXCISION MASS UPPER EXTREMETIES Right 04/09/2018   Procedure: EXCISION RIGHT SHOULDER CYST;  Surgeon: Marchia Bond, MD;  [facility];  Service: Orthopedics;  Laterality: Right;  Interscalene Block  . FLEXIBLE SIGMOIDOSCOPY  05/01/2012   Procedure: FLEXIBLE SIGMOIDOSCOPY;  Surgeon: Jeryl Columbia, MD;  [facility];  Service: Endoscopy;  Laterality: N/A;  unprepped  . FLEXIBLE SIGMOIDOSCOPY Left 09/28/2016   Procedure: FLEXIBLE SIGMOIDOSCOPY;  Surgeon: Wilford Corner, MD;  [facility];  Service: Endoscopy;  Laterality: Left;  . LAPAROSCOPIC CHOLECYSTECTOMY  01/2002  . NASAL SEPTOPLASTY W/ TURBINOPLASTY  1998  . SHOULDER ARTHROSCOPY WITH ROTATOR CUFF REPAIR Right 04/09/2018   Procedure: RIGHT SHOULDER ARTHROSCOPY WITH EXTENSIVE DEBRIDEMENT;  Surgeon: Marchia Bond, MD;  [facility];  Service: Orthopedics;  Laterality: Right;  Interscalene Block  . SHOULDER ARTHROSCOPY WITH ROTATOR CUFF REPAIR AND OPEN BICEPS TENODESIS Bilateral 2010-2013   left-right   Current Outpatient Medications on File Prior to Visit  Medication Sig Dispense Refill  . albuterol (PROAIR HFA)  108 (90 Base) MCG/ACT inhaler Inhale 2 puffs every 4 (four) hours as needed into the lungs for wheezing or shortness of breath. 8.5 each 11  . allopurinol (ZYLOPRIM) 300 MG tablet TAKE 1/2 TABLET BY MOUTH ONCE A DAY (Patient taking differently: Take 150-300 mg by mouth daily. ) 45 tablet 0  . ALPRAZolam (XANAX) 1 MG tablet TAKE ONE TABLET BY MOUTH THREE TIMES A DAY AS NEEDED FOR ANXIETY (Patient taking differently: 1 mg 3 (three) times daily as needed for anxiety. ) 90 tablet 1  . baclofen (LIORESAL) 10 MG tablet Take 10 mg by mouth 3 (three) times daily as needed for muscle spasms.     . benazepril (LOTENSIN) 20 MG tablet Take 1 tablet (20 mg total) daily by mouth. 90 tablet 3  . Calcium Carb-Cholecalciferol (CALCIUM 600 + D PO) Take 0.5 tablets by mouth daily.    . cetirizine (ZYRTEC) 10 MG tablet Take 10 mg by mouth daily.     . Cholecalciferol (VITAMIN D3) 5000 units CAPS Take 5,000 Units by mouth daily.     Marland Kitchen dexlansoprazole (DEXILANT) 60 MG capsule Take 60 mg by mouth 2 (two) times daily.    . fexofenadine (ALLEGRA) 180 MG tablet Take 180 mg by mouth daily. Switches between Allegra and Zyrtec as needed for allergies    . fluticasone (FLONASE) 50 MCG/ACT nasal spray PLACE 2 SPRAYS INTO BOTH NOSTRILS DAILY (Patient taking differently: Place 2 sprays into both nostrils at bedtime. ) 48 g 1  . HYDROcodone-acetaminophen (NORCO) 10-325 MG per tablet Take 1-2 tablets by mouth every 6 (six) hours as needed for moderate pain (pain.).     Marland Kitchen hydrOXYzine (ATARAX/VISTARIL) 10 MG tablet Take 1-3 tablets (10-30 mg total) by mouth every 6 (six) hours as needed for anxiety. (Patient taking differently: Take 10-30 mg by mouth 3 (three) times daily as needed for anxiety. ) 180 tablet 1  . ibuprofen (ADVIL,MOTRIN) 600 MG tablet Take 600 mg by mouth daily as needed (migraines).    Marland Kitchen ipratropium (ATROVENT) 0.03 % nasal spray Place 2 sprays 4 (four) times daily into the nose. 30 mL 1  . Melatonin 5 MG TABS Take 5-10  mg by mouth at bedtime as needed (sleep).    . metoprolol succinate (TOPROL-XL) 100 MG 24 hr tablet Take 1  tablet (100 mg total) by mouth daily. Take with or immediately following a meal. 90 tablet 3  . modafinil (PROVIGIL) 200 MG tablet Take 0.5 tablets (100 mg total) by mouth 2 (two) times daily. (Patient taking differently: Take 100 mg by mouth See admin instructions. Take 100 mg every morning may take a second 100 mg dose as needed for narcolepsy) 90 tablet 3  . montelukast (SINGULAIR) 10 MG tablet TAKE ONE TABLET BY MOUTH EVERY NIGHT AT BEDTIME (Patient taking differently: Take 10 mg by mouth at bedtime. ) 90 tablet 2  . morphine (MS CONTIN) 30 MG 12 hr tablet Take 30 mg by mouth every 8 (eight) hours. Not from [place]    . Multiple Vitamins-Minerals (MULTIVITAMIN PO) Take 1 tablet by mouth daily.    . Olopatadine HCl (PAZEO) 0.7 % SOLN Apply 1 drop to eye daily. (Patient taking differently: Apply 1 drop to eye daily as needed (allergies). ) 2.5 mL 11  . ondansetron (ZOFRAN) 4 MG tablet Take 1 tablet (4 mg total) by mouth every 8 (eight) hours as needed for nausea or vomiting. 20 tablet 0  . Pancrelipase, Lip-Prot-Amyl, (CREON) 24000 UNITS CPEP Take 72,000 Units by mouth 3 (three) times daily as needed (for pancreatitis).     . promethazine (PHENERGAN) 25 MG tablet Take 1 tablet (25 mg total) by mouth every 6 (six) hours as needed for nausea or vomiting. 30 tablet 1  . pseudoephedrine (SUDAFED) 120 MG 12 hr tablet Take 120 mg by mouth daily as needed for congestion.    Marland Kitchen spironolactone (ALDACTONE) 100 MG tablet Take 1 tablet (100 mg total) by mouth daily. 90 tablet 1  . sucralfate (CARAFATE) 1 GM/10ML suspension Take 10 mLs (1 g total) by mouth 4 (four) times daily as needed (stomach pain). (Patient taking differently: Take 1 g by mouth 2 (two) times daily as needed (stomach pain). ) 420 mL 1  . torsemide (DEMADEX) 10 MG tablet Take 1 tablet (10 mg total) by mouth daily as needed (edema).     No  current facility-administered medications on file prior to visit.    Allergies  Allergen Reactions  . Dilaudid [Hydromorphone Hcl] Other (See Comments)    Headache   . Effexor [Venlafaxine]     pancreatitis  . Pregabalin Other (See Comments)    Drug induced stupor  . Adhesive [Tape]     Paper tape causes blood red marks  . Metoclopramide Hcl Other (See Comments)    "crawling inside"  . Neurontin [Gabapentin] Other (See Comments)    Tremors   . Other Other (See Comments)    Avoid all SSRI's and SNRI's- damaged pancreas  . Serotonin Reuptake Inhibitors (Ssris) Other (See Comments)    Damage to pancreas and bowel/urinary incontinence    Family History  Problem Relation Age of Onset  . Heart disease Mother   . COPD Mother   . Arthritis Mother   . Cancer Mother   . Hypertension Mother   . Arthritis Paternal Grandmother   . Heart disease Paternal Grandmother   . Heart disease Paternal Grandfather    Social History   Socioeconomic History  . Marital status: Single    Spouse name: Not on file  . Number of children: 0  . Years of education: College  . Highest education level: Not on file  Occupational History  . Occupation: disabled  Social Needs  . Financial resource strain: Not on file  . Food insecurity:    Worry: Not on file  Inability: Not on file  . Transportation needs:    Medical: Not on file    Non-medical: Not on file  Tobacco Use  . Smoking status: Never Smoker  . Smokeless tobacco: Never Used  Substance and Sexual Activity  . Alcohol use: No  . Drug use: No  . Sexual activity: Not Currently  Lifestyle  . Physical activity:    Days per week: Not on file    Minutes per session: Not on file  . Stress: Not on file  Relationships  . Social connections:    Talks on phone: Not on file    Gets together: Not on file    Attends religious service: Not on file    Active member of club or organization: Not on file    Attends meetings of clubs or  organizations: Not on file    Relationship status: Not on file  Other Topics Concern  . Not on file  Social History Narrative   Caffeine 2 cups iced tea daily avg.   Depression screen [facility] 2/9 04/24/2018 03/12/2018 02/28/2018 02/09/2018 12/29/2017  Decreased Interest 0 0 0 0 0  Down, Depressed, Hopeless 0 0 0 0 0  PHQ - 2 Score 0 0 0 0 0  Altered sleeping - - - - -  Tired, decreased energy - - - - -  Change in appetite - - - - -  Feeling bad or failure about yourself  - - - - -  Trouble concentrating - - - - -  Moving slowly or fidgety/restless - - - - -  Suicidal thoughts - - - - -  PHQ-9 Score - - - - -  Difficult doing work/chores - - - - -  Some recent data might be hidden    ROS As noted in HPI  Objective:  BP (!) 144/91   Pulse 83   Temp 98 F (36.7 C) (Oral)   Resp 16   Ht 5\' 5"  (1.651 m)   Wt 298 lb (135.2 kg)   SpO2 98%   BMI 49.59 kg/m  Physical Exam  Constitutional: She is oriented to person, place, and time. She appears well-developed and well-nourished. No distress.  HENT:  Head: Normocephalic and atraumatic.  Right Ear: External ear normal.  Left Ear: External ear normal.  Eyes: Conjunctivae are normal. No scleral icterus.  Neck: Normal range of motion. Neck supple. No thyromegaly present.  Cardiovascular: Normal rate, regular rhythm, normal heart sounds and intact distal pulses.  Pulmonary/Chest: Effort normal and breath sounds normal. No respiratory distress.  Musculoskeletal: She exhibits no edema.  Lymphadenopathy:    She has no cervical adenopathy.  Neurological: She is alert and oriented to person, place, and time.  Skin: Skin is warm and dry. She is not diaphoretic. No erythema.  Psychiatric: She has a normal mood and affect. Her behavior is normal.    [place] [facility] Office Visit on 04/24/2018  Component Date Value Ref Range Status  . Color, UA 04/24/2018 yellow  yellow Final  . Clarity, UA 04/24/2018 clear  clear Final  . Glucose, UA  04/24/2018 negative  negative mg/dL Final  . Bilirubin, UA 04/24/2018 negative  negative Final  . Ketones, POC UA 04/24/2018 negative  negative mg/dL Final  . Spec Grav, UA 04/24/2018 1.020  1.010 - 1.025 Final  . Blood, UA 04/24/2018 negative  negative Final  . pH, UA 04/24/2018 6.0  5.0 - 8.0 Final  . Protein Ur, POC 04/24/2018 negative  negative mg/dL Final  .  Urobilinogen, UA 04/24/2018 0.2  0.2 or 1.0 E.U./dL Final  . Nitrite, UA 04/24/2018 Positive* Negative Final  . Leukocytes, UA 04/24/2018 Trace* Negative Final     Assessment & Plan:   1. Essential hypertension   2. CKD (chronic kidney disease) stage 3, GFR 30-59 ml/min (HCC)   3. Prediabetes   4. Other bursal cyst, right shoulder, AC joint into trapezius   5. Multiple stiff joints   6. Obesity, morbid ([place])   7. Severe episode of recurrent major depressive disorder, without psychotic features ([place])   8. Fibromyalgia   9. Chronic, continuous use of opioids   10. Generalized edema     Patient will continue on current chronic medications other than changes noted above, so ok to refill when needed.   See after visit summary for patient specific instructions.  Orders Placed This Encounter  Procedures  . Basic metabolic panel  . Hemoglobin A1c  . POCT urinalysis dipstick    Meds ordered this encounter  Medications  . albuterol (PROAIR HFA) 108 (90 Base) MCG/ACT inhaler    Sig: Inhale 2 puffs into the lungs every 4 (four) hours as needed for wheezing or shortness of breath.    Dispense:  8.5 each    Refill:  11    Patient verbalized to me that they understand the following: diagnosis, what is being done for them, what to expect and what should be done at home.  Their questions have been answered. They understand that I am unable to predict every possible medication interaction or adverse outcome and that if any unexpected symptoms arise, they should contact us and their pharmacist, as well as never hesitate to seek  urgent/emergent care at [facility] or ER if they think it might be warranted.    Over 40 min spent in face-to-face evaluation of and consultation with patient and coordination of care.  Over 50% of this time was spent counseling this patient regarding possibility of diastolic heart failure, sxs, w/u, trx.  Worsening mood sxs.  Delman Cheadle, MD, MPH [facility] at [facility] [address] [place], [place]  [address] [phone] Office phone  [phone] Office fax  04/24/18 9:46 AM

## 2018-04-24 NOTE — Patient Instructions (Signed)
° ° ° °  If you have lab work done today you will be contacted with your lab results within the next 2 weeks.  If you have not heard from us then please contact us. The fastest way to get your results is to register for My Chart. ° ° °IF you received an x-ray today, you will receive an invoice from [facility]. Please contact [facility] at [phone] with questions or concerns regarding your invoice.  ° °IF you received labwork today, you will receive an invoice from [facility]. Please contact [facility] at [phone] with questions or concerns regarding your invoice.  ° °Our billing staff will not be able to assist you with questions regarding bills from these companies. ° °You will be contacted with the lab results as soon as they are available. The fastest way to get your results is to activate your My Chart account. Instructions are located on the last page of this paperwork. If you have not heard from us regarding the results in 2 weeks, please contact this office. °  ° ° ° °

## 2018-04-25 LAB — HEMOGLOBIN A1C
Est. average glucose Bld gHb Est-mCnc: 140 mg/dL
HEMOGLOBIN A1C: 6.5 % — AB (ref 4.8–5.6)

## 2018-04-25 LAB — BASIC METABOLIC PANEL
BUN / CREAT RATIO: 17 (ref 9–23)
BUN: 18 mg/dL (ref 6–24)
CO2: 23 mmol/L (ref 20–29)
CREATININE: 1.09 mg/dL — AB (ref 0.57–1.00)
Calcium: 10.3 mg/dL — ABNORMAL HIGH (ref 8.7–10.2)
Chloride: 98 mmol/L (ref 96–106)
GFR calc Af Amer: 66 mL/min/{1.73_m2} (ref >59)
GFR, EST NON AFRICAN AMERICAN: 57 mL/min/{1.73_m2} — AB (ref >59)
Glucose: 140 mg/dL — ABNORMAL HIGH (ref 65–99)
Potassium: 4.6 mmol/L (ref 3.5–5.2)
Sodium: 138 mmol/L (ref 134–144)

## 2018-04-27 ENCOUNTER — Other Ambulatory Visit: Payer: Self-pay | Admitting: Family Medicine

## 2018-04-27 DIAGNOSIS — E790 Hyperuricemia without signs of inflammatory arthritis and tophaceous disease: Secondary | ICD-10-CM

## 2018-04-29 DIAGNOSIS — M542 Cervicalgia: Secondary | ICD-10-CM | POA: Diagnosis not present

## 2018-04-29 DIAGNOSIS — M25511 Pain in right shoulder: Secondary | ICD-10-CM | POA: Diagnosis not present

## 2018-04-30 ENCOUNTER — Encounter: Payer: Self-pay | Admitting: Family Medicine

## 2018-05-05 ENCOUNTER — Other Ambulatory Visit: Payer: Self-pay | Admitting: Family Medicine

## 2018-05-05 DIAGNOSIS — R1013 Epigastric pain: Secondary | ICD-10-CM

## 2018-05-06 NOTE — Telephone Encounter (Signed)
Requested Prescriptions  Pending Prescriptions Disp Refills  . CARAFATE 1 GM/10ML suspension [Pharmacy Med Name: CARAFATE 1 GM/10 ML SUSP] 420 mL 1    Sig: TAKE 10 ML'S BY MOUTH FOUR TIMES A DAY AS NEEDED FOR STOMACH PAIN     Gastroenterology: Antiacids Passed - 05/05/2018  7:08 AM      Passed - Valid encounter within last 12 months    Recent Outpatient Visits          1 week ago Essential hypertension   [facility], Laurey Arrow, MD   1 month ago Chronic right shoulder pain   [facility], Lilia Argue, MD   2 months ago Subcutaneous mass of supraclavicular area   [facility], Lilia Argue, MD   2 months ago Essential hypertension   [facility], Laurey Arrow, MD   4 months ago CKD (chronic kidney disease) stage 3, GFR 30-59 ml/min [facility])   [facility], Laurey Arrow, MD      Future Appointments            In 4 weeks Shawnee Knapp, MD [facility] [place], [facility]

## 2018-05-08 ENCOUNTER — Encounter: Payer: Self-pay | Admitting: Family Medicine

## 2018-05-08 ENCOUNTER — Ambulatory Visit (INDEPENDENT_AMBULATORY_CARE_PROVIDER_SITE_OTHER): Payer: Medicare Other | Admitting: Family Medicine

## 2018-05-08 ENCOUNTER — Other Ambulatory Visit: Payer: Self-pay

## 2018-05-08 VITALS — BP 138/87 | HR 79 | Temp 98.2°F | Resp 18 | Ht 65.0 in | Wt 301.6 lb

## 2018-05-08 DIAGNOSIS — R300 Dysuria: Secondary | ICD-10-CM

## 2018-05-08 DIAGNOSIS — R350 Frequency of micturition: Secondary | ICD-10-CM | POA: Diagnosis not present

## 2018-05-08 LAB — POCT URINALYSIS DIP (MANUAL ENTRY)
BILIRUBIN UA: NEGATIVE
Glucose, UA: NEGATIVE mg/dL
Ketones, POC UA: NEGATIVE mg/dL
NITRITE UA: NEGATIVE
PH UA: 6.5 (ref 5.0–8.0)
Protein Ur, POC: NEGATIVE mg/dL
Spec Grav, UA: 1.01 (ref 1.010–1.025)
UROBILINOGEN UA: 0.2 U/dL

## 2018-05-08 IMAGING — CR DG CHEST 2V
2 series · 2 of 2 positions shown · non-contrast
Comparison: 02/03/2014 and chest CTA dated 02/03/2014.

CLINICAL DATA: Right upper quadrant abdominal pain.  Leukocytosis.

EXAM:
CHEST  2 VIEW

[w chest pa]
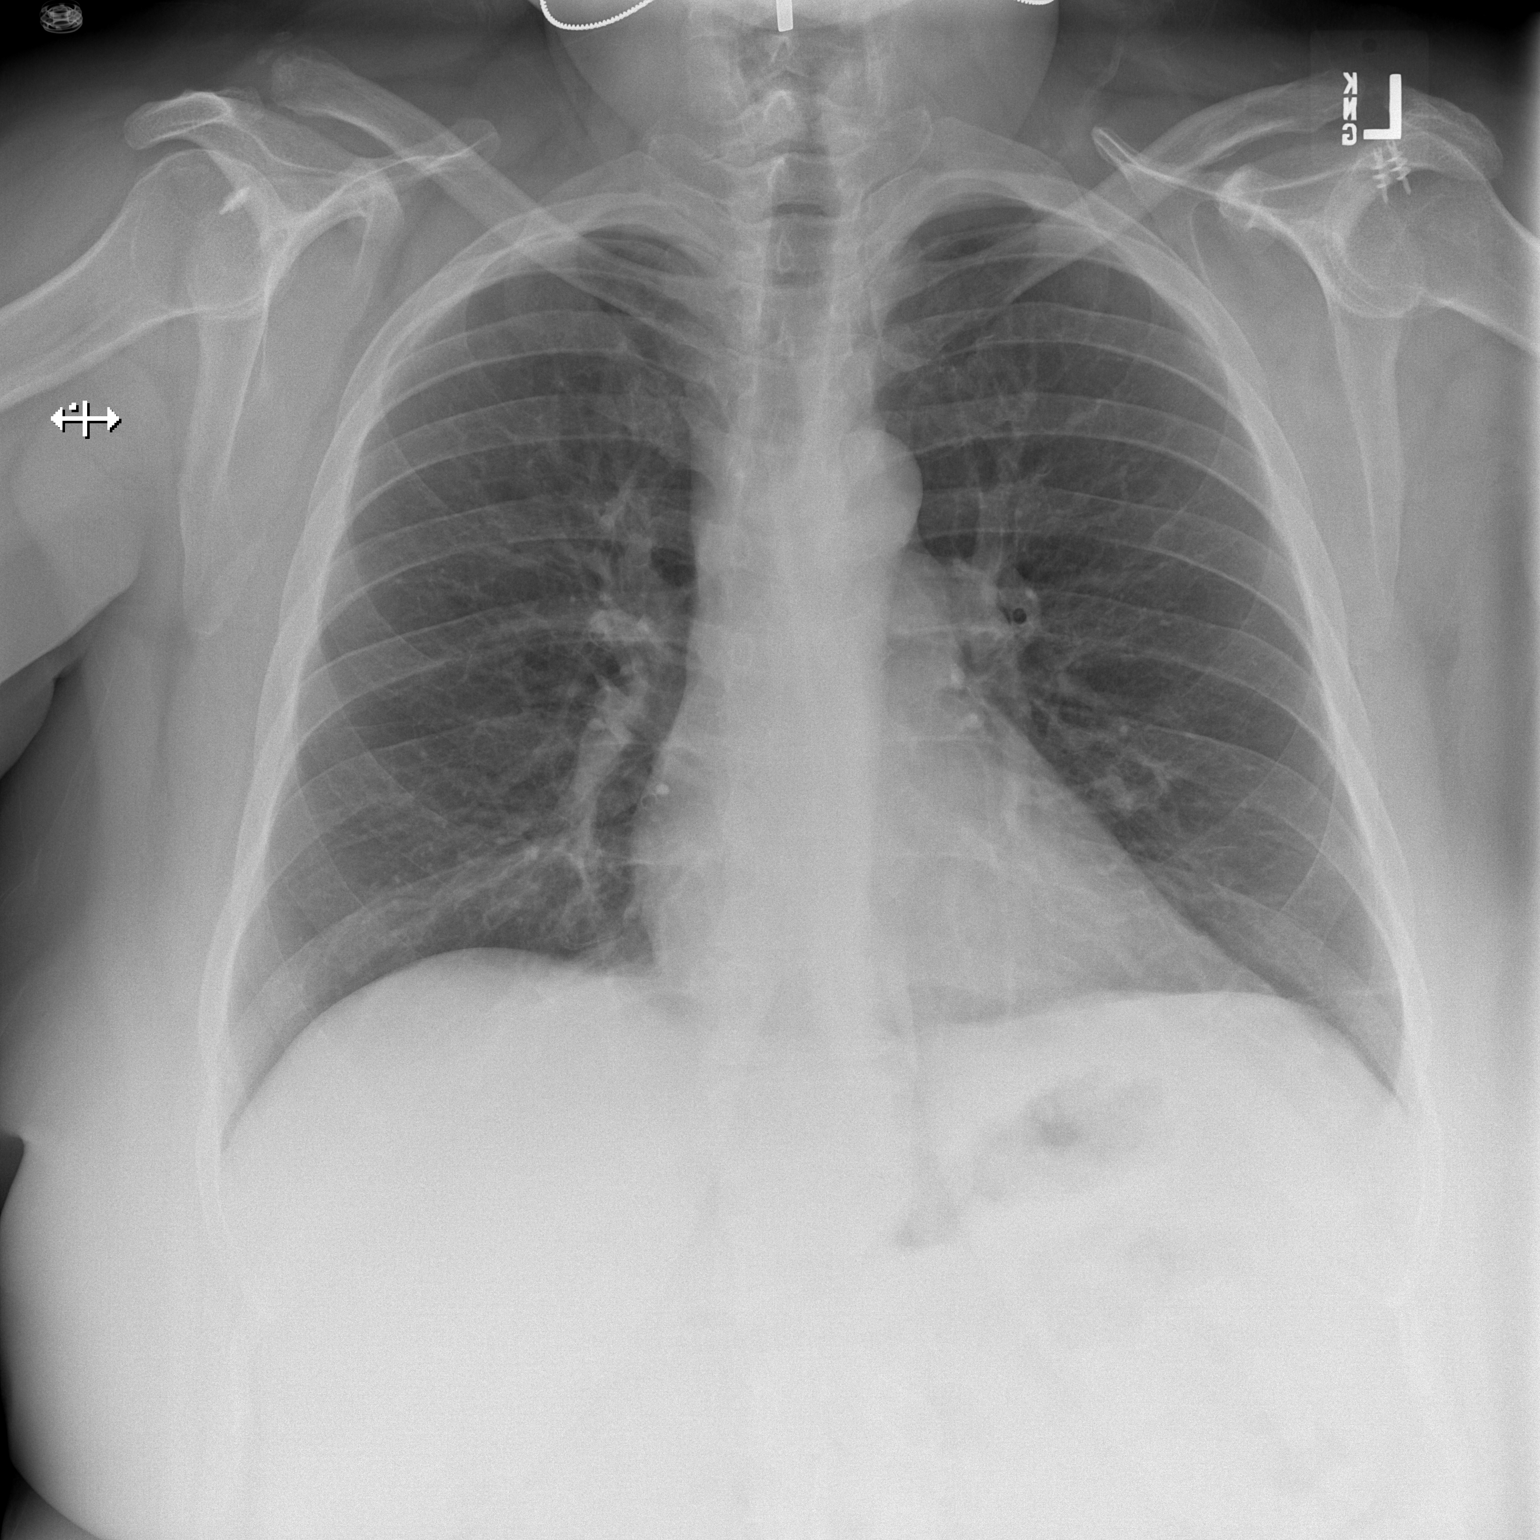

[w chest lat]
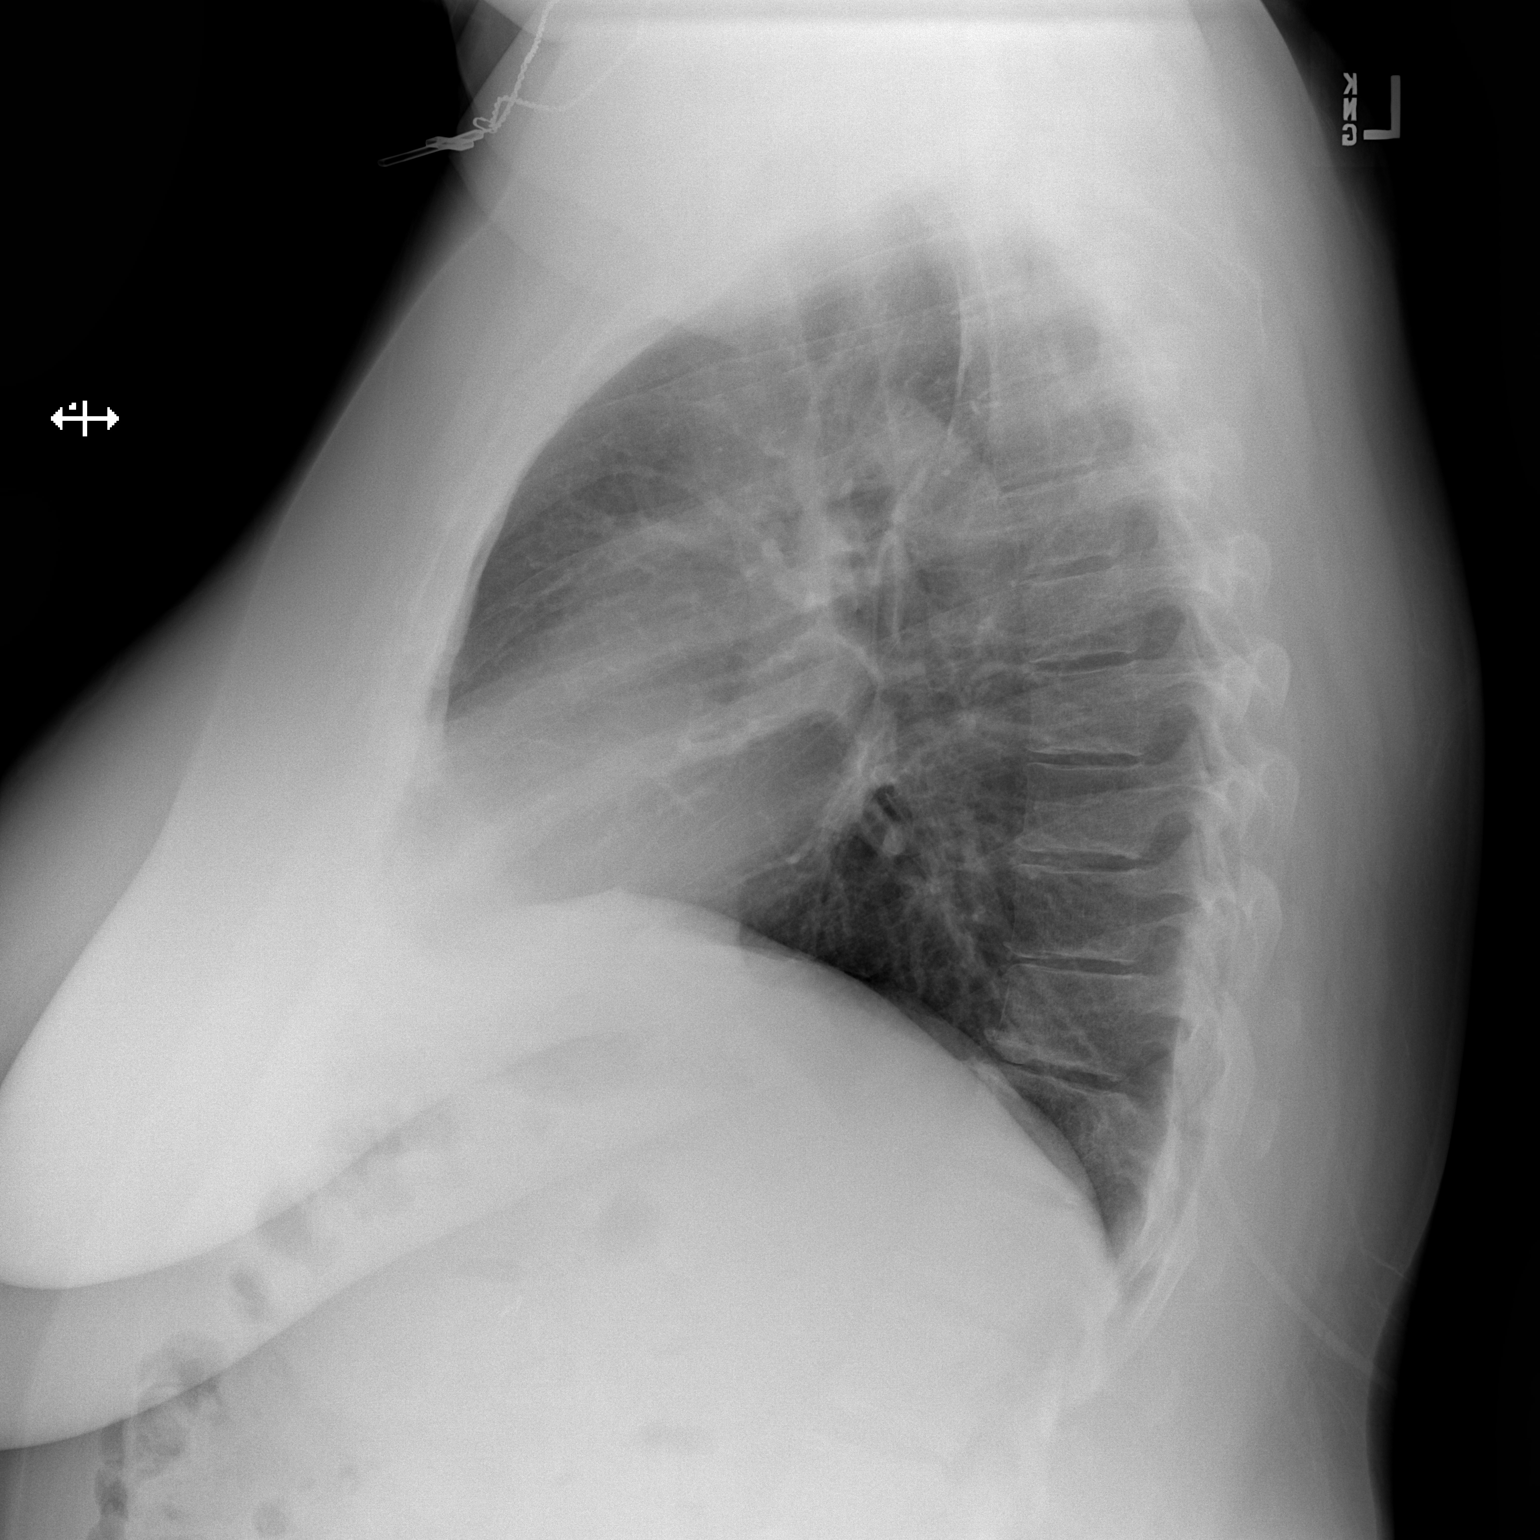

[2 of 2 positions shown; findings below may reference images not displayed]

FINDINGS: Normal sized heart. Interval minimal linear scarring at the left
lung base. Otherwise, clear lungs. Mild lower thoracic spine
degenerative changes. Bilateral shoulder fixation anchors.
IMPRESSION: No acute abnormality.

## 2018-05-08 MED ORDER — SULFAMETHOXAZOLE-TRIMETHOPRIM 800-160 MG PO TABS: 1.0000 | ORAL_TABLET | Freq: Two times a day (BID) | ORAL | 0 refills | Status: DC

## 2018-05-08 NOTE — Patient Instructions (Addendum)
Drink plenty of fluids  Take the Bactrim (sulfamethoxazole) 1 twice daily  We will try and let you know the results of the culture in a few days  Return if problems arise    If you have lab work done today you will be contacted with your lab results within the next 2 weeks.  If you have not heard from [place] then please contact us. The fastest way to get your results is to register for My Chart.   IF you received an x-ray today, you will receive an invoice from [facility]. Please contact [facility] at [phone] with questions or concerns regarding your invoice.   IF you received labwork today, you will receive an invoice from [facility]. Please contact [facility] at [phone] with questions or concerns regarding your invoice.   Our billing staff will not be able to assist you with questions regarding bills from these companies.  You will be contacted with the lab results as soon as they are available. The fastest way to get your results is to activate your My Chart account. Instructions are located on the last page of this paperwork. If you have not heard from [place] regarding the results in 2 weeks, please contact this office.

## 2018-05-08 NOTE — Progress Notes (Signed)
Patient ID: Valerie [name], female    DOB: 12-Aug-1960  Age: 57 y.o. MRN: [id number]  Chief Complaint  Patient presents with  . Dysuria    x1week urine frequency     Subjective:   57 year old lady who is here with the history of having had some dysuria for the past week.  During the daytime she has urinary frequency but has not been drinking much and urinating as much in the night.  She has a history of having had interstitial cystitis problems some years ago, and has had some renal insufficiency she was treated for couple of years ago.  Therefore she follows her renal stuff fairly closely.  She had noticed that her urine was a little more cloudy.  Current allergies, medications, problem list, past/family and social histories reviewed.  Objective:  BP 138/87   Pulse 79   Temp 98.2 F (36.8 C) (Oral)   Resp 18   Ht 5\' 5"  (1.651 m)   Wt (!) 301 lb 9.6 oz (136.8 kg)   SpO2 94%   BMI 50.19 kg/m   No major CVA tenderness.  Minimal tenderness on the right side.  Abdomen soft without mass or tenderness.  Assessment & Plan:   Assessment: 1. Urinary frequency   2. Dysuria       Plan: Results for orders placed or performed in visit on 05/08/18  POCT urinalysis dipstick  Result Value Ref Range   Color, UA yellow yellow   Clarity, UA clear clear   Glucose, UA negative negative mg/dL   Bilirubin, UA negative negative   Ketones, POC UA negative negative mg/dL   Spec Grav, UA 1.010 1.010 - 1.025   Blood, UA trace-intact (A) negative   pH, UA 6.5 5.0 - 8.0   Protein Ur, POC negative negative mg/dL   Urobilinogen, UA 0.2 0.2 or 1.0 E.U./dL   Nitrite, UA Negative Negative   Leukocytes, UA Small (1+) (A) Negative   Only minimal abnormality in the urine.  Not clearly defined UTI.  However with her health history will treat while cultures are pending.  Orders Placed This Encounter  Procedures  . Urine Culture    Order Specific Question:   Source    Answer:   urine  . POCT  urinalysis dipstick    Meds ordered this encounter  Medications  . sulfamethoxazole-trimethoprim (BACTRIM DS,SEPTRA DS) 800-160 MG tablet    Sig: Take 1 tablet by mouth 2 (two) times daily.    Dispense:  10 tablet    Refill:  0         Patient Instructions   Drink plenty of fluids  Take the Bactrim (sulfamethoxazole) 1 twice daily  We will try and let you know the results of the culture in a few days  Return if problems arise    If you have lab work done today you will be contacted with your lab results within the next 2 weeks.  If you have not heard from [place] then please contact us. The fastest way to get your results is to register for My Chart.   IF you received an x-ray today, you will receive an invoice from [facility]. Please contact [facility] at [phone] with questions or concerns regarding your invoice.   IF you received labwork today, you will receive an invoice from [place]. Please contact [facility] at [phone] with questions or concerns regarding your invoice.   Our billing staff will not be able to assist you with questions regarding bills  from these companies.  You will be contacted with the lab results as soon as they are available. The fastest way to get your results is to activate your My Chart account. Instructions are located on the last page of this paperwork. If you have not heard from [place] regarding the results in 2 weeks, please contact this office.        No follow-ups on file.   Ruben Reason, MD 05/08/2018

## 2018-05-10 ENCOUNTER — Telehealth: Payer: Self-pay | Admitting: Family Medicine

## 2018-05-10 NOTE — Telephone Encounter (Signed)
Please advise 

## 2018-05-10 NOTE — Telephone Encounter (Signed)
Spoke to pharmacy.  Someone already took care of this.

## 2018-05-10 NOTE — Telephone Encounter (Signed)
Copied from [place] [id number]. Topic: General - Other >> May 08, 2018  3:22 PM Yvette Rack wrote: Reason for CRM: Levada Dy with [facility] stated she needs to speak with Dr. Linna Darner regarding the Rx for sulfamethoxazole-trimethoprim (BACTRIM DS,SEPTRA DS) 800-160 MG tablet. Levada Dy stated there is a drug interaction with 2 of the medications pt takes and she would like to make provider aware and see if another medication can be prescribed. Levada Dy requests a call back. Cb# [phone]

## 2018-05-11 ENCOUNTER — Other Ambulatory Visit: Payer: Self-pay | Admitting: Neurology

## 2018-05-12 LAB — URINE CULTURE

## 2018-05-13 NOTE — Telephone Encounter (Signed)
Pt called very upset not understanding why she was only called in a 3 day supply of an antibiotic. She states that she has completed the course of her meds.

## 2018-05-13 NOTE — Telephone Encounter (Signed)
Pt does not want to make an appt. Pt symptoms has return. Pt urine is cloudy and she is urinating a lot. Pt was given only 6 macrobid abx for 3 day supply. Pt would like more abx. [address]

## 2018-05-13 NOTE — Telephone Encounter (Signed)
L/m for pt re urinary symptoms.  Please confirm if antibiotics were taken in entirety.  If ABX were completed, we will order a repeat Urine Culture under Dr. Brigitte Pulse.  If not completed, please complete antibiotics.

## 2018-05-14 ENCOUNTER — Other Ambulatory Visit: Payer: Self-pay

## 2018-05-14 ENCOUNTER — Encounter: Payer: Self-pay | Admitting: Family Medicine

## 2018-05-14 DIAGNOSIS — R350 Frequency of micturition: Secondary | ICD-10-CM

## 2018-05-14 MED ORDER — NITROFURANTOIN MONOHYD MACRO 100 MG PO CAPS: 100.0000 mg | ORAL_CAPSULE | Freq: Two times a day (BID) | ORAL | 0 refills | Status: AC

## 2018-05-14 NOTE — Telephone Encounter (Signed)
Called and LVM for pt to bring in another urine sample so we can send off for a culture.

## 2018-05-14 NOTE — Telephone Encounter (Signed)
Spoke with pt about symptoms and new Rx. After speaking with provider Dr. Linna Darner a rx was sent in for [facility] for her to take two times a day for 5 days, she verbalized understanding.

## 2018-05-20 DIAGNOSIS — M25811 Other specified joint disorders, right shoulder: Secondary | ICD-10-CM | POA: Diagnosis not present

## 2018-05-28 DIAGNOSIS — R1013 Epigastric pain: Secondary | ICD-10-CM | POA: Diagnosis not present

## 2018-05-28 DIAGNOSIS — K3184 Gastroparesis: Secondary | ICD-10-CM | POA: Diagnosis not present

## 2018-05-28 DIAGNOSIS — K861 Other chronic pancreatitis: Secondary | ICD-10-CM | POA: Diagnosis not present

## 2018-05-28 DIAGNOSIS — K210 Gastro-esophageal reflux disease with esophagitis: Secondary | ICD-10-CM | POA: Diagnosis not present

## 2018-06-03 ENCOUNTER — Ambulatory Visit (INDEPENDENT_AMBULATORY_CARE_PROVIDER_SITE_OTHER): Payer: Medicare Other | Admitting: Family Medicine

## 2018-06-03 ENCOUNTER — Other Ambulatory Visit: Payer: Self-pay

## 2018-06-03 ENCOUNTER — Telehealth: Payer: Self-pay | Admitting: Family Medicine

## 2018-06-03 ENCOUNTER — Encounter: Payer: Self-pay | Admitting: Family Medicine

## 2018-06-03 ENCOUNTER — Ambulatory Visit (HOSPITAL_COMMUNITY): Admission: RE | Admit: 2018-06-03 | Payer: Medicare Other | Source: Ambulatory Visit

## 2018-06-03 VITALS — BP 122/76 | HR 82 | Resp 16 | Ht 65.0 in | Wt 301.0 lb

## 2018-06-03 DIAGNOSIS — Z6841 Body Mass Index (BMI) 40.0 and over, adult: Secondary | ICD-10-CM

## 2018-06-03 DIAGNOSIS — Z7189 Other specified counseling: Secondary | ICD-10-CM

## 2018-06-03 DIAGNOSIS — R102 Pelvic and perineal pain: Secondary | ICD-10-CM

## 2018-06-03 DIAGNOSIS — R350 Frequency of micturition: Secondary | ICD-10-CM

## 2018-06-03 DIAGNOSIS — R7303 Prediabetes: Secondary | ICD-10-CM | POA: Diagnosis not present

## 2018-06-03 DIAGNOSIS — N183 Chronic kidney disease, stage 3 unspecified: Secondary | ICD-10-CM

## 2018-06-03 DIAGNOSIS — N10 Acute tubulo-interstitial nephritis: Secondary | ICD-10-CM

## 2018-06-03 DIAGNOSIS — R300 Dysuria: Secondary | ICD-10-CM | POA: Diagnosis not present

## 2018-06-03 DIAGNOSIS — R221 Localized swelling, mass and lump, neck: Secondary | ICD-10-CM

## 2018-06-03 DIAGNOSIS — I10 Essential (primary) hypertension: Secondary | ICD-10-CM

## 2018-06-03 LAB — POCT URINALYSIS DIP (MANUAL ENTRY)
Bilirubin, UA: NEGATIVE
Blood, UA: NEGATIVE
Glucose, UA: NEGATIVE mg/dL
Ketones, POC UA: NEGATIVE mg/dL
LEUKOCYTES UA: NEGATIVE
Nitrite, UA: NEGATIVE
PROTEIN UA: NEGATIVE mg/dL
Spec Grav, UA: 1.01 (ref 1.010–1.025)
UROBILINOGEN UA: 0.2 U/dL
pH, UA: 5.5 (ref 5.0–8.0)

## 2018-06-03 LAB — POC MICROSCOPIC URINALYSIS (UMFC): MUCUS RE: ABSENT

## 2018-06-03 MED ORDER — AMOXICILLIN-POT CLAVULANATE 875-125 MG PO TABS: 1.0000 | ORAL_TABLET | Freq: Two times a day (BID) | ORAL | 0 refills | Status: DC

## 2018-06-03 MED ORDER — FLUCONAZOLE 150 MG PO TABS: 150.0000 mg | ORAL_TABLET | Freq: Once | ORAL | 0 refills | Status: AC

## 2018-06-03 NOTE — Patient Instructions (Addendum)
If you have lab work done today you will be contacted with your lab results within the next 2 weeks.  If you have not heard from [place] then please contact us. The fastest way to get your results is to register for My Chart.   IF you received an x-ray today, you will receive an invoice from [facility]. Please contact [facility] at [phone] with questions or concerns regarding your invoice.   IF you received labwork today, you will receive an invoice from [place]. Please contact [facility] at [phone] with questions or concerns regarding your invoice.   Our billing staff will not be able to assist you with questions regarding bills from these companies.  You will be contacted with the lab results as soon as they are available. The fastest way to get your results is to activate your My Chart account. Instructions are located on the last page of this paperwork. If you have not heard from [place] regarding the results in 2 weeks, please contact this office.     Pyelonephritis, Adult Pyelonephritis is a kidney infection. The kidneys are the organs that filter a person's blood and move waste out of the bloodstream and into the urine. Urine passes from the kidneys, through the ureters, and into the bladder. There are two main types of pyelonephritis:  Infections that come on quickly without any warning (acute pyelonephritis).  Infections that last for a long period of time (chronic pyelonephritis). In most cases, the infection clears up with treatment and does not cause further problems. More severe infections or chronic infections can sometimes spread to the bloodstream or lead to other problems with the kidneys. What are the causes? This condition is usually caused by:  Bacteria traveling from the bladder to the kidney through infected urine. The urine in the bladder can become infected with bacteria from: ? Bladder infection (cystitis). ? Inflammation of the prostate  gland (prostatitis). ? Sexual intercourse, in females.  Bacteria traveling from the bloodstream to the kidney. What increases the risk? This condition is more likely to develop in:  Pregnant women.  Older people.  People who have diabetes.  People who have kidney stones or bladder stones.  People who have other abnormalities of the kidney or ureter.  People who have a catheter placed in the bladder.  People who have cancer.  People who are sexually active.  Women who use spermicides.  People who have had a prior urinary tract infection. What are the signs or symptoms? Symptoms of this condition include:  Frequent urination.  Strong or persistent urge to urinate.  Burning or stinging when urinating.  Abdominal pain.  Back pain.  Pain in the side or flank area.  Fever.  Chills.  Blood in the urine, or dark urine.  Nausea.  Vomiting. How is this diagnosed? This condition may be diagnosed based on:  Medical history and physical exam.  Urine tests.  Blood tests. You may also have imaging tests of the kidneys, such as an ultrasound or CT scan. How is this treated? Treatment for this condition may depend on the severity of the infection.  If the infection is mild and is found early, you may be treated with antibiotic medicines taken by mouth. You will need to drink fluids to remain hydrated.  If the infection is more severe, you may need to stay in the hospital and receive antibiotics given directly into a vein through an IV tube. You may also need to receive fluids  through an IV tube if you are not able to remain hydrated. After your hospital stay, you may need to take oral antibiotics for a period of time. Other treatments may be required, depending on the cause of the infection. Follow these instructions at home: Medicines  Take over-the-counter and prescription medicines only as told by your health care provider.  If you were prescribed an  antibiotic medicine, take it as told by your health care provider. Do not stop taking the antibiotic even if you start to feel better. General instructions  Drink enough fluid to keep your urine clear or pale yellow.  Avoid caffeine, tea, and carbonated beverages. They tend to irritate the bladder.  Urinate often. Avoid holding in urine for long periods of time.  Urinate before and after sex.  After a bowel movement, women should cleanse from front to back. Use each tissue only once.  Keep all follow-up visits as told by your health care provider. This is important. Contact a health care provider if:  Your symptoms do not get better after 2 days of treatment.  Your symptoms get worse.  You have a fever. Get help right away if:  You are unable to take your antibiotics or fluids.  You have shaking chills.  You vomit.  You have severe flank or back pain.  You have extreme weakness or fainting. This information is not intended to replace advice given to you by your health care provider. Make sure you discuss any questions you have with your health care provider. Document Released: 05/29/2005 Document Revised: 11/04/2015 Document Reviewed: 09/21/2014 Elsevier Interactive Patient Education  [employer].

## 2018-06-03 NOTE — Progress Notes (Signed)
Subjective:    Patient: Valerie [name]  DOB: [birth year]; 57 y.o.   MRN: [id number]  Chief Complaint  Patient presents with  . Hypertension    1 month follow-up   . Chronic Kidney Disease    HPI Was rx'd bactrim which is "highly contra-indicated" on benazepril.  She was called in 3d of [place] and then 2d off of it when it, and then back on for 5d after calling in again.  Again having bladder discomfort now.  Has had some dysuria over the past few days but not drinking as much water. No frequency, no hesitency, urgency, or incontinence. Normal appearance and odor has resolved. No n/v.  No f/c.  Night sweats have come back over the past several nights which had resolved.  Most pain around Right kidney starting yest and last night  Back has been killing her yesterday and today. Stomach burning but has not been carafate.  Gastroparesis isn't bothering her as much right now despite 13 mos since last botox.  Saw Dr. Watt Climes lastTuesday - refilled dexilant and call him whenever she feels she needs to do   Some pain in her Rt andenxa - on and off for about a month - will be a day or two and then off for a week - can't connect to food or activity.  No vag bleeding or d/c.  Feels like there is some sweling in her right neck  Weight up 4 lbs this morning form the day prior.   Taking the torsemide once every 2-3d but just following symptoms depending on whether weight increases and how puffy her hands/ankles feel determine if she decide she needs it. And when her lower legs starting aching - which they are today so she will take one later.  Using more hydrocodone sinceAugst  Taking vit D 5000iu/d, but cut calcium in half (at last labs), and then stopped taking the calcium several weeks ago. Geens plenty of calcium in diet with milk, yogurt, cheese.  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  Using less alprazolam as less stress snice resigning but then recently use has increased over the holidays. Still has lots of friends there  Medical History Past Medical History:  Diagnosis Date  . Acute ischemic colitis ([place])   . Anemia 05/01/2012  . Anxiety   . ARF (acute renal failure) ([place]) 12/28/2015  . Arthritis    "basically ~ q joint" (12/28/2015)  . Asthma   . Basal cell carcinoma of upper back ~ 2011  . Chronic right-sided headaches    and neck pain, relieved wiht PT and chronic practic manipulation  . Complete tear of right rotator cuff, recurrent 04/09/2018  . DDD (degenerative disc disease), lumbar   . Diarrhea    EPISODIC  . Fatigue    h/o excessive sedation, etiology unknown  . Fibromyalgia   . First degree heart block   . GERD (gastroesophageal reflux disease) 04/06/2017  . Headache(784.0)    "muscular w/migraine intensity; ~ 5-6 months" (12/28/2015)  . History of ischemic colitis 09/24/2016  . Hypertension   . Mental disorder    reports PTSD  . Microscopic polyangiitis ([place])   . Nerve pain 2011   nerve damage to right arm; normal EMG/NCV to RUExt after  shoulder surgery 12/2009  . Other bursal cyst, right shoulder 04/09/2018  . Pancreatitis    due to Effexor  . Pneumonia ~ 2014; 08/2015   Aspiration  . Polymyalgia rheumatica ([place]) onset 08/2015   changed diagnosis to MPA  . Precancerous melanosis ([place])    "2nd toe left foot; 5th toe of right foot; right temple; had them all cut off"  . Sleep apnea    CPAP-9   Past Surgical History:  Procedure Laterality Date  . BASAL CELL CARCINOMA EXCISION  ~ 2011   back  . BOTOX INJECTION N/A 11/06/2012   Procedure: BOTOX INJECTION;  Surgeon: Missy Sabins, MD;  [facility];  Service: Endoscopy;  Laterality: N/A;  . BOTOX INJECTION N/A 12/04/2013   Procedure: BOTOX INJECTION;   Surgeon: Missy Sabins, MD;  Location: [place];  Service: Endoscopy;  Laterality: N/A;  . BOTOX INJECTION N/A 02/26/2014   Procedure: BOTOX INJECTION;  Surgeon: Missy Sabins, MD;  Location: [place];  Service: Endoscopy;  Laterality: N/A;  . BOTOX INJECTION N/A 07/08/2015   Procedure: BOTOX INJECTION;  Surgeon: Teena Irani, MD;  [facility];  Service: Endoscopy;  Laterality: N/A;  . BOTOX INJECTION N/A 04/27/2016   Procedure: BOTOX INJECTION;  Surgeon: Teena Irani, MD;  [facility];  Service: Endoscopy;  Laterality: N/A;  . BOTOX INJECTION N/A 04/09/2017   Procedure: BOTOX INJECTION;  Surgeon: Clarene Essex, MD;  Location: [place];  Service: Endoscopy;  Laterality: N/A;  . ESOPHAGOGASTRODUODENOSCOPY  12/21/2011   Procedure: ESOPHAGOGASTRODUODENOSCOPY (EGD);  Surgeon: Missy Sabins, MD;  [facility] END[facility];  Service: Endoscopy;  Laterality: N/A;  . ESOPHAGOGASTRODUODENOSCOPY N/A 11/06/2012   Procedure: ESOPHAGOGASTRODUODENOSCOPY (EGD);  Surgeon: Missy Sabins, MD;  Location: [facility];  Service: Endoscopy;  Laterality: N/A;  . ESOPHAGOGASTRODUODENOSCOPY N/A 12/04/2013   Procedure: ESOPHAGOGASTRODUODENOSCOPY (EGD);  Surgeon: Missy Sabins, MD;  Location: [facility] ENDOSCOPY;  Service: Endoscopy;  Laterality: N/A;  . ESOPHAGOGASTRODUODENOSCOPY N/A 02/26/2014   Procedure: ESOPHAGOGASTRODUODENOSCOPY (EGD);  Surgeon: Missy Sabins, MD;  Location: [facility] ENDOSCOPY;  Service: Endoscopy;  Laterality: N/A;  . ESOPHAGOGASTRODUODENOSCOPY N/A 04/09/2017   Procedure: ESOPHAGOGASTRODUODENOSCOPY (EGD);  Surgeon: Clarene Essex, MD;  Location: [place];  Service: Endoscopy;  Laterality: N/A;  . ESOPHAGOGASTRODUODENOSCOPY (EGD) WITH PROPOFOL N/A 07/08/2015   Procedure: ESOPHAGOGASTRODUODENOSCOPY (EGD) WITH PROPOFOL;  Surgeon: Teena Irani, MD;  [facility];  Service: Endoscopy;  Laterality: N/A;  . ESOPHAGOGASTRODUODENOSCOPY (EGD) WITH PROPOFOL N/A 04/27/2016   Procedure:  ESOPHAGOGASTRODUODENOSCOPY (EGD) WITH PROPOFOL;  Surgeon: Teena Irani, MD;  [facility];  Service: Endoscopy;  Laterality: N/A;  . EXCISION MASS UPPER EXTREMETIES Right 04/09/2018   Procedure: EXCISION RIGHT SHOULDER CYST;  Surgeon: Marchia Bond, MD;  [facility];  Service: Orthopedics;  Laterality: Right;  Interscalene Block  . FLEXIBLE SIGMOIDOSCOPY  05/01/2012   Procedure: FLEXIBLE SIGMOIDOSCOPY;  Surgeon: Jeryl Columbia, MD;  [facility];  Service: Endoscopy;  Laterality: N/A;  unprepped  . FLEXIBLE SIGMOIDOSCOPY Left 09/28/2016   Procedure: FLEXIBLE SIGMOIDOSCOPY;  Surgeon: Wilford Corner, MD;  [facility];  Service: Endoscopy;  Laterality: Left;  . LAPAROSCOPIC CHOLECYSTECTOMY  01/2002  . NASAL SEPTOPLASTY W/ TURBINOPLASTY  1998  . SHOULDER ARTHROSCOPY WITH ROTATOR CUFF REPAIR Right 04/09/2018   Procedure: RIGHT SHOULDER ARTHROSCOPY WITH EXTENSIVE DEBRIDEMENT;  Surgeon: Marchia Bond, MD;  [facility];  Service: Orthopedics;  Laterality: Right;  Interscalene Block  . SHOULDER ARTHROSCOPY WITH ROTATOR CUFF REPAIR AND OPEN BICEPS TENODESIS Bilateral 2010-2013  left-right   Current Outpatient Medications on File Prior to Visit  Medication Sig Dispense Refill  . albuterol (PROAIR HFA) 108 (90 Base) MCG/ACT inhaler Inhale 2 puffs into the lungs every 4 (four) hours as needed for wheezing or shortness of breath. 8.5 each 11  . allopurinol (ZYLOPRIM) 300 MG tablet TAKE 1/2 TABLET BY MOUTH ONCE A DAY 45 tablet 0  . ALPRAZolam (XANAX) 1 MG tablet TAKE ONE TABLET BY MOUTH THREE TIMES A DAY AS NEEDED FOR ANXIETY (Patient taking differently: 1 mg 3 (three) times daily as needed for anxiety. ) 90 tablet 1  . baclofen (LIORESAL) 10 MG tablet Take 10 mg by mouth 3 (three) times daily as needed for muscle spasms.     . benazepril (LOTENSIN) 20 MG tablet Take 1 tablet (20 mg total) daily by mouth. 90 tablet 3  . Calcium Carb-Cholecalciferol (CALCIUM 600 + D PO)  Take 0.5 tablets by mouth daily.    Marland Kitchen CARAFATE 1 GM/10ML suspension TAKE 10 ML'S BY MOUTH FOUR TIMES A DAY AS NEEDED FOR STOMACH PAIN 420 mL 1  . cetirizine (ZYRTEC) 10 MG tablet Take 10 mg by mouth daily.     . Cholecalciferol (VITAMIN D3) 5000 units CAPS Take 5,000 Units by mouth daily.     Marland Kitchen dexlansoprazole (DEXILANT) 60 MG capsule Take 60 mg by mouth 2 (two) times daily.    . fexofenadine (ALLEGRA) 180 MG tablet Take 180 mg by mouth daily. Switches between Allegra and Zyrtec as needed for allergies    . fluticasone (FLONASE) 50 MCG/ACT nasal spray PLACE 2 SPRAYS INTO BOTH NOSTRILS DAILY (Patient taking differently: Place 2 sprays into both nostrils at bedtime. ) 48 g 1  . HYDROcodone-acetaminophen (NORCO) 10-325 MG per tablet Take 1-2 tablets by mouth every 6 (six) hours as needed for moderate pain (pain.).     Marland Kitchen hydrOXYzine (ATARAX/VISTARIL) 10 MG tablet Take 1-3 tablets (10-30 mg total) by mouth every 6 (six) hours as needed for anxiety. (Patient taking differently: Take 10-30 mg by mouth 3 (three) times daily as needed for anxiety. ) 180 tablet 1  . ibuprofen (ADVIL,MOTRIN) 600 MG tablet Take 600 mg by mouth daily as needed (migraines).    Marland Kitchen ipratropium (ATROVENT) 0.03 % nasal spray Place 2 sprays 4 (four) times daily into the nose. 30 mL 1  . Melatonin 5 MG TABS Take 5-10 mg by mouth at bedtime as needed (sleep).    . metoprolol succinate (TOPROL-XL) 100 MG 24 hr tablet Take 1 tablet (100 mg total) by mouth daily. Take with or immediately following a meal. 90 tablet 3  . modafinil (PROVIGIL) 200 MG tablet TAKE 1/2 TABLET BY MOUTH TWO TIMES A DAY 90 tablet 1  . montelukast (SINGULAIR) 10 MG tablet TAKE ONE TABLET BY MOUTH EVERY NIGHT AT BEDTIME (Patient taking differently: Take 10 mg by mouth at bedtime. ) 90 tablet 2  . morphine (MS CONTIN) 30 MG 12 hr tablet Take 30 mg by mouth every 8 (eight) hours. Not from [facility]    . Multiple Vitamins-Minerals (MULTIVITAMIN PO) Take 1 tablet by mouth daily.     . Olopatadine HCl (PAZEO) 0.7 % SOLN Apply 1 drop to eye daily. (Patient taking differently: Apply 1 drop to eye daily as needed (allergies). ) 2.5 mL 11  . ondansetron (ZOFRAN) 4 MG tablet Take 1 tablet (4 mg total) by mouth every 8 (eight) hours as needed for nausea or vomiting. 20 tablet 0  . Pancrelipase, Lip-Prot-Amyl, (CREON) 24000 UNITS CPEP  Take 72,000 Units by mouth 3 (three) times daily as needed (for pancreatitis).     . promethazine (PHENERGAN) 25 MG tablet Take 1 tablet (25 mg total) by mouth every 6 (six) hours as needed for nausea or vomiting. 30 tablet 1  . pseudoephedrine (SUDAFED) 120 MG 12 hr tablet Take 120 mg by mouth daily as needed for congestion.    Marland Kitchen spironolactone (ALDACTONE) 100 MG tablet Take 1 tablet (100 mg total) by mouth daily. 90 tablet 1  . torsemide (DEMADEX) 10 MG tablet Take 1 tablet (10 mg total) by mouth daily as needed (edema).     No current facility-administered medications on file prior to visit.    Allergies  Allergen Reactions  . Dilaudid [Hydromorphone Hcl] Other (See Comments)    Headache   . Effexor [Venlafaxine]     pancreatitis  . Pregabalin Other (See Comments)    Drug induced stupor  . Adhesive [Tape]     Paper tape causes blood red marks  . Metoclopramide Hcl Other (See Comments)    "crawling inside"  . Neurontin [Gabapentin] Other (See Comments)    Tremors   . Other Other (See Comments)    Avoid all SSRI's and SNRI's- damaged pancreas  . Serotonin Reuptake Inhibitors (Ssris) Other (See Comments)    Damage to pancreas and bowel/urinary incontinence    Family History  Problem Relation Age of Onset  . Heart disease Mother   . COPD Mother   . Arthritis Mother   . Cancer Mother   . Hypertension Mother   . Arthritis Paternal Grandmother   . Heart disease Paternal Grandmother   . Heart disease Paternal Grandfather    Social History   Socioeconomic History  . Marital status: Single    Spouse name: Not on file  . Number of  children: 0  . Years of education: College  . Highest education level: Not on file  Occupational History  . Occupation: disabled  Social Needs  . Financial resource strain: Not on file  . Food insecurity:    Worry: Not on file    Inability: Not on file  . Transportation needs:    Medical: Not on file    Non-medical: Not on file  Tobacco Use  . Smoking status: Never Smoker  . Smokeless tobacco: Never Used  Substance and Sexual Activity  . Alcohol use: No  . Drug use: No  . Sexual activity: Not Currently  Lifestyle  . Physical activity:    Days per week: Not on file    Minutes per session: Not on file  . Stress: Not on file  Relationships  . Social connections:    Talks on phone: Not on file    Gets together: Not on file    Attends religious service: Not on file    Active member of club or organization: Not on file    Attends meetings of clubs or organizations: Not on file    Relationship status: Not on file  Other Topics Concern  . Not on file  Social History Narrative   Caffeine 2 cups iced tea daily avg.   Depression screen [facility] 2/9 06/03/2018 05/08/2018 04/24/2018 03/12/2018 02/28/2018  Decreased Interest 0 0 0 0 0  Down, Depressed, Hopeless 0 0 0 0 0  PHQ - 2 Score 0 0 0 0 0  Altered sleeping - - - - -  Tired, decreased energy - - - - -  Change in appetite - - - - -  Feeling  bad or failure about yourself  - - - - -  Trouble concentrating - - - - -  Moving slowly or fidgety/restless - - - - -  Suicidal thoughts - - - - -  PHQ-9 Score - - - - -  Difficult doing work/chores - - - - -  Some recent data might be hidden    ROS As noted in HPI  Objective:  BP 122/76   Pulse 82   Resp 16   Ht 5\' 5"  (1.651 m)   Wt (!) 301 lb (136.5 kg)   SpO2 95%   BMI 50.09 kg/m  Physical Exam Constitutional:      General: She is not in acute distress.    Appearance: She is well-developed. She is not diaphoretic.  HENT:     Head: Normocephalic and atraumatic.     Right  Ear: External ear normal.     Left Ear: External ear normal.  Eyes:     General: No scleral icterus.    Conjunctiva/sclera: Conjunctivae normal.  Neck:     Musculoskeletal: Normal range of motion and neck supple.     Thyroid: No thyromegaly.  Cardiovascular:     Rate and Rhythm: Normal rate and regular rhythm.     Heart sounds: Normal heart sounds.  Pulmonary:     Effort: Pulmonary effort is normal. No respiratory distress.     Breath sounds: Normal breath sounds.  Lymphadenopathy:     Cervical: No cervical adenopathy.  Skin:    General: Skin is warm and dry.     Findings: No erythema.  Neurological:     Mental Status: She is alert and oriented to person, place, and time.  Psychiatric:        Behavior: Behavior normal.     [place] TESTING Office Visit on 06/03/2018  Component Date Value Ref Range Status  . Color, UA 06/03/2018 yellow  yellow Final  . Clarity, UA 06/03/2018 clear  clear Final  . Glucose, UA 06/03/2018 negative  negative mg/dL Final  . Bilirubin, UA 06/03/2018 negative  negative Final  . Ketones, POC UA 06/03/2018 negative  negative mg/dL Final  . Spec Grav, UA 06/03/2018 1.010  1.010 - 1.025 Final  . Blood, UA 06/03/2018 negative  negative Final  . pH, UA 06/03/2018 5.5  5.0 - 8.0 Final  . Protein Ur, POC 06/03/2018 negative  negative mg/dL Final  . Urobilinogen, UA 06/03/2018 0.2  0.2 or 1.0 E.U./dL Final  . Nitrite, UA 06/03/2018 Negative  Negative Final  . Leukocytes, UA 06/03/2018 Negative  Negative Final     Assessment & Plan:   1. Urinary frequency   2. CKD (chronic kidney disease) stage 3, GFR 30-59 ml/min (HCC)   3. Prediabetes   4. Essential hypertension   5. Encounter for herb and vitamin supplement management   6. Morbid obesity with BMI of 45.0-49.9, adult ([place])   7. Morbid obesity with body mass index (BMI) of 50.0 to 59.9 in adult ([place])   8. Hypercalcemia   9. Dysuria   10. Acute pyelonephritis   11. Adnexal pain   12. Neck mass      Patient will continue on current chronic medications other than changes noted above, so ok to refill when needed.   See after visit summary for patient specific instructions.  Orders Placed This Encounter  Procedures  . Urine Culture  . US THYROID    Standing Status:   Future    Standing Expiration Date:  08/05/2019    Scheduling Instructions:     Pt would like scheduled before the end of the year    Order Specific Question:   Reason for Exam (SYMPTOM  OR DIAGNOSIS REQUIRED)    Answer:   enlargement of right upper thyroid/lymph glands x sev wks, unchanging    Order Specific Question:   Preferred imaging location?    Answer:    Hospital  . US Pelvic Complete With Transvaginal    Standing Status:   Future    Standing Expiration Date:   08/05/2019    Scheduling Instructions:     Pt would like to have scheduled before the year end if at all possible    Order Specific Question:   Reason for Exam (SYMPTOM  OR DIAGNOSIS REQUIRED)    Answer:   intermittent right adnexal pain x 1 mo    Order Specific Question:   Preferred imaging location?    Answer:   [facility]  . Microalbumin / creatinine urine ratio  . VITAMIN D 25 Hydroxy (Vit-D Deficiency, Fractures)  . PTH, Intact and Calcium  . CBC with Differential/Platelet  . Comprehensive metabolic panel  . Basic metabolic panel  . VITAMIN D 25 Hydroxy (Vit-D Deficiency, Fractures)  . CBC with Differential/Platelet  . PTH, intact and calcium  . POCT urinalysis dipstick  . POCT Microscopic Urinalysis ([facility])    Meds ordered this encounter  Medications  . amoxicillin-clavulanate (AUGMENTIN) 875-125 MG tablet    Sig: Take 1 tablet by mouth 2 (two) times daily.    Dispense:  14 tablet    Refill:  0  . fluconazole (DIFLUCAN) 150 MG tablet    Sig: Take 1 tablet (150 mg total) by mouth once for 1 dose. Repeat if needed after 3 days.    Dispense:  2 tablet    Refill:  0    Patient verbalized to me that they  understand the following: diagnosis, what is being done for them, what to expect and what should be done at home.  Their questions have been answered. They understand that I am unable to predict every possible medication interaction or adverse outcome and that if any unexpected symptoms arise, they should contact us and their pharmacist, as well as never hesitate to seek urgent/emergent care at [facility] or ER if they think it might be warranted.    Over 40 min spent in face-to-face evaluation of and consultation with patient and coordination of care.  Over 50% of this time was spent counseling this patient regarding above.  Delman Cheadle, MD, MPH Primary Care at [place] [address] [place],   [phone] Office phone  [phone] Office fax  06/03/18 11:25 AM

## 2018-06-03 NOTE — Telephone Encounter (Signed)
Called patient to adv her of appnt with [facility] today for her US thyroid and left message  Patient returned call and I tried to explain to her that Dr. Brigitte Pulse had it under STAT and it had to be scheduled today she stated it was in stat because it needed to be done by the end of the year Peter [place] spoke to [place] and it was explained to [place] that Dr. Brigitte Pulse was unable to issue another order and she should have it done today  Patient Disconnected call on me

## 2018-06-03 NOTE — Telephone Encounter (Signed)
Pt seen in clinic - UClx +, treated w/ antibiotic by colleague Dr. Linna Darner

## 2018-06-04 LAB — BASIC METABOLIC PANEL
BUN/Creatinine Ratio: 13 (ref 9–23)
BUN: 16 mg/dL (ref 6–24)
CO2: 21 mmol/L (ref 20–29)
Calcium: 9.8 mg/dL (ref 8.7–10.2)
Chloride: 100 mmol/L (ref 96–106)
Creatinine, Ser: 1.26 mg/dL — ABNORMAL HIGH (ref 0.57–1.00)
GFR calc Af Amer: 55 mL/min/{1.73_m2} — ABNORMAL LOW (ref >59)
GFR calc non Af Amer: 48 mL/min/{1.73_m2} — ABNORMAL LOW (ref >59)
Glucose: 187 mg/dL — ABNORMAL HIGH (ref 65–99)
Potassium: 4.4 mmol/L (ref 3.5–5.2)
Sodium: 137 mmol/L (ref 134–144)

## 2018-06-04 LAB — PTH, INTACT AND CALCIUM: PTH: 26 pg/mL (ref 15–65)

## 2018-06-04 LAB — COMPREHENSIVE METABOLIC PANEL
A/G RATIO: 1.8 (ref 1.2–2.2)
ALT: 15 IU/L (ref 0–32)
AST: 16 IU/L (ref 0–40)
Albumin: 4.4 g/dL (ref 3.5–5.5)
Alkaline Phosphatase: 57 IU/L (ref 39–117)
BUN / CREAT RATIO: 14 (ref 9–23)
BUN: 17 mg/dL (ref 6–24)
Bilirubin Total: 0.3 mg/dL (ref 0.0–1.2)
CO2: 20 mmol/L (ref 20–29)
Calcium: 9.8 mg/dL (ref 8.7–10.2)
Chloride: 102 mmol/L (ref 96–106)
Creatinine, Ser: 1.18 mg/dL — ABNORMAL HIGH (ref 0.57–1.00)
GFR calc Af Amer: 60 mL/min/{1.73_m2} (ref >59)
GFR calc non Af Amer: 52 mL/min/{1.73_m2} — ABNORMAL LOW (ref >59)
Globulin, Total: 2.4 g/dL (ref 1.5–4.5)
Glucose: 189 mg/dL — ABNORMAL HIGH (ref 65–99)
Potassium: 4.3 mmol/L (ref 3.5–5.2)
Sodium: 139 mmol/L (ref 134–144)
Total Protein: 6.8 g/dL (ref 6.0–8.5)

## 2018-06-04 LAB — CBC WITH DIFFERENTIAL/PLATELET
BASOS: 1 %
Basophils Absolute: 0.1 10*3/uL (ref 0.0–0.2)
EOS (ABSOLUTE): 0.2 10*3/uL (ref 0.0–0.4)
Eos: 2 %
Hematocrit: 51.7 % — ABNORMAL HIGH (ref 34.0–46.6)
Hemoglobin: 17 g/dL — ABNORMAL HIGH (ref 11.1–15.9)
Immature Grans (Abs): 0 10*3/uL (ref 0.0–0.1)
Immature Granulocytes: 0 %
Lymphocytes Absolute: 3.1 10*3/uL (ref 0.7–3.1)
Lymphs: 31 %
MCH: 29.8 pg (ref 26.6–33.0)
MCHC: 32.9 g/dL (ref 31.5–35.7)
MCV: 91 fL (ref 79–97)
Monocytes Absolute: 0.5 10*3/uL (ref 0.1–0.9)
Monocytes: 5 %
NEUTROS ABS: 6.3 10*3/uL (ref 1.4–7.0)
Neutrophils: 61 %
Platelets: 394 10*3/uL (ref 150–450)
RBC: 5.71 x10E6/uL — ABNORMAL HIGH (ref 3.77–5.28)
RDW: 13.4 % (ref 12.3–15.4)
WBC: 10.3 10*3/uL (ref 3.4–10.8)

## 2018-06-04 LAB — MICROALBUMIN / CREATININE URINE RATIO
Creatinine, Urine: 72.5 mg/dL
Microalbumin, Urine: <3 ug/mL

## 2018-06-04 LAB — VITAMIN D 25 HYDROXY (VIT D DEFICIENCY, FRACTURES): Vit D, 25-Hydroxy: 44 ng/mL (ref 30.0–100.0)

## 2018-06-05 ENCOUNTER — Other Ambulatory Visit: Payer: Self-pay | Admitting: Family Medicine

## 2018-06-06 NOTE — Telephone Encounter (Signed)
Requested Prescriptions  Pending Prescriptions Disp Refills  . montelukast (SINGULAIR) 10 MG tablet [Pharmacy Med Name: MONTELUKAST SOD 10 MG TABLET] 90 tablet 1    Sig: TAKE ONE TABLET BY MOUTH EVERY NIGHT AT BEDTIME     Pulmonology:  Leukotriene Inhibitors Passed - 06/05/2018 10:16 PM      Passed - Valid encounter within last 12 months    Recent Outpatient Visits          3 days ago Urinary frequency   [facility], Laurey Arrow, MD   4 weeks ago Urinary frequency   [facility] at [facility], Fenton Malling, MD   1 month ago Essential hypertension   [facility], Laurey Arrow, MD   2 months ago Chronic right shoulder pain   [facility], Lilia Argue, MD   3 months ago Subcutaneous mass of supraclavicular area   [facility], Lilia Argue, MD      Future Appointments            In 4 weeks Shawnee Knapp, MD [facility] at [place], [facility]

## 2018-06-07 LAB — URINE CULTURE

## 2018-06-10 ENCOUNTER — Ambulatory Visit (HOSPITAL_COMMUNITY): Payer: Medicare Other

## 2018-06-10 ENCOUNTER — Encounter: Payer: Self-pay | Admitting: Family Medicine

## 2018-06-14 ENCOUNTER — Ambulatory Visit (HOSPITAL_COMMUNITY)
Admission: RE | Admit: 2018-06-14 | Discharge: 2018-06-14 | Disposition: A | Discharge status: Home / Self Care | Payer: Medicare Other | Source: Ambulatory Visit | Attending: Family Medicine | Admitting: Family Medicine

## 2018-06-14 DIAGNOSIS — R102 Pelvic and perineal pain: Secondary | ICD-10-CM | POA: Diagnosis not present

## 2018-06-14 DIAGNOSIS — R221 Localized swelling, mass and lump, neck: Secondary | ICD-10-CM | POA: Insufficient documentation

## 2018-06-14 DIAGNOSIS — E042 Nontoxic multinodular goiter: Secondary | ICD-10-CM | POA: Diagnosis not present

## 2018-06-16 ENCOUNTER — Encounter: Payer: Self-pay | Admitting: Family Medicine

## 2018-06-17 ENCOUNTER — Telehealth: Payer: Self-pay | Admitting: Family Medicine

## 2018-06-17 NOTE — Telephone Encounter (Signed)
Copied from [place] [phone]. Topic: Quick Communication - See Telephone Encounter >> Jun 17, 2018  9:25 AM Lionel December wrote: CRM for notification. See Telephone encounter for: 06/17/18. Pt sent this message on mychart....Marland KitchenMarland KitchenWill you please give me some information and/or insight as to why my RBC, Hematocrit and Hemoglobin are suddenly elevated? It has me very concerned and wondering what is happening now. Would like a call back asap please.

## 2018-06-18 ENCOUNTER — Telehealth: Payer: Self-pay | Admitting: Family Medicine

## 2018-06-18 DIAGNOSIS — D751 Secondary polycythemia: Secondary | ICD-10-CM

## 2018-06-18 NOTE — Telephone Encounter (Signed)
Pt calling to request review of labs. States has left several messages. Labs from 06/03/18 viewed on MyChart by patient. Concerned with H& H elevation. Pt tearful during call. "This is not normal for me."  Pt made aware messages where given to Dr. Nolon Rod as she is covering for Dr. Brigitte Pulse while she is on leave. [place] called and spoke with Hassan Rowan; pt made aware Dr. Nolon Rod would review in AM.   Pt states she would like to speak to MD she has already seen; Dr. Pamella Pert or Dr. Carlota Raspberry. States she does not want to speak with MD who she does not know, who is not familiar with her.  Pt also states she had appt with Dr. Brigitte Pulse 07/04/2018 and has been rescheduled for 08/06/2018. Pt questioning if she should have lab redrawn before appt.. Pt continues to be tearful throughout call. Reassurance provided. Assured [place] would send message to practice. CB [phone]

## 2018-06-18 NOTE — Telephone Encounter (Signed)
Please advise 

## 2018-06-18 NOTE — Telephone Encounter (Signed)
MyChart message sent to pt about their appointment on 07/04/18 with Dr Shaw °

## 2018-06-19 NOTE — Telephone Encounter (Signed)
Please advise 

## 2018-06-20 ENCOUNTER — Encounter: Payer: Self-pay | Admitting: Family Medicine

## 2018-06-20 DIAGNOSIS — D751 Secondary polycythemia: Secondary | ICD-10-CM

## 2018-06-20 DIAGNOSIS — N183 Chronic kidney disease, stage 3 unspecified: Secondary | ICD-10-CM

## 2018-06-20 NOTE — Telephone Encounter (Signed)
Spoke with patient Discussed CBC and thyroid US She reports nightly use of CPAP, does not smoke She reports that 10-14 days ago she had the flu, left cervical LN not palpable Repeat CBC in 2 weeks Repeat US in 4 weeks

## 2018-06-20 NOTE — Addendum Note (Signed)
Addended by: Rutherford Guys on: 06/20/2018 12:29 PM   Modules accepted: Orders

## 2018-06-23 ENCOUNTER — Encounter: Payer: Self-pay | Admitting: Family Medicine

## 2018-06-26 ENCOUNTER — Encounter: Payer: Self-pay | Admitting: Family Medicine

## 2018-06-26 ENCOUNTER — Ambulatory Visit (INDEPENDENT_AMBULATORY_CARE_PROVIDER_SITE_OTHER): Payer: Medicare Other | Admitting: Family Medicine

## 2018-06-26 ENCOUNTER — Other Ambulatory Visit: Payer: Self-pay

## 2018-06-26 VITALS — BP 133/84 | HR 82 | Temp 98.0°F | Resp 16 | Wt 302.0 lb

## 2018-06-26 DIAGNOSIS — N183 Chronic kidney disease, stage 3 unspecified: Secondary | ICD-10-CM

## 2018-06-26 DIAGNOSIS — E042 Nontoxic multinodular goiter: Secondary | ICD-10-CM

## 2018-06-26 DIAGNOSIS — D751 Secondary polycythemia: Secondary | ICD-10-CM

## 2018-06-26 DIAGNOSIS — R590 Localized enlarged lymph nodes: Secondary | ICD-10-CM | POA: Diagnosis not present

## 2018-06-26 LAB — COMPREHENSIVE METABOLIC PANEL
ALBUMIN: 4.2 g/dL (ref 3.5–5.5)
ALT: 16 IU/L (ref 0–32)
AST: 12 IU/L (ref 0–40)
Albumin/Globulin Ratio: 1.5 (ref 1.2–2.2)
Alkaline Phosphatase: 60 IU/L (ref 39–117)
BUN/Creatinine Ratio: 20 (ref 9–23)
BUN: 25 mg/dL — AB (ref 6–24)
Bilirubin Total: 0.3 mg/dL (ref 0.0–1.2)
CO2: 21 mmol/L (ref 20–29)
Calcium: 10 mg/dL (ref 8.7–10.2)
Chloride: 100 mmol/L (ref 96–106)
Creatinine, Ser: 1.24 mg/dL — ABNORMAL HIGH (ref 0.57–1.00)
GFR calc Af Amer: 56 mL/min/{1.73_m2} — ABNORMAL LOW (ref >59)
GFR calc non Af Amer: 49 mL/min/{1.73_m2} — ABNORMAL LOW (ref >59)
GLUCOSE: 133 mg/dL — AB (ref 65–99)
Globulin, Total: 2.8 g/dL (ref 1.5–4.5)
Potassium: 4.6 mmol/L (ref 3.5–5.2)
Sodium: 137 mmol/L (ref 134–144)
Total Protein: 7 g/dL (ref 6.0–8.5)

## 2018-06-26 LAB — POCT CBC
Granulocyte percent: 62.7 %G (ref 37–80)
HCT, POC: 39.2 % (ref 29–41)
Hemoglobin: 13 g/dL (ref 11–14.6)
Lymph, poc: 2.6 (ref 0.6–3.4)
MCH, POC: 29.7 pg (ref 27–31.2)
MCHC: 33.3 g/dL (ref 31.8–35.4)
MCV: 89.4 fL (ref 76–111)
MID (cbc): 0.3 (ref 0–0.9)
MPV: 8.1 fL (ref 0–99.8)
POC Granulocyte: 4.9 — AB (ref 2–6.9)
POC LYMPH %: 33.5 % (ref 10–50)
POC MID %: 3.8 %M (ref 0–12)
Platelet Count, POC: 307 10*3/uL (ref 142–424)
RBC: 4.39 M/uL — AB (ref 4.04–5.48)
RDW, POC: 13.8 %
WBC: 7.8 10*3/uL (ref 4.6–10.2)

## 2018-06-26 LAB — CBC WITH DIFFERENTIAL/PLATELET
Basophils Absolute: 0.1 10*3/uL (ref 0.0–0.2)
Basos: 1 %
EOS (ABSOLUTE): 0.2 10*3/uL (ref 0.0–0.4)
Eos: 2 %
Hematocrit: 38 % (ref 34.0–46.6)
Hemoglobin: 12.7 g/dL (ref 11.1–15.9)
Immature Grans (Abs): 0 10*3/uL (ref 0.0–0.1)
Immature Granulocytes: 0 %
Lymphocytes Absolute: 2.6 10*3/uL (ref 0.7–3.1)
Lymphs: 33 %
MCH: 29.7 pg (ref 26.6–33.0)
MCHC: 33.4 g/dL (ref 31.5–35.7)
MCV: 89 fL (ref 79–97)
Monocytes Absolute: 0.6 10*3/uL (ref 0.1–0.9)
Monocytes: 7 %
Neutrophils Absolute: 4.5 10*3/uL (ref 1.4–7.0)
Neutrophils: 57 %
Platelets: 293 10*3/uL (ref 150–450)
RBC: 4.28 x10E6/uL (ref 3.77–5.28)
RDW: 12.9 % (ref 11.7–15.4)
WBC: 7.9 10*3/uL (ref 3.4–10.8)

## 2018-06-26 NOTE — Patient Instructions (Addendum)
Weight is stable from last visit.    I will check thyroid and kidney function tests. Continue to avoid NSAIDS, and make sure you stay sufficiently hydrated.   Blood count has improved.  I will schedule an ultrasound in a few weeks for the lymph node in the neck.   Return to the clinic or go to the nearest emergency room if any of your symptoms worsen or new symptoms occur.   If you have lab work done today you will be contacted with your lab results within the next 2 weeks.  If you have not heard from [place] then please contact us. The fastest way to get your results is to register for My Chart.   IF you received an x-ray today, you will receive an invoice from [facility]. Please contact [facility] at [phone] with questions or concerns regarding your invoice.   IF you received labwork today, you will receive an invoice from [facility]. Please contact [facility] at [phone] with questions or concerns regarding your invoice.   Our billing staff will not be able to assist you with questions regarding bills from these companies.  You will be contacted with the lab results as soon as they are available. The fastest way to get your results is to activate your My Chart account. Instructions are located on the last page of this paperwork. If you have not heard from [place] regarding the results in 2 weeks, please contact this office.

## 2018-06-26 NOTE — Progress Notes (Signed)
By signing my name below, I,Temidayo Atanda-Ogunleye, attest that this documentation has been prepared under the direction and in the presence of Wendie Agreste, MD. Electronically Signed: Stephania Fragmin, Scribe 06/26/2018 at 10:56 AM.  Subjective:  Patient ID: Valerie [name], female    DOB: 1961/04/06  Age: 58 y.o. MRN: [id number]  CC:  Chief Complaint  Patient presents with  . Abnormal Law Review    follow-up   . Thyroid Problem   HPI Valerie [name] is a 58 y.o. female that presents for follow up of labs and thyroid concerns.   She was most recently seen 06/03/2018 by Dr. Brigitte Pulse with multiple concerns. At that time, she was experiencing swelling on the right side of her neck.  Lab Results  Component Value Date   TSH 2.660 02/09/2018   TSH has also been normal in 07/2016 and 11/2016 in the 1.7 and 1.5 range, respectively.  She had a Thyroid US on 06/14/2018 for thyroid enlargement on exam. [place] noted heterogeneity with scattered bilateral sub-centimeter cystic nodules all measuring 22m or less. No significant nodule or recommendation for follow up/biopsy. Left cervical lymph node measuring 11 x 10 x 7 mm.  On 06/20/2018 Dr. SArdyth Galnote reports that she had the flu 10-14 days prior. CBC on 06/03/2018 (see below).  CBC Latest Ref Rng & Units 06/03/2018  WBC 4.6 - 10.2 K/uL 10.3  Hemoglobin 11 - 14.6 g/dL 17.0(H)  Hematocrit 29 - 41 % 51.7(H)  Platelets 150 - 450 x10E3/uL 394   Plan for repeat CBC in 2 weeks and repeat [place] 4 weeks based on 06/20/2018 phone call. Email noted 06/23/2018 plan for CBC and repeat renal function. Telephone note on 06/24/2018 (reviewed 06/25/2018) from Dr. SPamella Pertplan for recheck in 2 weeks to evaluate lymph node. Lab Results  Component Value Date   CREATININE 1.18 (H) 06/03/2018  Patient reports left sided tenderness in her neck.   She is avoiding NSAID and has been for years. Reports that her kidney function tests have been off since  she went into acute kidney failure a couple of years ago.  Blood counts today are normal (see below). Her WBC has improved and her HGB and HCT are normal. Lab Results  Component Value Date   WBC 7.8 06/26/2018   HGB 13.0 06/26/2018   HCT 39.2 06/26/2018   MCV 89.4 06/26/2018   PLT 394 06/03/2018     Past Medical History:  Diagnosis Date  . Acute ischemic colitis ([place]   . Anemia 05/01/2012  . Anxiety   . ARF (acute renal failure) ([place] 12/28/2015  . Arthritis    "basically ~ q joint" (12/28/2015)  . Asthma   . Basal cell carcinoma of upper back ~ 2011  . Chronic right-sided headaches    and neck pain, relieved wiht PT and chronic practic manipulation  . Complete tear of right rotator cuff, recurrent 04/09/2018  . DDD (degenerative disc disease), lumbar   . Diarrhea    EPISODIC  . Fatigue    h/o excessive sedation, etiology unknown  . Fibromyalgia   . First degree heart block   . GERD (gastroesophageal reflux disease) 04/06/2017  . Headache(784.0)    "muscular w/migraine intensity; ~ 5-6 months" (12/28/2015)  . History of ischemic colitis 09/24/2016  . Hypertension   . Mental disorder    reports PTSD  . Microscopic polyangiitis ([place]   . Nerve pain 2011   nerve damage to right arm; normal EMG/NCV to RUExt after shoulder surgery  12/2009  . Other bursal cyst, right shoulder 04/09/2018  . Pancreatitis    due to Effexor  . Pneumonia ~ 2014; 08/2015   Aspiration  . Polymyalgia rheumatica ([place]) onset 08/2015   changed diagnosis to MPA  . Precancerous melanosis ([place])    "2nd toe left foot; 5th toe of right foot; right temple; had them all cut off"  . Sleep apnea    CPAP-9    Diet Order    None      History Patient Active Problem List   Diagnosis Date Noted  . HTN (hypertension) 04/12/2018  . Chronic diastolic CHF (congestive heart failure) ([place]) 04/12/2018  . Shortness of breath   . Other bursal cyst, right shoulder, AC joint into trapezius 04/09/2018  . Persistent  hypersomnia 10/11/2017  . HLA B27 (HLA B27 positive) 10/11/2017  . 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 Generalized edema 09/20/2017  . Abdominal pain, chronic, generalized 09/20/2017  . Anxiety state 09/20/2017  . Chronic, continuous use of opioids 04/06/2017  . GERD (gastroesophageal reflux disease) 04/06/2017  . Cortisol deficiency ([place]) 01/31/2017  . Multiple stiff joints 01/31/2017  . Glucocorticoids and synthetic analogues causing adverse effect in therapeutic use 01/14/2017  . Hypothalamic-adrenal dysfunction ([place]) 01/14/2017  . Microscopic polyangiitis ([place]) 11/25/2016  . Neuropathic pain 11/23/2016  . P-ANCA and MPO antibodies positive 10/11/2016  . Impaired glucose tolerance 09/25/2016  . Lower GI bleed   . History of ischemic colitis 09/24/2016  . Colitis, regional, with rectal bleeding ([place]) 09/24/2016  . Gastroparesis 09/18/2016  . Flank pain 09/18/2016  . Nausea without vomiting 09/18/2016  . CKD (chronic kidney disease) stage 3, GFR 30-59 ml/min (HCC)   . Obesity, morbid ([place]) 10/21/2012  . OSA on CPAP 10/21/2012  . Fibromyalgia 06/22/2012  . Back pain 06/22/2012  . Recurrent pancreatitis 06/22/2012  . Depression 06/22/2012  . Anemia 05/01/2012  . Leukocytosis 04/28/2012  . Colon polyp 08/01/2011  . PTSD (post-traumatic stress disorder) 08/01/2011  . Hypertension 08/01/2011  . IBS (irritable bowel syndrome) 08/01/2011   Past Medical History:  Diagnosis Date  . Acute ischemic colitis ([place])   . Anemia 05/01/2012  . Anxiety   . ARF (acute renal failure) ([place]) 12/28/2015  . Arthritis    "basically ~ q joint" (12/28/2015)  . Asthma   . Basal cell carcinoma of upper back ~ 2011  . Chronic right-sided headaches    and neck pain, relieved wiht PT and chronic practic manipulation  . Complete tear of right rotator cuff, recurrent 04/09/2018  . DDD (degenerative disc disease), lumbar   . Diarrhea    EPISODIC  . Fatigue     h/o excessive sedation, etiology unknown  . Fibromyalgia   . First degree heart block   . GERD (gastroesophageal reflux disease) 04/06/2017  . Headache(784.0)    "muscular w/migraine intensity; ~ 5-6 months" (12/28/2015)  . History of ischemic colitis 09/24/2016  . Hypertension   . Mental disorder    reports PTSD  . Microscopic polyangiitis ([place])   . Nerve pain 2011   nerve damage to right arm; normal EMG/NCV to RUExt after shoulder surgery 12/2009  . Other bursal cyst, right shoulder 04/09/2018  . Pancreatitis    due to Effexor  . Pneumonia ~ 2014; 08/2015   Aspiration  . Polymyalgia rheumatica ([place]) onset 08/2015   changed diagnosis to MPA  . Precancerous melanosis ([place])    "2nd toe left foot; 5th toe of right foot; right temple; had  them all cut off"  . Sleep apnea    CPAP-9   Past Surgical History:  Procedure Laterality Date  . BASAL CELL CARCINOMA EXCISION  ~ 2011   back  . BOTOX INJECTION N/A 11/06/2012   Procedure: BOTOX INJECTION;  Surgeon: Missy Sabins, MD;  [facility]: [facility];  Service: Endoscopy;  Laterality: N/A;  . BOTOX INJECTION N/A 12/04/2013   Procedure: BOTOX INJECTION;  Surgeon: Missy Sabins, MD;  Location: [facility];  Service: Endoscopy;  Laterality: N/A;  . BOTOX INJECTION N/A 02/26/2014   Procedure: BOTOX INJECTION;  Surgeon: Missy Sabins, MD;  Location: [facility];  Service: Endoscopy;  Laterality: N/A;  . BOTOX INJECTION N/A 07/08/2015   Procedure: BOTOX INJECTION;  Surgeon: Teena Irani, MD;  [facility];  Service: Endoscopy;  Laterality: N/A;  . BOTOX INJECTION N/A 04/27/2016   Procedure: BOTOX INJECTION;  Surgeon: Teena Irani, MD;  [facility];  Service: Endoscopy;  Laterality: N/A;  . BOTOX INJECTION N/A 04/09/2017   Procedure: BOTOX INJECTION;  Surgeon: Clarene Essex, MD;  Location: [facility];  Service: Endoscopy;  Laterality: N/A;  . ESOPHAGOGASTRODUODENOSCOPY  12/21/2011   Procedure: ESOPHAGOGASTRODUODENOSCOPY (EGD);   Surgeon: Missy Sabins, MD;  [facility];  Service: Endoscopy;  Laterality: N/A;  . ESOPHAGOGASTRODUODENOSCOPY N/A 11/06/2012   Procedure: ESOPHAGOGASTRODUODENOSCOPY (EGD);  Surgeon: Missy Sabins, MD;  Location: [facility];  Service: Endoscopy;  Laterality: N/A;  . ESOPHAGOGASTRODUODENOSCOPY N/A 12/04/2013   Procedure: ESOPHAGOGASTRODUODENOSCOPY (EGD);  Surgeon: Missy Sabins, MD;  Location: [facility] ENDOSCOPY;  Service: Endoscopy;  Laterality: N/A;  . ESOPHAGOGASTRODUODENOSCOPY N/A 02/26/2014   Procedure: ESOPHAGOGASTRODUODENOSCOPY (EGD);  Surgeon: Missy Sabins, MD;  Location: [facility] ENDOSCOPY;  Service: Endoscopy;  Laterality: N/A;  . ESOPHAGOGASTRODUODENOSCOPY N/A 04/09/2017   Procedure: ESOPHAGOGASTRODUODENOSCOPY (EGD);  Surgeon: Clarene Essex, MD;  Location: [place];  Service: Endoscopy;  Laterality: N/A;  . ESOPHAGOGASTRODUODENOSCOPY (EGD) WITH PROPOFOL N/A 07/08/2015   Procedure: ESOPHAGOGASTRODUODENOSCOPY (EGD) WITH PROPOFOL;  Surgeon: Teena Irani, MD;  [facility];  Service: Endoscopy;  Laterality: N/A;  . ESOPHAGOGASTRODUODENOSCOPY (EGD) WITH PROPOFOL N/A 04/27/2016   Procedure: ESOPHAGOGASTRODUODENOSCOPY (EGD) WITH PROPOFOL;  Surgeon: Teena Irani, MD;  [facility];  Service: Endoscopy;  Laterality: N/A;  . EXCISION MASS UPPER EXTREMETIES Right 04/09/2018   Procedure: EXCISION RIGHT SHOULDER CYST;  Surgeon: Marchia Bond, MD;  [facility];  Service: Orthopedics;  Laterality: Right;  Interscalene Block  . FLEXIBLE SIGMOIDOSCOPY  05/01/2012   Procedure: FLEXIBLE SIGMOIDOSCOPY;  Surgeon: Jeryl Columbia, MD;  [facility];  Service: Endoscopy;  Laterality: N/A;  unprepped  . FLEXIBLE SIGMOIDOSCOPY Left 09/28/2016   Procedure: FLEXIBLE SIGMOIDOSCOPY;  Surgeon: Wilford Corner, MD;  [facility];  Service: Endoscopy;  Laterality: Left;  . LAPAROSCOPIC CHOLECYSTECTOMY  01/2002  . NASAL SEPTOPLASTY W/ TURBINOPLASTY  1998  . SHOULDER ARTHROSCOPY WITH  ROTATOR CUFF REPAIR Right 04/09/2018   Procedure: RIGHT SHOULDER ARTHROSCOPY WITH EXTENSIVE DEBRIDEMENT;  Surgeon: Marchia Bond, MD;  [facility];  Service: Orthopedics;  Laterality: Right;  Interscalene Block  . SHOULDER ARTHROSCOPY WITH ROTATOR CUFF REPAIR AND OPEN BICEPS TENODESIS Bilateral 2010-2013   left-right   Allergies  Allergen Reactions  . Dilaudid [Hydromorphone Hcl] Other (See Comments)    Headache   . Effexor [Venlafaxine]     pancreatitis  . Pregabalin Other (See Comments)    Drug induced stupor  . Adhesive [Tape]     Paper tape causes blood red marks  . Metoclopramide Hcl Other (See Comments)    "  crawling inside"  . Neurontin [Gabapentin] Other (See Comments)    Tremors   . Other Other (See Comments)    Avoid all SSRI's and SNRI's- damaged pancreas  . Serotonin Reuptake Inhibitors (Ssris) Other (See Comments)    Damage to pancreas and bowel/urinary incontinence    Prior to Admission medications   Medication Sig Start Date End Date Taking? Authorizing Provider  albuterol (PROAIR HFA) 108 (90 Base) MCG/ACT inhaler Inhale 2 puffs into the lungs every 4 (four) hours as needed for wheezing or shortness of breath. 04/24/18  Yes Shawnee Knapp, MD  allopurinol (ZYLOPRIM) 300 MG tablet TAKE 1/2 TABLET BY MOUTH ONCE A DAY 04/29/18  Yes Shawnee Knapp, MD  ALPRAZolam Duanne Moron) 1 MG tablet TAKE ONE TABLET BY MOUTH THREE TIMES A DAY AS NEEDED FOR ANXIETY Patient taking differently: 1 mg 3 (three) times daily as needed for anxiety.  02/22/18  Yes Shawnee Knapp, MD  baclofen (LIORESAL) 10 MG tablet Take 10 mg by mouth 3 (three) times daily as needed for muscle spasms.    Yes [provider]  benazepril (LOTENSIN) 20 MG tablet Take 1 tablet (20 mg total) daily by mouth. 04/23/17  Yes Shawnee Knapp, MD  Calcium Carb-Cholecalciferol (CALCIUM 600 + D PO) Take 0.5 tablets by mouth daily.   Yes [provider]  CARAFATE 1 GM/10ML suspension TAKE 10 ML'S BY MOUTH FOUR TIMES A  DAY AS NEEDED FOR STOMACH PAIN 05/06/18  Yes Shawnee Knapp, MD  cetirizine (ZYRTEC) 10 MG tablet Take 10 mg by mouth daily.    Yes [provider]  Cholecalciferol (VITAMIN D3) 5000 units CAPS Take 5,000 Units by mouth daily.    Yes [provider]  dexlansoprazole (DEXILANT) 60 MG capsule Take 60 mg by mouth 2 (two) times daily.   Yes [provider]  fexofenadine (ALLEGRA) 180 MG tablet Take 180 mg by mouth daily. Switches between Allegra and Zyrtec as needed for allergies   Yes [provider]  fluticasone (FLONASE) 50 MCG/ACT nasal spray PLACE 2 SPRAYS INTO BOTH NOSTRILS DAILY Patient taking differently: Place 2 sprays into both nostrils at bedtime.  11/28/17  Yes Shawnee Knapp, MD  HYDROcodone-acetaminophen [facility]) 10-325 MG per tablet Take 1-2 tablets by mouth every 6 (six) hours as needed for moderate pain (pain.).    Yes [provider]  hydrOXYzine (ATARAX/VISTARIL) 10 MG tablet Take 1-3 tablets (10-30 mg total) by mouth every 6 (six) hours as needed for anxiety. Patient taking differently: Take 10-30 mg by mouth 3 (three) times daily as needed for anxiety.  02/22/18  Yes Shawnee Knapp, MD  ibuprofen (ADVIL,MOTRIN) 600 MG tablet Take 600 mg by mouth daily as needed (migraines).   Yes [provider]  ipratropium (ATROVENT) 0.03 % nasal spray Place 2 sprays 4 (four) times daily into the nose. 04/23/17  Yes Shawnee Knapp, MD  Melatonin 5 MG TABS Take 5-10 mg by mouth at bedtime as needed (sleep).   Yes [provider]  metoprolol succinate (TOPROL-XL) 100 MG 24 hr tablet Take 1 tablet (100 mg total) by mouth daily. Take with or immediately following a meal. 07/09/17  Yes Shawnee Knapp, MD  modafinil (PROVIGIL) 200 MG tablet TAKE 1/2 TABLET BY MOUTH TWO TIMES A DAY 05/13/18  Yes Dohmeier, Asencion Partridge, MD  montelukast (SINGULAIR) 10 MG tablet TAKE ONE TABLET BY MOUTH EVERY NIGHT AT BEDTIME 06/06/18  Yes Shawnee Knapp, MD  morphine (MS CONTIN) 30 MG 12  hr  tablet Take 30 mg by mouth every 8 (eight) hours. Not from [place] 09/23/12  Yes [provider]  Multiple Vitamins-Minerals (MULTIVITAMIN PO) Take 1 tablet by mouth daily.   Yes [provider]  Olopatadine HCl (PAZEO) 0.7 % SOLN Apply 1 drop to eye daily. Patient taking differently: Apply 1 drop to eye daily as needed (allergies).  07/09/17  Yes Shawnee Knapp, MD  ondansetron (ZOFRAN) 4 MG tablet Take 1 tablet (4 mg total) by mouth every 8 (eight) hours as needed for nausea or vomiting. 11/18/16  Yes Tereasa Coop, PA-C  Pancrelipase, Lip-Prot-Amyl, (CREON) 24000 UNITS CPEP Take 72,000 Units by mouth 3 (three) times daily as needed (for pancreatitis).    Yes [provider]  promethazine (PHENERGAN) 25 MG tablet Take 1 tablet (25 mg total) by mouth every 6 (six) hours as needed for nausea or vomiting. 11/18/16  Yes Tereasa Coop, PA-C  spironolactone (ALDACTONE) 100 MG tablet Take 1 tablet (100 mg total) by mouth daily. 12/29/17  Yes Shawnee Knapp, MD  torsemide (DEMADEX) 10 MG tablet Take 1 tablet (10 mg total) by mouth daily as needed (edema). 04/13/18  Yes Eugenie Filler, MD   Family History  Problem Relation Age of Onset  . Heart disease Mother   . COPD Mother   . Arthritis Mother   . Cancer Mother   . Hypertension Mother   . Arthritis Paternal Grandmother   . Heart disease Paternal Grandmother   . Heart disease Paternal Grandfather    Social History   Socioeconomic History  . Marital status: Single    Spouse name: Not on file  . Number of children: 0  . Years of education: College  . Highest education level: Not on file  Occupational History  . Occupation: disabled  Social Needs  . Financial resource strain: Not on file  . Food insecurity:    Worry: Not on file    Inability: Not on file  . Transportation needs:    Medical: Not on file    Non-medical: Not on file  Tobacco Use  . Smoking status: Never Smoker  . Smokeless tobacco: Never Used    Substance and Sexual Activity  . Alcohol use: No  . Drug use: No  . Sexual activity: Not Currently  Lifestyle  . Physical activity:    Days per week: Not on file    Minutes per session: Not on file  . Stress: Not on file  Relationships  . Social connections:    Talks on phone: Not on file    Gets together: Not on file    Attends religious service: Not on file    Active member of club or organization: Not on file    Attends meetings of clubs or organizations: Not on file    Relationship status: Not on file  . Intimate partner violence:    Fear of current or ex partner: Not on file    Emotionally abused: Not on file    Physically abused: Not on file    Forced sexual activity: Not on file  Other Topics Concern  . Not on file  Social History Narrative   Caffeine 2 cups iced tea daily avg.    Review of Symptoms See HPI  Objective:  BP 133/84   Pulse 82   Temp 98 F (36.7 C) (Oral)   Resp 16   Wt (!) 302 lb (137 kg)   SpO2 95%   BMI 50.26 kg/m  Wt Readings from Last 3 Encounters:  06/26/18 (!) 302 lb (137 kg)  06/03/18 (!) 301 lb (136.5 kg)  05/08/18 (!) 301 lb 9.6 oz (136.8 kg)    Physical Exam Constitutional:      General: She is not in acute distress.    Appearance: Normal appearance.  HENT:     Head: Normocephalic and atraumatic.  Neck:     Thyroid: No thyroid mass or thyromegaly.     Comments: Describes tenderness of the left cervical chain but I do not note any cervical lymphadenopathy. Pulmonary:     Effort: Pulmonary effort is normal. No respiratory distress.     Breath sounds: Normal breath sounds. No wheezing, rhonchi or rales.  Musculoskeletal:     Right lower leg: No edema.     Left lower leg: No edema.  Lymphadenopathy:     Cervical: No cervical adenopathy.  Neurological:     Mental Status: She is alert and oriented to person, place, and time.  Psychiatric:        Mood and Affect: Mood normal.        Behavior: Behavior normal.         Thought Content: Thought content normal.    Results for orders placed or performed in visit on 06/26/18  POCT CBC  Result Value Ref Range   WBC 7.8 4.6 - 10.2 K/uL   Lymph, poc 2.6 0.6 - 3.4   POC LYMPH PERCENT 33.5 10 - 50 %L   MID (cbc) 0.3 0 - 0.9   POC MID % 3.8 0 - 12 %M   POC Granulocyte 4.9 (A) 2 - 6.9   Granulocyte percent 62.7 37 - 80 %G   RBC 4.39 (A) 4.04 - 5.48 M/uL   Hemoglobin 13.0 11 - 14.6 g/dL   HCT, POC 39.2 29 - 41 %   MCV 89.4 76 - 111 fL   MCH, POC 29.7 27 - 31.2 pg   MCHC 33.3 31.8 - 35.4 g/dL   RDW, POC 13.8 %   Platelet Count, POC 307 142 - 424 K/uL   MPV 8.1 0 - 99.8 fL   Assessment & Plan:  Valerie [name] is a 58 y.o. female Multiple thyroid nodules - Plan: TSH + free T4, US THYROID, CANCELED: US Soft Tissue Head/Neck Lymphadenopathy, cervical - Plan: US THYROID, CANCELED: US Soft Tissue Head/Neck  -No apparent lymphadenopathy appreciated on exam.  Will repeat ultrasound of thyroid in a few weeks to see if previous area has improved, possibly reactive.  Also will repeat thyroid testing.  Polycythemia - Plan: POCT CBC, CBC with Differential/Platelet  -Hemoglobin now improved.  CKD (chronic kidney disease) stage 3, GFR 30-59 ml/min (HCC) - Plan: Comprehensive metabolic panel  -Continue to avoid NSAIDs, maintain hydration.   No orders of the defined types were placed in this encounter.  Patient Instructions   Weight is stable from last visit.    I will check thyroid and kidney function tests. Continue to avoid NSAIDS, and make sure you stay sufficiently hydrated.   Blood count has improved.  I will schedule an ultrasound in a few weeks for the lymph node in the neck.   Return to the clinic or go to the nearest emergency room if any of your symptoms worsen or new symptoms occur.   If you have lab work done today you will be contacted with your lab results within the next 2 weeks.  If you have not heard from [place] then please contact us.  The  fastest way to get your results is to register for My Chart.   IF you received an x-ray today, you will receive an invoice from [facility]. Please contact [facility] at [phone] with questions or concerns regarding your invoice.   IF you received labwork today, you will receive an invoice from [facility]. Please contact [facility] at [phone] with questions or concerns regarding your invoice.   Our billing staff will not be able to assist you with questions regarding bills from these companies.  You will be contacted with the lab results as soon as they are available. The fastest way to get your results is to activate your My Chart account. Instructions are located on the last page of this paperwork. If you have not heard from [place] regarding the results in 2 weeks, please contact this office.       I personally performed the services described in this documentation, which was scribed in my presence. The recorded information has been reviewed and considered for accuracy and completeness, addended by me as needed, and agree with information above.  Signed,   Merri Ray, MD [facility] at [place].  06/30/18 8:43 AM

## 2018-06-27 LAB — TSH+FREE T4
Free T4: 1.14 ng/dL (ref 0.82–1.77)
TSH: 1.8 u[IU]/mL (ref 0.450–4.500)

## 2018-07-04 ENCOUNTER — Ambulatory Visit: Payer: Medicare Other | Admitting: Family Medicine

## 2018-07-16 ENCOUNTER — Telehealth: Payer: Self-pay | Admitting: Family Medicine

## 2018-07-16 NOTE — Telephone Encounter (Signed)
Left a VM in regards to her appt with Dr. Brigitte Pulse on 07/23/2018 and 08/06/2018. The provider will be out of the office on that day and will need to be rescheduled. Told patient in the message that I can put her in with Dr. Pamella Pert sooner if she would like and to ask for [facility] when she called back.

## 2018-07-17 DIAGNOSIS — Z9889 Other specified postprocedural states: Secondary | ICD-10-CM | POA: Diagnosis not present

## 2018-07-17 DIAGNOSIS — K559 Vascular disorder of intestine, unspecified: Secondary | ICD-10-CM | POA: Diagnosis not present

## 2018-07-17 DIAGNOSIS — M79605 Pain in left leg: Secondary | ICD-10-CM | POA: Diagnosis not present

## 2018-07-17 DIAGNOSIS — M79601 Pain in right arm: Secondary | ICD-10-CM | POA: Diagnosis not present

## 2018-07-17 DIAGNOSIS — Z8639 Personal history of other endocrine, nutritional and metabolic disease: Secondary | ICD-10-CM | POA: Diagnosis not present

## 2018-07-17 DIAGNOSIS — K219 Gastro-esophageal reflux disease without esophagitis: Secondary | ICD-10-CM | POA: Diagnosis not present

## 2018-07-17 DIAGNOSIS — G4733 Obstructive sleep apnea (adult) (pediatric): Secondary | ICD-10-CM | POA: Diagnosis not present

## 2018-07-17 DIAGNOSIS — M79604 Pain in right leg: Secondary | ICD-10-CM | POA: Diagnosis not present

## 2018-07-17 DIAGNOSIS — F4312 Post-traumatic stress disorder, chronic: Secondary | ICD-10-CM | POA: Diagnosis not present

## 2018-07-17 DIAGNOSIS — F321 Major depressive disorder, single episode, moderate: Secondary | ICD-10-CM | POA: Diagnosis not present

## 2018-07-17 DIAGNOSIS — M459 Ankylosing spondylitis of unspecified sites in spine: Secondary | ICD-10-CM | POA: Diagnosis not present

## 2018-07-17 DIAGNOSIS — F3289 Other specified depressive episodes: Secondary | ICD-10-CM | POA: Diagnosis not present

## 2018-07-17 DIAGNOSIS — Z9181 History of falling: Secondary | ICD-10-CM | POA: Diagnosis not present

## 2018-07-17 DIAGNOSIS — E669 Obesity, unspecified: Secondary | ICD-10-CM | POA: Diagnosis not present

## 2018-07-17 DIAGNOSIS — Z8709 Personal history of other diseases of the respiratory system: Secondary | ICD-10-CM | POA: Diagnosis not present

## 2018-07-17 DIAGNOSIS — M545 Low back pain: Secondary | ICD-10-CM | POA: Diagnosis not present

## 2018-07-17 DIAGNOSIS — K30 Functional dyspepsia: Secondary | ICD-10-CM | POA: Diagnosis not present

## 2018-07-17 DIAGNOSIS — Z8669 Personal history of other diseases of the nervous system and sense organs: Secondary | ICD-10-CM | POA: Diagnosis not present

## 2018-07-17 DIAGNOSIS — Z9989 Dependence on other enabling machines and devices: Secondary | ICD-10-CM | POA: Diagnosis not present

## 2018-07-17 DIAGNOSIS — Z87448 Personal history of other diseases of urinary system: Secondary | ICD-10-CM | POA: Diagnosis not present

## 2018-07-17 DIAGNOSIS — M317 Microscopic polyangiitis: Secondary | ICD-10-CM | POA: Diagnosis not present

## 2018-07-17 DIAGNOSIS — Z79891 Long term (current) use of opiate analgesic: Secondary | ICD-10-CM | POA: Diagnosis not present

## 2018-07-17 DIAGNOSIS — M533 Sacrococcygeal disorders, not elsewhere classified: Secondary | ICD-10-CM | POA: Diagnosis not present

## 2018-07-23 ENCOUNTER — Ambulatory Visit: Payer: Medicare Other | Admitting: Family Medicine

## 2018-07-23 DIAGNOSIS — S46911S Strain of unspecified muscle, fascia and tendon at shoulder and upper arm level, right arm, sequela: Secondary | ICD-10-CM | POA: Diagnosis not present

## 2018-07-29 ENCOUNTER — Ambulatory Visit
Admission: RE | Admit: 2018-07-29 | Discharge: 2018-07-29 | Disposition: A | Discharge status: Home / Self Care | Payer: Medicare Other | Source: Ambulatory Visit | Attending: Family Medicine | Admitting: Family Medicine

## 2018-07-29 DIAGNOSIS — E042 Nontoxic multinodular goiter: Secondary | ICD-10-CM

## 2018-07-29 DIAGNOSIS — R590 Localized enlarged lymph nodes: Secondary | ICD-10-CM

## 2018-07-31 DIAGNOSIS — S46911S Strain of unspecified muscle, fascia and tendon at shoulder and upper arm level, right arm, sequela: Secondary | ICD-10-CM | POA: Diagnosis not present

## 2018-08-02 ENCOUNTER — Other Ambulatory Visit: Payer: Self-pay | Admitting: Family Medicine

## 2018-08-02 DIAGNOSIS — E790 Hyperuricemia without signs of inflammatory arthritis and tophaceous disease: Secondary | ICD-10-CM

## 2018-08-05 NOTE — Telephone Encounter (Signed)
Requested Prescriptions  Pending Prescriptions Disp Refills  . allopurinol (ZYLOPRIM) 300 MG tablet [Pharmacy Med Name: ALLOPURINOL 300 MG TABLET] 45 tablet 0    Sig: TAKE 1/2 TABLET BY MOUTH ONCE A DAY     Endocrinology:  Gout Agents Failed - 08/02/2018  9:45 PM      Failed - Cr in normal range and within 360 days    Creat  Date Value Ref Range Status  04/14/2016 1.07 (H) 0.50 - 1.05 mg/dL Final    Comment:      For patients > or = 57 years of age: The upper reference limit for Creatinine is approximately 13% higher for people identified as African-American.      Creatinine, Ser  Date Value Ref Range Status  06/26/2018 1.24 (H) 0.57 - 1.00 mg/dL Final         Passed - Uric Acid in normal range and within 360 days    Uric Acid  Date Value Ref Range Status  12/03/2017 6.9 2.5 - 7.1 mg/dL Final    Comment:               Therapeutic target for gout patients: <6.0         Passed - Valid encounter within last 12 months    Recent Outpatient Visits          1 month ago Multiple thyroid nodules   [facility], Ranell Patrick, MD   2 months ago Urinary frequency   [facility], Laurey Arrow, MD   2 months ago Urinary frequency   [facility] at [facility], Fenton Malling, MD   3 months ago Essential hypertension   [facility], Laurey Arrow, MD   4 months ago Chronic right shoulder pain   [facility], Lilia Argue, MD      Future Appointments            In 3 days Shawnee Knapp, MD [facility] at [place], [facility]         . benazepril (LOTENSIN) 20 MG tablet [Pharmacy Med Name: BENAZEPRIL HCL 20 MG TABLET] 90 tablet 2    Sig: TAKE ONE TABLET BY MOUTH DAILY     Cardiovascular:  ACE Inhibitors Failed - 08/02/2018  9:45 PM      Failed - Cr in normal range and within 180 days    Creat  Date Value Ref Range Status  04/14/2016 1.07 (H) 0.50 - 1.05 mg/dL Final    Comment:      For patients > or = 57 years of age: The upper reference limit  for Creatinine is approximately 13% higher for people identified as African-American.      Creatinine, Ser  Date Value Ref Range Status  06/26/2018 1.24 (H) 0.57 - 1.00 mg/dL Final         Passed - K in normal range and within 180 days    Potassium  Date Value Ref Range Status  06/26/2018 4.6 3.5 - 5.2 mmol/L Final         Passed - Patient is not pregnant      Passed - Last BP in normal range    BP Readings from Last 1 Encounters:  06/26/18 133/84         Passed - Valid encounter within last 6 months    Recent Outpatient Visits          1 month ago Multiple thyroid nodules  [facility] [place], MD   2 months ago Urinary frequency   [facility], Laurey Arrow, MD   2 months ago Urinary frequency   [facility] at [facility], Fenton Malling, MD   3 months ago Essential hypertension   [facility], Laurey Arrow, MD   4 months ago Chronic right shoulder pain   [facility], Lilia Argue, MD      Future Appointments            In 3 days Shawnee Knapp, MD Primary [facility] at [place], [facility]

## 2018-08-06 ENCOUNTER — Ambulatory Visit: Payer: Medicare Other | Admitting: Family Medicine

## 2018-08-08 ENCOUNTER — Ambulatory Visit (INDEPENDENT_AMBULATORY_CARE_PROVIDER_SITE_OTHER): Payer: Medicare Other | Admitting: Family Medicine

## 2018-08-08 ENCOUNTER — Encounter: Payer: Self-pay | Admitting: Family Medicine

## 2018-08-08 ENCOUNTER — Other Ambulatory Visit: Payer: Self-pay

## 2018-08-08 VITALS — BP 138/88 | HR 92 | Temp 98.0°F | Resp 16 | Wt 299.0 lb

## 2018-08-08 DIAGNOSIS — I10 Essential (primary) hypertension: Secondary | ICD-10-CM

## 2018-08-08 DIAGNOSIS — M25541 Pain in joints of right hand: Secondary | ICD-10-CM | POA: Diagnosis not present

## 2018-08-08 DIAGNOSIS — F411 Generalized anxiety disorder: Secondary | ICD-10-CM

## 2018-08-08 DIAGNOSIS — S46911S Strain of unspecified muscle, fascia and tendon at shoulder and upper arm level, right arm, sequela: Secondary | ICD-10-CM | POA: Diagnosis not present

## 2018-08-08 DIAGNOSIS — E790 Hyperuricemia without signs of inflammatory arthritis and tophaceous disease: Secondary | ICD-10-CM | POA: Diagnosis not present

## 2018-08-08 LAB — POCT URINALYSIS DIP (MANUAL ENTRY)
Bilirubin, UA: NEGATIVE
Blood, UA: NEGATIVE
Glucose, UA: NEGATIVE mg/dL
Ketones, POC UA: NEGATIVE mg/dL
Leukocytes, UA: NEGATIVE
Nitrite, UA: NEGATIVE
PROTEIN UA: NEGATIVE mg/dL
Spec Grav, UA: 1.02 (ref 1.010–1.025)
UROBILINOGEN UA: 0.2 U/dL
pH, UA: 5.5 (ref 5.0–8.0)

## 2018-08-08 MED ORDER — PROMETHAZINE HCL 25 MG PO TABS: 25.0000 mg | ORAL_TABLET | Freq: Four times a day (QID) | ORAL | 1 refills | Status: AC | PRN

## 2018-08-08 MED ORDER — BENAZEPRIL HCL 40 MG PO TABS: 40.0000 mg | ORAL_TABLET | Freq: Every day | ORAL | 1 refills | Status: AC

## 2018-08-08 MED ORDER — SPIRONOLACTONE 100 MG PO TABS: 100.0000 mg | ORAL_TABLET | Freq: Every day | ORAL | 1 refills | Status: DC

## 2018-08-08 MED ORDER — ALPRAZOLAM 1 MG PO TABS: 1.0000 mg | ORAL_TABLET | Freq: Three times a day (TID) | ORAL | 1 refills | Status: AC | PRN

## 2018-08-08 MED ORDER — OLOPATADINE HCL 0.7 % OP SOLN: 1.0000 [drp] | Freq: Every day | OPHTHALMIC | 5 refills | Status: AC | PRN

## 2018-08-08 MED ORDER — TORSEMIDE 10 MG PO TABS: 10.0000 mg | ORAL_TABLET | Freq: Every day | ORAL | 0 refills | Status: DC | PRN

## 2018-08-08 MED ORDER — METOPROLOL SUCCINATE ER 100 MG PO TB24: 100.0000 mg | ORAL_TABLET | Freq: Every day | ORAL | 3 refills | Status: AC

## 2018-08-08 MED ORDER — ALLOPURINOL 300 MG PO TABS: ORAL_TABLET | ORAL | 0 refills | Status: AC

## 2018-08-08 MED ORDER — HYDROXYZINE HCL 10 MG PO TABS: 10.0000 mg | ORAL_TABLET | Freq: Three times a day (TID) | ORAL | 1 refills | Status: AC | PRN

## 2018-08-08 NOTE — Progress Notes (Signed)
Subjective:    Patient: Valerie [name]  DOB: 20-May-1961; 58 y.o.   MRN: [id number]  Chief Complaint  Patient presents with  . Chronic Conditions    1 month follow-up     HPI Having more problems with gastroparesis with plans to follow-up with Dr. Watt Climes in next sev wks to repeat botox  Following with [facility] to break up scar tissue on clavicle Rt from surgery that was restricting scar tissues.  Has been taking diuretics every other day.  Occ checking BP outside office and later in the day - will run 120/70s.  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  Using the hydroxyzine usually once a day but sometime twice.  Usually might need 2-3 tabs at night to help sleep.   Occ wheezing in mornings but resolved with albuterol - rarely needed during the day.  Notes BP has been high in the mornings since we decreased her benazepril.   Thyroid cysts and small nodules are all benign and do not need further monitoring - normal lymph nodes.  Right index finger has been hurting and more tender. For sev d she doubled up on the allopurinol and it got a lot better.  Has an appointment with Quintella Baton in April.   Medical History Past Medical History:  Diagnosis Date  . Acute ischemic colitis ([place])   . Anemia 05/01/2012  . Anxiety   . ARF (acute renal failure) ([place]) 12/28/2015  . Arthritis    "basically ~ q joint" (12/28/2015)  . Asthma   . Basal cell carcinoma of upper back ~ 2011  . Chronic right-sided headaches    and neck pain, relieved wiht PT and chronic practic manipulation  . Complete tear of right rotator cuff, recurrent 04/09/2018  . DDD (degenerative disc disease), lumbar   . Diarrhea    EPISODIC  . Fatigue    h/o excessive sedation, etiology unknown  . Fibromyalgia   . First degree heart block   . GERD (gastroesophageal  reflux disease) 04/06/2017  . Headache(784.0)    "muscular w/migraine intensity; ~ 5-6 months" (12/28/2015)  . History of ischemic colitis 09/24/2016  . Hypertension   . Mental disorder    reports PTSD  . Microscopic polyangiitis ([place])   . Nerve pain 2011   nerve damage to right arm; normal EMG/NCV to RUExt after shoulder surgery 12/2009  . Other bursal cyst, right shoulder 04/09/2018  . Pancreatitis    due to Effexor  . Pneumonia ~ 2014; 08/2015   Aspiration  . Polymyalgia rheumatica ([place]) onset 08/2015   changed diagnosis to MPA  . Precancerous melanosis ([place])    "2nd toe left foot; 5th toe of right foot; right temple; had them all cut off"  . Sleep apnea    CPAP-9   Past Surgical History:  Procedure Laterality Date  . BASAL CELL CARCINOMA EXCISION  ~ 2011   back  . BOTOX INJECTION N/A 11/06/2012   Procedure: BOTOX INJECTION;  Surgeon: Missy Sabins, MD;  [facility]: [facility];  Service: Endoscopy;  Laterality: N/A;  . BOTOX INJECTION N/A 12/04/2013   Procedure: BOTOX INJECTION;  Surgeon: Missy Sabins, MD;  Location: [facility];  Service: Endoscopy;  Laterality: N/A;  . BOTOX INJECTION N/A 02/26/2014   Procedure: BOTOX INJECTION;  Surgeon: Missy Sabins, MD;  Location: [facility];  Service: Endoscopy;  Laterality: N/A;  . BOTOX INJECTION N/A 07/08/2015   Procedure: BOTOX INJECTION;  Surgeon: Teena Irani, MD;  [facility];  Service: Endoscopy;  Laterality: N/A;  . BOTOX INJECTION N/A 04/27/2016   Procedure: BOTOX INJECTION;  Surgeon: Teena Irani, MD;  [facility];  Service: Endoscopy;  Laterality: N/A;  . BOTOX INJECTION N/A 04/09/2017   Procedure: BOTOX INJECTION;  Surgeon: Clarene Essex, MD;  Location: [facility];  Service: Endoscopy;  Laterality: N/A;  . ESOPHAGOGASTRODUODENOSCOPY  12/21/2011   Procedure: ESOPHAGOGASTRODUODENOSCOPY (EGD);  Surgeon: Missy Sabins, MD;  Location: [facility] END[facility]COPY;  Service: Endoscopy;  Laterality: N/A;  . ESOPHAGOGASTRODUODENOSCOPY  N/A 11/06/2012   Procedure: ESOPHAGOGASTRODUODENOSCOPY (EGD);  Surgeon: Missy Sabins, MD;  Location: [facility];  Service: Endoscopy;  Laterality: N/A;  . ESOPHAGOGASTRODUODENOSCOPY N/A 12/04/2013   Procedure: ESOPHAGOGASTRODUODENOSCOPY (EGD);  Surgeon: Missy Sabins, MD;  Location: [facility] ENDOSCOPY;  Service: Endoscopy;  Laterality: N/A;  . ESOPHAGOGASTRODUODENOSCOPY N/A 02/26/2014   Procedure: ESOPHAGOGASTRODUODENOSCOPY (EGD);  Surgeon: Missy Sabins, MD;  Location: [facility] END[facility]COPY;  Service: Endoscopy;  Laterality: N/A;  . ESOPHAGOGASTRODUODENOSCOPY N/A 04/09/2017   Procedure: ESOPHAGOGASTRODUODENOSCOPY (EGD);  Surgeon: Clarene Essex, MD;  Location: [facility];  Service: Endoscopy;  Laterality: N/A;  . ESOPHAGOGASTRODUODENOSCOPY (EGD) WITH PROPOFOL N/A 07/08/2015   Procedure: ESOPHAGOGASTRODUODENOSCOPY (EGD) WITH PROPOFOL;  Surgeon: Teena Irani, MD;  [facility];  Service: Endoscopy;  Laterality: N/A;  . ESOPHAGOGASTRODUODENOSCOPY (EGD) WITH PROPOFOL N/A 04/27/2016   Procedure: ESOPHAGOGASTRODUODENOSCOPY (EGD) WITH PROPOFOL;  Surgeon: Teena Irani, MD;  [facility];  Service: Endoscopy;  Laterality: N/A;  . EXCISION MASS UPPER EXTREMETIES Right 04/09/2018   Procedure: EXCISION RIGHT SHOULDER CYST;  Surgeon: Marchia Bond, MD;  [facility];  Service: Orthopedics;  Laterality: Right;  Interscalene Block  . FLEXIBLE SIGMOIDOSCOPY  05/01/2012   Procedure: FLEXIBLE SIGMOIDOSCOPY;  Surgeon: Jeryl Columbia, MD;  [facility];  Service: Endoscopy;  Laterality: N/A;  unprepped  . FLEXIBLE SIGMOIDOSCOPY Left 09/28/2016   Procedure: FLEXIBLE SIGMOIDOSCOPY;  Surgeon: Wilford Corner, MD;  [facility];  Service: Endoscopy;  Laterality: Left;  . LAPAROSCOPIC CHOLECYSTECTOMY  01/2002  . NASAL SEPTOPLASTY W/ TURBINOPLASTY  1998  . SHOULDER ARTHROSCOPY WITH ROTATOR CUFF REPAIR Right 04/09/2018   Procedure: RIGHT SHOULDER ARTHROSCOPY WITH EXTENSIVE DEBRIDEMENT;  Surgeon: Marchia Bond, MD;  [facility];  Service: Orthopedics;  Laterality: Right;  Interscalene Block  . SHOULDER ARTHROSCOPY WITH ROTATOR CUFF REPAIR AND OPEN BICEPS TENODESIS Bilateral 2010-2013   left-right   Current Outpatient Medications on File Prior to Visit  Medication Sig Dispense Refill  . albuterol (PROAIR HFA) 108 (90 Base) MCG/ACT inhaler Inhale 2 puffs into the lungs every 4 (four) hours as needed for wheezing or shortness of breath. 8.5 each 11  . allopurinol (ZYLOPRIM) 300 MG tablet TAKE 1/2 TABLET BY MOUTH ONCE A DAY 45 tablet 0  . ALPRAZolam (XANAX) 1 MG tablet TAKE ONE TABLET BY MOUTH THREE TIMES A DAY AS NEEDED FOR ANXIETY (Patient taking differently: 1 mg 3 (three) times daily as needed for anxiety. ) 90 tablet 1  . baclofen (LIORESAL) 10 MG tablet Take 10 mg by mouth 3 (three) times daily as needed for muscle spasms.     . benazepril (LOTENSIN) 20 MG tablet TAKE ONE TABLET BY MOUTH DAILY 90 tablet 2  . Calcium Carb-Cholecalciferol (CALCIUM 600 + D PO) Take 0.5 tablets by mouth daily.    Marland Kitchen CARAFATE 1 GM/10ML suspension TAKE 10 ML'S BY MOUTH FOUR TIMES A DAY  AS NEEDED FOR STOMACH PAIN 420 mL 1  . cetirizine (ZYRTEC) 10 MG tablet Take 10 mg by mouth daily.     . Cholecalciferol (VITAMIN D3) 5000 units CAPS Take 5,000 Units by mouth daily.     Marland Kitchen dexlansoprazole (DEXILANT) 60 MG capsule Take 60 mg by mouth 2 (two) times daily.    . fexofenadine (ALLEGRA) 180 MG tablet Take 180 mg by mouth daily. Switches between Allegra and Zyrtec as needed for allergies    . fluticasone (FLONASE) 50 MCG/ACT nasal spray PLACE 2 SPRAYS INTO BOTH NOSTRILS DAILY (Patient taking differently: Place 2 sprays into both nostrils at bedtime. ) 48 g 1  . HYDROcodone-acetaminophen (NORCO) 10-325 MG per tablet Take 1-2 tablets by mouth every 6 (six) hours as needed for moderate pain (pain.).     Marland Kitchen hydrOXYzine (ATARAX/VISTARIL) 10 MG tablet Take 1-3 tablets (10-30 mg total) by mouth every 6 (six) hours as needed for  anxiety. (Patient taking differently: Take 10-30 mg by mouth 3 (three) times daily as needed for anxiety. ) 180 tablet 1  . ibuprofen (ADVIL,MOTRIN) 600 MG tablet Take 600 mg by mouth daily as needed (migraines).    Marland Kitchen ipratropium (ATROVENT) 0.03 % nasal spray Place 2 sprays 4 (four) times daily into the nose. 30 mL 1  . Melatonin 5 MG TABS Take 5-10 mg by mouth at bedtime as needed (sleep).    . metoprolol succinate (TOPROL-XL) 100 MG 24 hr tablet Take 1 tablet (100 mg total) by mouth daily. Take with or immediately following a meal. 90 tablet 3  . modafinil (PROVIGIL) 200 MG tablet TAKE 1/2 TABLET BY MOUTH TWO TIMES A DAY 90 tablet 1  . montelukast (SINGULAIR) 10 MG tablet TAKE ONE TABLET BY MOUTH EVERY NIGHT AT BEDTIME 90 tablet 1  . morphine (MS CONTIN) 30 MG 12 hr tablet Take 30 mg by mouth every 8 (eight) hours. Not from [place]    . Multiple Vitamins-Minerals (MULTIVITAMIN PO) Take 1 tablet by mouth daily.    . Olopatadine HCl (PAZEO) 0.7 % SOLN Apply 1 drop to eye daily. (Patient taking differently: Apply 1 drop to eye daily as needed (allergies). ) 2.5 mL 11  . ondansetron (ZOFRAN) 4 MG tablet Take 1 tablet (4 mg total) by mouth every 8 (eight) hours as needed for nausea or vomiting. 20 tablet 0  . Pancrelipase, Lip-Prot-Amyl, (CREON) 24000 UNITS CPEP Take 72,000 Units by mouth 3 (three) times daily as needed (for pancreatitis).     . promethazine (PHENERGAN) 25 MG tablet Take 1 tablet (25 mg total) by mouth every 6 (six) hours as needed for nausea or vomiting. 30 tablet 1  . spironolactone (ALDACTONE) 100 MG tablet Take 1 tablet (100 mg total) by mouth daily. 90 tablet 1  . torsemide (DEMADEX) 10 MG tablet Take 1 tablet (10 mg total) by mouth daily as needed (edema).     No current facility-administered medications on file prior to visit.    Allergies  Allergen Reactions  . Dilaudid [Hydromorphone Hcl] Other (See Comments)    Headache   . Effexor [Venlafaxine]     pancreatitis  .  Pregabalin Other (See Comments)    Drug induced stupor  . Adhesive [Tape]     Paper tape causes blood red marks  . Metoclopramide Hcl Other (See Comments)    "crawling inside"  . Neurontin [Gabapentin] Other (See Comments)    Tremors   . Other Other (See Comments)    Avoid all SSRI's and SNRI's-  damaged pancreas  . Serotonin Reuptake Inhibitors (Ssris) Other (See Comments)    Damage to pancreas and bowel/urinary incontinence    Family History  Problem Relation Age of Onset  . Heart disease Mother   . COPD Mother   . Arthritis Mother   . Cancer Mother   . Hypertension Mother   . Arthritis Paternal Grandmother   . Heart disease Paternal Grandmother   . Heart disease Paternal Grandfather    Social History   Socioeconomic History  . Marital status: Single    Spouse name: Not on file  . Number of children: 0  . Years of education: College  . Highest education level: Not on file  Occupational History  . Occupation: disabled  Social Needs  . Financial resource strain: Not on file  . Food insecurity:    Worry: Not on file    Inability: Not on file  . Transportation needs:    Medical: Not on file    Non-medical: Not on file  Tobacco Use  . Smoking status: Never Smoker  . Smokeless tobacco: Never Used  Substance and Sexual Activity  . Alcohol use: No  . Drug use: No  . Sexual activity: Not Currently  Lifestyle  . Physical activity:    Days per week: Not on file    Minutes per session: Not on file  . Stress: Not on file  Relationships  . Social connections:    Talks on phone: Not on file    Gets together: Not on file    Attends religious service: Not on file    Active member of club or organization: Not on file    Attends meetings of clubs or organizations: Not on file    Relationship status: Not on file  Other Topics Concern  . Not on file  Social History Narrative   Caffeine 2 cups iced tea daily avg.   Depression screen [facility] 2/9 08/08/2018 06/26/2018 06/03/2018  05/08/2018 04/24/2018  Decreased Interest 0 0 0 0 0  Down, Depressed, Hopeless 0 0 0 0 0  PHQ - 2 Score 0 0 0 0 0  Altered sleeping - - - - -  Tired, decreased energy - - - - -  Change in appetite - - - - -  Feeling bad or failure about yourself  - - - - -  Trouble concentrating - - - - -  Moving slowly or fidgety/restless - - - - -  Suicidal thoughts - - - - -  PHQ-9 Score - - - - -  Difficult doing work/chores - - - - -  Some recent data might be hidden    ROS As noted in HPI  Objective:  BP (!) 153/90   Pulse 92   Temp 98 F (36.7 C) (Oral)   Resp 16   Wt 299 lb (135.6 kg)   SpO2 97%   BMI 49.76 kg/m  Physical Exam Constitutional:      General: She is not in acute distress.    Appearance: She is well-developed. She is not diaphoretic.  HENT:     Head: Normocephalic and atraumatic.     Right Ear: External ear normal.     Left Ear: External ear normal.  Eyes:     General: No scleral icterus.    Conjunctiva/sclera: Conjunctivae normal.  Neck:     Musculoskeletal: Normal range of motion and neck supple.     Thyroid: No thyromegaly.  Cardiovascular:     Rate and Rhythm: Normal  rate and regular rhythm.     Heart sounds: Normal heart sounds.  Pulmonary:     Effort: Pulmonary effort is normal. No respiratory distress.     Breath sounds: Normal breath sounds.  Lymphadenopathy:     Cervical: No cervical adenopathy.  Skin:    General: Skin is warm and dry.     Findings: No erythema.  Neurological:     Mental Status: She is alert and oriented to person, place, and time.  Psychiatric:        Behavior: Behavior normal.     [facility] Office Visit on 08/08/2018  Component Date Value Ref Range Status  . Glucose 08/08/2018 162* 65 - 99 mg/dL Final  . BUN 08/08/2018 22  6 - 24 mg/dL Final  . Creatinine, Ser 08/08/2018 1.16* 0.57 - 1.00 mg/dL Final  . GFR calc non Af Amer 08/08/2018 53* >59 mL/min/1.73 Final  . GFR calc Af Amer 08/08/2018 61  >59 mL/min/1.73 Final   . BUN/Creatinine Ratio 08/08/2018 19  9 - 23 Final  . Sodium 08/08/2018 137  134 - 144 mmol/L Final  . Potassium 08/08/2018 4.8  3.5 - 5.2 mmol/L Final  . Chloride 08/08/2018 100  96 - 106 mmol/L Final  . CO2 08/08/2018 20  20 - 29 mmol/L Final  . Calcium 08/08/2018 10.1  8.7 - 10.2 mg/dL Final  . Color, UA 08/08/2018 yellow  yellow Final  . Clarity, UA 08/08/2018 clear  clear Final  . Glucose, UA 08/08/2018 negative  negative mg/dL Final  . Bilirubin, UA 08/08/2018 negative  negative Final  . Ketones, POC UA 08/08/2018 negative  negative mg/dL Final  . Spec Grav, UA 08/08/2018 1.020  1.010 - 1.025 Final  . Blood, UA 08/08/2018 negative  negative Final  . pH, UA 08/08/2018 5.5  5.0 - 8.0 Final  . Protein Ur, POC 08/08/2018 negative  negative mg/dL Final  . Urobilinogen, UA 08/08/2018 0.2  0.2 or 1.0 E.U./dL Final  . Nitrite, UA 08/08/2018 Negative  Negative Final  . Leukocytes, UA 08/08/2018 Negative  Negative Final  . Uric Acid 08/08/2018 6.7  2.5 - 7.1 mg/dL Final              Therapeutic target for gout patients: <6.0  . Sed Rate 08/08/2018 19  0 - 40 mm/hr Final  . CRP 08/08/2018 13* 0 - 10 mg/L Final     Assessment & Plan:   1. Essential hypertension   2. Hyperuricemia   3. Anxiety state   4. Arthralgia of right hand   5. Generalized anxiety disorder      Patient will continue on current chronic medications other than changes noted above, so ok to refill when needed.   See after visit summary for patient specific instructions.  Orders Placed This Encounter  Procedures  . Basic metabolic panel  . Uric Acid  . Sedimentation rate  . C-reactive protein  . POCT urinalysis dipstick    Meds ordered this encounter  Medications  . benazepril (LOTENSIN) 40 MG tablet    Sig: Take 1 tablet (40 mg total) by mouth daily.    Dispense:  90 tablet    Refill:  1  . spironolactone (ALDACTONE) 100 MG tablet    Sig: Take 1 tablet (100 mg total) by mouth daily.    Dispense:   90 tablet    Refill:  1  . torsemide (DEMADEX) 10 MG tablet    Sig: Take 1 tablet (10 mg total) by mouth daily  as needed (edema).    Dispense:  90 tablet    Refill:  0  . allopurinol (ZYLOPRIM) 300 MG tablet    Sig: TAKE 1/2 TABLET BY MOUTH ONCE A DAY    Dispense:  45 tablet    Refill:  0    Pt does not need currently. Please add this on as refills to current rx.  Marland Kitchen ALPRAZolam (XANAX) 1 MG tablet    Sig: Take 1 tablet (1 mg total) by mouth 3 (three) times daily as needed. for anxiety    Dispense:  90 tablet    Refill:  1  . hydrOXYzine (ATARAX/VISTARIL) 10 MG tablet    Sig: Take 1-3 tablets (10-30 mg total) by mouth 3 (three) times daily as needed for anxiety.    Dispense:  180 tablet    Refill:  1    Pt does not need currently. Please add this on as refills to current rx.  . metoprolol succinate (TOPROL-XL) 100 MG 24 hr tablet    Sig: Take 1 tablet (100 mg total) by mouth daily. Take with or immediately following a meal.    Dispense:  90 tablet    Refill:  3  . Olopatadine HCl (PAZEO) 0.7 % SOLN    Sig: Apply 1 drop to eye daily as needed (allergies).    Dispense:  1 Bottle    Refill:  5  . promethazine (PHENERGAN) 25 MG tablet    Sig: Take 1 tablet (25 mg total) by mouth every 6 (six) hours as needed for nausea or vomiting.    Dispense:  30 tablet    Refill:  1    Patient verbalized to me that they understand the following: diagnosis, what is being done for them, what to expect and what should be done at home.  Their questions have been answered. They understand that I am unable to predict every possible medication interaction or adverse outcome and that if any unexpected symptoms arise, they should contact us and their pharmacist, as well as never hesitate to seek urgent/emergent care at [facility] or ER if they think it might be warranted.    Delman Cheadle, MD, MPH [facility] [address] [place], [place]  [address] [phone]  Office phone  [phone] Office fax  08/08/18 8:17 AM

## 2018-08-08 NOTE — Patient Instructions (Addendum)
     If you have lab work done today you will be contacted with your lab results within the next 2 weeks.  If you have not heard from [place] then please contact us. The fastest way to get your results is to register for My Chart.   IF you received an x-ray today, you will receive an invoice from [facility]. Please contact [facility] at [phone] with questions or concerns regarding your invoice.   IF you received labwork today, you will receive an invoice from [facility]. Please contact [facility] at [phone] with questions or concerns regarding your invoice.   Our billing staff will not be able to assist you with questions regarding bills from these companies.  You will be contacted with the lab results as soon as they are available. The fastest way to get your results is to activate your My Chart account. Instructions are located on the last page of this paperwork. If you have not heard from [place] regarding the results in 2 weeks, please contact this office.     Unfortunately, I will no longer be at [facility] after April 2020. I am going to consider starting an independent practice called [facility].  It will be very small with limited part-time services that accepts everyone but mainly ensures that it caters services to the [facility]. For more information as this hopefully comes into fruition, you can keep in contact with [place] at:  [facility] Phone number: [phone] Email: [email or web address]  Temporary address (for over the summer?) We will be borrowing space from: [facility] [address] [place], [place] [address]   In the meantime, consider checking out [name] or Windell Hummingbird at [employer] at [place].

## 2018-08-09 LAB — BASIC METABOLIC PANEL
BUN/Creatinine Ratio: 19 (ref 9–23)
BUN: 22 mg/dL (ref 6–24)
CO2: 20 mmol/L (ref 20–29)
Calcium: 10.1 mg/dL (ref 8.7–10.2)
Chloride: 100 mmol/L (ref 96–106)
Creatinine, Ser: 1.16 mg/dL — ABNORMAL HIGH (ref 0.57–1.00)
GFR calc Af Amer: 61 mL/min/{1.73_m2} (ref >59)
GFR calc non Af Amer: 53 mL/min/{1.73_m2} — ABNORMAL LOW (ref >59)
GLUCOSE: 162 mg/dL — AB (ref 65–99)
POTASSIUM: 4.8 mmol/L (ref 3.5–5.2)
Sodium: 137 mmol/L (ref 134–144)

## 2018-08-09 LAB — C-REACTIVE PROTEIN: CRP: 13 mg/L — ABNORMAL HIGH (ref 0–10)

## 2018-08-09 LAB — URIC ACID: Uric Acid: 6.7 mg/dL (ref 2.5–7.1)

## 2018-08-09 LAB — SEDIMENTATION RATE: Sed Rate: 19 mm/hr (ref 0–40)

## 2018-08-15 ENCOUNTER — Encounter: Payer: Self-pay | Admitting: Family Medicine

## 2018-08-19 DIAGNOSIS — S46911S Strain of unspecified muscle, fascia and tendon at shoulder and upper arm level, right arm, sequela: Secondary | ICD-10-CM | POA: Diagnosis not present

## 2018-09-12 ENCOUNTER — Other Ambulatory Visit: Payer: Self-pay | Admitting: Family Medicine

## 2018-09-12 NOTE — Telephone Encounter (Signed)
Requested medication (s) are due for refill today: Yes  Requested medication (s) are on the active medication list: Yes  Last refill:  04/23/17  Future visit scheduled: No  Notes to clinic:  Prescription has expired.    Requested Prescriptions  Pending Prescriptions Disp Refills   ipratropium (ATROVENT) 0.03 % nasal spray [Pharmacy Med Name: IPRATROPIUM 0.03% SPRAY] 30 mL 0    Sig: PLACE 2 SPRAYS INTO THE AFFECTED NOSTRIL(S) 4 TIMES A DAY     Off-Protocol Failed - 09/12/2018 12:41 PM      Failed - Medication not assigned to a protocol, review manually.      Passed - Valid encounter within last 12 months    Recent Outpatient Visits          1 month ago Essential hypertension   [facility], Laurey Arrow, MD   2 months ago Multiple thyroid nodules   [facility], Ranell Patrick, MD   3 months ago Urinary frequency   [facility], Laurey Arrow, MD   4 months ago Urinary frequency   [facility], Fenton Malling, MD   4 months ago Essential hypertension   [facility], Laurey Arrow, MD           Yes

## 2018-09-18 DIAGNOSIS — F332 Major depressive disorder, recurrent severe without psychotic features: Secondary | ICD-10-CM | POA: Diagnosis not present

## 2018-09-24 ENCOUNTER — Encounter: Payer: Self-pay | Admitting: *Deleted

## 2018-09-24 ENCOUNTER — Telehealth: Payer: Self-pay | Admitting: *Deleted

## 2018-09-24 NOTE — Telephone Encounter (Signed)
Spoke to pt , r/s from MM/NP from maternity leave to AL/NP 10-14-18 at 1030 VV. Due to current COVID 19 pandemic, our office is severely reducing in office visits for at least the next 2 weeks, in order to minimize the risk to our patients and healthcare providers.  Pt understands that although there may be some limitations with this type of visit, we will take all precautions to reduce any security or privacy concerns.  Pt understands that this will be treated like an in office visit and we will file with pt's insurance.  Pt's email is [email or web address]. Pt understands that the cisco webex software must be downloaded and operational on the device pt plans to use for the visit.  Pt consented to VV. Chart updated.

## 2018-09-26 DIAGNOSIS — F332 Major depressive disorder, recurrent severe without psychotic features: Secondary | ICD-10-CM | POA: Diagnosis not present

## 2018-10-02 DIAGNOSIS — F332 Major depressive disorder, recurrent severe without psychotic features: Secondary | ICD-10-CM | POA: Diagnosis not present

## 2018-10-03 DIAGNOSIS — N183 Chronic kidney disease, stage 3 (moderate): Secondary | ICD-10-CM | POA: Diagnosis not present

## 2018-10-03 DIAGNOSIS — I129 Hypertensive chronic kidney disease with stage 1 through stage 4 chronic kidney disease, or unspecified chronic kidney disease: Secondary | ICD-10-CM | POA: Diagnosis not present

## 2018-10-14 ENCOUNTER — Other Ambulatory Visit: Payer: Self-pay

## 2018-10-14 ENCOUNTER — Ambulatory Visit: Payer: Medicare Other | Admitting: Nurse Practitioner

## 2018-10-14 ENCOUNTER — Ambulatory Visit: Payer: Medicare Other | Admitting: Adult Health

## 2018-10-14 ENCOUNTER — Ambulatory Visit (INDEPENDENT_AMBULATORY_CARE_PROVIDER_SITE_OTHER): Payer: Medicare Other | Admitting: Family Medicine

## 2018-10-14 ENCOUNTER — Encounter: Payer: Self-pay | Admitting: Family Medicine

## 2018-10-14 ENCOUNTER — Other Ambulatory Visit: Payer: Self-pay | Admitting: Family Medicine

## 2018-10-14 DIAGNOSIS — G4733 Obstructive sleep apnea (adult) (pediatric): Secondary | ICD-10-CM | POA: Diagnosis not present

## 2018-10-14 DIAGNOSIS — Z9989 Dependence on other enabling machines and devices: Secondary | ICD-10-CM

## 2018-10-14 MED ORDER — MODAFINIL 200 MG PO TABS: ORAL_TABLET | ORAL | 0 refills | Status: DC

## 2018-10-14 NOTE — Progress Notes (Signed)
PATIENT: Valerie [name] DOB: March 17, 1961  REASON FOR VISIT: follow up HISTORY FROM: patient  Virtual Visit via Telephone Note  I connected with Valerie [name] on 10/14/18 at 10:30 AM EDT by telephone and verified that I am speaking with the correct person using two identifiers.   I discussed the limitations, risks, security and privacy concerns of performing an evaluation and management service by telephone and the availability of in person appointments. I also discussed with the patient that there may be a patient responsible charge related to this service. The patient expressed understanding and agreed to proceed.   History of Present Illness:  10/14/18 Valerie [name] is a 58 y.o. female for follow up.  She is using CPAP nightly.  She reports that her sleep patterns have been off for the last month but otherwise she is doing okay.  Compliance data as follows:  09/10/2018 - 10/09/2018 020 - 10/09/2018 Usage days 29/30 days (97%) >= 4 hours 27 days (90%) < 4 hours 2 days (7%) Usage hours 240 hours 17 minutes Average usage (total days) 8 hours 1 minutes Average usage (days used) 8 hours 17 minutes Median usage (days used) 8 hours 33 minutes Total used hours (value since last reset - 10/09/2018) 7,959 hours AirSense 10 AutoSet Serial number [id number] Mode AutoSet Min Pressure 5 cmH2O Max Pressure 15 cmH2O EPR Fulltime EPR level 2 Response Standard Therapy Pressure - cmH2O Median: 9.0 95th percentile: 11.6 Maximum: 13.0 Leaks - L/min Median: 7.6 95th percentile: 23.0 Maximum: 43.3 Events per hour AI: 2.5 HI: 0.3 AHI: 2.8 Apnea Index Central: 0.7 Obstructive: 1.5 Unknown: 0.2 RERA Index 0.3 Cheyne-Stokes respiration (average duration per night) 0 minutes (0%)   History (copied from Dr Valerie [name] note on 10/10/2017)  This patient's primary neurologist is Dr. Floyde [name].   Interval history from 10/11/2016. 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 She had just been discharged after a prolonged hospitalization  On April 14 th - 21 2018, for bleeding. and in the meantime has actually learned her diagnosis of microscopic polyangiitis. This explains her renal disorder, fatigue, GI bleeds and overall pain. Her rheumatologist just started her on methotrexate. She continues now to use CPAP endorsed the Epworth sleepiness score at 11 points fatigue severity at 48 points, she is highly compliant patient uses the machine 93% of the last 30 days with an average of 8 hours and 1 minute, the machine is an AutoSet between 5 and 15 cm water pressure with 2 cm expiratory pressure relief. Residual AHI is 2.0 pressure at the 91st percentile is 10 cm water she does have mild to moderate air leaks.    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 Joseph [name] for  OSA , diagnosed after a  sleep study on  12-22-2008 at [facility]. She underwent a SPLIT study 02-18-13  here at [place] . The patient underwent a retitration here at [facility] lab  on 9-9 2014,  with Dr. Joseph [name] listed as the referring physician.  She could not tolerate the CPAP settings that she had moved here with from [place] in early 2010.  At the time at [facility] she was titrated to 9 cm water- now for night use  later re\re titration resulted in a pressure of 20 and an AHI of 20.6 RDI of 33.8 REM AHI of 21.2. There was no tachybradycardia arrhythmia and altered no prolonged desaturation altered, and no periodic limb movements seen. The patient was now actually titrated to 12 cm water she is now using a ResMed Mirage mask in a small size and uses heated humidity. The machine is the same she had 2010 .  Valerie [name] will need a prescription for the full facemask that she has  used before and that she prefers using in times of nasal allergic rhinitis. She also will have cushions refilled for her nasal pillow mask. She reports that over the last week for 2 weeks she has become excessively daytime sleepy again currently she endorsed the Epworth sleepiness score on at 13 points and the fatigue severity score at 53-54 points. This is dated 08-19-14     Her sleep habits and social habits have not changed in any way to explain the increase in daytime sleepiness and fatigue. She takes Zyrtec in the morning during her allergy season which should not give her this degree of sleepiness. She had no infection or inflammatory disorders that she is aware of. He is easier short of breath and she does have some underlying wheezing which have been mentioned by Dr. Verline [name]. She never had wheezing before 2015 there of. This may be an asthmatic component and she has Proventil as an inhaler available .her voice in the choir is affected. Her endurance span is shorter.  Interval history from 12/30/2015. Valerie [name] was just released yesterday from a hospital stay necessitated by repeated loss of consciousness or loss of awareness, interpreted as sleep attacks!!!. She was admitted with acute renal failure and was confused. She states she spent the evening at a friend's house where she seemed to lose awareness of her surroundings, she reports felt falling asleep in the dry for. And neighbor knocked on the window of her car and woke her up. There have been several of these spells on Saturday last. By Monday she was admitted to hospital. This is not related to a sleep disorder with a metabolic problem with acute renal failure. 1. Confusion 2. Drowsiness 3. Dehydration 4. Abnormal urinalysis - Will cover patient for possible pyelonephritis with IM ceftriaxone given UA. We will also obtain head CT to r/o intracranial process. Consider using ciprofloxacin to supplement ceftriaxone. Urine culture pending.  Differential includes meningitis, adverse effects from multiple strong pain medications/CS. 5. Chronic pain syndrome - Counseled on use of controlled substances, patient insists that this is not a contributing factor. P ANCA  Positive.    Interval history from 11 Oct 2017 I had the pleasure of meeting with Valerie [name] today who has been a long-standing CPAP user.  Valerie [name] has been compliant her download confirms 97% compliance with an average usage time of 8 hours and 10 minutes, she is using an AutoSet CPAP between 5 and 15 cmH2O is 2 cm expiratory pressure relief.  Her residual AHI is only 2.5 there are no significant central apneas emergent, 95th percentile pressure is 11.3 cmH2O, well was in the pressure window was given.  She does have some minor air leaks.  She continues to be excessively daytime sleepy her Epworth score is between 10 and 12, she also struggles with fatigue and endorses fatigue severity score of 51 points.  Last year she had tested positive for a P ANCA, a repeat test however was negative she was referred  to [facility] rheumatology and was diagnosed with an HLA B 27, likely reflecting ankylosing spondylitis.  She underwent ECHO cardiography, LV hypertrophy, normal EF.   This may explain a lot of her joint pain and some of the residual fatigue as well.  Autoimmune diseases all contribute to fatigue syndromes.  Her persistent hypersomnia and fatigue do not bear the hallmarks of narcolepsy also she does struggle to stay awake and physically not active and mentally not stimulated. She does not endorse vivid dreams and rare, isolated  sleep paralysis. She feels sometimes frozen in her joints, not a muscle weakness.   Observations/Objective:  Generalized: Well developed, in no acute distress  Mentation: Alert oriented to time, place, history taking. Follows all commands speech and language fluent   Assessment and Plan:  58 y.o. year old female  has a past medical history of Acute  ischemic colitis ([place]), Anemia (05/01/2012), Anxiety, ARF (acute renal failure) ([place]) (12/28/2015), Arthritis, Asthma, Basal cell carcinoma of upper back (~ 2011), Chronic right-sided headaches, Complete tear of right rotator cuff, recurrent (04/09/2018), DDD (degenerative disc disease), lumbar, Diarrhea, Fatigue, Fibromyalgia, First degree heart block, GERD (gastroesophageal reflux disease) (04/06/2017), Headache(784.0), History of ischemic colitis (09/24/2016), Hypertension, Mental disorder, Microscopic polyangiitis ([place]), Nerve pain (2011), Other bursal cyst, right shoulder (04/09/2018), Pancreatitis, Pneumonia (~ 2014; 08/2015), Polymyalgia rheumatica ([place]) (onset 08/2015), 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 with    ICD-10-CM   1. OSA on CPAP G47.33    Z99.89    Precious Bard is doing well with CPAP therapy and shows optimal compliance.  She does have a mild leak and was instructed to monitor airflow surrounding her mask and tighten straps as indicated.  She was advised sleep hygiene and instructed to call if sleep concerns persist.  She was encouraged to continue CPAP therapy nightly and for greater than 4 hours each night.  Regular exercise and healthy diet also advised.  Follow-up in 1 year recommended.  She verbalizes understanding and agreement with this plan.  No orders of the defined types were placed in this encounter.   No orders of the defined types were placed in this encounter.    Follow Up Instructions:  I discussed the assessment and treatment plan with the patient. The patient was provided an opportunity to ask questions and all were answered. The patient agreed with the plan and demonstrated an understanding of the instructions.   The patient was advised to call back or seek an in-person evaluation if the symptoms worsen or if the condition fails to improve as anticipated.  I provided 25 minutes of non-face-to-face time during this encounter.  Patient refuses video  conference.  Teleconference completed with patient at her place of residence and provider at her place of residence.  Liane Comber, RN helped to facilitate visit.   Debbora Presto, NP

## 2018-10-14 NOTE — Telephone Encounter (Signed)
Drug registry checked  Modafinail 200mg  # 59 last filled 05/14/2018.

## 2018-10-14 NOTE — Telephone Encounter (Signed)
Pt requesting refill for modafinil (PROVIGIL) 200 MG tablet sent to [facility] on [address]

## 2018-10-17 DIAGNOSIS — F332 Major depressive disorder, recurrent severe without psychotic features: Secondary | ICD-10-CM | POA: Diagnosis not present

## 2018-10-30 ENCOUNTER — Telehealth: Payer: Self-pay

## 2018-10-30 NOTE — Telephone Encounter (Signed)
Follow up has been scheduled. Patient is aware of appt day and time.   

## 2018-11-01 DIAGNOSIS — F332 Major depressive disorder, recurrent severe without psychotic features: Secondary | ICD-10-CM | POA: Diagnosis not present

## 2018-11-14 DIAGNOSIS — F411 Generalized anxiety disorder: Secondary | ICD-10-CM | POA: Diagnosis not present

## 2018-11-14 DIAGNOSIS — Z20828 Contact with and (suspected) exposure to other viral communicable diseases: Secondary | ICD-10-CM | POA: Diagnosis not present

## 2018-11-14 DIAGNOSIS — F332 Major depressive disorder, recurrent severe without psychotic features: Secondary | ICD-10-CM | POA: Diagnosis not present

## 2018-11-15 DIAGNOSIS — M2560 Stiffness of unspecified joint, not elsewhere classified: Secondary | ICD-10-CM | POA: Diagnosis not present

## 2018-11-15 DIAGNOSIS — I10 Essential (primary) hypertension: Secondary | ICD-10-CM | POA: Diagnosis not present

## 2018-11-15 DIAGNOSIS — Z9989 Dependence on other enabling machines and devices: Secondary | ICD-10-CM | POA: Diagnosis not present

## 2018-11-15 DIAGNOSIS — G4733 Obstructive sleep apnea (adult) (pediatric): Secondary | ICD-10-CM | POA: Diagnosis not present

## 2018-11-15 DIAGNOSIS — N183 Chronic kidney disease, stage 3 (moderate): Secondary | ICD-10-CM | POA: Diagnosis not present

## 2018-11-15 DIAGNOSIS — G4710 Hypersomnia, unspecified: Secondary | ICD-10-CM | POA: Diagnosis not present

## 2018-11-15 DIAGNOSIS — Z20828 Contact with and (suspected) exposure to other viral communicable diseases: Secondary | ICD-10-CM | POA: Diagnosis not present

## 2018-11-15 DIAGNOSIS — R601 Generalized edema: Secondary | ICD-10-CM | POA: Diagnosis not present

## 2018-11-15 DIAGNOSIS — Z1589 Genetic susceptibility to other disease: Secondary | ICD-10-CM | POA: Diagnosis not present

## 2018-11-18 DIAGNOSIS — F332 Major depressive disorder, recurrent severe without psychotic features: Secondary | ICD-10-CM | POA: Diagnosis not present

## 2018-11-19 ENCOUNTER — Ambulatory Visit: Payer: Self-pay | Admitting: *Deleted

## 2018-11-19 NOTE — Telephone Encounter (Signed)
Pt reports was a pt of Dr. Manya Silvas at new practice. Reports had lab work done Friday. States Dr. Brigitte Pulse sent message via MyChart stating she may need IVFs per lab values. States BUN 33 and Cr. 2.04. States message was from today, unable to reach Dr. Brigitte Pulse. States "She said their is no one left in her office and to find a new PCP."  Concerned if she should go to ED for IVF's. Directed pt to follow dr. Manya Silvas disposition. States "But labs were from 5 days ago." Advised ED would evaluate. Offered to find PCP in system, declined.  Pt unsure if she will go to ED. Hung up before further care advise could be given.   Answer Assessment - Initial Assessment Questions 1. REASON FOR CALL or QUESTION: "What is your reason for calling today?" or "How can I best help you?" or "What question do you have that I can help answer?"  Protocols used: INFORMATION ONLY CALL-A-AH

## 2018-11-20 ENCOUNTER — Other Ambulatory Visit: Payer: Self-pay

## 2018-11-20 ENCOUNTER — Emergency Department (HOSPITAL_BASED_OUTPATIENT_CLINIC_OR_DEPARTMENT_OTHER)
Admission: EM | Admit: 2018-11-20 | Discharge: 2018-11-20 | Disposition: A | Discharge status: Home / Self Care | Payer: Medicare Other | Attending: Emergency Medicine | Admitting: Emergency Medicine

## 2018-11-20 ENCOUNTER — Encounter (HOSPITAL_BASED_OUTPATIENT_CLINIC_OR_DEPARTMENT_OTHER): Payer: Self-pay | Admitting: Emergency Medicine

## 2018-11-20 ENCOUNTER — Emergency Department (HOSPITAL_BASED_OUTPATIENT_CLINIC_OR_DEPARTMENT_OTHER): Payer: Medicare Other

## 2018-11-20 DIAGNOSIS — J45909 Unspecified asthma, uncomplicated: Secondary | ICD-10-CM | POA: Diagnosis not present

## 2018-11-20 DIAGNOSIS — Z79899 Other long term (current) drug therapy: Secondary | ICD-10-CM | POA: Insufficient documentation

## 2018-11-20 DIAGNOSIS — I130 Hypertensive heart and chronic kidney disease with heart failure and stage 1 through stage 4 chronic kidney disease, or unspecified chronic kidney disease: Secondary | ICD-10-CM | POA: Insufficient documentation

## 2018-11-20 DIAGNOSIS — I5032 Chronic diastolic (congestive) heart failure: Secondary | ICD-10-CM | POA: Diagnosis not present

## 2018-11-20 DIAGNOSIS — N179 Acute kidney failure, unspecified: Secondary | ICD-10-CM | POA: Diagnosis not present

## 2018-11-20 DIAGNOSIS — K760 Fatty (change of) liver, not elsewhere classified: Secondary | ICD-10-CM | POA: Diagnosis not present

## 2018-11-20 DIAGNOSIS — R160 Hepatomegaly, not elsewhere classified: Secondary | ICD-10-CM | POA: Diagnosis not present

## 2018-11-20 DIAGNOSIS — Z85828 Personal history of other malignant neoplasm of skin: Secondary | ICD-10-CM | POA: Insufficient documentation

## 2018-11-20 DIAGNOSIS — N183 Chronic kidney disease, stage 3 (moderate): Secondary | ICD-10-CM | POA: Diagnosis not present

## 2018-11-20 DIAGNOSIS — R000 Tachycardia, unspecified: Secondary | ICD-10-CM | POA: Diagnosis not present

## 2018-11-20 DIAGNOSIS — R109 Unspecified abdominal pain: Secondary | ICD-10-CM | POA: Diagnosis not present

## 2018-11-20 LAB — URINALYSIS, ROUTINE W REFLEX MICROSCOPIC
Bilirubin Urine: NEGATIVE
Glucose, UA: NEGATIVE mg/dL
Hgb urine dipstick: NEGATIVE
Ketones, ur: NEGATIVE mg/dL
Leukocytes,Ua: NEGATIVE
Nitrite: NEGATIVE
Protein, ur: NEGATIVE mg/dL
Specific Gravity, Urine: <1.005 — ABNORMAL LOW (ref 1.005–1.030)
pH: 6 (ref 5.0–8.0)

## 2018-11-20 LAB — BASIC METABOLIC PANEL
Anion gap: 13 (ref 5–15)
BUN: 31 mg/dL — ABNORMAL HIGH (ref 6–20)
CO2: 24 mmol/L (ref 22–32)
Calcium: 9.8 mg/dL (ref 8.9–10.3)
Chloride: 96 mmol/L — ABNORMAL LOW (ref 98–111)
Creatinine, Ser: 2.2 mg/dL — ABNORMAL HIGH (ref 0.44–1.00)
GFR calc Af Amer: 28 mL/min — ABNORMAL LOW (ref >60)
GFR calc non Af Amer: 24 mL/min — ABNORMAL LOW (ref >60)
Glucose, Bld: 139 mg/dL — ABNORMAL HIGH (ref 70–99)
Potassium: 4.2 mmol/L (ref 3.5–5.1)
Sodium: 133 mmol/L — ABNORMAL LOW (ref 135–145)

## 2018-11-20 LAB — CBC WITH DIFFERENTIAL/PLATELET
Abs Immature Granulocytes: 0.04 10*3/uL (ref 0.00–0.07)
Basophils Absolute: 0.1 10*3/uL (ref 0.0–0.1)
Basophils Relative: 1 %
Eosinophils Absolute: 0.2 10*3/uL (ref 0.0–0.5)
Eosinophils Relative: 2 %
HCT: 43 % (ref 36.0–46.0)
Hemoglobin: 13.9 g/dL (ref 12.0–15.0)
Immature Granulocytes: 0 %
Lymphocytes Relative: 28 %
Lymphs Abs: 3.1 10*3/uL (ref 0.7–4.0)
MCH: 29.3 pg (ref 26.0–34.0)
MCHC: 32.3 g/dL (ref 30.0–36.0)
MCV: 90.7 fL (ref 80.0–100.0)
Monocytes Absolute: 0.7 10*3/uL (ref 0.1–1.0)
Monocytes Relative: 6 %
Neutro Abs: 7 10*3/uL (ref 1.7–7.7)
Neutrophils Relative %: 63 %
Platelets: 296 10*3/uL (ref 150–400)
RBC: 4.74 MIL/uL (ref 3.87–5.11)
RDW: 13.2 % (ref 11.5–15.5)
WBC: 11.1 10*3/uL — ABNORMAL HIGH (ref 4.0–10.5)
nRBC: 0 % (ref 0.0–0.2)

## 2018-11-20 MED ORDER — SODIUM CHLORIDE 0.9 % IV BOLUS
1000.0000 mL | Freq: Once | INTRAVENOUS | Status: AC
  Administered 2018-11-20: 1000 mL via INTRAVENOUS

## 2018-11-20 NOTE — ED Provider Notes (Signed)
[facility] Provider Note   CSN: [id number] Arrival date & time: 11/20/18  0003    History   Chief Complaint Chief Complaint  Patient presents with  . Abnormal Lab    HPI Valerie [name] is a 58 y.o. female.     HPI  Is a 58 year old female with multiple medical problems including renal failure, pretension, reflux who presents with concerns for abnormal labs.  Patient reports she saw her primary physician last Tuesday.  She reports that over the last several weeks she has had some ongoing waxing and waning right flank pain "over my kidney" and was concerned about her kidney function.  She had lab work taken.  She found out today that her BUN and creatinine were elevated.  In addition to the right flank pain which comes and goes she reports some dizziness and tachycardia with effort.  She states that tonight she was fixing something for dinner and felt dizzy and "off."  She has not noted any hematuria no known history of kidney stones.  She does report "a sensation" when she urinates.  She usually reports a fever last Friday to 100.0.  She has had a known COVID sick contacts.  She denies any cough, chest pain, shortness of breath, abdominal pain, nausea, vomiting.  Patient reports that she took her morphine prior to arrival for her flank pain and currently her pain is 3 out of 10.  Nothing seems to make the pain better or worse and it comes and goes.  It does not radiate.  Past Medical History:  Diagnosis Date  . Acute ischemic colitis ([place])   . Anemia 05/01/2012  . Anxiety   . ARF (acute renal failure) ([place]) 12/28/2015  . Arthritis    "basically ~ q joint" (12/28/2015)  . Asthma   . Basal cell carcinoma of upper back ~ 2011  . Chronic right-sided headaches    and neck pain, relieved wiht PT and chronic practic manipulation  . Complete tear of right rotator cuff, recurrent 04/09/2018  . DDD (degenerative disc disease), lumbar   . Diarrhea    EPISODIC  . Fatigue    h/o excessive sedation, etiology unknown  . Fibromyalgia   . First degree heart block   . GERD (gastroesophageal reflux disease) 04/06/2017  . Headache(784.0)    "muscular w/migraine intensity; ~ 5-6 months" (12/28/2015)  . History of ischemic colitis 09/24/2016  . Hypertension   . Mental disorder    reports PTSD  . Microscopic polyangiitis ([place])   . Nerve pain 2011   nerve damage to right arm; normal EMG/NCV to RUExt after shoulder surgery 12/2009  . Other bursal cyst, right shoulder 04/09/2018  . Pancreatitis    due to Effexor  . Pneumonia ~ 2014; 08/2015   Aspiration  . Polymyalgia rheumatica ([place]) onset 08/2015   changed diagnosis to MPA  . Precancerous melanosis ([place])    "2nd toe left foot; 5th toe of right foot; right temple; had them all cut off"  . Sleep apnea    CPAP-9    Patient Active Problem List   Diagnosis Date Noted  . HTN (hypertension) 04/12/2018  . Chronic diastolic CHF (congestive heart failure) ([place]) 04/12/2018  . Shortness of breath   . Other bursal cyst, right shoulder, AC joint into trapezius 04/09/2018  . Persistent hypersomnia 10/11/2017  . HLA B27 (HLA B27 positive) 10/11/2017  . 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 Generalized edema 09/20/2017  .  Abdominal pain, chronic, generalized 09/20/2017  . Anxiety state 09/20/2017  . Chronic, continuous use of opioids 04/06/2017  . GERD (gastroesophageal reflux disease) 04/06/2017  . Cortisol deficiency ([place]) 01/31/2017  . Multiple stiff joints 01/31/2017  . Glucocorticoids and synthetic analogues causing adverse effect in therapeutic use 01/14/2017  . Hypothalamic-adrenal dysfunction ([place]) 01/14/2017  . Microscopic polyangiitis ([place]) 11/25/2016  . Neuropathic pain 11/23/2016  . P-ANCA and MPO antibodies positive 10/11/2016  . Impaired glucose tolerance 09/25/2016  . Lower GI bleed   . History of ischemic colitis 09/24/2016  . Colitis,  regional, with rectal bleeding ([place]) 09/24/2016  . Gastroparesis 09/18/2016  . Flank pain 09/18/2016  . Nausea without vomiting 09/18/2016  . CKD (chronic kidney disease) stage 3, GFR 30-59 ml/min ([facility])   . Obesity, morbid ([place]) 10/21/2012  . OSA on CPAP 10/21/2012  . Fibromyalgia 06/22/2012  . Back pain 06/22/2012  . Recurrent pancreatitis 06/22/2012  . Depression 06/22/2012  . Anemia 05/01/2012  . Leukocytosis 04/28/2012  . Colon polyp 08/01/2011  . PTSD (post-traumatic stress disorder) 08/01/2011  . Hypertension 08/01/2011  . IBS (irritable bowel syndrome) 08/01/2011    Past Surgical History:  Procedure Laterality Date  . BASAL CELL CARCINOMA EXCISION  ~ 2011   back  . BOTOX INJECTION N/A 11/06/2012   Procedure: BOTOX INJECTION;  Surgeon: Missy Sabins, MD;  Location: [facility];  Service: Endoscopy;  Laterality: N/A;  . BOTOX INJECTION N/A 12/04/2013   Procedure: BOTOX INJECTION;  Surgeon: Missy Sabins, MD;  Location: [place] [facility];  Service: Endoscopy;  Laterality: N/A;  . BOTOX INJECTION N/A 02/26/2014   Procedure: BOTOX INJECTION;  Surgeon: Missy Sabins, MD;  Location: [place];  Service: Endoscopy;  Laterality: N/A;  . BOTOX INJECTION N/A 07/08/2015   Procedure: BOTOX INJECTION;  Surgeon: Teena Irani, MD;  [facility];  Service: Endoscopy;  Laterality: N/A;  . BOTOX INJECTION N/A 04/27/2016   Procedure: BOTOX INJECTION;  Surgeon: Teena Irani, MD;  [facility];  Service: Endoscopy;  Laterality: N/A;  . BOTOX INJECTION N/A 04/09/2017   Procedure: BOTOX INJECTION;  Surgeon: Clarene Essex, MD;  Location: [place];  Service: Endoscopy;  Laterality: N/A;  . ESOPHAGOGASTRODUODENOSCOPY  12/21/2011   Procedure: ESOPHAGOGASTRODUODENOSCOPY (EGD);  Surgeon: Missy Sabins, MD;  Location: [facility] END[facility]COPY;  Service: Endoscopy;  Laterality: N/A;  . ESOPHAGOGASTRODUODENOSCOPY N/A 11/06/2012   Procedure: ESOPHAGOGASTRODUODENOSCOPY (EGD);  Surgeon: Missy Sabins, MD;   Location: [facility];  Service: Endoscopy;  Laterality: N/A;  . ESOPHAGOGASTRODUODENOSCOPY N/A 12/04/2013   Procedure: ESOPHAGOGASTRODUODENOSCOPY (EGD);  Surgeon: Missy Sabins, MD;  Location[facility]COPY;  Service: Endoscopy;  Laterality: N/A;  . ESOPHAGOGASTRODUODENOSCOPY N/A 02/26/2014   Procedure: ESOPHAGOGASTRODUODENOSCOPY (EGD);  Surgeon: Missy Sabins, MD;  Location: [facility] ENDOSCOPY;  Service: Endoscopy;  Laterality: N/A;  . ESOPHAGOGASTRODUODENOSCOPY N/A 04/09/2017   Procedure: ESOPHAGOGASTRODUODENOSCOPY (EGD);  Surgeon: Clarene Essex, MD;  Location: [place];  Service: Endoscopy;  Laterality: N/A;  . ESOPHAGOGASTRODUODENOSCOPY (EGD) WITH PROPOFOL N/A 07/08/2015   Procedure: ESOPHAGOGASTRODUODENOSCOPY (EGD) WITH PROPOFOL;  Surgeon: Teena Irani, MD;  [facility];  Service: Endoscopy;  Laterality: N/A;  . ESOPHAGOGASTRODUODENOSCOPY (EGD) WITH PROPOFOL N/A 04/27/2016   Procedure: ESOPHAGOGASTRODUODENOSCOPY (EGD) WITH PROPOFOL;  Surgeon: Teena Irani, MD;  [facility];  Service: Endoscopy;  Laterality: N/A;  . EXCISION MASS UPPER EXTREMETIES Right 04/09/2018   Procedure: EXCISION RIGHT SHOULDER CYST;  Surgeon: Marchia Bond, MD;  Location: [facility];  Service: Orthopedics;  Laterality: Right;  Interscalene Block  . FLEXIBLE  SIGMOIDOSCOPY  05/01/2012   Procedure: FLEXIBLE SIGMOIDOSCOPY;  Surgeon: Jeryl Columbia, MD;  [facility];  Service: Endoscopy;  Laterality: N/A;  unprepped  . FLEXIBLE SIGMOIDOSCOPY Left 09/28/2016   Procedure: FLEXIBLE SIGMOIDOSCOPY;  Surgeon: Wilford Corner, MD;  [facility];  Service: Endoscopy;  Laterality: Left;  . LAPAROSCOPIC CHOLECYSTECTOMY  01/2002  . NASAL SEPTOPLASTY W/ TURBINOPLASTY  1998  . SHOULDER ARTHROSCOPY WITH ROTATOR CUFF REPAIR Right 04/09/2018   Procedure: RIGHT SHOULDER ARTHROSCOPY WITH EXTENSIVE DEBRIDEMENT;  Surgeon: Marchia Bond, MD;  Location: [facility];  Service: Orthopedics;  Laterality: Right;  Interscalene  Block  . SHOULDER ARTHROSCOPY WITH ROTATOR CUFF REPAIR AND OPEN BICEPS TENODESIS Bilateral 2010-2013   left-right     OB History   No obstetric history on file.      Home Medications    Prior to Admission medications   Medication Sig Start Date End Date Taking? Authorizing Provider  albuterol (PROAIR HFA) 108 (90 Base) MCG/ACT inhaler Inhale 2 puffs into the lungs every 4 (four) hours as needed for wheezing or shortness of breath. 04/24/18   Shawnee Knapp, MD  allopurinol (ZYLOPRIM) 300 MG tablet TAKE 1/2 TABLET BY MOUTH ONCE A DAY 08/08/18   Shawnee Knapp, MD  ALPRAZolam Duanne Moron) 1 MG tablet Take 1 tablet (1 mg total) by mouth 3 (three) times daily as needed. for anxiety 08/08/18   Shawnee Knapp, MD  baclofen (LIORESAL) 10 MG tablet Take 10 mg by mouth 3 (three) times daily as needed for muscle spasms.     [provider]  benazepril (LOTENSIN) 40 MG tablet Take 1 tablet (40 mg total) by mouth daily. 08/08/18   Shawnee Knapp, MD  Calcium Carb-Cholecalciferol (CALCIUM 600 + D PO) Take 0.5 tablets by mouth daily.    [provider]  CARAFATE 1 GM/10ML suspension TAKE 10 ML'S BY MOUTH FOUR TIMES A DAY AS NEEDED FOR STOMACH PAIN 05/06/18   Shawnee Knapp, MD  cetirizine (ZYRTEC) 10 MG tablet Take 10 mg by mouth daily.     [provider]  Cholecalciferol (VITAMIN D3) 5000 units CAPS Take 5,000 Units by mouth daily.     [provider]  dexlansoprazole (DEXILANT) 60 MG capsule Take 60 mg by mouth 2 (two) times daily.    [provider]  fexofenadine (ALLEGRA) 180 MG tablet Take 180 mg by mouth daily. Switches between Allegra and Zyrtec as needed for allergies    [provider]  fluticasone (FLONASE) 50 MCG/ACT nasal spray PLACE 2 SPRAYS INTO BOTH NOSTRILS DAILY Patient taking differently: Place 2 sprays into both nostrils at bedtime.  11/28/17   Shawnee Knapp, MD  HYDROcodone-acetaminophen [facility]) 10-325 MG per tablet Take 1-2 tablets by mouth every 6  (six) hours as needed for moderate pain (pain.).     [provider]  hydrOXYzine (ATARAX/VISTARIL) 10 MG tablet Take 1-3 tablets (10-30 mg total) by mouth 3 (three) times daily as needed for anxiety. 08/08/18   Shawnee Knapp, MD  ibuprofen (ADVIL,MOTRIN) 600 MG tablet Take 600 mg by mouth daily as needed (migraines).    [provider]  ipratropium (ATROVENT) 0.03 % nasal spray PLACE 2 SPRAYS INTO THE AFFECTED NOSTRIL(S) 4 TIMES A DAY 09/13/18   Rutherford Guys, MD  Melatonin 5 MG TABS Take 5-10 mg by mouth at bedtime as needed (sleep).    [provider]  metoprolol succinate (TOPROL-XL) 100 MG 24 hr tablet Take 1 tablet (100 mg total) by  mouth daily. Take with or immediately following a meal. 08/08/18   Shawnee Knapp, MD  modafinil (PROVIGIL) 200 MG tablet Take 1/2 tablet twice daily 10/14/18   Lomax, Amy, NP  montelukast (SINGULAIR) 10 MG tablet TAKE ONE TABLET BY MOUTH EVERY NIGHT AT BEDTIME 06/06/18   Shawnee Knapp, MD  morphine (MS CONTIN) 30 MG 12 hr tablet Take 30 mg by mouth every 8 (eight) hours. Not from [facility] 09/23/12   [provider]  Multiple Vitamins-Minerals (MULTIVITAMIN PO) Take 1 tablet by mouth daily.    [provider]  Olopatadine HCl (PAZEO) 0.7 % SOLN Apply 1 drop to eye daily as needed (allergies). 08/08/18   Shawnee Knapp, MD  ondansetron (ZOFRAN) 4 MG tablet Take 1 tablet (4 mg total) by mouth every 8 (eight) hours as needed for nausea or vomiting. 11/18/16   Tereasa Coop, PA-C  Pancrelipase, Lip-Prot-Amyl, (CREON) 24000 UNITS CPEP Take 72,000 Units by mouth 3 (three) times daily as needed (for pancreatitis).     [provider]  promethazine (PHENERGAN) 25 MG tablet Take 1 tablet (25 mg total) by mouth every 6 (six) hours as needed for nausea or vomiting. 08/08/18   Shawnee Knapp, MD  spironolactone (ALDACTONE) 100 MG tablet Take 1 tablet (100 mg total) by mouth daily. 08/08/18   Shawnee Knapp, MD  torsemide (DEMADEX) 10 MG tablet Take 1  tablet (10 mg total) by mouth daily as needed (edema). 08/08/18   Shawnee Knapp, MD    Family History Family History  Problem Relation Age of Onset  . Heart disease Mother   . COPD Mother   . Arthritis Mother   . Cancer Mother   . Hypertension Mother   . Arthritis Paternal Grandmother   . Heart disease Paternal Grandmother   . Heart disease Paternal Grandfather     Social History Social History   Tobacco Use  . Smoking status: Never Smoker  . Smokeless tobacco: Never Used  Substance Use Topics  . Alcohol use: No  . Drug use: No     Allergies   Dilaudid [hydromorphone hcl]; Effexor [venlafaxine]; Pregabalin; Adhesive [tape]; Metoclopramide hcl; Neurontin [gabapentin]; Other; and Serotonin reuptake inhibitors (ssris)   Review of Systems Review of Systems  Constitutional: Positive for fatigue and fever.  Respiratory: Negative for shortness of breath.   Cardiovascular: Negative for chest pain.  Gastrointestinal: Negative for abdominal pain, constipation, diarrhea, nausea and vomiting.  Genitourinary: Positive for dysuria and flank pain.  Musculoskeletal: Negative for back pain.  Skin: Negative for rash.  Neurological: Positive for dizziness.  Psychiatric/Behavioral: Negative for confusion.  All other systems reviewed and are negative.    Physical Exam Updated Vital Signs BP (!) 142/80 (BP Location: Right Arm)   Pulse 75   Temp 98.8 F (37.1 C) (Oral)   Resp 16   Ht 1.651 m (_0 )   Wt 133.4 kg   SpO2 100%   BMI 48.93 kg/m   Physical Exam Vitals signs and nursing note reviewed.  Constitutional:      Appearance: She is well-developed. She is obese.  HENT:     Head: Normocephalic and atraumatic.     Mouth/Throat:     Mouth: Mucous membranes are moist.  Eyes:     Pupils: Pupils are equal, round, and reactive to light.  Neck:     Musculoskeletal: Neck supple.  Cardiovascular:     Rate and Rhythm: Regular rhythm. Tachycardia present.     Heart sounds:  Normal heart sounds.  Pulmonary:     Effort: Pulmonary effort is normal. No respiratory distress.     Breath sounds: No wheezing.  Abdominal:     General: Bowel sounds are normal.     Palpations: Abdomen is soft.     Tenderness: There is no abdominal tenderness. There is no right CVA tenderness or left CVA tenderness.  Musculoskeletal:     Right lower leg: No edema.     Left lower leg: No edema.  Skin:    General: Skin is warm and dry.  Neurological:     Mental Status: She is alert and oriented to person, place, and time.  Psychiatric:        Mood and Affect: Mood normal.      ED Treatments / Results  Labs (all labs ordered are listed, but only abnormal results are displayed) Labs Reviewed  CBC WITH DIFFERENTIAL/PLATELET - Abnormal; Notable for the following components:      Result Value   WBC 11.1 (*)    All other components within normal limits  BASIC METABOLIC PANEL - Abnormal; Notable for the following components:   Sodium 133 (*)    Chloride 96 (*)    Glucose, Bld 139 (*)    BUN 31 (*)    Creatinine, Ser 2.20 (*)    GFR calc non Af Amer 24 (*)    GFR calc Af Amer 28 (*)    All other components within normal limits  URINALYSIS, ROUTINE W REFLEX MICROSCOPIC - Abnormal; Notable for the following components:   Specific Gravity, Urine <1.005 (*)    All other components within normal limits    EKG EKG Interpretation  Date/Time:  Wednesday November 20 2018 00:37:27 EDT Ventricular Rate:  78 PR Interval:    QRS Duration: 97 QT Interval:  379 QTC Calculation: 432 R Axis:   19 Text Interpretation:  Sinus rhythm Confirmed by Thayer Jew [phone]) on 11/20/2018 1:23:07 AM   Radiology Ct Renal Stone Study  Result Date: 11/20/2018 CLINICAL DATA:  Right flank pain EXAM: CT ABDOMEN AND PELVIS WITHOUT CONTRAST TECHNIQUE: Multidetector CT imaging of the abdomen and pelvis was performed following the standard protocol without IV contrast. COMPARISON:  CT dated 11/18/2016.  FINDINGS: Lower chest: No acute abnormality. Hepatobiliary: There is hepatomegaly with hepatic steatosis. The patient is status post prior cholecystectomy. Pancreas: Unremarkable. No pancreatic ductal dilatation or surrounding inflammatory changes. Spleen: Normal in size without focal abnormality. Adrenals/Urinary Tract: Adrenal glands are unremarkable. Kidneys are normal, without renal calculi, focal lesion, or hydronephrosis. Bladder is unremarkable. Stomach/Bowel: Stomach is within normal limits. Appendix appears normal. No evidence of bowel wall thickening, distention, or inflammatory changes. Vascular/Lymphatic: No significant vascular findings are present. No enlarged abdominal or pelvic lymph nodes. Reproductive: Uterus and bilateral adnexa are unremarkable. Other: No abdominal wall hernia or abnormality. No abdominopelvic ascites. Musculoskeletal: No acute or significant osseous findings. IMPRESSION: 1. No acute abdominal abnormality detected. There is no hydronephrosis. There are no radiopaque kidney stones. 2. Hepatic steatosis with hepatomegaly. 3. Normal appendix in the right lower quadrant. Electronically Signed   By: Constance Holster M.D.   On: 11/20/2018 02:06    Procedures Procedures (including critical care time)  Medications Ordered in ED Medications  sodium chloride 0.9 % bolus 1,000 mL ( Intravenous Stopped 11/20/18 0212)     Initial Impression / Assessment and Plan / ED Course  I have reviewed the triage vital signs and the nursing notes.  Pertinent labs & imaging results that  were available during my care of the patient were reviewed by me and considered in my medical decision making (see chart for details).        Presents with several complaints; however, her main concern is her elevated creatinine from her primary physician.  She is overall nontoxic and vital signs are reassuring.  She reports intermittent flank pain.  Considerations include kidney stone, urinary tract  infection.  She does not appear septic or clinically ill.  Patient was given fluids.  Lab work obtained.  Creatinine is elevated from baseline to 2.2.  Baseline is around 1.1.  CT stone study does not show any evidence of acute stone.  No other obvious source of her flank pain.  Otherwise her lab work-up is reassuring.  She was slightly tachycardic upon arrival and her EKG shows sinus tach without arrhythmia or ischemia.  Pulse rate improved with hydration.  Patient was updated.  Recommend follow-up with her nephrologist at [facility].  Patient is in agreement with plan.  After history, exam, and medical workup I feel the patient has been appropriately medically screened and is safe for discharge home. Pertinent diagnoses were discussed with the patient. Patient was given return precautions.   Final Clinical Impressions(s) / ED Diagnoses   Final diagnoses:  AKI (acute kidney injury) ([place])  Flank pain    ED Discharge Orders    None       Lalena Salas, Barbette Hair, MD 11/20/18 [phone]

## 2018-11-20 NOTE — ED Triage Notes (Signed)
Pt states she had blood work done Friday and got results today  BUN and creatinine were elevated  Pt states she has been tachycardic doing everyday things

## 2018-11-20 NOTE — Discharge Instructions (Addendum)
Today with concerns for elevated creatinine.  Your elevated to 2.2.  You need to follow-up closely with your nephrologist.  Make sure that you are staying well-hydrated.  Avoid NSAIDs or other nephrotoxic medications.

## 2018-11-20 NOTE — ED Triage Notes (Signed)
Pt states she had a covid exposure Monday June 1st  Pt states she was tested on Friday for the antibodies that came back negative

## 2018-11-20 NOTE — ED Notes (Signed)
Patient aware of need for urine. Specimen cup provided.

## 2018-11-29 DIAGNOSIS — Z6841 Body Mass Index (BMI) 40.0 and over, adult: Secondary | ICD-10-CM | POA: Diagnosis not present

## 2018-11-29 DIAGNOSIS — M317 Microscopic polyangiitis: Secondary | ICD-10-CM | POA: Diagnosis not present

## 2018-11-29 DIAGNOSIS — I130 Hypertensive heart and chronic kidney disease with heart failure and stage 1 through stage 4 chronic kidney disease, or unspecified chronic kidney disease: Secondary | ICD-10-CM | POA: Diagnosis not present

## 2018-11-29 DIAGNOSIS — E2749 Other adrenocortical insufficiency: Secondary | ICD-10-CM | POA: Diagnosis not present

## 2018-11-29 DIAGNOSIS — E279 Disorder of adrenal gland, unspecified: Secondary | ICD-10-CM | POA: Diagnosis not present

## 2018-11-29 DIAGNOSIS — I10 Essential (primary) hypertension: Secondary | ICD-10-CM | POA: Diagnosis not present

## 2018-11-29 DIAGNOSIS — N183 Chronic kidney disease, stage 3 (moderate): Secondary | ICD-10-CM | POA: Diagnosis not present

## 2018-11-29 DIAGNOSIS — N179 Acute kidney failure, unspecified: Secondary | ICD-10-CM | POA: Diagnosis not present

## 2018-11-29 DIAGNOSIS — M318 Other specified necrotizing vasculopathies: Secondary | ICD-10-CM | POA: Diagnosis not present

## 2018-11-29 DIAGNOSIS — G8929 Other chronic pain: Secondary | ICD-10-CM | POA: Diagnosis not present

## 2018-11-29 DIAGNOSIS — E233 Hypothalamic dysfunction, not elsewhere classified: Secondary | ICD-10-CM | POA: Diagnosis not present

## 2018-11-29 DIAGNOSIS — I5032 Chronic diastolic (congestive) heart failure: Secondary | ICD-10-CM | POA: Diagnosis not present

## 2018-12-05 DIAGNOSIS — F332 Major depressive disorder, recurrent severe without psychotic features: Secondary | ICD-10-CM | POA: Diagnosis not present

## 2018-12-11 DIAGNOSIS — F332 Major depressive disorder, recurrent severe without psychotic features: Secondary | ICD-10-CM | POA: Diagnosis not present

## 2018-12-18 DIAGNOSIS — N189 Chronic kidney disease, unspecified: Secondary | ICD-10-CM | POA: Diagnosis not present

## 2018-12-25 DIAGNOSIS — F332 Major depressive disorder, recurrent severe without psychotic features: Secondary | ICD-10-CM | POA: Diagnosis not present

## 2018-12-31 DIAGNOSIS — R109 Unspecified abdominal pain: Secondary | ICD-10-CM | POA: Diagnosis not present

## 2018-12-31 DIAGNOSIS — I10 Essential (primary) hypertension: Secondary | ICD-10-CM | POA: Diagnosis not present

## 2018-12-31 DIAGNOSIS — N183 Chronic kidney disease, stage 3 (moderate): Secondary | ICD-10-CM | POA: Diagnosis not present

## 2018-12-31 DIAGNOSIS — R110 Nausea: Secondary | ICD-10-CM | POA: Diagnosis not present

## 2019-01-01 DIAGNOSIS — F332 Major depressive disorder, recurrent severe without psychotic features: Secondary | ICD-10-CM | POA: Diagnosis not present

## 2019-01-06 DIAGNOSIS — Z9989 Dependence on other enabling machines and devices: Secondary | ICD-10-CM | POA: Diagnosis not present

## 2019-01-06 DIAGNOSIS — E669 Obesity, unspecified: Secondary | ICD-10-CM | POA: Diagnosis not present

## 2019-01-06 DIAGNOSIS — F332 Major depressive disorder, recurrent severe without psychotic features: Secondary | ICD-10-CM | POA: Diagnosis not present

## 2019-01-06 DIAGNOSIS — F4312 Post-traumatic stress disorder, chronic: Secondary | ICD-10-CM | POA: Diagnosis not present

## 2019-01-06 DIAGNOSIS — M542 Cervicalgia: Secondary | ICD-10-CM | POA: Diagnosis not present

## 2019-01-06 DIAGNOSIS — K219 Gastro-esophageal reflux disease without esophagitis: Secondary | ICD-10-CM | POA: Diagnosis not present

## 2019-01-06 DIAGNOSIS — M79605 Pain in left leg: Secondary | ICD-10-CM | POA: Diagnosis not present

## 2019-01-06 DIAGNOSIS — G4733 Obstructive sleep apnea (adult) (pediatric): Secondary | ICD-10-CM | POA: Diagnosis not present

## 2019-01-06 DIAGNOSIS — M533 Sacrococcygeal disorders, not elsewhere classified: Secondary | ICD-10-CM | POA: Diagnosis not present

## 2019-01-06 DIAGNOSIS — M25511 Pain in right shoulder: Secondary | ICD-10-CM | POA: Diagnosis not present

## 2019-01-06 DIAGNOSIS — M545 Low back pain: Secondary | ICD-10-CM | POA: Diagnosis not present

## 2019-01-06 DIAGNOSIS — Z8709 Personal history of other diseases of the respiratory system: Secondary | ICD-10-CM | POA: Diagnosis not present

## 2019-01-06 DIAGNOSIS — Z8639 Personal history of other endocrine, nutritional and metabolic disease: Secondary | ICD-10-CM | POA: Diagnosis not present

## 2019-01-06 DIAGNOSIS — M79604 Pain in right leg: Secondary | ICD-10-CM | POA: Diagnosis not present

## 2019-01-06 DIAGNOSIS — M317 Microscopic polyangiitis: Secondary | ICD-10-CM | POA: Diagnosis not present

## 2019-01-06 DIAGNOSIS — Z79891 Long term (current) use of opiate analgesic: Secondary | ICD-10-CM | POA: Diagnosis not present

## 2019-01-06 DIAGNOSIS — K559 Vascular disorder of intestine, unspecified: Secondary | ICD-10-CM | POA: Diagnosis not present

## 2019-01-06 DIAGNOSIS — Z8739 Personal history of other diseases of the musculoskeletal system and connective tissue: Secondary | ICD-10-CM | POA: Diagnosis not present

## 2019-01-08 DIAGNOSIS — E875 Hyperkalemia: Secondary | ICD-10-CM | POA: Diagnosis not present

## 2019-01-23 DIAGNOSIS — N183 Chronic kidney disease, stage 3 (moderate): Secondary | ICD-10-CM | POA: Diagnosis not present

## 2019-01-28 ENCOUNTER — Other Ambulatory Visit: Payer: Self-pay | Admitting: Family Medicine

## 2019-01-29 ENCOUNTER — Telehealth: Payer: Self-pay | Admitting: Family Medicine

## 2019-01-29 MED ORDER — MODAFINIL 200 MG PO TABS: ORAL_TABLET | ORAL | 3 refills | Status: DC

## 2019-01-29 NOTE — Telephone Encounter (Signed)
Patient needs 90 day supply and 3 refills.

## 2019-01-29 NOTE — Telephone Encounter (Signed)
I called pt.   She stated was told by mychart support that after one year if pt has not seen provider, the providers name is deleted.  I will have call and [phone] and verify.

## 2019-01-29 NOTE — Telephone Encounter (Signed)
Pt states she attempted to message NP Amy and Dr Brett Fairy and was told by the help desk that since she is only seen once a year she would have to call the office and ask that they be added.  Pt is asking for a call from RN to discuss

## 2019-01-30 ENCOUNTER — Telehealth: Payer: Self-pay | Admitting: *Deleted

## 2019-01-30 DIAGNOSIS — F332 Major depressive disorder, recurrent severe without psychotic features: Secondary | ICD-10-CM | POA: Diagnosis not present

## 2019-01-30 NOTE — Telephone Encounter (Signed)
At the request of Mickel Fuchs, RN the patients MyChart was deactivated and and a new activation code was mailed to patient.

## 2019-01-30 NOTE — Telephone Encounter (Signed)
I called pt back she was able to get her old mychart account back so she has her information.  She states that she needs for our office to add Dr. Brett Fairy and amy lomaxNP to her account. This is what mychart support told her to do. I am not sure how to do will ask other person in the office.

## 2019-01-30 NOTE — Telephone Encounter (Signed)
I called pt to let her know that I spoke to [place] support.  She needs to activate new mychart log in.  I did deactivate and relayed to pt and she was upset that did this prior letting her know.  She needed all those records that she had.  I apologized and relayed that her records could be received from [facility] providers, but she stated no no this could not have happened.  I told her I would call mychart and see if the records are still there for her to retrieve.  I gave her new activation code to resign up.  [phone].

## 2019-01-30 NOTE — Telephone Encounter (Signed)
I called and spoke to [place] in [place] support.  She states if not used in a yr, will delete.  Will need new activation form.

## 2019-02-10 DIAGNOSIS — F332 Major depressive disorder, recurrent severe without psychotic features: Secondary | ICD-10-CM | POA: Diagnosis not present

## 2019-02-18 DIAGNOSIS — F332 Major depressive disorder, recurrent severe without psychotic features: Secondary | ICD-10-CM | POA: Diagnosis not present

## 2019-02-25 ENCOUNTER — Other Ambulatory Visit: Payer: Self-pay | Admitting: Gastroenterology

## 2019-02-27 DIAGNOSIS — F332 Major depressive disorder, recurrent severe without psychotic features: Secondary | ICD-10-CM | POA: Diagnosis not present

## 2019-03-03 ENCOUNTER — Other Ambulatory Visit (HOSPITAL_COMMUNITY)
Admission: RE | Admit: 2019-03-03 | Discharge: 2019-03-03 | Disposition: A | Discharge status: Home / Self Care | Payer: Medicare Other | Source: Ambulatory Visit | Attending: Gastroenterology | Admitting: Gastroenterology

## 2019-03-03 DIAGNOSIS — Z20828 Contact with and (suspected) exposure to other viral communicable diseases: Secondary | ICD-10-CM | POA: Diagnosis not present

## 2019-03-03 DIAGNOSIS — Z01812 Encounter for preprocedural laboratory examination: Secondary | ICD-10-CM | POA: Insufficient documentation

## 2019-03-04 ENCOUNTER — Encounter (HOSPITAL_COMMUNITY): Payer: Self-pay | Admitting: *Deleted

## 2019-03-04 ENCOUNTER — Other Ambulatory Visit: Payer: Self-pay

## 2019-03-05 LAB — NOVEL CORONAVIRUS, NAA (HOSP ORDER, SEND-OUT TO REF LAB; TAT 18-24 HRS): SARS-CoV-2, NAA: NOT DETECTED

## 2019-03-06 ENCOUNTER — Ambulatory Visit (HOSPITAL_COMMUNITY): Payer: Medicare Other

## 2019-03-06 ENCOUNTER — Ambulatory Visit (HOSPITAL_COMMUNITY)
Admission: RE | Admit: 2019-03-06 | Discharge: 2019-03-06 | Disposition: A | Discharge status: Home / Self Care | Payer: Medicare Other | Attending: Gastroenterology | Admitting: Gastroenterology

## 2019-03-06 ENCOUNTER — Other Ambulatory Visit: Payer: Self-pay

## 2019-03-06 ENCOUNTER — Encounter (HOSPITAL_COMMUNITY)
Admission: RE | Disposition: A | Discharge status: Home / Self Care | Payer: Self-pay | Source: Home / Self Care | Attending: Gastroenterology

## 2019-03-06 ENCOUNTER — Encounter (HOSPITAL_COMMUNITY): Payer: Self-pay

## 2019-03-06 DIAGNOSIS — Z791 Long term (current) use of non-steroidal anti-inflammatories (NSAID): Secondary | ICD-10-CM | POA: Insufficient documentation

## 2019-03-06 DIAGNOSIS — Z79899 Other long term (current) drug therapy: Secondary | ICD-10-CM | POA: Insufficient documentation

## 2019-03-06 DIAGNOSIS — K3184 Gastroparesis: Secondary | ICD-10-CM | POA: Diagnosis not present

## 2019-03-06 DIAGNOSIS — H2513 Age-related nuclear cataract, bilateral: Secondary | ICD-10-CM | POA: Diagnosis not present

## 2019-03-06 DIAGNOSIS — Z888 Allergy status to other drugs, medicaments and biological substances status: Secondary | ICD-10-CM | POA: Diagnosis not present

## 2019-03-06 DIAGNOSIS — I5032 Chronic diastolic (congestive) heart failure: Secondary | ICD-10-CM | POA: Insufficient documentation

## 2019-03-06 DIAGNOSIS — I130 Hypertensive heart and chronic kidney disease with heart failure and stage 1 through stage 4 chronic kidney disease, or unspecified chronic kidney disease: Secondary | ICD-10-CM | POA: Diagnosis not present

## 2019-03-06 DIAGNOSIS — F419 Anxiety disorder, unspecified: Secondary | ICD-10-CM | POA: Diagnosis not present

## 2019-03-06 DIAGNOSIS — K219 Gastro-esophageal reflux disease without esophagitis: Secondary | ICD-10-CM | POA: Diagnosis not present

## 2019-03-06 DIAGNOSIS — H5213 Myopia, bilateral: Secondary | ICD-10-CM | POA: Diagnosis not present

## 2019-03-06 DIAGNOSIS — Z6841 Body Mass Index (BMI) 40.0 and over, adult: Secondary | ICD-10-CM | POA: Diagnosis not present

## 2019-03-06 DIAGNOSIS — J45909 Unspecified asthma, uncomplicated: Secondary | ICD-10-CM | POA: Insufficient documentation

## 2019-03-06 DIAGNOSIS — M1990 Unspecified osteoarthritis, unspecified site: Secondary | ICD-10-CM | POA: Diagnosis not present

## 2019-03-06 DIAGNOSIS — Z79891 Long term (current) use of opiate analgesic: Secondary | ICD-10-CM | POA: Diagnosis not present

## 2019-03-06 DIAGNOSIS — G4733 Obstructive sleep apnea (adult) (pediatric): Secondary | ICD-10-CM | POA: Diagnosis not present

## 2019-03-06 DIAGNOSIS — Z885 Allergy status to narcotic agent status: Secondary | ICD-10-CM | POA: Insufficient documentation

## 2019-03-06 DIAGNOSIS — M797 Fibromyalgia: Secondary | ICD-10-CM | POA: Diagnosis not present

## 2019-03-06 DIAGNOSIS — I110 Hypertensive heart disease with heart failure: Secondary | ICD-10-CM | POA: Diagnosis not present

## 2019-03-06 DIAGNOSIS — N183 Chronic kidney disease, stage 3 (moderate): Secondary | ICD-10-CM | POA: Insufficient documentation

## 2019-03-06 DIAGNOSIS — H43811 Vitreous degeneration, right eye: Secondary | ICD-10-CM | POA: Diagnosis not present

## 2019-03-06 DIAGNOSIS — N3010 Interstitial cystitis (chronic) without hematuria: Secondary | ICD-10-CM | POA: Diagnosis not present

## 2019-03-06 DIAGNOSIS — H5315 Visual distortions of shape and size: Secondary | ICD-10-CM | POA: Diagnosis not present

## 2019-03-06 HISTORY — PX: ESOPHAGOGASTRODUODENOSCOPY (EGD) WITH PROPOFOL: SHX5813

## 2019-03-06 HISTORY — DX: Chronic kidney disease, stage 2 (mild): N18.2

## 2019-03-06 HISTORY — PX: SUBMUCOSAL INJECTION: SHX5543

## 2019-03-06 SURGERY — ESOPHAGOGASTRODUODENOSCOPY (EGD) WITH PROPOFOL
Anesthesia: Monitor Anesthesia Care

## 2019-03-06 MED ORDER — PROPOFOL 10 MG/ML IV BOLUS
INTRAVENOUS | Status: DC | PRN
  Administered 2019-03-06 (×4): 20 mg via INTRAVENOUS

## 2019-03-06 MED ORDER — LIDOCAINE HCL 1 % IJ SOLN
INTRAMUSCULAR | Status: DC | PRN
  Administered 2019-03-06: 100 mg via INTRADERMAL

## 2019-03-06 MED ORDER — ONABOTULINUMTOXINA 100 UNITS IJ SOLR
INTRAMUSCULAR | Status: AC
  Filled 2019-03-06: qty 100

## 2019-03-06 MED ORDER — SODIUM CHLORIDE (PF) 0.9 % IJ SOLN
INTRAMUSCULAR | Status: DC | PRN
  Administered 2019-03-06: 100 mL via SUBMUCOSAL

## 2019-03-06 MED ORDER — SODIUM CHLORIDE 0.9 % IV SOLN: INTRAVENOUS | Status: DC

## 2019-03-06 MED ORDER — PROPOFOL 10 MG/ML IV BOLUS
INTRAVENOUS | Status: AC
  Filled 2019-03-06: qty 40

## 2019-03-06 MED ORDER — SODIUM CHLORIDE (PF) 0.9 % IJ SOLN
INTRAMUSCULAR | Status: AC
  Filled 2019-03-06: qty 10

## 2019-03-06 MED ORDER — LACTATED RINGERS IV SOLN
INTRAVENOUS | Status: DC | PRN
  Administered 2019-03-06: 14:00:00 via INTRAVENOUS

## 2019-03-06 MED ORDER — PROPOFOL 500 MG/50ML IV EMUL
INTRAVENOUS | Status: DC | PRN
  Administered 2019-03-06: 80 ug/kg/min via INTRAVENOUS

## 2019-03-06 SURGICAL SUPPLY — 14 items

## 2019-03-06 NOTE — Discharge Instructions (Signed)
YOU HAD AN ENDOSCOPIC PROCEDURE TODAY: Refer to the procedure report and other information in the discharge instructions given to you for any specific questions about what was found during the examination. If this information does not answer your questions, please call [facility] office at [phone] to clarify.   YOU SHOULD EXPECT: Some feelings of bloating in the abdomen. Passage of more gas than usual. Walking can help get rid of the air that was put into your GI tract during the procedure and reduce the bloating. If you had a lower endoscopy (such as a colonoscopy or flexible sigmoidoscopy) you may notice spotting of blood in your stool or on the toilet paper. Some abdominal soreness may be present for a day or two, also.  DIET: Your first meal following the procedure should be a light meal and then it is ok to progress to your normal diet. A half-sandwich or bowl of soup is an example of a good first meal. Heavy or fried foods are harder to digest and may make you feel nauseous or bloated. Drink plenty of fluids but you should avoid alcoholic beverages for 24 hours. If you had a esophageal dilation, please see attached instructions for diet.   ACTIVITY: Your care partner should take you home directly after the procedure. You should plan to take it easy, moving slowly for the rest of the day. You can resume normal activity the day after the procedure however YOU SHOULD NOT DRIVE, use power tools, machinery or perform tasks that involve climbing or major physical exertion for 24 hours (because of the sedation medicines used during the test).   SYMPTOMS TO REPORT IMMEDIATELY: A gastroenterologist can be reached at any hour. Please call [phone]  for any of the following symptoms:   Following upper endoscopy (EGD, EUS, ERCP, esophageal dilation) Vomiting of blood or coffee ground material  New, significant abdominal pain  New, significant chest pain or pain under the shoulder blades  Painful or  persistently difficult swallowing  New shortness of breath  Black, tarry-looking or red, bloody stools  FOLLOW UP:  If any biopsies were taken you will be contacted by phone or by letter within the next 1-3 weeks. Call [phone]  if you have not heard about the biopsies in 3 weeks.  Please also call with any specific questions about appointments or follow up tests. Call if question or problem otherwise follow-up as needed and call if repeat therapy is needed in the future and have a soft diet today to start with

## 2019-03-06 NOTE — Op Note (Signed)
[facility] Patient Name: Valerie [name] Procedure Date: 03/06/2019 MRN: [id number] Attending MD: Clarene Essex , MD Date of Birth: 1960/10/13 CSN: [id number] Age: 58 Admit Type: Outpatient Procedure:                Upper GI endoscopy Indications:              For therapy of gastroparesis Providers:                Clarene Essex, MD, Cleda Daub, RN, Cherylynn Ridges,                            Technician, Lina Sar, Technician, Karis Juba, CRNA Referring MD:              Medicines:                Propofol total dose 250 mg IV, 100 mg lidocaine Complications:            No immediate complications. Estimated Blood Loss:     Estimated blood loss: none. Procedure:                Pre-Anesthesia Assessment:                           - Prior to the procedure, a History and Physical                            was performed, and patient medications and                            allergies were reviewed. The patient's tolerance of                            previous anesthesia was also reviewed. The risks                            and benefits of the procedure and the sedation                            options and risks were discussed with the patient.                            All questions were answered, and informed consent                            was obtained. Prior Anticoagulants: The patient has                            taken no previous anticoagulant or antiplatelet                            agents. ASA Grade Assessment: III - A patient with  severe systemic disease. After reviewing the risks                            and benefits, the patient was deemed in                            satisfactory condition to undergo the procedure.                           After obtaining informed consent, the endoscope was                            passed under direct vision. Throughout the   procedure, the patient's blood pressure, pulse, and                            oxygen saturations were monitored continuously. The                            [id number]) Olympus gastroscope was                            introduced through the mouth, and advanced to the                            third part of duodenum. The upper GI endoscopy was                            accomplished without difficulty. The patient                            tolerated the procedure well. Scope In: Scope Out: Findings:      The larynx was normal.      The examined esophagus was normal.      The entire examined stomach was normal. Area was successfully injected       with 100 units botulinum toxin.      The duodenal bulb, first portion of the duodenum, second portion of the       duodenum and third portion of the duodenum were normal.      The exam was otherwise without abnormality. Impression:               - Normal larynx.                           - Normal esophagus.                           - Normal stomach. Injected with botulinum toxin.                           - Normal duodenal bulb, first portion of the                            duodenum, second portion of the duodenum and third  portion of the duodenum.                           - The examination was otherwise normal.                           - No specimens collected. Moderate Sedation:      Not Applicable - Patient had care per Anesthesia. Recommendation:           - Patient has a contact number available for                            emergencies. The signs and symptoms of potential                            delayed complications were discussed with the                            patient. Return to normal activities tomorrow.                            Written discharge instructions were provided to the                            patient.                           - Soft diet today.                            - Continue present medications.                           - Return to GI clinic PRN.                           - Telephone GI clinic if symptomatic PRN. Procedure Code(s):        --- Professional ---                           [phone], Esophagogastroduodenoscopy, flexible,                            transoral; with directed submucosal injection(s),                            any substance Diagnosis Code(s):        --- Professional ---                           K31.84, Gastroparesis CPT copyright 2019 [employer] Association. All rights reserved. The codes documented in this report are preliminary and upon coder review may  be revised to meet current compliance requirements. Clarene Essex, MD 03/06/2019 2:44:27 PM This report has been signed electronically. Number of Addenda: 0

## 2019-03-06 NOTE — Progress Notes (Signed)
Valerie [name] 2:02 PM  Subjective: Patient says that since November her gastroparesis symptoms have recurred but because of the virus she has not wanted to come in and have the procedure and she says her injections usually last a year and she has not had any other medical problems and is not on any blood thinners and has no other complaints  Objective: Vital signs stable afebrile exam please see preassessment evaluation recent CT reviewed  Assessment: Gastroparesis helped by Botox in the past  Plan: Okay to proceed with endoscopy and probable Botox of pylorus with anesthesia assistance  [facility]  office [phone] After 5PM or if no answer call [phone]

## 2019-03-06 NOTE — Anesthesia Preprocedure Evaluation (Signed)
Anesthesia Evaluation  Patient identified by MRN, date of birth, ID band Patient awake    Reviewed: Allergy & Precautions, H&P , NPO status , Patient's Chart, lab work & pertinent test results  Airway Mallampati: II   Neck ROM: full    Dental   Pulmonary shortness of breath, asthma , sleep apnea ,    breath sounds clear to auscultation       Cardiovascular hypertension, +CHF   Rhythm:regular Rate:Normal     Neuro/Psych  Headaches, PSYCHIATRIC DISORDERS Anxiety Depression  Neuromuscular disease    GI/Hepatic GERD  ,  Endo/Other    Renal/GU Renal InsufficiencyRenal disease     Musculoskeletal  (+) Arthritis , Fibromyalgia -  Abdominal   Peds  Hematology   Anesthesia Other Findings   Reproductive/Obstetrics                             Anesthesia Physical Anesthesia Plan  ASA: III  Anesthesia Plan: MAC   Post-op Pain Management:    Induction: Intravenous  PONV Risk Score and Plan: 2 and Propofol infusion  Airway Management Planned: Nasal Cannula  Additional Equipment:   Intra-op Plan:   Post-operative Plan:   Informed Consent: I have reviewed the patients History and Physical, chart, labs and discussed the procedure including the risks, benefits and alternatives for the proposed anesthesia with the patient or authorized representative who has indicated his/her understanding and acceptance.       Plan Discussed with: CRNA, Anesthesiologist and Surgeon  Anesthesia Plan Comments:         Anesthesia Quick Evaluation

## 2019-03-06 NOTE — Transfer of Care (Signed)
Immediate Anesthesia Transfer of Care Note  Patient: Valerie [name]  Procedure(s) Performed: ESOPHAGOGASTRODUODENOSCOPY (EGD) WITH PROPOFOL (N/A )  Patient Location: PACU and Endoscopy Unit  Anesthesia Type:MAC  Level of Consciousness: awake, alert , oriented and patient cooperative  Airway & Oxygen Therapy: Patient Spontanous Breathing and Patient connected to face mask oxygen  Post-op Assessment: Report given to RN and Post -op Vital signs reviewed and stable  Post vital signs: Reviewed and stable  Last Vitals:  Vitals Value Taken Time  BP 111/59 03/06/19 1447  Temp 36.6 C 03/06/19 1447  Pulse 61 03/06/19 1447  Resp 11 03/06/19 1447  SpO2 100 % 03/06/19 1447    Last Pain:  Vitals:   03/06/19 1447  TempSrc: Oral  PainSc:          Complications: No apparent anesthesia complications

## 2019-03-06 NOTE — Anesthesia Procedure Notes (Addendum)
Procedure Name: MAC Date/Time: 03/06/2019 2:26 PM Performed by: Garrel Ridgel, CRNA Pre-anesthesia Checklist: Patient identified, Emergency Drugs available, Suction available, Patient being monitored and Timeout performed Patient Re-evaluated:Patient Re-evaluated prior to induction Oxygen Delivery Method: Simple face mask Preoxygenation: Pre-oxygenation with 100% oxygen Induction Type: IV induction Placement Confirmation: positive ETCO2 and breath sounds checked- equal and bilateral Dental Injury: Teeth and Oropharynx as per pre-operative assessment

## 2019-03-07 ENCOUNTER — Encounter (HOSPITAL_COMMUNITY): Payer: Self-pay | Admitting: Gastroenterology

## 2019-03-07 NOTE — Anesthesia Postprocedure Evaluation (Signed)
Anesthesia Post Note  Patient: Valerie [name]  Procedure(s) Performed: ESOPHAGOGASTRODUODENOSCOPY (EGD) WITH PROPOFOL (N/A ) SUBMUCOSAL INJECTION     Patient location during evaluation: PACU Anesthesia Type: MAC Level of consciousness: awake and alert Pain management: pain level controlled Vital Signs Assessment: post-procedure vital signs reviewed and stable Respiratory status: spontaneous breathing, nonlabored ventilation, respiratory function stable and patient connected to nasal cannula oxygen Cardiovascular status: stable and blood pressure returned to baseline Postop Assessment: no apparent nausea or vomiting Anesthetic complications: no    Last Vitals:  Vitals:   03/06/19 1450 03/06/19 1501  BP: 113/69 122/85  Pulse: (!) 59 63  Resp: 15 15  Temp:    SpO2: 100% 99%    Last Pain:  Vitals:   03/07/19 1044  TempSrc:   PainSc: 0-No pain                 [name] S

## 2019-03-11 DIAGNOSIS — K3184 Gastroparesis: Secondary | ICD-10-CM | POA: Diagnosis not present

## 2019-03-12 DIAGNOSIS — F332 Major depressive disorder, recurrent severe without psychotic features: Secondary | ICD-10-CM | POA: Diagnosis not present

## 2019-03-21 DIAGNOSIS — H43811 Vitreous degeneration, right eye: Secondary | ICD-10-CM | POA: Diagnosis not present

## 2019-03-24 DIAGNOSIS — Z23 Encounter for immunization: Secondary | ICD-10-CM | POA: Diagnosis not present

## 2019-03-24 DIAGNOSIS — H43811 Vitreous degeneration, right eye: Secondary | ICD-10-CM | POA: Diagnosis not present

## 2019-03-26 DIAGNOSIS — F4310 Post-traumatic stress disorder, unspecified: Secondary | ICD-10-CM | POA: Diagnosis not present

## 2019-03-26 DIAGNOSIS — Z1231 Encounter for screening mammogram for malignant neoplasm of breast: Secondary | ICD-10-CM | POA: Diagnosis not present

## 2019-03-26 DIAGNOSIS — I10 Essential (primary) hypertension: Secondary | ICD-10-CM | POA: Diagnosis not present

## 2019-03-26 DIAGNOSIS — I5032 Chronic diastolic (congestive) heart failure: Secondary | ICD-10-CM | POA: Diagnosis not present

## 2019-03-26 DIAGNOSIS — Z0000 Encounter for general adult medical examination without abnormal findings: Secondary | ICD-10-CM | POA: Diagnosis not present

## 2019-03-26 DIAGNOSIS — N1831 Chronic kidney disease, stage 3a: Secondary | ICD-10-CM | POA: Diagnosis not present

## 2019-03-27 DIAGNOSIS — F332 Major depressive disorder, recurrent severe without psychotic features: Secondary | ICD-10-CM | POA: Diagnosis not present

## 2019-03-28 ENCOUNTER — Other Ambulatory Visit: Payer: Self-pay | Admitting: Physician Assistant

## 2019-03-28 DIAGNOSIS — Z1231 Encounter for screening mammogram for malignant neoplasm of breast: Secondary | ICD-10-CM

## 2019-04-04 DIAGNOSIS — H04123 Dry eye syndrome of bilateral lacrimal glands: Secondary | ICD-10-CM | POA: Diagnosis not present

## 2019-04-14 DIAGNOSIS — H4311 Vitreous hemorrhage, right eye: Secondary | ICD-10-CM | POA: Diagnosis not present

## 2019-04-14 DIAGNOSIS — H33301 Unspecified retinal break, right eye: Secondary | ICD-10-CM | POA: Diagnosis not present

## 2019-04-14 DIAGNOSIS — H33311 Horseshoe tear of retina without detachment, right eye: Secondary | ICD-10-CM | POA: Diagnosis not present

## 2019-04-14 DIAGNOSIS — H43822 Vitreomacular adhesion, left eye: Secondary | ICD-10-CM | POA: Diagnosis not present

## 2019-04-15 DIAGNOSIS — H35361 Drusen (degenerative) of macula, right eye: Secondary | ICD-10-CM | POA: Diagnosis not present

## 2019-04-15 DIAGNOSIS — H43822 Vitreomacular adhesion, left eye: Secondary | ICD-10-CM | POA: Diagnosis not present

## 2019-04-15 DIAGNOSIS — H33311 Horseshoe tear of retina without detachment, right eye: Secondary | ICD-10-CM | POA: Diagnosis not present

## 2019-04-22 DIAGNOSIS — H33311 Horseshoe tear of retina without detachment, right eye: Secondary | ICD-10-CM | POA: Diagnosis not present

## 2019-04-24 DIAGNOSIS — F332 Major depressive disorder, recurrent severe without psychotic features: Secondary | ICD-10-CM | POA: Diagnosis not present

## 2019-04-30 DIAGNOSIS — M4726 Other spondylosis with radiculopathy, lumbar region: Secondary | ICD-10-CM | POA: Diagnosis not present

## 2019-04-30 DIAGNOSIS — M461 Sacroiliitis, not elsewhere classified: Secondary | ICD-10-CM | POA: Diagnosis not present

## 2019-04-30 DIAGNOSIS — G894 Chronic pain syndrome: Secondary | ICD-10-CM | POA: Diagnosis not present

## 2019-04-30 DIAGNOSIS — Z79891 Long term (current) use of opiate analgesic: Secondary | ICD-10-CM | POA: Diagnosis not present

## 2019-04-30 DIAGNOSIS — M79601 Pain in right arm: Secondary | ICD-10-CM | POA: Diagnosis not present

## 2019-05-07 DIAGNOSIS — F332 Major depressive disorder, recurrent severe without psychotic features: Secondary | ICD-10-CM | POA: Diagnosis not present

## 2019-05-12 DIAGNOSIS — F332 Major depressive disorder, recurrent severe without psychotic features: Secondary | ICD-10-CM | POA: Diagnosis not present

## 2019-05-13 DIAGNOSIS — H4311 Vitreous hemorrhage, right eye: Secondary | ICD-10-CM | POA: Diagnosis not present

## 2019-05-13 DIAGNOSIS — H33311 Horseshoe tear of retina without detachment, right eye: Secondary | ICD-10-CM | POA: Diagnosis not present

## 2019-05-21 DIAGNOSIS — Z124 Encounter for screening for malignant neoplasm of cervix: Secondary | ICD-10-CM | POA: Diagnosis not present

## 2019-05-21 DIAGNOSIS — I10 Essential (primary) hypertension: Secondary | ICD-10-CM | POA: Diagnosis not present

## 2019-05-21 DIAGNOSIS — N1831 Chronic kidney disease, stage 3a: Secondary | ICD-10-CM | POA: Diagnosis not present

## 2019-05-21 DIAGNOSIS — N841 Polyp of cervix uteri: Secondary | ICD-10-CM | POA: Diagnosis not present

## 2019-05-26 ENCOUNTER — Other Ambulatory Visit: Payer: Self-pay

## 2019-05-26 ENCOUNTER — Ambulatory Visit
Admission: RE | Admit: 2019-05-26 | Discharge: 2019-05-26 | Disposition: A | Discharge status: Home / Self Care | Payer: Medicare Other | Source: Ambulatory Visit | Attending: Physician Assistant | Admitting: Physician Assistant

## 2019-05-26 DIAGNOSIS — Z1231 Encounter for screening mammogram for malignant neoplasm of breast: Secondary | ICD-10-CM

## 2019-05-29 DIAGNOSIS — F332 Major depressive disorder, recurrent severe without psychotic features: Secondary | ICD-10-CM | POA: Diagnosis not present

## 2019-08-06 ENCOUNTER — Other Ambulatory Visit: Payer: Self-pay | Admitting: Neurology

## 2019-08-07 ENCOUNTER — Other Ambulatory Visit: Payer: Self-pay | Admitting: Neurology

## 2019-08-07 MED ORDER — MODAFINIL 200 MG PO TABS: ORAL_TABLET | ORAL | 0 refills | Status: DC

## 2019-09-29 IMAGING — CR DG CHEST 2V
2 series · 2 of 2 positions shown · non-contrast
Comparison: Prior radiograph from 11/18/2016.

CLINICAL DATA: Initial evaluation for acute shortness of breath.

EXAM:
CHEST - 2 VIEW

[w chest lat]
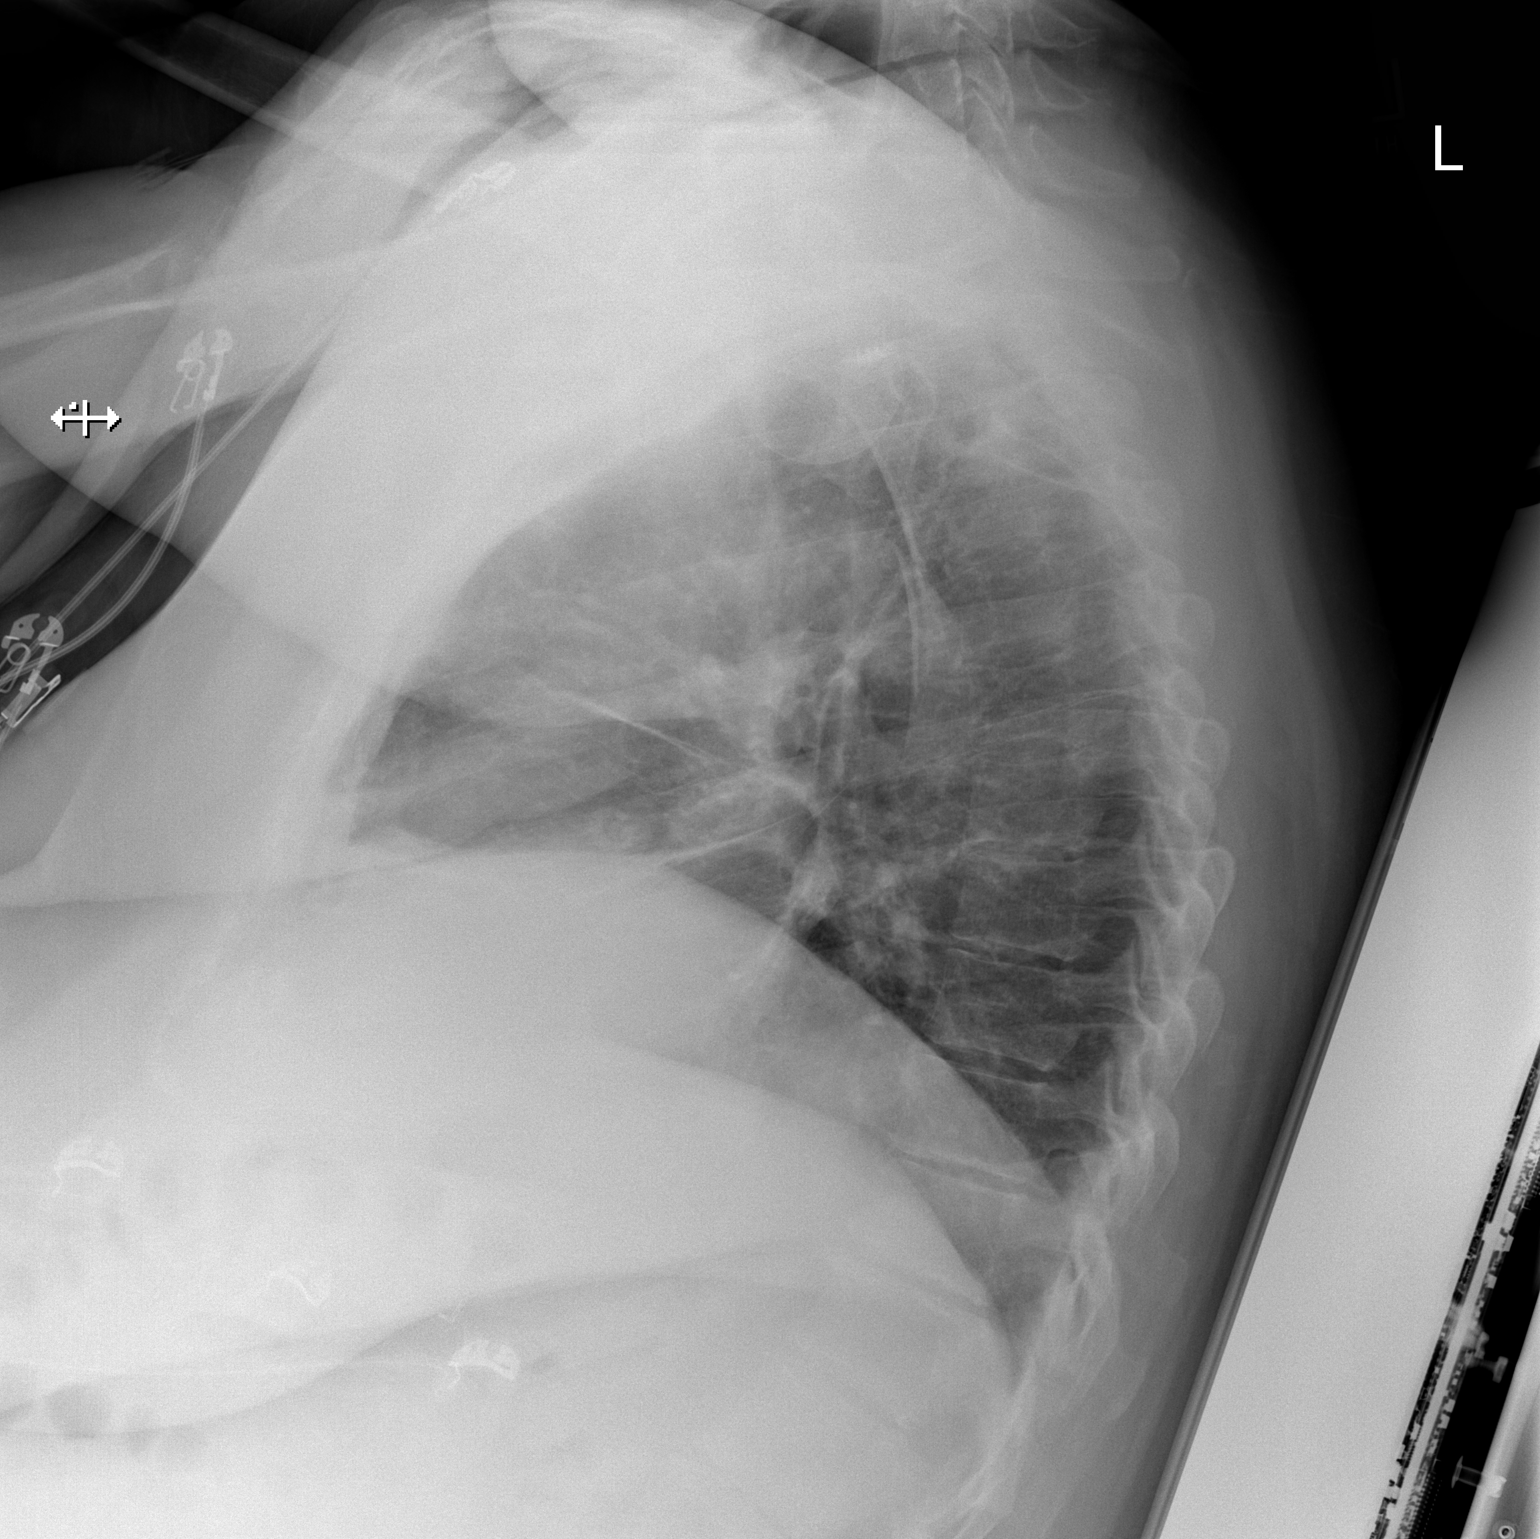

[x chest ap]
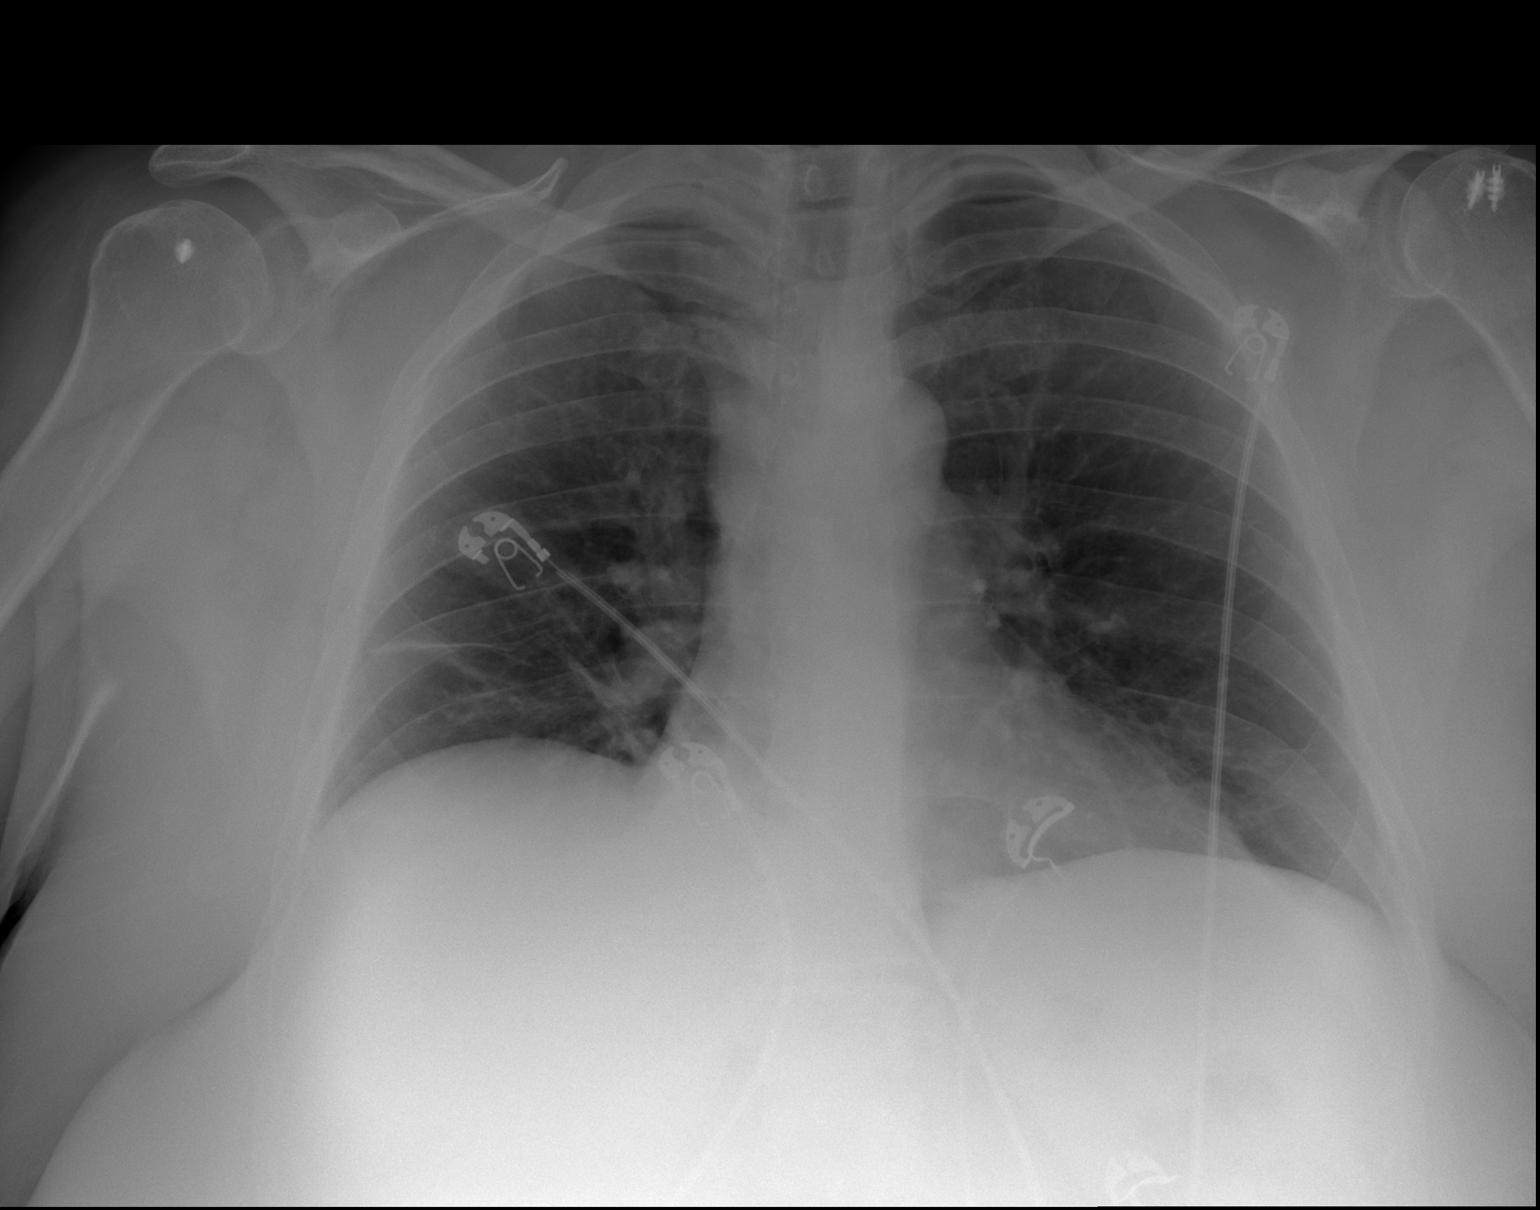

[2 of 2 positions shown; findings below may reference images not displayed]

FINDINGS: Cardiac and mediastinal silhouettes are stable in size and contour,
and remain within normal limits.

Lungs are hypoinflated. Scattered linear bibasilar opacities most
consistent with atelectasis. No focal infiltrates. No pulmonary
edema or pleural effusion. No pneumothorax.

No acute osseous abnormality.
IMPRESSION: 1. Shallow lung inflation with associated bibasilar atelectasis.
2. No other active cardiopulmonary disease.

## 2019-10-14 ENCOUNTER — Telehealth (INDEPENDENT_AMBULATORY_CARE_PROVIDER_SITE_OTHER): Payer: Medicare Other | Admitting: Family Medicine

## 2019-10-14 ENCOUNTER — Encounter: Payer: Self-pay | Admitting: Family Medicine

## 2019-10-14 DIAGNOSIS — G4733 Obstructive sleep apnea (adult) (pediatric): Secondary | ICD-10-CM | POA: Diagnosis not present

## 2019-10-14 DIAGNOSIS — G4719 Other hypersomnia: Secondary | ICD-10-CM

## 2019-10-14 DIAGNOSIS — Z9989 Dependence on other enabling machines and devices: Secondary | ICD-10-CM

## 2019-10-14 NOTE — Progress Notes (Signed)
PATIENT: Valerie [name] DOB: January 27, 1961  REASON FOR VISIT: follow up HISTORY FROM: patient  Virtual Visit via Telephone Note  I connected with Valerie [name] on 10/14/19 at 11:00 AM EDT by telephone and verified that I am speaking with the correct person using two identifiers.   I discussed the limitations, risks, security and privacy concerns of performing an evaluation and management service by telephone and the availability of in person appointments. I also discussed with the patient that there may be a patient responsible charge related to this service. The patient expressed understanding and agreed to proceed.   History of Present Illness:  10/14/19 Valerie [name] is a 59 y.o. female here today for follow up for OSA on CPAP. She is doing very well on CPAP therapy. She is using therapy every night. She continues modafinil 120m every morning and 1011min the afternoons as needed. Last refill for 90 tablets on 08/07/2019. She tried to wean this medication a few months ago but daytime sleepiness was much worse. She denies adverse effects. She is followed by PCP closely.   Compliance report dated 09/13/2019 through 10/12/2019 reveals that she use CPAP every night for compliance of 100%.  She used CPAP greater than 4 hours every night for compliance of 100%.  Average usage was 8 hours and 57 minutes.  Residual AHI was 3.2 on 5 to 15 cm of water and an EPR of 2.  There was no significant leak noted.   History (copied from my note on 10/14/2018)  Valerie [name] a [age].o. female for follow up.  She is using CPAP nightly.  She reports that her sleep patterns have been off for the last month but otherwise she is doing okay.  Compliance data as follows:  09/10/2018 - 10/09/2018 020 - 10/09/2018 Usage days 29/30 days (97%) >= 4 hours 27 days (90%) < 4 hours 2 days (7%) Usage hours 240 hours 17 minutes Average usage (total days) 8 hours 1 minutes Average usage (days used) 8 hours  17 minutes Median usage (days used) 8 hours 33 minutes Total used hours (value since last reset - 10/09/2018) 7,959 hours AirSense 10 AutoSet Serial number [id number] AutoSet Min Pressure 5 cmH2O Max Pressure 15 cmH2O EPR Fulltime EPR level 2 Response Standard Therapy Pressure - cmH2O Median: 9.0 95th percentile: 11.6 Maximum: 13.0 Leaks - L/min Median: 7.6 95th percentile: 23.0 Maximum: 43.3 Events per hour AI: 2.5 HI: 0.3 AHI: 2.8 Apnea Index Central: 0.7 Obstructive: 1.5 Unknown: 0.2 RERA Index 0.3 Cheyne-Stokes respiration (average duration per night) 0 minutes (0%)   History (copied from Dr DoEdwena Feltyote on 10/10/2017)  This patient's primary neurologist is Dr. KeFloyde Parkins  Interval history from 10/11/2016. 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 She had just been discharged after a prolonged hospitalization On April 14 th - 21 2018, for bleeding. and in the meantime has actually learned her diagnosis of microscopic polyangiitis. This explains her renal disorder, fatigue, GI bleeds and overall pain. Her rheumatologist just started her on methotrexate. She continues now to use CPAP endorsed the Epworth sleepiness score at 11 points fatigue severity at 48 points, she is highly compliant patient uses the machine 93% of the last 30 days with an average of 8 hours and 1 minute, the machine is an AutoSet between 5 and 15 cm water pressure with 2 cm expiratory  pressure relief. Residual AHI is 2.0 pressure at the 91st percentile is 10 cm water she does have mild to moderate air leaks.    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 Joseph Art for OSA , diagnosed after a sleep study on 12-22-2008 at [facility]. She underwent a SPLIT study 02-18-13  here at [place] . The patient underwent a retitration here at [facility] lab on 9-9 2014, with Dr. Joseph Art listed as the referring physician.  She could not tolerate the CPAP settings that she had moved here with from [place] in early 2010.  At the time at [facility] she was titrated to 9 cm water- now for night use later re\re titration resulted in a pressure of 20 and an AHI of 20.6 RDI of 33.8 REM AHI of 21.2. There was no tachybradycardia arrhythmia and altered no prolonged desaturation altered, and no periodic limb movements seen. The patient was now actually titrated to 12 cm water she is now using a ResMed Mirage mask in a small size and uses heated humidity. The machine is the same she had 2010 .  Valerie [name] will need a prescription for the full facemask that she has used before and that she prefers using in times of nasal allergic rhinitis. She also will have cushions refilled for her nasal pillow mask. She reports that over the last week for 2 weeks she has become excessively daytime sleepy again currently she endorsed the Epworth sleepiness score on at 13 points and the fatigue severity score at 53-54 points. This is dated 08-19-14 Her sleep habits and social habits have not changed in any way to explain the increase in daytime sleepiness and fatigue. She takes Zyrtec in the morning during her allergy season which should not give her this degree of sleepiness. She had no infection or inflammatory disorders that she is aware of. He is easier short of breath and she does have some underlying wheezing which have been mentioned by Dr. Verline Lema. She never had wheezing before 2015 there of. This may be an asthmatic component and she has Proventil as an inhaler available .her voice in the choir is affected. Her endurance span is shorter.  Interval history from 12/30/2015. Valerie [name] was just released yesterday from a hospital stay necessitated by repeated loss of consciousness or  loss of awareness, interpreted as sleep attacks!!!. She was admitted with acute renal failure and was confused. She states she spent the evening at a friend's house where she seemed to lose awareness of her surroundings, she reports felt falling asleep in the dry for. And neighbor knocked on the window of her car and woke her up. There have been several of these spells on Saturday last. By Monday she was admitted to hospital. This is not related to a sleep disorder with a metabolic problem with acute renal failure. 1. Confusion 2. Drowsiness 3. Dehydration 4. Abnormal urinalysis - Will cover patient for possible pyelonephritis with IM ceftriaxone given UA. We will also obtain head CT to r/o intracranial process. Consider using ciprofloxacin to supplement ceftriaxone. Urine culture pending. Differential includes meningitis, adverse effects from multiple strong pain medications/CS. 5. Chronic pain syndrome - Counseled on use of controlled substances, patient insists that this is not a contributing factor. P ANCA Positive.    Interval history from 11 Oct 2017 I had the pleasure  of meeting with Mrs. Hatchell today who has been a long-standing CPAP user. Mrs. Traore has been compliant her download confirms 97% compliance with an average usage time of 8 hours and 10 minutes, she is using an AutoSet CPAP between 5 and 15 cmH2O is 2 cm expiratory pressure relief. Her residual AHI is only 2.5 there are no significant central apneas emergent, 95th percentile pressure is 11.3 cmH2O, well was in the pressure window was given. She does have some minor air leaks. She continues to be excessively daytime sleepy her Epworth score is between 10 and 12, she also struggles with fatigue and endorses fatigue severity score of 51 points. Last year she had tested positive for a P ANCA, a repeat test however was negative she was referred to [facility] rheumatology and was diagnosed with an HLA B 27, likely reflecting ankylosing  spondylitis.  She underwent ECHO cardiography, LV hypertrophy, normal EF. This may explain a lot of her joint pain and some of the residual fatigue as well. Autoimmune diseases all contribute to fatigue syndromes. Her persistent hypersomnia and fatigue do not bear the hallmarks of narcolepsy also she does struggle to stay awake and physically not active and mentally not stimulated. She does not endorse vivid dreams and rare, isolated sleep paralysis. She feels sometimes frozen in her joints, not a muscle weakness.   Observations/Objective:  Generalized: Well developed, in no acute distress  Mentation: Alert oriented to time, place, history taking. Follows all commands speech and language fluent   Assessment and Plan:  59 y.o. year old female  has a past medical history of Acute ischemic colitis ([place]), Anemia (05/01/2012), Anxiety, ARF (acute renal failure) ([place]) (12/28/2015), Arthritis, Asthma, Basal cell carcinoma of upper back (~ 2011), Chronic kidney disease (CKD) stage G2/A1, mildly decreased glomerular filtration rate (GFR) between 60-89 mL/min/1.73 square meter and albuminuria creatinine ratio less than 30 mg/g, Chronic right-sided headaches, Complete tear of right rotator cuff, recurrent (04/09/2018), DDD (degenerative disc disease), lumbar, Diarrhea, Fatigue, Fibromyalgia, First degree heart block, GERD (gastroesophageal reflux disease) (04/06/2017), Headache(784.0), History of ischemic colitis (09/24/2016), Hypertension, Mental disorder, Microscopic polyangiitis ([place]), Nerve pain (2011), Other bursal cyst, right shoulder (04/09/2018), Pancreatitis (last time yrs ago), Pneumonia (~ 2014; 08/2015), Polymyalgia rheumatica ([place]) (onset 08/2015), 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 here with    ICD-10-CM   1. OSA on CPAP  G47.33 For home use only DME continuous positive airway pressure (CPAP)   Z99.89   2. Excessive daytime sleepiness  G47.19      Chong Sicilian is doing very well with  CPAP therapy at home.  Compliance report reveals excellent compliance.  She was encouraged to continue using CPAP nightly and for greater than 4 hours each night.  We will update supply orders today.  She will continue modafinil 18m in am and 1060min afternoons as needed. She is current on refills at this time. She was encouraged to continue working on healthy lifestyle habits and stay well-hydrated.  She will follow-up with me in 1 year, sooner if needed.  She verbalizes understanding and agreement with this plan.  Orders Placed This Encounter  Procedures  . For home use only DME continuous positive airway pressure (CPAP)    Supplies    Order Specific Question:   Length of Need    Answer:   [employer]    Order Specific Question:   Patient has OSA or probable OSA    Answer:   Yes    Order Specific Question:   Is  the patient currently using CPAP in the home    Answer:   Yes    Order Specific Question:   Settings    Answer:   Other see comments    Order Specific Question:   CPAP supplies needed    Answer:   Mask, headgear, cushions, filters, heated tubing and water chamber    No orders of the defined types were placed in this encounter.    Follow Up Instructions:  I discussed the assessment and treatment plan with the patient. The patient was provided an opportunity to ask questions and all were answered. The patient agreed with the plan and demonstrated an understanding of the instructions.   The patient was advised to call back or seek an in-person evaluation if the symptoms worsen or if the condition fails to improve as anticipated.  I provided 15 minutes of non-face-to-face time during this encounter.  Patient is located at her place of residence during my chart visit.  Providers in the office.   Debbora Presto, NP

## 2019-10-14 NOTE — Progress Notes (Signed)
Order for Cpap supplies sent to [facility] via [facility]. Confirmation received that the order transmitted was successful.

## 2019-12-06 ENCOUNTER — Other Ambulatory Visit: Payer: Self-pay | Admitting: Neurology

## 2020-11-09 NOTE — Progress Notes (Signed)
Asked to evaluated after fall in the pool area, limping with L medial/inner knee redness/rash and lateral L lower extremity tightness/pain radiating from calf to ankle.No deformity, good CMS. Reports she plans to go home, elevate with ice, knows to contact her MD for any complications as this is a re-injury to that leg.

## 2021-02-07 ENCOUNTER — Encounter: Payer: Self-pay | Admitting: Neurology

## 2021-02-07 ENCOUNTER — Ambulatory Visit: Payer: Medicare HMO | Admitting: Neurology

## 2021-02-07 VITALS — BP 127/82 | HR 66 | Ht 65.0 in | Wt 300.0 lb

## 2021-02-07 DIAGNOSIS — R768 Other specified abnormal immunological findings in serum: Secondary | ICD-10-CM

## 2021-02-07 DIAGNOSIS — G4733 Obstructive sleep apnea (adult) (pediatric): Secondary | ICD-10-CM | POA: Diagnosis not present

## 2021-02-07 DIAGNOSIS — Z9989 Dependence on other enabling machines and devices: Secondary | ICD-10-CM

## 2021-02-07 DIAGNOSIS — G4710 Hypersomnia, unspecified: Secondary | ICD-10-CM | POA: Diagnosis not present

## 2021-02-07 DIAGNOSIS — G4719 Other hypersomnia: Secondary | ICD-10-CM

## 2021-02-07 MED ORDER — MODAFINIL 200 MG PO TABS: ORAL_TABLET | ORAL | 2 refills | Status: DC

## 2021-02-07 NOTE — Progress Notes (Signed)
[facility]  Provider:  Dr Valerie [name] Referring Provider: Harrison [name], [place] Primary Care Physician:  Valerie Coffin, MD  Chief Complaint  Patient presents with   Obstructive Sleep Apnea    Rm 11, alone. Here for CPAP f/u, last seen 10/14/19. Pt reports doing well w/ her cpap machine. Pt is still have daytime sleepiness, even though she's sleeping well. Tends to happen when sitting still and not doing anything. Pt would like cpap supplies to [place] [facility]    This patient's primary neurologist was Dr. Floyde [name], who will now retire.  02-07-2021: RV Interval history - the patient was diagnosed in 2021 with DM, she uses Insulin. She has had gastroparesis, had pancreatic surgery. Autoimmune diseases, p-ANCA, with numerous joint 'freezes'.  MTX was discontinued after one year, sees rheumatologist at [facility].  HLA B 27 positive . Broke a leg May 31 st 2022, she  had a "controlled fall" at the pool.  She continuous to gain weight, not loosing it.  Had retinal tears and recently had cataract surgery. She reports having excessive daytime sleepiness, some days , but then severe. Provigil prn.  Her original sleep study was in [place], [place]- now here followed since 2014.  She had a new machine in 7- 2017, while having acute renal failure. That was the time of her last CPAP - replacement. I ordered this new machine as an auto-titration device, but she was in emergency need when it broke down- was [employer]  She uses it 100% compliantly.       Interval history from 10/11/2016. 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 She had just been discharged after a prolonged hospitalization  On April 14 th - 21 2018, for bleeding. and in the meantime has actually learned her diagnosis of microscopic polyangiitis. This explains her renal disorder, fatigue, GI bleeds and overall pain. Her rheumatologist just  started her on methotrexate. She continues now to use CPAP endorsed the Epworth sleepiness score at 11 points fatigue severity at 48 points, she is highly compliant patient uses the machine 93% of the last 30 days with an average of 8 hours and 1 minute, the machine is an AutoSet between 5 and 15 cm water pressure with 2 cm expiratory pressure relief. Residual AHI is 2.0 pressure at the 91st percentile is 10 cm water she does have mild to moderate air leaks.      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 Joseph [name] for  OSA , diagnosed after a  sleep study on  12-22-2008 at [facility]. She underwent a SPLIT study 02-18-13  here at [place] . The patient underwent a retitration here at [facility] lab  on 9-9 2014,  with Dr. Joseph [name] listed as the referring physician.  She could not tolerate the CPAP settings that she had moved here with from [place] in early 2010.  At the time at [facility] she was titrated to 9 cm water- now for night use later re\re titration resulted in a pressure of 20 and an AHI of 20.6 RDI of 33.8 REM AHI of 21.2. There was no tachybradycardia arrhythmia and altered no prolonged desaturation altered, and no periodic limb movements seen. The patient was now actually titrated to  12 cm water she is now using a ResMed Mirage mask in a small size and uses heated humidity. The machine is the same she had 2010 .  Valerie [name] will need a prescription for the full facemask that she has used before and that she prefers using in times of nasal allergic rhinitis. She also will have cushions refilled for her nasal pillow mask. She reports that over the last week for 2 weeks she has become excessively daytime sleepy again currently she endorsed the Epworth sleepiness score on at 13 points and the fatigue severity score at 53-54 points. This is dated  08-19-14     Her sleep habits and social habits have not changed in any way to explain the increase in daytime sleepiness and fatigue. She takes Zyrtec in the morning during her allergy season which should not give her this degree of sleepiness. She had no infection or inflammatory disorders that she is aware of. He is easier short of breath and she does have some underlying wheezing which have been mentioned by Dr. Verline [name]. She never had wheezing before 2015 there of. This may be an asthmatic component and she has Proventil as an inhaler available .her voice in the choir is affected. Her endurance span is shorter.  Interval history from 12/30/2015. Valerie [name] was just released yesterday from a hospital stay necessitated by repeated loss of consciousness or loss of awareness, interpreted as sleep attacks!!!. She was admitted with acute renal failure and was confused. She states she spent the evening at a friend's house where she seemed to lose awareness of her surroundings, she reports felt falling asleep in the dry for. And neighbor knocked on the window of her car and woke her up. There have been several of these spells on Saturday last. By Monday she was admitted to hospital. This is not related to a sleep disorder with a metabolic problem with acute renal failure. 1. Confusion 2. Drowsiness 3. Dehydration 4. Abnormal urinalysis - Will cover patient for possible pyelonephritis with IM ceftriaxone given UA. We will also obtain head CT to r/o intracranial process. Consider using ciprofloxacin to supplement ceftriaxone. Urine culture pending. Differential includes meningitis, adverse effects from multiple strong pain medications/CS. 5. Chronic pain syndrome - Counseled on use of controlled substances, patient insists that this is not a contributing factor. P ANCA  Positive.    Interval history from 11 Oct 2017 I had the pleasure of meeting with Valerie [name] today who has been a long-standing CPAP user.   Valerie [name] has been compliant her download confirms 97% compliance with an average usage time of 8 hours and 10 minutes, she is using an AutoSet CPAP between 5 and 15 cmH2O is 2 cm expiratory pressure relief.  Her residual AHI is only 2.5 there are no significant central apneas emergent, 95th percentile pressure is 11.3 cmH2O, well was in the pressure window was given.  She does have some minor air leaks.  She continues to be excessively daytime sleepy her Epworth score is between 10 and 12, she also struggles with fatigue and endorses fatigue severity score of 51 points.  Last year she had tested positive for a P ANCA, a repeat test however was negative she was referred to [facility] rheumatology and was diagnosed with an HLA B 27, likely reflecting ankylosing spondylitis.  She underwent ECHO cardiography, LV hypertrophy, normal EF.   This may explain a lot of her joint pain and some of the residual fatigue as well.  Autoimmune  diseases all contribute to fatigue syndromes.  Her persistent hypersomnia and fatigue do not bear the hallmarks of narcolepsy also she does struggle to stay awake and physically not active and mentally not stimulated. She does not endorse vivid dreams and rare, isolated  sleep paralysis. She feels sometimes frozen in her joints, not a muscle weakness.    ROS , her Epworth score and her fatigue severity have not changed over the last 18 months. Patients with renal failure are still allowed to take modafinil as it is a hepatic metabolized medication. The same is true for her sleep and pain medication. Epworth 11,  FSS 48 ,  she is using Provigil with some success .  She is taking morphine 60 mg 3 times a day but she takes it only once a day and has been monitored by Dr. Verline [name]. Baclofen does help very little, makes her more sleepy. She is actively followed by pain clinic.Intercostal pain, spasms , joint pain.   How likely are you to doze in the following situations: 0 = not likely, 1  = slight chance, 2 = moderate chance, 3 = high chance  Sitting and Reading? Watching Television? Sitting inactive in a public place (theater or meeting)? Lying down in the afternoon when circumstances permit? Sitting and talking to someone? Sitting quietly after lunch without alcohol? In a car, while stopped for a few minutes in traffic? As a passenger in a car for an hour without a break?  Total = 14 points, FSS 45 points.    Current Outpatient Medications  Medication Sig Dispense Refill   albuterol (PROAIR HFA) 108 (90 Base) MCG/ACT inhaler Inhale 2 puffs into the lungs every 4 (four) hours as needed for wheezing or shortness of breath. 8.5 each 11   allopurinol (ZYLOPRIM) 300 MG tablet TAKE 1/2 TABLET BY MOUTH ONCE A DAY (Patient taking differently: Take 150 mg by mouth daily.) 45 tablet 0   ALPRAZolam (XANAX) 1 MG tablet Take 1 tablet (1 mg total) by mouth 3 (three) times daily as needed. for anxiety 90 tablet 1   baclofen (LIORESAL) 10 MG tablet Take 10 mg by mouth 3 (three) times daily as needed for muscle spasms.      benazepril (LOTENSIN) 40 MG tablet Take 1 tablet (40 mg total) by mouth daily. 90 tablet 1   CARAFATE 1 GM/10ML suspension TAKE 10 ML'S BY MOUTH FOUR TIMES A DAY AS NEEDED FOR STOMACH PAIN (Patient taking differently: Take 1 g by mouth 3 (three) times daily as needed (burning in esophagus).) 420 mL 1   cetirizine (ZYRTEC) 10 MG tablet Take 10 mg by mouth daily.      Cholecalciferol (VITAMIN D3) 5000 units CAPS Take 5,000 Units by mouth daily.      dexlansoprazole (DEXILANT) 60 MG capsule Take 60 mg by mouth daily.      fluticasone (FLONASE) 50 MCG/ACT nasal spray PLACE 2 SPRAYS INTO BOTH NOSTRILS DAILY 48 g 1   furosemide (LASIX) 20 MG tablet Take 20 mg by mouth every other day as needed (fluid retention.).     HYDROcodone-acetaminophen (NORCO) 10-325 MG per tablet Take 1-2 tablets by mouth every 6 (six) hours as needed for moderate pain (pain.).      hydrOXYzine  (ATARAX/VISTARIL) 10 MG tablet Take 1-3 tablets (10-30 mg total) by mouth 3 (three) times daily as needed for anxiety. 180 tablet 1   ibuprofen (ADVIL,MOTRIN) 600 MG tablet Take 600 mg by mouth daily as needed (migraines).     Insulin  Glargine (BASAGLAR KWIKPEN) 100 UNIT/ML at bedtime as needed.     insulin regular (NOVOLIN R) 100 units/mL injection as directed. CHECK GLUCOSE EVERY 4 HOURS. IF GLUCOSE  <150 GIVE NO INSULIN; IF 150-199 GIVE 2 UNITS [place]; IF 200-249 GIVE 4 UNITS; IF 250-299 GIVE 6 UNITS; IF 300-349 GIVE 8 UNITS; IF 350-399 GIVE 10 UNITS; IF 400 AND ABOVE GIVE 12 UNITS     ipratropium (ATROVENT) 0.03 % nasal spray PLACE 2 SPRAYS INTO THE AFFECTED NOSTRIL(S) 4 TIMES A DAY (Patient taking differently: Place 2 sprays into both nostrils 4 (four) times daily as needed (congestion.).) 30 mL 0   Melatonin 5 MG TABS Take 5-10 mg by mouth at bedtime as needed (sleep).     metoprolol succinate (TOPROL-XL) 100 MG 24 hr tablet Take 1 tablet (100 mg total) by mouth daily. Take with or immediately following a meal. 90 tablet 3   modafinil (PROVIGIL) 200 MG tablet TAKE 1/2 TABLET BY MOUTH TWO TIMES A DAY 90 tablet 2   montelukast (SINGULAIR) 10 MG tablet TAKE ONE TABLET BY MOUTH EVERY NIGHT AT BEDTIME (Patient taking differently: Take 10 mg by mouth at bedtime.) 90 tablet 1   morphine (MS CONTIN) 30 MG 12 hr tablet Take 30 mg by mouth 3 (three) times daily. Not from [facility]     Multiple Vitamins-Minerals (MULTIVITAMIN PO) Take 1 tablet by mouth daily.     Olopatadine HCl (PAZEO) 0.7 % SOLN Apply 1 drop to eye daily as needed (allergies). 1 Bottle 5   ondansetron (ZOFRAN) 4 MG tablet Take 1 tablet (4 mg total) by mouth every 8 (eight) hours as needed for nausea or vomiting. 20 tablet 0   Pancrelipase, Lip-Prot-Amyl, 24000-76000 units CPEP Take 24,000-72,000 Units by mouth 3 (three) times daily as needed (for pancreatitis).      promethazine (PHENERGAN) 25 MG tablet Take 1 tablet (25 mg total) by mouth every 6  (six) hours as needed for nausea or vomiting. 30 tablet 1   spironolactone (ALDACTONE) 100 MG tablet Take 1 tablet (100 mg total) by mouth daily. (Patient not taking: No sig reported) 90 tablet 1   No current facility-administered medications for this visit.    Allergies as of 02/07/2021 - Review Complete 02/07/2021  Allergen Reaction Noted   Dilaudid [hydromorphone hcl] Other (See Comments) 08/01/2011   Effexor [venlafaxine]  03/09/2017   Pregabalin Other (See Comments) 08/01/2011   Adhesive [tape]  04/04/2018   Metoclopramide hcl Other (See Comments) 08/01/2011   Neurontin [gabapentin] Other (See Comments) 08/01/2011   Other Other (See Comments) 08/01/2011   Serotonin reuptake inhibitors (ssris) Other (See Comments) 11/18/2016      Vitals: BP 127/82   Pulse 66   Ht '5\' 5"'  (1.651 m)   Wt 300 lb (136.1 kg)   BMI 49.92 kg/m  Last Weight:  Wt Readings from Last 1 Encounters:  02/07/21 300 lb (136.1 kg)   Last Height:   Ht Readings from Last 1 Encounters:  02/07/21 '5\' 5"'  (1.651 m)   Vision Screening:  Vitals   Physical exam:    General: The patient is awake, alert and appears not in acute distress. The patient is well groomed. She is fatigued, but cooperative.  Head: Normocephalic, atraumatic.  Neck is supple. Large neck but low gradeupper airway restriction. Mallampati 2, narrow laterally , but high , peaked palate - short airway , reddened mucosa.   neck circumference:18. 5 inches! . Cardiovascular:  Regular rate and rhythm, and without distended neck veins. Respiratory:  Lungs are clear to auscultation. Skin:  Without evidence of edema, or rash Trunk: BMI is severely elevated ,BMI 50  Stooped  posture. morbidly obese.   Neurologic exam : The patient is awake and alert, oriented to place and time.  Memory subjective  described as intact. There is a normal attention span & concentration ability.  Speech is fluent without dysarthria, dysphonia or aphasia. Mood and  affect are appropriate.  Cranial nerves: Sense of smell is impaired , and she reported last year a metallic taste in her mouth. Tongue no longer " metallic "- recovered with the recovery from acute renal failure. . Pupils are equal and briskly reactive to light. Extraocular movements  in vertical and horizontal planes intact and without nystagmus. Visual fields by finger perimetry are intact.Hearing to finger rub intact. Facial sensation intact to fine touch. Facial motor strength is symmetric and tongue and uvula move midline.  Motor exam:   She reports pain in both legs with Valsalva!  Normal tone and normal muscle bulk and symmetric normal strength in all extremities. ROM limitation in  Right hip,  Left and right shoulder RO limited.  Sensory:  Fine touch and vibration were normal. Coordination: Rapid alternating movements in the fingers/hands is tested and normal.  Gait and station: Patient walks without assistive device and is able unassisted to climb up to the exam table. Deep tendon reflexes: in the  upper and lower extremities are symmetric and intact. Babinski maneuver response is downgoing.   Assessment:   After physical and neurologic examination, review of laboratory studies, imaging, neurophysiology testing and pre-existing records, assessment is that :   1) OSA- highly compliant with CPAP, residual AHI of 4.2. 95% pressure is 13.2 cm water, and settings are 5-15 cm 2 cm EPR. OSA treated , but fatigued and again excessively sleepy -  Fatigue is no related to OSA. May be inflammation, autoimmune reaction, DM.    2) HLA B 27 positive, suspected to have ankylosing spondylitis. Question : peripheral neuropathy, brachial plexus injury - seen by Dr. Jannifer Franklin. Not improved .  3) chronic pain , in pain therapy - non narcotics from [facility] - related to autoimmune disorder.    Plan: Valerie [name] endorsed the Epworth sleepiness score again at an elevated level today at 14 points, her fatigue  severity score is also very high at 42 points.  This in spite of good CPAP compliance.  What I have noticed is that she does not have significant air leakage but her 95th percentile pressure requirement is very close to the maximum the current settings can give her.  So for her I like to add 2 cm to the maximum pressure of currently 15 cm lifting it to 17 cm water.  I think this will cut down on the residual obstructive component.  In addition I would love to obtain a new baseline by home sleep test or in lab in the coming months she is doing for her new machine as hers now is 60 years old but I would like to establish a new baseline first.   Modafinil - prn   Visit 35 minutes with obtaining ED notes and [facility] progress notes, nephrology notes, discussion of newly diagnosed autoimmune disorder and rheumatology.     Pain, arthralgia and autoimmune diseases can cause colitis / renal disease,  I follow for OSA only,    Larey Seat, MD   Cc Dr Jannifer Franklin, Dr Valerie [name]

## 2021-02-07 NOTE — Patient Instructions (Signed)
Modafinil tablets What is this medication? MODAFINIL (moe DAF i nil) is used to treat excessive sleepiness caused by certain sleep disorders. This includes narcolepsy, sleep apnea, and shift worksleep disorder. This medicine may be used for other purposes; ask your health care provider orpharmacist if you have questions. COMMON BRAND NAME(S): Provigil What should I tell my care team before I take this medication? They need to know if you have any of these conditions: history of depression, mania, or other mental disorder kidney disease liver disease an unusual or allergic reaction to modafinil, other medicines, foods, dyes, or preservatives pregnant or trying to get pregnant breast-feeding How should I use this medication? Take this medicine by mouth with a glass of water. Follow the directions on the prescription label. Take your doses at regular intervals. Do not take your medicine more often than directed. Do not stop taking except on your doctor'sadvice. A special MedGuide will be given to you by the pharmacist with eachprescription and refill. Be sure to read this information carefully each time. Talk to your pediatrician regarding the use of this medicine in children. Thismedicine is not approved for use in children. Overdosage: If you think you have taken too much of this medicine contact apoison control center or emergency room at once. NOTE: This medicine is only for you. Do not share this medicine with others. What if I miss a dose? If you miss a dose, take it as soon as you can. If it is almost time for yournext dose, take only that dose. Do not take double or extra doses. What may interact with this medication? Do not take this medicine with any of the following medications: amphetamine or dextroamphetamine dexmethylphenidate or methylphenidate medicines called MAO Inhibitors like Nardil, Parnate, Marplan, Eldepryl pemoline procarbazine This medicine may also interact with the  following medications: antifungal medicines like itraconazole or ketoconazole barbiturates like phenobarbital birth control pills or other hormone-containing birth control devices or implants carbamazepine cyclosporine diazepam medicines for depression, anxiety, or psychotic disturbances phenytoin propranolol triazolam warfarin This list may not describe all possible interactions. Give your health care provider a list of all the medicines, herbs, non-prescription drugs, or dietary supplements you use. Also tell them if you smoke, drink alcohol, or use illegaldrugs. Some items may interact with your medicine. What should I watch for while using this medication? Visit your doctor or health care provider for regular checks on your progress.The full effects of this medicine may not be seen right away. This medicine may cause serious skin reactions. They can happen weeks to months after starting the medicine. Contact your health care provider right away if you notice fevers or flu-like symptoms with a rash. The rash may be red or purple and then turn into blisters or peeling of the skin. Or, you might notice a red rash with swelling of the face, lips or lymph nodes in your neck or underyour arms. This medicine may affect your concentration, function, or may hide signs that you are tired. You may get dizzy. Do not drive, use machinery, or do anything that needs mental alertness until you know how this drug affects you. Alcohol can make you more dizzy and may interfere with your response to this medicineor your alertness. Avoid alcoholic drinks. Birth control pills may not work properly while you are taking this medicine.Talk to your doctor about using an extra method of birth control. It is unknown if the effects of this medicine will be increased by the use of caffeine. Caffeine  is available in many foods, beverages, and medications. Ask your doctor if you should limit or change your intake of  caffeine-containingproducts while on this medicine. What side effects may I notice from receiving this medication? Side effects that you should report to your doctor or health care professionalas soon as possible: allergic reactions like skin rash, itching or hives, swelling of the face, lips, or tongue anxiety breathing problems chest pain fast, irregular heartbeat hallucinations increased blood pressure rash, fever, and swollen lymph nodes redness, blistering, peeling or loosening of the skin, including inside the mouth sore throat, fever, or chills suicidal thoughts or other mood changes tremors vomiting Side effects that usually do not require medical attention (report to yourdoctor or health care professional if they continue or are bothersome): headache nausea, diarrhea, or stomach upset nervousness trouble sleeping This list may not describe all possible side effects. Call your doctor for medical advice about side effects. You may report side effects to FDA at1-800-FDA-1088. Where should I keep my medication? Keep out of the reach of children. This medicine can be abused. Keep your medicine in a safe place to protect it from theft. Do not share this medicine with anyone. Selling or giving away this medicine is dangerous and against thelaw. This medicine may cause accidental overdose and death if taken by other adults, children, or pets. Mix any unused medicine with a substance like cat litter or coffee grounds. Then throw the medicine away in a sealed container like a sealed bag or a coffee can with a lid. Do not use the medicine after theexpiration date. Store at room temperature between 20 and 25 degrees C (68 and 77 degrees F). NOTE: This sheet is a summary. It may not cover all possible information. If you have questions about this medicine, talk to your doctor, pharmacist, orhealth care provider.  2022 Elsevier/Gold Standard (2018-09-04 10:08:08) Hypersomnia Hypersomnia is a  condition in which a person feels very tired during the day even though he or she gets plenty of sleep at night. A person with this condition may take naps during the day and may find it very difficult to wake up from sleep. Hypersomnia may affect a person's ability to think, concentrate,drive, or remember things. What are the causes? The cause of this condition may not be known. Possible causes include: Certain medicines. Sleep disorders, such as narcolepsy and sleep apnea. Injury to the head, brain, or spinal cord. Drug or alcohol use. Gastroesophageal reflux disease (GERD). Tumors. Certain medical conditions, such as depression, diabetes, or an underactive thyroid gland (hypothyroidism). What are the signs or symptoms? The main symptoms of hypersomnia include: Feeling very tired throughout the day, regardless of how much sleep you got the night before. Having trouble waking up. Others may find it difficult to wake you up when you are sleeping. Sleeping for longer and longer periods at a time. Taking naps throughout the day. Other symptoms may include: Feeling restless, anxious, or annoyed. Lacking energy. Having trouble with: Remembering. Speaking. Thinking. Loss of appetite. Seeing, hearing, tasting, smelling, or feeling things that are not real (hallucinations). How is this diagnosed? This condition may be diagnosed based on: Your symptoms and medical history. Your sleeping habits. Your health care provider may ask you to write down your sleeping habits in a daily sleep log, along with any symptoms you have. A series of tests that are done while you sleep (sleep study or polysomnogram). A test that measures how quickly you can fall asleep during the day (daytime  nap study or multiple sleep latency test). How is this treated? Treatment can help you manage your condition. Treatment may include: Following a regular sleep routine. Lifestyle changes, such as changing your eating  habits, getting regular exercise, and avoiding alcohol or caffeinated beverages. Taking medicines to make you more alert (stimulants) during the day. Treating any underlying medical causes of hypersomnia. Follow these instructions at home: Sleep routine  Schedule the same bedtime and wake-up time each day. Practice a relaxing bedtime routine. This may include reading, meditation, deep breathing, or taking a warm bath before going to sleep. Get regular exercise each day. Avoid strenuous exercise in the evening hours. Keep your sleep environment at a cooler temperature, darkened, and quiet. Sleep with pillows and a mattress that are comfortable and supportive. Schedule short 20-minute naps for when you feel sleepiest during the day. Talk with your employer or teachers about your hypersomnia. If possible, adjust your schedule so that: You have a regular daytime work schedule. You can take a scheduled nap during the day. You do not have to work or be active at night. Do not eat a heavy meal for a few hours before bedtime. Eat your meals at about the same times every day. Avoid drinking alcohol or caffeinated beverages.  Safety  Do not drive or use heavy machinery if you are sleepy. Ask your health care provider if it is safe for you to drive. Wear a life jacket when swimming or spending time near water.  General instructions Take supplements and over-the-counter and prescription medicines only as told by your health care provider. Keep a sleep log that will help your doctor manage your condition. This may include information about: What time you go to bed each night. How often you wake up at night. How many hours you sleep at night. How often and for how long you nap during the day. Any observations from others, such as leg movements during sleep, sleep walking, or snoring. Keep all follow-up visits as told by your health care provider. This is important. Contact a health care provider  if: You have new symptoms. Your symptoms get worse. Get help right away if: You have serious thoughts about hurting yourself or someone else. If you ever feel like you may hurt yourself or others, or have thoughts about taking your own life, get help right away. You can go to your nearest emergency department or call: Your local emergency services (911 in the [place] A suicide crisis helpline, such as the [facility] at (443)823-9193. This is open 24 hours a day. Summary Hypersomnia refers to a condition in which you feel very tired during the day even though you get plenty of sleep at night. A person with this condition may take naps during the day and may find it very difficult to wake up from sleep. Hypersomnia may affect a person's ability to think, concentrate, drive, or remember things. Treatment, such as following a regular sleep routine and making some lifestyle changes, can help you manage your condition. This information is not intended to replace advice given to you by your health care provider. Make sure you discuss any questions you have with your healthcare provider. Document Revised: 04/08/2020 Document Reviewed: 04/08/2020 Elsevier Patient Education  2022 [employer].

## 2021-02-08 ENCOUNTER — Telehealth: Payer: Self-pay

## 2021-02-08 NOTE — Telephone Encounter (Signed)
Approved.  This approval authorizes your coverage from 06/12/2020 - 06/11/2021

## 2021-02-08 NOTE — Telephone Encounter (Signed)
PA request for Modafinil '200mg'$  submitted on CMM, Key: [id number] - PA Case ID: [id number].  Awaiting determination from [place].

## 2021-03-09 ENCOUNTER — Telehealth: Payer: Self-pay | Admitting: Neurology

## 2021-03-09 NOTE — Telephone Encounter (Signed)
Called the patient back. She previously was getting supplies from [facility]. Advised the pt order will be sent to them today. Pt verbalized understanding. Pt had no questions at this time but was encouraged to call back if questions arise. She was appreciative for the call back.

## 2021-03-09 NOTE — Telephone Encounter (Signed)
Pt is asking that a prescription be sent to her previous company that she obtained her CPAP supplies thru, due to the new company not having in stock just what pt needs, please call.

## 2021-04-04 ENCOUNTER — Telehealth: Payer: Self-pay

## 2021-04-04 NOTE — Telephone Encounter (Signed)
Reviewed pt chart. Appears CB,RN sent order to [facility] 02/07/21.

## 2021-04-04 NOTE — Telephone Encounter (Signed)
Pt's insurance has denied sleep study request. [place] medicare has replied back stating that "repeat sleep testing is not supported once the source of your problem has been found(diagnosed)."  "There are no significant weight loss documentation, no desire to stop PAP treatment, no surgery scheduled, no dental device, and no intolerance issues"  I will be cancelling out the sleep study orders.

## 2021-08-10 ENCOUNTER — Other Ambulatory Visit: Payer: Self-pay | Admitting: Neurology

## 2021-08-10 NOTE — Telephone Encounter (Signed)
Received refill request for modafinil.  ?Last OV was on 02/07/21.  ?Next OV is scheduled for 02/07/22 .  ?Last RX was written on 05/16/21 for 90 tabs.  ? ?[place] [facility] has been reviewed.  ?

## 2021-11-10 ENCOUNTER — Other Ambulatory Visit: Payer: Self-pay | Admitting: Neurology

## 2021-11-14 NOTE — Telephone Encounter (Signed)
Last OV was on 02/07/21.  Next OV is scheduled for 02/07/22.  Last RX was written on 08/13/21 for 90 tabs.   [place] [facility] Database has been reviewed. Please e-scribe as work in [place]. Provider out.

## 2022-02-07 ENCOUNTER — Ambulatory Visit: Payer: Medicare HMO | Admitting: Neurology

## 2022-02-07 ENCOUNTER — Encounter: Payer: Self-pay | Admitting: Neurology

## 2022-02-07 VITALS — BP 139/84 | HR 65 | Ht 65.0 in | Wt 311.0 lb

## 2022-02-07 DIAGNOSIS — Z9189 Other specified personal risk factors, not elsewhere classified: Secondary | ICD-10-CM | POA: Insufficient documentation

## 2022-02-07 DIAGNOSIS — G4719 Other hypersomnia: Secondary | ICD-10-CM | POA: Diagnosis not present

## 2022-02-07 DIAGNOSIS — Z9989 Dependence on other enabling machines and devices: Secondary | ICD-10-CM

## 2022-02-07 DIAGNOSIS — G894 Chronic pain syndrome: Secondary | ICD-10-CM

## 2022-02-07 DIAGNOSIS — G4733 Obstructive sleep apnea (adult) (pediatric): Secondary | ICD-10-CM | POA: Diagnosis not present

## 2022-02-07 DIAGNOSIS — F1190 Opioid use, unspecified, uncomplicated: Secondary | ICD-10-CM | POA: Insufficient documentation

## 2022-02-07 MED ORDER — MODAFINIL 200 MG PO TABS: 100.0000 mg | ORAL_TABLET | Freq: Two times a day (BID) | ORAL | 3 refills | Status: DC

## 2022-02-07 NOTE — Progress Notes (Signed)
[facility]  Provider:  Dr Brett Fairy Referring Provider: [name], [place] Primary Care Physician:  [name], MD  Chief Complaint  Patient presents with   Follow-up    Pt alone, rm 11. Overall stable with CPAP and tolerating well. Current machine set up 11/05/2015. Eligible for a new machine. Current DME [facility].    Other    Last year both SS were denied by insurance: You stated in your notes that patient is compliant on CPAP.  You didn't document any significant weight loss, nor did pt want to discontinue use of PAP, no surgery to be done or dental device titration needed. Insurance says that a diagnosis has already been determined by previous study and a repeat study is not necessary for continued treatment.     02-07-2022: RV for Valerie. Valerie [name], 61 year-old patient . This patient's primary neurologist was Dr. Floyde Parkins, who is now retired. This patient was in chronic pain care and took opiates, was considered at high risk for central and obstructive apneas. I had requested a repeat sleep study for baseline evaluation- she has LVH and morbid obesity, too.   Her BMI is still high, but she was able to wean off the narcotics.  I congratulated Mr. Schoeppner and her ability to wean off narcotic pain medication.  She has remained a compliant user of CPAP is 97% compliance by days 90% compliance by hours this year.  They have been a water damage to her apartment and she had to move into a recliner for couple of days,  however her average user time is still 7 hours 10 minutes and that fulfills insurance criteria.   She is using an AutoSet from 2017 -between 6 and 17 cm of water pressure was 2 cm EPR and achieved a residual AHI or apnea hypopnea index, of 1.9/h.  Obstructive apneas of the majority of the residual.   The residual however is low.  Air leaks are moderate and the 95th percentile pressure needed to control her apnea is at 12.4 cm water well within her current  settings.    The patient endorsed the Epworth sleepiness scale at 7 points on modafinil and fatigue is 30/ 63 points.  Her Fatigue improved after opiates were weaned off.     02-07-2021: RV Interval history - the patient was diagnosed in 2021 with DM, she uses Insulin. She has had gastroparesis, had pancreatic surgery. Autoimmune diseases, p-ANCA, with numerous joint 'freezes'.  MTX was discontinued after one year, sees rheumatologist at [facility].  HLA B 27 positive . Broke a leg May 31 st 2022, she  had a "controlled fall" at the pool.  She continuous to gain weight, not loosing it.  Had retinal tears and recently had cataract surgery. She reports having excessive daytime sleepiness, some days , but then severe. Provigil prn.  Her original sleep study was in [place], [place]- now here followed since 2014.  She had a new machine in 7- 2017, while having acute renal failure. That was the time of her last CPAP - replacement. I ordered this new machine as an auto-titration device, but she was in emergency need when it broke down- was [employer]  She uses it 100% compliantly.       Interval history from 10/11/2016. 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 She had just been discharged after a prolonged hospitalization  On April 14  th - 21 2018, for bleeding. and in the meantime has actually learned her diagnosis of microscopic polyangiitis. This explains her renal disorder, fatigue, GI bleeds and overall pain. Her rheumatologist just started her on methotrexate. She continues now to use CPAP endorsed the Epworth sleepiness score at 11 points fatigue severity at 48 points, she is highly compliant patient uses the machine 93% of the last 30 days with an average of 8 hours and 1 minute, the machine is an AutoSet between 5 and 15 cm water pressure with 2 cm expiratory pressure relief. Residual AHI is 2.0 pressure at  the 91st percentile is 10 cm water she does have mild to moderate air leaks.      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 Joseph Art for  OSA , diagnosed after a  sleep study on  12-22-2008 at [facility]. She underwent a SPLIT study 02-18-13  here at [facility] . The patient underwent a retitration here at [facility] lab  on 9-9 2014,  with Dr. Joseph Art listed as the referring physician.  She could not tolerate the CPAP settings that she had moved here with from [place] in early 2010.  At the time at [facility] she was titrated to 9 cm water- now for night use later re\re titration resulted in a pressure of 20 and an AHI of 20.6 RDI of 33.8 REM AHI of 21.2. There was no tachybradycardia arrhythmia and altered no prolonged desaturation altered, and no periodic limb movements seen. The patient was now actually titrated to 12 cm water she is now using a ResMed Mirage mask in a small size and uses heated humidity. The machine is the same she had 2010 .  Valerie. Metzer will need a prescription for the full facemask that she has used before and that she prefers using in times of nasal allergic rhinitis. She also will have cushions refilled for her nasal pillow mask. She reports that over the last week for 2 weeks she has become excessively daytime sleepy again currently she endorsed the Epworth sleepiness score on at 13 points and the fatigue severity score at 53-54 points. This is dated 08-19-14     Her sleep habits and social habits have not changed in any way to explain the increase in daytime sleepiness and fatigue. She takes Zyrtec in the morning during her allergy season which should not give her this degree of sleepiness. She had no infection or inflammatory disorders that she is aware of. He is easier short of breath and she does have some underlying wheezing  which have been mentioned by Dr. Verline Lema. She never had wheezing before 2015 there of. This may be an asthmatic component and she has Proventil as an inhaler available .her voice in the choir is affected. Her endurance span is shorter.  Interval history from 12/30/2015. Valerie [name] was just released yesterday from a hospital stay necessitated by repeated loss of consciousness or loss of awareness, interpreted as sleep attacks!!!. She was admitted with acute renal failure and was confused. She states she spent the evening at a friend's house where she seemed to lose awareness of her surroundings, she reports felt falling asleep in the dry for. And neighbor knocked on the window of her car and woke her up. There have been several of these spells on Saturday last.  By Monday she was admitted to hospital. This is not related to a sleep disorder with a metabolic problem with acute renal failure. 1. Confusion 2. Drowsiness 3. Dehydration 4. Abnormal urinalysis - Will cover patient for possible pyelonephritis with IM ceftriaxone given UA. We will also obtain head CT to r/o intracranial process. Consider using ciprofloxacin to supplement ceftriaxone. Urine culture pending. Differential includes meningitis, adverse effects from multiple strong pain medications/CS. 5. Chronic pain syndrome - Counseled on use of controlled substances, patient insists that this is not a contributing factor. P ANCA  Positive.    Interval history from 11 Oct 2017:  I had the pleasure of meeting with Valerie [name] today who has been a long-standing CPAP user.  Valerie [name] is a patient of Dr. Jannifer Franklin, MD,  Valerie [name] has been compliant her download confirms 97% compliance with an average usage time of 8 hours and 10 minutes, she is using an AutoSet CPAP between 5 and 15 cmH2O is 2 cm expiratory pressure relief.  Her residual AHI is only 2.5 there are no significant central apneas emergent, 95th percentile pressure is 11.3 cmH2O, well  was in the pressure window was given.  She does have some minor air leaks.  She continues to be excessively daytime sleepy her Epworth score is between 10 and 12, she also struggles with fatigue and endorses fatigue severity score of 51 points.  Last year she had tested positive for a P ANCA, a repeat test however was negative she was referred to [facility] rheumatology and was diagnosed with an HLA B 27, likely reflecting ankylosing spondylitis.  She underwent ECHO cardiography, LV hypertrophy, normal EF.   This may explain a lot of her joint pain and some of the residual fatigue as well.  Autoimmune diseases all contribute to fatigue syndromes.  Her persistent hypersomnia and fatigue do not bear the hallmarks of narcolepsy also she does struggle to stay awake and physically not active and mentally not stimulated. She does not endorse vivid dreams and rare, isolated  sleep paralysis. She feels sometimes frozen in her joints, not a muscle weakness.    ROS , her Epworth score and her fatigue severity have not changed over the last 18 months. Patients with renal failure are still allowed to take modafinil as it is a hepatic metabolized medication. The same is true for her sleep and pain medication. Epworth 11,  FSS 48 ,  she is using Provigil with some success .  She is taking morphine 60 mg 3 times a day but she takes it only once a day and has been monitored by Dr. Verline Lema. Baclofen does help very little, makes her more sleepy. She is actively followed by pain clinic.Intercostal pain, spasms , joint pain.   How likely are you to doze in the following situations: 0 = not likely, 1 = slight chance, 2 = moderate chance, 3 = high chance  Sitting and Reading? Watching Television? Sitting inactive in a public place (theater or meeting)? Lying down in the afternoon when circumstances permit? Sitting and talking to someone? Sitting quietly after lunch without alcohol? In a car, while stopped for a few minutes  in traffic? As a passenger in a car for an hour without a break?  Total = 7 on modafinil , down from 14 points in 2022, FSS  30 down from 45 points.  Is I the weaning off opiates? Modafinil.   Current Outpatient Medications  Medication Sig Dispense Refill   albuterol (PROAIR HFA) 108 (  90 Base) MCG/ACT inhaler Inhale 2 puffs into the lungs every 4 (four) hours as needed for wheezing or shortness of breath. 8.5 each 11   allopurinol (ZYLOPRIM) 300 MG tablet TAKE 1/2 TABLET BY MOUTH ONCE A DAY (Patient taking differently: Take 150 mg by mouth daily.) 45 tablet 0   ALPRAZolam (XANAX) 1 MG tablet Take 1 tablet (1 mg total) by mouth 3 (three) times daily as needed. for anxiety 90 tablet 1   baclofen (LIORESAL) 10 MG tablet Take 10 mg by mouth 3 (three) times daily as needed for muscle spasms.      benazepril (LOTENSIN) 40 MG tablet Take 1 tablet (40 mg total) by mouth daily. 90 tablet 1   CARAFATE 1 GM/10ML suspension TAKE 10 ML'S BY MOUTH FOUR TIMES A DAY AS NEEDED FOR STOMACH PAIN (Patient taking differently: Take 1 g by mouth 3 (three) times daily as needed (burning in esophagus).) 420 mL 1   cetirizine (ZYRTEC) 10 MG tablet Take 10 mg by mouth daily.      Cholecalciferol (VITAMIN D3) 5000 units CAPS Take 5,000 Units by mouth daily.      dexlansoprazole (DEXILANT) 60 MG capsule Take 60 mg by mouth daily.      fexofenadine (ALLEGRA) 180 MG tablet Take by mouth.     fluticasone (FLONASE) 50 MCG/ACT nasal spray PLACE 2 SPRAYS INTO BOTH NOSTRILS DAILY 48 g 1   furosemide (LASIX) 20 MG tablet Take 20 mg by mouth every other day as needed (fluid retention.).     HYDROcodone-acetaminophen (NORCO) 10-325 MG per tablet Take 1-2 tablets by mouth every 6 (six) hours as needed for moderate pain (pain.).      hydrOXYzine (ATARAX/VISTARIL) 10 MG tablet Take 1-3 tablets (10-30 mg total) by mouth 3 (three) times daily as needed for anxiety. 180 tablet 1   ibuprofen (ADVIL,MOTRIN) 600 MG tablet Take 600 mg by  mouth daily as needed (migraines).     Insulin Glargine (BASAGLAR KWIKPEN) 100 UNIT/ML at bedtime as needed.     insulin regular (NOVOLIN R) 100 units/mL injection as directed. CHECK GLUCOSE EVERY 4 HOURS. IF GLUCOSE  <150 GIVE NO INSULIN; IF 150-199 GIVE 2 UNITS [place]; IF 200-249 GIVE 4 UNITS; IF 250-299 GIVE 6 UNITS; IF 300-349 GIVE 8 UNITS; IF 350-399 GIVE 10 UNITS; IF 400 AND ABOVE GIVE 12 UNITS     ipratropium (ATROVENT) 0.03 % nasal spray PLACE 2 SPRAYS INTO THE AFFECTED NOSTRIL(S) 4 TIMES A DAY (Patient taking differently: Place 2 sprays into both nostrils 4 (four) times daily as needed (congestion.).) 30 mL 0   Melatonin 5 MG TABS Take 5-10 mg by mouth at bedtime as needed (sleep).     metoprolol succinate (TOPROL-XL) 100 MG 24 hr tablet Take 1 tablet (100 mg total) by mouth daily. Take with or immediately following a meal. 90 tablet 3   modafinil (PROVIGIL) 200 MG tablet TAKE 1/2 TABLET BY MOUTH TWICE A DAY 90 tablet 0   montelukast (SINGULAIR) 10 MG tablet TAKE ONE TABLET BY MOUTH EVERY NIGHT AT BEDTIME (Patient taking differently: Take 10 mg by mouth at bedtime.) 90 tablet 1   Multiple Vitamins-Minerals (MULTIVITAMIN PO) Take 1 tablet by mouth daily.     Olopatadine HCl (PAZEO) 0.7 % SOLN Apply 1 drop to eye daily as needed (allergies). 1 Bottle 5   ondansetron (ZOFRAN) 4 MG tablet Take 1 tablet (4 mg total) by mouth every 8 (eight) hours as needed for nausea or vomiting. 20 tablet 0  Pancrelipase, Lip-Prot-Amyl, 24000-76000 units CPEP Take 24,000-72,000 Units by mouth 3 (three) times daily as needed (for pancreatitis).      promethazine (PHENERGAN) 25 MG tablet Take 1 tablet (25 mg total) by mouth every 6 (six) hours as needed for nausea or vomiting. 30 tablet 1   torsemide (DEMADEX) 10 MG tablet Take 1 tablet by mouth daily as needed.     No current facility-administered medications for this visit.    Allergies as of 02/07/2022 - Review Complete 02/07/2022  Allergen Reaction Noted    Dilaudid [hydromorphone hcl] Other (See Comments) 08/01/2011   Effexor [venlafaxine]  03/09/2017   Pregabalin Other (See Comments) 08/01/2011   Empagliflozin Other (See Comments) 12/29/2019   Adhesive [tape]  04/04/2018   Metoclopramide hcl Other (See Comments) 08/01/2011   Neurontin [gabapentin] Other (See Comments) 08/01/2011   Serotonin reuptake inhibitors (ssris) Other (See Comments) 11/18/2016   Metformin and related Diarrhea 11/05/2019      Vitals: BP 139/84   Pulse 65   Ht '5\' 5"'  (1.651 m)   Wt (!) 311 lb (141.1 kg)   BMI 51.75 kg/m  Last Weight:  Wt Readings from Last 1 Encounters:  02/07/22 (!) 311 lb (141.1 kg)   Last Height:   Ht Readings from Last 1 Encounters:  02/07/22 '5\' 5"'  (1.651 m)   Vision Screening:  Vitals   Physical exam:    General: The patient is awake, alert and appears not in acute distress. The patient is well groomed. She is fatigued, but cooperative.  Head: Normocephalic, atraumatic.  Neck is supple. Large neck but low gradeupper airway restriction. Mallampati 2, narrow laterally , but high , peaked palate - short airway , reddened mucosa.   neck circumference:18. 5 inches! . Cardiovascular:  Regular rate and rhythm, and without distended neck veins. Respiratory: Lungs are clear to auscultation. Skin:  Without evidence of edema, or rash Trunk: BMI is severely elevated ,- slightly  Stooped  posture. morbidly obese.   Neurologic exam : The patient is awake and alert, oriented to place and time. Memory subjective  described as intact. There is a normal attention span & concentration ability.  Speech is fluent without dysarthria, dysphonia or aphasia.  Mood and affect are appropriate.  Cranial nerves: Sense of smell is impaired , and she reported last year a metallic taste in her mouth.  Tongue no longer " metallic "- recovered with the recovery from acute renal failure. . Pupils are equal and briskly reactive to light. Extraocular movements   in vertical and horizontal planes intact and without nystagmus. Visual fields by finger perimetry are intact.Hearing to finger rub intact. Facial sensation intact to fine touch. Facial motor strength is symmetric and tongue and uvula move midline.  Motor exam:   She reports pain in both legs with Valsalva!  Normal tone and normal muscle bulk and symmetric normal strength in all extremities. ROM limitation in  Right hip,  Left and right shoulder RO limited.  Sensory:  Fine touch and vibration were normal. Coordination: Rapid alternating movements in the fingers/hands is tested and normal.  Gait and station: Patient walks without assistive device and is able unassisted to climb up to the exam table. Deep tendon reflexes: in the  upper and lower extremities are symmetric and intact. Babinski maneuver response is downgoing.   Assessment:   After physical and neurologic examination, review of laboratory studies, imaging, neurophysiology testing and pre-existing records, assessment is that :   1) OSA- highly compliant with CPAP, set  up in 2017 , should be due for new machine- residual AHI is low-and much less fatigued and sleepy than last year. She weaned off narcotic pain medication, she gained some additional weight, BMI 52/ kg m2.   Fatigue was not related to OSA.   2) HLA B 27 positive, suspected to have ankylosing spondylitis. Causing  inflammation, autoimmune reaction, joint pain.   Question : peripheral neuropathy, brachial plexus injury - had been seen by Dr. Jannifer Franklin. EMG / NCS -Not improved .  3) chronic pain is still present , in pain therapy - no longer on narcotics / pain was deemed related to autoimmune disorder.    I would love to obtain a new baseline by home sleep test or in lab in the coming months she is doing for her new machine as hers now is 61 years old but I would like to establish a new baseline first. BMI j has increased since last study and opiates have been weaned off(  morphine !) she still uses hydrocodone a couple of times a week, usually in AM and not before sleep time.   Modafinil - prn refilled.   Visit 35 minutes with obtaining ED notes and [facility] progress notes, nephrology notes, discussion of newly diagnosed autoimmune disorder and rheumatology. Pain, arthralgia and autoimmune related colitis / renal disease,  I follow for OSA only,   RV in 12 months if HST is denied, if HST is allowed, please schedule for visit with new CPAP>    Larey Seat, MD   Cc Valerie Presto, NP,  Dr Joseph Art

## 2022-02-07 NOTE — Patient Instructions (Signed)
Obesity-Hypoventilation Syndrome Obesity-hypoventilation syndrome (OHS) is a condition in which a person cannot efficiently move air in and out of the lungs (ventilate). This condition causes a buildup of carbon dioxidelevels in the blood and a drop in oxygen levels. OHS can increase the risk for: Cor pulmonale, or right-sided heart failure. Left-sided heart failure. Pulmonary hypertension, or high blood pressure in the arteries in the lungs. Too many red blood cells in the body. Disability or death. What are the causes? This condition may be caused by: Being obese with a BMI (body mass index) greater than or equal to 30 kg/m2. Having too much fat around the abdomen, chest, and neck. The brain being unable to properly manage the high carbon dioxide and low oxygen levels. Hormones made by fat cells. These hormones may interfere with breathing. Sleep apnea. This is when breathing stops, pauses, or is shallow during sleep. What are the signs or symptoms? Symptoms of this condition include: Feeling sleepy during the day. Headaches. These may be worse in the morning. Shortness of breath. Snoring, choking, gasping, or trouble breathing during sleep. Poor concentration or poor memory. Mood changes or feeling irritable. Depression. How is this diagnosed? This condition may be diagnosed by: BMI measurement. Blood tests to measure blood levels of serum bicarbonate, carbon dioxide, and oxygen. Pulse oximetry to measure the amount of oxygen in your blood. This uses a small device that is placed on your finger, earlobe, or toe. Polysomnogram, or sleep study, to check your breathing patterns and levels of oxygen and carbon dioxide while you sleep. You may also have other tests, including: A chest X-ray to rule out other breathing problems. Lung tests, or pulmonary function tests, to rule out other breathing problems. Electrocardiogram (ECG) or echocardiogram to check for signs of heart  failure. How is this treated? This condition may be treated with: A device such as a continuous positive airway pressure (CPAP) machine or a bi-level positive airway pressure (BIPAP) machine. These devices deliver pressure and sometimes oxygen to make breathing easier. A mask may be placed over your nose or mouth. Oxygen if your blood oxygen levels are low. A weight-loss program. Bariatric, or weight-loss, surgery. Tracheostomy. A tube is placed in the windpipe through the neck to help with breathing. Follow these instructions at home:  Medicines Take over-the-counter and prescription medicines only as told by your health care provider. Ask your health care provider what medicines are safe for you. You may be told to avoid medicines such as sedatives and narcotics. These can affect breathing and make OHS worse. Sleeping habits If you are prescribed a CPAP or a BIPAP machine, make sure you understand how to use it. Use your CPAP or BIPAP machine only as told by your health care provider. Try to get at least 8 hours of sleep every night. Eating and drinking  Eat foods that are high in fiber, such as beans, whole grains, and fresh fruits and vegetables. Limit foods that are high in fat and processed sugars, such as fried or sweet foods. Drink enough fluid to keep your urine pale yellow. Do not drink alcohol if: Your health care provider tells you not to drink. You are pregnant, may be pregnant, or are planning to become pregnant. General instructions Follow a diet and exercise plan that helps you reach and keep a healthy weight as told by your health care provider. Exercise regularly as told by your health care provider. Do not use any products that contain nicotine or tobacco. These products   include cigarettes, chewing tobacco, and vaping devices, such as e-cigarettes. If you need help quitting, ask your health care provider. Keep all follow-up visits. This is important. Contact a health  care provider if: You develop new or worsening shortness of breath. You are having trouble waking up or staying awake. You are confused. You develop a cough. You have a fever. Get help right away if: You have chest pain. You have fast or irregular heartbeats. You are dizzy or you faint. You have any symptoms of a stroke. "BE FAST" is an easy way to remember the main warning signs of a stroke: B - Balance. Signs are dizziness, sudden trouble walking, or loss of balance. E - Eyes. Signs are trouble seeing or a sudden change in vision. F - Face. Signs are sudden weakness or numbness of the face, or the face or eyelid drooping on one side. A - Arms. Signs are weakness or numbness in an arm. This happens suddenly and usually on one side of the body. S - Speech. Signs are sudden trouble speaking, slurred speech, or trouble understanding what people say. T - Time. Time to call emergency services. Write down what time symptoms started. You have other signs of a stroke, such as: A sudden, severe headache with no known cause. Nausea or vomiting. Seizure. These symptoms may be an emergency. Get help right away. Call 911. Do not wait to see if the symptoms will go away. Do not drive yourself to the hospital. Summary Obesity-hypoventilation syndrome (OHS) causes a buildup of carbon dioxidelevels in the blood and a drop in oxygen levels. OHS can increase the risk for heart failure, pulmonary hypertension, disability, and death. Follow your diet and exercise plan as told by your health care provider. Get help right away if you have any symptoms of a stroke. This information is not intended to replace advice given to you by your health care provider. Make sure you discuss any questions you have with your health care provider. Document Revised: 01/04/2021 Document Reviewed: 01/04/2021 Elsevier Patient Education  2023 [employer]  

## 2022-02-22 ENCOUNTER — Other Ambulatory Visit: Payer: Self-pay | Admitting: Gastroenterology

## 2022-02-24 ENCOUNTER — Encounter (HOSPITAL_COMMUNITY): Payer: Self-pay | Admitting: Gastroenterology

## 2022-02-24 ENCOUNTER — Other Ambulatory Visit: Payer: Self-pay

## 2022-02-28 ENCOUNTER — Ambulatory Visit (HOSPITAL_COMMUNITY)
Admission: RE | Admit: 2022-02-28 | Discharge: 2022-02-28 | Disposition: A | Discharge status: Home / Self Care | Payer: Medicare HMO | Attending: Gastroenterology | Admitting: Gastroenterology

## 2022-02-28 ENCOUNTER — Encounter (HOSPITAL_COMMUNITY): Payer: Self-pay | Admitting: Gastroenterology

## 2022-02-28 ENCOUNTER — Other Ambulatory Visit: Payer: Self-pay

## 2022-02-28 ENCOUNTER — Encounter (HOSPITAL_COMMUNITY)
Admission: RE | Disposition: A | Discharge status: Home / Self Care | Payer: Self-pay | Source: Home / Self Care | Attending: Gastroenterology

## 2022-02-28 ENCOUNTER — Ambulatory Visit (HOSPITAL_BASED_OUTPATIENT_CLINIC_OR_DEPARTMENT_OTHER): Payer: Medicare HMO | Admitting: Anesthesiology

## 2022-02-28 ENCOUNTER — Ambulatory Visit (HOSPITAL_COMMUNITY): Payer: Medicare HMO | Admitting: Anesthesiology

## 2022-02-28 DIAGNOSIS — G4730 Sleep apnea, unspecified: Secondary | ICD-10-CM | POA: Diagnosis not present

## 2022-02-28 DIAGNOSIS — I509 Heart failure, unspecified: Secondary | ICD-10-CM

## 2022-02-28 DIAGNOSIS — N189 Chronic kidney disease, unspecified: Secondary | ICD-10-CM

## 2022-02-28 DIAGNOSIS — M797 Fibromyalgia: Secondary | ICD-10-CM | POA: Diagnosis not present

## 2022-02-28 DIAGNOSIS — K3184 Gastroparesis: Secondary | ICD-10-CM

## 2022-02-28 DIAGNOSIS — D649 Anemia, unspecified: Secondary | ICD-10-CM | POA: Insufficient documentation

## 2022-02-28 DIAGNOSIS — I130 Hypertensive heart and chronic kidney disease with heart failure and stage 1 through stage 4 chronic kidney disease, or unspecified chronic kidney disease: Secondary | ICD-10-CM

## 2022-02-28 DIAGNOSIS — M1990 Unspecified osteoarthritis, unspecified site: Secondary | ICD-10-CM | POA: Diagnosis not present

## 2022-02-28 DIAGNOSIS — K449 Diaphragmatic hernia without obstruction or gangrene: Secondary | ICD-10-CM | POA: Insufficient documentation

## 2022-02-28 DIAGNOSIS — R197 Diarrhea, unspecified: Secondary | ICD-10-CM | POA: Insufficient documentation

## 2022-02-28 DIAGNOSIS — E1122 Type 2 diabetes mellitus with diabetic chronic kidney disease: Secondary | ICD-10-CM | POA: Insufficient documentation

## 2022-02-28 DIAGNOSIS — D759 Disease of blood and blood-forming organs, unspecified: Secondary | ICD-10-CM | POA: Diagnosis not present

## 2022-02-28 DIAGNOSIS — J45909 Unspecified asthma, uncomplicated: Secondary | ICD-10-CM | POA: Insufficient documentation

## 2022-02-28 DIAGNOSIS — R1011 Right upper quadrant pain: Secondary | ICD-10-CM | POA: Insufficient documentation

## 2022-02-28 DIAGNOSIS — D631 Anemia in chronic kidney disease: Secondary | ICD-10-CM

## 2022-02-28 DIAGNOSIS — F419 Anxiety disorder, unspecified: Secondary | ICD-10-CM | POA: Diagnosis not present

## 2022-02-28 DIAGNOSIS — E1143 Type 2 diabetes mellitus with diabetic autonomic (poly)neuropathy: Secondary | ICD-10-CM | POA: Diagnosis not present

## 2022-02-28 DIAGNOSIS — Z794 Long term (current) use of insulin: Secondary | ICD-10-CM

## 2022-02-28 HISTORY — PX: ESOPHAGOGASTRODUODENOSCOPY (EGD) WITH PROPOFOL: SHX5813

## 2022-02-28 HISTORY — DX: Personal history of other diseases of the digestive system: Z87.19

## 2022-02-28 HISTORY — DX: Type 2 diabetes mellitus without complications: E11.9

## 2022-02-28 HISTORY — PX: BOTOX INJECTION: SHX5754

## 2022-02-28 LAB — GLUCOSE, CAPILLARY
Glucose-Capillary: 96 mg/dL (ref 70–99)
Glucose-Capillary: 113 mg/dL — ABNORMAL HIGH (ref 70–99)

## 2022-02-28 SURGERY — ESOPHAGOGASTRODUODENOSCOPY (EGD) WITH PROPOFOL
Anesthesia: Monitor Anesthesia Care

## 2022-02-28 MED ORDER — SODIUM CHLORIDE (PF) 0.9 % IJ SOLN
INTRAMUSCULAR | Status: DC | PRN
  Administered 2022-02-28: 4 mL via SUBMUCOSAL

## 2022-02-28 MED ORDER — LIDOCAINE HCL (CARDIAC) PF 100 MG/5ML IV SOSY
PREFILLED_SYRINGE | INTRAVENOUS | Status: DC | PRN
  Administered 2022-02-28: 100 mg via INTRAVENOUS

## 2022-02-28 MED ORDER — ONABOTULINUMTOXINA 100 UNITS IJ SOLR
INTRAMUSCULAR | Status: AC
  Filled 2022-02-28: qty 100

## 2022-02-28 MED ORDER — PROPOFOL 10 MG/ML IV BOLUS
INTRAVENOUS | Status: DC | PRN
  Administered 2022-02-28: 20 mg via INTRAVENOUS
  Administered 2022-02-28: 80 mg via INTRAVENOUS
  Administered 2022-02-28: 40 mg via INTRAVENOUS
  Administered 2022-02-28: 80 mg via INTRAVENOUS
  Administered 2022-02-28: 40 mg via INTRAVENOUS

## 2022-02-28 SURGICAL SUPPLY — 15 items

## 2022-02-28 NOTE — Discharge Instructions (Addendum)
Call if question or problem and follow-up as needed or in 6 to 12 months and do not drive today and first meal soft solids and slowly advance dietYOU HAD AN ENDOSCOPIC PROCEDURE TODAY: Refer to the procedure report and other information in the discharge instructions given to you for any specific questions about what was found during the examination. If this information does not answer your questions, please call [facility] office at [phone] to clarify.   YOU SHOULD EXPECT: Some feelings of bloating in the abdomen. Passage of more gas than usual. Walking can help get rid of the air that was put into your GI tract during the procedure and reduce the bloating. If you had a lower endoscopy (such as a colonoscopy or flexible sigmoidoscopy) you may notice spotting of blood in your stool or on the toilet paper. Some abdominal soreness may be present for a day or two, also.  DIET: Your first meal following the procedure should be a light meal and then it is ok to progress to your normal diet. A half-sandwich or bowl of soup is an example of a good first meal. Heavy or fried foods are harder to digest and may make you feel nauseous or bloated. Drink plenty of fluids but you should avoid alcoholic beverages for 24 hours. If you had a esophageal dilation, please see attached instructions for diet.    ACTIVITY: Your care partner should take you home directly after the procedure. You should plan to take it easy, moving slowly for the rest of the day. You can resume normal activity the day after the procedure however YOU SHOULD NOT DRIVE, use power tools, machinery or perform tasks that involve climbing or major physical exertion for 24 hours (because of the sedation medicines used during the test).   SYMPTOMS TO REPORT IMMEDIATELY: A gastroenterologist can be reached at any hour. Please call [phone]  for any of the following symptoms:  Following lower endoscopy (colonoscopy, flexible sigmoidoscopy) Excessive  amounts of blood in the stool  Significant tenderness, worsening of abdominal pains  Swelling of the abdomen that is new, acute  Fever of 100 or higher  Following upper endoscopy (EGD, EUS, ERCP, esophageal dilation) Vomiting of blood or coffee ground material  New, significant abdominal pain  New, significant chest pain or pain under the shoulder blades  Painful or persistently difficult swallowing  New shortness of breath  Black, tarry-looking or red, bloody stools  FOLLOW UP:  If any biopsies were taken you will be contacted by phone or by letter within the next 1-3 weeks. Call [phone]  if you have not heard about the biopsies in 3 weeks.  Please also call with any specific questions about appointments or follow up tests. YOU HAD AN ENDOSCOPIC PROCEDURE TODAY: Refer to the procedure report and other information in the discharge instructions given to you for any specific questions about what was found during the examination. If this information does not answer your questions, please call [facility] office at [phone] to clarify.   YOU SHOULD EXPECT: Some feelings of bloating in the abdomen. Passage of more gas than usual. Walking can help get rid of the air that was put into your GI tract during the procedure and reduce the bloating. If you had a lower endoscopy (such as a colonoscopy or flexible sigmoidoscopy) you may notice spotting of blood in your stool or on the toilet paper. Some abdominal soreness may be present for a day or two, also.  DIET: Your first  meal following the procedure should be a light meal and then it is ok to progress to your normal diet. A half-sandwich or bowl of soup is an example of a good first meal. Heavy or fried foods are harder to digest and may make you feel nauseous or bloated. Drink plenty of fluids but you should avoid alcoholic beverages for 24 hours. If you had a esophageal dilation, please see attached instructions for diet.    ACTIVITY: Your care  partner should take you home directly after the procedure. You should plan to take it easy, moving slowly for the rest of the day. You can resume normal activity the day after the procedure however YOU SHOULD NOT DRIVE, use power tools, machinery or perform tasks that involve climbing or major physical exertion for 24 hours (because of the sedation medicines used during the test).   SYMPTOMS TO REPORT IMMEDIATELY: A gastroenterologist can be reached at any hour. Please call [phone]  for any of the following symptoms:  Following lower endoscopy (colonoscopy, flexible sigmoidoscopy) Excessive amounts of blood in the stool  Significant tenderness, worsening of abdominal pains  Swelling of the abdomen that is new, acute  Fever of 100 or higher  Following upper endoscopy (EGD, EUS, ERCP, esophageal dilation) Vomiting of blood or coffee ground material  New, significant abdominal pain  New, significant chest pain or pain under the shoulder blades  Painful or persistently difficult swallowing  New shortness of breath  Black, tarry-looking or red, bloody stools  FOLLOW UP:  If any biopsies were taken you will be contacted by phone or by letter within the next 1-3 weeks. Call [phone]  if you have not heard about the biopsies in 3 weeks.  Please also call with any specific questions about appointments or follow up tests.

## 2022-02-28 NOTE — Anesthesia Preprocedure Evaluation (Addendum)
Anesthesia Evaluation  Patient identified by MRN, date of birth, ID band Patient awake    Reviewed: Allergy & Precautions, NPO status , Patient's Chart, lab work & pertinent test results  Airway Mallampati: II  TM Distance: >3 FB Neck ROM: Full    Dental no notable dental hx.    Pulmonary asthma , sleep apnea and Continuous Positive Airway Pressure Ventilation ,    Pulmonary exam normal        Cardiovascular hypertension, Pt. on medications and Pt. on home beta blockers +CHF  + dysrhythmias  Rhythm:Regular Rate:Normal     Neuro/Psych  Headaches, Anxiety Depression    GI/Hepatic Neg liver ROS, hiatal hernia, GERD  ,  Endo/Other  diabetes  Renal/GU CRF  negative genitourinary   Musculoskeletal  (+) Arthritis , Fibromyalgia -  Abdominal Normal abdominal exam  (+)   Peds  Hematology  (+) Blood dyscrasia, anemia ,   Anesthesia Other Findings   Reproductive/Obstetrics                             Anesthesia Physical Anesthesia Plan  ASA: 3  Anesthesia Plan: MAC   Post-op Pain Management:    Induction: Intravenous  PONV Risk Score and Plan: 2 and Propofol infusion and Treatment may vary due to age or medical condition  Airway Management Planned: Simple Face Mask, Natural Airway and Nasal Cannula  Additional Equipment: None  Intra-op Plan:   Post-operative Plan:   Informed Consent: I have reviewed the patients History and Physical, chart, labs and discussed the procedure including the risks, benefits and alternatives for the proposed anesthesia with the patient or authorized representative who has indicated his/her understanding and acceptance.     Dental advisory given  Plan Discussed with: CRNA  Anesthesia Plan Comments:         Anesthesia Quick Evaluation

## 2022-02-28 NOTE — Transfer of Care (Signed)
Immediate Anesthesia Transfer of Care Note  Patient: Valerie [name]  Procedure(s) Performed: ESOPHAGOGASTRODUODENOSCOPY (EGD) WITH PROPOFOL BOTOX INJECTION  Patient Location: PACU and Endoscopy Unit  Anesthesia Type:MAC  Level of Consciousness: awake, alert  and oriented  Airway & Oxygen Therapy: Patient Spontanous Breathing  Post-op Assessment: Report given to RN and Post -op Vital signs reviewed and stable  Post vital signs: Reviewed and stable  Last Vitals:  Vitals Value Taken Time  BP    Temp    Pulse 76 02/28/22 1347  Resp 16 02/28/22 1347  SpO2 98 % 02/28/22 1347  Vitals shown include unvalidated device data.  Last Pain:  Vitals:   02/28/22 1229  TempSrc: Temporal  PainSc: 0-No pain         Complications: No notable events documented.

## 2022-02-28 NOTE — Progress Notes (Signed)
Valerie [name] 1:01 PM  Subjective: Patient had been doing well and unfortunately her gastroparesis has worsened she has had more upper tract symptoms and she has always been helped by Botox for long-term control her lower bowels have been okay and she has no new complaints  Objective: Vital signs stable afebrile acute distress exam please see preassessment evaluation previous work-up reviewed  Assessment: Gastroparesis in patient with multiple medical problems help with Botox injection in the past  Plan: Okay to proceed with endoscopy with anesthesia assistance  [facility]  office [phone] After 5PM or if no answer call [phone]

## 2022-02-28 NOTE — Op Note (Signed)
[facility] Patient Name: Valerie [name] Procedure Date: 02/28/2022 MRN: [id number] Attending MD: Clarene Essex , MD Date of Birth: 07/06/1960 CSN: [id number] Age: 61 Admit Type: Outpatient Procedure:                Upper GI endoscopy Indications:              For therapy of gastroparesis Providers:                Clarene Essex, MD, Allayne Gitelman, RN, Frazier Richards,                            Technician Referring MD:              Medicines:                Monitored Anesthesia Care Complications:            No immediate complications. Estimated Blood Loss:     Estimated blood loss: none. Procedure:                Pre-Anesthesia Assessment:                           - Prior to the procedure, a History and Physical                            was performed, and patient medications and                            allergies were reviewed. The patient's tolerance of                            previous anesthesia was also reviewed. The risks                            and benefits of the procedure and the sedation                            options and risks were discussed with the patient.                            All questions were answered, and informed consent                            was obtained. Prior Anticoagulants: The patient has                            taken no previous anticoagulant or antiplatelet                            agents. ASA Grade Assessment: III - A patient with                            severe systemic disease. After reviewing the risks  and benefits, the patient was deemed in                            satisfactory condition to undergo the procedure.                           After obtaining informed consent, the endoscope was                            passed under direct vision. Throughout the                            procedure, the patient's blood pressure, pulse, and                            oxygen saturations were  monitored continuously. The                            [id number]-H190 ([id number]) Olympus endoscope was introduced                            through the mouth, and advanced to the third part                            of duodenum. The upper GI endoscopy was                            accomplished without difficulty. The patient                            tolerated the procedure well. Scope In: Scope Out: Findings:      The larynx was normal.      A small hiatal hernia was present.      The entire examined stomach was normal. The pylorus area was       successfully injected with 100 units botulinum toxin over 4 injections       of 25 units each in the customary fashion.      The duodenal bulb, first portion of the duodenum, second portion of the       duodenum and third portion of the duodenum were normal.      The exam was otherwise without abnormality. Impression:               - Normal larynx.                           - Small hiatal hernia.                           - Normal stomach. Injected with botulinum toxin                            into pylorus.                           - Normal duodenal bulb, first portion of the  duodenum, second portion of the duodenum and third                            portion of the duodenum.                           - The examination was otherwise normal.                           - No specimens collected. Moderate Sedation:      Not Applicable - Patient had care per Anesthesia. Recommendation:           - Patient has a contact number available for                            emergencies. The signs and symptoms of potential                            delayed complications were discussed with the                            patient. Return to normal activities tomorrow.                            Written discharge instructions were provided to the                            patient.                           - Soft diet today.                            - Continue present medications.                           - Return to GI clinic PRN.                           - Telephone GI clinic if symptomatic PRN. Procedure Code(s):        --- Professional ---                           [phone], Esophagogastroduodenoscopy, flexible,                            transoral; with directed submucosal injection(s),                            any substance Diagnosis Code(s):        --- Professional ---                           K31.84, Gastroparesis                           K44.9, Diaphragmatic hernia without obstruction or  gangrene CPT copyright 2019 American Medical Association. All rights reserved. The codes documented in this report are preliminary and upon coder review may  be revised to meet current compliance requirements. Clarene Essex, MD 02/28/2022 1:50:08 PM This report has been signed electronically. Number of Addenda: 0

## 2022-03-01 ENCOUNTER — Encounter (HOSPITAL_COMMUNITY): Payer: Self-pay | Admitting: Gastroenterology

## 2022-03-01 NOTE — Anesthesia Postprocedure Evaluation (Signed)
Anesthesia Post Note  Patient: MADELENA MATURIN  Procedure(s) Performed: ESOPHAGOGASTRODUODENOSCOPY (EGD) WITH PROPOFOL BOTOX INJECTION     Patient location during evaluation: Endoscopy Anesthesia Type: MAC Level of consciousness: awake and alert Pain management: pain level controlled Vital Signs Assessment: post-procedure vital signs reviewed and stable Respiratory status: spontaneous breathing, nonlabored ventilation, respiratory function stable and patient connected to nasal cannula oxygen Cardiovascular status: stable and blood pressure returned to baseline Postop Assessment: no apparent nausea or vomiting Anesthetic complications: no   No notable events documented.  Last Vitals:  Vitals:   02/28/22 1400 02/28/22 1410  BP: 135/75 130/76  Pulse: 69 69  Resp: 11 11  Temp:    SpO2: 97% 99%    Last Pain:  Vitals:   03/01/22 0941  TempSrc:   PainSc: 0-No pain                 Belenda Cruise P Lamerle Jabs

## 2022-03-15 ENCOUNTER — Telehealth: Payer: Self-pay | Admitting: Neurology

## 2022-03-15 NOTE — Telephone Encounter (Signed)
HST- [employer] medicare no auth req spoke to [employer] ref # [id number].  Patient is scheduled at [facility] for 04/25/22 at 2 pm.  Mailed packet to the patient.

## 2022-04-25 ENCOUNTER — Encounter: Payer: Self-pay | Admitting: Neurology

## 2022-04-25 ENCOUNTER — Telehealth: Payer: Self-pay

## 2022-04-25 DIAGNOSIS — G894 Chronic pain syndrome: Secondary | ICD-10-CM

## 2022-04-25 DIAGNOSIS — G4719 Other hypersomnia: Secondary | ICD-10-CM

## 2022-04-25 DIAGNOSIS — F1190 Opioid use, unspecified, uncomplicated: Secondary | ICD-10-CM

## 2022-04-25 DIAGNOSIS — G4733 Obstructive sleep apnea (adult) (pediatric): Secondary | ICD-10-CM

## 2022-04-25 NOTE — Telephone Encounter (Signed)
Patient arrived to pick up home sleep study device. Upon instruction of how to use device patient was told not to wear her cpap machine during home sleep study. Patient became concerned and did not feel comfortable with the idea of not using a cpap machine during the night. I explained to patient why the cpap machine should not be worn during home sleep study and patient was still hesitant and became addiment that she not do the sleep study.Pt is concerned over living alone and how frequent she stops breathing in the night while not using a cpap machine. Pt asked if something else could be done. I let the patient know I could send a note to Dr. Edwena Felty team regarding her concerns and see what Dr. Brett Fairy (or her team) suggest the patient do. Patient was okay with this and asked to cancel HST appt until she has an answer. Appt has been cancelled. Please let myself or Raquel Sarna know if an auth for an inlab sleep study should be submitted to insurance or if pt can receive new machine without sleep study.

## 2022-04-25 NOTE — Addendum Note (Signed)
Addended by: Wyvonnia Lora on: 04/25/2022 02:37 PM   Modules accepted: Orders

## 2022-04-25 NOTE — Telephone Encounter (Signed)
Called pt. She is unwilling to do HST w/o CPAP. Also explained to her why this would not be good to do. She is afraid to not use CPAP for one night. She lives alone.   She wants to do in lab sleep study instead. Aware I will place order. Sleep lab has to get approval first and then will call to get her scheduled if approved. She verbalized understanding.   I placed order.

## 2022-04-26 ENCOUNTER — Telehealth: Payer: Self-pay | Admitting: Neurology

## 2022-04-26 NOTE — Telephone Encounter (Signed)
[employer] medicare pending uploaded notes

## 2022-04-27 NOTE — Telephone Encounter (Signed)
Still pending

## 2022-05-01 NOTE — Telephone Encounter (Signed)
Still pending

## 2022-05-10 NOTE — Telephone Encounter (Signed)
Sleep lab has received notice from insurance company that the in-lab sleep study request(on patient's behalf) has been denied. Copy of denial is attached below this message. I called patient today to explain the denial letter. Patient interrupted me in mid-sentence, stating that she has already spoken with someone with her insurance company. Pt states that insurance rep told her that the reason it was denied is because the sleep lab did not submit requested paperwork in a timely manner. Pt then stated that she is not wanting nor is she trying to get new CPAP equipment. Patient states that Dr. Brett Fairy told her that she is wanting this study to determine if she is on the appropriate CPAP setting. What the patient is trying to explain to me is not documented in the office visit notes that occurred on 02/07/22. It is clear that there  is a miscommunication or misunderstanding between the patient and Dr. Brett Fairy. I tried my best to explain Dr. Edwena Felty notes and the process we have to follow per insurance guideline and patient hung up on me. The sleep lab has done all they can do ,with the information that Dr. Brett Fairy has provided in her notes. Sleep lab will wait to hear from patient on how she will like to proceed with testing. Insurance is allowing for a HST, however patient is refusing this test at this time. Pt is demanding an inlab study, however insurance is denying this request. See copy of denial below on why they have denied the inlab request.

## 2022-05-11 NOTE — Telephone Encounter (Signed)
Spoke with [facility] Medical Director, Star Age, MD this afternoon in regards to this patient. Dr. Rexene Alberts is recommending the following options for the patient: 1. Pt can wait for Dr. Edwena Felty return to work at the end of January 2024 and the two of them can discuss this denial of in lab sleep study. 2. Pt can proceed with the HST, which is what Dr. Brett Fairy has recommended and ordered. 3. Pt can pay for the in lab study at out of pocket cost. I will be sending this information through MyChart for patient to decide how she would like to proceed.

## 2022-09-02 ENCOUNTER — Other Ambulatory Visit: Payer: Self-pay | Admitting: Neurology

## 2023-01-04 ENCOUNTER — Encounter: Payer: Self-pay | Admitting: Neurology

## 2023-01-04 ENCOUNTER — Telehealth: Payer: Self-pay | Admitting: Neurology

## 2023-01-04 NOTE — Telephone Encounter (Signed)
Called Valerie [name] informed her of message nurse [facility] sent. Valerie [name] stated she would just like to see Dr. Vickey Huger because she have other questions for her. I added Valerie [name] to wait-list for sooner appointment.

## 2023-01-04 NOTE — Telephone Encounter (Signed)
Pt called stating that this morning her cpap machine gave her the message that the motor life has been exceeded. Please advise.

## 2023-01-07 ENCOUNTER — Other Ambulatory Visit: Payer: Self-pay | Admitting: Neurology

## 2023-01-07 DIAGNOSIS — Z9189 Other specified personal risk factors, not elsewhere classified: Secondary | ICD-10-CM

## 2023-01-07 DIAGNOSIS — G4719 Other hypersomnia: Secondary | ICD-10-CM

## 2023-01-07 DIAGNOSIS — R768 Other specified abnormal immunological findings in serum: Secondary | ICD-10-CM

## 2023-01-07 DIAGNOSIS — G894 Chronic pain syndrome: Secondary | ICD-10-CM

## 2023-01-07 DIAGNOSIS — G4733 Obstructive sleep apnea (adult) (pediatric): Secondary | ICD-10-CM

## 2023-01-07 NOTE — Telephone Encounter (Signed)
   Sunday, 01-07-2023-  telephone call-  I went ahead and ordered a new auto- cpap at [employer], she is afraid of a HST, and we ordered in lab study last August but insurance denied it. She gained weight since she started insulin and this may help to get an in lab titration covered.  I re- ordered.  She also likes to change from [employer] [facility]. Melvyn Novas, MD

## 2023-01-07 NOTE — Telephone Encounter (Signed)
Forwarded to care team.

## 2023-01-09 ENCOUNTER — Ambulatory Visit: Payer: Medicare HMO | Admitting: Family Medicine

## 2023-01-09 NOTE — Telephone Encounter (Signed)
Order sent to [facility].

## 2023-01-09 NOTE — Telephone Encounter (Addendum)
I have submitted the old notes and sleep study from 2014 and last years notes from aug and hopefully they can use that to get her set up with the new machine. Otherwise we will have to do what I originally said and have pt come in sooner for an apt, order SS and then go from there

## 2023-01-10 ENCOUNTER — Encounter: Payer: Self-pay | Admitting: Neurology

## 2023-01-10 ENCOUNTER — Telehealth (INDEPENDENT_AMBULATORY_CARE_PROVIDER_SITE_OTHER): Payer: Medicare HMO | Admitting: Neurology

## 2023-01-10 DIAGNOSIS — F332 Major depressive disorder, recurrent severe without psychotic features: Secondary | ICD-10-CM

## 2023-01-10 DIAGNOSIS — K861 Other chronic pancreatitis: Secondary | ICD-10-CM | POA: Diagnosis not present

## 2023-01-10 DIAGNOSIS — M318 Other specified necrotizing vasculopathies: Secondary | ICD-10-CM

## 2023-01-10 DIAGNOSIS — Z9989 Dependence on other enabling machines and devices: Secondary | ICD-10-CM | POA: Diagnosis not present

## 2023-01-10 DIAGNOSIS — I5032 Chronic diastolic (congestive) heart failure: Secondary | ICD-10-CM

## 2023-01-10 DIAGNOSIS — E2749 Other adrenocortical insufficiency: Secondary | ICD-10-CM

## 2023-01-10 DIAGNOSIS — K50111 Crohn's disease of large intestine with rectal bleeding: Secondary | ICD-10-CM

## 2023-01-10 DIAGNOSIS — J683 Other acute and subacute respiratory conditions due to chemicals, gases, fumes and vapors: Secondary | ICD-10-CM | POA: Insufficient documentation

## 2023-01-10 DIAGNOSIS — I7782 Antineutrophilic cytoplasmic antibody (ANCA) vasculitis: Secondary | ICD-10-CM

## 2023-01-10 MED ORDER — MODAFINIL 200 MG PO TABS: 100.0000 mg | ORAL_TABLET | Freq: Two times a day (BID) | ORAL | 5 refills | Status: DC

## 2023-01-10 NOTE — Progress Notes (Signed)
Virtual Visit via Video Note  I connected with Valerie [name] on 01/10/23 at  1:00 PM EDT by a video enabled telemedicine application and verified that I am speaking with the correct person using two identifiers.  Location: Patient: at home Provider: at her office    I discussed the limitations of evaluation and management by telemedicine and the availability of in person appointments. The patient expressed understanding and agreed to proceed.  History of Present Illness:  Long term CPAP auto PAP user and her machine displays now a message " end of motor life'.     Observations/Objective: OSA with CPAP use, obesity, PTSD history.   "I do wake up at night, but go back to sleep. "  FSS 38 and up. Takes modafinil   Epworth score : 13/ 24 points   Her download is documenting a great compliance: 7 h 30 minutes, 6-17 cm water range with 2 cm EPR,  AHI 2.4/h 95% pressure 12.8 cm water.  I am ordering a in lab baseline  as the patient is unable / anxious to do HST as she is living alone and doesn't feel safe to sleep a night alone without CPAP. She is a 1000% compliant user.   She has no friends that could stay over and nobody where she could spent a night.    She would ask for exemption form [employer] medicare to be allowed to come in for in lab attended baseline and wants a new CPAP as her old one is displaying the end of motor life message. .    Assessment and Plan:  1) OSA on CPAP - I ordered in lab study, she has declined my repeated HSTs and I will order a new CPAP auto machine after baseline.   She is having blood work at PCP office now, concern about increased creatinine, low RBC.    Follow Up Instructions: Rv in 3- months after new CPAP has started-   Attended sleep study pending. [employer].     I discussed the assessment and treatment plan with the patient. The patient was provided an opportunity to ask questions and all were answered. The patient agreed with the plan and  demonstrated an understanding of the instructions.   The patient was advised to call back or seek an in-person evaluation if the symptoms worsen or if the condition fails to improve as anticipated.  I provided 16 minutes of non-face-to-face time during this encounter.   Melvyn Novas, MD

## 2023-01-16 ENCOUNTER — Telehealth: Payer: Self-pay | Admitting: Neurology

## 2023-01-16 NOTE — Telephone Encounter (Signed)
[employer] medicare pending

## 2023-01-22 NOTE — Telephone Encounter (Signed)
[employer] Medicare has denied the CPAP there is an option for a p2p but it looks like it would have to be done by 01/26/2023 and this patient refuse's to do a Home sleep study.

## 2023-02-08 ENCOUNTER — Ambulatory Visit: Payer: Medicare HMO | Admitting: Neurology

## 2023-03-20 ENCOUNTER — Other Ambulatory Visit: Payer: Self-pay | Admitting: Gastroenterology

## 2023-03-21 ENCOUNTER — Other Ambulatory Visit: Payer: Self-pay | Admitting: Gastroenterology

## 2023-04-03 ENCOUNTER — Encounter: Payer: Self-pay | Admitting: Neurology

## 2023-04-03 ENCOUNTER — Ambulatory Visit: Payer: Medicare HMO | Admitting: Neurology

## 2023-04-03 VITALS — BP 131/82 | HR 78 | Ht 65.0 in | Wt 320.0 lb

## 2023-04-03 DIAGNOSIS — Z9989 Dependence on other enabling machines and devices: Secondary | ICD-10-CM

## 2023-04-03 DIAGNOSIS — G4733 Obstructive sleep apnea (adult) (pediatric): Secondary | ICD-10-CM

## 2023-04-03 DIAGNOSIS — Z9189 Other specified personal risk factors, not elsewhere classified: Secondary | ICD-10-CM | POA: Diagnosis not present

## 2023-04-03 MED ORDER — MODAFINIL 200 MG PO TABS: 100.0000 mg | ORAL_TABLET | Freq: Two times a day (BID) | ORAL | 5 refills | Status: AC

## 2023-04-03 NOTE — Progress Notes (Signed)
Provider:  Melvyn Novas, MD  Primary Care Physician:  Porfirio Oar, PA [address] [place] [address]     Referring Provider: Porfirio Oar, Pa [address] [place],  [place] [address]          Chief Complaint according to patient   Patient presents with:     New CPAP, no baseline study- didn't bring the machine to appointment.            HISTORY OF PRESENT ILLNESS:  Valerie [name] is a 62 y.o. female patient who is here for revisit 04/03/2023 for new CPAP- .  Chief concern according to patient :  I finally got the S 11 , but an S 10 had been set up for me first. I liked the N 20 mask, and I will stay with it.  I have the pleasure of seeing Valerie [name] today who has been 100% compliant user of her new machine which was set up in September.  She is using [facility] home services now.  Her compliance is 100% for days and 100% 4 hours with an average of 7 hours 30 minutes.  Her AutoSet is open between 5 and 20 cm water pressure with 3 cm expiratory pressure relief.  Her residual AHI is 2.0 which is excellent.  She does have above average air leak but it does not lead to more apnea and therefore I cannot ignore it.  Her 95th percentile pressure is 12 cm water.  The AHI has actually been lower since she began using the new machine.    The air leak at some nights is high but that may be a displacement of the large dislodging of the mask. She needs a new cushion.       Pt alone, rm 11. Overall stable with CPAP and tolerating well. Current machine set up 11/05/2015. Eligible for a new machine. Current DME [facility].      Other            02-07-2022: RV for Valerie [name], 62 year-old patient . This patient's primary neurologist was Dr. Lesia Sago, who is now retired. This patient was in chronic pain care and took opiates, was considered at high risk for central and obstructive apneas. I had requested a repeat sleep study for baseline  evaluation- she has LVH and morbid obesity, too.   Her BMI is still high, but she was able to wean off the narcotics.  I congratulated Mr. Handke and her ability to wean off narcotic pain medication.  She has remained a compliant user of CPAP is 97% compliance by days 90% compliance by hours this year.  They have been a water damage to her apartment and she had to move into a recliner for couple of days,  however her average user time is still 7 hours 10 minutes and that fulfills insurance criteria.   She is using an AutoSet from 2017 -between 6 and 17 cm of water pressure was 2 cm EPR and achieved a residual AHI or apnea hypopnea index, of 1.9/h.  Obstructive apneas of the majority of the residual.    New patient referral on 11 Oct 2017:  I had the pleasure of meeting with Valerie [name] today who has been a long-standing CPAP user.  Valerie [name] is a patient of Dr. Anne Hahn, MD,  She continues to be excessively daytime sleepy her Epworth score is between 10 and 12,  she also struggles with fatigue and endorses fatigue severity score of 51 points.  Last year she had tested positive for a P ANCA, a repeat test however was negative she was referred to [facility]ology and was diagnosed with an HLA B 27, likely reflecting ankylosing spondylitis.  She underwent ECHO cardiography, LV hypertrophy, normal EF.  Review of Systems: Out of a complete 14 system review, the patient complains of only the following symptoms, and all other reviewed systems are negative.:  Fatigue, sleepiness , snoring, fragmented sleep, Insomnia, RLS, Nocturia    How likely are you to doze in the following situations: 0 = not likely, 1 = slight chance, 2 = moderate chance, 3 = high chance   Sitting and Reading? Watching Television? Sitting inactive in a public place (theater or meeting)? As a passenger in a car for an hour without a break? Lying down in the afternoon when circumstances permit? Sitting and talking to someone? Sitting  quietly after lunch without alcohol? In a car, while stopped for a few minutes in traffic?   Total = 8.5 / 24 points   FSS endorsed at NA/ 63 points- was not filled out .  GDS was not filled out.   Machine was not brought to this visit.   Social History   Socioeconomic History   Marital status: Single    Spouse name: Not on file   Number of children: 0   Years of education: College   Highest education level: Not on file  Occupational History   Occupation: disabled  Tobacco Use   Smoking status: Never   Smokeless tobacco: Never  Vaping Use   Vaping status: Never Used  Substance and Sexual Activity   Alcohol use: No   Drug use: No   Sexual activity: Not Currently  Other Topics Concern   Not on file  Social History Narrative   Caffeine 2 cups iced tea daily avg.   Lives alone   Right handed   Social Determinants of Health   Financial Resource Strain: High Risk (09/05/2022)   Received from [facility]   Overall Financial Resource Strain (CARDIA)    Difficulty of Paying Living Expenses: Hard  Food Insecurity: Patient Declined (09/05/2022)   Received from [facility]   Hunger Vital Sign    Worried About Running Out of Food in the Last Year: Patient declined    Ran Out of Food in the Last Year: Patient declined  Transportation Needs: No Transportation Needs (09/05/2022)   Received from [facility] - Transportation    Lack of Transportation (Medical): No    Lack of Transportation (Non-Medical): No  Physical Activity: Insufficiently Active (09/05/2022)   Received from [facility]   Exercise Vital Sign    Days of Exercise per Week: 1 day    Minutes of Exercise per Session: 60 min  Stress: Patient Declined (09/05/2022)   Received from [facility] - Occupational Stress Questionnaire    Feeling of Stress : Patient declined  Social Connections: Moderately Integrated (09/05/2022)   Received from [facility]    Social Network    How would you rate your social network (family, work, friends)?: Adequate participation with social networks    Family History  Problem Relation Age of Onset   Heart disease Mother    COPD Mother    Arthritis Mother    Cancer Mother    Hypertension Mother    Arthritis Paternal Grandmother  Heart disease Paternal Grandmother    Heart disease Paternal Grandfather     Past Medical History:  Diagnosis Date   Acute ischemic colitis (HCC)    Anemia 05/01/2012   Anxiety    ARF (acute renal failure) (HCC) 12/28/2015   june acute kidney injury and july minor kidney injury 2020 also kidneys recovering   Arthritis    "basically ~ q joint" (12/28/2015)   Asthma    Basal cell carcinoma of upper back ~ 2011   Chronic kidney disease (CKD) stage G2/A1, mildly decreased glomerular filtration rate (GFR) between 60-89 mL/min/1.73 square meter and albuminuria creatinine ratio less than 30 mg/g    sees [facility] q year   Chronic right-sided headaches    and neck pain, relieved wiht PT and chronic practic manipulation   Complete tear of right rotator cuff, recurrent 04/09/2018   DDD (degenerative disc disease), lumbar    Diabetes mellitus without complication (HCC)    Diarrhea    EPISODIC   Fatigue    h/o excessive sedation, etiology unknown   Fibromyalgia    L3 nerve damage and right arm   First degree heart block    GERD (gastroesophageal reflux disease) 04/06/2017   Headache(784.0)    "muscular w/migraine intensity; ~ 5-6 months" (12/28/2015)   History of hiatal hernia    History of ischemic colitis 09/24/2016   x 2   Hypertension    Mental disorder    reports PTSD   Microscopic polyangiitis (HCC)    Nerve pain 2011   nerve damage to right arm; normal EMG/NCV to RUExt after shoulder surgery 12/2009   Other bursal cyst, right shoulder 04/09/2018   Pancreatitis last time yrs ago   due to Effexor   Pneumonia ~ 2014; 08/2015   Aspiration   Polymyalgia rheumatica  (HCC) onset 08/2015   changed diagnosis to MPA, auto immune disorder resolved   Precancerous melanosis (HCC)    "2nd toe left foot; 5th toe of right foot; right temple; had them all cut off"   Sleep apnea    CPAP 5 to 15    Past Surgical History:  Procedure Laterality Date   BASAL CELL CARCINOMA EXCISION  ~ 2011   back   BOTOX INJECTION N/A 11/06/2012   Procedure: BOTOX INJECTION;  Surgeon: Barrie Folk, MD;  Location: [facility] ENDOSCOPY;  Service: Endoscopy;  Laterality: N/A;   BOTOX INJECTION N/A 12/04/2013   Procedure: BOTOX INJECTION;  Surgeon: Barrie Folk, MD;  Location: [facility] ENDOSCOPY;  Service: Endoscopy;  Laterality: N/A;   BOTOX INJECTION N/A 02/26/2014   Procedure: BOTOX INJECTION;  Surgeon: Barrie Folk, MD;  Location: [facility] ENDOSCOPY;  Service: Endoscopy;  Laterality: N/A;   BOTOX INJECTION N/A 07/08/2015   Procedure: BOTOX INJECTION;  Surgeon: Dorena Cookey, MD;  Location: [facility] ENDOSCOPY;  Service: Endoscopy;  Laterality: N/A;   BOTOX INJECTION N/A 04/27/2016   Procedure: BOTOX INJECTION;  Surgeon: Dorena Cookey, MD;  Location: [facility] ENDOSCOPY;  Service: Endoscopy;  Laterality: N/A;   BOTOX INJECTION N/A 04/09/2017   Procedure: BOTOX INJECTION;  Surgeon: Vida Rigger, MD;  Location: [facility] ENDOSCOPY;  Service: Endoscopy;  Laterality: N/A;   BOTOX INJECTION  02/28/2022   Procedure: BOTOX INJECTION;  Surgeon: Vida Rigger, MD;  Location: [facility] ENDOSCOPY;  Service: Gastroenterology;;   CATARACT EXTRACTION Right    ESOPHAGOGASTRODUODENOSCOPY  12/21/2011   Procedure: ESOPHAGOGASTRODUODENOSCOPY (EGD);  Surgeon: Barrie Folk, MD;  Location: [facility] ENDOSCOPY;  Service: Endoscopy;  Laterality: N/A;   ESOPHAGOGASTRODUODENOSCOPY N/A 11/06/2012  Procedure: ESOPHAGOGASTRODUODENOSCOPY (EGD);  Surgeon: Barrie Folk, MD;  Location: [facility] ENDOSCOPY;  Service: Endoscopy;  Laterality: N/A;   ESOPHAGOGASTRODUODENOSCOPY N/A 12/04/2013   Procedure: ESOPHAGOGASTRODUODENOSCOPY (EGD);  Surgeon: Barrie Folk, MD;  Location: [facility]  ENDOSCOPY;  Service: Endoscopy;  Laterality: N/A;   ESOPHAGOGASTRODUODENOSCOPY N/A 02/26/2014   Procedure: ESOPHAGOGASTRODUODENOSCOPY (EGD);  Surgeon: Barrie Folk, MD;  Location: [facility] ENDOSCOPY;  Service: Endoscopy;  Laterality: N/A;   ESOPHAGOGASTRODUODENOSCOPY N/A 04/09/2017   Procedure: ESOPHAGOGASTRODUODENOSCOPY (EGD);  Surgeon: Vida Rigger, MD;  Location: [facility] ENDOSCOPY;  Service: Endoscopy;  Laterality: N/A;   ESOPHAGOGASTRODUODENOSCOPY (EGD) WITH PROPOFOL N/A 07/08/2015   Procedure: ESOPHAGOGASTRODUODENOSCOPY (EGD) WITH PROPOFOL;  Surgeon: Dorena Cookey, MD;  Location: [facility] ENDOSCOPY;  Service: Endoscopy;  Laterality: N/A;   ESOPHAGOGASTRODUODENOSCOPY (EGD) WITH PROPOFOL N/A 04/27/2016   Procedure: ESOPHAGOGASTRODUODENOSCOPY (EGD) WITH PROPOFOL;  Surgeon: Dorena Cookey, MD;  Location: [facility] ENDOSCOPY;  Service: Endoscopy;  Laterality: N/A;   ESOPHAGOGASTRODUODENOSCOPY (EGD) WITH PROPOFOL N/A 03/06/2019   Procedure: ESOPHAGOGASTRODUODENOSCOPY (EGD) WITH PROPOFOL;  Surgeon: Vida Rigger, MD;  Location: [facility] ENDOSCOPY;  Service: Endoscopy;  Laterality: N/A;  with botox injection   ESOPHAGOGASTRODUODENOSCOPY (EGD) WITH PROPOFOL N/A 02/28/2022   Procedure: ESOPHAGOGASTRODUODENOSCOPY (EGD) WITH PROPOFOL;  Surgeon: Vida Rigger, MD;  Location: [facility] ENDOSCOPY;  Service: Gastroenterology;  Laterality: N/A;  with Botox injection   EXCISION MASS UPPER EXTREMETIES Right 04/09/2018   Procedure: EXCISION RIGHT SHOULDER CYST;  Surgeon: Teryl Lucy, MD;  Location: [facility] ORS;  Service: Orthopedics;  Laterality: Right;  Interscalene Block   FLEXIBLE SIGMOIDOSCOPY  05/01/2012   Procedure: FLEXIBLE SIGMOIDOSCOPY;  Surgeon: Petra Kuba, MD;  Location: [facility] ENDOSCOPY;  Service: Endoscopy;  Laterality: N/A;  unprepped   FLEXIBLE SIGMOIDOSCOPY Left 09/28/2016   Procedure: FLEXIBLE SIGMOIDOSCOPY;  Surgeon: Charlott Rakes, MD;  Location: [facility] ENDOSCOPY;  Service: Endoscopy;  Laterality: Left;   LAPAROSCOPIC CHOLECYSTECTOMY  01/2002    NASAL SEPTOPLASTY W/ TURBINOPLASTY  1998   RETINAL TEAR REPAIR CRYOTHERAPY     SHOULDER ARTHROSCOPY WITH ROTATOR CUFF REPAIR Right 04/09/2018   Procedure: RIGHT SHOULDER ARTHROSCOPY WITH EXTENSIVE DEBRIDEMENT;  Surgeon: Teryl Lucy, MD;  Location: [facility] ORS;  Service: Orthopedics;  Laterality: Right;  Interscalene Block   SHOULDER ARTHROSCOPY WITH ROTATOR CUFF REPAIR AND OPEN BICEPS TENODESIS Bilateral 2010-2013   left-right   SUBMUCOSAL INJECTION  03/06/2019   Procedure: SUBMUCOSAL INJECTION;  Surgeon: Vida Rigger, MD;  Location: [facility] ENDOSCOPY;  Service: Endoscopy;;     Current Outpatient Medications on File Prior to Visit  Medication Sig Dispense Refill   albuterol (PROAIR HFA) 108 (90 Base) MCG/ACT inhaler Inhale 2 puffs into the lungs every 4 (four) hours as needed for wheezing or shortness of breath. 8.5 each 11   allopurinol (ZYLOPRIM) 300 MG tablet TAKE 1/2 TABLET BY MOUTH ONCE A DAY (Patient taking differently: Take 300 mg by mouth daily.) 45 tablet 0   ALPRAZolam (XANAX) 1 MG tablet Take 1 tablet (1 mg total) by mouth 3 (three) times daily as needed. for anxiety 90 tablet 1   azelastine (ASTELIN) 0.1 % nasal spray Place 2 sprays into both nostrils 2 (two) times daily. Use in each nostril as directed     baclofen (LIORESAL) 10 MG tablet Take 10 mg by mouth 3 (three) times daily as needed for muscle spasms.      benazepril (LOTENSIN) 40 MG tablet Take 1 tablet (40 mg total) by mouth daily. 90 tablet 1   CARAFATE 1 GM/10ML suspension TAKE 10 ML'S BY MOUTH FOUR TIMES A DAY AS NEEDED  FOR STOMACH PAIN (Patient taking differently: Take 1 g by mouth 3 (three) times daily as needed (burning in esophagus).) 420 mL 1   carboxymethylcellulose (REFRESH PLUS) 0.5 % SOLN Place 1 drop into both eyes 3 (three) times daily as needed (dry eyes).     cetirizine (ZYRTEC) 10 MG tablet Take 10 mg by mouth daily.      Cholecalciferol (VITAMIN D3) 5000 units CAPS Take 5,000 Units by mouth daily.       dexlansoprazole (DEXILANT) 60 MG capsule Take 60 mg by mouth daily.      fluticasone (FLONASE) 50 MCG/ACT nasal spray PLACE 2 SPRAYS INTO BOTH NOSTRILS DAILY 48 g 1   furosemide (LASIX) 20 MG tablet Take 20 mg by mouth daily as needed (fluid retention.).     HYDROcodone-acetaminophen (NORCO) 10-325 MG per tablet Take 1-2 tablets by mouth every 6 (six) hours as needed for moderate pain (pain.).      hydrOXYzine (ATARAX/VISTARIL) 10 MG tablet Take 1-3 tablets (10-30 mg total) by mouth 3 (three) times daily as needed for anxiety. 180 tablet 1   ibuprofen (ADVIL,MOTRIN) 600 MG tablet Take 600 mg by mouth daily as needed (migraines).     Insulin Glargine (BASAGLAR KWIKPEN) 100 UNIT/ML Inject 35 Units into the skin every evening.     insulin regular (NOVOLIN R) 100 units/mL injection as directed. CHECK GLUCOSE EVERY 4 HOURS. IF GLUCOSE  <150 GIVE NO INSULIN; IF 150-199 GIVE 2 UNITS [place]; IF 200-249 GIVE 4 UNITS; IF 250-299 GIVE 6 UNITS; IF 300-349 GIVE 8 UNITS; IF 350-399 GIVE 10 UNITS; IF 400 AND ABOVE GIVE 12 UNITS     Melatonin 5 MG TABS Take 10 mg by mouth at bedtime as needed (sleep).     metoprolol succinate (TOPROL-XL) 100 MG 24 hr tablet Take 1 tablet (100 mg total) by mouth daily. Take with or immediately following a meal. 90 tablet 3   modafinil (PROVIGIL) 200 MG tablet Take 0.5 tablets (100 mg total) by mouth 2 (two) times daily. 90 tablet 5   montelukast (SINGULAIR) 10 MG tablet TAKE ONE TABLET BY MOUTH EVERY NIGHT AT BEDTIME (Patient taking differently: Take 10 mg by mouth daily.) 90 tablet 1   Multiple Vitamins-Minerals (MULTIVITAMIN PO) Take 1 tablet by mouth daily.     Olopatadine HCl (PAZEO) 0.7 % SOLN Apply 1 drop to eye daily as needed (allergies). 1 Bottle 5   ondansetron (ZOFRAN) 4 MG tablet Take 1 tablet (4 mg total) by mouth every 8 (eight) hours as needed for nausea or vomiting. 20 tablet 0   Pancrelipase, Lip-Prot-Amyl, 24000-76000 units CPEP Take 24,000-72,000 Units by mouth 3  (three) times daily as needed (for pancreatitis).      promethazine (PHENERGAN) 25 MG tablet Take 1 tablet (25 mg total) by mouth every 6 (six) hours as needed for nausea or vomiting. 30 tablet 1   torsemide (DEMADEX) 10 MG tablet Take 10 mg by mouth daily as needed (fluid).     No current facility-administered medications on file prior to visit.    Allergies  Allergen Reactions   Dilaudid [Hydromorphone Hcl] Other (See Comments)    Headache    Effexor [Venlafaxine]     Pancreatitis, bladder & bowel incontinence   Pregabalin Other (See Comments)    Drug induced stupor   Jardiance [Empagliflozin] Other (See Comments)    "Pancreatic pain."    Adhesive [Tape]     Paper tape causes blood red marks   Metoclopramide Hcl Other (See Comments)    "  crawling inside"   Neurontin [Gabapentin] Other (See Comments)    Uncontrollable twitches and movements   Serotonin Reuptake Inhibitors (Ssris) Other (See Comments)    Damage to pancreas and bowel/urinary incontinence    Metformin And Related Diarrhea    Severe gastropresis      DIAGNOSTIC DATA (LABS, IMAGING, TESTING) - I reviewed patient records, labs, notes, testing and imaging myself where available.  Lab Results  Component Value Date   WBC 11.1 (H) 11/20/2018   HGB 13.9 11/20/2018   HCT 43.0 11/20/2018   MCV 90.7 11/20/2018   PLT 296 11/20/2018      Component Value Date/Time   NA 133 (L) 11/20/2018 0017   NA 137 08/08/2018 1011   K 4.2 11/20/2018 0017   CL 96 (L) 11/20/2018 0017   CO2 24 11/20/2018 0017   GLUCOSE 139 (H) 11/20/2018 0017   BUN 31 (H) 11/20/2018 0017   BUN 22 08/08/2018 1011   CREATININE 2.20 (H) 11/20/2018 0017   CREATININE 1.07 (H) 04/14/2016 1259   CALCIUM 9.8 11/20/2018 0017   PROT 7.0 06/26/2018 1017   ALBUMIN 4.2 06/26/2018 1017   AST 12 06/26/2018 1017   ALT 16 06/26/2018 1017   ALKPHOS 60 06/26/2018 1017   BILITOT 0.3 06/26/2018 1017   GFRNONAA 24 (L) 11/20/2018 0017   GFRNONAA 53 (L)  01/21/2016 1018   GFRAA 28 (L) 11/20/2018 0017   GFRAA 61 01/21/2016 1018   Lab Results  Component Value Date   CHOL 157 02/09/2018   HDL 43 02/09/2018   LDLCALC 76 02/09/2018   TRIG 188 (H) 02/09/2018   CHOLHDL 3.7 02/09/2018   Lab Results  Component Value Date   HGBA1C 6.5 (H) 04/24/2018   Lab Results  Component Value Date   VITAMINB12 946 (H) 12/28/2015   Lab Results  Component Value Date   TSH 1.800 06/26/2018    PHYSICAL EXAM:  Today's Vitals   04/03/23 0943  BP: 131/82  Pulse: 78  Weight: (!) 320 lb (145.2 kg)  Height: 5\' 5"  (1.651 m)   Body mass index is 53.25 kg/m.   Wt Readings from Last 3 Encounters:  04/03/23 (!) 320 lb (145.2 kg)  02/28/22 (!) 307 lb (139.3 kg)  02/07/22 (!) 311 lb (141.1 kg)     Ht Readings from Last 3 Encounters:  04/03/23 5\' 5"  (1.651 m)  02/28/22 5\' 5"  (1.651 m)  02/07/22 5\' 5"  (1.651 m)      General: The patient is awake, alert and appears not in acute distress. The patient is well groomed. Head: Normocephalic, atraumatic. Neck is supple.  She is fatigued, but cooperative.  Head: Normocephalic, atraumatic.  Neck is supple. Large neck but low gradeupper airway restriction. Mallampati 2, narrow laterally , but high , peaked palate - short airway , reddened mucosa.   neck circumference:18. 5 inches! . Cardiovascular:  Regular rate and rhythm, and without distended neck veins. Respiratory: Lungs are clear to auscultation. Skin:  Without evidence of edema, or rash Trunk: BMI is severely elevated ,- slightly  Stooped  posture. morbidly obese.    Neurologic exam : The patient is awake and alert, oriented to place and time. Memory subjective  described as intact. There is a normal attention span & concentration ability.  Speech is fluent without dysarthria, dysphonia or aphasia.  Mood and affect are appropriate.   Cranial nerves: Sense of smell is impaired , and she reported last year a metallic taste in her mouth.  Tongue  no longer "  metallic "- recovered with the recovery from acute renal failure. . Pupils are equal and briskly reactive to light. Extraocular movements  in vertical and horizontal planes intact and without nystagmus. Visual fields by finger perimetry are intact.Hearing to finger rub intact. Facial sensation intact to fine touch. Facial motor strength is symmetric and tongue and uvula move midline.   Motor exam:   She reports pain in both legs with Valsalva!  Normal tone and normal muscle bulk and symmetric normal strength in all extremities. ROM limitation in  Right hip,  Left and right shoulder RO limited.  Sensory:  Fine touch and vibration were normal. Coordination: Rapid alternating movements in the fingers/hands is tested and normal.  Gait and station: Patient walks without assistive device and is able unassisted to climb up to the exam table. Deep tendon reflexes: in the  upper and lower extremities are symmetric and intact. Babinski maneuver response is downgoing.   ASSESSMENT AND PLAN 62 y.o. year old female  here with:    1) CPAP dependence for OSA,  with hypoxia and hypoventilation. Repeat Sleep study in order to obtain an new auto CPAP was denied by insurance (!).  She never sleeps without CPAP.  I will not need to reset the current machine.   2) HLA B 27 positive, suspected to have ankylosing spondylitis. Causing  inflammation, autoimmune reaction, joint pain.   Question : brachial plexus injury - had been seen by Dr. Anne Hahn. EMG / NCS -Not improved .  3) modafinil refilled.   No further orders of the defined types were placed in this encounter.  Shawnie Dapper, NP   Plan :  follow up  through our NP every 12 months.   I would like to thank Porfirio Oar, Pa [address] [place],  [place] [address] for allowing me to meet with and to take care of this pleasant patient.   After spending a total time of  24  minutes face to face and additional time for physical and  neurologic examination, review of laboratory studies,  personal review of imaging studies, reports and results of other testing and review of referral information / records as far as provided in visit,   Electronically signed by: Melvyn Novas, MD 04/03/2023 9:49 AM  [facility] and [facility] Board certified by The [employer] of Sleep Medicine and Diplomate of the [employer] of Sleep Medicine. Board certified In Neurology through the ABPN, Fellow of the [employer] of Neu[employer].

## 2023-04-03 NOTE — Patient Instructions (Signed)

## 2023-04-09 ENCOUNTER — Encounter (HOSPITAL_COMMUNITY): Payer: Self-pay | Admitting: Gastroenterology

## 2023-04-09 ENCOUNTER — Other Ambulatory Visit: Payer: Self-pay

## 2023-04-17 ENCOUNTER — Ambulatory Visit (HOSPITAL_COMMUNITY): Payer: Medicare HMO | Admitting: Anesthesiology

## 2023-04-17 ENCOUNTER — Ambulatory Visit (HOSPITAL_BASED_OUTPATIENT_CLINIC_OR_DEPARTMENT_OTHER): Payer: Medicare HMO | Admitting: Anesthesiology

## 2023-04-17 ENCOUNTER — Other Ambulatory Visit: Payer: Self-pay

## 2023-04-17 ENCOUNTER — Encounter (HOSPITAL_COMMUNITY)
Admission: RE | Disposition: A | Discharge status: Home / Self Care | Payer: Self-pay | Source: Home / Self Care | Attending: Gastroenterology

## 2023-04-17 ENCOUNTER — Ambulatory Visit (HOSPITAL_COMMUNITY)
Admission: RE | Admit: 2023-04-17 | Discharge: 2023-04-17 | Disposition: A | Discharge status: Home / Self Care | Payer: Medicare HMO | Attending: Gastroenterology | Admitting: Gastroenterology

## 2023-04-17 ENCOUNTER — Encounter (HOSPITAL_COMMUNITY): Payer: Self-pay | Admitting: Gastroenterology

## 2023-04-17 DIAGNOSIS — I509 Heart failure, unspecified: Secondary | ICD-10-CM | POA: Diagnosis not present

## 2023-04-17 DIAGNOSIS — M797 Fibromyalgia: Secondary | ICD-10-CM | POA: Insufficient documentation

## 2023-04-17 DIAGNOSIS — E1122 Type 2 diabetes mellitus with diabetic chronic kidney disease: Secondary | ICD-10-CM

## 2023-04-17 DIAGNOSIS — E1143 Type 2 diabetes mellitus with diabetic autonomic (poly)neuropathy: Secondary | ICD-10-CM | POA: Diagnosis present

## 2023-04-17 DIAGNOSIS — F32A Depression, unspecified: Secondary | ICD-10-CM | POA: Insufficient documentation

## 2023-04-17 DIAGNOSIS — K449 Diaphragmatic hernia without obstruction or gangrene: Secondary | ICD-10-CM | POA: Diagnosis not present

## 2023-04-17 DIAGNOSIS — I110 Hypertensive heart disease with heart failure: Secondary | ICD-10-CM | POA: Diagnosis not present

## 2023-04-17 DIAGNOSIS — M1990 Unspecified osteoarthritis, unspecified site: Secondary | ICD-10-CM | POA: Insufficient documentation

## 2023-04-17 DIAGNOSIS — I130 Hypertensive heart and chronic kidney disease with heart failure and stage 1 through stage 4 chronic kidney disease, or unspecified chronic kidney disease: Secondary | ICD-10-CM | POA: Diagnosis not present

## 2023-04-17 DIAGNOSIS — Z794 Long term (current) use of insulin: Secondary | ICD-10-CM | POA: Diagnosis not present

## 2023-04-17 DIAGNOSIS — G4730 Sleep apnea, unspecified: Secondary | ICD-10-CM | POA: Diagnosis not present

## 2023-04-17 DIAGNOSIS — K3184 Gastroparesis: Secondary | ICD-10-CM | POA: Diagnosis not present

## 2023-04-17 DIAGNOSIS — K219 Gastro-esophageal reflux disease without esophagitis: Secondary | ICD-10-CM | POA: Diagnosis not present

## 2023-04-17 DIAGNOSIS — F419 Anxiety disorder, unspecified: Secondary | ICD-10-CM | POA: Diagnosis not present

## 2023-04-17 DIAGNOSIS — N189 Chronic kidney disease, unspecified: Secondary | ICD-10-CM

## 2023-04-17 DIAGNOSIS — K30 Functional dyspepsia: Secondary | ICD-10-CM | POA: Insufficient documentation

## 2023-04-17 DIAGNOSIS — Z79899 Other long term (current) drug therapy: Secondary | ICD-10-CM | POA: Insufficient documentation

## 2023-04-17 HISTORY — PX: ESOPHAGOGASTRODUODENOSCOPY (EGD) WITH PROPOFOL: SHX5813

## 2023-04-17 HISTORY — PX: BOTOX INJECTION: SHX5754

## 2023-04-17 LAB — GLUCOSE, CAPILLARY: Glucose-Capillary: 87 mg/dL (ref 70–99)

## 2023-04-17 SURGERY — ESOPHAGOGASTRODUODENOSCOPY (EGD) WITH PROPOFOL
Anesthesia: Monitor Anesthesia Care

## 2023-04-17 MED ORDER — SODIUM CHLORIDE 0.9 % IV SOLN: INTRAVENOUS | Status: DC | PRN

## 2023-04-17 MED ORDER — PROPOFOL 10 MG/ML IV BOLUS
INTRAVENOUS | Status: DC | PRN
  Administered 2023-04-17: 20 mg via INTRAVENOUS
  Administered 2023-04-17: 30 mg via INTRAVENOUS

## 2023-04-17 MED ORDER — PROPOFOL 500 MG/50ML IV EMUL
INTRAVENOUS | Status: DC | PRN
  Administered 2023-04-17: 150 ug/kg/min via INTRAVENOUS

## 2023-04-17 MED ORDER — LACTATED RINGERS IV SOLN: INTRAVENOUS | Status: DC | PRN

## 2023-04-17 MED ORDER — ONABOTULINUMTOXINA 100 UNITS IJ SOLR
INTRAMUSCULAR | Status: AC
  Filled 2023-04-17: qty 100

## 2023-04-17 MED ORDER — DEXMEDETOMIDINE HCL IN NACL 80 MCG/20ML IV SOLN
INTRAVENOUS | Status: DC | PRN
  Administered 2023-04-17 (×3): 4 ug via INTRAVENOUS

## 2023-04-17 MED ORDER — SODIUM CHLORIDE 0.9 % IV SOLN: INTRAVENOUS | Status: DC

## 2023-04-17 MED ORDER — PROPOFOL 500 MG/50ML IV EMUL
INTRAVENOUS | Status: AC
Start: 2023-04-17 — End: ?
  Filled 2023-04-17: qty 100

## 2023-04-17 MED ORDER — EPHEDRINE SULFATE (PRESSORS) 50 MG/ML IJ SOLN
INTRAMUSCULAR | Status: DC | PRN
  Administered 2023-04-17 (×2): 5 mg via INTRAVENOUS

## 2023-04-17 MED ORDER — GLYCOPYRROLATE 0.2 MG/ML IJ SOLN
INTRAMUSCULAR | Status: DC | PRN
  Administered 2023-04-17: .1 mg via INTRAVENOUS

## 2023-04-17 MED ORDER — ONDANSETRON HCL 4 MG/2ML IJ SOLN
INTRAMUSCULAR | Status: DC | PRN
  Administered 2023-04-17: 4 mg via INTRAVENOUS

## 2023-04-17 SURGICAL SUPPLY — 15 items

## 2023-04-17 NOTE — Transfer of Care (Signed)
Immediate Anesthesia Transfer of Care Note  Patient: Valerie [name]  Procedure(s) Performed: Procedure(s) with comments: ESOPHAGOGASTRODUODENOSCOPY (EGD) WITH PROPOFOL (N/A) - with botox injections BOTOX INJECTION  Patient Location: Endoscopy Unit  Anesthesia Type:MAC  Level of Consciousness:  sedated, patient cooperative and responds to stimulation  Airway & Oxygen Therapy:Patient Spontanous Breathing and Patient connected to face mask oxgen  Post-op Assessment:  Report given to PACU RN and Post -op Vital signs reviewed and stable  Post vital signs:  Reviewed and stable  Last Vitals:  Vitals:   04/17/23 1007  BP: (!) 157/86  Pulse: 70  Resp: 14  Temp: (!) 36.3 C    Complications: No apparent anesthesia complications

## 2023-04-17 NOTE — Anesthesia Procedure Notes (Signed)
Procedure Name: MAC Date/Time: 04/17/2023 11:24 AM  Performed by: Theodosia Quay, CRNAPre-anesthesia Checklist: Patient identified, Emergency Drugs available, Suction available, Patient being monitored and Timeout performed Oxygen Delivery Method: Simple face mask Placement Confirmation: positive ETCO2

## 2023-04-17 NOTE — Discharge Instructions (Addendum)
Begin with liquids and soft solids and call if GI question or problem otherwise follow-up per our routine in 6 monthsYOU HAD AN ENDOSCOPIC PROCEDURE TODAY: Refer to the procedure report and other information in the discharge instructions given to you for any specific questions about what was found during the examination. If this information does not answer your questions, please call [facility] office at [phone] to clarify.   YOU SHOULD EXPECT: Some feelings of bloating in the abdomen. Passage of more gas than usual. Walking can help get rid of the air that was put into your GI tract during the procedure and reduce the bloating. If you had a lower endoscopy (such as a colonoscopy or flexible sigmoidoscopy) you may notice spotting of blood in your stool or on the toilet paper. Some abdominal soreness may be present for a day or two, also.  DIET: Your first meal following the procedure should be a light meal and then it is ok to progress to your normal diet. A half-sandwich or bowl of soup is an example of a good first meal. Heavy or fried foods are harder to digest and may make you feel nauseous or bloated. Drink plenty of fluids but you should avoid alcoholic beverages for 24 hours. If you had a esophageal dilation, please see attached instructions for diet.    ACTIVITY: Your care partner should take you home directly after the procedure. You should plan to take it easy, moving slowly for the rest of the day. You can resume normal activity the day after the procedure however YOU SHOULD NOT DRIVE, use power tools, machinery or perform tasks that involve climbing or major physical exertion for 24 hours (because of the sedation medicines used during the test).   SYMPTOMS TO REPORT IMMEDIATELY: A gastroenterologist can be reached at any hour. Please call [phone]  for any of the following symptoms:  Following lower endoscopy (colonoscopy, flexible sigmoidoscopy) Excessive amounts of blood in the stool   Significant tenderness, worsening of abdominal pains  Swelling of the abdomen that is new, acute  Fever of 100 or higher  Following upper endoscopy (EGD, EUS, ERCP, esophageal dilation) Vomiting of blood or coffee ground material  New, significant abdominal pain  New, significant chest pain or pain under the shoulder blades  Painful or persistently difficult swallowing  New shortness of breath  Black, tarry-looking or red, bloody stools  FOLLOW UP:  If any biopsies were taken you will be contacted by phone or by letter within the next 1-3 weeks. Call [phone]  if you have not heard about the biopsies in 3 weeks.  Please also call with any specific questions about appointments or follow up tests. YOU HAD AN ENDOSCOPIC PROCEDURE TODAY: Refer to the procedure report and other information in the discharge instructions given to you for any specific questions about what was found during the examination. If this information does not answer your questions, please call [facility] office at [phone] to clarify.

## 2023-04-17 NOTE — Anesthesia Preprocedure Evaluation (Signed)
Anesthesia Evaluation  Patient identified by MRN, date of birth, ID band Patient awake    Reviewed: Allergy & Precautions, H&P , NPO status , Patient's Chart, lab work & pertinent test results  Airway Mallampati: II  TM Distance: >3 FB Neck ROM: Full    Dental no notable dental hx.    Pulmonary neg pulmonary ROS, asthma , sleep apnea and Continuous Positive Airway Pressure Ventilation    Pulmonary exam normal        Cardiovascular Exercise Tolerance: Good hypertension, Pt. on medications and Pt. on home beta blockers +CHF  + dysrhythmias  Rhythm:Regular Rate:Normal     Neuro/Psych  Headaches PSYCHIATRIC DISORDERS Anxiety Depression       GI/Hepatic Neg liver ROS, hiatal hernia,GERD  Medicated,,  Endo/Other  diabetes    Renal/GU CRFnegative Renal ROS  negative genitourinary   Musculoskeletal  (+) Arthritis ,  Fibromyalgia -  Abdominal Normal abdominal exam  (+)   Peds  Hematology negative hematology ROS (+) Blood dyscrasia, anemia   Anesthesia Other Findings   Reproductive/Obstetrics negative OB ROS                             Anesthesia Physical Anesthesia Plan  ASA: 3  Anesthesia Plan: MAC   Post-op Pain Management:    Induction: Intravenous  PONV Risk Score and Plan: 2 and Propofol infusion and Treatment may vary due to age or medical condition  Airway Management Planned: Simple Face Mask, Natural Airway and Nasal Cannula  Additional Equipment: None  Intra-op Plan:   Post-operative Plan:   Informed Consent: I have reviewed the patients History and Physical, chart, labs and discussed the procedure including the risks, benefits and alternatives for the proposed anesthesia with the patient or authorized representative who has indicated his/her understanding and acceptance.     Dental advisory given  Plan Discussed with: CRNA and Anesthesiologist  Anesthesia Plan  Comments:         Anesthesia Quick Evaluation

## 2023-04-17 NOTE — Op Note (Signed)
[facility] Patient Name: Valerie [name] Procedure Date: 04/17/2023 MRN: [id number] Attending MD: Vida Rigger , MD, [id number] Date of Birth: 1960/12/03 CSN: [id number] Age: 62 Admit Type: Outpatient Procedure:                Upper GI endoscopy Indications:              For therapy of gastroparesis Providers:                Vida Rigger, MD, Marge Duncans, RN, Rozetta Nunnery, Technician Referring MD:              Medicines:                Monitored Anesthesia Care Complications:            No immediate complications. Estimated Blood Loss:     Estimated blood loss: none. Procedure:                Pre-Anesthesia Assessment:                           - Prior to the procedure, a History and Physical                            was performed, and patient medications and                            allergies were reviewed. The patient's tolerance of                            previous anesthesia was also reviewed. The risks                            and benefits of the procedure and the sedation                            options and risks were discussed with the patient.                            All questions were answered, and informed consent                            was obtained. Prior Anticoagulants: The patient has                            taken no anticoagulant or antiplatelet agents. ASA                            Grade Assessment: III - A patient with severe                            systemic disease. After reviewing the risks and  benefits, the patient was deemed in satisfactory                            condition to undergo the procedure.                           After obtaining informed consent, the endoscope was                            passed under direct vision. Throughout the                            procedure, the patient's blood pressure, pulse, and                            oxygen  saturations were monitored continuously. The                            [id number]-H190 ([id number]) Olympus endoscope was introduced                            through the mouth, and advanced to the third part                            of duodenum. The upper GI endoscopy was                            accomplished without difficulty. The patient                            tolerated the procedure well. Scope In: Scope Out: Findings:      A Questionable tiny hiatal hernia was present.      The entire examined stomach was normal.      Suspect gastroparesis. Pyloric area was successfully injected with 100       units botulinum toxin.      The duodenal bulb, first portion of the duodenum, second portion of the       duodenum and third portion of the duodenum were normal.      The exam was otherwise without abnormality. Impression:               - Questionable tiny hiatal hernia.                           - Normal stomach.                           - Gastroparesis, idiopathic etiology. Injected                            pylorus with botulinum toxin.                           - Normal duodenal bulb, first portion of the  duodenum, second portion of the duodenum and third                            portion of the duodenum.                           - The examination was otherwise normal.                           - No specimens collected. Moderate Sedation:      Not Applicable - Patient had care per Anesthesia. Recommendation:           - Patient has a contact number available for                            emergencies. The signs and symptoms of potential                            delayed complications were discussed with the                            patient. Return to normal activities tomorrow.                            Written discharge instructions were provided to the                            patient.                           - Soft diet today.                            - Continue present medications.                           - Return to GI clinic PRN.                           - Telephone GI clinic if symptomatic PRN. Procedure Code(s):        --- Professional ---                           [phone], Esophagogastroduodenoscopy, flexible,                            transoral; with directed submucosal injection(s),                            any substance Diagnosis Code(s):        --- Professional ---                           K44.9, Diaphragmatic hernia without obstruction or                            gangrene  K31.84, Gastroparesis CPT copyright 2022 [employer]. All rights reserved. The codes documented in this report are preliminary and upon coder review may  be revised to meet current compliance requirements. Vida Rigger, MD 04/17/2023 11:56:28 AM This report has been signed electronically. Number of Addenda: 0

## 2023-04-17 NOTE — Anesthesia Postprocedure Evaluation (Signed)
Anesthesia Post Note  Patient: Valerie [name]  Procedure(s) Performed: ESOPHAGOGASTRODUODENOSCOPY (EGD) WITH PROPOFOL BOTOX INJECTION     Patient location during evaluation: PACU Anesthesia Type: MAC Level of consciousness: awake and alert Pain management: pain level controlled Vital Signs Assessment: post-procedure vital signs reviewed and stable Respiratory status: spontaneous breathing, nonlabored ventilation, respiratory function stable and patient connected to nasal cannula oxygen Cardiovascular status: stable and blood pressure returned to baseline Postop Assessment: no apparent nausea or vomiting Anesthetic complications: no   No notable events documented.  Last Vitals:  Vitals:   04/17/23 1200 04/17/23 1210  BP: (!) 109/52 115/66  Pulse: 69 68  Resp: 17 14  Temp:    SpO2: 96% 96%    Last Pain:  Vitals:   04/17/23 1210  TempSrc:   PainSc: 0-No pain                 Burleigh Brockmann

## 2023-04-17 NOTE — Progress Notes (Signed)
Valerie [name] 11:21 AM  Subjective: Patient doing well other than her postprandial nausea vomiting no other medical issues since we have seen her recently in the office no new complaints  Objective: Signs stable afebrile no acute distress exam please see preassessment evaluation  Assessment: Delayed gastric emptying help with pyloric Botox in the past  Plan: Okay to proceed with endoscopy with Botox with anesthesia assistance  [facility]  office [phone] After 5PM or if no answer call [phone]

## 2023-04-19 ENCOUNTER — Encounter (HOSPITAL_COMMUNITY): Payer: Self-pay | Admitting: Gastroenterology

## 2023-07-25 ENCOUNTER — Telehealth: Payer: Self-pay

## 2023-07-25 NOTE — Telephone Encounter (Signed)
*  [facility]  Pharmacy Patient Advocate Encounter   Received notification from Fax that prior authorization for Modafinil 200MG  tablets  is required/requested.   Insurance verification completed.   The patient is insured through [facility] .   Per test claim: PA required; PA started via CoverMyMeds. KEY [id number] . Please see clinical question(s) below that I am not finding the answer to in her chart and advise.   Has the diagnosis been confirmed by polysomnography?*

## 2023-07-25 NOTE — Telephone Encounter (Signed)
Yes SS on file under procedures from 2014. She is on CPAP and compliant G47.33 is ICD

## 2023-07-26 NOTE — Telephone Encounter (Signed)
Thank you! Request has been submitted to plan and is pending determination.

## 2023-07-30 NOTE — Telephone Encounter (Signed)
PA approved for the patient through [employer] Until 06/11/2024

## 2023-09-03 ENCOUNTER — Encounter (HOSPITAL_COMMUNITY): Payer: Self-pay | Admitting: Emergency Medicine

## 2023-09-03 ENCOUNTER — Other Ambulatory Visit: Payer: Self-pay

## 2023-09-03 ENCOUNTER — Observation Stay (HOSPITAL_COMMUNITY)
Admission: EM | Admit: 2023-09-03 | Discharge: 2023-09-05 | Disposition: A | Discharge status: Home / Self Care | Attending: Internal Medicine | Admitting: Internal Medicine

## 2023-09-03 DIAGNOSIS — G4733 Obstructive sleep apnea (adult) (pediatric): Secondary | ICD-10-CM | POA: Diagnosis not present

## 2023-09-03 DIAGNOSIS — E1122 Type 2 diabetes mellitus with diabetic chronic kidney disease: Secondary | ICD-10-CM | POA: Diagnosis not present

## 2023-09-03 DIAGNOSIS — K589 Irritable bowel syndrome without diarrhea: Secondary | ICD-10-CM | POA: Diagnosis present

## 2023-09-03 DIAGNOSIS — Z85828 Personal history of other malignant neoplasm of skin: Secondary | ICD-10-CM | POA: Insufficient documentation

## 2023-09-03 DIAGNOSIS — N182 Chronic kidney disease, stage 2 (mild): Secondary | ICD-10-CM | POA: Insufficient documentation

## 2023-09-03 DIAGNOSIS — I129 Hypertensive chronic kidney disease with stage 1 through stage 4 chronic kidney disease, or unspecified chronic kidney disease: Secondary | ICD-10-CM | POA: Diagnosis not present

## 2023-09-03 DIAGNOSIS — Z79899 Other long term (current) drug therapy: Secondary | ICD-10-CM | POA: Insufficient documentation

## 2023-09-03 DIAGNOSIS — E1140 Type 2 diabetes mellitus with diabetic neuropathy, unspecified: Secondary | ICD-10-CM | POA: Diagnosis present

## 2023-09-03 DIAGNOSIS — K625 Hemorrhage of anus and rectum: Principal | ICD-10-CM | POA: Diagnosis present

## 2023-09-03 DIAGNOSIS — Z794 Long term (current) use of insulin: Secondary | ICD-10-CM | POA: Insufficient documentation

## 2023-09-03 DIAGNOSIS — Z6841 Body Mass Index (BMI) 40.0 and over, adult: Secondary | ICD-10-CM | POA: Insufficient documentation

## 2023-09-03 DIAGNOSIS — F419 Anxiety disorder, unspecified: Secondary | ICD-10-CM | POA: Insufficient documentation

## 2023-09-03 DIAGNOSIS — J45909 Unspecified asthma, uncomplicated: Secondary | ICD-10-CM | POA: Insufficient documentation

## 2023-09-03 DIAGNOSIS — K219 Gastro-esophageal reflux disease without esophagitis: Secondary | ICD-10-CM | POA: Diagnosis not present

## 2023-09-03 DIAGNOSIS — G4710 Hypersomnia, unspecified: Secondary | ICD-10-CM | POA: Diagnosis not present

## 2023-09-03 DIAGNOSIS — G4719 Other hypersomnia: Secondary | ICD-10-CM | POA: Diagnosis present

## 2023-09-03 DIAGNOSIS — M797 Fibromyalgia: Secondary | ICD-10-CM | POA: Diagnosis not present

## 2023-09-03 DIAGNOSIS — I10 Essential (primary) hypertension: Secondary | ICD-10-CM | POA: Diagnosis present

## 2023-09-03 DIAGNOSIS — K922 Gastrointestinal hemorrhage, unspecified: Principal | ICD-10-CM

## 2023-09-03 LAB — COMPREHENSIVE METABOLIC PANEL
ALT: 18 U/L (ref 0–44)
AST: 19 U/L (ref 15–41)
Albumin: 4.2 g/dL (ref 3.5–5.0)
Alkaline Phosphatase: 50 U/L (ref 38–126)
Anion gap: 9 (ref 5–15)
BUN: 22 mg/dL (ref 8–23)
CO2: 23 mmol/L (ref 22–32)
Calcium: 9.7 mg/dL (ref 8.9–10.3)
Chloride: 105 mmol/L (ref 98–111)
Creatinine, Ser: 1.11 mg/dL — ABNORMAL HIGH (ref 0.44–1.00)
GFR, Estimated: 57 mL/min — ABNORMAL LOW (ref >60)
Glucose, Bld: 116 mg/dL — ABNORMAL HIGH (ref 70–99)
Potassium: 3.6 mmol/L (ref 3.5–5.1)
Sodium: 137 mmol/L (ref 135–145)
Total Bilirubin: 0.5 mg/dL (ref 0.0–1.2)
Total Protein: 7.8 g/dL (ref 6.5–8.1)

## 2023-09-03 LAB — POC OCCULT BLOOD, ED: Fecal Occult Bld: NEGATIVE

## 2023-09-03 LAB — CBC
HCT: 41.6 % (ref 36.0–46.0)
Hemoglobin: 13.4 g/dL (ref 12.0–15.0)
MCH: 30.5 pg (ref 26.0–34.0)
MCHC: 32.2 g/dL (ref 30.0–36.0)
MCV: 94.5 fL (ref 80.0–100.0)
Platelets: 310 10*3/uL (ref 150–400)
RBC: 4.4 MIL/uL (ref 3.87–5.11)
RDW: 13.9 % (ref 11.5–15.5)
WBC: 10.5 10*3/uL (ref 4.0–10.5)
nRBC: 0 % (ref 0.0–0.2)

## 2023-09-03 LAB — TYPE AND SCREEN
ABO/RH(D): B POS
Antibody Screen: NEGATIVE

## 2023-09-03 NOTE — ED Triage Notes (Signed)
 Pt reports rectal bleeding. Multiple episodes of bright red blood in stool. Hx ischemic colitis. Feels weak and diaphoretic

## 2023-09-04 ENCOUNTER — Emergency Department (HOSPITAL_COMMUNITY)

## 2023-09-04 ENCOUNTER — Encounter (HOSPITAL_COMMUNITY): Payer: Self-pay

## 2023-09-04 DIAGNOSIS — K625 Hemorrhage of anus and rectum: Secondary | ICD-10-CM | POA: Diagnosis present

## 2023-09-04 DIAGNOSIS — K580 Irritable bowel syndrome with diarrhea: Secondary | ICD-10-CM | POA: Diagnosis not present

## 2023-09-04 DIAGNOSIS — G4719 Other hypersomnia: Secondary | ICD-10-CM

## 2023-09-04 DIAGNOSIS — M797 Fibromyalgia: Secondary | ICD-10-CM | POA: Diagnosis not present

## 2023-09-04 DIAGNOSIS — E1140 Type 2 diabetes mellitus with diabetic neuropathy, unspecified: Secondary | ICD-10-CM | POA: Diagnosis present

## 2023-09-04 DIAGNOSIS — I10 Essential (primary) hypertension: Secondary | ICD-10-CM

## 2023-09-04 DIAGNOSIS — K219 Gastro-esophageal reflux disease without esophagitis: Secondary | ICD-10-CM

## 2023-09-04 DIAGNOSIS — G4733 Obstructive sleep apnea (adult) (pediatric): Secondary | ICD-10-CM

## 2023-09-04 LAB — HEMOGLOBIN A1C
Hgb A1c MFr Bld: 5.5 % (ref 4.8–5.6)
Mean Plasma Glucose: 111.15 mg/dL

## 2023-09-04 LAB — CBC
HCT: 39.5 % (ref 36.0–46.0)
HCT: 40.4 % (ref 36.0–46.0)
HCT: 37.9 % (ref 36.0–46.0)
Hemoglobin: 12.9 g/dL (ref 12.0–15.0)
Hemoglobin: 13.1 g/dL (ref 12.0–15.0)
Hemoglobin: 12.3 g/dL (ref 12.0–15.0)
MCH: 30.9 pg (ref 26.0–34.0)
MCH: 30.7 pg (ref 26.0–34.0)
MCH: 31.1 pg (ref 26.0–34.0)
MCHC: 32.7 g/dL (ref 30.0–36.0)
MCHC: 32.4 g/dL (ref 30.0–36.0)
MCHC: 32.5 g/dL (ref 30.0–36.0)
MCV: 94.7 fL (ref 80.0–100.0)
MCV: 94.6 fL (ref 80.0–100.0)
MCV: 95.7 fL (ref 80.0–100.0)
Platelets: 268 10*3/uL (ref 150–400)
Platelets: 285 10*3/uL (ref 150–400)
Platelets: 258 10*3/uL (ref 150–400)
RBC: 4.17 MIL/uL (ref 3.87–5.11)
RBC: 4.27 MIL/uL (ref 3.87–5.11)
RBC: 3.96 MIL/uL (ref 3.87–5.11)
RDW: 13.9 % (ref 11.5–15.5)
RDW: 14 % (ref 11.5–15.5)
RDW: 14 % (ref 11.5–15.5)
WBC: 11 10*3/uL — ABNORMAL HIGH (ref 4.0–10.5)
WBC: 9.8 10*3/uL (ref 4.0–10.5)
WBC: 8.5 10*3/uL (ref 4.0–10.5)
nRBC: 0 % (ref 0.0–0.2)
nRBC: 0 % (ref 0.0–0.2)
nRBC: 0 % (ref 0.0–0.2)

## 2023-09-04 LAB — LACTIC ACID, PLASMA: Lactic Acid, Venous: 0.8 mmol/L (ref 0.5–1.9)

## 2023-09-04 LAB — CBG MONITORING, ED
Glucose-Capillary: 125 mg/dL — ABNORMAL HIGH (ref 70–99)
Glucose-Capillary: 98 mg/dL (ref 70–99)

## 2023-09-04 LAB — HIV ANTIBODY (ROUTINE TESTING W REFLEX): HIV Screen 4th Generation wRfx: NONREACTIVE

## 2023-09-04 LAB — GLUCOSE, CAPILLARY: Glucose-Capillary: 116 mg/dL — ABNORMAL HIGH (ref 70–99)

## 2023-09-04 MED ORDER — LACTATED RINGERS IV SOLN: INTRAVENOUS | Status: AC

## 2023-09-04 MED ORDER — MODAFINIL 200 MG PO TABS
100.0000 mg | ORAL_TABLET | Freq: Two times a day (BID) | ORAL | Status: DC
  Administered 2023-09-04 – 2023-09-05 (×2): 100 mg via ORAL
  Filled 2023-09-04 (×2): qty 1

## 2023-09-04 MED ORDER — ALLOPURINOL 300 MG PO TABS
300.0000 mg | ORAL_TABLET | Freq: Every day | ORAL | Status: DC
  Administered 2023-09-04 – 2023-09-05 (×2): 300 mg via ORAL
  Filled 2023-09-04 (×2): qty 1

## 2023-09-04 MED ORDER — LORATADINE 10 MG PO TABS
10.0000 mg | ORAL_TABLET | Freq: Every day | ORAL | Status: DC
  Administered 2023-09-04 – 2023-09-05 (×2): 10 mg via ORAL
  Filled 2023-09-04 (×2): qty 1

## 2023-09-04 MED ORDER — FENTANYL CITRATE PF 50 MCG/ML IJ SOSY
50.0000 ug | PREFILLED_SYRINGE | Freq: Once | INTRAMUSCULAR | Status: AC
  Administered 2023-09-04: 50 ug via INTRAVENOUS
  Filled 2023-09-04: qty 1

## 2023-09-04 MED ORDER — METOPROLOL SUCCINATE ER 100 MG PO TB24
100.0000 mg | ORAL_TABLET | Freq: Every day | ORAL | Status: DC
  Administered 2023-09-04 – 2023-09-05 (×2): 100 mg via ORAL
  Filled 2023-09-04: qty 2
  Filled 2023-09-04: qty 1

## 2023-09-04 MED ORDER — MONTELUKAST SODIUM 10 MG PO TABS
10.0000 mg | ORAL_TABLET | Freq: Every day | ORAL | Status: DC
  Administered 2023-09-04 – 2023-09-05 (×2): 10 mg via ORAL
  Filled 2023-09-04 (×2): qty 1

## 2023-09-04 MED ORDER — ONDANSETRON HCL 4 MG/2ML IJ SOLN
4.0000 mg | Freq: Four times a day (QID) | INTRAMUSCULAR | Status: DC | PRN
  Administered 2023-09-04 – 2023-09-05 (×3): 4 mg via INTRAVENOUS
  Filled 2023-09-04 (×3): qty 2

## 2023-09-04 MED ORDER — INSULIN ASPART 100 UNIT/ML IJ SOLN
0.0000 [IU] | Freq: Three times a day (TID) | INTRAMUSCULAR | Status: DC
  Administered 2023-09-04: 2 [IU] via SUBCUTANEOUS
  Filled 2023-09-04: qty 0.15

## 2023-09-04 MED ORDER — BACLOFEN 10 MG PO TABS
10.0000 mg | ORAL_TABLET | Freq: Three times a day (TID) | ORAL | Status: DC | PRN
  Administered 2023-09-04 – 2023-09-05 (×4): 10 mg via ORAL
  Filled 2023-09-04 (×4): qty 1

## 2023-09-04 MED ORDER — ONDANSETRON HCL 4 MG PO TABS: 4.0000 mg | ORAL_TABLET | Freq: Four times a day (QID) | ORAL | Status: DC | PRN

## 2023-09-04 MED ORDER — VITAMIN D3 25 MCG (1000 UNIT) PO TABS
5000.0000 [IU] | ORAL_TABLET | Freq: Every day | ORAL | Status: DC
  Administered 2023-09-04 – 2023-09-05 (×2): 5000 [IU] via ORAL
  Filled 2023-09-04 (×3): qty 5

## 2023-09-04 MED ORDER — FLUTICASONE PROPIONATE 50 MCG/ACT NA SUSP
2.0000 | Freq: Every day | NASAL | Status: DC
  Administered 2023-09-05: 2 via NASAL
  Filled 2023-09-04: qty 16

## 2023-09-04 MED ORDER — IOHEXOL 300 MG/ML  SOLN
100.0000 mL | Freq: Once | INTRAMUSCULAR | Status: AC | PRN
  Administered 2023-09-04: 100 mL via INTRAVENOUS

## 2023-09-04 MED ORDER — INSULIN ASPART 100 UNIT/ML IJ SOLN
0.0000 [IU] | Freq: Every day | INTRAMUSCULAR | Status: DC
  Filled 2023-09-04: qty 0.05

## 2023-09-04 MED ORDER — ACETAMINOPHEN 325 MG PO TABS: 650.0000 mg | ORAL_TABLET | Freq: Four times a day (QID) | ORAL | Status: DC | PRN

## 2023-09-04 MED ORDER — ACETAMINOPHEN 650 MG RE SUPP: 650.0000 mg | Freq: Four times a day (QID) | RECTAL | Status: DC | PRN

## 2023-09-04 MED ORDER — HYDROCODONE-ACETAMINOPHEN 10-325 MG PO TABS
1.0000 | ORAL_TABLET | Freq: Four times a day (QID) | ORAL | Status: DC | PRN
  Administered 2023-09-04: 1 via ORAL
  Filled 2023-09-04: qty 1

## 2023-09-04 MED ORDER — IBUPROFEN 200 MG PO TABS: 200.0000 mg | ORAL_TABLET | Freq: Four times a day (QID) | ORAL | Status: DC | PRN

## 2023-09-04 MED ORDER — POLYVINYL ALCOHOL 1.4 % OP SOLN: 1.0000 [drp] | Freq: Three times a day (TID) | OPHTHALMIC | Status: DC | PRN

## 2023-09-04 MED ORDER — ONDANSETRON HCL 4 MG/2ML IJ SOLN
4.0000 mg | Freq: Once | INTRAMUSCULAR | Status: AC
  Administered 2023-09-04: 4 mg via INTRAVENOUS
  Filled 2023-09-04: qty 2

## 2023-09-04 MED ORDER — FENTANYL CITRATE PF 50 MCG/ML IJ SOSY
12.5000 ug | PREFILLED_SYRINGE | Freq: Once | INTRAMUSCULAR | Status: AC | PRN
  Administered 2023-09-04: 12.5 ug via INTRAVENOUS
  Filled 2023-09-04: qty 1

## 2023-09-04 MED ORDER — INSULIN GLARGINE-YFGN 100 UNIT/ML ~~LOC~~ SOLN
10.0000 [IU] | Freq: Every day | SUBCUTANEOUS | Status: DC
  Filled 2023-09-04 (×2): qty 0.1

## 2023-09-04 MED ORDER — ALPRAZOLAM 0.5 MG PO TABS
1.0000 mg | ORAL_TABLET | Freq: Three times a day (TID) | ORAL | Status: DC | PRN
  Administered 2023-09-04 – 2023-09-05 (×3): 1 mg via ORAL
  Filled 2023-09-04 (×3): qty 2

## 2023-09-04 MED ORDER — PANTOPRAZOLE SODIUM 40 MG PO TBEC
40.0000 mg | DELAYED_RELEASE_TABLET | Freq: Two times a day (BID) | ORAL | Status: DC
  Filled 2023-09-04 (×2): qty 1

## 2023-09-04 MED ORDER — HYDROCODONE-ACETAMINOPHEN 10-325 MG PO TABS
1.0000 | ORAL_TABLET | ORAL | Status: DC | PRN
  Administered 2023-09-05 (×2): 2 via ORAL
  Filled 2023-09-04: qty 2
  Filled 2023-09-04: qty 1
  Filled 2023-09-04: qty 2

## 2023-09-04 NOTE — Assessment & Plan Note (Signed)
-  Update A1c -Sliding scale insulin and Semglee has been ordered -Will follow CBG fluctuation.

## 2023-09-04 NOTE — Assessment & Plan Note (Signed)
-  Continue PPI -Lifestyle modifications discussed with patient.

## 2023-09-04 NOTE — Assessment & Plan Note (Signed)
Continue CPAP nightly. °

## 2023-09-04 NOTE — Consult Note (Signed)
 Referring Provider: Dr. Gwenlyn Perking Primary Care Physician:  Porfirio Oar, PA Primary Gastroenterologist:  Dr. [name] Schlein  Reason for Consultation:  Rectal bleeding  HPI: Valerie [name] is a 63 y.o. female had acute onset of bright red blood per rectum yesterday that has occurred multiple times and last occurred at 8 PM last night.  Crampy lower abdominal pain since onset of the bleeding.  Denies any black stools.  Denies nausea vomiting.  Rare NSAID use.  Rare alcohol use.  Nuys blood thinners.  Colonoscopy in February 2023 where a tubular adenoma was removed and showed diverticulosis and external hemorrhoids.  EGD in 2024 with Botox injection to the pyloric channel.  Hemoglobin 13.1.  Past Medical History:  Diagnosis Date   Acute ischemic colitis (HCC)    Anemia 05/01/2012   Anxiety    ARF (acute renal failure) (HCC) 12/28/2015   june acute kidney injury and july minor kidney injury 2020 also kidneys recovering   Arthritis    "basically ~ q joint" (12/28/2015)   Asthma    Basal cell carcinoma of upper back ~ 2011   Chronic kidney disease (CKD) stage G2/A1, mildly decreased glomerular filtration rate (GFR) between 60-89 mL/min/1.73 square meter and albuminuria creatinine ratio less than 30 mg/g    sees [facility] q year   Chronic right-sided headaches    and neck pain, relieved wiht PT and chronic practic manipulation   Complete tear of right rotator cuff, recurrent 04/09/2018   DDD (degenerative disc disease), lumbar    Diabetes mellitus without complication (HCC)    Diarrhea    EPISODIC   Fatigue    h/o excessive sedation, etiology unknown   Fibromyalgia    L3 nerve damage and right arm   First degree heart block    GERD (gastroesophageal reflux disease) 04/06/2017   Headache(784.0)    "muscular w/migraine intensity; ~ 5-6 months" (12/28/2015)   History of hiatal hernia    History of ischemic colitis 09/24/2016   x 2   Hypertension    Mental disorder    reports PTSD    Microscopic polyangiitis (HCC)    Nerve pain 2011   nerve damage to right arm; normal EMG/NCV to RUExt after shoulder surgery 12/2009   Other bursal cyst, right shoulder 04/09/2018   Pancreatitis last time yrs ago   due to Effexor   Pneumonia ~ 2014; 08/2015   Aspiration   Polymyalgia rheumatica (HCC) onset 08/2015   changed diagnosis to MPA, auto immune disorder resolved   Precancerous melanosis (HCC)    "2nd toe left foot; 5th toe of right foot; right temple; had them all cut off"   Sleep apnea    CPAP 5 to 15    Past Surgical History:  Procedure Laterality Date   BASAL CELL CARCINOMA EXCISION  ~ 2011   back   BOTOX INJECTION N/A 11/06/2012   Procedure: BOTOX INJECTION;  Surgeon: Barrie Folk, MD;  Location: [facility] ENDOSCOPY;  Service: Endoscopy;  Laterality: N/A;   BOTOX INJECTION N/A 12/04/2013   Procedure: BOTOX INJECTION;  Surgeon: Barrie Folk, MD;  Location: [facility] ENDOSCOPY;  Service: Endoscopy;  Laterality: N/A;   BOTOX INJECTION N/A 02/26/2014   Procedure: BOTOX INJECTION;  Surgeon: Barrie Folk, MD;  Location: [facility] ENDOSCOPY;  Service: Endoscopy;  Laterality: N/A;   BOTOX INJECTION N/A 07/08/2015   Procedure: BOTOX INJECTION;  Surgeon: Dorena Cookey, MD;  Location: [facility] ENDOSCOPY;  Service: Endoscopy;  Laterality: N/A;   BOTOX INJECTION N/A 04/27/2016  Procedure: BOTOX INJECTION;  Surgeon: Dorena Cookey, MD;  Location: [facility] ENDOSCOPY;  Service: Endoscopy;  Laterality: N/A;   BOTOX INJECTION N/A 04/09/2017   Procedure: BOTOX INJECTION;  Surgeon: Vida Rigger, MD;  Location: [facility] ENDOSCOPY;  Service: Endoscopy;  Laterality: N/A;   BOTOX INJECTION  02/28/2022   Procedure: BOTOX INJECTION;  Surgeon: Vida Rigger, MD;  Location: [facility] ENDOSCOPY;  Service: Gastroenterology;;   BOTOX INJECTION  04/17/2023   Procedure: BOTOX INJECTION;  Surgeon: Vida Rigger, MD;  Location: [facility] ENDOSCOPY;  Service: Gastroenterology;;   CATARACT EXTRACTION Right    CATARACT EXTRACTION, BILATERAL      ESOPHAGOGASTRODUODENOSCOPY  12/21/2011   Procedure: ESOPHAGOGASTRODUODENOSCOPY (EGD);  Surgeon: Barrie Folk, MD;  Location: [facility] ENDOSCOPY;  Service: Endoscopy;  Laterality: N/A;   ESOPHAGOGASTRODUODENOSCOPY N/A 11/06/2012   Procedure: ESOPHAGOGASTRODUODENOSCOPY (EGD);  Surgeon: Barrie Folk, MD;  Location: [facility] ENDOSCOPY;  Service: Endoscopy;  Laterality: N/A;   ESOPHAGOGASTRODUODENOSCOPY N/A 12/04/2013   Procedure: ESOPHAGOGASTRODUODENOSCOPY (EGD);  Surgeon: Barrie Folk, MD;  Location: [facility] ENDOSCOPY;  Service: Endoscopy;  Laterality: N/A;   ESOPHAGOGASTRODUODENOSCOPY N/A 02/26/2014   Procedure: ESOPHAGOGASTRODUODENOSCOPY (EGD);  Surgeon: Barrie Folk, MD;  Location: [facility] ENDOSCOPY;  Service: Endoscopy;  Laterality: N/A;   ESOPHAGOGASTRODUODENOSCOPY N/A 04/09/2017   Procedure: ESOPHAGOGASTRODUODENOSCOPY (EGD);  Surgeon: Vida Rigger, MD;  Location: [facility] ENDOSCOPY;  Service: Endoscopy;  Laterality: N/A;   ESOPHAGOGASTRODUODENOSCOPY (EGD) WITH PROPOFOL N/A 07/08/2015   Procedure: ESOPHAGOGASTRODUODENOSCOPY (EGD) WITH PROPOFOL;  Surgeon: Dorena Cookey, MD;  Location: [facility] ENDOSCOPY;  Service: Endoscopy;  Laterality: N/A;   ESOPHAGOGASTRODUODENOSCOPY (EGD) WITH PROPOFOL N/A 04/27/2016   Procedure: ESOPHAGOGASTRODUODENOSCOPY (EGD) WITH PROPOFOL;  Surgeon: Dorena Cookey, MD;  Location: [facility] ENDOSCOPY;  Service: Endoscopy;  Laterality: N/A;   ESOPHAGOGASTRODUODENOSCOPY (EGD) WITH PROPOFOL N/A 03/06/2019   Procedure: ESOPHAGOGASTRODUODENOSCOPY (EGD) WITH PROPOFOL;  Surgeon: Vida Rigger, MD;  Location: [facility] ENDOSCOPY;  Service: Endoscopy;  Laterality: N/A;  with botox injection   ESOPHAGOGASTRODUODENOSCOPY (EGD) WITH PROPOFOL N/A 02/28/2022   Procedure: ESOPHAGOGASTRODUODENOSCOPY (EGD) WITH PROPOFOL;  Surgeon: Vida Rigger, MD;  Location: [facility] ENDOSCOPY;  Service: Gastroenterology;  Laterality: N/A;  with Botox injection   ESOPHAGOGASTRODUODENOSCOPY (EGD) WITH PROPOFOL N/A 04/17/2023   Procedure: ESOPHAGOGASTRODUODENOSCOPY (EGD)  WITH PROPOFOL;  Surgeon: Vida Rigger, MD;  Location: [facility] ENDOSCOPY;  Service: Gastroenterology;  Laterality: N/A;  with botox injections   EXCISION MASS UPPER EXTREMETIES Right 04/09/2018   Procedure: EXCISION RIGHT SHOULDER CYST;  Surgeon: Teryl Lucy, MD;  Location: [facility] ORS;  Service: Orthopedics;  Laterality: Right;  Interscalene Block   FLEXIBLE SIGMOIDOSCOPY  05/01/2012   Procedure: FLEXIBLE SIGMOIDOSCOPY;  Surgeon: Petra Kuba, MD;  Location: [facility] ENDOSCOPY;  Service: Endoscopy;  Laterality: N/A;  unprepped   FLEXIBLE SIGMOIDOSCOPY Left 09/28/2016   Procedure: FLEXIBLE SIGMOIDOSCOPY;  Surgeon: Charlott Rakes, MD;  Location: [facility] ENDOSCOPY;  Service: Endoscopy;  Laterality: Left;   LAPAROSCOPIC CHOLECYSTECTOMY  01/2002   NASAL SEPTOPLASTY W/ TURBINOPLASTY  1998   RETINAL TEAR REPAIR CRYOTHERAPY     SHOULDER ARTHROSCOPY WITH ROTATOR CUFF REPAIR Right 04/09/2018   Procedure: RIGHT SHOULDER ARTHROSCOPY WITH EXTENSIVE DEBRIDEMENT;  Surgeon: Teryl Lucy, MD;  Location: [facility] OR[facility];  Service: Orthopedics;  Laterality: Right;  Interscalene Block   SHOULDER ARTHROSCOPY WITH ROTATOR CUFF REPAIR AND OPEN BICEPS TENODESIS Bilateral 2010-2013   left-right   SUBMUCOSAL INJECTION  03/06/2019   Procedure: SUBMUCOSAL INJECTION;  Surgeon: Vida Rigger, MD;  Location: [facility] ENDOSCOPY;  Service: Endoscopy;;    Prior to Admission medications   Medication Sig Start Date End Date Taking? Authorizing  Provider  albuterol (PROAIR HFA) 108 (90 Base) MCG/ACT inhaler Inhale 2 puffs into the lungs every 4 (four) hours as needed for wheezing or shortness of breath. 04/24/18  Yes Sherren Mocha, MD  allopurinol (ZYLOPRIM) 300 MG tablet TAKE 1/2 TABLET BY MOUTH ONCE A DAY Patient taking differently: Take 300 mg by mouth daily. 08/08/18  Yes Sherren Mocha, MD  ALPRAZolam Prudy Feeler) 1 MG tablet Take 1 tablet (1 mg total) by mouth 3 (three) times daily as needed. for anxiety 08/08/18  Yes Sherren Mocha, MD  baclofen (LIORESAL) 10 MG tablet  Take 10 mg by mouth 3 (three) times daily as needed for muscle spasms.    Yes [provider]  BD PEN NEEDLE NANO 2ND GEN 32G X 4 MM MISC  08/09/23  Yes [provider]  benazepril (LOTENSIN) 40 MG tablet Take 1 tablet (40 mg total) by mouth daily. 08/08/18  Yes Sherren Mocha, MD  CARAFATE 1 GM/10ML suspension TAKE 10 ML'S BY MOUTH FOUR TIMES A DAY AS NEEDED FOR STOMACH PAIN Patient taking differently: Take 1 g by mouth 3 (three) times daily as needed (burning in esophagus). 05/06/18  Yes Sherren Mocha, MD  carboxymethylcellulose (REFRESH PLUS) 0.5 % SOLN Place 1 drop into both eyes 3 (three) times daily as needed (dry eyes).   Yes [provider]  cetirizine (ZYRTEC) 10 MG tablet Take 10 mg by mouth daily.    Yes [provider]  Cholecalciferol (VITAMIN D3) 5000 units CAPS Take 5,000 Units by mouth daily.    Yes [provider]  dexlansoprazole (DEXILANT) 60 MG capsule Take 60 mg by mouth daily.    Yes [provider]  fexofenadine (ALLEGRA) 180 MG tablet Take 180 mg by mouth daily.   Yes [provider]  fluticasone (FLONASE) 50 MCG/ACT nasal spray PLACE 2 SPRAYS INTO BOTH NOSTRILS DAILY 11/28/17  Yes Sherren Mocha, MD  furosemide (LASIX) 20 MG tablet Take 20 mg by mouth daily as needed (fluid retention.).   Yes [provider]  HYDROcodone-acetaminophen (NORCO) 10-325 MG per tablet Take 1-2 tablets by mouth every 6 (six) hours as needed for moderate pain (pain.).    Yes [provider]  hydrOXYzine (ATARAX/VISTARIL) 10 MG tablet Take 1-3 tablets (10-30 mg total) by mouth 3 (three) times daily as needed for anxiety. 08/08/18  Yes Sherren Mocha, MD  ibuprofen (ADVIL,MOTRIN) 600 MG tablet Take 600 mg by mouth daily as needed (migraines).   Yes [provider]  Insulin Glargine (BASAGLAR KWIKPEN) 100 UNIT/ML Inject 40 Units into the skin every evening. 03/09/20  Yes [provider]  insulin regular (NOVOLIN R) 100  units/mL injection as directed. CHECK GLUCOSE EVERY 4 HOURS. IF GLUCOSE  <150 GIVE NO INSULIN; IF 150-199 GIVE 2 UNITS [place]; IF 200-249 GIVE 4 UNITS; IF 250-299 GIVE 6 UNITS; IF 300-349 GIVE 8 UNITS; IF 350-399 GIVE 10 UNITS; IF 400 AND ABOVE GIVE 12 UNITS 02/25/20  Yes [provider]  Insulin Syringe-Needle U-100 30G X 1/2" 1 ML MISC USE TO GIVE INSULIN FOUR TIMES A DAY WITH SLIDING SCALE 08/24/23  Yes [provider]  lidocaine-prilocaine (EMLA) cream Apply 1 Application topically once. 05/22/23  Yes [provider]  Melatonin 5 MG TABS Take 10 mg by mouth at bedtime as needed (sleep).   Yes [provider]  metoprolol succinate (TOPROL-XL) 100 MG 24 hr tablet Take 1 tablet (100 mg total) by mouth daily. Take with or immediately following  a meal. 08/08/18  Yes Sherren Mocha, MD  modafinil (PROVIGIL) 200 MG tablet Take 0.5 tablets (100 mg total) by mouth 2 (two) times daily. 04/03/23  Yes Dohmeier, Porfirio Mylar, MD  montelukast (SINGULAIR) 10 MG tablet TAKE ONE TABLET BY MOUTH EVERY NIGHT AT BEDTIME Patient taking differently: Take 10 mg by mouth daily. 06/06/18  Yes Sherren Mocha, MD  Multiple Vitamins-Minerals (MULTIVITAMIN PO) Take 1 tablet by mouth daily.   Yes [provider]  Olopatadine HCl (PAZEO) 0.7 % SOLN Apply 1 drop to eye daily as needed (allergies). 08/08/18  Yes Sherren Mocha, MD  ondansetron (ZOFRAN) 4 MG tablet Take 1 tablet (4 mg total) by mouth every 8 (eight) hours as needed for nausea or vomiting. 11/18/16  Yes Ofilia Neas, PA-C  promethazine (PHENERGAN) 25 MG tablet Take 1 tablet (25 mg total) by mouth every 6 (six) hours as needed for nausea or vomiting. 08/08/18  Yes Sherren Mocha, MD  torsemide (DEMADEX) 10 MG tablet Take 10 mg by mouth daily as needed (fluid). 03/18/21  Yes [provider]  triamcinolone cream (KENALOG) 0.1 % Apply 1 Application topically as needed (Skin irritation). 06/12/23  Yes [provider]  Pancrelipase,  Lip-Prot-Amyl, 24000-76000 units CPEP Take 24,000-72,000 Units by mouth 3 (three) times daily as needed (for pancreatitis).     [provider]    Scheduled Meds:  allopurinol  300 mg Oral Daily   cholecalciferol  5,000 Units Oral Daily   fluticasone  2 spray Each Nare Daily   insulin aspart  0-15 Units Subcutaneous TID WC   insulin aspart  0-5 Units Subcutaneous QHS   insulin glargine-yfgn  10 Units Subcutaneous QHS   loratadine  10 mg Oral Daily   metoprolol succinate  100 mg Oral Daily   modafinil  100 mg Oral BID   montelukast  10 mg Oral Daily   pantoprazole  40 mg Oral BID   Continuous Infusions:  lactated ringers 75 mL/hr at 09/04/23 0312   PRN Meds:.acetaminophen **OR** acetaminophen, ALPRAZolam, baclofen, HYDROcodone-acetaminophen, ondansetron **OR** ondansetron (ZOFRAN) IV, polyvinyl alcohol  Allergies as of 09/03/2023 - Review Complete 09/03/2023  Allergen Reaction Noted   Dilaudid [hydromorphone hcl] Other (See Comments) 08/01/2011   Effexor [venlafaxine]  03/09/2017   Pregabalin Other (See Comments) 08/01/2011   Jardiance [empagliflozin] Other (See Comments) 12/29/2019   Adhesive [tape]  04/04/2018   Metoclopramide hcl Other (See Comments) 08/01/2011   Neurontin [gabapentin] Other (See Comments) 08/01/2011   Serotonin reuptake inhibitors (ssris) Other (See Comments) 11/18/2016   Metformin and related Diarrhea 11/05/2019    Family History  Problem Relation Age of Onset   Heart disease Mother    COPD Mother    Arthritis Mother    Cancer Mother    Hypertension Mother    Arthritis Paternal Grandmother    Heart disease Paternal Grandmother    Heart disease Paternal Grandfather     Social History   Socioeconomic History   Marital status: Single    Spouse name: Not on file   Number of children: 0   Years of education: College   Highest education level: Not on file  Occupational History   Occupation: disabled  Tobacco Use   Smoking status: Never    Smokeless tobacco: Never  Vaping Use   Vaping status: Never Used  Substance and Sexual Activity   Alcohol use: No   Drug use: No   Sexual activity: Not Currently  Other Topics Concern   Not on  file  Social History Narrative   Caffeine 2 cups iced tea daily avg.   Lives alone   Right handed   Social Drivers of Health   Financial Resource Strain: Low Risk  (06/28/2023)   Received from [facility]   Overall Financial Resource Strain (CARDIA)    Difficulty of Paying Living Expenses: Not hard at all  Food Insecurity: No Food Insecurity (06/28/2023)   Received from [facility]   Hunger Vital Sign    Worried About Running Out of Food in the Last Year: Never true    Ran Out of Food in the Last Year: Never true  Transportation Needs: No Transportation Needs (06/28/2023)   Received from [facility] - Transportation    Lack of Transportation (Medical): No    Lack of Transportation (Non-Medical): No  Physical Activity: Insufficiently Active (05/19/2023)   Received from [facility]   Exercise Vital Sign    Days of Exercise per Week: 2 days    Minutes of Exercise per Session: 20 min  Stress: No Stress Concern Present (05/21/2023)   Received from [facility] - Occupational Stress Questionnaire    Feeling of Stress : Only a little  Social Connections: Patient Declined (05/19/2023)   Received from [facility]   Social Network    How would you rate your social network (family, work, friends)?: Patient declined  Intimate Partner Violence: Not At Risk (05/21/2023)   Received from [facility]   HITS    Over the last 12 months how often did your partner physically hurt you?: Never    Over the last 12 months how often did your partner insult you or talk down to you?: Never    Over the last 12 months how often did your partner threaten you with physical harm?: Never    Over the last 12 months how often did your partner scream or  curse at you?: Never    Review of Systems: All negative except as stated above in HPI.  Physical Exam: Vital signs: Vitals:   09/04/23 0905 09/04/23 0945  BP:  (!) 125/59  Pulse:  82  Resp:  18  Temp: 98 F (36.7 C)   SpO2:  98%   Last BM Date : 09/04/23 General:   Lethargic, obese, no acute distress, pleasant  Head: normocephalic, atraumatic Eyes: anicteric sclera ENT: oropharynx clear Neck: supple, nontender Lungs:  Clear throughout to auscultation.   No wheezes, crackles, or rhonchi. No acute distress. Heart:  Regular rate and rhythm; no murmurs, clicks, rubs,  or gallops. Abdomen: Soft nontender, nondistended, positive bowel sounds Rectal:  Deferred Ext: no edema  GI:  Lab Results: Recent Labs    09/03/23 2131 09/04/23 0307 09/04/23 0728  WBC 10.5 11.0* 9.8  HGB 13.4 12.9 13.1  HCT 41.6 39.5 40.4  PLT 310 268 285   BMET Recent Labs    09/03/23 2131  NA 137  K 3.6  CL 105  CO2 23  GLUCOSE 116*  BUN 22  CREATININE 1.11*  CALCIUM 9.7   LFT Recent Labs    09/03/23 2131  PROT 7.8  ALBUMIN 4.2  AST 19  ALT 18  ALKPHOS 50  BILITOT 0.5   PT/INR No results for input(s): "LABPROT", "INR" in the last 72 hours.   Studies/Results: CT ABDOMEN PELVIS W CONTRAST Result Date: 09/04/2023 CLINICAL DATA:  Polytrauma, blunt Rectal bleeding, multiple episodes of seeing bright red blood in her stool 09[birth year]02[birth year],  wbc's 10.5, GFR 57, negative stool card, hx of HTN, GERD, ischemic colitis, acute renal failure, pancreatitis, and basal cell ca 2001 EXAM: CT ABDOMEN AND PELVIS WITH CONTRAST TECHNIQUE: Multidetector CT imaging of the abdomen and pelvis was performed using the standard protocol following bolus administration of intravenous contrast. RADIATION DOSE REDUCTION: This exam was performed according to the departmental dose-optimization program which includes automated exposure control, adjustment of the mA and/or kV according to patient size and/or use of iterative  reconstruction technique. CONTRAST:  OMNIPAQUE IOHEXOL 300 MG/ML  SOLN COMPARISON:  CT renal 11/20/2018 FINDINGS: Lower chest: Tiny hiatal hernia. Hepatobiliary: Liver is enlarged measuring up to 21 cm. No focal liver abnormality. Status post cholecystectomy. No biliary dilatation. Pancreas: No focal lesion. Normal pancreatic contour. No surrounding inflammatory changes. No main pancreatic ductal dilatation. Spleen: Normal in size without focal abnormality.  Splenule noted. Adrenals/Urinary Tract: No adrenal nodule bilaterally. Bilateral kidneys enhance symmetrically. No hydronephrosis. No hydroureter. The urinary bladder is unremarkable. Stomach/Bowel: Stomach is within normal limits. No evidence of bowel wall thickening or dilatation. Colonic diverticulosis. Appendix appears normal. Vascular/Lymphatic: No abdominal aorta or iliac aneurysm. no abdominal, pelvic, or inguinal lymphadenopathy. Reproductive: Uterus and bilateral adnexa are unremarkable. Other: No intraperitoneal free fluid. No intraperitoneal free gas. No organized fluid collection. Musculoskeletal: No abdominal wall hernia or abnormality. No suspicious lytic or blastic osseous lesions. No acute displaced fracture. IMPRESSION: 1. Colonic diverticulosis with no acute diverticulitis. 2. Tiny hiatal hernia. 3. Hepatomegaly. 4. Status post cholecystectomy. 5. Limited evaluation on this noncontrast study. Electronically Signed   By: Tish Frederickson M.D.   On: 09/04/2023 02:03    Impression/Plan: Bright red blood per rectum likely diverticular in origin- doubt ischemia.  Hemoglobin normal.  Supportive care.  I do not think a repeat colonoscopy is needed at this time.  Clear liquid diet.  If stable discharge tomorrow.  Will follow.    LOS: 0 days   Shirley Friar  09/04/2023, 9:54 AM  Questions please call [phone]

## 2023-09-04 NOTE — H&P (Signed)
 History and Physical    Patient: Valerie [name] ZOX:[id number] DOB: 1961/04/25 DOA: 09/03/2023 DOS: the patient was seen and examined on 09/04/2023 PCP: Porfirio Oar, PA   Patient coming from: Home  Chief Complaint:  Chief Complaint  Patient presents with   Rectal Bleeding   HPI: Valerie [name] is a 63 y.o. female with medical history significant of morbid obesity, hypertension, gastroesophageal reflux disease, chronic kidney disease stage II, asthma, anxiety, obstructive sleep apnea, fibromyalgia/chronic pain syndrome, IBS and type 2 diabetes with nephropathy; who presented to the hospital with concerns of abdominal pain and rectal bleeding.  Patient reports sudden presentation of symptoms approximately 24 hours prior to admission; she expressed experiencing sudden intermittent abdominal discomfort and was planning to follow-up with her gastroenterologist as an outpatient; unfortunately symptoms worsen and she ended noticing bright red blood per rectum and around 8 PM the night prior to admission.  Patient reported associated crampy lower abdominal pain and also intermittent midepigastric discomfort.  She is present having some nausea but no vomiting.  Patient with prior history of ischemic colitis on previous hospitalization (x 2) continues presentation is completely different according to patient reports.  She has present no chest pain, no shortness of breath, no vomiting, no dysuria, no hematuria, no focal weaknesses or any other complaints.  Patient had another episode of bloody stools while in the ED. fluid resuscitation has been initiated; CT scan demonstrating no acute intra-abdominal abnormality, stable hemoglobin and also stable hemodynamically vital signs.  [facility] and gastroenterology service has been consulted.   Review of Systems: As mentioned in the history of present illness. All other systems reviewed and are negative. Past Medical History:  Diagnosis Date   Acute ischemic  colitis (HCC)    Anemia 05/01/2012   Anxiety    ARF (acute renal failure) (HCC) 12/28/2015   june acute kidney injury and july minor kidney injury 2020 also kidneys recovering   Arthritis    "basically ~ q joint" (12/28/2015)   Asthma    Basal cell carcinoma of upper back ~ 2011   Chronic kidney disease (CKD) stage G2/A1, mildly decreased glomerular filtration rate (GFR) between 60-89 mL/min/1.73 square meter and albuminuria creatinine ratio less than 30 mg/g    sees [facility] q year   Chronic right-sided headaches    and neck pain, relieved wiht PT and chronic practic manipulation   Complete tear of right rotator cuff, recurrent 04/09/2018   DDD (degenerative disc disease), lumbar    Diabetes mellitus without complication (HCC)    Diarrhea    EPISODIC   Fatigue    h/o excessive sedation, etiology unknown   Fibromyalgia    L3 nerve damage and right arm   First degree heart block    GERD (gastroesophageal reflux disease) 04/06/2017   Headache(784.0)    "muscular w/migraine intensity; ~ 5-6 months" (12/28/2015)   History of hiatal hernia    History of ischemic colitis 09/24/2016   x 2   Hypertension    Mental disorder    reports PTSD   Microscopic polyangiitis (HCC)    Nerve pain 2011   nerve damage to right arm; normal EMG/NCV to RUExt after shoulder surgery 12/2009   Other bursal cyst, right shoulder 04/09/2018   Pancreatitis last time yrs ago   due to Effexor   Pneumonia ~ 2014; 08/2015   Aspiration   Polymyalgia rheumatica (HCC) onset 08/2015   changed diagnosis to MPA, auto immune disorder resolved   Precancerous melanosis (HCC)    "  2nd toe left foot; 5th toe of right foot; right temple; had them all cut off"   Sleep apnea    CPAP 5 to 15   Past Surgical History:  Procedure Laterality Date   BASAL CELL CARCINOMA EXCISION  ~ 2011   back   BOTOX INJECTION N/A 11/06/2012   Procedure: BOTOX INJECTION;  Surgeon: Barrie Folk, MD;  Location: [facility] ENDOSCOPY;  Service:  Endoscopy;  Laterality: N/A;   BOTOX INJECTION N/A 12/04/2013   Procedure: BOTOX INJECTION;  Surgeon: Barrie Folk, MD;  Location: [facility] ENDOSCOPY;  Service: Endoscopy;  Laterality: N/A;   BOTOX INJECTION N/A 02/26/2014   Procedure: BOTOX INJECTION;  Surgeon: Barrie Folk, MD;  Location: [facility] ENDOSCOPY;  Service: Endoscopy;  Laterality: N/A;   BOTOX INJECTION N/A 07/08/2015   Procedure: BOTOX INJECTION;  Surgeon: Dorena Cookey, MD;  Location: [facility] ENDOSCOPY;  Service: Endoscopy;  Laterality: N/A;   BOTOX INJECTION N/A 04/27/2016   Procedure: BOTOX INJECTION;  Surgeon: Dorena Cookey, MD;  Location: [facility] ENDOSCOPY;  Service: Endoscopy;  Laterality: N/A;   BOTOX INJECTION N/A 04/09/2017   Procedure: BOTOX INJECTION;  Surgeon: Vida Rigger, MD;  Location[facility] ENDOSCOPY;  Service: Endoscopy;  Laterality: N/A;   BOTOX INJECTION  02/28/2022   Procedure: BOTOX INJECTION;  Surgeon: Vida Rigger, MD;  Location: [facility] ENDOSCOPY;  Service: Gastroenterology;;   BOTOX INJECTION  04/17/2023   Procedure: BOTOX INJECTION;  Surgeon: Vida Rigger, MD;  Location: [facility] ENDOSCOPY;  Service: Gastroenterology;;   CATARACT EXTRACTION Right    CATARACT EXTRACTION, BILATERAL     ESOPHAGOGASTRODUODENOSCOPY  12/21/2011   Procedure: ESOPHAGOGASTRODUODENOSCOPY (EGD);  Surgeon: Barrie Folk, MD;  Location[facility] ENDOSCOPY;  Service: Endoscopy;  Laterality: N/A;   ESOPHAGOGASTRODUODENOSCOPY N/A 11/06/2012   Procedure: ESOPHAGOGASTRODUODENOSCOPY (EGD);  Surgeon: Barrie Folk, MD;  Location: [facility] ENDOSCOPY;  Service: Endoscopy;  Laterality: N/A;   ESOPHAGOGASTRODUODENOSCOPY N/A 12/04/2013   Procedure: ESOPHAGOGASTRODUODENOSCOPY (EGD);  Surgeon: Barrie Folk, MD;  Location: [facility] ENDOSCOPY;  Service: Endoscopy;  Laterality: N/A;   ESOPHAGOGASTRODUODENOSCOPY N/A 02/26/2014   Procedure: ESOPHAGOGASTRODUODENOSCOPY (EGD);  Surgeon: Barrie Folk, MD;  Location: [facility] ENDOSCOPY;  Service: Endoscopy;  Laterality: N/A;   ESOPHAGOGASTRODUODENOSCOPY N/A 04/09/2017    Procedure: ESOPHAGOGASTRODUODENOSCOPY (EGD);  Surgeon: Vida Rigger, MD;  Location: [facility] ENDOSCOPY;  Service: Endoscopy;  Laterality: N/A;   ESOPHAGOGASTRODUODENOSCOPY (EGD) WITH PROPOFOL N/A 07/08/2015   Procedure: ESOPHAGOGASTRODUODENOSCOPY (EGD) WITH PROPOFOL;  Surgeon: Dorena Cookey, MD;  Location: [facility] ENDOSCOPY;  Service: Endoscopy;  Laterality: N/A;   ESOPHAGOGASTRODUODENOSCOPY (EGD) WITH PROPOFOL N/A 04/27/2016   Procedure: ESOPHAGOGASTRODUODENOSCOPY (EGD) WITH PROPOFOL;  Surgeon: Dorena Cookey, MD;  Location: [facility] ENDOSCOPY;  Service: Endoscopy;  Laterality: N/A;   ESOPHAGOGASTRODUODENOSCOPY (EGD) WITH PROPOFOL N/A 03/06/2019   Procedure: ESOPHAGOGASTRODUODENOSCOPY (EGD) WITH PROPOFOL;  Surgeon: Vida Rigger, MD;  Location: [facility] ENDOSCOPY;  Service: Endoscopy;  Laterality: N/A;  with botox injection   ESOPHAGOGASTRODUODENOSCOPY (EGD) WITH PROPOFOL N/A 02/28/2022   Procedure: ESOPHAGOGASTRODUODENOSCOPY (EGD) WITH PROPOFOL;  Surgeon: Vida Rigger, MD;  Location: [facility] ENDOSCOPY;  Service: Gastroenterology;  Laterality: N/A;  with Botox injection   ESOPHAGOGASTRODUODENOSCOPY (EGD) WITH PROPOFOL N/A 04/17/2023   Procedure: ESOPHAGOGASTRODUODENOSCOPY (EGD) WITH PROPOFOL;  Surgeon: Vida Rigger, MD;  Location: [facility] ENDOSCOPY;  Service: Gastroenterology;  Laterality: N/A;  with botox injections   EXCISION MASS UPPER EXTREMETIES Right 04/09/2018   Procedure: EXCISION RIGHT SHOULDER CYST;  Surgeon: Teryl Lucy, MD;  Location: [facility] ORS;  Service: Orthopedics;  Laterality: Right;  Interscalene Block   FLEXIBLE SIGMOIDOSCOPY  05/01/2012  Procedure: FLEXIBLE SIGMOIDOSCOPY;  Surgeon: Petra Kuba, MD;  Location: [facility] ENDOSCOPY;  Service: Endoscopy;  Laterality: N/A;  unprepped   FLEXIBLE SIGMOIDOSCOPY Left 09/28/2016   Procedure: FLEXIBLE SIGMOIDOSCOPY;  Surgeon: Charlott Rakes, MD;  Location: [facility] ENDOSCOPY;  Service: Endoscopy;  Laterality: Left;   LAPAROSCOPIC CHOLECYSTECTOMY  01/2002   NASAL SEPTOPLASTY W/ TURBINOPLASTY   1998   RETINAL TEAR REPAIR CRYOTHERAPY     SHOULDER ARTHROSCOPY WITH ROTATOR CUFF REPAIR Right 04/09/2018   Procedure: RIGHT SHOULDER ARTHROSCOPY WITH EXTENSIVE DEBRIDEMENT;  Surgeon: Teryl Lucy, MD;  Location: [facility] ORS;  Service: Orthopedics;  Laterality: Right;  Interscalene Block   SHOULDER ARTHROSCOPY WITH ROTATOR CUFF REPAIR AND OPEN BICEPS TENODESIS Bilateral 2010-2013   left-right   SUBMUCOSAL INJECTION  03/06/2019   Procedure: SUBMUCOSAL INJECTION;  Surgeon: Vida Rigger, MD;  Location: [facility] ENDOSCOPY;  Service: Endoscopy;;   Social History:  reports that she has never smoked. She has never used smokeless tobacco. She reports that she does not drink alcohol and does not use drugs.  Allergies  Allergen Reactions   Dilaudid [Hydromorphone Hcl] Other (See Comments)    Headache    Effexor [Venlafaxine]     Pancreatitis, bladder & bowel incontinence   Pregabalin Other (See Comments)    Drug induced stupor   Jardiance [Empagliflozin] Other (See Comments)    "Pancreatic pain."    Adhesive [Tape]     Paper tape causes blood red marks   Metoclopramide Hcl Other (See Comments)    "crawling inside"   Neurontin [Gabapentin] Other (See Comments)    Uncontrollable twitches and movements   Serotonin Reuptake Inhibitors (Ssris) Other (See Comments)    Damage to pancreas and bowel/urinary incontinence    Metformin And Related Diarrhea    Severe gastropresis     Family History  Problem Relation Age of Onset   Heart disease Mother    COPD Mother    Arthritis Mother    Cancer Mother    Hypertension Mother    Arthritis Paternal Grandmother    Heart disease Paternal Grandmother    Heart disease Paternal Grandfather     Prior to Admission medications   Medication Sig Start Date End Date Taking? Authorizing Provider  albuterol (PROAIR HFA) 108 (90 Base) MCG/ACT inhaler Inhale 2 puffs into the lungs every 4 (four) hours as needed for wheezing or shortness of breath. 04/24/18  Yes Sherren Mocha, MD  allopurinol (ZYLOPRIM) 300 MG tablet TAKE 1/2 TABLET BY MOUTH ONCE A DAY Patient taking differently: Take 300 mg by mouth daily. 08/08/18  Yes Sherren Mocha, MD  ALPRAZolam Prudy Feeler) 1 MG tablet Take 1 tablet (1 mg total) by mouth 3 (three) times daily as needed. for anxiety 08/08/18  Yes Sherren Mocha, MD  baclofen (LIORESAL) 10 MG tablet Take 10 mg by mouth 3 (three) times daily as needed for muscle spasms.    Yes [provider]  BD PEN NEEDLE NANO 2ND GEN 32G X 4 MM MISC  08/09/23  Yes [provider]  benazepril (LOTENSIN) 40 MG tablet Take 1 tablet (40 mg total) by mouth daily. 08/08/18  Yes Sherren Mocha, MD  CARAFATE 1 GM/10ML suspension TAKE 10 ML'S BY MOUTH FOUR TIMES A DAY AS NEEDED FOR STOMACH PAIN Patient taking differently: Take 1 g by mouth 3 (three) times daily as needed (burning in esophagus). 05/06/18  Yes Sherren Mocha, MD  carboxymethylcellulose (REFRESH PLUS) 0.5 % SOLN Place 1 drop into both eyes 3 (three) times  daily as needed (dry eyes).   Yes [provider]  cetirizine (ZYRTEC) 10 MG tablet Take 10 mg by mouth daily.    Yes [provider]  Cholecalciferol (VITAMIN D3) 5000 units CAPS Take 5,000 Units by mouth daily.    Yes [provider]  dexlansoprazole (DEXILANT) 60 MG capsule Take 60 mg by mouth daily.    Yes [provider]  fexofenadine (ALLEGRA) 180 MG tablet Take 180 mg by mouth daily.   Yes [provider]  fluticasone (FLONASE) 50 MCG/ACT nasal spray PLACE 2 SPRAYS INTO BOTH NOSTRILS DAILY 11/28/17  Yes Sherren Mocha, MD  furosemide (LASIX) 20 MG tablet Take 20 mg by mouth daily as needed (fluid retention.).   Yes [provider]  HYDROcodone-acetaminophen (NORCO) 10-325 MG per tablet Take 1-2 tablets by mouth every 6 (six) hours as needed for moderate pain (pain.).    Yes [provider]  hydrOXYzine (ATARAX/VISTARIL) 10 MG tablet Take 1-3 tablets (10-30 mg total) by mouth 3 (three) times  daily as needed for anxiety. 08/08/18  Yes Sherren Mocha, MD  ibuprofen (ADVIL,MOTRIN) 600 MG tablet Take 600 mg by mouth daily as needed (migraines).   Yes [provider]  Insulin Glargine (BASAGLAR KWIKPEN) 100 UNIT/ML Inject 40 Units into the skin every evening. 03/09/20  Yes [provider]  insulin regular (NOVOLIN R) 100 units/mL injection as directed. CHECK GLUCOSE EVERY 4 HOURS. IF GLUCOSE  <150 GIVE NO INSULIN; IF 150-199 GIVE 2 UNITS [place]; IF 200-249 GIVE 4 UNITS; IF 250-299 GIVE 6 UNITS; IF 300-349 GIVE 8 UNITS; IF 350-399 GIVE 10 UNITS; IF 400 AND ABOVE GIVE 12 UNITS 02/25/20  Yes [provider]  Insulin Syringe-Needle U-100 30G X 1/2" 1 ML MISC USE TO GIVE INSULIN FOUR TIMES A DAY WITH SLIDING SCALE 08/24/23  Yes [provider]  lidocaine-prilocaine (EMLA) cream Apply 1 Application topically once. 05/22/23  Yes [provider]  Melatonin 5 MG TABS Take 10 mg by mouth at bedtime as needed (sleep).   Yes [provider]  metoprolol succinate (TOPROL-XL) 100 MG 24 hr tablet Take 1 tablet (100 mg total) by mouth daily. Take with or immediately following a meal. 08/08/18  Yes Sherren Mocha, MD  modafinil (PROVIGIL) 200 MG tablet Take 0.5 tablets (100 mg total) by mouth 2 (two) times daily. 04/03/23  Yes Dohmeier, Porfirio Mylar, MD  montelukast (SINGULAIR) 10 MG tablet TAKE ONE TABLET BY MOUTH EVERY NIGHT AT BEDTIME Patient taking differently: Take 10 mg by mouth daily. 06/06/18  Yes Sherren Mocha, MD  Multiple Vitamins-Minerals (MULTIVITAMIN PO) Take 1 tablet by mouth daily.   Yes [provider]  Olopatadine HCl (PAZEO) 0.7 % SOLN Apply 1 drop to eye daily as needed (allergies). 08/08/18  Yes Sherren Mocha, MD  ondansetron (ZOFRAN) 4 MG tablet Take 1 tablet (4 mg total) by mouth every 8 (eight) hours as needed for nausea or vomiting. 11/18/16  Yes Ofilia Neas, PA-C  promethazine (PHENERGAN) 25 MG tablet Take 1 tablet (25 mg total) by mouth every 6  (six) hours as needed for nausea or vomiting. 08/08/18  Yes Sherren Mocha, MD  torsemide (DEMADEX) 10 MG tablet Take 10 mg by mouth daily as needed (fluid). 03/18/21  Yes [provider]  triamcinolone cream (KENALOG) 0.1 % Apply 1 Application topically as needed (Skin irritation). 06/12/23  Yes [provider]  Pancrelipase, Lip-Prot-Amyl, 24000-76000 units CPEP Take 24,000-72,000 Units by mouth 3 (three) times  daily as needed (for pancreatitis).     [provider]    Physical Exam: Vitals:   09/04/23 0500 09/04/23 0730 09/04/23 0800 09/04/23 0830  BP: 131/75 (!) 155/78 135/76 123/68  Pulse: 90 87 79 73  Resp: 17 18 18 18   Temp: 98 F (36.7 C)     TempSrc:      SpO2: 96% 98% 98% 94%  Weight:      Height:       General exam: Alert, awake, oriented x 3; no chest pain, no shortness of breath.  Afebrile Respiratory system: Clear to auscultation. Respiratory effort normal.  Good saturation on room air. Cardiovascular system:RRR. No murmurs, rubs, gallops.  Unable to properly assess JVD with body habitus. Gastrointestinal system: Abdomen is obese, nondistended, soft and vaguely tender to palpation in midepigastric area and lower abdomen.  No guarding.  Positive bowel sounds. Central nervous system:No focal neurological deficits. Extremities: No signsof clubbing; chronic swelling with body habitus appreciated and unchanged from baseline. Skin: No rashes, no petechiae. Psychiatry: Judgement and insight appear normal. Mood & affect appropriate.   Data Reviewed: Comprehensive metabolic panel: Sodium 137, potassium 3.6, chloride 105, bicarb 23, BUN 22, creatinine 1.11, normal LFTs and GFR 57 CBC: White blood cells 10.5, hemoglobin 13.4 and platelet count 310K Fecal occult blood test: Negative Lactic acid: 0.8  Assessment and Plan: Rectal bleeding -Presentation and a stable hemoglobin suggesting diverticular bleed -Follow hemoglobin trend -Allow for clear liquid  diet -GI service has been consulted and will follow any recommendation -At this moment doubt the need for any in-house endoscopic evaluation.  Hypertension -Stable for the most part -Will continue Toprol -Holding benazepril and diuretics in the setting of acute GI bleed -Follow vital signs -Heart healthy/low-sodium diet discussed with patient.  IBS (irritable bowel syndrome) -Continue supportive care, adequate hydration and as needed analgesics -Gastroenterology service has been consulted and will follow any further recommendations.  Fibromyalgia -Continue as needed analgesics and muscle relaxant.  Morbid obesity with body mass index (BMI) greater than or equal to 50 (HCC) -Body mass index is 51.75 kg/m. -Low-calorie diet, portion control and increase physical activity discussed with patient.  OSA on CPAP -Continue CPAP nightly  GERD (gastroesophageal reflux disease) -Continue PPI. -Lifestyle modifications discussed with patient.  Excessive daytime sleepiness -Continue the use of Provigil as previously prescribed -Good compliance with CPAP and weight loss management discussed with patient.  Type 2 diabetes mellitus with chronic painful diabetic neuropathy (HCC) -Update A1c -Sliding scale insulin and Semglee has been ordered -Will follow CBG fluctuation.   Anxiety -Resume home anxiolytic therapy -Provide supportive care.    Advance Care Planning:   Code Status: Full Code   Consults: Gastroenterology service [facility])  Family Communication: No family at bedside  Severity of Illness: The appropriate patient status for this patient is OBSERVATION. Observation status is judged to be reasonable and necessary in order to provide the required intensity of service to ensure the patient's safety. The patient's presenting symptoms, physical exam findings, and initial radiographic and laboratory data in the context of their medical condition is felt to place them at decreased  risk for further clinical deterioration. Furthermore, it is anticipated that the patient will be medically stable for discharge from the hospital within 2 midnights of admission.   Author: Vassie Loll, MD 09/04/2023 8:46 AM  For on call review [email or web address].

## 2023-09-04 NOTE — Assessment & Plan Note (Signed)
-  Continue supportive care, adequate hydration and as needed analgesics -Gastroenterology service has been consulted and will follow any further recommendations.

## 2023-09-04 NOTE — Assessment & Plan Note (Signed)
-  Continue the use of Provigil as previously prescribed -Good compliance with CPAP and weight loss management discussed with patient.

## 2023-09-04 NOTE — Progress Notes (Signed)
   09/04/23 2200  BiPAP/CPAP/SIPAP  BiPAP/CPAP/SIPAP Pt Type Adult (Patient prefers self setup and placement when ready for bed.  I provided the patient with sterile water for her humidifier chamber.)  BiPAP/CPAP/SIPAP Resmed (Patient home equipment)  Dentures removed? Not applicable  FiO2 (%) 21 %  Heater Temperature  (sterile water provided for her humidification chamber)  Patient Home Machine Yes

## 2023-09-04 NOTE — Assessment & Plan Note (Signed)
-  Stable for the most part -Will continue Toprol -Holding benazepril and diuretics in the setting of acute GI bleed -Follow vital signs -Heart healthy/low-sodium diet discussed with patient.

## 2023-09-04 NOTE — ED Provider Notes (Signed)
 [place] [facility]ERGENCY DEPARTMENT AT [facility] Provider Note   CSN: [id number] Arrival date & time: 09/03/23  2119     History  Chief Complaint  Patient presents with   Rectal Bleeding    Valerie [name] is a 63 y.o. female.  The history is provided by the patient.  Rectal Bleeding Quality:  Bright red Amount:  Copious Duration:  1 day Timing:  Intermittent Chronicity:  Recurrent Context: defecation   Context comment:  Abdominal cramping Similar prior episodes: yes   Relieved by:  Nothing Worsened by:  Nothing Ineffective treatments:  None tried Associated symptoms: abdominal pain   Associated symptoms: no fever   Risk factors: no anticoagulant use and no hx of IBD   Patient with a h/o colitis and BRBPR presents with same for 1 day.  Patient had multiple BMs and then just blood.  Patient contacted Dr. [name] Schlein of GI and was referred in for same.      Past Medical History:  Diagnosis Date   Acute ischemic colitis (HCC)    Anemia 05/01/2012   Anxiety    ARF (acute renal failure) (HCC) 12/28/2015   june acute kidney injury and july minor kidney injury 2020 also kidneys recovering   Arthritis    "basically ~ q joint" (12/28/2015)   Asthma    Basal cell carcinoma of upper back ~ 2011   Chronic kidney disease (CKD) stage G2/A1, mildly decreased glomerular filtration rate (GFR) between 60-89 mL/min/1.73 square meter and albuminuria creatinine ratio less than 30 mg/g    sees [facility] q year   Chronic right-sided headaches    and neck pain, relieved wiht PT and chronic practic manipulation   Complete tear of right rotator cuff, recurrent 04/09/2018   DDD (degenerative disc disease), lumbar    Diabetes mellitus without complication (HCC)    Diarrhea    EPISODIC   Fatigue    h/o excessive sedation, etiology unknown   Fibromyalgia    L3 nerve damage and right arm   First degree heart block    GERD (gastroesophageal reflux disease) 04/06/2017    Headache(784.0)    "muscular w/migraine intensity; ~ 5-6 months" (12/28/2015)   History of hiatal hernia    History of ischemic colitis 09/24/2016   x 2   Hypertension    Mental disorder    reports PTSD   Microscopic polyangiitis (HCC)    Nerve pain 2011   nerve damage to right arm; normal EMG/NCV to RUExt after shoulder surgery 12/2009   Other bursal cyst, right shoulder 04/09/2018   Pancreatitis last time yrs ago   due to Effexor   Pneumonia ~ 2014; 08/2015   Aspiration   Polymyalgia rheumatica (HCC) onset 08/2015   changed diagnosis to MPA, auto immune disorder resolved   Precancerous melanosis (HCC)    "2nd toe left foot; 5th toe of right foot; right temple; had them all cut off"   Sleep apnea    CPAP 5 to 15     Home Medications Prior to Admission medications   Medication Sig Start Date End Date Taking? Authorizing Provider  albuterol (PROAIR HFA) 108 (90 Base) MCG/ACT inhaler Inhale 2 puffs into the lungs every 4 (four) hours as needed for wheezing or shortness of breath. 04/24/18   Sherren Mocha, MD  allopurinol (ZYLOPRIM) 300 MG tablet TAKE 1/2 TABLET BY MOUTH ONCE A DAY Patient taking differently: Take 300 mg by mouth daily. 08/08/18   Sherren Mocha, MD  ALPRAZolam Prudy Feeler)  1 MG tablet Take 1 tablet (1 mg total) by mouth 3 (three) times daily as needed. for anxiety 08/08/18   Sherren Mocha, MD  azelastine (ASTELIN) 0.1 % nasal spray Place 2 sprays into both nostrils 2 (two) times daily. Use in each nostril as directed    [provider]  baclofen (LIORESAL) 10 MG tablet Take 10 mg by mouth 3 (three) times daily as needed for muscle spasms.     [provider]  benazepril (LOTENSIN) 40 MG tablet Take 1 tablet (40 mg total) by mouth daily. 08/08/18   Sherren Mocha, MD  CARAFATE 1 GM/10ML suspension TAKE 10 ML'S BY MOUTH FOUR TIMES A DAY AS NEEDED FOR STOMACH PAIN Patient taking differently: Take 1 g by mouth 3 (three) times daily as needed (burning in esophagus). 05/06/18    Sherren Mocha, MD  carboxymethylcellulose (REFRESH PLUS) 0.5 % SOLN Place 1 drop into both eyes 3 (three) times daily as needed (dry eyes).    [provider]  cetirizine (ZYRTEC) 10 MG tablet Take 10 mg by mouth daily.     [provider]  Cholecalciferol (VITAMIN D3) 5000 units CAPS Take 5,000 Units by mouth daily.     [provider]  dexlansoprazole (DEXILANT) 60 MG capsule Take 60 mg by mouth daily.     [provider]  fluticasone (FLONASE) 50 MCG/ACT nasal spray PLACE 2 SPRAYS INTO BOTH NOSTRILS DAILY 11/28/17   Sherren Mocha, MD  furosemide (LASIX) 20 MG tablet Take 20 mg by mouth daily as needed (fluid retention.).    [provider]  HYDROcodone-acetaminophen (NORCO) 10-325 MG per tablet Take 1-2 tablets by mouth every 6 (six) hours as needed for moderate pain (pain.).     [provider]  hydrOXYzine (ATARAX/VISTARIL) 10 MG tablet Take 1-3 tablets (10-30 mg total) by mouth 3 (three) times daily as needed for anxiety. 08/08/18   Sherren Mocha, MD  ibuprofen (ADVIL,MOTRIN) 600 MG tablet Take 600 mg by mouth daily as needed (migraines).    [provider]  Insulin Glargine (BASAGLAR KWIKPEN) 100 UNIT/ML Inject 35 Units into the skin every evening. 03/09/20   [provider]  insulin regular (NOVOLIN R) 100 units/mL injection as directed. CHECK GLUCOSE EVERY 4 HOURS. IF GLUCOSE  <150 GIVE NO INSULIN; IF 150-199 GIVE 2 UNITS Phillips; IF 200-249 GIVE 4 UNITS; IF 250-299 GIVE 6 UNITS; IF 300-349 GIVE 8 UNITS; IF 350-399 GIVE 10 UNITS; IF 400 AND ABOVE GIVE 12 UNITS 02/25/20   [provider]  Melatonin 5 MG TABS Take 10 mg by mouth at bedtime as needed (sleep).    [provider]  metoprolol succinate (TOPROL-XL) 100 MG 24 hr tablet Take 1 tablet (100 mg total) by mouth daily. Take with or immediately following a meal. 08/08/18   Sherren Mocha, MD  modafinil (PROVIGIL) 200 MG tablet Take 0.5 tablets (100 mg total) by mouth 2  (two) times daily. 04/03/23   Dohmeier, Porfirio Mylar, MD  montelukast (SINGULAIR) 10 MG tablet TAKE ONE TABLET BY MOUTH EVERY NIGHT AT BEDTIME Patient taking differently: Take 10 mg by mouth daily. 06/06/18   Sherren Mocha, MD  Multiple Vitamins-Minerals (MULTIVITAMIN PO) Take 1 tablet by mouth daily.    [provider]  Olopatadine HCl (PAZEO) 0.7 % SOLN Apply 1 drop to eye daily as needed (allergies). 08/08/18   Sherren Mocha, MD  ondansetron (ZOFRAN) 4 MG tablet Take 1 tablet (4 mg total) by mouth  every 8 (eight) hours as needed for nausea or vomiting. 11/18/16   Ofilia Neas, PA-C  Pancrelipase, Lip-Prot-Amyl, 24000-76000 units CPEP Take 24,000-72,000 Units by mouth 3 (three) times daily as needed (for pancreatitis).     [provider]  promethazine (PHENERGAN) 25 MG tablet Take 1 tablet (25 mg total) by mouth every 6 (six) hours as needed for nausea or vomiting. 08/08/18   Sherren Mocha, MD  torsemide (DEMADEX) 10 MG tablet Take 10 mg by mouth daily as needed (fluid). 03/18/21   [provider]      Allergies    Dilaudid [hydromorphone hcl], Effexor [venlafaxine], Pregabalin, Jardiance [empagliflozin], Adhesive [tape], Metoclopramide hcl, Neurontin [gabapentin], Serotonin reuptake inhibitors (ssris), and Metformin and related    Review of Systems   Review of Systems  Constitutional:  Negative for fever.  HENT:  Negative for facial swelling.   Respiratory:  Negative for wheezing and stridor.   Gastrointestinal:  Positive for abdominal pain, anal bleeding, blood in stool, hematochezia and nausea.  All other systems reviewed and are negative.   Physical Exam Updated Vital Signs BP 129/81 (BP Location: Left Wrist)   Pulse 86   Temp 98 F (36.7 C) (Oral)   Resp 18   Ht 5\' 5"  (1.651 m)   Wt (!) 141.1 kg   SpO2 99%   BMI 51.75 kg/m  Physical Exam Vitals and nursing note reviewed. Exam conducted with a chaperone present.  Constitutional:      General: She is not in  acute distress.    Appearance: Normal appearance. She is well-developed.  HENT:     Head: Normocephalic and atraumatic.     Nose: Nose normal.  Eyes:     Pupils: Pupils are equal, round, and reactive to light.  Cardiovascular:     Rate and Rhythm: Normal rate and regular rhythm.     Pulses: Normal pulses.     Heart sounds: Normal heart sounds.  Pulmonary:     Effort: Pulmonary effort is normal. No respiratory distress.     Breath sounds: Normal breath sounds.  Abdominal:     General: Bowel sounds are normal. There is no distension.     Palpations: Abdomen is soft.     Tenderness: There is no abdominal tenderness. There is no guarding or rebound.  Genitourinary:    Rectum: Guaiac result negative.  Musculoskeletal:        General: Normal range of motion.     Cervical back: Neck supple.  Skin:    General: Skin is dry.     Capillary Refill: Capillary refill takes less than 2 seconds.     Findings: No erythema or rash.  Neurological:     General: No focal deficit present.     Mental Status: She is alert.     Deep Tendon Reflexes: Reflexes normal.  Psychiatric:        Mood and Affect: Mood normal.     ED Results / Procedures / Treatments   Labs (all labs ordered are listed, but only abnormal results are displayed) Results for orders placed or performed during the hospital encounter of 09/03/23  Comprehensive metabolic panel   Collection Time: 09/03/23  9:31 PM  Result Value Ref Range   Sodium 137 135 - 145 mmol/L   Potassium 3.6 3.5 - 5.1 mmol/L   Chloride 105 98 - 111 mmol/L   CO2 23 22 - 32 mmol/L   Glucose, Bld 116 (H) 70 - 99 mg/dL   BUN 22  8 - 23 mg/dL   Creatinine, Ser 1.91 (H) 0.44 - 1.00 mg/dL   Calcium 9.7 8.9 - 47.8 mg/dL   Total Protein 7.8 6.5 - 8.1 g/dL   Albumin 4.2 3.5 - 5.0 g/dL   AST 19 15 - 41 U/L   ALT 18 0 - 44 U/L   Alkaline Phosphatase 50 38 - 126 U/L   Total Bilirubin 0.5 0.0 - 1.2 mg/dL   GFR, Estimated 57 (L) >60 mL/min   Anion gap 9 5 - 15   CBC   Collection Time: 09/03/23  9:31 PM  Result Value Ref Range   WBC 10.5 4.0 - 10.5 K/uL   RBC 4.40 3.87 - 5.11 MIL/uL   Hemoglobin 13.4 12.0 - 15.0 g/dL   HCT 29.5 62.1 - 30.8 %   MCV 94.5 80.0 - 100.0 fL   MCH 30.5 26.0 - 34.0 pg   MCHC 32.2 30.0 - 36.0 g/dL   RDW 65.7 84.6 - 96.2 %   Platelets 310 150 - 400 K/uL   nRBC 0.0 0.0 - 0.2 %  Type and screen [facility]   Collection Time: 09/03/23  9:31 PM  Result Value Ref Range   ABO/RH(D) B POS    Antibody Screen NEG    Sample Expiration      09/06/2023,2359 Performed at [facility], [address] [place], [place] [address]   POC occult blood, ED   Collection Time: 09/03/23 11:38 PM  Result Value Ref Range   Fecal Occult Bld NEGATIVE NEGATIVE   CT ABDOMEN PELVIS W CONTRAST Result Date: 09/04/2023 CLINICAL DATA:  Polytrauma, blunt Rectal bleeding, multiple episodes of seeing bright red blood in her stool 09/02/33, wbc's 10.5, GFR 57, negative stool card, hx of HTN, GERD, ischemic colitis, acute renal failure, pancreatitis, and basal cell ca 2001 EXAM: CT ABDOMEN AND PELVIS WITH CONTRAST TECHNIQUE: Multidetector CT imaging of the abdomen and pelvis was performed using the standard protocol following bolus administration of intravenous contrast. RADIATION DOSE REDUCTION: This exam was performed according to the departmental dose-optimization program which includes automated exposure control, adjustment of the mA and/or kV according to patient size and/or use of iterative reconstruction technique. CONTRAST:  OMNIPAQUE IOHEXOL 300 MG/ML  SOLN COMPARISON:  CT renal 11/20/2018 FINDINGS: Lower chest: Tiny hiatal hernia. Hepatobiliary: Liver is enlarged measuring up to 21 cm. No focal liver abnormality. Status post cholecystectomy. No biliary dilatation. Pancreas: No focal lesion. Normal pancreatic contour. No surrounding inflammatory changes. No main pancreatic ductal dilatation. Spleen: Normal in  size without focal abnormality.  Splenule noted. Adrenals/Urinary Tract: No adrenal nodule bilaterally. Bilateral kidneys enhance symmetrically. No hydronephrosis. No hydroureter. The urinary bladder is unremarkable. Stomach/Bowel: Stomach is within normal limits. No evidence of bowel wall thickening or dilatation. Colonic diverticulosis. Appendix appears normal. Vascular/Lymphatic: No abdominal aorta or iliac aneurysm. no abdominal, pelvic, or inguinal lymphadenopathy. Reproductive: Uterus and bilateral adnexa are unremarkable. Other: No intraperitoneal free fluid. No intraperitoneal free gas. No organized fluid collection. Musculoskeletal: No abdominal wall hernia or abnormality. No suspicious lytic or blastic osseous lesions. No acute displaced fracture. IMPRESSION: 1. Colonic diverticulosis with no acute diverticulitis. 2. Tiny hiatal hernia. 3. Hepatomegaly. 4. Status post cholecystectomy. 5. Limited evaluation on this noncontrast study. Electronically Signed   By: Tish Frederickson M.D.   On: 09/04/2023 02:03    Radiology CT ABDOMEN PELVIS W CONTRAST Result Date: 09/04/2023 CLINICAL DATA:  Polytrauma, blunt Rectal bleeding, multiple episodes of seeing bright red blood in her  stool 09/02/33, wbc's 10.5, GFR 57, negative stool card, hx of HTN, GERD, ischemic colitis, acute renal failure, pancreatitis, and basal cell ca 2001 EXAM: CT ABDOMEN AND PELVIS WITH CONTRAST TECHNIQUE: Multidetector CT imaging of the abdomen and pelvis was performed using the standard protocol following bolus administration of intravenous contrast. RADIATION DOSE REDUCTION: This exam was performed according to the departmental dose-optimization program which includes automated exposure control, adjustment of the mA and/or kV according to patient size and/or use of iterative reconstruction technique. CONTRAST:  OMNIPAQUE IOHEXOL 300 MG/ML  SOLN COMPARISON:  CT renal 11/20/2018 FINDINGS: Lower chest: Tiny hiatal hernia.  Hepatobiliary: Liver is enlarged measuring up to 21 cm. No focal liver abnormality. Status post cholecystectomy. No biliary dilatation. Pancreas: No focal lesion. Normal pancreatic contour. No surrounding inflammatory changes. No main pancreatic ductal dilatation. Spleen: Normal in size without focal abnormality.  Splenule noted. Adrenals/Urinary Tract: No adrenal nodule bilaterally. Bilateral kidneys enhance symmetrically. No hydronephrosis. No hydroureter. The urinary bladder is unremarkable. Stomach/Bowel: Stomach is within normal limits. No evidence of bowel wall thickening or dilatation. Colonic diverticulosis. Appendix appears normal. Vascular/Lymphatic: No abdominal aorta or iliac aneurysm. no abdominal, pelvic, or inguinal lymphadenopathy. Reproductive: Uterus and bilateral adnexa are unremarkable. Other: No intraperitoneal free fluid. No intraperitoneal free gas. No organized fluid collection. Musculoskeletal: No abdominal wall hernia or abnormality. No suspicious lytic or blastic osseous lesions. No acute displaced fracture. IMPRESSION: 1. Colonic diverticulosis with no acute diverticulitis. 2. Tiny hiatal hernia. 3. Hepatomegaly. 4. Status post cholecystectomy. 5. Limited evaluation on this noncontrast study. Electronically Signed   By: Tish Frederickson M.D.   On: 09/04/2023 02:03    Procedures Procedures    Medications Ordered in ED Medications  lactated ringers infusion (has no administration in time range)  iohexol (OMNIPAQUE) 300 MG/ML solution 100 mL (100 mLs Intravenous Contrast Given 09/04/23 0134)  fentaNYL (SUBLIMAZE) injection 50 mcg (50 mcg Intravenous Given 09/04/23 0156)  ondansetron (ZOFRAN) injection 4 mg (4 mg Intravenous Given 09/04/23 0156)    ED Course/ Medical Decision Making/ A&P                                 Medical Decision Making Amount and/or Complexity of Data Reviewed Labs: ordered.    Details: Normal sodium 137, normal potassium 3.6, elevated creatinine  1.11, normal LFTs. Normal white count 10.5, normal hemoglobin 13.4, normal platelets  Radiology: ordered and independent interpretation performed.    Details: CT does in fact have contrast I contacting radiology to correct this.  Dr. Andria Meuse of radiology concurs that the study was ordered with contrast and does in fact have IV contrast. No SBO by me  Discussion of management or test interpretation with external provider(s): 2:43 AM Secure chat sent to Dr. [name] Schlein  Risk Prescription drug management. Decision regarding hospitalization.    Final Clinical Impression(s) / ED Diagnoses Final diagnoses:  Gastrointestinal hemorrhage, unspecified gastrointestinal hemorrhage type   The patient appears reasonably stabilized for admission considering the current resources, flow, and capabilities available in the ED at this time, and I doubt any other [facility] requiring further screening and/or treatment in the ED prior to admission.  Rx / DC Orders ED Discharge Orders     None         Lyndall Windt, MD 09/04/23 [phone]

## 2023-09-04 NOTE — ED Notes (Addendum)
 Pt ambulatory to restroom with a steady gait and no assistance.

## 2023-09-04 NOTE — Assessment & Plan Note (Signed)
-  Continue as needed analgesics and muscle relaxant.

## 2023-09-04 NOTE — Assessment & Plan Note (Signed)
-  Presentation and a stable hemoglobin suggesting diverticular bleed -Follow hemoglobin trend -Allow for clear liquid diet -GI service has been consulted and will follow any recommendation -At this moment doubt the need for any in-house endoscopic evaluation.

## 2023-09-04 NOTE — Assessment & Plan Note (Signed)
-  Body mass index is 51.75 kg/m. -Low-calorie diet, portion control and increase physical activity discussed with patient.

## 2023-09-05 DIAGNOSIS — K625 Hemorrhage of anus and rectum: Secondary | ICD-10-CM | POA: Diagnosis not present

## 2023-09-05 DIAGNOSIS — I10 Essential (primary) hypertension: Secondary | ICD-10-CM | POA: Diagnosis not present

## 2023-09-05 DIAGNOSIS — M797 Fibromyalgia: Secondary | ICD-10-CM | POA: Diagnosis not present

## 2023-09-05 LAB — COMPREHENSIVE METABOLIC PANEL
ALT: 17 U/L (ref 0–44)
AST: 15 U/L (ref 15–41)
Albumin: 3.6 g/dL (ref 3.5–5.0)
Alkaline Phosphatase: 38 U/L (ref 38–126)
Anion gap: 9 (ref 5–15)
BUN: 14 mg/dL (ref 8–23)
CO2: 22 mmol/L (ref 22–32)
Calcium: 9.4 mg/dL (ref 8.9–10.3)
Chloride: 105 mmol/L (ref 98–111)
Creatinine, Ser: 1.02 mg/dL — ABNORMAL HIGH (ref 0.44–1.00)
GFR, Estimated: >60 mL/min (ref >60)
Glucose, Bld: 102 mg/dL — ABNORMAL HIGH (ref 70–99)
Potassium: 3.8 mmol/L (ref 3.5–5.1)
Sodium: 136 mmol/L (ref 135–145)
Total Bilirubin: 0.5 mg/dL (ref 0.0–1.2)
Total Protein: 6.6 g/dL (ref 6.5–8.1)

## 2023-09-05 LAB — CBC
HCT: 38.1 % (ref 36.0–46.0)
HCT: 43.3 % (ref 36.0–46.0)
Hemoglobin: 12 g/dL (ref 12.0–15.0)
Hemoglobin: 13.5 g/dL (ref 12.0–15.0)
MCH: 30.2 pg (ref 26.0–34.0)
MCH: 30.4 pg (ref 26.0–34.0)
MCHC: 31.2 g/dL (ref 30.0–36.0)
MCHC: 31.5 g/dL (ref 30.0–36.0)
MCV: 95.7 fL (ref 80.0–100.0)
MCV: 97.5 fL (ref 80.0–100.0)
Platelets: 238 10*3/uL (ref 150–400)
Platelets: 346 10*3/uL (ref 150–400)
RBC: 3.98 MIL/uL (ref 3.87–5.11)
RBC: 4.44 MIL/uL (ref 3.87–5.11)
RDW: 13.7 % (ref 11.5–15.5)
RDW: 13.8 % (ref 11.5–15.5)
WBC: 10.7 10*3/uL — ABNORMAL HIGH (ref 4.0–10.5)
WBC: 8 10*3/uL (ref 4.0–10.5)
nRBC: 0 % (ref 0.0–0.2)
nRBC: 0 % (ref 0.0–0.2)

## 2023-09-05 LAB — GLUCOSE, CAPILLARY
Glucose-Capillary: 101 mg/dL — ABNORMAL HIGH (ref 70–99)
Glucose-Capillary: 104 mg/dL — ABNORMAL HIGH (ref 70–99)

## 2023-09-05 MED ORDER — CIPROFLOXACIN HCL 500 MG PO TABS: 500.0000 mg | ORAL_TABLET | Freq: Two times a day (BID) | ORAL | 0 refills | Status: AC

## 2023-09-05 NOTE — Discharge Summary (Signed)
 [facility] Physician Discharge Summary   Patient name: Valerie [name]  Admit date:     09/03/2023  Discharge date: 09/05/2023  Attending Physician: Eduard Clos [[id number]]  Discharge Physician: Carollee Herter   PCP: Porfirio Oar, PA  Admitted From: Home Disposition:  Home  Recommendations for Outpatient Follow-up:  Follow up with PCP in 1-2 weeks  Home Health:No Equipment/Devices: None    Discharge Condition:Stable CODE STATUS:FULL Diet recommendation: Heart Healthy Fluid Restriction: None  Hospital Summary: HPI:  Valerie [name] is a 63 y.o. female with medical history significant of morbid obesity, hypertension, gastroesophageal reflux disease, chronic kidney disease stage II, asthma, anxiety, obstructive sleep apnea, fibromyalgia/chronic pain syndrome, IBS and type 2 diabetes with nephropathy; who presented to the hospital with concerns of abdominal pain and rectal bleeding.  Patient reports sudden presentation of symptoms approximately 24 hours prior to admission; she expressed experiencing sudden intermittent abdominal discomfort and was planning to follow-up with her gastroenterologist as an outpatient; unfortunately symptoms worsen and she ended noticing bright red blood per rectum and around 8 PM the night prior to admission.  Patient reported associated crampy lower abdominal pain and also intermittent midepigastric discomfort.  She is present having some nausea but no vomiting.  Patient with prior history of ischemic colitis on previous hospitalization (x 2) continues presentation is completely different according to patient reports.   She has present no chest pain, no shortness of breath, no vomiting, no dysuria, no hematuria, no focal weaknesses or any other complaints.   Patient had another episode of bloody stools while in the ED. fluid resuscitation has been initiated; CT scan demonstrating no acute intra-abdominal abnormality, stable hemoglobin and also  stable hemodynamically vital signs.  TR[facility] and gastroenterology service has been consulted.  Significant Events: Admitted 09/03/2023 for rectal bleeding   Significant Labs: WBC 10.5, HgB 13.4, plt 310 Na 137, K 3.6, CO2 of 23, BUN 22, Scr 1.11, glu 116  Significant Imaging Studies: CT abd/pelvis Colonic diverticulosis with no acute diverticulitis. 2. Tiny hiatal hernia. 3. Hepatomegaly. 4. Status post cholecystectomy.  Antibiotic Therapy: Anti-infectives (From admission, onward)    None       Procedures:   Consultants:    Hospital Course by Problem: * Rectal bleeding 09-05-2023 no further rectal bleeding. HgB stable at 13.5 g/dl.   Fibromyalgia 09-05-2023 stable. Continue home meds  Type 2 diabetes mellitus with chronic painful diabetic neuropathy (HCC) 09-05-2023 stable. Continue home meds  Excessive daytime sleepiness 09-05-2023 stable. Continue home meds  GERD (gastroesophageal reflux disease) 09-05-2023 stable. Continue home meds  OSA on CPAP 09-05-2023 stable. Continue home meds  Morbid obesity with body mass index (BMI) greater than or equal to 50 (HCC) 09-05-2023 stable. Continue home meds  IBS (irritable bowel syndrome) 09-05-2023 stable. Continue home meds  Essential hypertension 09-05-2023 stable. Continue home meds   Dysuria - pt c/o of some dysuria. Gets frequent UTIs when she doesn't get enough liquids to drink. Pt to hydrate aggressively today. If she still has dysuria tomorrow, she can start 3 days of po cipro. Discharge Diagnoses:  Principal Problem:   Rectal bleeding Active Problems:   Fibromyalgia   Essential hypertension   IBS (irritable bowel syndrome)   Morbid obesity with body mass index (BMI) greater than or equal to 50 (HCC)   OSA on CPAP   GERD (gastroesophageal reflux disease)   Excessive daytime sleepiness   Type 2 diabetes mellitus with chronic painful diabetic neuropathy [facility])   Discharge Instructions  Discharge  Instructions     Call MD for:  difficulty breathing, headache or visual disturbances   Complete by: As directed    Call MD for:  extreme fatigue   Complete by: As directed    Call MD for:  hives   Complete by: As directed    Call MD for:  persistant dizziness or light-headedness   Complete by: As directed    Call MD for:  persistant nausea and vomiting   Complete by: As directed    Call MD for:  redness, tenderness, or signs of infection (pain, swelling, redness, odor or green/yellow discharge around incision site)   Complete by: As directed    Call MD for:  severe uncontrolled pain   Complete by: As directed    Call MD for:  temperature >100.4   Complete by: As directed    Diet - low sodium heart healthy   Complete by: As directed    Discharge instructions   Complete by: As directed    1. Follow up with your primary care provider in 1-2 weeks following discharge from hospital.   Increase activity slowly   Complete by: As directed       Allergies as of 09/05/2023       Reactions   Dilaudid [hydromorphone Hcl] Other (See Comments)   Headache    Effexor [venlafaxine]    Pancreatitis, bladder & bowel incontinence   Pregabalin Other (See Comments)   Drug induced stupor   Jardiance [empagliflozin] Other (See Comments)   "Pancreatic pain."   Adhesive [tape]    Paper tape causes blood red marks   Metoclopramide Hcl Other (See Comments)   "crawling inside"   Neurontin [gabapentin] Other (See Comments)   Uncontrollable twitches and movements   Serotonin Reuptake Inhibitors (ssris) Other (See Comments)   Damage to pancreas and bowel/urinary incontinence    Metformin And Related Diarrhea   Severe gastropresis        Medication List     TAKE these medications    albuterol 108 (90 Base) MCG/ACT inhaler Commonly known as: ProAir HFA Inhale 2 puffs into the lungs every 4 (four) hours as needed for wheezing or shortness of breath.   allopurinol 300 MG tablet Commonly  known as: ZYLOPRIM TAKE 1/2 TABLET BY MOUTH ONCE A DAY What changed:  how much to take how to take this when to take this additional instructions   ALPRAZolam 1 MG tablet Commonly known as: XANAX Take 1 tablet (1 mg total) by mouth 3 (three) times daily as needed. for anxiety   baclofen 10 MG tablet Commonly known as: LIORESAL Take 10 mg by mouth 3 (three) times daily as needed for muscle spasms.   Basaglar KwikPen 100 UNIT/ML Inject 40 Units into the skin every evening.   BD Pen Needle Nano 2nd Gen 32G X 4 MM Misc Generic drug: Insulin Pen Needle   benazepril 40 MG tablet Commonly known as: LOTENSIN Take 1 tablet (40 mg total) by mouth daily.   Carafate 1 GM/10ML suspension Generic drug: sucralfate TAKE 10 ML'S BY MOUTH FOUR TIMES A DAY AS NEEDED FOR STOMACH PAIN What changed: See the new instructions.   carboxymethylcellulose 0.5 % Soln Commonly known as: REFRESH PLUS Place 1 drop into both eyes 3 (three) times daily as needed (dry eyes).   cetirizine 10 MG tablet Commonly known as: ZYRTEC Take 10 mg by mouth daily.   ciprofloxacin 500 MG tablet Commonly known as: Cipro Take 1 tablet (500 mg total) by mouth  2 (two) times daily for 3 days.   dexlansoprazole 60 MG capsule Commonly known as: DEXILANT Take 60 mg by mouth daily.   fexofenadine 180 MG tablet Commonly known as: ALLEGRA Take 180 mg by mouth daily.   fluticasone 50 MCG/ACT nasal spray Commonly known as: FLONASE PLACE 2 SPRAYS INTO BOTH NOSTRILS DAILY   furosemide 20 MG tablet Commonly known as: LASIX Take 20 mg by mouth daily as needed (fluid retention.).   HYDROcodone-acetaminophen 10-325 MG tablet Commonly known as: NORCO Take 1-2 tablets by mouth every 6 (six) hours as needed for moderate pain (pain.).   hydrOXYzine 10 MG tablet Commonly known as: ATARAX Take 1-3 tablets (10-30 mg total) by mouth 3 (three) times daily as needed for anxiety.   ibuprofen 600 MG tablet Commonly known as:  ADVIL Take 600 mg by mouth daily as needed (migraines).   insulin regular 100 units/mL injection Commonly known as: NOVOLIN R as directed. CHECK GLUCOSE EVERY 4 HOURS. IF GLUCOSE  <150 GIVE NO INSULIN; IF 150-199 GIVE 2 UNITS [place]; IF 200-249 GIVE 4 UNITS; IF 250-299 GIVE 6 UNITS; IF 300-349 GIVE 8 UNITS; IF 350-399 GIVE 10 UNITS; IF 400 AND ABOVE GIVE 12 UNITS   Insulin Syringe-Needle U-100 30G X 1/2" 1 ML Misc USE TO GIVE INSULIN FOUR TIMES A DAY WITH SLIDING SCALE   lidocaine-prilocaine cream Commonly known as: EMLA Apply 1 Application topically once.   melatonin 5 MG Tabs Take 10 mg by mouth at bedtime as needed (sleep).   metoprolol succinate 100 MG 24 hr tablet Commonly known as: TOPROL-XL Take 1 tablet (100 mg total) by mouth daily. Take with or immediately following a meal.   modafinil 200 MG tablet Commonly known as: PROVIGIL Take 0.5 tablets (100 mg total) by mouth 2 (two) times daily.   montelukast 10 MG tablet Commonly known as: SINGULAIR TAKE ONE TABLET BY MOUTH EVERY NIGHT AT BEDTIME What changed: when to take this   MULTIVITAMIN PO Take 1 tablet by mouth daily.   Olopatadine HCl 0.7 % Soln Commonly known as: [place] Apply 1 drop to eye daily as needed (allergies).   ondansetron 4 MG tablet Commonly known as: Zofran Take 1 tablet (4 mg total) by mouth every 8 (eight) hours as needed for nausea or vomiting.   Pancrelipase (Lip-Prot-Amyl) 24000-76000 units Cpep Take 24,000-72,000 Units by mouth 3 (three) times daily as needed (for pancreatitis).   promethazine 25 MG tablet Commonly known as: PHENERGAN Take 1 tablet (25 mg total) by mouth every 6 (six) hours as needed for nausea or vomiting.   torsemide 10 MG tablet Commonly known as: DEMADEX Take 10 mg by mouth daily as needed (fluid).   triamcinolone cream 0.1 % Commonly known as: KENALOG Apply 1 Application topically as needed (Skin irritation).   Vitamin D3 125 MCG (5000 UT) Caps Take 5,000 Units by  mouth daily.        Allergies  Allergen Reactions   Dilaudid [Hydromorphone Hcl] Other (See Comments)    Headache    Effexor [Venlafaxine]     Pancreatitis, bladder & bowel incontinence   Pregabalin Other (See Comments)    Drug induced stupor   Jardiance [Empagliflozin] Other (See Comments)    "Pancreatic pain."    Adhesive [Tape]     Paper tape causes blood red marks   Metoclopramide Hcl Other (See Comments)    "crawling inside"   Neurontin [Gabapentin] Other (See Comments)    Uncontrollable twitches and movements   Serotonin Reuptake Inhibitors (  Ssris) Other (See Comments)    Damage to pancreas and bowel/urinary incontinence    Metformin And Related Diarrhea    Severe gastropresis     Discharge Exam: Vitals:   09/05/23 0212 09/05/23 1228  BP: 131/83 (!) 144/77  Pulse: 77 65  Resp: 16 18  Temp: 98 F (36.7 C) 98.8 F (37.1 C)  SpO2: 100% 100%    Physical Exam Vitals and nursing note reviewed.  Constitutional:      General: She is not in acute distress.    Appearance: She is obese. She is not toxic-appearing or diaphoretic.  HENT:     Head: Normocephalic and atraumatic.     Nose: Nose normal.  Eyes:     General: No scleral icterus. Cardiovascular:     Rate and Rhythm: Normal rate and regular rhythm.  Pulmonary:     Effort: Pulmonary effort is normal.     Breath sounds: Normal breath sounds.  Abdominal:     General: Bowel sounds are normal. There is no distension.     Palpations: Abdomen is soft.     Tenderness: There is no abdominal tenderness.  Musculoskeletal:     Right lower leg: No edema.     Left lower leg: No edema.  Skin:    General: Skin is warm and dry.     Capillary Refill: Capillary refill takes less than 2 seconds.  Neurological:     General: No focal deficit present.     Mental Status: She is alert and oriented to person, place, and time.     The results of significant diagnostics from this hospitalization (including imaging,  microbiology, ancillary and laboratory) are listed below for reference.     Labs: Basic Metabolic Panel: Recent Labs  Lab 09/03/23 2131 09/05/23 0017  NA 137 136  K 3.6 3.8  CL 105 105  CO2 23 22  GLUCOSE 116* 102*  BUN 22 14  CREATININE 1.11* 1.02*  CALCIUM 9.7 9.4   Liver Function Tests: Recent Labs  Lab 09/03/23 2131 09/05/23 0017  AST 19 15  ALT 18 17  ALKPHOS 50 38  BILITOT 0.5 0.5  PROT 7.8 6.6  ALBUMIN 4.2 3.6   CBC: Recent Labs  Lab 09/04/23 0307 09/04/23 0728 09/04/23 1724 09/05/23 0017 09/05/23 1029  WBC 11.0* 9.8 8.5 8.0 10.7*  HGB 12.9 13.1 12.3 12.0 13.5  HCT 39.5 40.4 37.9 38.1 43.3  MCV 94.7 94.6 95.7 95.7 97.5  PLT 268 285 258 238 346   CBG: Recent Labs  Lab 09/04/23 1120 09/04/23 1726 09/04/23 2215 09/05/23 0750 09/05/23 1208  GLUCAP 125* 98 116* 101* 104*   Hgb A1c Recent Labs    09/04/23 0307  HGBA1C 5.5   Sepsis Labs Recent Labs  Lab 09/04/23 0728 09/04/23 1724 09/05/23 0017 09/05/23 1029  WBC 9.8 8.5 8.0 10.7*    Procedures/Studies: CT ABDOMEN PELVIS W CONTRAST Addendum Date: 09/04/2023 ADDENDUM REPORT: 09/04/2023 17:00 ADDENDUM: Please note impression should not state limited evaluation on this noncontrast study. This is a study with contrast. Electronically Signed   By: Tish Frederickson M.D.   On: 09/04/2023 17:00   Result Date: 09/04/2023 CLINICAL DATA:  Polytrauma, blunt Rectal bleeding, multiple episodes of seeing bright red blood in her stool 09/02/33, wbc's 10.5, GFR 57, negative stool card, hx of HTN, GERD, ischemic colitis, acute renal failure, pancreatitis, and basal cell ca 2001 EXAM: CT ABDOMEN AND PELVIS WITH CONTRAST TECHNIQUE: Multidetector CT imaging of the abdomen and pelvis was performed using  the standard protocol following bolus administration of intravenous contrast. RADIATION DOSE REDUCTION: This exam was performed according to the departmental dose-optimization program which includes automated exposure  control, adjustment of the mA and/or kV according to patient size and/or use of iterative reconstruction technique. CONTRAST:  OMNIPAQUE IOHEXOL 300 MG/ML  SOLN COMPARISON:  CT renal 11/20/2018 FINDINGS: Lower chest: Tiny hiatal hernia. Hepatobiliary: Liver is enlarged measuring up to 21 cm. No focal liver abnormality. Status post cholecystectomy. No biliary dilatation. Pancreas: No focal lesion. Normal pancreatic contour. No surrounding inflammatory changes. No main pancreatic ductal dilatation. Spleen: Normal in size without focal abnormality.  Splenule noted. Adrenals/Urinary Tract: No adrenal nodule bilaterally. Bilateral kidneys enhance symmetrically. No hydronephrosis. No hydroureter. The urinary bladder is unremarkable. Stomach/Bowel: Stomach is within normal limits. No evidence of bowel wall thickening or dilatation. Colonic diverticulosis. Appendix appears normal. Vascular/Lymphatic: No abdominal aorta or iliac aneurysm. no abdominal, pelvic, or inguinal lymphadenopathy. Reproductive: Uterus and bilateral adnexa are unremarkable. Other: No intraperitoneal free fluid. No intraperitoneal free gas. No organized fluid collection. Musculoskeletal: No abdominal wall hernia or abnormality. No suspicious lytic or blastic osseous lesions. No acute displaced fracture. IMPRESSION: 1. Colonic diverticulosis with no acute diverticulitis. 2. Tiny hiatal hernia. 3. Hepatomegaly. 4. Status post cholecystectomy. 5. Limited evaluation on this noncontrast study. Electronically Signed: By: Tish Frederickson M.D. On: 09/04/2023 02:03    Time coordinating discharge: 45 mins  SIGNED:  Carollee Herter, DO [facility] 09/05/23, 12:39 PM

## 2023-09-05 NOTE — Assessment & Plan Note (Signed)
 09-05-2023 stable. Continue home meds

## 2023-09-05 NOTE — Hospital Course (Signed)
 HPI:  Valerie [name] is a 63 y.o. female with medical history significant of morbid obesity, hypertension, gastroesophageal reflux disease, chronic kidney disease stage II, asthma, anxiety, obstructive sleep apnea, fibromyalgia/chronic pain syndrome, IBS and type 2 diabetes with nephropathy; who presented to the hospital with concerns of abdominal pain and rectal bleeding.  Patient reports sudden presentation of symptoms approximately 24 hours prior to admission; she expressed experiencing sudden intermittent abdominal discomfort and was planning to follow-up with her gastroenterologist as an outpatient; unfortunately symptoms worsen and she ended noticing bright red blood per rectum and around 8 PM the night prior to admission.  Patient reported associated crampy lower abdominal pain and also intermittent midepigastric discomfort.  She is present having some nausea but no vomiting.  Patient with prior history of ischemic colitis on previous hospitalization (x 2) continues presentation is completely different according to patient reports.   She has present no chest pain, no shortness of breath, no vomiting, no dysuria, no hematuria, no focal weaknesses or any other complaints.   Patient had another episode of bloody stools while in the ED. fluid resuscitation has been initiated; CT scan demonstrating no acute intra-abdominal abnormality, stable hemoglobin and also stable hemodynamically vital signs.  TRH and gastroenterology service has been consulted.  Significant Events: Admitted 09/03/2023 for rectal bleeding   Significant Labs: WBC 10.5, HgB 13.4, plt 310 Na 137, K 3.6, CO2 of 23, BUN 22, Scr 1.11, glu 116  Significant Imaging Studies: CT abd/pelvis Colonic diverticulosis with no acute diverticulitis. 2. Tiny hiatal hernia. 3. Hepatomegaly. 4. Status post cholecystectomy.  Antibiotic Therapy: Anti-infectives (From admission, onward)    None       Procedures:   Consultants:

## 2023-09-05 NOTE — Plan of Care (Signed)

## 2023-09-05 NOTE — Assessment & Plan Note (Signed)
 09-05-2023 no further rectal bleeding. HgB stable at 13.5 g/dl.

## 2023-09-05 NOTE — Progress Notes (Signed)
 [facility] Gastroenterology Progress Note  Valerie [name] 63 y.o. [birth year]   Subjective: No BM or bleeding overnight. Feels ok.  Objective: Vital signs: Vitals:   09/04/23 2205 09/05/23 0212  BP: (!) 161/92 131/83  Pulse: 68 77  Resp: 13 16  Temp: 97.8 F (36.6 C) 98 F (36.7 C)  SpO2: 98% 100%    Physical Exam: Gen: alert, no acute distress, obese, pleasant  HEENT: anicteric sclera CV: RRR Chest: CTA B Abd: soft, nontender, nondistended, +BS, obese Ext: no edema  Lab Results: Recent Labs    09/03/23 2131 09/05/23 0017  NA 137 136  K 3.6 3.8  CL 105 105  CO2 23 22  GLUCOSE 116* 102*  BUN 22 14  CREATININE 1.11* 1.02*  CALCIUM 9.7 9.4   Recent Labs    09/03/23 2131 09/05/23 0017  AST 19 15  ALT 18 17  ALKPHOS 50 38  BILITOT 0.5 0.5  PROT 7.8 6.6  ALBUMIN 4.2 3.6   Recent Labs    09/05/23 0017 09/05/23 1029  WBC 8.0 10.7*  HGB 12.0 13.5  HCT 38.1 43.3  MCV 95.7 97.5  PLT 238 346      Assessment/Plan: Rectal bleeding - resolved. Hgb 13.5. Changed to soft diet. Ok to d/c home today from GI standpoint. Will sign off. Call if questions.   Shirley Friar 09/05/2023, 11:05 AM  Questions please call [phone] ID: Valerie [name], female   DOB: 1961/05/27, 63 y.o.   MRN: [id number]

## 2023-09-05 NOTE — Progress Notes (Signed)
 PROGRESS NOTE    [name]  UEA:[id number] DOB: Aug 07, 1960 DOA: 09/03/2023 PCP: Porfirio Oar, PA  Subjective: Pt seen and examined. No further rectal bleeding. GI has given clearance for discharge.   Hospital Course: HPI:  Valerie [name] is a 63 y.o. female with medical history significant of morbid obesity, hypertension, gastroesophageal reflux disease, chronic kidney disease stage II, asthma, anxiety, obstructive sleep apnea, fibromyalgia/chronic pain syndrome, IBS and type 2 diabetes with nephropathy; who presented to the hospital with concerns of abdominal pain and rectal bleeding.  Patient reports sudden presentation of symptoms approximately 24 hours prior to admission; she expressed experiencing sudden intermittent abdominal discomfort and was planning to follow-up with her gastroenterologist as an outpatient; unfortunately symptoms worsen and she ended noticing bright red blood per rectum and around 8 PM the night prior to admission.  Patient reported associated crampy lower abdominal pain and also intermittent midepigastric discomfort.  She is present having some nausea but no vomiting.  Patient with prior history of ischemic colitis on previous hospitalization (x 2) continues presentation is completely different according to patient reports.   She has present no chest pain, no shortness of breath, no vomiting, no dysuria, no hematuria, no focal weaknesses or any other complaints.   Patient had another episode of bloody stools while in the ED. fluid resuscitation has been initiated; CT scan demonstrating no acute intra-abdominal abnormality, stable hemoglobin and also stable hemodynamically vital signs.  TR[facility] and gastroenterology service has been consulted.  Significant Events: Admitted 09/03/2023 for rectal bleeding   Significant Labs: WBC 10.5, HgB 13.4, plt 310 Na 137, K 3.6, CO2 of 23, BUN 22, Scr 1.11, glu 116  Significant Imaging Studies: CT abd/pelvis Colonic  diverticulosis with no acute diverticulitis. 2. Tiny hiatal hernia. 3. Hepatomegaly. 4. Status post cholecystectomy.  Antibiotic Therapy: Anti-infectives (From admission, onward)    None       Procedures:   Consultants:     Assessment and Plan: * Rectal bleeding 09-05-2023 no further rectal bleeding. HgB stable at 13.5 g/dl.   Fibromyalgia 09-05-2023 stable. Continue home meds  Type 2 diabetes mellitus with chronic painful diabetic neuropathy (HCC) 09-05-2023 stable. Continue home meds  Excessive daytime sleepiness 09-05-2023 stable. Continue home meds  GERD (gastroesophageal reflux disease) 09-05-2023 stable. Continue home meds  OSA on CPAP 09-05-2023 stable. Continue home meds  Morbid obesity with body mass index (BMI) greater than or equal to 50 (HCC) 09-05-2023 stable. Continue home meds  IBS (irritable bowel syndrome) 09-05-2023 stable. Continue home meds  Essential hypertension 09-05-2023 stable. Continue home meds   DVT prophylaxis: SCDs Start: 09/04/23 0815  SCDs   Code Status: Full Code Family Communication: no family at bedside. Pt is decisional. Disposition Plan: return home Reason for continuing need for hospitalization: medically stable for DC.  Objective: Vitals:   09/04/23 2119 09/04/23 2205 09/05/23 0212 09/05/23 1228  BP:  (!) 161/92 131/83 (!) 144/77  Pulse:  68 77 65  Resp:  13 16 18   Temp: 98.4 F (36.9 C) 97.8 F (36.6 C) 98 F (36.7 C) 98.8 F (37.1 C)  TempSrc: Oral Oral  Oral  SpO2:  98% 100% 100%  Weight:      Height:       No intake or output data in the 24 hours ending 09/05/23 1238 Filed Weights   09/03/23 2130  Weight: (!) 141.1 kg    Examination:  Physical Exam Vitals and nursing note reviewed.  Constitutional:      General: She  is not in acute distress.    Appearance: She is obese. She is not toxic-appearing or diaphoretic.  HENT:     Head: Normocephalic and atraumatic.     Nose: Nose normal.  Eyes:      General: No scleral icterus. Cardiovascular:     Rate and Rhythm: Normal rate and regular rhythm.  Pulmonary:     Effort: Pulmonary effort is normal.     Breath sounds: Normal breath sounds.  Abdominal:     General: Bowel sounds are normal. There is no distension.     Palpations: Abdomen is soft.     Tenderness: There is no abdominal tenderness.  Musculoskeletal:     Right lower leg: No edema.     Left lower leg: No edema.  Skin:    General: Skin is warm and dry.     Capillary Refill: Capillary refill takes less than 2 seconds.  Neurological:     General: No focal deficit present.     Mental Status: She is alert and oriented to person, place, and time.     Data Reviewed: I have personally reviewed following labs and imaging studies  CBC: Recent Labs  Lab 09/04/23 0307 09/04/23 0728 09/04/23 1724 09/05/23 0017 09/05/23 1029  WBC 11.0* 9.8 8.5 8.0 10.7*  HGB 12.9 13.1 12.3 12.0 13.5  HCT 39.5 40.4 37.9 38.1 43.3  MCV 94.7 94.6 95.7 95.7 97.5  PLT 268 285 258 238 346   Basic Metabolic Panel: Recent Labs  Lab 09/03/23 2131 09/05/23 0017  NA 137 136  K 3.6 3.8  CL 105 105  CO2 23 22  GLUCOSE 116* 102*  BUN 22 14  CREATININE 1.11* 1.02*  CALCIUM 9.7 9.4   GFR: Estimated Creatinine Clearance: 82.8 mL/min (A) (by C-G formula based on SCr of 1.02 mg/dL (H)). Liver Function Tests: Recent Labs  Lab 09/03/23 2131 09/05/23 0017  AST 19 15  ALT 18 17  ALKPHOS 50 38  BILITOT 0.5 0.5  PROT 7.8 6.6  ALBUMIN 4.2 3.6   HbA1C: Recent Labs    09/04/23 0307  HGBA1C 5.5   CBG: Recent Labs  Lab 09/04/23 1120 09/04/23 1726 09/04/23 2215 09/05/23 0750 09/05/23 1208  GLUCAP 125* 98 116* 101* 104*   Sepsis Labs: Recent Labs  Lab 09/04/23 0307  LATICACIDVEN 0.8    Radiology Studies: CT ABDOMEN PELVIS W CONTRAST Addendum Date: 09/04/2023 ADDENDUM REPORT: 09/04/2023 17:00 ADDENDUM: Please note impression should not state limited evaluation on this  noncontrast study. This is a study with contrast. Electronically Signed   By: Tish Frederickson M.D.   On: 09/04/2023 17:00   Result Date: 09/04/2023 CLINICAL DATA:  Polytrauma, blunt Rectal bleeding, multiple episodes of seeing bright red blood in her stool 09/02/33, wbc's 10.5, GFR 57, negative stool card, hx of HTN, GERD, ischemic colitis, acute renal failure, pancreatitis, and basal cell ca 2001 EXAM: CT ABDOMEN AND PELVIS WITH CONTRAST TECHNIQUE: Multidetector CT imaging of the abdomen and pelvis was performed using the standard protocol following bolus administration of intravenous contrast. RADIATION DOSE REDUCTION: This exam was performed according to the departmental dose-optimization program which includes automated exposure control, adjustment of the mA and/or kV according to patient size and/or use of iterative reconstruction technique. CONTRAST:  OMNIPAQUE IOHEXOL 300 MG/ML  SOLN COMPARISON:  CT renal 11/20/2018 FINDINGS: Lower chest: Tiny hiatal hernia. Hepatobiliary: Liver is enlarged measuring up to 21 cm. No focal liver abnormality. Status post cholecystectomy. No biliary dilatation. Pancreas: No focal lesion. Normal  pancreatic contour. No surrounding inflammatory changes. No main pancreatic ductal dilatation. Spleen: Normal in size without focal abnormality.  Splenule noted. Adrenals/Urinary Tract: No adrenal nodule bilaterally. Bilateral kidneys enhance symmetrically. No hydronephrosis. No hydroureter. The urinary bladder is unremarkable. Stomach/Bowel: Stomach is within normal limits. No evidence of bowel wall thickening or dilatation. Colonic diverticulosis. Appendix appears normal. Vascular/Lymphatic: No abdominal aorta or iliac aneurysm. no abdominal, pelvic, or inguinal lymphadenopathy. Reproductive: Uterus and bilateral adnexa are unremarkable. Other: No intraperitoneal free fluid. No intraperitoneal free gas. No organized fluid collection. Musculoskeletal: No abdominal wall hernia or  abnormality. No suspicious lytic or blastic osseous lesions. No acute displaced fracture. IMPRESSION: 1. Colonic diverticulosis with no acute diverticulitis. 2. Tiny hiatal hernia. 3. Hepatomegaly. 4. Status post cholecystectomy. 5. Limited evaluation on this noncontrast study. Electronically Signed: By: Tish Frederickson M.D. On: 09/04/2023 02:03    Scheduled Meds:  allopurinol  300 mg Oral Daily   cholecalciferol  5,000 Units Oral Daily   fluticasone  2 spray Each Nare Daily   insulin aspart  0-15 Units Subcutaneous TID WC   insulin aspart  0-5 Units Subcutaneous QHS   insulin glargine-yfgn  10 Units Subcutaneous QHS   loratadine  10 mg Oral Daily   metoprolol succinate  100 mg Oral Daily   modafinil  100 mg Oral BID   montelukast  10 mg Oral Daily   pantoprazole  40 mg Oral BID   Continuous Infusions:   LOS: 0 days   Time spent: 40 minutes  Carollee Herter, DO  [facility]  09/05/2023, 12:38 PM

## 2023-09-05 NOTE — TOC Initial Note (Signed)
 Transition of Care [facility]) - Initial/Assessment Note    Patient Details  Name: Valerie [name] MRN: [id number] Date of Birth: Dec 24, 1960  Transition of [facility]) CM/SW Contact:    Lanier Clam, RN Phone Number: 09/05/2023, 10:39 AM  Clinical Narrative: d/c plan home.                  Expected Discharge Plan: Home/Self Care Barriers to Discharge: Continued Medical Work up   Patient Goals and CMS Choice Patient states their goals for this hospitalization and ongoing recovery are:: Home          Expected Discharge Plan and Services                                              Prior Living Arrangements/Services                       Activities of Daily Living   ADL Screening (condition at time of admission) Independently performs ADLs?: Yes (appropriate for developmental age) Is the patient deaf or have difficulty hearing?: No Does the patient have difficulty seeing, even when wearing glasses/contacts?: No Does the patient have difficulty concentrating, remembering, or making decisions?: No  Permission Sought/Granted                  Emotional Assessment              Admission diagnosis:  Rectal bleeding [K62.5] Gastrointestinal hemorrhage, unspecified gastrointestinal hemorrhage type [K92.2] Patient Active Problem List   Diagnosis Date Noted   Rectal bleeding 09/04/2023   Type 2 diabetes mellitus with chronic painful diabetic neuropathy (HCC) 09/04/2023   Dependence on CPAP ventilation 01/10/2023   Idiopathic chronic pancreatitis (HCC) 01/10/2023   Severe episode of recurrent major depressive disorder, without psychotic features (HCC) 01/10/2023   Other specified necrotizing vasculopathies (HCC) 01/10/2023   Reactive airways dysfunction syndrome, mild intermittent, uncomplicated (HCC) 01/10/2023   Narcotic drug use 02/07/2022   Excessive daytime sleepiness 02/07/2022   At risk for extreme obesity with alveolar hypoventilation  02/07/2022   HTN (hypertension) 04/12/2018   Chronic diastolic CHF (congestive heart failure) (HCC) 04/12/2018   Shortness of breath    Other bursal cyst, right shoulder, AC joint into trapezius 04/09/2018   Persistent hypersomnia 10/11/2017   HLA B27 (HLA B27 positive) 10/11/2017   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) 10/11/2017   Generalized edema 09/20/2017   Abdominal pain, chronic, generalized 09/20/2017   Anxiety state 09/20/2017   Chronic, continuous use of opioids 04/06/2017   GERD (gastroesophageal reflux disease) 04/06/2017   Cortisol deficiency (HCC) 01/31/2017   Multiple stiff joints 01/31/2017   Glucocorticoids and synthetic analogues causing adverse effect in therapeutic use 01/14/2017   Hypothalamic-adrenal dysfunction (HCC) 01/14/2017   Microscopic polyangiitis (HCC) 11/25/2016   Neuropathic pain 11/23/2016   P-ANCA and MPO antibodies positive 10/11/2016   Impaired glucose tolerance 09/25/2016   Lower GI bleed    History of ischemic colitis 09/24/2016   Colitis, regional, with rectal bleeding (HCC) 09/24/2016   Gastroparesis 09/18/2016   Flank pain 09/18/2016   Nausea without vomiting 09/18/2016   CKD (chronic kidney disease) stage 3, GFR 30-59 ml/min (HCC)    Chronic pain syndrome    Morbid obesity with body mass index (BMI) greater than or equal to 50 (HCC) 10/21/2012   OSA on CPAP  10/21/2012   Fibromyalgia 06/22/2012   Back pain 06/22/2012   Recurrent pancreatitis 06/22/2012   Depression 06/22/2012   Anemia 05/01/2012   Leukocytosis 04/28/2012   Colon polyp 08/01/2011   PTSD (post-traumatic stress disorder) 08/01/2011   Hypertension 08/01/2011   IBS (irritable bowel syndrome) 08/01/2011   PCP:  Porfirio Oar, PA Pharmacy:   [facility] [id number] [place], [place] - [address] [place] [address] Phone: [phone] Fax: [phone]  [facility] [place] - [address]  [address] [place] [address] Phone: [phone] Fax: [phone]  [facility] [id number] - [address], [place] - [address] [place] [address] Phone: [phone] Fax: [phone]     Social Drivers of Health (SDOH) Social History: SDOH Screenings   Food Insecurity: Food Insecurity Present (09/04/2023)  Housing: Low Risk  (09/04/2023)  Transportation Needs: No Transportation Needs (09/04/2023)  Utilities: Not At Risk (09/04/2023)  Depression (PHQ2-9): Low Risk  (08/08/2018)  Financial Resource Strain: Low Risk  (06/28/2023)   Received from [facility]  Physical Activity: Insufficiently Active (05/19/2023)   Received from [facility]  Social Connections: Moderately Integrated (09/04/2023)  Stress: No Stress Concern Present (05/21/2023)   Received from [facility]  Tobacco Use: Low Risk  (09/03/2023)   SDOH Interventions:     Readmission Risk Interventions     No data to display

## 2023-09-05 NOTE — Subjective & Objective (Signed)
 Pt seen and examined. No further rectal bleeding. GI has given clearance for discharge.

## 2023-09-05 NOTE — Care Management Obs Status (Signed)
 MEDICARE OBSERVATION STATUS NOTIFICATION   Patient Details  Name: ALIJAH HYDE MRN: [id number] Date of Birth: 1960/06/16   Medicare Observation Status Notification Given:  Yes    MahabirOlegario Messier, RN 09/05/2023, 10:21 AM

## 2023-10-08 ENCOUNTER — Encounter: Payer: Self-pay | Admitting: Neurology

## 2024-03-11 ENCOUNTER — Telehealth: Payer: Self-pay | Admitting: Neurology

## 2024-03-11 NOTE — Telephone Encounter (Signed)
 Pt called to cancel appt  due to PCP will be taking over care   Appt Canceled

## 2024-04-02 NOTE — Progress Notes (Signed)
 Patient: Valerie [name]    DOB: 03-12-1961, age 63 y.o. MRN: [id number]  PCP: Bernita Ned, PA-C     Assessment and Plan   1. Easy bruising (Primary) 2. Abdominal pain of multiple sites -     CT Abdomen Pelvis W IV Contrast; Future; Expected date: 04/03/2024 3. Diaphoresis 4. Cloudy urine -     Urinalysis; Future; Expected date: 04/02/2024    Discussed increased ease of bruising as part of the natural aging process, and reassured that the ecchymotic areas are not otherwise concerning.  Abdominal pain is primarily in the right upper quadrant, but is not tender in that area.  Palpation in the right upper quadrant causes discomfort in the right lower quadrant.  Await imaging and recommend additional evaluation of this concern with GI.   No follow-ups on file.  Risks, benefits, and alternatives of the medications and treatment plan prescribed today were discussed, and patient expressed understanding. Plan follow-up as discussed or as needed if any worsening symptoms or change in condition.  Patient voiced understanding of the treatment plan and agreed to attempt to comply.      Subjective   This a 63 y.o. female who presents with:     Patient presents with  . Flank Pain    Upper right quadrant  . Bleeding/Bruising    Noticed that there has been a lot more skin bruising that appears after barley brushing up against something      Chief complaint comments reviewed and discussed with patient in detail.  HPI   This 63 y.o. female presents for evaluation of easy bruising and RUQ abdominal pain.  Bruising  Reminds her of someone on blood thinners. 1 month ago, on the LEFT forearm. RIGHT forearm. Putting sheets on the bed, minimal bumping on objects while working on her [place].  Feeling rotten.  For the past month. Feeling a lot of pain on the RIGHT side of the body, shoulder to ankle. And RUQ.  She is status post a cholecystectomy.  PT is working on the  musculoskeletal aspect (shoulder, back, leg).  Therapist  yesterday, advised her that the pain doesn't seem psychological, and she should see me. Sudden sensation of being on edge, fight-or-flight. No identified triggers. Stresses in life seem to be calming down. Hydroxyzine , alprazolam  without significant benefit. Easily emotional, crying.  When drinking hot coffee, seems to aggravate the RUQ pain.  Hematemesis 03/08/2024. Has had 3 additional episodes. Cramping to the point of diaphoretic and like I'm about to pass out.   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   Saw GI. Replaced promethazine  with ondansetron  for nausea. Didn't think that repeat endoscopy was needed (had one about a year ago). Recent TMP-SMX treatment for UTI did cause some GI symptoms, thought possibly to contribute. Awoke drenched in sweat more frequently, last night was the worst episode.  Episode of substernal chest pain on Saturday while watching football. Had been having pain between the shoulder  blade and spine on the RIGHT.    Reviewed and updated this visit by provider: Tobacco  Allergies  Meds  Problems  Med Hx  Surg Hx  Fam Hx        Review of Systems  Constitutional:  Positive for diaphoresis and fatigue. Negative for chills and fever.  Cardiovascular:  Positive for chest pain. Negative for palpitations and leg swelling.  Gastrointestinal:  Positive for abdominal pain and nausea. Negative for abdominal distention, anal bleeding, blood in stool,  constipation, diarrhea, rectal pain and vomiting.  Musculoskeletal:  Positive for arthralgias, back pain and myalgias.  Neurological:  Positive for light-headedness.  Hematological:  Bruises/bleeds easily.  Psychiatric/Behavioral:  The patient is nervous/anxious.          Objective   Vitals:   04/02/24 1114  BP: 110/68  Patient Position: Sitting  Pulse: 83  Temp: 98.2 F (36.8 C)  TempSrc: Temporal  Height: 5' 5 (1.651 m)  Weight: (!) 319 lb 3.2 oz (144.8 kg)  SpO2: 98%  BMI (Calculated): 53.1    Physical Exam Constitutional:      General: She is awake. She is not in acute distress.    Appearance: Normal appearance. She is well-developed and well-groomed. She is not ill-appearing.  HENT:     Head: Normocephalic and atraumatic.     Right Ear: Hearing normal.     Left Ear: Hearing normal.  Eyes:     General: Lids are normal. Vision grossly intact. No scleral icterus.       Right eye: No discharge or hordeolum.        Left eye: No discharge or hordeolum.  Neck:     Thyroid : No thyroid  mass or thyromegaly.     Trachea: Phonation normal.  Cardiovascular:     Rate and Rhythm: Normal rate and regular rhythm. No extrasystoles are present.    Pulses:          Radial pulses are 2+ on the right side and 2+ on the left side.     Heart sounds: Normal heart sounds. No murmur heard.    No friction rub. No gallop.  Musculoskeletal:     Cervical back: Full passive range of motion without pain and neck supple.  Pulmonary:     Effort: Pulmonary effort is normal.     Breath sounds: Normal breath sounds.  Abdominal:     General: Bowel sounds are normal. There is no distension.     Palpations: Abdomen is soft. There is no mass.     Tenderness: There is no abdominal tenderness. There is no guarding or rebound.     Comments: Palpation in the right upper quadrant causes discomfort in the right lower quadrant.  The right upper quadrant is not tender.  Lymphadenopathy:     Cervical: No  cervical adenopathy.  Skin:    General: Skin is warm and dry.  Neurological:     Mental Status: She is alert and oriented to person, place, and time.  Psychiatric:        Attention and Perception: Attention and perception normal.        Mood and Affect: Mood and affect normal.        Speech: Speech normal.        Behavior: Behavior normal. Behavior is cooperative.        Bernita CANDIE Ned, PA-C

## 2024-04-03 ENCOUNTER — Ambulatory Visit: Payer: Medicare HMO | Admitting: Neurology

## 2024-04-04 ENCOUNTER — Emergency Department (HOSPITAL_BASED_OUTPATIENT_CLINIC_OR_DEPARTMENT_OTHER)

## 2024-04-04 ENCOUNTER — Emergency Department (HOSPITAL_BASED_OUTPATIENT_CLINIC_OR_DEPARTMENT_OTHER)
Admission: EM | Admit: 2024-04-04 | Discharge: 2024-04-04 | Disposition: A | Discharge status: Home / Self Care | Attending: Emergency Medicine | Admitting: Emergency Medicine

## 2024-04-04 ENCOUNTER — Encounter (HOSPITAL_BASED_OUTPATIENT_CLINIC_OR_DEPARTMENT_OTHER): Payer: Self-pay | Admitting: *Deleted

## 2024-04-04 DIAGNOSIS — R1011 Right upper quadrant pain: Secondary | ICD-10-CM | POA: Diagnosis present

## 2024-04-04 DIAGNOSIS — R1013 Epigastric pain: Secondary | ICD-10-CM | POA: Diagnosis not present

## 2024-04-04 DIAGNOSIS — R109 Unspecified abdominal pain: Secondary | ICD-10-CM

## 2024-04-04 LAB — URINALYSIS, ROUTINE W REFLEX MICROSCOPIC
Bilirubin Urine: NEGATIVE
Glucose, UA: NEGATIVE mg/dL
Hgb urine dipstick: NEGATIVE
Ketones, ur: NEGATIVE mg/dL
Leukocytes,Ua: NEGATIVE
Nitrite: NEGATIVE
Protein, ur: NEGATIVE mg/dL
Specific Gravity, Urine: <1.005 — ABNORMAL LOW (ref 1.005–1.030)
pH: 6.5 (ref 5.0–8.0)

## 2024-04-04 LAB — COMPREHENSIVE METABOLIC PANEL WITH GFR
ALT: 18 U/L (ref 0–44)
AST: 18 U/L (ref 15–41)
Albumin: 4.5 g/dL (ref 3.5–5.0)
Alkaline Phosphatase: 59 U/L (ref 38–126)
Anion gap: 9 (ref 5–15)
BUN: 13 mg/dL (ref 8–23)
CO2: 28 mmol/L (ref 22–32)
Calcium: 10.9 mg/dL — ABNORMAL HIGH (ref 8.9–10.3)
Chloride: 103 mmol/L (ref 98–111)
Creatinine, Ser: 1.04 mg/dL — ABNORMAL HIGH (ref 0.44–1.00)
GFR, Estimated: >60 mL/min (ref >60)
Glucose, Bld: 100 mg/dL — ABNORMAL HIGH (ref 70–99)
Potassium: 4.7 mmol/L (ref 3.5–5.1)
Sodium: 140 mmol/L (ref 135–145)
Total Bilirubin: 0.4 mg/dL (ref 0.0–1.2)
Total Protein: 7.9 g/dL (ref 6.5–8.1)

## 2024-04-04 LAB — CBC
HCT: 41.5 % (ref 36.0–46.0)
Hemoglobin: 13.6 g/dL (ref 12.0–15.0)
MCH: 30.9 pg (ref 26.0–34.0)
MCHC: 32.8 g/dL (ref 30.0–36.0)
MCV: 94.3 fL (ref 80.0–100.0)
Platelets: 321 K/uL (ref 150–400)
RBC: 4.4 MIL/uL (ref 3.87–5.11)
RDW: 14.1 % (ref 11.5–15.5)
WBC: 8.7 K/uL (ref 4.0–10.5)
nRBC: 0 % (ref 0.0–0.2)

## 2024-04-04 LAB — LIPASE, BLOOD: Lipase: 42 U/L (ref 11–51)

## 2024-04-04 LAB — TROPONIN T, HIGH SENSITIVITY: Troponin T High Sensitivity: <15 ng/L (ref 0–19)

## 2024-04-04 MED ORDER — IOHEXOL 350 MG/ML SOLN
100.0000 mL | Freq: Once | INTRAVENOUS | Status: AC | PRN
  Administered 2024-04-04: 83 mL via INTRAVENOUS

## 2024-04-04 MED ORDER — SODIUM CHLORIDE 0.9 % IV BOLUS
1000.0000 mL | Freq: Once | INTRAVENOUS | Status: AC
  Administered 2024-04-04: 1000 mL via INTRAVENOUS

## 2024-04-04 NOTE — ED Triage Notes (Addendum)
 Pt to ED reporting RUQ abd pain, cloudy urine, nausea and dry heaves since Tuesday. PCP did a urinalysis that showed white cells and ordered a CT. Patient reports CT is on Monday but pain has worsened and she does not feel like she can wait. Hx of acute renal failure.   Patient also reporting new onset of diarrhea with hx of upper GI bleed.

## 2024-04-04 NOTE — ED Provider Notes (Signed)
 Lead EMERGENCY DEPARTMENT AT [facility] Provider Note   CSN: [id number] Arrival date & time: 04/04/24  1554     Patient presents with: Abdominal Pain   Valerie [name] is a 63 y.o. female.   Patient here with epigastric pain right upper abdominal pain.  Has had gallbladder removed in the past.  Reflux history.  Pain on and off for the last several days.  She has some cloudy urine recently.  She denies any nausea vomiting.  Nothing makes it worse or better.  She does not think it is related to eating.  She denies any chest pain or shortness of breath.  No history of kidney stones.  Denies any fevers or chills.  She has not had any black or bloody stools.  May be actually loose stools here recently.  The history is provided by the patient.       Prior to Admission medications   Medication Sig Start Date End Date Taking? Authorizing Provider  albuterol  (PROAIR  HFA) 108 (90 Base) MCG/ACT inhaler Inhale 2 puffs into the lungs every 4 (four) hours as needed for wheezing or shortness of breath. 04/24/18   Loreli Elyn SAILOR, MD  allopurinol  (ZYLOPRIM ) 300 MG tablet TAKE 1/2 TABLET BY MOUTH ONCE A DAY Patient taking differently: Take 300 mg by mouth daily. 08/08/18   Loreli Elyn SAILOR, MD  ALPRAZolam  (XANAX ) 1 MG tablet Take 1 tablet (1 mg total) by mouth 3 (three) times daily as needed. for anxiety 08/08/18   Loreli Elyn SAILOR, MD  baclofen  (LIORESAL ) 10 MG tablet Take 10 mg by mouth 3 (three) times daily as needed for muscle spasms.     [provider]  BD PEN NEEDLE NANO 2ND GEN 32G X 4 MM MISC  08/09/23   [provider]  benazepril  (LOTENSIN ) 40 MG tablet Take 1 tablet (40 mg total) by mouth daily. 08/08/18   Loreli Elyn SAILOR, MD  CARAFATE  1 GM/10ML suspension TAKE 10 ML'S BY MOUTH FOUR TIMES A DAY AS NEEDED FOR STOMACH PAIN Patient taking differently: Take 1 g by mouth 3 (three) times daily as needed (burning in esophagus). 05/06/18   Loreli Elyn SAILOR, MD  carboxymethylcellulose  (REFRESH PLUS) 0.5 % SOLN Place 1 drop into both eyes 3 (three) times daily as needed (dry eyes).    [provider]  cetirizine  (ZYRTEC ) 10 MG tablet Take 10 mg by mouth daily.     [provider]  Cholecalciferol  (VITAMIN D3) 5000 units CAPS Take 5,000 Units by mouth daily.     [provider]  dexlansoprazole (DEXILANT) 60 MG capsule Take 60 mg by mouth daily.     [provider]  fexofenadine (ALLEGRA) 180 MG tablet Take 180 mg by mouth daily.    [provider]  fluticasone  (FLONASE ) 50 MCG/ACT nasal spray PLACE 2 SPRAYS INTO BOTH NOSTRILS DAILY 11/28/17   Loreli Elyn SAILOR, MD  furosemide  (LASIX ) 20 MG tablet Take 20 mg by mouth daily as needed (fluid retention.).    [provider]  HYDROcodone -acetaminophen  (NORCO) 10-325 MG per tablet Take 1-2 tablets by mouth every 6 (six) hours as needed for moderate pain (pain.).     [provider]  hydrOXYzine  (ATARAX /VISTARIL ) 10 MG tablet Take 1-3 tablets (10-30 mg total) by mouth 3 (three) times daily as needed for anxiety. 08/08/18   Loreli Elyn SAILOR, MD  ibuprofen  (ADVIL ,MOTRIN ) 600 MG tablet Take 600 mg by mouth daily as needed (migraines).    [provider]  Insulin  Glargine (BASAGLAR  KWIKPEN) 100 UNIT/ML Inject 40 Units into the skin every evening. 03/09/20   [provider]  insulin  regular (NOVOLIN R) 100 units/mL injection as directed. CHECK GLUCOSE EVERY 4 HOURS. IF GLUCOSE  <150 GIVE NO INSULIN ; IF 150-199 GIVE 2 UNITS [place]; IF 200-249 GIVE 4 UNITS; IF 250-299 GIVE 6 UNITS; IF 300-349 GIVE 8 UNITS; IF 350-399 GIVE 10 UNITS; IF 400 AND ABOVE GIVE 12 UNITS 02/25/20   [provider]  Insulin  Syringe-Needle U-100 30G X 1/2 1 ML MISC USE TO GIVE INSULIN  FOUR TIMES A DAY WITH SLIDING SCALE 08/24/23   [provider]  lidocaine -prilocaine (EMLA) cream Apply 1 Application topically once. 05/22/23   [provider]  Melatonin 5 MG TABS Take 10 mg by mouth at  bedtime as needed (sleep).    [provider]  metoprolol  succinate (TOPROL -XL) 100 MG 24 hr tablet Take 1 tablet (100 mg total) by mouth daily. Take with or immediately following a meal. 08/08/18   Loreli Elyn SAILOR, MD  modafinil  (PROVIGIL ) 200 MG tablet Take 0.5 tablets (100 mg total) by mouth 2 (two) times daily. 04/03/23   Dohmeier, Dedra, MD  montelukast  (SINGULAIR ) 10 MG tablet TAKE ONE TABLET BY MOUTH EVERY NIGHT AT BEDTIME Patient taking differently: Take 10 mg by mouth daily. 06/06/18   Loreli Elyn SAILOR, MD  Multiple Vitamins-Minerals (MULTIVITAMIN PO) Take 1 tablet by mouth daily.    [provider]  Olopatadine  HCl (PAZEO) 0.7 % SOLN Apply 1 drop to eye daily as needed (allergies). 08/08/18   Loreli Elyn SAILOR, MD  ondansetron  (ZOFRAN ) 4 MG tablet Take 1 tablet (4 mg total) by mouth every 8 (eight) hours as needed for nausea or vomiting. 11/18/16   Gretta Ozell CROME, PA-C  Pancrelipase , Lip-Prot-Amyl, 24000-76000 units CPEP Take 24,000-72,000 Units by mouth 3 (three) times daily as needed (for pancreatitis).     [provider]  promethazine  (PHENERGAN ) 25 MG tablet Take 1 tablet (25 mg total) by mouth every 6 (six) hours as needed for nausea or vomiting. 08/08/18   Loreli Elyn SAILOR, MD  torsemide  (DEMADEX ) 10 MG tablet Take 10 mg by mouth daily as needed (fluid). 03/18/21   [provider]  triamcinolone cream (KENALOG) 0.1 % Apply 1 Application topically as needed (Skin irritation). 06/12/23   [provider]    Allergies: Dilaudid [hydromorphone hcl], Effexor [venlafaxine], Pregabalin, Jardiance [empagliflozin], Adhesive [tape], Metoclopramide hcl, Neurontin [gabapentin], Serotonin reuptake inhibitors (ssris), and Metformin and related    Review of Systems  Updated Vital Signs BP 135/82   Pulse 69   Temp 97.6 F (36.4 C)   Resp 14   SpO2 100%   Physical Exam Vitals and nursing note reviewed.  Constitutional:      General: She is not in acute distress.     Appearance: She is well-developed. She is not ill-appearing.  HENT:     Head: Normocephalic and atraumatic.  Eyes:     Extraocular Movements: Extraocular movements intact.     Conjunctiva/sclera: Conjunctivae normal.     Pupils: Pupils are equal, round, and reactive to light.  Cardiovascular:     Rate and Rhythm: Normal rate and regular rhythm.     Heart sounds: Normal heart sounds. No murmur heard. Pulmonary:     Effort: Pulmonary effort is normal. No respiratory distress.     Breath sounds: Normal breath sounds.  Abdominal:     General: Abdomen is flat.     Palpations: Abdomen is soft.  Tenderness: There is abdominal tenderness in the right upper quadrant and epigastric area.  Musculoskeletal:        General: No swelling.     Cervical back: Neck supple.  Skin:    General: Skin is warm and dry.     Capillary Refill: Capillary refill takes less than 2 seconds.  Neurological:     Mental Status: She is alert.  Psychiatric:        Mood and Affect: Mood normal.     (all labs ordered are listed, but only abnormal results are displayed) Labs Reviewed  COMPREHENSIVE METABOLIC PANEL WITH GFR - Abnormal; Notable for the following components:      Result Value   Glucose, Bld 100 (*)    Creatinine, Ser 1.04 (*)    Calcium  10.9 (*)    All other components within normal limits  URINALYSIS, ROUTINE W REFLEX MICROSCOPIC - Abnormal; Notable for the following components:   Color, Urine COLORLESS (*)    Specific Gravity, Urine <1.005 (*)    All other components within normal limits  LIPASE, BLOOD  CBC  TROPONIN T, HIGH SENSITIVITY    EKG: EKG Interpretation Date/Time:  Friday April 04 2024 17:19:18 EDT Ventricular Rate:  68 PR Interval:  164 QRS Duration:  98 QT Interval:  400 QTC Calculation: 426 R Axis:   6  Text Interpretation: Sinus rhythm Low voltage, precordial leads Confirmed by Ruthe Cornet [phone]) on 04/04/2024 5:24:13 PM  Radiology: CT Angio Chest/Abd/Pel  for Dissection W and/or Wo Contrast Result Date: 04/04/2024 EXAM: CTA CHEST, ABDOMEN AND PELVIS WITHOUT AND WITH CONTRAST 04/04/2024 05:37:59 PM TECHNIQUE: CTA of the chest was performed without and with the administration of 83 mL of intravenous Omnipaque  350. CTA of the abdomen and pelvis was performed without and with the administration of 83 mL of intravenous Omnipaque  350. Multiplanar reformatted images are provided for review. MIP images are provided for review. Automated exposure control, iterative reconstruction, and/or weight based adjustment of the mA/kV was utilized to reduce the radiation dose to as low as reasonably achievable. COMPARISON: None available. CLINICAL HISTORY: Acute aortic syndrome (AAS) suspected. IV contrast ; total was only 83 mL of omnipaque  350. Best images acquired. Acute aortic syndrome (AAS) suspected. FINDINGS: VASCULATURE: AORTA: Noncontrast imaging demonstrates no intramural hematoma within the thoracic aorta. Contrast-enhanced imaging demonstrates no aortic dissection or aneurysm. No abdominal aortic aneurysm. PULMONARY ARTERIES: No pulmonary embolism with the limits of this exam. GREAT VESSELS OF AORTIC ARCH: No acute finding. No dissection. No arterial occlusion or significant stenosis. CELIAC TRUNK: No acute finding. No occlusion or significant stenosis. SUPERIOR MESENTERIC ARTERY: No acute finding. No occlusion or significant stenosis. INFERIOR MESENTERIC ARTERY: No acute finding. No occlusion or significant stenosis. RENAL ARTERIES: No acute finding. No occlusion or significant stenosis. ILIAC ARTERIES: No acute finding. No occlusion or significant stenosis. CHEST: MEDIASTINUM: No mediastinal lymphadenopathy. The heart and pericardium demonstrate no acute abnormality. No pericardial fluid. No mediastinal hematoma. LUNGS AND PLEURA: The lungs are without acute process. No focal consolidation or pulmonary edema. No evidence of pleural effusion or pneumothorax. THORACIC  BONES AND SOFT TISSUES: No acute bone or soft tissue abnormality. ABDOMEN AND PELVIS: LIVER: The liver is unremarkable. GALLBLADDER AND BILE DUCTS: Post cholecystectomy. No biliary ductal dilatation. SPLEEN: The spleen is unremarkable. PANCREAS: The pancreas is unremarkable. ADRENAL GLANDS: Bilateral adrenal glands demonstrate no acute abnormality. KIDNEYS, URETERS AND BLADDER: No stones in the kidneys or ureters. No hydronephrosis. No perinephric or periureteral stranding. Urinary bladder is unremarkable.  GI AND BOWEL: Stomach and duodenal sweep demonstrate no acute abnormality. There is no bowel obstruction. No abnormal bowel wall thickening or distension. Normal appendix. REPRODUCTIVE: Uterus and adnexa unremarkable. PERITONEUM AND RETROPERITONEUM: No ascites or free air. LYMPH NODES: No lymphadenopathy. ABDOMINAL BONES AND SOFT TISSUES: No acute abnormality of the bones. No acute soft tissue abnormality. IMPRESSION: 1. No evidence of acute aortic syndrome, including intramural hematoma, aortic dissection, or aneurysm. 2. No acute findings Electronically signed by: Norleen Boxer MD 04/04/2024 06:21 PM EDT RP Workstation: [id number]     Procedures   Medications Ordered in the ED  sodium chloride  0.9 % bolus 1,000 mL (0 mLs Intravenous Stopped 04/04/24 1834)  iohexol  (OMNIPAQUE ) 350 MG/ML injection 100 mL (83 mLs Intravenous Contrast Given 04/04/24 1726)                                    Medical Decision Making Amount and/or Complexity of Data Reviewed Labs: ordered. Radiology: ordered.  Risk Prescription drug management.   Valerie [name] is here with abdominal pain.  Unremarkable vitals.  No fever.  History of gallbladder removal.  History of diabetes.  Differential diagnosis could be gastritis kidney stone colitis less likely bowel obstruction less likely ACS.  I have no concern for PE.  Seems less likely to be dissection but will get labs including troponin EKG CBC CMP lipase urinalysis  and CT dissection study and reevaluate.  Lab work shows a significant leukocytosis anemia or electrolyte abnormality.  Urinalysis negative for infection.  Troponin negative.  CT dissection study with no acute findings.  Overall I do not see any emergent process today.  Recommend Tylenol  for pain.  Follow-up with primary care.  Discharged in good condition.  I reviewed interpret labs and imaging.  This chart was dictated using voice recognition software.  Despite best efforts to proofread,  errors can occur which can change the documentation meaning.      Final diagnoses:  Abdominal pain, unspecified abdominal location    ED Discharge Orders     None          Ruthe Cornet, DO 04/04/24 1845
